# Patient Record
Sex: Male | Born: 1935
Health system: Southern US, Community
[De-identification: ages and names within clinical notes are randomized; demographics above are authoritative.]

## PROBLEM LIST (undated history)

## (undated) DIAGNOSIS — J449 Chronic obstructive pulmonary disease, unspecified: Secondary | ICD-10-CM

## (undated) DIAGNOSIS — G459 Transient cerebral ischemic attack, unspecified: Secondary | ICD-10-CM

## (undated) DIAGNOSIS — I2510 Atherosclerotic heart disease of native coronary artery without angina pectoris: Secondary | ICD-10-CM

## (undated) DIAGNOSIS — N183 Chronic kidney disease, stage 3 unspecified: Secondary | ICD-10-CM

## (undated) DIAGNOSIS — I4891 Unspecified atrial fibrillation: Secondary | ICD-10-CM

## (undated) DIAGNOSIS — Z952 Presence of prosthetic heart valve: Secondary | ICD-10-CM

## (undated) DIAGNOSIS — Z8546 Personal history of malignant neoplasm of prostate: Secondary | ICD-10-CM

## (undated) DIAGNOSIS — E041 Nontoxic single thyroid nodule: Secondary | ICD-10-CM

## (undated) DIAGNOSIS — D119 Benign neoplasm of major salivary gland, unspecified: Secondary | ICD-10-CM

## (undated) DIAGNOSIS — J9611 Chronic respiratory failure with hypoxia: Secondary | ICD-10-CM

## (undated) DIAGNOSIS — J189 Pneumonia, unspecified organism: Secondary | ICD-10-CM

## (undated) DIAGNOSIS — I639 Cerebral infarction, unspecified: Secondary | ICD-10-CM

## (undated) DIAGNOSIS — D696 Thrombocytopenia, unspecified: Secondary | ICD-10-CM

## (undated) DIAGNOSIS — R590 Localized enlarged lymph nodes: Secondary | ICD-10-CM

## (undated) DIAGNOSIS — I10 Essential (primary) hypertension: Secondary | ICD-10-CM

## (undated) DIAGNOSIS — D3500 Benign neoplasm of unspecified adrenal gland: Secondary | ICD-10-CM

## (undated) DIAGNOSIS — R911 Solitary pulmonary nodule: Secondary | ICD-10-CM

## (undated) DIAGNOSIS — K5790 Diverticulosis of intestine, part unspecified, without perforation or abscess without bleeding: Secondary | ICD-10-CM

## (undated) DIAGNOSIS — I5032 Chronic diastolic (congestive) heart failure: Secondary | ICD-10-CM

## (undated) DIAGNOSIS — C801 Malignant (primary) neoplasm, unspecified: Secondary | ICD-10-CM

## (undated) DIAGNOSIS — E785 Hyperlipidemia, unspecified: Secondary | ICD-10-CM

## (undated) DIAGNOSIS — E119 Type 2 diabetes mellitus without complications: Secondary | ICD-10-CM

## (undated) DIAGNOSIS — I272 Pulmonary hypertension, unspecified: Secondary | ICD-10-CM

## (undated) HISTORY — DX: Chronic kidney disease, stage 3 (moderate): N18.3

## (undated) HISTORY — DX: Unspecified atrial fibrillation: I48.91

## (undated) HISTORY — PX: CATARACT EXTRACTION, BILATERAL: SHX1313

## (undated) HISTORY — DX: Transient cerebral ischemic attack, unspecified: G45.9

## (undated) HISTORY — PX: TRANSTHORACIC ECHOCARDIOGRAM: SHX275

## (undated) HISTORY — DX: Essential (primary) hypertension: I10

## (undated) HISTORY — DX: Type 2 diabetes mellitus without complications: E11.9

## (undated) HISTORY — DX: Cerebral infarction, unspecified: I63.9

## (undated) HISTORY — DX: Chronic obstructive pulmonary disease, unspecified: J44.9

## (undated) HISTORY — DX: Diverticulosis of intestine, part unspecified, without perforation or abscess without bleeding: K57.90

## (undated) HISTORY — DX: Presence of prosthetic heart valve: Z95.2

## (undated) HISTORY — DX: Benign neoplasm of major salivary gland, unspecified: D11.9

## (undated) HISTORY — DX: Pulmonary hypertension, unspecified: I27.20

## (undated) HISTORY — DX: Localized enlarged lymph nodes: R59.0

## (undated) HISTORY — DX: Chronic kidney disease, stage 3 unspecified: N18.30

## (undated) HISTORY — DX: Thrombocytopenia, unspecified: D69.6

## (undated) HISTORY — DX: Pneumonia, unspecified organism: J18.9

## (undated) HISTORY — DX: Chronic diastolic (congestive) heart failure: I50.32

## (undated) HISTORY — PX: FLEXIBLE SIGMOIDOSCOPY: SHX1649

## (undated) HISTORY — PX: SALIVARY GLAND SURGERY: SHX768

## (undated) HISTORY — DX: Atherosclerotic heart disease of native coronary artery without angina pectoris: I25.10

## (undated) HISTORY — DX: Solitary pulmonary nodule: R91.1

## (undated) HISTORY — DX: Hyperlipidemia, unspecified: E78.5

## (undated) HISTORY — DX: Benign neoplasm of unspecified adrenal gland: D35.00

## (undated) HISTORY — DX: Personal history of malignant neoplasm of prostate: Z85.46

## (undated) HISTORY — DX: Chronic respiratory failure with hypoxia: J96.11

## (undated) HISTORY — PX: INGUINAL HERNIA REPAIR: SHX194

## (undated) HISTORY — DX: Nontoxic single thyroid nodule: E04.1

---

## 1999-07-28 ENCOUNTER — Ambulatory Visit (HOSPITAL_COMMUNITY): Admission: RE | Admit: 1999-07-28 | Discharge: 1999-07-28 | Payer: Self-pay | Admitting: Internal Medicine

## 1999-07-28 ENCOUNTER — Encounter: Payer: Self-pay | Admitting: Internal Medicine

## 1999-11-18 ENCOUNTER — Ambulatory Visit (HOSPITAL_COMMUNITY): Admission: RE | Admit: 1999-11-18 | Discharge: 1999-11-18 | Payer: Self-pay | Admitting: Internal Medicine

## 1999-11-18 ENCOUNTER — Encounter: Payer: Self-pay | Admitting: Internal Medicine

## 1999-11-30 ENCOUNTER — Ambulatory Visit (HOSPITAL_BASED_OUTPATIENT_CLINIC_OR_DEPARTMENT_OTHER): Admission: RE | Admit: 1999-11-30 | Discharge: 1999-11-30 | Payer: Self-pay | Admitting: General Surgery

## 2002-08-03 ENCOUNTER — Encounter: Payer: Self-pay | Admitting: *Deleted

## 2002-08-03 ENCOUNTER — Observation Stay (HOSPITAL_COMMUNITY): Admission: EM | Admit: 2002-08-03 | Discharge: 2002-08-04 | Payer: Self-pay | Admitting: *Deleted

## 2002-11-06 ENCOUNTER — Encounter: Admission: RE | Admit: 2002-11-06 | Discharge: 2002-11-06 | Payer: Self-pay | Admitting: Internal Medicine

## 2002-11-06 ENCOUNTER — Encounter: Payer: Self-pay | Admitting: Internal Medicine

## 2003-06-20 HISTORY — PX: PROSTATECTOMY: SHX69

## 2003-07-10 ENCOUNTER — Encounter: Payer: Self-pay | Admitting: Urology

## 2003-07-14 ENCOUNTER — Encounter (INDEPENDENT_AMBULATORY_CARE_PROVIDER_SITE_OTHER): Payer: Self-pay

## 2003-07-14 ENCOUNTER — Inpatient Hospital Stay (HOSPITAL_COMMUNITY): Admission: RE | Admit: 2003-07-14 | Discharge: 2003-07-17 | Payer: Self-pay | Admitting: Urology

## 2004-09-14 ENCOUNTER — Ambulatory Visit: Payer: Self-pay | Admitting: Internal Medicine

## 2004-09-14 ENCOUNTER — Inpatient Hospital Stay (HOSPITAL_COMMUNITY): Admission: AD | Admit: 2004-09-14 | Discharge: 2004-09-29 | Payer: Self-pay | Admitting: Internal Medicine

## 2004-09-14 ENCOUNTER — Ambulatory Visit: Payer: Self-pay | Admitting: Cardiology

## 2004-09-15 ENCOUNTER — Encounter: Payer: Self-pay | Admitting: Cardiology

## 2004-09-22 ENCOUNTER — Encounter (INDEPENDENT_AMBULATORY_CARE_PROVIDER_SITE_OTHER): Payer: Self-pay | Admitting: *Deleted

## 2004-09-22 HISTORY — PX: MITRAL VALVE REPAIR: SHX2039

## 2004-10-20 ENCOUNTER — Ambulatory Visit: Payer: Self-pay | Admitting: Cardiology

## 2004-11-01 ENCOUNTER — Ambulatory Visit: Payer: Self-pay | Admitting: Cardiology

## 2004-11-01 ENCOUNTER — Ambulatory Visit: Payer: Self-pay

## 2004-11-01 ENCOUNTER — Ambulatory Visit: Payer: Self-pay | Admitting: Internal Medicine

## 2004-11-08 ENCOUNTER — Ambulatory Visit: Payer: Self-pay | Admitting: *Deleted

## 2004-11-18 ENCOUNTER — Ambulatory Visit: Payer: Self-pay | Admitting: Cardiology

## 2004-12-06 ENCOUNTER — Ambulatory Visit: Payer: Self-pay | Admitting: Internal Medicine

## 2004-12-09 ENCOUNTER — Ambulatory Visit: Payer: Self-pay | Admitting: Cardiology

## 2004-12-16 ENCOUNTER — Ambulatory Visit: Payer: Self-pay | Admitting: Internal Medicine

## 2004-12-23 ENCOUNTER — Ambulatory Visit: Payer: Self-pay | Admitting: Internal Medicine

## 2005-01-13 ENCOUNTER — Encounter
Admission: RE | Admit: 2005-01-13 | Discharge: 2005-01-13 | Payer: Self-pay | Admitting: Thoracic Surgery (Cardiothoracic Vascular Surgery)

## 2005-01-25 ENCOUNTER — Ambulatory Visit: Payer: Self-pay | Admitting: Cardiology

## 2005-02-04 ENCOUNTER — Ambulatory Visit: Payer: Self-pay | Admitting: Cardiovascular Disease

## 2005-02-08 ENCOUNTER — Ambulatory Visit: Payer: Self-pay | Admitting: Internal Medicine

## 2005-02-11 ENCOUNTER — Emergency Department (HOSPITAL_COMMUNITY): Admission: EM | Admit: 2005-02-11 | Discharge: 2005-02-11 | Payer: Self-pay | Admitting: Emergency Medicine

## 2005-02-12 ENCOUNTER — Ambulatory Visit: Payer: Self-pay | Admitting: Internal Medicine

## 2005-02-13 ENCOUNTER — Inpatient Hospital Stay (HOSPITAL_COMMUNITY): Admission: EM | Admit: 2005-02-13 | Discharge: 2005-03-01 | Payer: Self-pay | Admitting: Emergency Medicine

## 2005-02-13 ENCOUNTER — Ambulatory Visit: Payer: Self-pay | Admitting: Physical Medicine & Rehabilitation

## 2005-02-13 ENCOUNTER — Ambulatory Visit: Payer: Self-pay | Admitting: Internal Medicine

## 2005-02-15 ENCOUNTER — Ambulatory Visit: Payer: Self-pay | Admitting: Oncology

## 2005-02-16 ENCOUNTER — Ambulatory Visit: Payer: Self-pay | Admitting: Pulmonary Disease

## 2005-02-16 ENCOUNTER — Encounter: Payer: Self-pay | Admitting: Cardiology

## 2005-02-18 ENCOUNTER — Encounter (INDEPENDENT_AMBULATORY_CARE_PROVIDER_SITE_OTHER): Payer: Self-pay | Admitting: *Deleted

## 2005-02-25 ENCOUNTER — Ambulatory Visit: Payer: Self-pay | Admitting: Cardiology

## 2005-03-01 ENCOUNTER — Inpatient Hospital Stay (HOSPITAL_COMMUNITY)
Admission: RE | Admit: 2005-03-01 | Discharge: 2005-03-08 | Payer: Self-pay | Admitting: Physical Medicine & Rehabilitation

## 2005-03-01 ENCOUNTER — Ambulatory Visit: Payer: Self-pay | Admitting: Physical Medicine & Rehabilitation

## 2005-03-18 ENCOUNTER — Ambulatory Visit: Payer: Self-pay | Admitting: Cardiology

## 2005-03-24 ENCOUNTER — Ambulatory Visit: Payer: Self-pay | Admitting: Internal Medicine

## 2005-03-25 ENCOUNTER — Encounter
Admission: RE | Admit: 2005-03-25 | Discharge: 2005-03-25 | Payer: Self-pay | Admitting: Thoracic Surgery (Cardiothoracic Vascular Surgery)

## 2005-03-30 ENCOUNTER — Ambulatory Visit: Payer: Self-pay | Admitting: Infectious Diseases

## 2005-03-30 ENCOUNTER — Ambulatory Visit: Payer: Self-pay | Admitting: Internal Medicine

## 2005-04-04 ENCOUNTER — Ambulatory Visit: Payer: Self-pay | Admitting: Infectious Diseases

## 2005-04-05 ENCOUNTER — Ambulatory Visit: Payer: Self-pay | Admitting: Internal Medicine

## 2005-04-07 ENCOUNTER — Ambulatory Visit: Payer: Self-pay | Admitting: Cardiology

## 2005-04-11 ENCOUNTER — Ambulatory Visit: Payer: Self-pay | Admitting: Infectious Diseases

## 2005-04-11 ENCOUNTER — Encounter: Payer: Self-pay | Admitting: Cardiology

## 2005-04-11 ENCOUNTER — Ambulatory Visit (HOSPITAL_COMMUNITY): Admission: RE | Admit: 2005-04-11 | Discharge: 2005-04-11 | Payer: Self-pay | Admitting: Cardiology

## 2005-04-11 ENCOUNTER — Ambulatory Visit: Payer: Self-pay | Admitting: Cardiology

## 2005-04-12 ENCOUNTER — Ambulatory Visit: Payer: Self-pay | Admitting: Internal Medicine

## 2005-04-18 ENCOUNTER — Ambulatory Visit: Payer: Self-pay | Admitting: Cardiology

## 2005-04-21 ENCOUNTER — Ambulatory Visit: Payer: Self-pay | Admitting: Cardiology

## 2005-05-02 ENCOUNTER — Ambulatory Visit: Payer: Self-pay | Admitting: Cardiology

## 2005-05-16 ENCOUNTER — Ambulatory Visit: Payer: Self-pay

## 2005-05-16 ENCOUNTER — Ambulatory Visit: Payer: Self-pay | Admitting: Cardiology

## 2005-06-01 ENCOUNTER — Ambulatory Visit: Payer: Self-pay | Admitting: Infectious Diseases

## 2005-06-08 ENCOUNTER — Ambulatory Visit: Payer: Self-pay | Admitting: Internal Medicine

## 2005-06-13 ENCOUNTER — Ambulatory Visit: Payer: Self-pay | Admitting: Internal Medicine

## 2005-06-22 ENCOUNTER — Ambulatory Visit: Payer: Self-pay | Admitting: Cardiology

## 2005-06-27 ENCOUNTER — Encounter: Admission: RE | Admit: 2005-06-27 | Discharge: 2005-06-27 | Payer: Self-pay | Admitting: Internal Medicine

## 2005-07-06 ENCOUNTER — Ambulatory Visit: Payer: Self-pay | Admitting: Cardiology

## 2005-07-09 ENCOUNTER — Emergency Department (HOSPITAL_COMMUNITY): Admission: EM | Admit: 2005-07-09 | Discharge: 2005-07-09 | Payer: Self-pay | Admitting: Emergency Medicine

## 2005-07-10 ENCOUNTER — Ambulatory Visit (HOSPITAL_COMMUNITY): Admission: RE | Admit: 2005-07-10 | Discharge: 2005-07-10 | Payer: Self-pay | Admitting: Emergency Medicine

## 2005-07-13 ENCOUNTER — Ambulatory Visit: Payer: Self-pay | Admitting: Internal Medicine

## 2005-07-18 ENCOUNTER — Ambulatory Visit: Payer: Self-pay | Admitting: Internal Medicine

## 2005-07-26 ENCOUNTER — Ambulatory Visit: Payer: Self-pay

## 2005-08-02 ENCOUNTER — Ambulatory Visit: Payer: Self-pay | Admitting: Internal Medicine

## 2005-08-23 ENCOUNTER — Ambulatory Visit: Payer: Self-pay | Admitting: Cardiology

## 2005-08-31 ENCOUNTER — Encounter: Admission: RE | Admit: 2005-08-31 | Discharge: 2005-08-31 | Payer: Self-pay | Admitting: Neurology

## 2005-09-05 ENCOUNTER — Ambulatory Visit: Payer: Self-pay | Admitting: Cardiology

## 2005-09-06 ENCOUNTER — Encounter: Payer: Self-pay | Admitting: Cardiology

## 2005-09-06 ENCOUNTER — Ambulatory Visit: Payer: Self-pay | Admitting: Cardiology

## 2005-09-06 ENCOUNTER — Ambulatory Visit (HOSPITAL_COMMUNITY): Admission: RE | Admit: 2005-09-06 | Discharge: 2005-09-06 | Payer: Self-pay | Admitting: Cardiology

## 2005-09-09 ENCOUNTER — Inpatient Hospital Stay (HOSPITAL_COMMUNITY): Admission: AD | Admit: 2005-09-09 | Discharge: 2005-09-29 | Payer: Self-pay | Admitting: Cardiology

## 2005-09-14 ENCOUNTER — Ambulatory Visit: Payer: Self-pay | Admitting: Internal Medicine

## 2005-09-14 ENCOUNTER — Encounter (INDEPENDENT_AMBULATORY_CARE_PROVIDER_SITE_OTHER): Payer: Self-pay | Admitting: *Deleted

## 2005-09-14 HISTORY — PX: MITRAL VALVE REPAIR: SHX2039

## 2005-10-05 ENCOUNTER — Ambulatory Visit: Payer: Self-pay | Admitting: Cardiology

## 2005-10-11 ENCOUNTER — Encounter: Payer: Self-pay | Admitting: Cardiology

## 2005-10-11 ENCOUNTER — Ambulatory Visit: Payer: Self-pay | Admitting: Cardiology

## 2005-10-11 ENCOUNTER — Ambulatory Visit: Payer: Self-pay

## 2005-10-17 ENCOUNTER — Ambulatory Visit: Payer: Self-pay

## 2005-10-17 ENCOUNTER — Ambulatory Visit: Payer: Self-pay | Admitting: Infectious Diseases

## 2005-10-21 ENCOUNTER — Ambulatory Visit: Payer: Self-pay | Admitting: Internal Medicine

## 2005-10-24 ENCOUNTER — Ambulatory Visit: Payer: Self-pay | Admitting: Internal Medicine

## 2005-11-02 ENCOUNTER — Ambulatory Visit: Payer: Self-pay | Admitting: Internal Medicine

## 2005-11-02 ENCOUNTER — Ambulatory Visit: Payer: Self-pay | Admitting: Infectious Diseases

## 2005-11-10 ENCOUNTER — Ambulatory Visit: Payer: Self-pay | Admitting: Cardiology

## 2005-11-17 ENCOUNTER — Ambulatory Visit: Payer: Self-pay | Admitting: Cardiology

## 2005-11-21 ENCOUNTER — Ambulatory Visit: Payer: Self-pay | Admitting: Infectious Diseases

## 2005-12-05 ENCOUNTER — Ambulatory Visit: Payer: Self-pay | Admitting: Infectious Diseases

## 2005-12-05 ENCOUNTER — Ambulatory Visit: Payer: Self-pay | Admitting: Internal Medicine

## 2005-12-09 ENCOUNTER — Ambulatory Visit: Payer: Self-pay | Admitting: Cardiology

## 2005-12-19 ENCOUNTER — Ambulatory Visit: Payer: Self-pay | Admitting: Cardiology

## 2006-01-04 ENCOUNTER — Ambulatory Visit: Payer: Self-pay | Admitting: *Deleted

## 2006-01-04 ENCOUNTER — Ambulatory Visit: Payer: Self-pay | Admitting: Infectious Diseases

## 2006-01-06 ENCOUNTER — Ambulatory Visit: Payer: Self-pay | Admitting: Internal Medicine

## 2006-01-09 ENCOUNTER — Ambulatory Visit: Payer: Self-pay | Admitting: Cardiology

## 2006-01-18 ENCOUNTER — Ambulatory Visit: Payer: Self-pay | Admitting: Cardiology

## 2006-02-01 ENCOUNTER — Ambulatory Visit: Payer: Self-pay | Admitting: Cardiology

## 2006-02-06 ENCOUNTER — Ambulatory Visit: Payer: Self-pay | Admitting: Infectious Diseases

## 2006-02-16 ENCOUNTER — Ambulatory Visit: Payer: Self-pay | Admitting: Cardiology

## 2006-03-07 ENCOUNTER — Ambulatory Visit: Payer: Self-pay | Admitting: Cardiology

## 2006-03-23 ENCOUNTER — Ambulatory Visit: Payer: Self-pay | Admitting: Cardiology

## 2006-03-23 ENCOUNTER — Ambulatory Visit: Payer: Self-pay | Admitting: Internal Medicine

## 2006-03-27 ENCOUNTER — Ambulatory Visit: Payer: Self-pay | Admitting: Cardiology

## 2006-03-27 ENCOUNTER — Ambulatory Visit (HOSPITAL_COMMUNITY): Admission: RE | Admit: 2006-03-27 | Discharge: 2006-03-27 | Payer: Self-pay | Admitting: Cardiology

## 2006-03-31 ENCOUNTER — Ambulatory Visit: Payer: Self-pay | Admitting: Cardiology

## 2006-04-19 ENCOUNTER — Ambulatory Visit: Payer: Self-pay | Admitting: Cardiology

## 2006-05-09 ENCOUNTER — Ambulatory Visit: Payer: Self-pay | Admitting: Cardiology

## 2006-05-18 ENCOUNTER — Ambulatory Visit: Payer: Self-pay | Admitting: Cardiology

## 2006-06-01 ENCOUNTER — Ambulatory Visit: Payer: Self-pay | Admitting: Cardiology

## 2006-06-22 ENCOUNTER — Ambulatory Visit: Payer: Self-pay | Admitting: Cardiology

## 2006-07-04 DIAGNOSIS — I38 Endocarditis, valve unspecified: Secondary | ICD-10-CM | POA: Insufficient documentation

## 2006-07-06 ENCOUNTER — Ambulatory Visit: Payer: Self-pay | Admitting: Internal Medicine

## 2006-07-19 ENCOUNTER — Ambulatory Visit: Payer: Self-pay | Admitting: Internal Medicine

## 2006-07-27 ENCOUNTER — Ambulatory Visit: Payer: Self-pay | Admitting: Cardiology

## 2006-08-08 ENCOUNTER — Ambulatory Visit: Payer: Self-pay | Admitting: Cardiovascular Disease

## 2006-09-05 ENCOUNTER — Ambulatory Visit: Payer: Self-pay | Admitting: Cardiology

## 2006-09-21 ENCOUNTER — Ambulatory Visit: Payer: Self-pay | Admitting: Cardiology

## 2006-09-29 ENCOUNTER — Ambulatory Visit: Payer: Self-pay | Admitting: Internal Medicine

## 2006-10-12 ENCOUNTER — Ambulatory Visit: Payer: Self-pay | Admitting: Internal Medicine

## 2006-10-20 ENCOUNTER — Ambulatory Visit: Payer: Self-pay | Admitting: Internal Medicine

## 2006-11-17 ENCOUNTER — Ambulatory Visit: Payer: Self-pay | Admitting: Cardiology

## 2006-12-15 ENCOUNTER — Ambulatory Visit: Payer: Self-pay | Admitting: Cardiology

## 2006-12-18 ENCOUNTER — Ambulatory Visit: Payer: Self-pay | Admitting: Internal Medicine

## 2006-12-29 ENCOUNTER — Ambulatory Visit: Payer: Self-pay | Admitting: Internal Medicine

## 2007-01-04 ENCOUNTER — Ambulatory Visit: Payer: Self-pay | Admitting: Internal Medicine

## 2007-01-12 ENCOUNTER — Ambulatory Visit: Payer: Self-pay | Admitting: Cardiology

## 2007-01-12 DIAGNOSIS — Z8546 Personal history of malignant neoplasm of prostate: Secondary | ICD-10-CM

## 2007-02-02 ENCOUNTER — Ambulatory Visit: Payer: Self-pay | Admitting: Cardiology

## 2007-02-02 ENCOUNTER — Ambulatory Visit: Payer: Self-pay | Admitting: Internal Medicine

## 2007-02-02 ENCOUNTER — Encounter: Payer: Self-pay | Admitting: Internal Medicine

## 2007-02-05 ENCOUNTER — Ambulatory Visit (HOSPITAL_COMMUNITY): Admission: RE | Admit: 2007-02-05 | Discharge: 2007-02-05 | Payer: Self-pay | Admitting: Cardiology

## 2007-02-05 ENCOUNTER — Ambulatory Visit: Payer: Self-pay | Admitting: Cardiology

## 2007-02-08 ENCOUNTER — Ambulatory Visit: Payer: Self-pay | Admitting: Internal Medicine

## 2007-02-09 ENCOUNTER — Ambulatory Visit: Payer: Self-pay | Admitting: Cardiovascular Disease

## 2007-02-13 ENCOUNTER — Ambulatory Visit: Payer: Self-pay | Admitting: Internal Medicine

## 2007-02-13 ENCOUNTER — Encounter: Payer: Self-pay | Admitting: Internal Medicine

## 2007-02-13 LAB — CONVERTED CEMR LAB: INR: 3.3

## 2007-02-15 ENCOUNTER — Ambulatory Visit: Payer: Self-pay | Admitting: Internal Medicine

## 2007-02-16 ENCOUNTER — Ambulatory Visit: Payer: Self-pay | Admitting: Cardiology

## 2007-02-16 ENCOUNTER — Ambulatory Visit: Payer: Self-pay

## 2007-02-16 ENCOUNTER — Encounter: Payer: Self-pay | Admitting: Cardiology

## 2007-03-02 ENCOUNTER — Ambulatory Visit: Payer: Self-pay | Admitting: Cardiology

## 2007-03-22 ENCOUNTER — Encounter: Payer: Self-pay | Admitting: Internal Medicine

## 2007-03-30 ENCOUNTER — Ambulatory Visit: Payer: Self-pay | Admitting: Cardiology

## 2007-04-26 ENCOUNTER — Ambulatory Visit: Payer: Self-pay | Admitting: Internal Medicine

## 2007-05-03 ENCOUNTER — Ambulatory Visit: Payer: Self-pay | Admitting: Cardiology

## 2007-05-03 LAB — CONVERTED CEMR LAB
BUN: 19 mg/dL (ref 6–23)
Chloride: 109 meq/L (ref 96–112)
Creatinine, Ser: 1.4 mg/dL (ref 0.4–1.5)
Potassium: 4.4 meq/L (ref 3.5–5.1)

## 2007-05-24 ENCOUNTER — Ambulatory Visit: Payer: Self-pay | Admitting: Cardiology

## 2007-06-21 ENCOUNTER — Ambulatory Visit: Payer: Self-pay | Admitting: Cardiology

## 2007-07-19 ENCOUNTER — Ambulatory Visit: Payer: Self-pay | Admitting: Cardiology

## 2007-08-17 ENCOUNTER — Ambulatory Visit: Payer: Self-pay | Admitting: Cardiovascular Disease

## 2007-09-03 ENCOUNTER — Ambulatory Visit: Payer: Self-pay | Admitting: Internal Medicine

## 2007-09-03 DIAGNOSIS — R609 Edema, unspecified: Secondary | ICD-10-CM

## 2007-09-21 ENCOUNTER — Ambulatory Visit: Payer: Self-pay | Admitting: Cardiovascular Disease

## 2007-10-19 ENCOUNTER — Ambulatory Visit: Payer: Self-pay | Admitting: Cardiology

## 2007-11-01 ENCOUNTER — Ambulatory Visit: Payer: Self-pay | Admitting: Cardiology

## 2007-11-01 LAB — CONVERTED CEMR LAB
Albumin: 3.6 g/dL (ref 3.5–5.2)
Alkaline Phosphatase: 58 units/L (ref 39–117)
BUN: 15 mg/dL (ref 6–23)
Basophils Absolute: 0.1 10*3/uL (ref 0.0–0.1)
Cholesterol: 98 mg/dL (ref 0–200)
Creatinine, Ser: 1.4 mg/dL (ref 0.4–1.5)
Eosinophils Absolute: 0.2 10*3/uL (ref 0.0–0.6)
GFR calc Af Amer: 65 mL/min
GFR calc non Af Amer: 53 mL/min
HDL: 26.4 mg/dL — ABNORMAL LOW (ref >39.0)
Hemoglobin: 15.5 g/dL (ref 13.0–17.0)
LDL Cholesterol: 38 mg/dL (ref 0–99)
MCHC: 33.1 g/dL (ref 30.0–36.0)
Monocytes Absolute: 0.9 10*3/uL — ABNORMAL HIGH (ref 0.2–0.7)
Monocytes Relative: 11.9 % — ABNORMAL HIGH (ref 3.0–11.0)
Potassium: 4 meq/L (ref 3.5–5.1)
RDW: 13.2 % (ref 11.5–14.6)
Total Bilirubin: 0.9 mg/dL (ref 0.3–1.2)
Total CHOL/HDL Ratio: 3.7

## 2007-11-09 ENCOUNTER — Ambulatory Visit: Payer: Self-pay | Admitting: Cardiology

## 2007-11-21 ENCOUNTER — Inpatient Hospital Stay (HOSPITAL_COMMUNITY): Admission: EM | Admit: 2007-11-21 | Discharge: 2007-11-28 | Payer: Self-pay | Admitting: Emergency Medicine

## 2007-11-21 ENCOUNTER — Ambulatory Visit: Payer: Self-pay | Admitting: Internal Medicine

## 2007-11-22 ENCOUNTER — Encounter: Payer: Self-pay | Admitting: Internal Medicine

## 2007-11-26 ENCOUNTER — Encounter: Payer: Self-pay | Admitting: Internal Medicine

## 2007-12-04 ENCOUNTER — Telehealth (INDEPENDENT_AMBULATORY_CARE_PROVIDER_SITE_OTHER): Payer: Self-pay | Admitting: *Deleted

## 2007-12-04 ENCOUNTER — Ambulatory Visit: Payer: Self-pay | Admitting: Internal Medicine

## 2007-12-04 DIAGNOSIS — J449 Chronic obstructive pulmonary disease, unspecified: Secondary | ICD-10-CM

## 2007-12-07 ENCOUNTER — Telehealth (INDEPENDENT_AMBULATORY_CARE_PROVIDER_SITE_OTHER): Payer: Self-pay | Admitting: *Deleted

## 2007-12-07 ENCOUNTER — Ambulatory Visit: Payer: Self-pay | Admitting: Cardiology

## 2007-12-12 ENCOUNTER — Ambulatory Visit: Payer: Self-pay | Admitting: Internal Medicine

## 2007-12-12 DIAGNOSIS — K5150 Left sided colitis without complications: Secondary | ICD-10-CM

## 2007-12-17 ENCOUNTER — Ambulatory Visit: Payer: Self-pay | Admitting: Cardiology

## 2007-12-17 ENCOUNTER — Encounter: Payer: Self-pay | Admitting: Pulmonary Disease

## 2007-12-17 ENCOUNTER — Inpatient Hospital Stay (HOSPITAL_COMMUNITY): Admission: AD | Admit: 2007-12-17 | Discharge: 2007-12-20 | Payer: Self-pay | Admitting: Internal Medicine

## 2007-12-17 ENCOUNTER — Ambulatory Visit: Payer: Self-pay | Admitting: Internal Medicine

## 2007-12-18 ENCOUNTER — Encounter (INDEPENDENT_AMBULATORY_CARE_PROVIDER_SITE_OTHER): Payer: Self-pay | Admitting: *Deleted

## 2007-12-19 ENCOUNTER — Encounter: Payer: Self-pay | Admitting: Internal Medicine

## 2007-12-19 HISTORY — PX: COLONOSCOPY W/ BIOPSIES: SHX1374

## 2007-12-20 ENCOUNTER — Ambulatory Visit: Payer: Self-pay | Admitting: Internal Medicine

## 2007-12-25 ENCOUNTER — Ambulatory Visit: Payer: Self-pay | Admitting: Internal Medicine

## 2008-01-09 ENCOUNTER — Encounter: Payer: Self-pay | Admitting: Internal Medicine

## 2008-01-09 ENCOUNTER — Ambulatory Visit: Payer: Self-pay | Admitting: Internal Medicine

## 2008-01-09 DIAGNOSIS — K5900 Constipation, unspecified: Secondary | ICD-10-CM | POA: Insufficient documentation

## 2008-01-09 DIAGNOSIS — K5289 Other specified noninfective gastroenteritis and colitis: Secondary | ICD-10-CM

## 2008-01-11 ENCOUNTER — Encounter: Payer: Self-pay | Admitting: Internal Medicine

## 2008-01-11 ENCOUNTER — Ambulatory Visit: Payer: Self-pay | Admitting: Internal Medicine

## 2008-01-11 LAB — CONVERTED CEMR LAB
Basophils Absolute: 0 10*3/uL (ref 0.0–0.1)
Basophils Relative: 0.4 % (ref 0.0–1.0)
Eosinophils Absolute: 0.2 10*3/uL (ref 0.0–0.7)
Eosinophils Relative: 3 % (ref 0.0–5.0)
HCT: 36.5 % — ABNORMAL LOW (ref 39.0–52.0)
Hemoglobin: 11.9 g/dL — ABNORMAL LOW (ref 13.0–17.0)
MCV: 88.6 fL (ref 78.0–100.0)
Monocytes Absolute: 0.7 10*3/uL (ref 0.1–1.0)
Neutro Abs: 2.7 10*3/uL (ref 1.4–7.7)
RBC: 4.12 M/uL — ABNORMAL LOW (ref 4.22–5.81)
WBC: 5.8 10*3/uL (ref 4.5–10.5)

## 2008-01-17 ENCOUNTER — Ambulatory Visit: Payer: Self-pay | Admitting: Cardiology

## 2008-01-21 ENCOUNTER — Ambulatory Visit: Payer: Self-pay | Admitting: Internal Medicine

## 2008-01-21 ENCOUNTER — Ambulatory Visit: Payer: Self-pay | Admitting: Pulmonary Disease

## 2008-01-21 ENCOUNTER — Ambulatory Visit: Payer: Self-pay | Admitting: Cardiovascular Disease

## 2008-01-21 DIAGNOSIS — R042 Hemoptysis: Secondary | ICD-10-CM | POA: Insufficient documentation

## 2008-01-28 ENCOUNTER — Telehealth (INDEPENDENT_AMBULATORY_CARE_PROVIDER_SITE_OTHER): Payer: Self-pay | Admitting: *Deleted

## 2008-01-28 LAB — CONVERTED CEMR LAB
BUN: 10 mg/dL (ref 6–23)
Basophils Absolute: 0.1 10*3/uL (ref 0.0–0.1)
Basophils Relative: 0.8 % (ref 0.0–1.0)
Chloride: 107 meq/L (ref 96–112)
Creatinine, Ser: 1.3 mg/dL (ref 0.4–1.5)
Eosinophils Absolute: 0.2 10*3/uL (ref 0.0–0.7)
GFR calc Af Amer: 70 mL/min
GFR calc non Af Amer: 58 mL/min
HCT: 36.4 % — ABNORMAL LOW (ref 39.0–52.0)
MCHC: 32.7 g/dL (ref 30.0–36.0)
MCV: 86.7 fL (ref 78.0–100.0)
Monocytes Absolute: 0.8 10*3/uL (ref 0.1–1.0)
Neutrophils Relative %: 42.1 % — ABNORMAL LOW (ref 43.0–77.0)
Platelets: 157 10*3/uL (ref 150–400)
Potassium: 3.8 meq/L (ref 3.5–5.1)
aPTT: 29.1 s (ref 21.7–29.8)

## 2008-01-30 ENCOUNTER — Ambulatory Visit: Payer: Self-pay | Admitting: Pulmonary Disease

## 2008-01-30 DIAGNOSIS — R599 Enlarged lymph nodes, unspecified: Secondary | ICD-10-CM | POA: Insufficient documentation

## 2008-01-30 DIAGNOSIS — R930 Abnormal findings on diagnostic imaging of skull and head, not elsewhere classified: Secondary | ICD-10-CM

## 2008-02-01 ENCOUNTER — Encounter: Payer: Self-pay | Admitting: Cardiology

## 2008-02-01 LAB — CONVERTED CEMR LAB
BUN: 13 mg/dL (ref 6–23)
Calcium: 9.6 mg/dL (ref 8.4–10.5)
GFR calc Af Amer: 77 mL/min
GFR calc non Af Amer: 64 mL/min
Glucose, Bld: 117 mg/dL — ABNORMAL HIGH (ref 70–99)
Potassium: 3.8 meq/L (ref 3.5–5.1)

## 2008-02-08 ENCOUNTER — Ambulatory Visit: Payer: Self-pay | Admitting: Cardiology

## 2008-02-08 LAB — CONVERTED CEMR LAB
Basophils Absolute: 0.1 10*3/uL (ref 0.0–0.1)
CO2: 29 meq/L (ref 19–32)
Calcium: 9.4 mg/dL (ref 8.4–10.5)
Chloride: 106 meq/L (ref 96–112)
Glucose, Bld: 122 mg/dL — ABNORMAL HIGH (ref 70–99)
Hemoglobin: 13 g/dL (ref 13.0–17.0)
Lymphocytes Relative: 41.5 % (ref 12.0–46.0)
MCHC: 33 g/dL (ref 30.0–36.0)
Monocytes Relative: 14.1 % — ABNORMAL HIGH (ref 3.0–12.0)
Neutro Abs: 2.6 10*3/uL (ref 1.4–7.7)
Neutrophils Relative %: 41 % — ABNORMAL LOW (ref 43.0–77.0)
Platelets: 158 10*3/uL (ref 150–400)
Potassium: 3.7 meq/L (ref 3.5–5.1)
Pro B Natriuretic peptide (BNP): 49 pg/mL (ref 0.0–100.0)
RDW: 14.6 % (ref 11.5–14.6)
Sodium: 138 meq/L (ref 135–145)

## 2008-02-19 ENCOUNTER — Encounter: Payer: Self-pay | Admitting: Cardiology

## 2008-02-19 ENCOUNTER — Ambulatory Visit: Payer: Self-pay

## 2008-02-19 ENCOUNTER — Ambulatory Visit: Payer: Self-pay | Admitting: Cardiology

## 2008-02-26 ENCOUNTER — Ambulatory Visit: Payer: Self-pay | Admitting: Internal Medicine

## 2008-03-11 ENCOUNTER — Ambulatory Visit: Payer: Self-pay | Admitting: Cardiology

## 2008-04-01 ENCOUNTER — Ambulatory Visit: Payer: Self-pay | Admitting: Cardiology

## 2008-04-23 ENCOUNTER — Ambulatory Visit: Payer: Self-pay | Admitting: Cardiology

## 2008-04-23 LAB — CONVERTED CEMR LAB
Basophils Absolute: 0 10*3/uL (ref 0.0–0.1)
Basophils Relative: 0.3 % (ref 0.0–3.0)
Eosinophils Absolute: 0.2 10*3/uL (ref 0.0–0.7)
Eosinophils Relative: 2.2 % (ref 0.0–5.0)
Hemoglobin: 15.3 g/dL (ref 13.0–17.0)
INR: 2.3 — ABNORMAL HIGH (ref 0.8–1.0)
Lymphocytes Relative: 36.9 % (ref 12.0–46.0)
MCHC: 35.3 g/dL (ref 30.0–36.0)
MCV: 85.9 fL (ref 78.0–100.0)
Monocytes Relative: 12.5 % — ABNORMAL HIGH (ref 3.0–12.0)
Neutro Abs: 3.4 10*3/uL (ref 1.4–7.7)
Neutrophils Relative %: 48.1 % (ref 43.0–77.0)
Prothrombin Time: 25.1 s — ABNORMAL HIGH (ref 10.9–13.3)
RBC: 5.05 M/uL (ref 4.22–5.81)
WBC: 7.1 10*3/uL (ref 4.5–10.5)

## 2008-04-28 ENCOUNTER — Ambulatory Visit: Payer: Self-pay | Admitting: Pulmonary Disease

## 2008-04-28 DIAGNOSIS — R0602 Shortness of breath: Secondary | ICD-10-CM

## 2008-04-28 LAB — CONVERTED CEMR LAB
CO2: 27 meq/L (ref 19–32)
Chloride: 111 meq/L (ref 96–112)
Creatinine, Ser: 1.2 mg/dL (ref 0.4–1.5)
Glucose, Bld: 130 mg/dL — ABNORMAL HIGH (ref 70–99)
Sodium: 142 meq/L (ref 135–145)

## 2008-04-30 ENCOUNTER — Ambulatory Visit: Payer: Self-pay | Admitting: Cardiovascular Disease

## 2008-05-16 ENCOUNTER — Ambulatory Visit: Payer: Self-pay | Admitting: Pulmonary Disease

## 2008-05-21 ENCOUNTER — Ambulatory Visit: Payer: Self-pay | Admitting: Cardiology

## 2008-06-05 ENCOUNTER — Ambulatory Visit: Payer: Self-pay | Admitting: Cardiology

## 2008-06-09 ENCOUNTER — Ambulatory Visit: Payer: Self-pay | Admitting: Pulmonary Disease

## 2008-06-09 DIAGNOSIS — J439 Emphysema, unspecified: Secondary | ICD-10-CM | POA: Insufficient documentation

## 2008-06-19 ENCOUNTER — Encounter: Payer: Self-pay | Admitting: Internal Medicine

## 2008-07-01 ENCOUNTER — Encounter: Payer: Self-pay | Admitting: Internal Medicine

## 2008-07-02 ENCOUNTER — Telehealth: Payer: Self-pay | Admitting: Pulmonary Disease

## 2008-07-03 ENCOUNTER — Ambulatory Visit: Payer: Self-pay | Admitting: Cardiology

## 2008-07-31 ENCOUNTER — Ambulatory Visit: Payer: Self-pay | Admitting: Cardiology

## 2008-08-11 ENCOUNTER — Ambulatory Visit: Payer: Self-pay | Admitting: Pulmonary Disease

## 2008-08-28 ENCOUNTER — Ambulatory Visit: Payer: Self-pay | Admitting: Cardiology

## 2008-09-25 ENCOUNTER — Ambulatory Visit: Payer: Self-pay | Admitting: Cardiology

## 2008-10-23 ENCOUNTER — Ambulatory Visit: Payer: Self-pay | Admitting: Internal Medicine

## 2008-11-03 ENCOUNTER — Ambulatory Visit: Payer: Self-pay | Admitting: Cardiology

## 2008-11-03 LAB — CONVERTED CEMR LAB
ALT: 14 units/L (ref 0–53)
AST: 16 units/L (ref 0–37)
Albumin: 3.7 g/dL (ref 3.5–5.2)
Alkaline Phosphatase: 56 units/L (ref 39–117)
Basophils Absolute: 0.1 10*3/uL (ref 0.0–0.1)
Basophils Relative: 1 % (ref 0.0–3.0)
Bilirubin, Direct: 0.2 mg/dL (ref 0.0–0.3)
Cholesterol: 119 mg/dL (ref 0–200)
Eosinophils Absolute: 0.1 10*3/uL (ref 0.0–0.7)
Eosinophils Relative: 1.8 % (ref 0.0–5.0)
HCT: 45.5 % (ref 39.0–52.0)
HDL: 26.8 mg/dL — ABNORMAL LOW (ref >39.0)
Hemoglobin: 15.7 g/dL (ref 13.0–17.0)
LDL Cholesterol: 65 mg/dL (ref 0–99)
Lymphocytes Relative: 33.9 % (ref 12.0–46.0)
MCHC: 34.5 g/dL (ref 30.0–36.0)
MCV: 90.1 fL (ref 78.0–100.0)
Monocytes Absolute: 0.8 10*3/uL (ref 0.1–1.0)
Monocytes Relative: 11.3 % (ref 3.0–12.0)
Neutro Abs: 3.6 10*3/uL (ref 1.4–7.7)
Neutrophils Relative %: 52 % (ref 43.0–77.0)
Platelets: 132 10*3/uL — ABNORMAL LOW (ref 150–400)
RBC: 5.04 M/uL (ref 4.22–5.81)
RDW: 12.6 % (ref 11.5–14.6)
Total Bilirubin: 1 mg/dL (ref 0.3–1.2)
Total CHOL/HDL Ratio: 4.4
Total Protein: 7.1 g/dL (ref 6.0–8.3)
Triglycerides: 138 mg/dL (ref 0–149)
VLDL: 28 mg/dL (ref 0–40)
WBC: 6.9 10*3/uL (ref 4.5–10.5)

## 2008-11-18 ENCOUNTER — Encounter: Payer: Self-pay | Admitting: Pulmonary Disease

## 2008-11-20 ENCOUNTER — Ambulatory Visit: Payer: Self-pay | Admitting: Pulmonary Disease

## 2008-11-20 ENCOUNTER — Ambulatory Visit: Payer: Self-pay | Admitting: Cardiology

## 2008-11-20 DIAGNOSIS — T887XXA Unspecified adverse effect of drug or medicament, initial encounter: Secondary | ICD-10-CM | POA: Insufficient documentation

## 2008-11-20 LAB — CONVERTED CEMR LAB
BUN: 12 mg/dL (ref 6–23)
CO2: 30 meq/L (ref 19–32)
Calcium: 9.1 mg/dL (ref 8.4–10.5)
Creatinine, Ser: 1.4 mg/dL (ref 0.4–1.5)
Glucose, Bld: 137 mg/dL — ABNORMAL HIGH (ref 70–99)

## 2008-11-21 ENCOUNTER — Ambulatory Visit: Payer: Self-pay | Admitting: Internal Medicine

## 2008-11-27 ENCOUNTER — Ambulatory Visit: Payer: Self-pay | Admitting: Cardiology

## 2008-12-03 ENCOUNTER — Ambulatory Visit: Payer: Self-pay | Admitting: Internal Medicine

## 2008-12-03 DIAGNOSIS — Z8679 Personal history of other diseases of the circulatory system: Secondary | ICD-10-CM | POA: Insufficient documentation

## 2008-12-03 DIAGNOSIS — E041 Nontoxic single thyroid nodule: Secondary | ICD-10-CM

## 2008-12-05 ENCOUNTER — Encounter: Admission: RE | Admit: 2008-12-05 | Discharge: 2008-12-05 | Payer: Self-pay | Admitting: Internal Medicine

## 2008-12-06 ENCOUNTER — Encounter: Payer: Self-pay | Admitting: Internal Medicine

## 2008-12-06 LAB — CONVERTED CEMR LAB: Free T4: 0.8 ng/dL (ref 0.6–1.6)

## 2008-12-08 ENCOUNTER — Encounter (INDEPENDENT_AMBULATORY_CARE_PROVIDER_SITE_OTHER): Payer: Self-pay | Admitting: *Deleted

## 2008-12-08 ENCOUNTER — Telehealth (INDEPENDENT_AMBULATORY_CARE_PROVIDER_SITE_OTHER): Payer: Self-pay | Admitting: *Deleted

## 2008-12-09 ENCOUNTER — Encounter (INDEPENDENT_AMBULATORY_CARE_PROVIDER_SITE_OTHER): Payer: Self-pay | Admitting: *Deleted

## 2008-12-11 ENCOUNTER — Ambulatory Visit: Payer: Self-pay | Admitting: Internal Medicine

## 2008-12-12 ENCOUNTER — Emergency Department (HOSPITAL_BASED_OUTPATIENT_CLINIC_OR_DEPARTMENT_OTHER): Admission: EM | Admit: 2008-12-12 | Discharge: 2008-12-12 | Payer: Self-pay | Admitting: Emergency Medicine

## 2008-12-31 ENCOUNTER — Encounter (HOSPITAL_COMMUNITY): Admission: RE | Admit: 2008-12-31 | Discharge: 2009-03-31 | Payer: Self-pay | Admitting: Internal Medicine

## 2009-01-01 ENCOUNTER — Ambulatory Visit: Payer: Self-pay | Admitting: Internal Medicine

## 2009-01-05 ENCOUNTER — Encounter (INDEPENDENT_AMBULATORY_CARE_PROVIDER_SITE_OTHER): Payer: Self-pay | Admitting: *Deleted

## 2009-01-05 ENCOUNTER — Telehealth (INDEPENDENT_AMBULATORY_CARE_PROVIDER_SITE_OTHER): Payer: Self-pay | Admitting: *Deleted

## 2009-01-29 ENCOUNTER — Ambulatory Visit: Payer: Self-pay | Admitting: Cardiology

## 2009-02-09 ENCOUNTER — Ambulatory Visit: Payer: Self-pay | Admitting: Pulmonary Disease

## 2009-02-17 ENCOUNTER — Ambulatory Visit: Payer: Self-pay | Admitting: Pulmonary Disease

## 2009-02-17 ENCOUNTER — Encounter: Payer: Self-pay | Admitting: *Deleted

## 2009-02-17 ENCOUNTER — Telehealth (INDEPENDENT_AMBULATORY_CARE_PROVIDER_SITE_OTHER): Payer: Self-pay | Admitting: *Deleted

## 2009-02-17 LAB — CONVERTED CEMR LAB
Basophils Relative: 0.2 % (ref 0.0–3.0)
Eosinophils Absolute: 0.1 10*3/uL (ref 0.0–0.7)
HCT: 42.5 % (ref 39.0–52.0)
Hemoglobin: 14.6 g/dL (ref 13.0–17.0)
Lymphocytes Relative: 27.4 % (ref 12.0–46.0)
MCHC: 34.3 g/dL (ref 30.0–36.0)
MCV: 92.8 fL (ref 78.0–100.0)
Neutro Abs: 4.3 10*3/uL (ref 1.4–7.7)
RBC: 4.58 M/uL (ref 4.22–5.81)

## 2009-02-20 ENCOUNTER — Ambulatory Visit: Payer: Self-pay | Admitting: Cardiology

## 2009-02-20 LAB — CONVERTED CEMR LAB: Protime: 14.2

## 2009-02-26 ENCOUNTER — Ambulatory Visit: Payer: Self-pay | Admitting: Cardiovascular Disease

## 2009-02-26 ENCOUNTER — Encounter (INDEPENDENT_AMBULATORY_CARE_PROVIDER_SITE_OTHER): Payer: Self-pay | Admitting: Cardiology

## 2009-02-26 LAB — CONVERTED CEMR LAB: POC INR: 1.7

## 2009-03-10 ENCOUNTER — Ambulatory Visit: Payer: Self-pay | Admitting: Pulmonary Disease

## 2009-03-10 ENCOUNTER — Ambulatory Visit: Payer: Self-pay | Admitting: Cardiovascular Disease

## 2009-03-10 LAB — CONVERTED CEMR LAB: POC INR: 2.3

## 2009-03-25 ENCOUNTER — Encounter: Payer: Self-pay | Admitting: *Deleted

## 2009-03-26 ENCOUNTER — Ambulatory Visit: Payer: Self-pay | Admitting: Cardiology

## 2009-03-26 LAB — CONVERTED CEMR LAB: Prothrombin Time: 17.3 s

## 2009-04-23 ENCOUNTER — Ambulatory Visit: Payer: Self-pay | Admitting: Cardiology

## 2009-05-13 ENCOUNTER — Encounter (INDEPENDENT_AMBULATORY_CARE_PROVIDER_SITE_OTHER): Payer: Self-pay | Admitting: *Deleted

## 2009-05-21 ENCOUNTER — Ambulatory Visit: Payer: Self-pay | Admitting: Internal Medicine

## 2009-06-03 ENCOUNTER — Encounter: Payer: Self-pay | Admitting: Pulmonary Disease

## 2009-06-11 ENCOUNTER — Ambulatory Visit: Payer: Self-pay | Admitting: Internal Medicine

## 2009-06-18 ENCOUNTER — Ambulatory Visit: Payer: Self-pay | Admitting: Cardiology

## 2009-06-18 LAB — CONVERTED CEMR LAB: POC INR: 2.8

## 2009-07-14 ENCOUNTER — Encounter: Payer: Self-pay | Admitting: Internal Medicine

## 2009-07-15 ENCOUNTER — Ambulatory Visit: Payer: Self-pay | Admitting: Internal Medicine

## 2009-07-15 DIAGNOSIS — R21 Rash and other nonspecific skin eruption: Secondary | ICD-10-CM | POA: Insufficient documentation

## 2009-07-16 ENCOUNTER — Ambulatory Visit: Payer: Self-pay | Admitting: Cardiovascular Disease

## 2009-07-16 LAB — CONVERTED CEMR LAB: POC INR: 2.6

## 2009-08-03 ENCOUNTER — Ambulatory Visit: Payer: Self-pay | Admitting: Cardiology

## 2009-08-03 DIAGNOSIS — Z9889 Other specified postprocedural states: Secondary | ICD-10-CM

## 2009-08-03 DIAGNOSIS — I4892 Unspecified atrial flutter: Secondary | ICD-10-CM

## 2009-08-03 DIAGNOSIS — I2510 Atherosclerotic heart disease of native coronary artery without angina pectoris: Secondary | ICD-10-CM | POA: Insufficient documentation

## 2009-08-03 DIAGNOSIS — E7800 Pure hypercholesterolemia, unspecified: Secondary | ICD-10-CM | POA: Insufficient documentation

## 2009-08-12 ENCOUNTER — Ambulatory Visit: Payer: Self-pay | Admitting: Cardiology

## 2009-08-20 ENCOUNTER — Ambulatory Visit: Payer: Self-pay | Admitting: Cardiology

## 2009-08-20 ENCOUNTER — Ambulatory Visit: Payer: Self-pay

## 2009-08-20 ENCOUNTER — Encounter: Payer: Self-pay | Admitting: Cardiology

## 2009-08-20 ENCOUNTER — Ambulatory Visit (HOSPITAL_COMMUNITY): Admission: RE | Admit: 2009-08-20 | Discharge: 2009-08-20 | Payer: Self-pay | Admitting: Gynecology

## 2009-08-20 DIAGNOSIS — I059 Rheumatic mitral valve disease, unspecified: Secondary | ICD-10-CM | POA: Insufficient documentation

## 2009-08-20 DIAGNOSIS — I10 Essential (primary) hypertension: Secondary | ICD-10-CM

## 2009-08-30 LAB — CONVERTED CEMR LAB
Albumin: 3.7 g/dL (ref 3.5–5.2)
Basophils Relative: 0.6 % (ref 0.0–3.0)
Eosinophils Relative: 1.5 % (ref 0.0–5.0)
HDL: 29.6 mg/dL — ABNORMAL LOW (ref >39.00)
LDL Cholesterol: 59 mg/dL (ref 0–99)
Lymphocytes Relative: 33.4 % (ref 12.0–46.0)
Monocytes Relative: 11.4 % (ref 3.0–12.0)
Neutrophils Relative %: 53.1 % (ref 43.0–77.0)
RBC: 4.88 M/uL (ref 4.22–5.81)
Total CHOL/HDL Ratio: 4
Triglycerides: 96 mg/dL (ref 0.0–149.0)
WBC: 7.1 10*3/uL (ref 4.5–10.5)

## 2009-08-31 ENCOUNTER — Encounter (INDEPENDENT_AMBULATORY_CARE_PROVIDER_SITE_OTHER): Payer: Self-pay | Admitting: *Deleted

## 2009-09-07 ENCOUNTER — Ambulatory Visit: Payer: Self-pay | Admitting: Pulmonary Disease

## 2009-09-09 ENCOUNTER — Ambulatory Visit: Payer: Self-pay | Admitting: Cardiology

## 2009-09-09 LAB — CONVERTED CEMR LAB: POC INR: 1.8

## 2009-09-30 ENCOUNTER — Ambulatory Visit: Payer: Self-pay | Admitting: Internal Medicine

## 2009-09-30 LAB — CONVERTED CEMR LAB: POC INR: 2

## 2009-10-20 ENCOUNTER — Ambulatory Visit: Payer: Self-pay | Admitting: Internal Medicine

## 2009-10-20 LAB — CONVERTED CEMR LAB: POC INR: 2.2

## 2009-11-17 ENCOUNTER — Ambulatory Visit: Payer: Self-pay | Admitting: Internal Medicine

## 2009-11-17 ENCOUNTER — Encounter (INDEPENDENT_AMBULATORY_CARE_PROVIDER_SITE_OTHER): Payer: Self-pay | Admitting: Cardiology

## 2009-12-10 ENCOUNTER — Encounter: Payer: Self-pay | Admitting: Pulmonary Disease

## 2009-12-14 ENCOUNTER — Ambulatory Visit: Payer: Self-pay | Admitting: Cardiovascular Disease

## 2009-12-14 LAB — CONVERTED CEMR LAB: POC INR: 2.3

## 2009-12-18 ENCOUNTER — Telehealth: Payer: Self-pay | Admitting: Internal Medicine

## 2009-12-21 ENCOUNTER — Telehealth: Payer: Self-pay | Admitting: Cardiology

## 2009-12-21 ENCOUNTER — Ambulatory Visit: Payer: Self-pay | Admitting: Internal Medicine

## 2009-12-21 LAB — CONVERTED CEMR LAB: TSH: 3.03 microintl units/mL (ref 0.35–5.50)

## 2009-12-23 ENCOUNTER — Ambulatory Visit: Payer: Self-pay | Admitting: Internal Medicine

## 2010-01-11 ENCOUNTER — Telehealth: Payer: Self-pay | Admitting: Cardiology

## 2010-01-12 ENCOUNTER — Ambulatory Visit: Payer: Self-pay | Admitting: Cardiology

## 2010-01-12 LAB — CONVERTED CEMR LAB: POC INR: 2.3

## 2010-01-14 ENCOUNTER — Telehealth: Payer: Self-pay | Admitting: Cardiology

## 2010-01-14 ENCOUNTER — Encounter: Payer: Self-pay | Admitting: Internal Medicine

## 2010-02-03 ENCOUNTER — Encounter: Payer: Self-pay | Admitting: Internal Medicine

## 2010-02-03 ENCOUNTER — Ambulatory Visit: Payer: Self-pay | Admitting: Cardiology

## 2010-02-03 ENCOUNTER — Encounter: Admission: RE | Admit: 2010-02-03 | Discharge: 2010-02-03 | Payer: Self-pay | Admitting: Internal Medicine

## 2010-02-03 ENCOUNTER — Other Ambulatory Visit: Admission: RE | Admit: 2010-02-03 | Discharge: 2010-02-03 | Payer: Self-pay | Admitting: Interventional Radiology

## 2010-02-09 ENCOUNTER — Ambulatory Visit: Payer: Self-pay | Admitting: Cardiology

## 2010-02-09 LAB — CONVERTED CEMR LAB: POC INR: 1.4

## 2010-02-10 ENCOUNTER — Ambulatory Visit: Payer: Self-pay | Admitting: Internal Medicine

## 2010-02-12 ENCOUNTER — Ambulatory Visit: Payer: Self-pay | Admitting: Cardiology

## 2010-02-12 LAB — CONVERTED CEMR LAB: POC INR: 1.7

## 2010-02-18 ENCOUNTER — Telehealth: Payer: Self-pay | Admitting: Internal Medicine

## 2010-02-19 ENCOUNTER — Ambulatory Visit: Payer: Self-pay | Admitting: Cardiovascular Disease

## 2010-02-19 LAB — CONVERTED CEMR LAB: POC INR: 1.4

## 2010-02-25 ENCOUNTER — Encounter (INDEPENDENT_AMBULATORY_CARE_PROVIDER_SITE_OTHER): Payer: Self-pay | Admitting: *Deleted

## 2010-03-02 ENCOUNTER — Ambulatory Visit: Payer: Self-pay | Admitting: Internal Medicine

## 2010-03-02 LAB — CONVERTED CEMR LAB: POC INR: 2.2

## 2010-03-05 ENCOUNTER — Encounter (INDEPENDENT_AMBULATORY_CARE_PROVIDER_SITE_OTHER): Payer: Self-pay | Admitting: *Deleted

## 2010-03-08 ENCOUNTER — Ambulatory Visit: Payer: Self-pay | Admitting: Pulmonary Disease

## 2010-03-08 ENCOUNTER — Telehealth (INDEPENDENT_AMBULATORY_CARE_PROVIDER_SITE_OTHER): Payer: Self-pay | Admitting: *Deleted

## 2010-03-08 ENCOUNTER — Ambulatory Visit: Payer: Self-pay | Admitting: Internal Medicine

## 2010-03-19 ENCOUNTER — Ambulatory Visit: Payer: Self-pay | Admitting: Internal Medicine

## 2010-03-23 ENCOUNTER — Ambulatory Visit: Payer: Self-pay | Admitting: Internal Medicine

## 2010-03-24 ENCOUNTER — Ambulatory Visit: Payer: Self-pay | Admitting: Internal Medicine

## 2010-03-24 DIAGNOSIS — D696 Thrombocytopenia, unspecified: Secondary | ICD-10-CM

## 2010-03-24 LAB — CONVERTED CEMR LAB
Basophils Relative: 0.8 % (ref 0.0–3.0)
Eosinophils Absolute: 0.2 10*3/uL (ref 0.0–0.7)
Eosinophils Relative: 2.7 % (ref 0.0–5.0)
Lymphocytes Relative: 36.2 % (ref 12.0–46.0)
MCHC: 34.5 g/dL (ref 30.0–36.0)
MCV: 91.5 fL (ref 78.0–100.0)
Monocytes Absolute: 0.7 10*3/uL (ref 0.1–1.0)
Neutrophils Relative %: 50.7 % (ref 43.0–77.0)
Platelets: 128 10*3/uL — ABNORMAL LOW (ref 150.0–400.0)
RBC: 4.76 M/uL (ref 4.22–5.81)
WBC: 7.3 10*3/uL (ref 4.5–10.5)

## 2010-04-06 ENCOUNTER — Emergency Department (HOSPITAL_COMMUNITY): Admission: EM | Admit: 2010-04-06 | Discharge: 2010-04-06 | Payer: Self-pay | Admitting: Emergency Medicine

## 2010-04-20 ENCOUNTER — Ambulatory Visit: Payer: Self-pay | Admitting: Cardiology

## 2010-04-20 LAB — CONVERTED CEMR LAB: POC INR: 3.6

## 2010-05-11 ENCOUNTER — Ambulatory Visit: Payer: Self-pay | Admitting: Cardiovascular Disease

## 2010-05-11 LAB — CONVERTED CEMR LAB: POC INR: 3

## 2010-06-08 ENCOUNTER — Ambulatory Visit: Payer: Self-pay | Admitting: Cardiology

## 2010-06-08 LAB — CONVERTED CEMR LAB: POC INR: 1.9

## 2010-07-06 ENCOUNTER — Ambulatory Visit: Payer: Self-pay | Admitting: Internal Medicine

## 2010-07-12 ENCOUNTER — Telehealth: Payer: Self-pay | Admitting: Cardiology

## 2010-07-13 ENCOUNTER — Encounter: Payer: Self-pay | Admitting: Internal Medicine

## 2010-07-14 ENCOUNTER — Ambulatory Visit: Payer: Self-pay | Admitting: Cardiovascular Disease

## 2010-07-16 ENCOUNTER — Ambulatory Visit: Payer: Self-pay | Admitting: Internal Medicine

## 2010-07-20 ENCOUNTER — Ambulatory Visit: Payer: Self-pay | Admitting: Cardiovascular Disease

## 2010-08-03 ENCOUNTER — Ambulatory Visit: Payer: Self-pay | Admitting: Cardiology

## 2010-08-03 ENCOUNTER — Ambulatory Visit: Payer: Self-pay | Admitting: Cardiovascular Disease

## 2010-08-31 ENCOUNTER — Ambulatory Visit: Payer: Self-pay | Admitting: Cardiology

## 2010-08-31 ENCOUNTER — Encounter: Payer: Self-pay | Admitting: Cardiology

## 2010-08-31 ENCOUNTER — Ambulatory Visit: Payer: Self-pay

## 2010-08-31 ENCOUNTER — Encounter (INDEPENDENT_AMBULATORY_CARE_PROVIDER_SITE_OTHER): Payer: Self-pay | Admitting: *Deleted

## 2010-08-31 ENCOUNTER — Ambulatory Visit (HOSPITAL_COMMUNITY)
Admission: RE | Admit: 2010-08-31 | Discharge: 2010-08-31 | Payer: Self-pay | Source: Home / Self Care | Attending: Cardiology | Admitting: Cardiology

## 2010-08-31 LAB — CONVERTED CEMR LAB
Bilirubin, Direct: 0.1 mg/dL (ref 0.0–0.3)
HDL: 31.1 mg/dL — ABNORMAL LOW (ref >39.00)
LDL Cholesterol: 112 mg/dL — ABNORMAL HIGH (ref 0–99)
Total Bilirubin: 0.7 mg/dL (ref 0.3–1.2)
Total CHOL/HDL Ratio: 5
Triglycerides: 107 mg/dL (ref 0.0–149.0)

## 2010-09-28 ENCOUNTER — Ambulatory Visit: Admission: RE | Admit: 2010-09-28 | Discharge: 2010-09-28 | Payer: Self-pay | Source: Home / Self Care

## 2010-10-19 NOTE — Medication Information (Signed)
Summary: rov/tm  Anticoagulant Therapy  Managed by: Cloyde Reams, RN, BSN Referring MD: Olga Millers MD PCP: Marga Melnick Supervising MD: Shirlee Latch MD, Makynleigh Breslin Indication 1: Atrial Fibrillation (ICD-427.31) Indication 2: stroke-embolic (ICD-436.0) Lab Used: Primary school teacher Site: [address] INR POC 2.3 INR RANGE 2 - 3  Dietary changes: no    Health status changes: no    Bleeding/hemorrhagic complications: no    Recent/future hospitalizations: no    Any changes in medication regimen? no    Recent/future dental: no  Any missed doses?: no       Is patient compliant with meds? yes       Allergies (verified): No Known Drug Allergies  Anticoagulation Management History:      The patient is taking warfarin and comes in today for a routine follow up visit.  Positive risk factors for bleeding include an age of 73 years or older.  The bleeding index is 'intermediate risk'.  Positive CHADS2 values include History of HTN.  Negative CHADS2 values include Age > 56 years old.  The start date was 09/29/2004.  His last INR was 3.6 ratio.  Anticoagulation responsible provider: Shirlee Latch MD, Harinder Romas.  INR POC: 2.3.  Cuvette Lot#: [id number].  Exp: 02/2011.    Anticoagulation Management Assessment/Plan:      The patient's current anticoagulation dose is Coumadin 5 mg tabs: Take as directed by Anticoagulation Clinic.  The target INR is 2.0-3.0.  The next INR is due 02/09/2010.  Anticoagulation instructions were given to patient.  Results were reviewed/authorized by Cloyde Reams, RN, BSN.  He was notified by Cloyde Reams RN.         Prior Anticoagulation Instructions: INR 2.3  Continue on same dosage 1 tablet daily except 1.5 tablets on Sundays, Tuesdays, and Thursdays.  Recheck in 4 weeks.    Current Anticoagulation Instructions: Same as Prior Instructions.

## 2010-10-19 NOTE — Medication Information (Signed)
Summary: rov/sp  Anticoagulant Therapy  Managed by: [name], PharmD Referring MD: Olga Millers MD PCP: Marga Melnick MD Supervising MD: Gala Romney MD, Reuel Boom Indication 1: Atrial Fibrillation (ICD-427.31) Indication 2: stroke-embolic (ICD-436.0) Lab Used: Primary school teacher Site: [address] INR POC 2.2 INR RANGE 2 - 3  Dietary changes: no    Health status changes: no    Bleeding/hemorrhagic complications: no    Recent/future hospitalizations: no    Any changes in medication regimen? no    Recent/future dental: no  Any missed doses?: no       Is patient compliant with meds? yes       Allergies: No Known Drug Allergies  Anticoagulation Management History:      The patient is taking warfarin and comes in today for a routine follow up visit.  Positive risk factors for bleeding include an age of 75 years or older.  The bleeding index is 'intermediate risk'.  Positive CHADS2 values include History of HTN.  Negative CHADS2 values include Age > 19 years old.  The start date was 09/29/2004.  His last INR was 3.6 ratio.  Anticoagulation responsible provider: Charlei Ramsaran MD, Reuel Boom.  INR POC: 2.2.  Cuvette Lot#: [id number].  Exp: 04/2011.    Anticoagulation Management Assessment/Plan:      The patient's current anticoagulation dose is Coumadin 5 mg tabs: Take as directed by Anticoagulation Clinic.  The target INR is 2.0-3.0.  The next INR is due 03/23/2010.  Anticoagulation instructions were given to patient.  Results were reviewed/authorized by [name], PharmD.  He was notified by [name] PharmD.         Prior Anticoagulation Instructions: INR 1.4  Take 1 1/2 tablets today then increase dose to 1 1/2 tablets every day except 1 tablet on Monday and Friday.    Current Anticoagulation Instructions: INR 2.2  Continue same dose of 1 1/2 tablets every day except 1 tablet on Monday and Friday

## 2010-10-19 NOTE — Progress Notes (Signed)
Summary: Question about pt Coumadin  Phone Note Call from Patient Call back at [facility] Phone [phone] Call back at Work Phone [phone]   Caller: Spouse/jennett Summary of Call: Wife have question about pt coumadin Initial call taken by: Judie Grieve,  July 12, 2010 10:41 AM  Follow-up for Phone Call        Pt pending steriod/cortisone injection on Friday 07/16/10 with Dr Ethelene Hal needs to stop coumadin today. Is he cleared?  Follow-up by: Bethena Midget, RN, BSN,  July 12, 2010 12:58 PM  Additional Follow-up for Phone Call Additional follow up Details #1::        Per Dr. Jens Som, pt will need Lovenox bridge.   [name] PharmD  July 12, 2010 1:30 PM   Spoke with pt and made him aware to hold coumadin and come in on Wednesday for lovenox bridging. Per Dr Jens Som.  Additional Follow-up by: Bethena Midget, RN, BSN,  July 12, 2010 2:19 PM

## 2010-10-19 NOTE — Progress Notes (Signed)
Summary: Lovenox Bridge for Procedure  Phone Note From Other Clinic   Caller: Tammy  from [facility] Summary of Call: Tammy called.  [name] is having a thyroid biopsy.  Per Dr. Jens Som, [name] will need to be bridged with Lovenox.  His biopsy is scheduled for Wednesday May 18th at 10:15.  Tammy requests an INR that morning and fax results to her at [phone].  Will call [name] to schedule bridging.  Initial call taken by: [name] PharmD,  January 14, 2010 5:43 PM  Follow-up for Phone Call        Spoke with [name]'s wife.   5/13- Last dose of Coumadin 5/14- No Coumadin or Lovenox 5/15-5/16- Take Lovenox 100mg  two times a day  5/17- Take last dose of Coumadin in AM 5/18- Check INR prior to biopsy and fax to [place] [facility]    Follow-up by: [name] PharmD,  January 15, 2010 11:24 AM    New/Updated Medications: LOVENOX 100 MG/ML SOLN (ENOXAPARIN SODIUM) Inject one syringe subcutaneouslyinto abdomen twice a day as directed by Coumadin Clinic Prescriptions: LOVENOX 100 MG/ML SOLN (ENOXAPARIN SODIUM) Inject one syringe subcutaneouslyinto abdomen twice a day as directed by Coumadin Clinic  #10 x 1   Entered by:   [name] PharmD   Authorized by:   [name], MD, [facility]   Signed by:   [name] PharmD on 01/15/2010   Method used:   Electronically to        [facility] (retail)       [address]       [place], [place]  [address]       Ph: [phone]       Fax: [phone]   RxID:   [id number]

## 2010-10-19 NOTE — Medication Information (Signed)
Summary: rov/sp  Anticoagulant Therapy  Managed by: Bethena Midget, RN, BSN Referring MD: Olga Millers MD PCP: Marga Melnick Supervising MD: Jens Som MD, Arlys John Indication 1: Atrial Fibrillation (ICD-427.31) Indication 2: stroke-embolic (ICD-436.0) Lab Used: Primary school teacher Site: [address] INR POC 1.2 INR RANGE 2 - 3  Dietary changes: no    Health status changes: no    Bleeding/hemorrhagic complications: no    Recent/future hospitalizations: no    Any changes in medication regimen? no    Recent/future dental: no  Any missed doses?: no       Is patient compliant with meds? yes      Comments: Took last dose on 01/29/10 pending Thyroid  Bx today. Took last dose of Lovenox yesterday morning.   Allergies: No Known Drug Allergies  Anticoagulation Management History:      The patient is taking warfarin and comes in today for a routine follow up visit.  Positive risk factors for bleeding include an age of 75 years or older.  The bleeding index is 'intermediate risk'.  Positive CHADS2 values include History of HTN.  Negative CHADS2 values include Age > 73 years old.  The start date was 09/29/2004.  His last INR was 3.6 ratio.  Anticoagulation responsible provider: Jens Som MD, Arlys John.  INR POC: 1.2.  Cuvette Lot#: [id number].  Exp: 04/2011.    Anticoagulation Management Assessment/Plan:      The patient's current anticoagulation dose is Coumadin 5 mg tabs: Take as directed by Anticoagulation Clinic.  The target INR is 2.0-3.0.  The next INR is due 02/09/2010.  Anticoagulation instructions were given to patient.  Results were reviewed/authorized by Bethena Midget, RN, BSN.  He was notified by Bethena Midget, RN, BSN.         Prior Anticoagulation Instructions: INR 2.3  Continue on same dosage 1 tablet daily except 1.5 tablets on Sundays, Tuesdays, and Thursdays.  Recheck in 4 weeks.    Current Anticoagulation Instructions: INR 1.2 Resume coumadin and Lovenox per MD  instructions today after Biopsy, today or tomorrow. Resume coumadin with an extra 2.5mg  dose that day. Continue Lovenox Twice aday.

## 2010-10-19 NOTE — Progress Notes (Signed)
Summary: Medication  Phone Note Call from Patient Call back at Home Phone [phone]   Caller: Patient Call For: Dr. Leone Payor Reason for Call: Talk to Nurse Summary of Call: The Lialda med is too expensive...Marland Kitchenneeds an alternative    Initial call taken by: Karna Christmas,  February 18, 2010 3:26 PM  Follow-up for Phone Call        message left for pt to return call.  Dr. Leone Payor, is there a cheaper alternative for pt? Francee Piccolo CMA Duncan Dull)  February 19, 2010 11:11 AM   Additional Follow-up for Phone Call Additional follow up Details #1::        I think a flex sig on warfarin would make sense, to see if colitis still there and if he needs continued medication ask him about this I can call later in week if needed Additional Follow-up by: Iva Boop MD, Clementeen Graham,  February 22, 2010 7:14 AM    Additional Follow-up for Phone Call Additional follow up Details #2::    I spoke to pt's wife and notified her of the above.  She is agreeable to the flex sig and this is scheduled.  Advised pt's wife to call me for additional samples of Lialda if he runs out prior to procedure.  She is agreeable. Follow-up by: Francee Piccolo CMA Duncan Dull),  February 22, 2010 4:52 PM

## 2010-10-19 NOTE — Miscellaneous (Signed)
Summary: Orders Update   Clinical Lists Changes  Orders: Added new Referral order of [facility] Referral (Coumadin clinic) - Signed 

## 2010-10-19 NOTE — Progress Notes (Signed)
Summary: Lialda Samples  Phone Note Call from Patient   Summary of Call: While here for previsit pt requested samples of Lialda.  Enough samples given to pt to last through Flex Sig on 6/29. Initial call taken by: Francee Piccolo CMA Duncan Dull),  March 08, 2010 11:08 AM    New/Updated Medications: LIALDA 1.2 GM TBEC (MESALAMINE) 2 by mouth each day for colitis

## 2010-10-19 NOTE — Medication Information (Signed)
Summary: rov/eac  Anticoagulant Therapy  Managed by: [name], PharmD Referring MD: Olga Millers MD PCP: Marga Melnick MD Supervising MD: Excell Seltzer MD, Casimiro Needle Indication 1: Atrial Fibrillation (ICD-427.31) Indication 2: stroke-embolic (ICD-436.0) Lab Used: Primary school teacher Site: [address] INR POC 1.4 INR RANGE 2 - 3  Dietary changes: no    Health status changes: no    Bleeding/hemorrhagic complications: no    Recent/future hospitalizations: no    Any changes in medication regimen? no    Recent/future dental: no  Any missed doses?: no       Is patient compliant with meds? yes       Allergies: No Known Drug Allergies  Anticoagulation Management History:      The patient is taking warfarin and comes in today for a routine follow up visit.  Positive risk factors for bleeding include an age of 75 years or older.  The bleeding index is 'intermediate risk'.  Positive CHADS2 values include History of HTN.  Negative CHADS2 values include Age > 57 years old.  The start date was 09/29/2004.  His last INR was 3.6 ratio.  Anticoagulation responsible provider: Excell Seltzer MD, Casimiro Needle.  INR POC: 1.4.  Cuvette Lot#: [id number].  Exp: 04/2011.    Anticoagulation Management Assessment/Plan:      The patient's current anticoagulation dose is Coumadin 5 mg tabs: Take as directed by Anticoagulation Clinic.  The target INR is 2.0-3.0.  The next INR is due 03/02/2010.  Anticoagulation instructions were given to patient.  Results were reviewed/authorized by [name], PharmD.  He was notified by [name] PharmD.         Prior Anticoagulation Instructions: INR 1.7  Take 1.5 tablets today and tomorrow.  Then return to normal dosing schedule of 1.5 tablets on Sunday, Tuesday, and Thursday and 1 tablet all other days.  Return to clinic in 7 days.    Current Anticoagulation Instructions: INR 1.4  Take 1 1/2 tablets today then increase dose to 1 1/2 tablets every day except 1 tablet on  Monday and Friday.

## 2010-10-19 NOTE — Medication Information (Signed)
Summary: rov/ewj  Anticoagulant Therapy  Managed by: Cloyde Reams, RN Referring MD: Olga Millers MD PCP: Marga Melnick Supervising MD: Graciela Husbands MD, Viviann Spare Indication 1: Atrial Fibrillation (ICD-427.31) Indication 2: stroke-embolic (ICD-436.0) Lab Used: Primary school teacher Site: [address] INR POC 2.0 INR RANGE 2 - 3  Dietary changes: no    Health status changes: no    Bleeding/hemorrhagic complications: no    Recent/future hospitalizations: no    Any changes in medication regimen? yes       Details: Started azithromycin 250mg  2 days ago (1/10). Will finish 1/14.  Recent/future dental: no  Any missed doses?: no       Is patient compliant with meds? yes       Allergies: No Known Drug Allergies  Anticoagulation Management History:      The patient is taking warfarin and comes in today for a routine follow up visit.  Positive risk factors for bleeding include an age of 75 years or older.  The bleeding index is 'intermediate risk'.  Positive CHADS2 values include History of HTN.  Negative CHADS2 values include Age > 20 years old.  The start date was 09/29/2004.  His last INR was 3.6 ratio.  Anticoagulation responsible provider: Graciela Husbands MD, Viviann Spare.  INR POC: 2.0.  Cuvette Lot#: [id number].  Exp: 12/2010.    Anticoagulation Management Assessment/Plan:      The patient's current anticoagulation dose is Coumadin 5 mg tabs: Take as directed by Anticoagulation Clinic.  The target INR is 2.0-3.0.  The next INR is due 10/20/2009.  Anticoagulation instructions were given to patient/spouse.  Results were reviewed/authorized by Cloyde Reams, RN.  He was notified by Lew Dawes, PharmD Candidate.         Prior Anticoagulation Instructions: INR 1.8  Take 1.5 tablets today, then start taking 1 tablet daily except 1.5 tablets on Sundays, Tuesdays, and Thursdays.  Recheck in 3 weeks.  Current Anticoagulation Instructions: INR 2.0  Take 1 tablet daily except 1.5 tablets on Tuesdays,  Thursdays, and Saturdays. Recheck 2/1.

## 2010-10-19 NOTE — Medication Information (Signed)
Summary: rov/sp  Anticoagulant Therapy  Managed by: Cloyde Reams, RN, BSN Referring MD: Olga Millers MD PCP: Marga Melnick MD Supervising MD: Gala Romney MD, Reuel Boom Indication 1: Atrial Fibrillation (ICD-427.31) Indication 2: stroke-embolic (ICD-436.0) Lab Used: Primary school teacher Site: [address] INR POC 2.7 INR RANGE 2 - 3  Dietary changes: no    Health status changes: no    Bleeding/hemorrhagic complications: no    Recent/future hospitalizations: no    Any changes in medication regimen? no    Recent/future dental: no  Any missed doses?: no       Is patient compliant with meds? yes       Allergies: No Known Drug Allergies  Anticoagulation Management History:      The patient is taking warfarin and comes in today for a routine follow up visit.  Positive risk factors for bleeding include an age of 75 years or older.  The bleeding index is 'intermediate risk'.  Positive CHADS2 values include History of HTN.  Negative CHADS2 values include Age > 73 years old.  The start date was 09/29/2004.  His last INR was 3.6 ratio.  Anticoagulation responsible provider: Karry Barrilleaux MD, Reuel Boom.  INR POC: 2.7.  Cuvette Lot#: [id number].  Exp: 05/2011.    Anticoagulation Management Assessment/Plan:      The patient's current anticoagulation dose is Coumadin 5 mg tabs: Take as directed by Anticoagulation Clinic.  The target INR is 2.0-3.0.  The next INR is due 04/20/2010.  Anticoagulation instructions were given to patient.  Results were reviewed/authorized by Cloyde Reams, RN, BSN.  He was notified by Cloyde Reams RN.         Prior Anticoagulation Instructions: INR 2.2  Continue same dose of 1 1/2 tablets every day except 1 tablet on Monday and Friday   Current Anticoagulation Instructions: INR 2.7  Continue on same dosage 1.5 tablets daily except 1 tablet on Mondays and Fridays.  Recheck in 4 weeks.

## 2010-10-19 NOTE — Miscellaneous (Signed)
Summary: Orders Update   Clinical Lists Changes  Orders: Added new Referral order of Radiology Referral (Radiology) - Signed 

## 2010-10-19 NOTE — Medication Information (Signed)
Summary: rov/he needs Lovenox bridge to allow fine needle aspiration o...  Anticoagulant Therapy  Managed by: Eda Keys, PharmD Referring MD: Olga Millers MD PCP: Marga Melnick Supervising MD: Jens Som MD, Arlys John Indication 1: Atrial Fibrillation (ICD-427.31) Indication 2: stroke-embolic (ICD-436.0) Lab Used: Primary school teacher Site: [address] INR POC 1.4 INR RANGE 2 - 3  Dietary changes: no    Health status changes: no    Bleeding/hemorrhagic complications: yes       Details: bruising around injection site  Recent/future hospitalizations: no    Any changes in medication regimen? no    Recent/future dental: no  Any missed doses?: no       Is patient compliant with meds? yes      Comments: Pt had procedure done this past week on 02/04/10, coumadin was held prior to procedure and lovenox bridging was used.  Patient remains on lovenox bridging.    Allergies: No Known Drug Allergies  Anticoagulation Management History:      The patient is taking warfarin and comes in today for a routine follow up visit.  Positive risk factors for bleeding include an age of 7 years or older.  The bleeding index is 'intermediate risk'.  Positive CHADS2 values include History of HTN.  Negative CHADS2 values include Age > 60 years old.  The start date was 09/29/2004.  His last INR was 3.6 ratio.  Anticoagulation responsible provider: Jens Som MD, Arlys John.  INR POC: 1.4.  Cuvette Lot#: [id number].  Exp: 04/2011.    Anticoagulation Management Assessment/Plan:      The patient's current anticoagulation dose is Coumadin 5 mg tabs: Take as directed by Anticoagulation Clinic.  The target INR is 2.0-3.0.  The next INR is due 02/12/2010.  Anticoagulation instructions were given to patient.  Results were reviewed/authorized by Eda Keys, PharmD.  He was notified by Eda Keys.         Prior Anticoagulation Instructions: INR 1.2 Resume coumadin and Lovenox per MD instructions today  after Biopsy, today or tomorrow. Resume coumadin with an extra 2.5mg  dose that day. Continue Lovenox Twice aday.   Current Anticoagulation Instructions: INR 1.4  Take 1.5 tablets today and tomorrow.  Then return to normal dosing schedue of Thursday of 1.5 tablets on Sunday, Tuesday, and Thursday and 1 tablet all other days.  Return to clinic on friday.

## 2010-10-19 NOTE — Letter (Signed)
Summary: [facility]  [facility]   Imported By: Lanelle Bal 07/22/2010 12:14:39  _____________________________________________________________________  External Attachment:    Type:   Image     Comment:   External Document

## 2010-10-19 NOTE — Letter (Signed)
Summary: Handout Printed  Printed Handout:  - Coumadin Instructions-w/out Meds 

## 2010-10-19 NOTE — Medication Information (Signed)
Summary: rov/ewj  Anticoagulant Therapy  Managed by: Cloyde Reams, RN, BSN Referring MD: Olga Millers MD PCP: Marga Melnick MD Supervising MD: Eden Emms MD, Theron Arista Indication 1: Atrial Fibrillation (ICD-427.31) Indication 2: stroke-embolic (ICD-436.0) Lab Used: Primary school teacher Site: [address] INR POC 3.0 INR RANGE 2 - 3  Dietary changes: no    Health status changes: no    Bleeding/hemorrhagic complications: no    Recent/future hospitalizations: no    Any changes in medication regimen? no    Recent/future dental: no  Any missed doses?: no       Is patient compliant with meds? yes       Allergies: No Known Drug Allergies  Anticoagulation Management History:      The patient is taking warfarin and comes in today for a routine follow up visit.  Positive risk factors for bleeding include an age of 36 years or older.  The bleeding index is 'intermediate risk'.  Positive CHADS2 values include History of HTN.  Negative CHADS2 values include Age > 81 years old.  The start date was 09/29/2004.  His last INR was 3.6 ratio.  Anticoagulation responsible [name]: Eden Emms MD, Theron Arista.  INR POC: 3.0.  Cuvette Lot#: [id number].  Exp: 06/2011.    Anticoagulation Management Assessment/Plan:      The patient's current anticoagulation dose is Coumadin 5 mg tabs: Take as directed by Anticoagulation Clinic.  The target INR is 2.0-3.0.  The next INR is due 06/08/2010.  Anticoagulation instructions were given to patient.  Results were reviewed/authorized by Cloyde Reams, RN, BSN.  He was notified by [name], PharmD Candidate.         Prior Anticoagulation Instructions: INR 3.6  Skip today's dosage of coumadin, Start taking 1.5 tablets daily except 1 tablet on Mondays, Wednesdays, and Fridays.  Recheck in 3 weeks.    Current Anticoagulation Instructions: INR 3.0  Today take 1/2 tablet, then continue 1.5 tablets daily except 1 tablet Mon, Wed and Fri.  Return to clinic in 4  weeks. Prescriptions: COUMADIN 5 MG TABS (WARFARIN SODIUM) Take as directed by Anticoagulation Clinic  #45 x 2   Entered by:   [name] PharmD   Authorized by:   [name], MD, [facility]   Signed by:   [name] PharmD on 05/11/2010   Method used:   Electronically to        [facility] (retail)       [address]       [place], [place]  [address]       Ph: [phone]       Fax: [phone]   RxID:   [id number]

## 2010-10-19 NOTE — Medication Information (Signed)
Summary: rov/ewj  Anticoagulant Therapy  Managed by: Cloyde Reams, RN, BSN Referring MD: Olga Millers MD PCP: Marga Melnick MD Supervising MD: Jens Som MD, Arlys John Indication 1: Atrial Fibrillation (ICD-427.31) Indication 2: stroke-embolic (ICD-436.0) Lab Used: Primary school teacher Site: [address] INR POC 3.6 INR RANGE 2 - 3  Dietary changes: no    Health status changes: no    Bleeding/hemorrhagic complications: yes       Details: Bruises easily  Recent/future hospitalizations: no    Any changes in medication regimen? yes       Details: Started on Sulfazine 500mg  bid and Endocet 5/325  Recent/future dental: no  Any missed doses?: no       Is patient compliant with meds? yes       Allergies: No Known Drug Allergies  Anticoagulation Management History:      The patient is taking warfarin and comes in today for a routine follow up visit.  Positive risk factors for bleeding include an age of 73 years or older.  The bleeding index is 'intermediate risk'.  Positive CHADS2 values include History of HTN.  Negative CHADS2 values include Age > 4 years old.  The start date was 09/29/2004.  His last INR was 3.6 ratio.  Anticoagulation responsible provider: Jens Som MD, Arlys John.  INR POC: 3.6.  Cuvette Lot#: [id number].  Exp: 06/2011.    Anticoagulation Management Assessment/Plan:      The patient's current anticoagulation dose is Coumadin 5 mg tabs: Take as directed by Anticoagulation Clinic.  The target INR is 2.0-3.0.  The next INR is due 05/11/2010.  Anticoagulation instructions were given to patient.  Results were reviewed/authorized by Cloyde Reams, RN, BSN.  He was notified by Cloyde Reams RN.         Prior Anticoagulation Instructions: INR 2.7  Continue on same dosage 1.5 tablets daily except 1 tablet on Mondays and Fridays.  Recheck in 4 weeks.    Current Anticoagulation Instructions: INR 3.6  Skip today's dosage of coumadin, Start taking 1.5 tablets daily except  1 tablet on Mondays, Wednesdays, and Fridays.  Recheck in 3 weeks.

## 2010-10-19 NOTE — Assessment & Plan Note (Signed)
Summary: rov for emphysema   Copy to:  [facility] Primary Provider/Referring Provider:  Marga Melnick MD  CC:  Pt is here for a 6 month f/u appt. Pt states breathing is unchanged from last visit- still sob with exertion.  Pt also c/o coughing up clear to white colored sputum.  Pt denied nasal congestion.  Pt states he is only using spiriva prn.  pt would like rx for Proair- states he only prev. inhaler.  .  History of Present Illness: The pt comes in today for f/u of his known emphysema.  He feels that he is doing well, and states that his breathing is at his usual baseline.  He is no longer taking spiriva because he feels it did not help him, and he has also felt the same way about advair and symbicort.  He is using proair away from home and nebs at home on an as needed basis only.  He denies any significant cough, congestion, or purulence.  Current Medications (verified): 1)  Diltiazem Hcl Cr 240 Mg  Cp24 (Diltiazem Hcl) .... Take 1 Tablet By Mouth Once A Day 2)  Lialda 1.2 Gm Tbec (Mesalamine) .... 2 By Mouth Each Day For Colitis 3)  Simvastatin 20 Mg  Tabs (Simvastatin) .... Take 1 Tablet By Mouth Once A Day 4)  Clonazepam 0.5 Mg  Tabs (Clonazepam) .... Take By Mouth As Needed 5)  Ferrous Sulfate 325 (65 Fe) Mg  Tabs (Ferrous Sulfate) .... Take 1 Tablet By Mouth Once A Day 6)  Proair Hfa 108 (90 Base) Mcg/act  Aers (Albuterol Sulfate) .... 2 Puffs Every 4-6 Hours As Needed 7)  Coumadin 5 Mg Tabs (Warfarin Sodium) .... Take As Directed By Anticoagulation Clinic 8)  Albuterol Sulfate (2.5 Mg/6ml) 0.083% Nebu (Albuterol Sulfate) .Marland Kitchen.. 1 Vial in Nebulizer Two Times A Day.  Can Take An Additional 2 Treatments If Needed. 9)  Spiriva Handihaler 18 Mcg  Caps (Tiotropium Bromide Monohydrate) .... One Puff  in Handihaler Daily As Needed 10)  Tramadol Hcl 50 Mg Tabs (Tramadol Hcl) .Marland Kitchen.. 1 Every 6 Hrs As Needed Pain  Allergies (verified): No Known Drug Allergies  Review of Systems       The patient  complains of shortness of breath with activity, productive cough, and change in color of mucus.  The patient denies shortness of breath at rest, non-productive cough, coughing up blood, chest pain, irregular heartbeats, acid heartburn, indigestion, loss of appetite, weight change, abdominal pain, difficulty swallowing, sore throat, tooth/dental problems, headaches, nasal congestion/difficulty breathing through nose, sneezing, itching, ear ache, anxiety, depression, hand/feet swelling, joint stiffness or pain, rash, and fever.    Vital Signs:  Patient profile:   75 year old male Height:      70 inches Weight:      199.56 pounds BMI:     28.74 O2 Sat:      93 % on Room air Temp:     97.4 degrees F oral Pulse rate:   70 / minute BP sitting:   122 / 68  (left arm) Cuff size:   regular  Vitals Entered By: Arman Filter LPN (March 08, 2010 10:13 AM)  O2 Flow:  Room air CC: Pt is here for a 6 month f/u appt. Pt states breathing is unchanged from last visit- still sob with exertion.  Pt also c/o coughing up clear to white colored sputum.  Pt denied nasal congestion.  Pt states he is only using spiriva prn.  pt would like rx for Proair-  states he only prev. inhaler.   Comments Medications reviewed with patient Arman Filter LPN  March 08, 2010 10:13 AM    Physical Exam  General:  26 male in nad Lungs:  mild decrease in bs, no wheezing or rhonchi Heart:  rrr, no mrg Extremities:  no signficant edema or cyanosis Neurologic:  alert and oriented, moves all 4.   Impression & Recommendations:  Problem # 1:  EMPHYSEMA (ICD-492.8) the pt feels he is at an acceptable baseline.  He is on no maintenance medications, and feels they did not offer him any improvement.  He is satisfied with as needed albuterol.  I have asked him to work on weight loss and some type of conditioning program, and to followup with me in a year or sooner if issues.    Other Orders: Est. Patient Level III  ([address]) Prescription Created Electronically [id number])  Patient Instructions: 1)  stop spiriva 2)  followup with me in one year.  Please call if having worsening breathing issues. 3)  continue on proair as needed for rescue.  You also have your neb treatments to fall back on if having a hard time.   Prescriptions: PROAIR HFA 108 (90 BASE) MCG/ACT  AERS (ALBUTEROL SULFATE) 2 puffs every 4-6 hours as needed  #1 x 12   Entered and Authorized by:   Barbaraann Share MD   Signed by:   Barbaraann Share MD on 03/08/2010   Method used:   Electronically to        [facility]  [address] [facility] (retail)       [address]       [place], [place]  [address]       Ph: [phone]       Fax: [phone]   RxID:   [id number]

## 2010-10-19 NOTE — Letter (Signed)
Summary: [facility]  [address] [place], [place] [address]   Phone: [phone]  Fax: [phone]       [name]    [birth year]    MRN: [id number]        Procedure Day /Date: Friday 03/19/10     Arrival Time:  7:30am       Procedure Time:  8:30am     Location of Procedure:                    _ X_  [facility] (4th Floor)   PREPARATION FOR FLEXIBLE SIGMOIDOSCOPY WITH MAGNESIUM CITRATE  Prior to the day before your procedure, purchase one 8 oz. bottle of Magnesium Citrate and one Fleet Enema from the laxative section of your drugstore.  _________________________________________________________________________________________________  THE DAY BEFORE YOUR PROCEDURE             DATE: 03/18/10   DAY: Thursday  1.   Have a clear liquid dinner the night before your procedure.  2.   Do not drink anything colored red or purple.  Avoid juices with pulp.  No orange juice.              CLEAR LIQUIDS INCLUDE: Water Jello Ice Popsicles Tea (sugar ok, no milk/cream) Powdered fruit flavored drinks Coffee (sugar ok, no milk/cream) Gatorade Juice: apple, white grape, white cranberry  Lemonade Clear bullion, consomm, broth Carbonated beverages (any kind) Strained chicken noodle soup Hard Candy   3.   At 7:00 pm the night before your procedure, drink one bottle of Magnesium Citrate over ice.  4.   Drink at least 3 more glasses of clear liquids before bedtime (preferably juices).  5.   Results are expected usually within 1 to 6 hours after taking the Magnesium Citrate.  ___________________________________________________________________________________________________     THE DAY OF YOUR PROCEDURE            DATE: 03/19/10 DAY: Friday  1.   Use Fleet Enema one hour prior to coming for procedure.  2.   You may drink clear liquids until 6:30PM (2 hours before exam)       MEDICATION INSTRUCTIONS  Unless otherwise  instructed, you should take regular prescription medications with a small sip of water as early as possible the morning of your procedure.    Additional medication instructions:  Stay on coumadin         OTHER INSTRUCTIONS  You will need a responsible adult at least 73 years of age to accompany you and drive you home.   This person must remain in the waiting room during your procedure.  Wear loose fitting clothing that is easily removed.  Leave jewelry and other valuables at home.  However, you may wish to bring a book to read or an iPod/MP3 player to listen to music as you wait for your procedure to start.  Remove all body piercing jewelry and leave at home.  Total time from sign-in until discharge is approximately 2-3 hours.  You should go home directly after your procedure and rest.  You can resume normal activities the day after your procedure.  The day of your procedure you should not:   Drive   Make legal decisions   Operate machinery   Drink alcohol   Return to work  You will receive specific instructions about eating, activities and medications before you leave.   The above instructions have been reviewed and explained to me by  Jennye Boroughs RN  March 08, 2010 11:39 AM    I fully understand and can verbalize these instructions _____________________________ Date _________

## 2010-10-19 NOTE — Assessment & Plan Note (Signed)
Summary: FOLLOW UP UC/SP   History of Present Illness Visit Type: Follow-up Visit Primary GI MD: Stan Head MD Primary Provider: Marga Melnick MD Chief Complaint: ulcerative colitis History of Present Illness:   Pt confused as to why he is here.  Does not know what ulcerative colitis is.  Says he is doing fine. Pt is only taking 2 Asacol daily instead of 9 tabs as prescrbed in the past. Here with wife who provides some history.   GI Review of Systems      Denies abdominal pain, acid reflux, belching, bloating, chest pain, dysphagia with liquids, dysphagia with solids, heartburn, loss of appetite, nausea, vomiting, vomiting blood, weight loss, and  weight gain.        Denies anal fissure, black tarry stools, change in bowel habit, constipation, diarrhea, diverticulosis, fecal incontinence, heme positive stool, hemorrhoids, irritable bowel syndrome, jaundice, light color stool, liver problems, rectal bleeding, and  rectal pain. Clinical Reports Reviewed:  Colonoscopy:  12/19/2007:  Acute Segmental Colitis 20-50 cm Diverticulosis sigmoid Otherwise normal  Biopsies of inflamed mucosa suggested ulcerative colitis as some was chronic  Normal colon biopsies were normal  Dr. Juanda Chance [facility]  12/19/2007:  Results: Diverticulosis.       Results: Colitis.       Location:  [facility].   Assessment Diagnoses: 558.9: Colitis, Unspecified.  562.10: Diverticulosis.    ***MICROSCOPIC EXAMINATION AND DIAGNOSIS***    1. COLON, RIGHT AND CECUM, BIOPSIES:   - NO ACTIVE MUCOSAL INFLAMMATION, GRANULOMAS OR DYSPLASIA   IDENTIFIED.   - MELANOSIS COLI    2. COLON, DESCENDING, 40-50 CM, BIOPSIES:   - FOCAL ACTIVE MUCOSAL COLITIS.   - NO DYSPLASIA IDENTIFIED.    3. COLON, SIGMOID, BIOPSIES:   - ACTIVE CHRONIC MUCOSAL COLITIS CONSISTENT WITH ULCERATIVE   COLITIS.   - NO DYSPLASIA IDENTIFIED.  Flexible Sigmoidoscopy:  01/11/2008:  Results: Hemorrhoids. Results:  Diverticulosis. Results: Ulcerative Colitis  01/11/2008:  Results: Hemorrhoids. Results: Diverticulosis. Results: Ulcerative Colitis     Current Medications (verified): 1)  Diltiazem Hcl Cr 240 Mg  Cp24 (Diltiazem Hcl) .... Take 1 Tablet By Mouth Once A Day 2)  Asacol 400 Mg  Tbec (Mesalamine) .... Take 1 Tablet By Mouth Two Times A Day 3)  Simvastatin 20 Mg  Tabs (Simvastatin) .... Take 1 Tablet By Mouth Once A Day 4)  Clonazepam 0.5 Mg  Tabs (Clonazepam) .... Take By Mouth As Needed 5)  Ferrous Sulfate 325 (65 Fe) Mg  Tabs (Ferrous Sulfate) .... Take 1 Tablet By Mouth Once A Day 6)  Proair Hfa 108 (90 Base) Mcg/act  Aers (Albuterol Sulfate) .... 2 Puffs Every 4-6 Hours As Needed 7)  Coumadin 5 Mg Tabs (Warfarin Sodium) .... Take As Directed By Anticoagulation Clinic 8)  Albuterol Sulfate (2.5 Mg/34ml) 0.083% Nebu (Albuterol Sulfate) .Marland Kitchen.. 1 Vial in Nebulizer Two Times A Day.  Can Take An Additional 2 Treatments If Needed. 9)  Spiriva Handihaler 18 Mcg  Caps (Tiotropium Bromide Monohydrate) .... One Puff  in Handihaler Daily As Needed 10)  Tramadol Hcl 50 Mg Tabs (Tramadol Hcl) .Marland Kitchen.. 1 Every 6 Hrs As Needed Pain 11)  Lovenox 100 Mg/ml Soln (Enoxaparin Sodium) .... Inject One Syringe Subcutaneouslyinto Abdomen Twice A Day As Directed By Coumadin Clinic  Allergies (verified): No Known Drug Allergies  Past History:  Past Medical History: MEDIASTINAL LYMPHADENOPATHY (ICD-785.6) HEMOPTYSIS (ICD-786.3) Prostate cancer, hx of Atrial Fibrillation COPD CAD (40 to 50 RCA by cath. 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 heart disease: father (stroke) cancer: mother (spinal cancer)   Social History: Reviewed history from 07/20/2008 and no changes required. Patient states former smoker.  pt is married. pt has children. pt is a retired Visual merchandiser. Alcohol Use - no  Vital Signs:  Patient profile:   75 year old male Height:      70 inches Weight:      198 pounds BMI:     28.51 Pulse rate:   76 / minute Pulse rhythm:   regular BP sitting:   116 / 66  (left arm) Cuff size:   regular  Vitals Entered By: Chales Abrahams CMA Duncan Dull) (Feb 10, 2010 2:57 PM)  Physical Exam  General:  obese.  NAD Eyes:  anicteric Lungs:  clear anterior Heart:  regular rhythm, no murmur, and bradycardia.  Split S1 Abdomen:  obese, soft and nontender large ventral hernia BS+ injection trauma (Lovenox bridge)   Impression & Recommendations:  Problem # 1:  ULCERATIVE COLITIS, LEFT SIDED (ICD-556.5) Assessment Unchanged Diagnosed at colonoscopy and confirmed by sigmidoscopy 12/2007. In setting of diverticulosis and limited to that segment of colon. He is asymptomatic on low-dose Asacol. Not seen since 2009. I re-explained the disease. I had questioned if it was ischemia at first but two sets of bxs showed chronic ulcerative colitis. It would be best to stay on ASA Tx to prevent relapse. The asacol is expensive so will see if Liald (appeasr to be covered) will be more cost-effective. Discussed sulfasalazine but given tid dosing, possible side effects,  and lab monitoring will not do so right now. 15 mins time spent  Patient Instructions: 1)  Please pick up your medications at your pharmacy. LIALDA 2)  Please let us know if this medication is too expensive and we will look for a cheaper alternative. 3)  Please schedule a follow-up appointment in 1 year. 4)  The medication list was reviewed and  reconciled.  All changed / newly prescribed medications were explained.  A complete medication list was provided to the patient / caregiver. Prescriptions: LIALDA 1.2 GM TBEC (MESALAMINE) 2 by mouth each day for colitis Brand medically necessary #180 x 3   Entered and Authorized by:   Iva Boop MD, [facility]   Signed by:   Iva Boop MD, FACG on 02/10/2010   Method used:   Electronically to        [facility]  [address] [address] [facility]retail)       [address]       [place], [place]  [address]       Ph: [phone]       Fax: [phone]   RxID:   [id number]

## 2010-10-19 NOTE — Miscellaneous (Signed)
Summary: Orders Update   Clinical Lists Changes  Orders: Added new Referral order of Radiology Referral (Radiology) - Signed  Appended Document: Orders Update     Clinical Lists Changes  Problems: Added new problem of ADVERSE DRUG REACTION (ICD-995.20) Orders: Added new Test order of TLB-BMP (Basic Metabolic Panel-BMET) (80048-METABOL) - Signed

## 2010-10-19 NOTE — Miscellaneous (Signed)
----   Converted from flag ---- ---- 01/12/2010 8:36 AM, [name], MD, [facility] wrote: He needs lovenox bridge with h/o CVA; just let coumadin clinic know and they will help you with this. BC  ---- 01/12/2010 7:41 AM, Marga Melnick MD wrote: Arlys John, would he need to come off coumadin with bridging  Lovenox coverage for the thyroid biopsy. If so , could this be arranged through the Coumadin Clinic. Thanks for your help. Hopp ------------------------------

## 2010-10-19 NOTE — Medication Information (Signed)
Summary: rov/ewj  Anticoagulant Therapy  Managed by: Cloyde Reams, RN, BSN Referring MD: Olga Millers MD PCP: Marga Melnick MD Supervising MD: Shirlee Latch MD, [place] Indication 1: Atrial Fibrillation (ICD-427.31) Indication 2: stroke-embolic (ICD-436.0) Lab Used: Primary school teacher Site: [address] INR POC 1.9 INR RANGE 2 - 3  Dietary changes: yes       Details: Incr intake of turnip greens lately.  Health status changes: no    Bleeding/hemorrhagic complications: no    Recent/future hospitalizations: no    Any changes in medication regimen? no    Recent/future dental: no  Any missed doses?: no       Is patient compliant with meds? yes       Allergies: No Known Drug Allergies  Anticoagulation Management History:      The patient is taking warfarin and comes in today for a routine follow up visit.  Positive risk factors for bleeding include an age of 56 years or older.  The bleeding index is 'intermediate risk'.  Positive CHADS2 values include History of HTN.  Negative CHADS2 values include Age > 39 years old.  The start date was 09/29/2004.  His last INR was 3.6 ratio.  Anticoagulation responsible provider: Shirlee Latch MD, Dalton.  INR POC: 1.9.  Cuvette Lot#: [id number].  Exp: 06/2011.    Anticoagulation Management Assessment/Plan:      The patient's current anticoagulation dose is Coumadin 5 mg tabs: Take as directed by Anticoagulation Clinic.  The target INR is 2.0-3.0.  The next INR is due 07/06/2010.  Anticoagulation instructions were given to patient.  Results were reviewed/authorized by Cloyde Reams, RN, BSN.  He was notified by Cloyde Reams RN.         Prior Anticoagulation Instructions: INR 3.0  Today take 1/2 tablet, then continue 1.5 tablets daily except 1 tablet Mon, Wed and Fri.  Return to clinic in 4 weeks.  Current Anticoagulation Instructions: INR 1.9  Take 2 tablets today, then resume same dosage 1.5 tablets daily except 1 tablet on Mondays,  Wednesdays, and Fridays. Recheck in 4 weeks.

## 2010-10-19 NOTE — Medication Information (Signed)
Summary: rov/ewj  Anticoagulant Therapy  Managed by: Reina Fuse, PharmD Referring MD: Olga Millers MD PCP: Marga Melnick MD Supervising MD: Eden Emms MD, Theron Arista Indication 1: Atrial Fibrillation (ICD-427.31) Indication 2: stroke-embolic (ICD-436.0) Lab Used: Primary school teacher Site: [address] INR POC 1.8 INR RANGE 2 - 3  Dietary changes: no    Health status changes: no    Bleeding/hemorrhagic complications: no    Recent/future hospitalizations: no    Any changes in medication regimen? no    Recent/future dental: no  Any missed doses?: no       Is patient compliant with meds? yes      Comments: Spinal epidural injection scheduled for Friday with [facility]. Last dose of Coumadin "Sunday, 10/24. Pt to initiate Lovenox bridge today.  Allergies: No Known Drug Allergies  Anticoagulation Management History:      The patient is taking warfarin and comes in today for a routine follow up visit.  Positive risk factors for bleeding include an age of 65 years or older.  The bleeding index is 'intermediate risk'.  Positive CHADS2 values include History of HTN.  Negative CHADS2 values include Age > 75 years old.  The start date was 09/29/2004.  His last INR was 3.6 ratio.  Anticoagulation responsible provider: Nishan MD, Peter.  INR POC: 1.8.  Cuvette Lot#: [id number].  Exp: 06/2011.    Anticoagulation Management Assessment/Plan:      The patient's current anticoagulation dose is Coumadin 5 mg tabs: Take as directed by Anticoagulation Clinic.  The target INR is 2.0-3.0.  The next INR is due 07/20/2010.  Anticoagulation instructions were given to patient.  Results were reviewed/authorized by Sarah Land, PharmD.  He was notified by Sarah Land PharmD.         Prior Anticoagulation Instructions: INR 2.4  Continue same dose of 1 1/2 tablets every day except 1 tablet on Monday, Wednesday and Friday. Recheck INR in 4 weeks.   Current Anticoagulation Instructions: INR  1.8  Start Lovenox today.  Inject Lovenox twice daily (12 hours apart) on Thursday. Do not take Lovenox on Friday morning. Start back Lovenox and Coumadin on Friday evening. Take Lovenox twice daily (12 hours apart) until return to clinic on Tuesday. Take Coumadin 1.5 tabs (7.5 mg) on Friday evening. Take Coumadin 2 tabs (10 mg) on Saturday. Take Coumadin 2 tabs on Sunday. Then, continue resume regular Coumadin schedule. Return to clinic Tuesday, November 1st.    Prescriptions: LOVENOX 100 MG/ML SOLN (ENOXAPARIN SODIUM) Inject 1 syringe subcutaneously every twelve hours.  #14 x 0   Entered by:   Sarah Land PharmD   Authorized by:   Peter Charles Nishan, MD, FACC   Signed by:   Sarah Land PharmD on 07/14/2010   Method used:   Electronically to        [facility]  [address] [facility]       [address]       [place], [place]  [address]       Ph: [phone]       Fax: [phone]   RxID:   [id number]

## 2010-10-19 NOTE — Medication Information (Signed)
Summary: rov/sp  Anticoagulant Therapy  Managed by: [name], PharmD Referring MD: Olga Millers MD PCP: Marga Melnick MD Supervising MD: Eden Emms MD, Theron Arista Indication 1: Atrial Fibrillation (ICD-427.31) Indication 2: stroke-embolic (ICD-436.0) Lab Used: Primary school teacher Site: [address] INR POC 2.0 INR RANGE 2 - 3  Dietary changes: no    Health status changes: no    Bleeding/hemorrhagic complications: yes       Details: bruising from Lovenox injections  Recent/future hospitalizations: no    Any changes in medication regimen? no    Recent/future dental: no  Any missed doses?: no       Is patient compliant with meds? yes       Allergies: No Known Drug Allergies  Anticoagulation Management History:      The patient is taking warfarin and comes in today for a routine follow up visit.  Positive risk factors for bleeding include an age of 73 years or older.  The bleeding index is 'intermediate risk'.  Positive CHADS2 values include History of HTN.  Negative CHADS2 values include Age > 85 years old.  The start date was 09/29/2004.  His last INR was 3.6 ratio.  Anticoagulation responsible Ojani Berenson: Eden Emms MD, Theron Arista.  INR POC: 2.0.  Exp: 06/2011.    Anticoagulation Management Assessment/Plan:      The patient's current anticoagulation dose is Coumadin 5 mg tabs: Take as directed by Anticoagulation Clinic.  The target INR is 2.0-3.0.  The next INR is due 08/31/2010.  Anticoagulation instructions were given to patient.  Results were reviewed/authorized by [name], PharmD.  He was notified by [name] PharmD.         Prior Anticoagulation Instructions: INR 1.9 Resume normal schedule of 1 tablet on monday, wednesday, and friday. And 1.5 tablets all other days. You can stop the lovenox shots. Recheck in 2 weeks.   Current Anticoagulation Instructions: INR 2.0  Continue same dose of 1 1/2 tablets every day except 1 tablet on Monday, Wednesday and Friday.  Recheck  INR in 4 weeks.

## 2010-10-19 NOTE — Medication Information (Signed)
Summary: rov/ewj  Anticoagulant Therapy  Managed by: [name], PharmD Referring MD: Olga Millers MD PCP: Marga Melnick MD Supervising MD: Gala Romney MD, Reuel Boom Indication 1: Atrial Fibrillation (ICD-427.31) Indication 2: stroke-embolic (ICD-436.0) Lab Used: Primary school teacher Site: [address] INR POC 2.4 INR RANGE 2 - 3  Dietary changes: no    Health status changes: no    Bleeding/hemorrhagic complications: no    Recent/future hospitalizations: no    Any changes in medication regimen? no    Recent/future dental: no  Any missed doses?: no       Is patient compliant with meds? yes       Allergies: No Known Drug Allergies  Anticoagulation Management History:      Positive risk factors for bleeding include an age of 79 years or older.  The bleeding index is 'intermediate risk'.  Positive CHADS2 values include History of HTN.  Negative CHADS2 values include Age > 64 years old.  The start date was 09/29/2004.  His last INR was 3.6 ratio.  Anticoagulation responsible provider: Bensimhon MD, Reuel Boom.  INR POC: 2.4.  Exp: 06/2011.    Anticoagulation Management Assessment/Plan:      The patient's current anticoagulation dose is Coumadin 5 mg tabs: Take as directed by Anticoagulation Clinic.  The target INR is 2.0-3.0.  The next INR is due 08/03/2010.  Anticoagulation instructions were given to patient.  Results were reviewed/authorized by [name], PharmD.  He was notified by [name] PharmD.         Prior Anticoagulation Instructions: INR 1.9  Take 2 tablets today, then resume same dosage 1.5 tablets daily except 1 tablet on Mondays, Wednesdays, and Fridays. Recheck in 4 weeks.    Current Anticoagulation Instructions: INR 2.4  Continue same dose of 1 1/2 tablets every day except 1 tablet on Monday, Wednesday and Friday. Recheck INR in 4 weeks.

## 2010-10-19 NOTE — Progress Notes (Signed)
Summary: refill  Phone Note Refill Request Message from:  Patient on December 21, 2009 2:11 PM  Refills Requested: Medication #1:  ASACOL 400 MG  TBEC Take 1 tablet by mouth two times a day [facility] [phone]  Initial call taken by: Judie Grieve,  December 21, 2009 2:11 PM  Follow-up for Phone Call        since this is not a cardiac medication i could not refill it. Called the pt and told them to ask primary care Follow-up by: Kem Parkinson,  December 21, 2009 3:33 PM

## 2010-10-19 NOTE — Progress Notes (Signed)
Summary: lmtc 4-26-Biospy Concerns  Phone Note Call from Patient Call back at [phone]   Caller: Spouse Summary of Call: Message left on VM : Patient was told by Dr.Hopper that he would speak with Dr.Bravery Ketcham about patient having a biospy done and would like a status update, please call.   Dr.Hopper please advise./[facility]  January 11, 2010 9:36 AM   Follow-up for Phone Call        I'm sorry; I have  now contacted him as to need for coming off coumadin.He decide whether or not another blood thinner coverage will be necessary. Follow-up by: Marga Melnick MD,  January 12, 2010 7:42 AM  Additional Follow-up for Phone Call Additional follow up Details #1::        tried to call pt VM not set up will try again later...........Marland KitchenFelecia Deloach CMA  January 12, 2010 8:32 AM     Additional Follow-up for Phone Call Additional follow up Details #2::    Will need lovenox bridge while off coumadin; let coumadin clinic know [name], MD, [facility]  January 12, 2010 8:37 AM  spoke with pt spouse, she is aware that pt will need lovenox. will foward to the coumadin clinic and dr hopper Deliah Goody, RN  January 12, 2010 8:52 AM   Called spoke with pt's wife.  Made aware pt will need Lovenox bridge while off coumadin. No date set up for biopsy at present.  Wife will call back with date once biopsy scheduled and we will made a plan for stop dates of coumadin and start of Lovenox.   Follow-up by: Cloyde Reams RN,  January 12, 2010 11:24 AM

## 2010-10-19 NOTE — Medication Information (Signed)
Summary: rov/eac  Anticoagulant Therapy  Managed by: Eda Keys, PharmD Referring MD: Olga Millers MD PCP: Marga Melnick MD Supervising MD: Myrtis Ser MD, Tinnie Gens Indication 1: Atrial Fibrillation (ICD-427.31) Indication 2: stroke-embolic (ICD-436.0) Lab Used: Primary school teacher Site: [address] INR POC 1.7 INR RANGE 2 - 3  Dietary changes: no    Health status changes: no    Bleeding/hemorrhagic complications: yes       Details: Patient has a couple scrapes on his arm that are bleeding moderately.  I have cleaned these and replaced the bandage.  I have asked patient to clean this again with something mild and replace the bandage again.     Recent/future hospitalizations: no    Any changes in medication regimen? no    Recent/future dental: no  Any missed doses?: no       Is patient compliant with meds? yes      Comments: Patient was here 3 days ago and has taken 1.5 tablets daily since then.  INR today remains subtherapeutic.  Will boost again and continue lovenox until finished.    Allergies: No Known Drug Allergies  Anticoagulation Management History:      The patient is taking warfarin and comes in today for a routine follow up visit.  Positive risk factors for bleeding include an age of 73 years or older.  The bleeding index is 'intermediate risk'.  Positive CHADS2 values include History of HTN.  Negative CHADS2 values include Age > 52 years old.  The start date was 09/29/2004.  His last INR was 3.6 ratio.  Anticoagulation responsible provider: Myrtis Ser MD, Tinnie Gens.  INR POC: 1.7.  Cuvette Lot#: [id number].  Exp: 04/2011.    Anticoagulation Management Assessment/Plan:      The patient's current anticoagulation dose is Coumadin 5 mg tabs: Take as directed by Anticoagulation Clinic.  The target INR is 2.0-3.0.  The next INR is due 02/19/2010.  Anticoagulation instructions were given to patient.  Results were reviewed/authorized by Eda Keys, PharmD.  He was  notified by Eda Keys.         Prior Anticoagulation Instructions: INR 1.4  Take 1.5 tablets today and tomorrow.  Then return to normal dosing schedue of Thursday of 1.5 tablets on Sunday, Tuesday, and Thursday and 1 tablet all other days.  Return to clinic on friday.   Current Anticoagulation Instructions: INR 1.7  Take 1.5 tablets today and tomorrow.  Then return to normal dosing schedule of 1.5 tablets on Sunday, Tuesday, and Thursday and 1 tablet all other days.  Return to clinic in 7 days.

## 2010-10-19 NOTE — Procedures (Signed)
Summary: Flexible Sigmoidoscopy  Patient: [name] Note: All result statuses are Final unless otherwise noted.  Tests: (1) Flexible Sigmoidoscopy (FLX)  FLX Flexible Sigmoidoscopy                             DONE     [facility]     [address]     [place], [place]  [address]           FLEXIBLE SIGMOIDOSCOPY PROCEDURE REPORT           PATIENT:  [name], [name]  MR#:  [id number]     BIRTHDATE:  [birth year], 74 yrs. old  GENDER:  male           ENDOSCOPIST:  Iva Boop, MD, [facility]           PROCEDURE DATE:  03/19/2010     PROCEDURE:  Flexible Sigmoidoscopy, diagnostic     ASA CLASS:  Class III     INDICATIONS:  ulcerative colitis reassess segmental colitis           MEDICATIONS:   Fentanyl 25 mcg IV, Versed 2 mg IV           DESCRIPTION OF PROCEDURE:   After the risks benefits and     alternatives of the procedure were thoroughly explained, informed     consent was obtained.  Digital rectal exam was performed and     revealed no rectal masses and that the prostate was surgically     absent.   The [id number] endoscope was introduced     through the anus and advanced to the splenic flexure, without     limitations.  The quality of the prep was adequate.  The     instrument was then slowly withdrawn as the mucosa was fully     examined.     <<PROCEDUREIMAGES>>           Colitis was found left colon 25-35 cm segment of granular and     erythematous mucosa with some edema and submucosal heme, in area     of diverticulosis.  Severe diverticulosis was found in the sigmoid     colon.  The examination was otherwise normal.   Retroflexed views     in the rectum revealed medium hemorrhoids.    The scope was then     withdrawn from the patient and the procedure terminated.           COMPLICATIONS:  None           ENDOSCOPIC IMPRESSION:     1) Colitis in the left colon - segmental     diverticulosis-associated colitis     2) Severe diverticulosis in the  sigmoid colon     3) Medium internal hemorrhoids     4) Otherwise normal examination.     RECOMMENDATIONS:     He will need to stay on an aminosalicylate compound to reduce     risk of symptoms and bleeding from colitis.           REPEAT EXAM:  as needed           Iva Boop, MD, [facility]           CC:  The Patient           n.     eSIGNED:   Iva Boop at 03/19/2010 08:57 AM  [name], [name], [id number]  Note: An exclamation mark (!) indicates a result that was not dispersed into the flowsheet. Document Creation Date: 03/19/2010 8:58 AM _______________________________________________________________________  (1) Order result status: Final Collection or observation date-time: 03/19/2010 08:47 Requested date-time:  Receipt date-time:  Reported date-time:  Referring Physician:   Ordering Physician: Stan Head [phone]) Specimen Source:  Source: Launa Grill Order Number: [id number] Lab site:   Appended Document: Flexible Sigmoidoscopy Let him know we are changing Lialdato sulfasalazine new Rx sent he needs a CBC now and in 2 weeks watch out for any rashes, body changes that may be side effects as with any Rx, possible Coumadin interaction so have INR checked within 1-2 weeks aof starting and let's notify his Coumadin clinic   Appended Document: Flexible Sigmoidoscopy Patient 's waife aware.  He has enough Lialda samples to last through the end of the week.  She will bring him for a CBC tomorrow.  I explained that he will need to come for a CBC 2 weeks after starting on sulfasalazine and also check with the coumadin clinic about INR check.  Patient  sees [facility] for his coumadin.  I will forward a copy of this to them.

## 2010-10-19 NOTE — Medication Information (Signed)
Summary: rov/tm  Anticoagulant Therapy  Managed by: Shelby Dubin, PharmD, BCPS, CPP Referring MD: Olga Millers MD PCP: Marga Melnick Supervising MD: Gala Romney MD, Reuel Boom Indication 1: Atrial Fibrillation (ICD-427.31) Indication 2: stroke-embolic (ICD-436.0) Lab Used: Primary school teacher Site: [address] INR POC 2.6 INR RANGE 2 - 3  Dietary changes: no    Health status changes: no    Bleeding/hemorrhagic complications: no    Recent/future hospitalizations: no    Any changes in medication regimen? no    Recent/future dental: no  Any missed doses?: no       Is patient compliant with meds? yes       Allergies (verified): No Known Drug Allergies  Anticoagulation Management History:      The patient is taking warfarin and comes in today for a routine follow up visit.  Positive risk factors for bleeding include an age of 73 years or older.  The bleeding index is 'intermediate risk'.  Positive CHADS2 values include History of HTN.  Negative CHADS2 values include Age > 58 years old.  The start date was 09/29/2004.  His last INR was 3.6 ratio.  Anticoagulation responsible provider: Sible Straley MD, Reuel Boom.  INR POC: 2.6.  Cuvette Lot#: [id number].  Exp: 01/2011.    Anticoagulation Management Assessment/Plan:      The patient's current anticoagulation dose is Coumadin 5 mg tabs: Take as directed by Anticoagulation Clinic.  The target INR is 2.0-3.0.  The next INR is due 12/15/2009.  Anticoagulation instructions were given to patient.  Results were reviewed/authorized by Shelby Dubin, PharmD, BCPS, CPP.  He was notified by Shelby Dubin PharmD, BCPS, CPP.         Prior Anticoagulation Instructions: INR 2.2  Continue 5mg s everyday except 7.5mg  on Tuesdays, Thursdays and Sundays. Recheck in 4 weeks.   Current Anticoagulation Instructions: INR 2.6  Continue 1.5 tabs Sunday, Tuesday, Thursday. Continue 1 tab on Monday, Wednesday, Friday, Saturday.  Recheck in 4 weeks.

## 2010-10-19 NOTE — Progress Notes (Signed)
Summary: medication refill  Phone Note Call from Patient Call back at Home Phone [phone]   Caller: Patient Call For: Dr. Leone Payor Reason for Call: Refill Medication Summary of Call: pt needs his Asacol refilled Initial call taken by: Karna Christmas,  December 18, 2009 1:03 PM  Follow-up for Phone Call        spoke to patient's wife.. pt is doing well.  I advised her that Mr. Borgen is due for a follow up visit, but I would refill until his appt.  She is agreeable and an appt is scheduled  for 01/22/10.  Asacol refill sent to pharmacy. Follow-up by: Francee Piccolo CMA Duncan Dull),  December 21, 2009 5:08 PM    Prescriptions: ASACOL 400 MG  TBEC (MESALAMINE) Take 1 tablet by mouth two times a day  #60 x 1   Entered by:   Francee Piccolo CMA (AAMA)   Authorized by:   Iva Boop MD, [facility]   Signed by:   Francee Piccolo CMA (AAMA) on 12/21/2009   Method used:   Electronically to        [facility]  [address] (retail)       [address]       [place], [place]  [address]       Ph: [phone]       Fax: [phone]   RxID:   [id number]

## 2010-10-19 NOTE — Medication Information (Signed)
Summary: rov/sl  Anticoagulant Therapy  Managed by: Lyna Poser, PharmD Referring MD: Olga Millers MD PCP: Marga Melnick MD Supervising MD: nahser. 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 no    Recent/future dental: no  Any missed doses?: no       Is patient compliant with meds? yes       Allergies: No Known Drug Allergies  Anticoagulation Management History:      The patient is taking warfarin and comes in today for a routine follow up visit.  Positive risk factors for bleeding include an age of 27 years or older.  The bleeding index is 'intermediate risk'.  Positive CHADS2 values include History of HTN.  Negative CHADS2 values include Age > 61 years old.  The start date was 09/29/2004.  His last INR was 3.6 ratio.  Anticoagulation responsible provider: nahser. Philip.  INR POC: 1.9.  Cuvette Lot#: [id number].  Exp: 06/2011.    Anticoagulation Management Assessment/Plan:      The patient's current anticoagulation dose is Coumadin 5 mg tabs: Take as directed by Anticoagulation Clinic.  The target INR is 2.0-3.0.  The next INR is due 07/20/2010.  Anticoagulation instructions were given to patient.  Results were reviewed/authorized by Lyna Poser, PharmD.         Prior Anticoagulation Instructions: INR 1.3  Injection today.  Take Coumadin 2 tablets today.  Start Lovenox and Coumadin tomorrow.  Lovenox 150mg  once daily. Take 10mg  of Coumadin on Saturday and Sunday and 7.5mg  on Monday.  Recheck INR on Tuesday.   Current Anticoagulation Instructions: INR 1.9 Resume normal schedule of 1 tablet on monday, wednesday, and friday. And 1.5  tablets all other days. You can stop the lovenox shots. Recheck in 2 weeks.

## 2010-10-19 NOTE — Medication Information (Signed)
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 no    Recent/future dental: no  Any missed doses?: no       Is patient compliant with meds? yes       Allergies (verified): No Known Drug Allergies  Anticoagulation Management History:      The patient is taking warfarin and comes in today for a routine follow up visit.  Positive risk factors for bleeding include an age of 73 years or older.  The bleeding index is 'intermediate risk'.  Positive CHADS2 values include History of HTN.  Negative CHADS2 values include Age > 72 years old.  The start date was 09/29/2004.  His last INR was 3.6 ratio.  Anticoagulation responsible provider: Clifton Earle MD, Cristal Deer.  INR POC: 2.3.  Cuvette Lot#: [id number].  Exp: 01/2011.    Anticoagulation Management Assessment/Plan:      The patient's current anticoagulation dose is Coumadin 5 mg tabs: Take as directed by Anticoagulation Clinic.  The target INR is 2.0-3.0.  The next INR is due 01/12/2010.  Anticoagulation instructions were given to patient.  Results were reviewed/authorized by Cloyde Reams, RN, BSN.  He was notified by Cloyde Reams RN.         Current Anticoagulation Instructions: INR 2.3  Continue on same dosage 1 tablet daily except 1.5 tablets on Sundays, Tuesdays, and Thursdays.  Recheck in 4 weeks.

## 2010-10-19 NOTE — Letter (Signed)
Summary: Previsit letter  [facility]  [address] [place], [place] [address]   Phone: [phone]  Fax: [phone]       02/25/2010 MRN: [id number]  [name] [address] [place], [place]  [address]  Dear [name],  Welcome to the Gastroenterology Division at [facility].    You are scheduled to see a nurse for your pre-procedure visit on March 08, 2010 at 1:00pm  on the 3rd floor at [facility], [address]  We ask that you try to arrive at our office 15 minutes prior to your appointment time to allow for check-in.  Your nurse visit will consist of discussing your medical and surgical history, your immediate family medical history, and your medications.    Please bring a complete list of all your medications or, if you prefer, bring the medication bottles and we will list them.  We will need to be aware of both prescribed and over the counter drugs.  We will need to know exact dosage information as well.  If you are on blood thinners (Coumadin, Plavix, Aggrenox, Ticlid, etc.) please call our office today/prior to your appointment, as we need to consult with your physician about holding your medication.   Please be prepared to read and sign documents such as consent forms, a financial agreement, and acknowledgement forms.  If necessary, and with your consent, a friend or relative is welcome to sit-in on the nurse visit with you.  Please bring your insurance card so that we may make a copy of it.  If your insurance requires a referral to see a specialist, please bring your referral form from your primary care physician.  No co-pay is required for this nurse visit.     If you cannot keep your appointment, please call [phone] to cancel or reschedule prior to your appointment date.  This allows [place] the opportunity to schedule an appointment for another patient in need of care.    Thank you for choosing [facility] for your medical  needs.  We appreciate the opportunity to care for you.  Please visit [place] at our website  to learn more about our practice.                     Sincerely.                                                                                                                   The Gastroenterology Division   *Your procedure is scheduled for March 17, 2010.  If you run out of Lialda prior to your procedure please let me know and I can get you more samples.*

## 2010-10-19 NOTE — Medication Information (Signed)
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 no    Recent/future dental: no  Any missed doses?: yes     Details: holding Coumadin for spinal injection today.  Started Lovenox yesterday  Is patient compliant with meds? yes       Allergies: No Known Drug Allergies  Anticoagulation Management History:      The patient is taking warfarin and comes in today for a routine follow up visit.  Positive risk factors for bleeding include an age of 61 years or older.  The bleeding index is 'intermediate risk'.  Positive CHADS2 values include History of HTN.  Negative CHADS2 values include Age > 37 years old.  The start date was 09/29/2004.  His last INR was 3.6 ratio.  Anticoagulation responsible Kyrus Hyde: Bensimhon MD, Reuel Boom.  INR POC: 1.3.  Exp: 06/2011.    Anticoagulation Management Assessment/Plan:      The patient's current anticoagulation dose is Coumadin 5 mg tabs: Take as directed by Anticoagulation Clinic.  The target INR is 2.0-3.0.  The next INR is due 07/20/2010.  Anticoagulation instructions were given to patient.  Results were reviewed/authorized by [name], PharmD.  He was notified by [name] PharmD.         Prior Anticoagulation Instructions: INR 1.8  Start Lovenox today.  Inject Lovenox twice daily (12 hours apart) on Thursday. Do not take Lovenox on Friday morning. Start back Lovenox and Coumadin on Friday evening. Take Lovenox twice daily (12 hours apart) until return to clinic on Tuesday. Take Coumadin 1.5  tabs (7.5 mg) on Friday evening. Take Coumadin 2 tabs (10 mg) on Saturday. Take Coumadin 2 tabs on Sunday. Then, continue resume regular Coumadin schedule. Return to clinic Tuesday, November 1st.     Current Anticoagulation Instructions: INR 1.3  Injection today.  Take Coumadin 2 tablets today.  Start Lovenox and Coumadin tomorrow.  Lovenox 150mg  once daily. Take 10mg  of Coumadin on Saturday and Sunday and 7.5mg  on Monday.  Recheck INR on Tuesday.

## 2010-10-19 NOTE — Assessment & Plan Note (Signed)
Summary: discuss thyriod labs//lch   Vital Signs:  Patient profile:   75 year old male Weight:      208.4 pounds Temp:     98.8 degrees F oral Pulse rate:   94 / minute Resp:     16 per minute BP sitting:   126 / 78  (left arm) Cuff size:   large  Vitals Entered By: Shonna Chock (December 23, 2009 11:47 AM) CC: Follow-up visit: discuss labs and refill all meds ([facility]) Comments REVIEWED MED LIST, PATIENT AGREED DOSE AND INSTRUCTION CORRECT    Primary Care Provider:  Marga Melnick  CC:  Follow-up visit: discuss labs and refill all meds [facility]).  History of Present Illness: Incidental 2 cm  thyroid nodule on R on chest CT. TFTs WNL.He is asymptomatic except for back pain & hip bursitis being treated by Dr Ranell Patrick. Last PT/INR 2 weeks ago was 2.3.  Allergies (verified): No Known Drug Allergies  Review of Systems General:  Denies fatigue and weight loss. Eyes:  Denies blurring, double vision, and vision loss-both eyes. ENT:  Denies difficulty swallowing and hoarseness. CV:  "heart always skips". GI:  Denies constipation and diarrhea. Derm:  Denies changes in nail beds, dryness, and hair loss. Neuro:  Denies numbness and tingling. Endo:  Complains of cold intolerance; denies heat intolerance; on Coumadin.  Physical Exam  General:  well-nourished,in no acute distress; alert,appropriate and cooperative throughout examination Eyes:  No corneal or conjunctival inflammation noted. No lid lag Neck:  No deformities, masses, or tenderness noted. thyroid is SS Heart:  regular rhythm, no murmur, and bradycardia.  Split S1 Neurologic:  alert & oriented X3 and DTRs symmetrical and 0-1/2+. No tremor Cervical Nodes:  No lymphadenopathy noted Axillary Nodes:  No palpable lymphadenopathy   Impression & Recommendations:  Problem # 1:  THYROID NODULE, RIGHT (ICD-241.0) Incidental finding on chest CT  Complete Medication List: 1)  Diltiazem Hcl Cr 240 Mg Cp24 (Diltiazem  hcl) .... Take 1 tablet by mouth once a day 2)  Asacol 400 Mg Tbec (Mesalamine) .... Take 1 tablet by mouth two times a day 3)  Simvastatin 20 Mg Tabs (Simvastatin) .... Take 1 tablet by mouth once a day 4)  Clonazepam 0.5 Mg Tabs (Clonazepam) .... Take by mouth as needed 5)  Ferrous Sulfate 325 (65 Fe) Mg Tabs (Ferrous sulfate) .... Take 1 tablet by mouth once a day 6)  Proair Hfa 108 (90 Base) Mcg/act Aers (Albuterol sulfate) .... 2 puffs every 4-6 hours as needed 7)  Coumadin 5 Mg Tabs (Warfarin sodium) .... Take as directed by anticoagulation clinic 8)  Albuterol Sulfate (2.5 Mg/46ml) 0.083% Nebu (Albuterol sulfate) .Marland Kitchen.. 1 vial in nebulizer two times a day.  can take an additional 2 treatments if needed. 9)  Spiriva Handihaler 18 Mcg Caps (Tiotropium bromide monohydrate) .... One puff  in handihaler daily as needed 10)  Tramadol Hcl 50 Mg Tabs (Tramadol hcl) .Marland Kitchen.. 1 every 6 hrs as needed pain  Patient Instructions: 1)  I shall discuss stopping coumadin with Dr Jens Som to allow a FINE needle aspiration of the dominant 2 cm thyroid nodule. Trial of Tramadol as needed for back pain. Prescriptions: TRAMADOL HCL 50 MG TABS (TRAMADOL HCL) 1 every 6 hrs as needed pain  #30 x 1   Entered and Authorized by:   Marga Melnick MD   Signed by:   Marga Melnick MD on 12/23/2009   Method used:   Faxed to .[facility] Kitchen[facility] Kitchen  [facility]  [address] (retail)       [address]       [place], [place]  [address]       Ph: [phone]       Fax: [phone]   RxID:   [id number]

## 2010-10-19 NOTE — Assessment & Plan Note (Signed)
Summary: yearly/sl   Referring Provider:  Jens Som Primary Provider:  Marga Melnick MD   History of Present Illness: [name] is a  gentleman who has a history of mitral valve repair as well as atrial fibrillation/flutter.  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  He also has a history of a GI bleed as well as hemorrhagic bronchitis. Note cardiac catheterization in December of 2006 showed a 40-50% stenosis in the mid vessel but was otherwise without coronary disease. Last echocardiogram was performed in December of 2010 and showed an ejection fraction of 50-55%, moderate left atrial enlargement, mild right atrial and right ventricular enlargement, status post mitral valve repair with mild mitral stenosis and trace mitral regurgitation as well as trace aortic insufficiency. I last saw him in Nov 2010. Since then he has dyspnea with activities. It is relieved with rest. It is not associated with chest pain. There is no orthopnea, PND, pedal edema, palpitations, syncope, exertional chest pain or bleeding.   Current Medications (verified): 1)  Diltiazem Hcl Cr 240 Mg  Cp24 (Diltiazem Hcl) .... Take 1 Tablet By Mouth Once A Day 2)  Sulfasalazine 500 Mg Tabs (Sulfasalazine) .... Take 1 Tablet By Mouth Two Times A Day 3)  Simvastatin 20 Mg  Tabs (Simvastatin) .... Take 1 Tablet By Mouth Once A Day 4)  Clonazepam 0.5 Mg  Tabs (Clonazepam) .... Take By Mouth As Needed 5)  Ferrous Sulfate 325 (65 Fe) Mg  Tabs (Ferrous Sulfate) .... Take 1 Tablet By Mouth Once A Day 6)  Proair Hfa 108 (90 Base) Mcg/act  Aers (Albuterol Sulfate) .... 2 Puffs Every 4-6 Hours As Needed 7)  Coumadin 5 Mg Tabs (Warfarin Sodium) .... Take As Directed By Anticoagulation Clinic 8)  Albuterol Sulfate (2.5 Mg/46ml) 0.083% Nebu (Albuterol Sulfate) .Marland Kitchen.. 1 Vial in Nebulizer Two Times A Day.  Can Take An Additional 2 Treatments If Needed. 9)  Spiriva  Handihaler 18 Mcg  Caps (Tiotropium Bromide Monohydrate) .... One Puff  in Handihaler Daily As Needed 10)  Tramadol Hcl 50 Mg Tabs (Tramadol Hcl) .Marland Kitchen.. 1 Every 6 Hrs As Needed Pain 11)  Endocet 5-325 Mg Tabs (Oxycodone-Acetaminophen) .... As Needed 12)  Lovenox 100 Mg/ml Soln (Enoxaparin Sodium) .... Inject 1 Syringe Subcutaneously Every Twelve Hours.  Allergies: No Known Drug Allergies  Past History:  Past Medical History: MEDIASTINAL LYMPHADENOPATHY (ICD-785.6) Prostate cancer, hx of Atrial Fibrillation COPD CAD (40 to 50 RCA by cath. 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 Mitral Valve repair and modified Cox-Maze IV- 09/22/04 Redo MV repair due to endocarditis/embolic events - 09/14/05 Prostatectomy Hernia Surgery right inguinal Cataract Extraction Left parotid resection  Social History: Reviewed history from 07/20/2008 and no changes required. Patient states former smoker.  pt is married. pt has children. pt is a retired Visual merchandiser. Alcohol Use - no  Review of Systems       Some gait imbalance but no fevers or chills, productive cough, hemoptysis, dysphasia, odynophagia, melena, hematochezia, dysuria, hematuria, rash, seizure activity, orthopnea, PND, pedal edema, claudication. Remaining systems are negative.   Vital Signs:  Patient profile:   75 year old male Height:      70 inches Weight:      199 pounds BMI:     28.66 Pulse rate:   97 /  minute Resp:     14 per minute BP sitting:   127 / 83  (left arm)  Vitals Entered By: Kem Parkinson (August 03, 2010 8:13 AM)  Physical Exam  General:  Well-developed well-nourished in no acute distress.  Skin is warm and dry.  HEENT is normal.  Neck is supple. No thyromegaly.  Chest is clear to  auscultation with normal expansion.  Cardiovascular exam is irregular Abdominal exam nontender or distended. No masses palpated. Extremities show no edema. neuro grossly intact    EKG  Procedure date:  08/03/2010  Findings:      Atrial fibrillation at a rate of 97. Occasional PVCs or aberrantly conducted beats. Left posterior fasicular block.  Impression & Recommendations:  Problem # 1:  ESSENTIAL HYPERTENSION, BENIGN (ICD-401.1)  Blood pressure controlled on present medications.we'll continue. His updated medication list for this problem includes:    Diltiazem Hcl Cr 240 Mg Cp24 (Diltiazem hcl) .Marland Kitchen... Take 1 tablet by mouth once a day  His updated medication list for this problem includes:    Diltiazem Hcl Cr 240 Mg Cp24 (Diltiazem hcl) .Marland Kitchen... Take 1 tablet by mouth once a day  Problem # 2:  MITRAL VALVE DISORDERS (ICD-424.0) Status post mitral valve repair. Repeat echocardiogram. Continued SBE prophylaxis. Orders: Echocardiogram (Echo)  Problem # 3:  HYPERLIPIDEMIA, FAMILIAL (ICD-272.0) Continue statin. Check lipids and liver. His updated medication list for this problem includes:    Simvastatin 20 Mg Tabs (Simvastatin) .Marland Kitchen... Take 1 tablet by mouth once a day  His updated medication list for this problem includes:    Simvastatin 20 Mg Tabs (Simvastatin) .Marland Kitchen... Take 1 tablet by mouth once a day  Problem # 4:  CAD (ICD-414.00) History of mild to moderate coronary disease. Continue statin. Not on aspirin as he is on Coumadin and has a history of hemoptysis. His updated medication list for this problem includes:    Diltiazem Hcl Cr 240 Mg Cp24 (Diltiazem hcl) .Marland Kitchen... Take 1 tablet by mouth once a day    Coumadin 5 Mg Tabs (Warfarin sodium) .Marland Kitchen... Take as directed by anticoagulation clinic    Lovenox 100 Mg/ml Soln (Enoxaparin sodium) ..... Inject 1 syringe subcutaneously every twelve hours.  Problem # 5:  ATRIAL FLUTTER (ICD-427.32)  Continue Coumadin and Cardizem. Goal INR  2-3. His updated medication list for this problem includes:    Coumadin 5 Mg Tabs (Warfarin sodium) .Marland Kitchen... Take as directed by anticoagulation clinic  His updated medication list for this problem includes:    Coumadin 5 Mg Tabs (Warfarin sodium) .Marland Kitchen... Take as directed by anticoagulation clinic  Problem # 6:  COUMADIN THERAPY (ICD-V58.61) Monitored in the Coumadin clinic.  Patient Instructions: 1)  Your physician recommends that you return for lab work FASTING 2)  Your physician wants you to follow-up in: ONE YEAR  You will receive a reminder letter in the mail two months in advance. If you don't receive a letter, please call our office to schedule the follow-up appointment. 3)  Your physician has requested that you have an echocardiogram.  Echocardiography is a painless test that uses sound waves to create images of your heart. It provides your doctor with information about the size and shape of your heart and how well your heart's chambers and valves are working.  This procedure takes approximately one hour. There are no restrictions for this procedure.

## 2010-10-19 NOTE — Medication Information (Signed)
Summary: [email or web address].mp  Anticoagulant Therapy  Managed by: Bethena Midget, RN, BSN Referring MD: Olga Millers MD PCP: Marga Melnick Supervising MD: Gala Romney MD, Reuel Boom Indication 1: Atrial Fibrillation (ICD-427.31) Indication 2: stroke-embolic (ICD-436.0) Lab Used: Primary school teacher Site: [address] INR POC 2.2 INR RANGE 2 - 3  Dietary changes: no    Health status changes: no    Bleeding/hemorrhagic complications: no    Recent/future hospitalizations: no    Any changes in medication regimen? no    Recent/future dental: no  Any missed doses?: no       Is patient compliant with meds? yes       Allergies: No Known Drug Allergies  Anticoagulation Management History:      The patient is taking warfarin and comes in today for a routine follow up visit.  Positive risk factors for bleeding include an age of 73 years or older.  The bleeding index is 'intermediate risk'.  Positive CHADS2 values include History of HTN.  Negative CHADS2 values include Age > 76 years old.  The start date was 09/29/2004.  His last INR was 3.6 ratio.  Anticoagulation responsible provider: America Sandall MD, Reuel Boom.  INR POC: 2.2.  Cuvette Lot#: [id number].  Exp: 10/2010.    Anticoagulation Management Assessment/Plan:      The patient's current anticoagulation dose is Coumadin 5 mg tabs: Take as directed by Anticoagulation Clinic.  The target INR is 2.0-3.0.  The next INR is due 11/17/2009.  Anticoagulation instructions were given to patient.  Results were reviewed/authorized by Bethena Midget, RN, BSN.  He was notified by Bethena Midget, RN, BSN.         Prior Anticoagulation Instructions: INR 2.0  Take 1 tablet daily except 1.5 tablets on Tuesdays, Thursdays, and Saturdays. Recheck 2/1.  Current Anticoagulation Instructions: INR 2.2  Continue 5mg s everyday except 7.5mg  on Tuesdays, Thursdays and Sundays. Recheck in 4 weeks.

## 2010-10-20 DIAGNOSIS — Z8679 Personal history of other diseases of the circulatory system: Secondary | ICD-10-CM

## 2010-10-20 DIAGNOSIS — I4892 Unspecified atrial flutter: Secondary | ICD-10-CM

## 2010-10-20 DIAGNOSIS — Z7901 Long term (current) use of anticoagulants: Secondary | ICD-10-CM | POA: Insufficient documentation

## 2010-10-21 NOTE — Letter (Signed)
Summary: Outpatient Coinsurance Notice  Outpatient Coinsurance Notice   Imported By: Marylou Mccoy 09/06/2010 18:59:03  _____________________________________________________________________  External Attachment:    Type:   Image     Comment:   External Document

## 2010-10-21 NOTE — Medication Information (Signed)
Summary: rov/sp  Anticoagulant Therapy  Managed by: [name], PharmD Referring MD: Olga Millers MD PCP: Marga Melnick MD Supervising MD: Shirlee Latch MD, Freida Busman Indication 1: Atrial Fibrillation (ICD-427.31) Indication 2: stroke-embolic (ICD-436.0) Lab Used: Primary school teacher Site: [address] INR POC 2.0 INR RANGE 2 - 3  Dietary changes: no    Health status changes: no    Bleeding/hemorrhagic complications: no    Recent/future hospitalizations: no    Any changes in medication regimen? no    Recent/future dental: no  Any missed doses?: yes     Details: missed 1 dose about 2 weeks ago  Is patient compliant with meds? yes       Allergies: No Known Drug Allergies  Anticoagulation Management History:      The patient is taking warfarin and comes in today for a routine follow up visit.  Positive risk factors for bleeding include an age of 75 years or older.  The bleeding index is 'intermediate risk'.  Positive CHADS2 values include History of HTN.  Negative CHADS2 values include Age > 98 years old.  The start date was 09/29/2004.  His last INR was 3.6 ratio.  Anticoagulation responsible provider: Shirlee Latch MD, [place].  INR POC: 2.0.  Cuvette Lot#: [id number].  Exp: 09/2011.    Anticoagulation Management Assessment/Plan:      The patient's current anticoagulation dose is Coumadin 5 mg tabs: Take as directed by Anticoagulation Clinic.  The target INR is 2.0-3.0.  The next INR is due 09/28/2010.  Anticoagulation instructions were given to patient.  Results were reviewed/authorized by [name], PharmD.  He was notified by [name] PharmD.         Prior Anticoagulation Instructions: INR 2.0  Continue same dose of 1 1/2 tablets every day except 1 tablet on Monday, Wednesday and Friday.  Recheck INR in 4 weeks.   Current Anticoagulation Instructions: Same as Prior Instructions.

## 2010-10-21 NOTE — Medication Information (Signed)
Summary: rov/sp  Anticoagulant Therapy  Managed by: Cloyde Reams, RN, BSN Referring MD: Olga Millers MD PCP: Marga Melnick MD Supervising MD: Hodges Treiber Indication 1: Atrial Fibrillation (ICD-427.31) Indication 2: stroke-embolic (ICD-436.0) Lab Used: Primary school teacher Site: [address] INR POC 2.5 INR RANGE 2 - 3  Dietary changes: no    Health status changes: no    Bleeding/hemorrhagic complications: no    Recent/future hospitalizations: no    Any changes in medication regimen? no    Recent/future dental: no  Any missed doses?: no       Is patient compliant with meds? yes       Allergies: No Known Drug Allergies  Anticoagulation Management History:      The patient is taking warfarin and comes in today for a routine follow up visit.  Positive risk factors for bleeding include an age of 73 years or older.  The bleeding index is 'intermediate risk'.  Positive CHADS2 values include History of HTN.  Negative CHADS2 values include Age > 73 years old.  The start date was 09/29/2004.  His last INR was 3.6 ratio.  Anticoagulation responsible provider: Monzerat Handler.  INR POC: 2.5.  Cuvette Lot#: [id number].  Exp: 10/2011.    Anticoagulation Management Assessment/Plan:      The patient's current anticoagulation dose is Coumadin 5 mg tabs: Take as directed by Anticoagulation Clinic.  The target INR is 2.0-3.0.  The next INR is due 10/26/2010.  Anticoagulation instructions were given to patient.  Results were reviewed/authorized by Cloyde Reams, RN, BSN.  He was notified by Cloyde Reams RN.         Prior Anticoagulation Instructions: INR 2.0  Continue same dose of 1 1/2 tablets every day except 1 tablet on Monday, Wednesday and Friday.  Recheck INR in 4 weeks.   Current Anticoagulation Instructions: INR 2.5  Continue on same dosage 1.5 tablets daily except 1 tablet on Mondays, Wednesdays, and Fridays.  Recheck in 4 weeks.

## 2010-10-21 NOTE — Letter (Signed)
Summary: Custom - Lipid  [facility], Main Office  [address]   [place], [place] [address]   Phone: [phone]  Fax: [phone]     August 31, 2010 MRN: [id number]   [name] [address] [place], [place]  [address]   Dear Mr. Gemmill,  We have reviewed your cholesterol results.  They are as follows:     Total Cholesterol:    164 (Desirable: less than 200)       HDL  Cholesterol:     31.10  (Desirable: greater than 40 for men and 50 for women)       LDL Cholesterol:       112  (Desirable: less than 100 for low risk and less than 70 for moderate to high risk)       Triglycerides:       107.0  (Desirable: less than 150)  Our recommendations include:These numbers look good. Continue on the same medicine. Liver function is normal. Take care, Dr. Darel Hong.    Call our office at the number listed above if you have any questions.  Lowering your LDL cholesterol is important, but it is only one of a large number of "risk factors" that may indicate that you are at risk for heart disease, stroke or other complications of hardening of the arteries.  Other risk factors include:   A.  Cigarette Smoking* B.  High Blood Pressure* C.  Obesity* D.   Low HDL Cholesterol (see yours above)* E.   Diabetes Mellitus (higher risk if your is uncontrolled) F.  Family history of premature heart disease G.  Previous history of stroke or cardiovascular disease    *These are risk factors YOU HAVE CONTROL OVER.  For more information, visit .  There is now evidence that lowering the TOTAL CHOLESTEROL AND LDL CHOLESTEROL can reduce the risk of heart disease.  The American Heart Association recommends the following guidelines for the treatment of elevated cholesterol:  1.  If there is now current heart disease and less than two risk factors, TOTAL CHOLESTEROL should be less than 200 and LDL CHOLESTEROL should be less than 100. 2.  If there is current heart disease or  two or more risk factors, TOTAL CHOLESTEROL should be less than 200 and LDL CHOLESTEROL should be less than 70.  A diet low in cholesterol, saturated fat, and calories is the cornerstone of treatment for elevated cholesterol.  Cessation of smoking and exercise are also important in the management of elevated cholesterol and preventing vascular disease.  Studies have shown that 30 to 60 minutes of physical activity most days can help lower blood pressure, lower cholesterol, and keep your weight at a healthy level.  Drug therapy is used when cholesterol levels do not respond to therapeutic lifestyle changes (smoking cessation, diet, and exercise) and remains unacceptably high.  If medication is started, it is important to have you levels checked periodically to evaluate the need for further treatment options.  Thank you,    [employer] Team

## 2010-10-26 ENCOUNTER — Encounter (INDEPENDENT_AMBULATORY_CARE_PROVIDER_SITE_OTHER): Payer: Medicare Other

## 2010-10-26 ENCOUNTER — Encounter: Payer: Self-pay | Admitting: Cardiology

## 2010-10-26 DIAGNOSIS — I4891 Unspecified atrial fibrillation: Secondary | ICD-10-CM

## 2010-10-26 DIAGNOSIS — Z7901 Long term (current) use of anticoagulants: Secondary | ICD-10-CM

## 2010-10-26 LAB — CONVERTED CEMR LAB: POC INR: 2.8

## 2010-11-04 NOTE — Medication Information (Signed)
Summary: Coumadin Clinic  Anticoagulant Therapy  Managed by: Georgina Pillion, PharmD Referring MD: Olga Millers MD PCP: Marga Melnick MD Supervising MD: Jens Som MD, Arlys John Indication 1: Atrial Fibrillation (ICD-427.31) Indication 2: stroke-embolic (ICD-436.0) Lab Used: Primary school teacher Site: [address] INR POC 2.8 INR RANGE 2 - 3  Dietary changes: no    Health status changes: no    Bleeding/hemorrhagic complications: no    Recent/future hospitalizations: no    Any changes in medication regimen? no    Recent/future dental: no  Any missed doses?: no       Is patient compliant with meds? yes       Allergies: No Known Drug Allergies  Anticoagulation Management History:      Positive risk factors for bleeding include an age of 73 years or older.  The bleeding index is 'intermediate risk'.  Positive CHADS2 values include History of HTN.  Negative CHADS2 values include Age > 62 years old.  The start date was 09/29/2004.  His last INR was 3.6 ratio.  Anticoagulation responsible provider: Jens Som MD, Arlys John.  INR POC: 2.8.  Exp: 10/2011.    Anticoagulation Management Assessment/Plan:      The patient's current anticoagulation dose is Coumadin 5 mg tabs: Take as directed by Anticoagulation Clinic.  The target INR is 2.0-3.0.  The next INR is due 11/23/2010.  Anticoagulation instructions were given to patient.  Results were reviewed/authorized by Georgina Pillion, PharmD.         Prior Anticoagulation Instructions: INR 2.5  Continue on same dosage 1.5 tablets daily except 1 tablet on Mondays, Wednesdays, and Fridays.  Recheck in 4 weeks.    Current Anticoagulation Instructions: Continue taking your coumadin as scheduled with 1 1/2 tablets (7.5 mg) daily except for 1 tablet (5 mg) on MWF.  INR 2.8

## 2010-11-23 ENCOUNTER — Encounter: Payer: Self-pay | Admitting: Cardiology

## 2010-11-23 ENCOUNTER — Encounter (INDEPENDENT_AMBULATORY_CARE_PROVIDER_SITE_OTHER): Payer: Medicare Other

## 2010-11-23 DIAGNOSIS — I4891 Unspecified atrial fibrillation: Secondary | ICD-10-CM

## 2010-11-23 DIAGNOSIS — Z7901 Long term (current) use of anticoagulants: Secondary | ICD-10-CM

## 2010-11-23 LAB — CONVERTED CEMR LAB: POC INR: 2.7

## 2010-11-30 NOTE — Medication Information (Addendum)
Summary: Coumadin Clinic  Anticoagulant Therapy  Managed by: Cloyde Reams, RN, BSN Referring MD: Olga Millers MD PCP: Marga Melnick MD Supervising MD: Patty Sermons Indication 1: Atrial Fibrillation (ICD-427.31) Indication 2: stroke-embolic (ICD-436.0) Lab Used: Primary school teacher Site: [address] INR POC 2.7 INR RANGE 2 - 3  Dietary changes: no    Health status changes: no    Bleeding/hemorrhagic complications: no    Recent/future hospitalizations: no    Any changes in medication regimen? no    Recent/future dental: no  Any missed doses?: no       Is patient compliant with meds? yes       Allergies: No Known Drug Allergies  Anticoagulation Management History:      The patient is taking warfarin and comes in today for a routine follow up visit.  Positive risk factors for bleeding include an age of 75 years or older.  The bleeding index is 'intermediate risk'.  Positive CHADS2 values include History of HTN.  Negative CHADS2 values include Age > 77 years old.  The start date was 09/29/2004.  His last INR was 3.6 ratio.  Anticoagulation responsible provider: Tiffanyann Deroo.  INR POC: 2.7.  Cuvette Lot#: [id number].  Exp: 09/2011.    Anticoagulation Management Assessment/Plan:      The patient's current anticoagulation dose is Coumadin 5 mg tabs: Take as directed by Anticoagulation Clinic.  The target INR is 2.0-3.0.  The next INR is due 12/21/2010.  Anticoagulation instructions were given to patient.  Results were reviewed/authorized by Cloyde Reams, RN, BSN.  He was notified by Cloyde Reams RN.         Prior Anticoagulation Instructions: Continue taking your coumadin as scheduled with 1 1/2 tablets (7.5 mg) daily except for 1 tablet (5 mg) on MWF.  INR 2.8  Current Anticoagulation Instructions: INR 2.7  Continue on same dosage 1.5 tablets daily except 1 tablet on MOndays, Wednesdays, and Fridays.  Recheck in 4 weeks.

## 2010-12-21 ENCOUNTER — Ambulatory Visit (INDEPENDENT_AMBULATORY_CARE_PROVIDER_SITE_OTHER): Payer: Medicare Other | Admitting: *Deleted

## 2010-12-21 DIAGNOSIS — Z8679 Personal history of other diseases of the circulatory system: Secondary | ICD-10-CM

## 2010-12-21 DIAGNOSIS — I4892 Unspecified atrial flutter: Secondary | ICD-10-CM

## 2010-12-21 DIAGNOSIS — Z7901 Long term (current) use of anticoagulants: Secondary | ICD-10-CM

## 2010-12-21 NOTE — Patient Instructions (Signed)
Continue on same dosage 1.5 tablets daily except 1 tablet on Mondays, Wednesdays, and Fridays.  Recheck in 4 weeks.

## 2011-01-18 ENCOUNTER — Ambulatory Visit (INDEPENDENT_AMBULATORY_CARE_PROVIDER_SITE_OTHER): Payer: Medicare Other | Admitting: *Deleted

## 2011-01-18 DIAGNOSIS — Z7901 Long term (current) use of anticoagulants: Secondary | ICD-10-CM

## 2011-01-18 DIAGNOSIS — I4892 Unspecified atrial flutter: Secondary | ICD-10-CM

## 2011-01-18 DIAGNOSIS — Z8679 Personal history of other diseases of the circulatory system: Secondary | ICD-10-CM

## 2011-02-01 NOTE — Assessment & Plan Note (Signed)
[facility]                            CARDIOLOGY OFFICE NOTE   RANVIR, RENOVATO                       MRN:          [id number]  DATE:04/23/2008                            DOB:          12/01/1935    HISTORY OF PRESENT ILLNESS:  Mr. [name] is a very pleasant gentleman who  has a history of mitral valve repair as well as atrial  fibrillation/flutter.  Note, he had a previous embolic CVA after his  initial mitral valve repair secondary to endocarditis and this required  redo surgery.  He also has a history of recent GI bleeding and  hemorrhagic bronchitis that had resolved.  When I last saw him on Feb 08, 2008, we did resume his Coumadin.  Since then, he states he feels  the best he has had in a long time.  He does have chronic dyspnea on  exertion, which is unchanged.  There is no orthopnea, PND, pedal edema,  palpitations, presyncope, syncope, or exertional chest pain.  He denies  any hemoptysis, hematochezia, but he does have dark stools, and he  attributes this to iron.   MEDICATIONS:  1. Asacol 400-mg tablets 2 p.o. t.i.d.  2. Cardizem 240 mg p.o. daily.  3. Iron.  4. Lasix 20 mg p.o. daily.  5. Coumadin.   PHYSICAL EXAMINATION:  VITAL SIGNS:  Shows a blood pressure of 136/84  and his pulse is 76.  He weighs 201 pounds.  HEENT:  Normal.  NECK:  Supple.  CHEST:  Clear.  CARDIOVASCULAR:  Reveals a irregular rhythm.  ABDOMEN:  Shows no tenderness.  EXTREMITIES:  Show no edema.   His electrocardiogram shows probable atrial flutter.  There is no RV  conduction delay, and there are no significant ST changes.   DIAGNOSES:  1. History of mitral valve repair - Mr. Lomax had a repeat      echocardiogram on February 19, 2008 that showed an ejection fraction of      50-55%.  There was mild aortic insufficiency.  There was very mild      mitral stenosis status post mitral valve repair as well as trivial      mitral regurgitation.  There was  dilatation of the left atrium,      right ventricle, and right atrium.  He will continue with subacute      bacterial endocarditis prophylaxis.  Note, a previous BNP was      normal.  2. History of atrial fibrillation/flutter - We will continue on his      Coumadin for now.  He did have his finger pricked in the Coumadin      Clinic today, and there was concern about anemia based on the      result.  We will check a CBC to make sure that he has not become      anemic again, although there is no active bleeding by his report.      We will also repeat an INR.  We will continue with Cardizem for      rate control.  3. History of  gastrointestinal bleed.  4. History of question hemorrhagic bronchitis.  5. Hyperlipidemia - Mr. Ringenberg has discontinued his Zocor, as he      thought it was causing myalgias.  He will stay off this.  If he      improves, then we will try a different statin such as Pravachol or      Crestor.  6. History of cerebrovascular accident felt secondary to endocarditis.  7. History of hyperlipidemia.  8. History of 40-50% right coronary artery - We will not add aspirin      given his history of gastrointestinal bleeding, possible      hemorrhagic bronchitis, and the need for Coumadin.  I am hesitant      to continue both of these long term, given the history of bleeding      issues.  I will see him back in 6 months.     Madolyn Frieze Jens Som, MD, [facility]  Electronically Signed    BSC/MedQ  DD: 04/23/2008  DT: 04/24/2008  Job #: [id number]

## 2011-02-01 NOTE — Assessment & Plan Note (Signed)
[facility]                         GASTROENTEROLOGY OFFICE NOTE   MADS, BORGMEYER                       MRN:          [id number]  DATE:02/15/2007                            DOB:          1936-01-16    REQUESTING PHYSICIAN:  Titus Dubin. Alwyn Ren, MD,FACP,FCCP.   REASON FOR CONSULTATION:  Rectal bleeding.   ASSESSMENT:  A 75 year old white male with rectal bleeding that appears  to be coming from hemorrhoids, based on history and anoscopic exam  today.  It seems to have been worsened when his INR rose above 4 and was  in the mid 3's, as he has had what seems like two separate occasions.  Colonoscopy in October, 2004 showed his internal hemorrhoids, described  as large, sigmoid and descending colon diverticulosis, and no polyps.   RECOMMENDATIONS/PLANS:  1. Hydrocortisone suppositories 25 mg nightly for 7 nights and then as      needed, #14 refills.  2. Return to see me in one month or so to follow up on this.  If he      has persistent bleeding he may need a sigmoidoscopy or a complete      colonoscopy.  3. Fiber supplementation with Fibersure samples, and he can purchase      that.  He has had a little bit of difficulty with straining to      stool and some minimal change in his bowel habits, which seems to      be caused by sitting on his tractor for long periods of time.   HISTORY:  This patient has seen bright red blood per rectum several  times.  He saw Dr. Alwyn Ren.  His hemoglobin was normal at 14.1 on May  16th.  For two weeks, he has had an intermittent problem with his bright  red blood.  He has had some normal stools as well.  He has been riding  on his tractor a lot and has noted that he might skip a bowel movement  and then have to strain some.  There has really been no change in the  size of his stools.  He has not had any weight loss or any acute health  changes.  His INR was up over 4 the first time he had the bleeding, and  it  was about 3.3 the second time, he tells me.  Remote history of  colonoscopy four years ago, as noted.   PAST MEDICAL HISTORY:  1. Mitral valve repair for endocarditis, which was also manifested by      embolic strokes.  2. Chronic Coumadin therapy.  3. A fib/flutter in normal sinus rhythm after cardioversion.  4. Dyslipidemia.  5. Pulmonary embolism.  6. History of prostate cancer, status post resection (Dr. Retta Diones).  7. Dyslipidemia.  8. Previous smoker.  9. Previous history of Wharton's tumor.  10.History of adrenal adenoma.  11.Chronic obstructive pulmonary disease.  12.Degenerative joint disease.  13.Anxiety.  14.Erectile dysfunction.  15.Status post left parotidectomy of Wharton's tumor.  16.Status post right inguinal hernia repair.  17.Prior right cataract surgery.   MEDICATIONS:  1. Aspirin 81 mg  daily.  2. Vytorin 10/20 mg daily.  3. Coumadin per protocol.  4. Metoprolol 25 mg b.i.d.  5. Cefuroximaxetil, he is currently on for bronchitis.   No known drug allergies.   FAMILY HISTORY:  No colon cancer reported.  Prostate cancer apparently  in the family.   SOCIAL HISTORY:  The patient is married.  He is a Biomedical engineer.  He has a garden, and he has some hay.  He has two sons.  He is here with  his wife.   REVIEW OF SYSTEMS:  See my medical history form for full details of  that.   PHYSICAL EXAMINATION:  VITAL SIGNS:  Weight 204 pounds.  Pulse 70.  Occasional irregular beat.  Blood pressure 110/72.  GENERAL:  A well-developed elderly white male in no acute distress.  HEENT:  Eyes:  Anicteric.  Mouth:  Posterior pharynx free of lesions.  The hearing seems intact.  HEART:  S1 and S2.  No murmurs, rubs or gallops.  ABDOMEN:  Soft.  There is a small umbilical hernia just above the  umbilicus.  It is easily reducible.  It is minimally tender at times on  exam.  RECTAL: Brown stool.  There are some skin tags present.  There is no  mass.  An anoscopic  examination is performed that shows inflamed  hemorrhoids in the anal canal, consistent with internal hemorrhoids.  There are stigmata of recent bleeding with violaceous and red spots.  SKIN:  Sun-damaged with multiple lesions, consistent with actinic  lesions, nevi, and freckling and wrinkling.  PSYCH:  He is alert and oriented x3.   I appreciate the opportunity to care for this patient.  I have reviewed  the labs from Dr. Alwyn Ren, which also included a normal BMET.  His  platelets and white count are normal.  I have reviewed his most recent  cardiology note from Feb 02, 2007.  I have reviewed the notes that are  in the [facility] chart, including previous hospital discharge summaries.   I appreciate the opportunity to care for this patient.   NOTE:  Patient understands that if he is not responding to the  suppositories, he is to call me sooner.     Iva Boop, MD,FACG  Electronically Signed    CEG/MedQ  DD: 02/15/2007  DT: 02/16/2007  Job #: [id number]   cc:   Titus Dubin. Alwyn Ren, MD,FACP,FCCP

## 2011-02-01 NOTE — Assessment & Plan Note (Signed)
[facility]                            CARDIOLOGY OFFICE NOTE   BRYLER, DIBBLE                       MRN:          [id number]  DATE:11/03/2008                            DOB:          Jul 03, 1936    Mr. [name] is a 75 year old gentleman who has a history of mitral valve  repair as well as atrial fibrillation/flutter.  He also had previous  embolic CVA after his initial mitral valve repair secondary to  endocarditis that required redo surgery.  He also has a history of a GI  bleed as well as hemorrhagic bronchitis.  Since I last saw him, he does  have some dyspnea on exertion which has been a longstanding issue and  unchanged since April 23, 2008.  There is no orthopnea or PND.  There is  no pedal edema.  He has not had chest pain or syncope.  There is no  palpitations.   MEDICATIONS:  1. Asacol 400-mg tablets 2 p.o. t.i.d.  2. Cardizem 240 mg p.o. daily.  3. Iron.  4. Coumadin.  5. Zocor 20 mg p.o. daily.   PHYSICAL EXAMINATION:  VITAL SIGNS:  Today shows his blood pressure is  124/73 and his pulse is 78.  He weighs 207 pounds.  HEENT:  Normal.  NECK:  Supple.  CHEST:  Clear.  CARDIOVASCULAR:  Irregular rhythm.  ABDOMEN:  No tenderness.  EXTREMITIES:  No edema.   His electrocardiogram shows probable atrial flutter versus coarse atrial  fibrillation.  There are PVCs or aberrantly conducted beats.  There are  nonspecific inferior T-wave changes.   DIAGNOSES:  1. Status post mitral valve repair - Mr. Coats will continue with      subacute bacterial endocarditis prophylaxis.  I will see him back      in 9 months.  We will most likely repeat his echocardiogram at that      time.  2. History of atrial fibrillation/flutter - the patient has had a      cardioversion in the past but reverted to atrial arrhythmias.  We      will continue with his present medications for rate control as well      as Coumadin with goal INR of 2-3.  I will check  a CBC to follow his      hemoglobin and hematocrit.  3. Coumadin therapy - as per above.  He will be followed in our      Coumadin Clinic.  4. History of gastrointestinal bleed - he has had no recent bleeding.  5. History of question hemorrhagic bronchitis.  6. Hyperlipidemia - he will continue his Zocor and we will check      lipids and liver to adjust as indicated.  7. History of cerebrovascular accident felt secondary to endocarditis.  8. History of 40-50% right coronary artery - he will continue on his      statin.  He is not on aspirin as he has had a multiple problems      with bleeding in the past on this in addition to Coumadin.  I will  see him back in 9 months.     Madolyn Frieze Jens Som, MD, [facility]  Electronically Signed    BSC/MedQ  DD: 11/03/2008  DT: 11/04/2008  Job #: [id number]

## 2011-02-01 NOTE — Consult Note (Signed)
[name], Shaun NO.:  [name]   MEDICAL RECORD NO.:  [id number]          PATIENT TYPE:  INP   LOCATION:  1419                         FACILITY:  [facility]   PHYSICIAN:  Bevelyn Buckles. Bensimhon, MDDATE OF BIRTH:  03/24/1936   DATE OF CONSULTATION:  11/21/2007  DATE OF DISCHARGE:                                 CONSULTATION   REFERRING PHYSICIAN:  Valerie A. Felicity Coyer, MD   REASON FOR CONSULTATION:  Paroxysmal atrial fibrillation with rapid  ventricular response.   PRIMARY CARDIOLOGIST:  Madolyn Frieze. Jens Som, MD, [facility].   HISTORY OF PRESENT ILLNESS:  Mr. Resor is a very pleasant 75 year old  male with a history of paroxysmal atrial fibrillation and flutter.  He  also has a history of mitral valve repair x2 in the setting of previous  endocarditis.  He has been maintained on chronic Coumadin therapy.  He  also has a history of a previous embolic CVA, hypertension,  hyperlipidemia, COPD, and nonobstructive coronary artery disease.   He has been more short of breath over the last few weeks.  This  culminated over the last several days when he became markedly short of  breath with fevers, chills, productive cough,  wheezing and mild  hemoptysis.  He presented to the [facility] ER earlier this morning. He  was found to reportedly be in atrial fibrillation with rapid ventricular  response with heart rates of 140.  However on the EKG, it looks like  this may have been sinus tachycardia.  There is also evidence of volume  depletion.  He was given some fluids and started on Cardizem drip. Heart  rates quickly came down to the 70-90 range.  However, his blood pressure  was in the 80s, so it was stopped.   Chest x-ray did not show any acute infiltrates.  There was some  cardiomegaly and atelectasis with question of mild pulmonary vascular  congestion.  He is being admitted and empirically treated for a COPD  flare or acute bronchitis.  He is now starting to feel  better.   REVIEW OF SYSTEMS:  Notable for acute on chronic dyspnea, weakness,  fatigue, fevers, chills, hemoptysis, arthritis pain.  He has not had any  focal neurologic symptoms.  The remainder of the Review of Systems is as  per HPI and Problem List, otherwise negative.   PROBLEM LIST.:  1. Paroxysmal atrial fibrillation and flutter maintained on Coumadin.  2. History of cerebrovascular accident.  3. History of mitral valve repair x2 secondary to endocarditis.  4. Nonobstructive coronary artery disease.  5. Chronic obstructive pulmonary disease.  6. Obesity.  7. Hypertension.  8. Hyperlipidemia.   CURRENT MEDICATIONS:  1. Metoprolol 25 b.i.d.  2. Coumadin.  3. Vytorin 10/20.  4. Aspirin 81.  5. Protonix 40 a day.  6. Tylenol.  7. Solu-Medrol.  8. Avelox.  9. Mucinex.  10.Albuterol and Atrovent nebulizers.  11.He is also getting IV fluids.   ALLERGIES:  No known drug allergies.   SOCIAL HISTORY:  He is married.  He is a Biomedical engineer. Previously  smoked three packs a day  for 60 years, quit 5 years ago.  No alcohol.   FAMILY HISTORY:  Notable for prostate cancer.  Father died at 64 from  CVA.  Mother died from a spinal cancer.   PHYSICAL EXAMINATION:  GENERAL:  He is in no acute distress.  His  respirations are unlabored.  VITAL SIGNS:  Blood pressure is 113/77. His heart rate 150.  He is  saturating 94% on room air.  He is afebrile at 97.3.  HEENT:  Normal.  NECK:  Supple.  There is a scar on the left side due to removal of  previous salivary gland. There is no JVD.  Carotids are 1+ bilaterally  without bruits.  There is no lymphadenopathy or thyromegaly appreciated.  CARDIAC:  PMI is laterally displaced.  He is tachycardiac and mildly  irregular.  No obvious murmur.  LUNGS:  Diffuse rhonchi with no active wheezing or rales.  ABDOMEN:  Obese, nontender, nondistended.  There is no  hepatosplenomegaly, no bruits.  No masses.  Good bowel sounds.  EXTREMITIES:   Warm with no cyanosis, clubbing or edema.  No rash.  NEUROLOGIC:  Alert and oriented x3.  Cranial nerves II-XII are grossly  intact.  Moves all four extremities without difficulty.  Affect is  pleasant.   Labs show an INR of 2.7.  TSH is normal.  Strep pneumonia antigen is  normal. Cardiac panel showed a troponin of 0.05.  Magnesium is 147.  Sodium is 137, potassium 4.2, BUN 21, creatinine 1.75.  CBC:  White  count 8.7 with 88% left shift, hemoglobin 13.5, platelets of 115,000.   Telemetry shows atrial fibrillation between 110-115.  There are some P  waves.  I question whether or not there may be a component of multifocal  atrial tachycardia.   EKG currently shows atrial fibrillation at a rate of 98 with an  incomplete right bundle branch block.   ASSESSMENT:  1. Paroxysmal atrial fibrillation with rapid ventricular response plus      or minus multifocal atrial tachycardia.  2. Acute bronchitis with chronic obstructive pulmonary disease flare.  3. Hemoptysis, likely secondary to his bronchitis.  4. History of mitral valve repair x2 secondary to endocarditis.  5. Normal left ventricular function.  6. Volume depletion.   PLAN AND DISCUSSION:  I suspect his atrial fibrillation was triggered by  his upper respiratory tract infection.  Primary therapy for this will be  treatment of his respiratory tract infection as you are doing.  However,  he will also need rate control.  Given his respiratory status, would  hold his beta blocker for now and switch over to Diltiazem.  Also change albuterol to Xopenex.  Would consider holding his  anticoagulation for now until his hemoptysis improves.  Will follow with  you.   We appreciate the consult.      Bevelyn Buckles. Bensimhon, MD  Electronically Signed     DRB/MEDQ  D:  11/21/2007  T:  11/21/2007  Job:  [id number]

## 2011-02-01 NOTE — Assessment & Plan Note (Signed)
[facility]                            CARDIOLOGY OFFICE NOTE   Shaun, [name]                       MRN:          [id number]  DATE:02/02/2007                            DOB:          10/18/1935    [name] is a pleasant gentleman who has a history of mitral valve  repair as well as atrial fibrillation/flutter.  Note, his initial mitral  valve repair was in January, 2006.  He had embolic CVAs after the  procedure and was found to have endocarditis and subsequently underwent  redo mitral valve repair.  He has also had intermittent atrial  fibrillation and flutter and is status post cardioversion.  Since I last  saw him, he has mild dyspnea on exertion, but there is no orthopnea,  PND, pedal edema, palpitations, chest pain, or syncope.  He did state  that he had some hematochezia over the past two days.  He was seen by  Dr. Alwyn Ren earlier today and was prescribed a medicine for hemorrhoids  and sitz bath.  He also had his INR checked, which was 4.5.  He denies  any melena.  He states that his hematochezia is in the 1-2 teaspoon  range and only with a bowel movement.   CURRENT MEDICATIONS:  1. Aspirin 81 mg p.o. daily.  2. Vytorin 10/20 nightly.  3. Metoprolol 25 mg p.o. daily.  4. Coumadin as directed.   PHYSICAL EXAMINATION:  VITAL SIGNS:  Blood pressure 114/72, pulse 83.  GENERAL:  He is well-developed and well-nourished.  HEENT:  Normal.  NECK:  Supple.  There are no bruits noted.  CHEST:  Clear.  CARDIAC:  An irregular rhythm.  There is no systolic murmur at the apex.  ABDOMEN:  Benign.  EXTREMITIES:  No edema.   His electrocardiogram shows atrial flutter at a rate of 83.  There are  occasional PVCs or aberrantly conducted beats.  A prior inferior infarct  could not be excluded.   DIAGNOSES:  1. Status post mitral valve repair.  We will plan to schedule him to      have an echocardiogram to reassess his left ventricular function  as      well as his mitral valve repair.  2. Recurrent atrial flutter.  We will continue with Coumadin with a      goal INR of 2-3.  Note, he has been therapeutic for some time.  He      has developed symptoms with his atrial arrhythmias in the past and      will therefore schedule him for cardioversion.  If he has recurrent      atrial flutter in the future, then we will refer him for flutter      ablation; however, he would not be able to come off his Coumadin,      as he has had atrial fibrillation in the past as well.  His rate is      controlled on the Lopressor at present.  3. Hematochezia:  Dr. Alwyn Ren has taken care of this.  We will check a  hemoglobin and hematocrit to make sure that it is not low.  Note,      his INR is elevated today, and he was seen by the Coumadin clinic,      and his medications were adjusted.  I have also asked him to      discontinue his aspirin.  4. History of embolic cerebrovascular accidents:  He will continue on      Coumadin.  However, this was presumed related to his previous      endocarditis following his initial mitral valve repair.  5. Coumadin therapy:  As per above.  6. History of hyperlipidemia:  We will check lipids and liver and      adjust his medications as indicated.  7. Coronary artery disease:  The patient did have a previous 40-50%      stenosis in the right coronary artery prior to his surgery.  Our      goal LDL would be less than 70, given his history of coronary      disease.  8. We will see him back in eight weeks.     Madolyn Frieze Jens Som, MD, [facility]  Electronically Signed    BSC/MedQ  DD: 02/02/2007  DT: 02/02/2007  Job #: [id number]

## 2011-02-01 NOTE — Assessment & Plan Note (Signed)
[facility]                        [place] OFFICE NOTE   [name], [name]                       MRN:          [id number]  DATE:02/02/2007                            DOB:          1936/09/12    [name] (date of birth Feb 21, 1936) was seen emergently Feb 02, 2007 for rectal bleeding.   He began to have rectal bleeding with bowel movements May 15. He denied  any pain; he denied any straining at stool. A month ago he had rectal  bleeding that was in the context of straining. He is on Coumadin; his  INR 2 weeks ago was 2.7.   In October 2004 a colonoscopy revealed diverticulosis and internal  hemorrhoids. No polyps were noted.   He denies epistaxis, hematuria, or other bleeding.   Additionally for a week he has had a harsh cough productive of clear  sputum. His only shortness of breath is positionally related,ie when he  stoops or bends.   He had minimal yellow nasal discharge but this is a chronic problem. He  specifically denies fever, chills, sweats or signs of rhinosinusitis.   He denies any diet change in relationship to the Coumadin therapy.   He is on Coumadin because of a mitral valve thrombus following mitral  valve repair and MAZE surgery. He had previously had congestive heart  failure and atrial fibrillation in the setting of the mitral  regurgitation.   He is on Vytorin 10/20, baby aspirin and metoprolol 25 mg twice a day.  He has no known drug allergies.   VITAL SIGNS: His weight is stable at 206. Temperature is 96.8, pulse is  60 and regular, respiratory rate 15, blood pressure 120/84.  HEENT:  He has no icterus or jaundice. The nares are patent; oropharynx  reveals no significant erythema.  LYMPH:  He has no lymphadenopathy about the head, neck or axilla.  LUNGS:  He had minimal rales and minimal wheezing.  HEART:  He has splitting of the first heart sound. There are no  significant murmurs noted. He  is in no respiratory distress.  ABDOMEN:  Protuberant but nontender. No masses.  EXTREMITIES:  Homan sign is negative. Pedal pulses are intact.  RECTUM:  The prostate is absent surgically. Stool was slightly pink and  strongly Hemoccult positive.   Hemoglobin 14.3; PT INR 4.1.  In view of his clinical stability, the most likely etiology here is  hemorrhoidal bleeding. He will be asked to use Proctosol-HC and stay on  a liquid diet.   He will be placed on doxycycline to cover the atypical bronchitis and  given inhaled steroids to use along with Spiriva.   He is scheduled to see Dr. Jens Som this afternoon at 3:00; this report  should be available in e-chart for Dr. Jens Som as well.   If the bleeding persists despite these Interventions, then he would have  to be admitted for observation and colonoscopic evaluation.     Titus Dubin. Alwyn Ren, MD,FACP,FCCP  Electronically Signed    WFH/MedQ  DD: 02/02/2007  DT: 02/02/2007  Job #: [id number]

## 2011-02-01 NOTE — Assessment & Plan Note (Signed)
[facility]                            CARDIOLOGY OFFICE NOTE   FRANCISO, DIERKS                       MRN:          [id number]  DATE:12/17/2007                            DOB:          [birth year]    Mr. Turnley is a gentleman that I have seen in the past, status post  mitral valve repair, as well as atrial fibrillation and flutter.  He  also had an embolic CVA after his initial mitral valve repair, which was  felt to be secondary to endocarditis, and he required a re-do repair.  He was recently admitted to [facility] with hemoptysis and was  felt to have hemorrhagic bronchitis.  At that time, he was also in  atrial fibrillation, and his rate was elevated.  He had a chest CT that  showed bilateral basilar airspace disease with consolidation, which was  felt to represent possible pneumonia versus pneumonitis.  He also had  mild hilar, mediastinal and paraesophageal lymphadenopathy.  Follow-up  CT was recommended, and the report says to consider EGD.  Since  discharge, the patient has had a chronic cough.  However, he denies any  further hemoptysis.  He had does have some dyspnea on exertion which has  a chronic issue and unchanged.  There is no orthopnea, PND or pedal  edema.  He has not had chest pain.  He did state that he has had  abdominal pain when he coughs, and he developed a large ecchymotic area  recently.  He has also had hematochezia with clots this past Saturday.  He denies any dizziness with standing, and there is been no syncope.  He  has not had hematemesis, and he otherwise denies any abdominal pain.   MEDICATIONS:  His medications include:  1. Aspirin 81 mg daily.  2. Vytorin 10/20 daily.  3. Coumadin as directed.  4. Cardizem 240 mg p.o. daily.  5. Protonix 40 mg p.o. daily.  6. Niaspan 500 mg p.o. daily.  7. Symbicort.   PHYSICAL EXAMINATION:  VITAL SIGNS:  His physical exam today shows a  blood pressure of 120/70.   His pulse is 93.  Was 206 pounds.  HEENT:  Normal.  NECK:  Supple.  CHEST:  Clear.  CARDIOVASCULAR:  Reveals an irregular rhythm.  ABDOMEN:  His abdominal exam shows no tenderness to palpation.  There is  a large ecchymotic area surrounding into the left side of his umbilicus.  EXTREMITIES:  Show no edema.  The electrocardiogram shows atrial flutter  at a rate of 93.  There is left axis deviation, but no ST changes are  noted.   DIAGNOSES:  1. Hematochezia/ecchymosis over the abdomen - Mr. Vanbeek appears to be      having a GI bleed.  He states that he had significant amount of      clots in his shorts on Saturday.  He also has an ecchymosis around      his umbilicus, suggestive of a possible intra-abdominal bleed.  I      have discussed the patient with Dr. Alwyn Ren today.  He would  like to      admit the patient to his service for full evaluation.  We will      discontinue his aspirin and his Coumadin.  We will check serial      hemoglobin/hematocrits, and he will most likely need a GI      evaluation.  He will also most likely vitamin K, as well as a fresh      frozen plasma to reverse his anticoagulation.  He understands that      there will be a higher risk of left atrial thrombus and embolic      event, but we have no choice at this point.  We will discuss this      with the Internal Medicine team.  They will admit.  2. Status post mitral valve repair - He will continue the SBE      prophylaxis.  3. History of dyspnea - This appears to be a chronic issue and      unchanged.  Previous BNP was 76, when he was complaining of      dyspnea.  4. History of embolic cerebrovascular accident, felt secondary to      endocarditis.  5. Coumadin therapy - We will need to discontinue this at least for      now and consider resuming this in future.  6. History of hyperlipidemia - He will continue on his Statin.  7. History of 40% to 50% right coronary artery.   We will arrange for the  patient to follow up with me after his  discharge.     Madolyn Frieze Jens Som, MD, [facility]  Electronically Signed    BSC/MedQ  DD: 12/17/2007  DT: 12/17/2007  Job #: [id number]   cc:   Titus Dubin. Alwyn Ren, MD,FACP,FCCP

## 2011-02-01 NOTE — Assessment & Plan Note (Signed)
[facility]                            CARDIOLOGY OFFICE NOTE   AADIL, SUR                       MRN:          [id number]  DATE:02/08/2008                            DOB:          04-04-1936    [name] is a gentleman who has had previous mitral valve repairs as  well as atrial fibrillation and flutter.  He did have a previous embolic  CVA after his initial mitral valve repair, felt secondary to  endocarditis, and this required redo surgery.  In March he had  hemorrhagic bronchitis.  He also had a recent GI bleed and a colonoscopy  that showed colitis and diverticulosis.  When I last saw him on January 17, 2008, these issues had resolved.  We, therefore, resumed his  Coumadin.  However, he apparently developed recurrent hemoptysis,  coughing up approximately teaspoons of blood.  He was seen by Dr.  Shelle Iron, and his Coumadin was discontinued. Dr. Shelle Iron also apparently  put him on Lasix as he felt he may be volume overloaded despite a BNP of  54.  He did see him in followup after he stopped the Coumadin, and his  hemoptysis had resolved.  He also, per his notes, states that a chest x-  ray had significantly improved with resolution of pleural effusions.  Dr. Shelle Iron now wonders whether his previous pulmonary issues were  related to congestive heart failure.  Since that time, the patient's  hemoptysis has completely resolved.  He has also had no hematochezia or  melena.  He does have dyspnea on exertion which has been a long-term  issue for him.  However, there is no orthopnea, PND, pedal edema,  palpitations, presyncope, syncope or chest pain.   MEDICATIONS:  1. Asacol 400 mg tablets 2 p.o. t.i.d.  2. Zocor 20 mg p.o. every night.  3. Cardizem CD 240 mg p.o. daily.  4. Niaspan 500 mg p.o. daily.  5. Iron.  6. Lasix 20 mg p.o. daily.   PHYSICAL EXAMINATION:  VITAL SIGNS:  Blood pressure of 119/72.  His  pulse is 70.  He weighs 200  pounds.  HEENT:  Normal.  NECK:  Supple.  CHEST:  Clear.  CARDIOVASCULAR:  Irregular rhythm.  There is no significant murmur  noted.  ABDOMEN:  No tenderness.  EXTREMITIES:  No edema.   DIAGNOSES:  1. Status post mitral valve repair.  I do not find Mr. Nestle volume      overloaded on exam, and I am not convinced that he has had any      congestive heart failure.  However, I will continue his Lasix at      present dose, and I will check a BMET as well as a BNP.  We will      also plan to repeat his echocardiogram to reassess his LV function      and his mitral valve.  2. Atrial fibrillation.  I think given his history of mitral valve      disease, he would be at high risk for an embolic event.  I would  like him on Coumadin if possible.  He has had no recurrent GI      bleeding, and his hemoptysis resolved.  Apparently his chest x-ray      also looked better per Dr. Teddy Spike notes.  We will plan to resume      his Coumadin if his hemoglobin is normal.  We will check a CBC      today.  Once we reinitiate his Coumadin, then he will need to be      checked in the Coumadin clinic 5 days later with goal INR of 2 to      3.  3. History of GI bleed.  This was felt secondary to colitis and      diverticulosis.  He will continue on his present medications, and      GI is following this.  There has been no recurrence at present.  4. History of question of hemorrhagic bronchitis.  He is being      followed by Dr. Shelle Iron for this issue.  5. Dyspnea.  This is a chronic issue and unchanged.  6. Hyperlipidemia.  Mr. Tadros is complaining of flushing severely      waking him up at night.  We will discontinue his Niaspan, but we      will continue with his statin.  7. History of CVA felt secondary to endocarditis.  8. History of hyperlipidemia.  9. History of 40-50% right coronary artery.  Note, I am not placing      him on aspirin as he has had significant bleeding, and I am      hesitant  to add aspirin and Coumadin at the same time.   We will see him back in 3 months.     Madolyn Frieze Jens Som, MD, [facility]  Electronically Signed    BSC/MedQ  DD: 02/08/2008  DT: 02/08/2008  Job #: [id number]

## 2011-02-01 NOTE — Consult Note (Signed)
NAME:  [name], [name]                ACCOUNT NO.:  [id number]   MEDICAL RECORD NO.:  [id number]          PATIENT TYPE:  INP   LOCATION:  1419                         FACILITY:  [facility]   PHYSICIAN:  Clinton D. Maple Hudson, MD, FCCP, FACPDATE OF BIRTH:  [birth year] 06[birth year] 1938   DATE OF CONSULTATION:  11/24/2007  DATE OF DISCHARGE:                                 CONSULTATION   REASON FOR CONSULTATION:  A 75 year old man with hemoptysis.   HISTORY:  This gentleman had a very heavy smoking history which  continued until he had heart surgery with mitral valve repair.  He has  been on chronic Coumadin for atrial fibrillation.  He had increased  cough with sputum for 2-3 weeks prior to admission, including some  chilling and wheezing.  One day prior to admission he began hemoptysis.  In the emergency room, he was found to be in atrial fibrillation with  rapid ventricular response at 145 and this was addressed medically to  control rate.  Chest x-ray showed cardiomegaly with pulmonary vascular  congestion and possibly bibasilar atelectasis.  He was admitted for  stabilization.  Subsequently, he has been treated with Avelox.  A CT  angiogram on November 23, 2007 shows no dominant lesion.  There is bibasilar  airspace disease consistent with pneumonia or blood with no focal  bleeding source obvious.  Bronchodilators has been provided with Xopenex  by nebulizer.  Coumadin supposedly is being held, although it is still  on the November 24, 2007 [facility].  He tells me that the intensity of cough has  improved by 75% and the amount of blood clot he is bring up is improved  by 50%.  He is aware of some shortness of breath but it is mild.   REVIEW OF SYSTEMS:  No dizziness, syncope or chest pain.  He is not now  aware of fever, chill or additional bleeding.  His abdomen has been  tender and protuberant since his hard coughing, attributed to an  umbilical hernia.   PAST HISTORY:  1. Atrial fibrillation, status post cardioversion  x2.  2. Mitral valve replacement with history of endocarditis.  3. Prostate cancer, resected.  4. Hernia repair.  5. Cerebrovascular accident secondary to an endocarditis related      embolism.  6. Previous endocarditis.  7. Dyspnea attributed to chronic obstructive pulmonary disease.  8. Hyperlipidemia.  9. Hemorrhoids.  10.Coronary disease.  11.History of Wharton's tumor.  12.History of adrenal adenoma.  13.Degenerative joint disease.  14.Anxiety.  15.Left parotidectomy for Wharton's tumor.  16.Right inguinal hernia repair.  17.Right cataract surgery.  18.Diverticulosis   SOCIAL HISTORY:  Married Visual merchandiser.  He smoked as much as three packs a day  from age 66, quitting 5 years ago.   FAMILY HISTORY:  Prostate cancer.  Mother died of spinal cancer.  Father  died of stroke.   OBJECTIVE:  GENERAL:  He is a comfortable-appearing, ambulatory and  conversational white male.  VITAL SIGNS:  BP 136/78, pulse irregularly irregular at 67, respirations  22, temperature 97.8, room air saturation 94%.  LYMPH:  No adenopathy found at the  supraclavicular areas or axilla.  HEENT:  Speech clear.  No stridor.  No neck vein distention.  CHEST:  Crackles in the lower third of both lungs.  Breathing is  unlabored.  No dullness.  HEART:  Heart sounds are irregular without murmur or gallop.  ABDOMEN:  Protuberant.  EXTREMITIES:  No cyanosis, clubbing or edema.   LABORATORY DATA:  WBC 11,900 with hemoglobin 12.5, BUN 27, creatinine  1.21, glucose 140, prothrombin time 20.8, INR 1.7.   IMPRESSION:  1. Hemoptysis consistent with hemorrhagic bronchitis, recently on      Coumadin plus aspirin.  2. INR is now returning to normal range.  Bleeding will probably stop.      It is mild. Anticoagulation is removed.  It would be prudent in      this man with a prolonged heavy smoking history to visualize his      central airways to exclude a mucosal based neoplasm as bleeding      source.  3. Other  medical problems include chronic obstructive pulmonary      disease, probable pulmonary vascular hypertension, given his      history of mitral valve disease and chronic obstructive pulmonary      disease and his chronic atrial fibrillation.   PLAN:  I have discussed the goals, techniques and common risks of  bronchoscopy carefully with him and with his son, (a paramedic),  present.  We particularly addressed potential for cardiac arrhythmia,  bleeding and respiratory distress capable of requiring intubation.  He  agrees to proceed.  We will set this up for an available schedule on  Monday.  Because Coumadin remains on his MAR, as pointed out to the  nurse, I have written to discontinue Coumadin until his procedure can be  accomplished.      Clinton D. Maple Hudson, MD, [facility], FACP  Electronically Signed     CDY/MEDQ  D:  11/24/2007  T:  11/26/2007  Job:  [id number]   cc:   Corwin Levins, MD  [address]  [place]  [place] [address]

## 2011-02-01 NOTE — H&P (Signed)
NAMEARMAS, MCBEE NO.:  [id number]   MEDICAL RECORD NO.:  [id number]          PATIENT TYPE:  OIB   LOCATION:  2853                         FACILITY:  [facility]   PHYSICIAN:  Madolyn Frieze. Jens Som, MD, FACCDATE OF BIRTH:  1935/11/22   DATE OF ADMISSION:  02/05/2007  DATE OF DISCHARGE:                              HISTORY & PHYSICAL   This is cardioversion of atrial flutter.  The patient was sedated with  Pentothal 250 mg intravenously.  Synchronized cardioversion with 120  joules (biphasic) resulted in normal sinus rhythm.  There were no  immediate complications.  We would recommend continuing with Coumadin  with a goal INR of 2 to 3 as well as to follow up as an outpatient.      Madolyn Frieze Jens Som, MD, [facility]  Electronically Signed     BSC/MEDQ  D:  02/05/2007  T:  02/05/2007  Job:  [id number]

## 2011-02-01 NOTE — Consult Note (Signed)
[name], [name] NO.:  [id number]   MEDICAL RECORD NO.:  [id number]          PATIENT TYPE:  INP   LOCATION:  4728                         FACILITY:  [facility]   PHYSICIAN:  Iva Boop, MD,FACGDATE OF BIRTH:  09-15-1936   DATE OF CONSULTATION:  12/20/2007  DATE OF DISCHARGE:  12/20/2007                                 CONSULTATION   Mr. [name] was admitted to the hospital with bloody diarrhea.  He was  seen by Dr. Juanda Chance and then by me today.  He had diverticulosis the in  the left colon, and there was a colitis associated with that.  Biopsies  were obtained from this segmental colitis and show active chronic  mucosal colitis, consistent with ulcerative colitis in some of the  biopsies but not all of them.  Those are from the sigmoid.  It may be  that he has diverticular-associated colitis.  He had a little bit of  bleeding still, but his hemoglobin was normal.  He is going to hold his  Coumadin and follow up with me, since Dr. Juanda Chance we will be away, on  January 09, 2008.  He is on Asacol 1200 mg t.i.d. for the time being.   NOTE:  His primary gastroenterologist has been Dr. Sheryn Bison.  It  may be that he was away and unavailable as well.  I will see him back in  followup.  He may need to transfer over to Dr. Jarold Motto depending upon  the situation.      Iva Boop, MD,FACG  Electronically Signed     CEG/MEDQ  D:  12/20/2007  T:  12/21/2007  Job:  [id number]   cc:   Titus Dubin. Alwyn Ren, MD,FACP,FCCP  Vania Rea. Jarold Motto, MD, [facility]ale[facility] Essex, FAGA

## 2011-02-01 NOTE — H&P (Signed)
[name], [name] NO.:  [id number]   MEDICAL RECORD NO.:  [id number]          PATIENT TYPE:  INP   LOCATION:  0103                         FACILITY:  [facility]   PHYSICIAN:  Raenette Rover. Felicity Coyer, MDDATE OF BIRTH:  1936/04/20   DATE OF ADMISSION:  11/21/2007  DATE OF DISCHARGE:                              HISTORY & PHYSICAL   PRIMARY CARE PHYSICIAN:  Dr. Alwyn Ren of [facility] physicians.   CARDIOLOGIST:  Dr. Jens Som of [facility].   UROLOGIST:  Dr. Retta Diones I believe?   HISTORY OF PRESENT ILLNESS:  [name] is a 75 year old white  gentleman with history of atrial fibrillation with RVR, on chronic  Coumadin therapy, mitral valve repair x2 secondary to endocarditis, COPD  and hyperlipidemia, who presented to the ED with a 1-day history of  worsening shortness of breath on exertion, which was worse than his  baseline shortness of breath, a productive cough of sputum for 2-3 weeks  and 1 day prior to admission with some hemoptysis, also chills and  wheezing.  The patient denied any fever, no nausea, no vomiting, no  chest pain, no palpitations, no abdominal pain, no melena, no  hematemesis.  The patient does endorse bright red blood on the tissue  paper, no other associated symptoms.  In the ED, the patient was found  to be in atrial fibrillation with a heart rate of 145 and was placed on  Cardizem drip.  Heart rate decreased to the 70s to the 90s and blood  pressure also decreased to the 80s to the 90s.  Cardizem drip was  discontinued; heart rate remained in the 70s and blood pressure improved  to the low 100s.  The patient also received some nebulizer treatments in  the ED, Avelox and IV Solu-Medrol, and he stated that his shortness of  breath had improved.  Chest x-ray was done, which showed cardiomegaly,  pulmonary vascular congestion, status post mitral valve, questionable  mild CHF with bibasilar atelectasis.  We were called to admit the  patient  for further evaluation and management.   PAST MEDICAL HISTORY:  1. Atrial fibrillation with RVR and atrial flutter, status post      cardioversion x2.  2. Mitral valve repair; initial repair was in 2006; he also had      another repair which was secondary to endocarditis.  3. Prostate cancer, status post resection.  4. Hernia.  5. CVA, embolic, secondary to endocarditis.  6. History of endocarditis.  7. Chronic shortness of breath.  8. Hyperlipidemia.  9. History of hemorrhoids.  10.History of coronary artery disease with RCA with 40% to 50%      stenosis by cath.  11.History of Wharton's tumor, status post left parotidectomy.  12.History of adrenal adenoma.  13.COPD.  14.Degenerative joint disease.  15.Anxiety.  16.Erectile dysfunction.  17.Status post right inguinal hernia repair.  18.Right cataract surgery.  19.Diverticulosis.   HOME MEDICATIONS:  1. Metoprolol 25 mg p.o. b.i.d. neck.  2. Coumadin 7.5 mg on Sundays/Tuesdays/Thursdays and 5 mg on      Mondays/Wednesdays/Fridays/Saturdays.  3. Vytorin 10/20 daily.  4.  Aspirin 81 mg daily.   SOCIAL HISTORY:  The patient is married, lives with his wife, a semi-  retired Visual merchandiser.  He has been a 180 pack-year tobacco history, quit 5  years ago.  No alcohol.  No IV drug use.   FAMILY HISTORY:  Mother deceased at age 18 from spinal cancer.  Father  deceased at age 53 from a CVA.  He has 1 brother and 1 sister of whom he  believes are healthy.  Family history of also prostate cancer.   REVIEW OF SYSTEMS:  As per HPI, otherwise negative.   PHYSICAL EXAM:  VITAL SIGNS:  Temperature 99.5, blood pressure 147/99 to  94/38, pulse of 145, respiratory rate 24, saturating 89% on room air.  GENERAL: The patient was lying on the gurney, speaking in full  sentences.  HEENT:  Normocephalic, atraumatic.  Pupils equal, round and reactive to  light.  Extraocular movements intact.  Oropharynx clear.  No lesions, no  exudates.  RESPIRATORY:   Bibasilar coarse breath sounds and crackles, poor to fair  air movement.  CARDIOVASCULAR:  Irregularly irregular.  ABDOMEN:  Soft, nontender and nondistended.  Positive bowel sounds.  EXTREMITIES:  No clubbing, cyanosis or edema.  NEUROLOGICAL:  The patient is alert and oriented x3.  Cranial nerves II-  XII are grossly intact.  No focal deficits.  Sensation is intact.  Cerebellum is intact.  Gait not tested.   LABORATORY DATA:  BNP 91.  CBC:  White count 6.8, hemoglobin 14.9,  platelets 133,000, hematocrit 43.1, sodium 135, potassium 4, chloride  103, bicarb 22, BUN 18, creatinine 1.53, glucose of 130, calcium of 9.  Creatinine was 1.4 in February 2009.  PT 27.5, INR 2.5, PTT 49.  Point-  of-care cardiac markers:  CK-MB 1.1, troponin I less than 0.05,  myoglobin 83.7.   CHEST X-RAY:  Cardiomegaly with pulmonary vascular congestion, status  post mitral valve repair, question mild CHF with bibasilar atelectasis.   EKG:  Atrial fibrillation, incomplete right bundle branch block, right  ventricular hypertrophy.   ASSESSMENT AND PLAN:  Mr. [name] is a 75 year old gentleman with  history of atrial fibrillation and atrial flutter with rapid ventricular  response, on chronic Coumadin therapy, history of mitral valve repair  secondary to endocarditis, history of prostate cancer, status post  resection, history of cerebrovascular accident, who presents to the  emergency department with worsening shortness of breath for 1 day,  productive cough with sputum and hemoptysis, some chills and wheezing.   1. Shortness of breath, likely multifactorial, may be secondary to a      pneumonia with a chronic obstructive pulmonary disease exacerbation      versus atrial fibrillation with rapid ventricular response versus      cardiac in nature versus pulmonary embolism, which is unlikely with      low pretest probability and patient with a therapeutic INR.  We      will cycle cardiac enzymes every 8  hours x3, panculture, check his      TSH, check a magnesium, recheck a chest x-ray, check a urine      Legionella and pneumococcus antigen, start empiric antibiotics with      Avelox 400 mg daily, Mucinex 600 mg b.i.d. nebulizer treatment,      oxygen and will monitor.  2. Atrial fibrillation with rapid ventricular response may be      secondary to problem #1 or underlying pneumonia or infection,      currently rate controlled.  Will cycle cardiac enzymes every 8      hours x3, check a TSH and check a magnesium level, continue home      medications of beta blocker for rate control and Coumadin for      anticoagulation.  3. Acute renal failure.  Last baseline creatinine was 1.4 in February      of 2.9, prerenal versus renal versus post renal ejection fraction      and excretion of sodium.  Intravenous fluid, hydrate and follow      creatinine.  4. Hemoptysis, questionable etiology, may be secondary to chronic      obstructive pulmonary disease exacerbation versus infectious      etiology such as pneumonia or bronchitis versus pulmonary embolus,      unlikely with the patient having therapeutic INR with low pretest      probability versus lung gases versus neoplastic in nature versus a      vasculitis.  We will follow, recheck a chest x-ray, follow      coagulations, follow hemoglobin and hematocrit.  If continuous      hemoptysis with no improvement, we may need a chest CT for further      evaluation.  5. Mitral valve repair.  6. History of cerebrovascular accident.  7. History of endocarditis.  8. Bright red blood per rectum, likely secondary to hemorrhoids.      Follow hemoglobin and hematocrit and monitor.  9. History of coronary artery disease with some right coronary artery      stenosis with right coronary artery occlusion of 40% to 50%      stenosis.  We will continue the patient's beta blocker, Vytorin and      aspirin.  10.Hyperlipidemia. Vytorin daily.  11.Chronic  obstructive pulmonary disease.  See problem #1.  12.Degenerative joint disease.  13.Anxiety.  14.Prophylaxis:  Protonix 40 mg daily, INR is therapeutic for deep      venous thrombosis prophylaxis.   It has been a pleasure taking care of Mr. Olano.      Ramiro Harvest, MD  Electronically Signed     ______________________________  Raenette Rover Felicity Coyer, MD    DT/MEDQ  D:  11/21/2007  T:  11/21/2007  Job:  [id number]   cc:   Titus Dubin. Alwyn Ren, MD,FACP,FCCP  [phone] [place]  [place] [address]   Madolyn Frieze. Jens Som, MD, [facility]  [address]  [address]  [place]  [place] [address]

## 2011-02-01 NOTE — Discharge Summary (Signed)
NAMETSUTOMU, BARFOOT NO.:  [id number]   MEDICAL RECORD NO.:  [id number]          PATIENT TYPE:  INP   LOCATION:  1419                         FACILITY:  [facility]   PHYSICIAN:  Raenette Rover. Felicity Coyer, MDDATE OF BIRTH:  [birth year]   DATE OF ADMISSION:  11/21/2007  DATE OF DISCHARGE:  11/28/2007                               DISCHARGE SUMMARY   DISCHARGE DIAGNOSES:  1. Acute bronchitis with hemoptysis (hemorrhagic bronchitis, improved,      status post bronchoscopy November 26, 2007).  2. Atrial fibrillation with rapid ventricular response, now rate      controlled.  Anticoagulation on hold until hemoptysis resolved, to      resume Friday, November 30, 2007, as previously scheduled with      outpatient follow up [facility] on Monday, December 03, 2007 (see below).  3. Mild acute renal insufficiency due to dehydration with decreased      intake.  Discharge creatinine 1.3  4. History of prostate cancer status post resection.  Normal PSA.  5. Mild anemia secondary to above.  Discharge hemoglobin 12.3.  Acute      blood loss with hematuria and hemoptysis, resolved.  6. History of chronic obstructive pulmonary disease exacerbation due      to number one.  Continue empiric antibiotics and prednisone taper.      Symptoms at baseline.  7. History of mitral valve replacement x2 due to endocarditis.  8. History of CVA secondary to endocarditis in 2006.  9. History of coronary disease.  10.History of dyslipidemia.   DISCHARGE MEDICATIONS:  1. Diltiazem CD 240 mg once daily.  2. Coumadin 5 mg Monday, Wednesday, Friday and Saturday alternating      with 7.5 mg Tuesday, Thursday and Sunday, to resume as previously      scheduled, Friday, November 02, 2007.  3. Vytorin 10/20 once daily.  4. Aspirin 81 mg daily.  5. Avelox 400 mg once daily x3 additional days.  6. Prednisone taper 10 mg tablets 30 mg x2 additional days, then 20 mg      daily x2 days, then 10 mg daily x2  days, then 5 mg daily x2 days,      then stop.  7. Protonix 40 mg once daily.  (The patient is instructed to discontinue taking his previously  prescribed metoprolol in place of the new prescribed Diltiazem).   FOLLOW UP:  Hospital follow up is scheduled with [facility]  for Monday, March 13.  The patient to be contacted regarding time.  Will  also be contacted for follow up with his cardiologist, Dr. Jens Som,  which is to be arranged.  The patient is instructed to call his primary  care physician, Dr. Marga Melnick, in approximately 2 weeks for routine  follow up on other medical issues, as well as review of final  bronchoscopy pathology report.   CONSULTS THIS HOSPITALIZATION:  1. [facility].  2. [facility], Dr. Maple Hudson.   CONDITION ON DISCHARGE:  Medically stable and improved.   HOSPITAL COURSE BY PROBLEM:  1. Hemorrhagic  bronchitis.  The patient is a 75 year old gentleman      with a complicated past medical history of endocarditis status post      status post mitral valve replacement in 2006, as well as atrial      fibrillation with rapid ventricular response status post      cardioversion x2 on chronic anticoagulation who presents to the      emergency room the day of admission due to coughing and subsequent      hemoptysis.  INR was therapeutic at 2.5 on admission, white count      mildly elevated at 11.9, and chest x-ray with question of CHF and      bibasilar atelectasis.  No definitive pneumonia.  He was admitted      due to these symptoms, begun on empiric IV antibiotics, cultured,      as well as nebulizers and antitussives and oxygen as needed.  Due      to persistent hemoptysis, even with the reversal and correction of      his anticoagulation, it prompted a pulmonary evaluation.  After CT      chest angio was negative for PE or other clear air space disease,      bronchoscopy was performed on November 26, 2007 to rule out       endobronchial, and none could be found.  Preliminary pathology from      the washings showed mild atypia but not suggestive and consistent      with carcinoma.  Final pathology report to be followed up by      primary M.D.  At this time, his hemoptysis has been resolved for      greater than 48 hours.  He will continue on empiric antibiotic      therapy for a total of 10 days for treatment of his associated      bronchitis and prednisone taper as described below.  2. Atrial fibrillation with rapid ventricular response.  During this      hospitalization and prior, the patient again had uncontrolled rapid      rates.  He was seen in consultation by cardiology due to his      complicated history.  Anticoagulation was held in the setting of      hemoptysis as described above.  The patient was eventually changed      from his previous beta blocker to Diltiazem, which has kept him      under good control, and will continue the same at discharge.  It is      felt by pulmonology, as well as cardiology __________  to resume      his anticoagulation at this time once his hemoptysis has resolved,      and this date has been determined to be November 30, 2007 pending any      other further changes.  He will follow up at the [facility] on      Monday, December 03, 2007, for INR as previously arranged.  3. Other medical issues.  The patient's other medical issues are      chronic and numerous but as listed and without change during his      hospitalization.  Please see above for other problems.      Valerie A. Felicity Coyer, MD  Electronically Signed     VAL/MEDQ  D:  11/28/2007  T:  11/29/2007  Job:  [id number]

## 2011-02-01 NOTE — Assessment & Plan Note (Signed)
[facility]                            CARDIOLOGY OFFICE NOTE   DOMONIC, KIMBALL                       MRN:          [id number]  DATE:05/03/2007                            DOB:          14-Oct-1935    Mr. Fayson is a pleasant gentleman who has a history of mitral valve  repair, as well as atrial fibrillation and flutter.  His initial repair  was in 2006 and he had embolic CVA after the procedure, and was  ultimately found to have endocarditis.  He subsequently underwent redo  mitral valve repair.  Since I last saw him, he has had some dyspnea on  exertion, but there is no orthopnea, PND, pedal edema, palpitations, pre-  syncope, syncope, or chest pain.  Note, his most recent echocardiogram  on Feb 16, 2007 showed normal LV function.  There was mild aortic  insufficiency.  The mean gradient across his mitral valve was 7 mmHg and  was consistent with very mild mitral stenosis.  There was mild mitral  regurgitation.   MEDICATIONS:  1. Aspirin 81 mg p.o. daily.  2. Vytorin 10/20 daily.  3. Lopressor 25 mg p.o. b.i.d.  4. Coumadin as directed.   PHYSICAL EXAM:  Blood pressure 104/74, pulse 68.  He weighs 202 pounds.  HEENT:  Normal.  NECK:  Supple.  CHEST:  Clear.  CARDIOVASCULAR:  Regular rate and rhythm.  ABDOMEN:  Benign.  EXTREMITIES:  No edema.   ELECTROCARDIOGRAM:  Shows a sinus rhythm at a rate of 72.  There is a  first degree AV block.  There are occasional PVCs.  There is a right  bundle branch block.  There is a left anterior fascicular block.   DIAGNOSES:  1. Status post mitral valve repair - he will continue with subacute      bacterial endocarditis prophylaxis and his recent echocardiogram      showed normal left ventricular function, very mild mitral stenosis,      and mild mitral regurgitation.  2. History of atrial flutter - note the patient had developed      recurrent atrial flutter.  However, we cardioverted the patient    back to sinus rhythm on Feb 05, 2007 and he remains on sinus      rhythm.  He will continue on his Lopressor and Coumadin with a goal      INR of 2 to 3.  We can consider atrial flutter ablation in the      future if his rhythm recurs.  However, he would not be able to come      off his Coumadin, as he has had atrial fibrillation in the past.  3. Recent hematochezia secondary to hemorrhoids - this has resolved.  4. History of embolic cerebrovascular accidents - this was felt      secondary to endocarditis.  However, he will continue on his      Coumadin.  5. Coumadin therapy - followed in [facility].  6. History of hyperlipidemia - he will continue on his Statin.  7. History of coronary artery disease in the  right coronary artery (40-      50%).  8. Dyspnea - we will check a BNP.   I will see him back in 6 months.     Madolyn Frieze Jens Som, MD, [facility]  Electronically Signed    BSC/MedQ  DD: 05/03/2007  DT: 05/04/2007  Job #: [id number]

## 2011-02-01 NOTE — Assessment & Plan Note (Signed)
[facility]                            CARDIOLOGY OFFICE NOTE   [name], [name]                       MRN:          [id number]  DATE:11/01/2007                            DOB:          1936-02-28    [name] is a very pleasant 75 year old gentleman has a history of  mitral valve repair, as well as atrial fibrillation and flutter.  Note,  his initial repair was in 2069 embolic CVA after the procedure and was  subsequently found to have endocarditis requiring redo mitral valve  repair.  His most recent echocardiogram was performed on Feb 16, 2007.  His LV function was normal.  There was mild aortic insufficiency.  There  was mild mitral stenosis and mild mitral regurgitation.  The left atrium  is mildly dilated.  Since I last saw him, he does have some dyspnea on  exertion which has been a chronic issue and is unchanged.  There is no  orthopnea, PND or pedal edema.  He has had no chest pain, palpitations  or syncope.  His medications include aspirin 81 mg daily, Vytorin 10/20  daily, metoprolol 25 mg p.o. b.i.d. and Coumadin as directed.   PHYSICAL EXAM:  Today shows a blood pressure of 130/72.  His pulse is  71.  Weight is 213 pounds.  HEENT:  Normal.  Neck is supple.  CHEST:  Clear.  CARDIOVASCULAR:  Irregular rhythm.  The rate is normal.  There are no  murmurs noted.  ABDOMEN:  Exam shows no tenderness.  EXTREMITIES:  Show no edema.   His electrocardiogram shows probable course atrial fibrillation with  frequent PVCs or aberrantly conducted beats.  There are no significant  ST changes noted.  There is a prior inferior infarct.   DIAGNOSIS:  1. Status post mitral valve repair - we will continue the SBE      prophylaxis.  2. Recurrent atrial fibrillation/flutter - [name] has developed      recurrent atrial arrhythmias.  We discussed the options today.      Note, his symptoms are the same now as they were when he was in      sinus  rhythm.  I therefore think we could continue with rate      control and anticoagulation and he is in agreement with this.  We      will continue with his Lopressor for rate control as well as      Coumadin with goal INR 2 to 3.  This will be lifelong.  Note, we      could consider atrial flutter ablation in the future, but he has      had atrial fibrillation in the past.  Therefore a cannot come off      his Coumadin.  3. Dyspnea - we will check a BMP today.  Note, he has had 60 pack-year      history of smoking and there may be a component of chronic      obstructive pulmonary disease as well as deconditioning.  If his      BNP is elevated  we could add low-dose diuretic.  4. History of medications secondary to hemorrhoids.  5. History of embolic cerebrovascular accident - this was felt      secondary to endocarditis.  However, given his atrial arrhythmias      he will need to continue on Coumadin for life.  6. Coumadin therapy - this is being monitored in our [facility]      with goal INR will be 2 to 3.  We will check a CBC.  7. History of hyperlipidemia - he will continue on his statin and      check lipids liver adjust as indicated.  Also check a BMET.  8. History of 40-50% right coronary - continue on his aspirin, statin.   He will see [place] back in 6 months.     Madolyn Frieze Jens Som, MD, [facility]  Electronically Signed    BSC/MedQ  DD: 2020/02/1108  DT: 11/02/2007  Job #: [id number]

## 2011-02-01 NOTE — Assessment & Plan Note (Signed)
[facility]                            CARDIOLOGY OFFICE NOTE   [name], [name]                       MRN:          [id number]  DATE:01/17/2008                            DOB:          1936-05-22    Mr. Mathew returns for followup today.   He has a history of mitral valve repair as well as atrial  fibrillation/flutter.  He had a previous embolic CVA after his initial  mitral valve repair and this was felt secondary to endocarditis and he  had a redo.  When I last saw him on December 17, 2007, it was noted that he  had a recent admission for hemorrhagic bronchitis.  Also at that time he  was complaining of hematochezia and ecchymosis over his abdomen.  He was  admitted with a GI bleed and his Coumadin was discontinued.  A  colonoscopy showed colitis and diverticulosis.  His Coumadin was held.  Since then he denies any chest pain, palpitations, or syncope.  He does  have some dyspnea on exertion which has been a chronic issue and  unchanged.  There is no pedal edema.  He has had some minimal  hematochezia but he was seen by Dr. Leone Payor earlier this week and  apparently was told that he could resume his Coumadin.   MEDICATIONS:  1. Asacol 400 mg tablets, two p.o. t.i.d.  2. Zocor 20 mg p.o. daily.  3. Cardizem 240 mg p.o. daily.  4. Niaspan 500 mg p.o. daily.   PHYSICAL EXAMINATION:  VITAL SIGNS:  Show a blood pressure of 122/74 and  his pulse is 75.  HEENT:  Normal.  NECK:  Supple.  CHEST:  Clear  CARDIOVASCULAR:  Reveals an irregular rhythm.  ABDOMEN:  Shows no tenderness.  EXTREMITIES:  Show no edema.   DIAGNOSES:  1. A recent gastrointestinal bleed.  This was felt secondary to      colitis and diverticulosis.  Dr. Leone Payor has given him the okay to      resume his Coumadin.  We will reinitiate and he will be followed in      the Coumadin clinic.  I have instructed him to follow his bowel      movements closely for any evidence of  hematochezia or melena.  2. History of mitral valve repair.  This appears to be stable.  He      will continue with subacute bacterial endocarditis prophylaxis.  We      will most likely repeat his echocardiogram in 6 months when I see      him back.  3. Dyspnea.  This is a chronic issue and is unchanged.  4. History of cerebrovascular accident felt secondary to endocarditis.  5. Coumadin therapy.  6. History of hyperlipidemia.  He will continue on his Statin.  7. History of 40-50% right coronary artery.   He will see [place] back in 6 months.     Madolyn Frieze Jens Som, MD, [facility]  Electronically Signed    BSC/MedQ  DD: 01/17/2008  DT: 01/17/2008  Job #: [id number]   cc:   Titus Dubin.  Alwyn Ren, MD,FACP,FCCP

## 2011-02-01 NOTE — Discharge Summary (Signed)
[name], [name]                ACCOUNT NO.:  [id number]   MEDICAL RECORD NO.:  [id number]          PATIENT TYPE:  INP   LOCATION:  4728                         FACILITY:  [facility]   PHYSICIAN:  Valetta Mole. Swords, MD    DATE OF BIRTH:  Apr 19, 1936   DATE OF ADMISSION:  12/17/2007  DATE OF DISCHARGE:  12/20/2007                               DISCHARGE SUMMARY   DISCHARGE DIAGNOSES:  1. Lower gastrointestinal bleed/colitis, most likely ischemic, status      post colonoscopy, December 19, 2007.  2. Atrial fibrillation.  3. History of prostate cancer.  4. History of anemia.  5. Dyslipidemia.  6. Chronic obstructive pulmonary disease.  7. History of mitral valve repair.   HISTORY OF PRESENT ILLNESS:  [name] is a 75 year old white male who  was admitted previously to our service on November 21, 2007, through November 28, 2007, for hemorrhagic bronchitis.  He presented on December 17, 2007,  from [facility] secondary to complaints of bright red blood per  rectum, which apparently started on Saturday, December 15, 2007.  He also  noted ecchymosis on his abdomen and an INR of 4.9 was noted in the  cardiology office on December 17, 2007.  He noted 3-4 dark stools with  bright red blood per rectum on the day of admission.  He was admitted  for further evaluation and treatment.   PAST MEDICAL HISTORY:  1. Acute bronchitis with hemoptysis (hemorrhagic bronchitis).  2. Atrial fibrillation.  3. Prostate cancer.  4. Anemia.  5. COPD.  6. Mitral valve repair x2 secondary to endocarditis.  7. CVA secondary to diverticulitis.  8. Endocarditis.  9. Coronary artery disease.  10.Dyslipidemia.  11.History of adrenal adenoma.  12.Warthin's tumor.  13.Diverticulosis.   COURSE OF HOSPITALIZATION:  1. Lower GI bleed/colitis.  The patient was admitted and was reversed      with vitamin K.  He was also given FFP.  The CT of the abdomen and      pelvis with oral contrast was performed as there was some  concern      that he might have had some intraperitoneal bleeding with the      ecchymosis noted on his abdomen.  CT was negative for any acute      findings.  It did note diverticulosis and a left inguinal hernia.      He was noted to have a stable hemoglobin on admission.  He remained      hemodynamically stable throughout this hospitalization.  He did not      require blood transfusion.  He underwent a colonoscopy performed on      December 19, 2007, which noted colitis.  Biopsies were taken, which are      pending at the time of this dictation.  Coumadin and aspirin were      held.  It was felt that the findings were most likely consistent      with ischemic colitis, and the patient was started on Asacol.  2. Atrial fibrillation.  The patient was evaluated during this  admission by [facility], Dr. Olga Millers.   PLAN:  At this time is to remain off aspirin indefinitely per Dr.  Jens Som and to resume Coumadin when okay with GI.   MEDICATIONS:  At the time of discharge:  1. Vytorin 10/20 one tab p.o. at bedtime.  2. Symbicort 160/4.5 mcg 2 puffs twice daily.  3. Cardizem CD 360 mg p.o. daily.  4. Asacol 400 mg tabs 3 tabs p.o. t.i.d.  5. Claritin 10 mg p.o. daily.   PERTINENT LABORATORY DATA:  At the time of discharge hemoglobin 12.6 and  hematocrit 36.8.   DISPOSITION:  The patient will be discharged to home.   FOLLOWUP:  He is scheduled for follow up with Dr. Marga Melnick on  Wednesday, December 25, 2007, at 2 p.m.  He will need a followup CBC drawn  at that time.  He is also scheduled to follow up with Dr. Stan Head  on January 09, 2008, at 8:45 a.m.  We will continue to hold Coumadin until  that time and defer resuming that Coumadin to Dr. Leone Payor.  He is also  instructed to follow up with Dr. Olga Millers in 1 month.      Sandford Craze, NP      Valetta Mole. Swords, MD  Electronically Signed    MO/MEDQ  D:  12/20/2007  T:  12/21/2007  Job:   [id number]   cc:   Madolyn Frieze. Jens Som, MD, [facility]  Iva Boop, MD,FACG  Titus Dubin. Alwyn Ren, MD,FACP,FCCP

## 2011-02-01 NOTE — Op Note (Signed)
NAME:  [name], [name]                ACCOUNT NO.:  [id number]   MEDICAL RECORD NO.:  [id number]          PATIENT TYPE:  INP   LOCATION:  1419                         FACILITY:  [facility]   PHYSICIAN:  Clinton D. Maple Hudson, MD, FCCP, FACPDATE OF BIRTH:  [birth year]38   DATE OF PROCEDURE:  11/26/2007  DATE OF DISCHARGE:                               OPERATIVE REPORT   INDICATIONS FOR PROCEDURE:  A 75 year old former smoker has developed  hemoptysis while anticoagulated for cardiac rhythm.  Bleeding has been  slow to stop, now off of anticoagulants, and bronchoscopy is performed  to identify bleeding source.  Radiology does not suggest a focal lesion.  See recent hospital admission history and physical.   DESCRIPTION OF PROCEDURE:  After fully informed consent, bronchoscopy  was performed on an inpatient basis in the endoscopy suite.  The upper  airway was anesthetized topically with Xylocaine, and a cumulative dose  of 75 mcg of intravenous fentanyl was given for additional sedation and  cough control.  Versed was not used because of a past history of  agitated confusion with this drug.  Oxygen was provided at 6 liters per  minute, holding saturation at 96%.  Cardiac monitor showed atrial  fibrillation with ventricular response 110-134, BP 150/80.  A video  fiberscope was introduced via the right nostril to the level of the  vocal cords without difficulty.  Cough reflex was moderate.  His cords  moved normally.  The trachea and main carina were unremarkable.  Sequential examination of each lobar and segmental airway bilaterally to  the fourth division level revealed minimal bronchitis with clear  secretions.  No active blood or bleeding source was seen, and no  suspicious areas were seen.  A small amount of rusty, blood-stained  mucus appeared to be coming from the dependent right lower lobe,  probably the medial basal segment.  To avoid disrupting early clot,  instrumentation was not performed  into this area.  Simple saline  bronchial lavage was performed, and samples were sent to the laboratory  for cytology and for cultures.  The bronchoscope was then removed.  There were no apparent complications, and he tolerated the procedure  quite well.  He was pleasant and conversant following the procedure.   FINAL IMPRESSION:  Hemorrhagic bronchitis, probably benign.   RECOMMENDATION:  If possible, he should stay off of anticoagulants until  bleeding has stopped, probably another three days or so.  At that time,  anticoagulation for embolism prophylaxis can be restarted.  I will be  happy to see him again p.r.n.      Clinton D. Maple Hudson, MD, [facility], FACP  Electronically Signed     CDY/MEDQ  D:  11/26/2007  T:  11/27/2007  Job:  [id number]   cc:   Vikki Ports A. Felicity Coyer, MD  [address] [place], [place] [address]   Titus Dubin. Alwyn Ren, MD,FACP,FCCP  [phone] [address] [place]  [place] [address]

## 2011-02-04 NOTE — Cardiovascular Report (Signed)
NAMEDORN, HARTSHORNE NO.:  [id number]   MEDICAL RECORD NO.:  [id number]          PATIENT TYPE:  INP   LOCATION:  3741                         FACILITY:  [facility]   PHYSICIAN:  Charlton Haws, M.D.     DATE OF BIRTH:  28-Jul-1936   DATE OF PROCEDURE:  09/13/2005  DATE OF DISCHARGE:                              CARDIAC CATHETERIZATION   Coronary arteriography.   INDICATIONS:  Previous maze and mitral valve ring with endocarditis or  thrombus. Preoperative clearance of the coronary arteries.   Cine catheterization was done with 6-French sheath from the right femoral  artery. Left main coronary was normal.   Left anterior descending artery was normal.   First and second diagonal branches were normal.   Circumflex coronary artery is nondominant and normal.   Right coronary artery was somewhat large and patulous. There is a 40-50%  discrete stenosis in the midvessel. Distal vessel was normal. There was one  large PLA and one PDA.   IMPRESSION:  Need for redo open heart surgery for thrombus or endocarditis  of the mitral valve ring. Probable need for a single saphenous vein graft to  the distal right coronary artery. The patient tolerated the procedure well.  We AngioSealed his right groin so that we could start heparin immediately  given his history of embolic CVA and vegetation or thrombus in the mitral  valve. Dr. Cornelius Moras is aware of the patient and will probably reoperate  tomorrow.           ______________________________  Charlton Haws, M.D.     PN/MEDQ  D:  09/13/2005  T:  09/13/2005  Job:  [id number]

## 2011-02-04 NOTE — Discharge Summary (Signed)
[name], POLICE NO.:  [id number]   MEDICAL RECORD NO.:  [id number]          PATIENT TYPE:  INP   LOCATION:  2019                         FACILITY:  [facility]   PHYSICIAN:  Salvatore Decent. Cornelius Moras, M.D. 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   ADDENDUM:  Initially it had been anticipated that [name] would be ready  for discharge home on September 21, 2005.  Arrangements had been made for him  to receive home health nursing for IV vancomycin and IV voriconazole which  had been prescribed for bacterial endocarditis, with initial cultures  showing coagulase-negative Staphylococcus.  Voriconazole had been ordered  for a perceived fungal element seen on slides by the pathologist.  However,  after further investigation by Dr. Lenn Sink, it was determined that  once the stains were reviewed, fungal elements were seen on the controlled  slides as well.  This insinuated that the fungus had contaminated the stain,  causing a false positive interpretation.  Subsequently, the voriconazole was  discontinued.  In addition, it was determined that coagulase-negative  Staphylococcus were present on his previous prosthetic ring and suture  material, making the more accurate diagnosis of prosthetic valve  endocarditis rather than native valve endocarditis.  This revelation  resulted in a change in his antibiotic regimen to tailor treatment for  prosthetic valve endocarditis which included gentamycin IV x2 weeks,  vancomycin IV x6 weeks, and rifampin IV or p.o. x6 weeks.  Since this is a  rather toxic regimen, Dr. Roxan Hockey felt that [name] should remain  hospitalized for up to two weeks longer to monitor his renal function as  well as his INR levels to help avoid dramatic  fluctuations.  Therefore, [name] remained  hospitalized for an additional eight days.  During the  timeframe, there was essentially no change in his status.  He remained  stable.  He underwent frequent lab work which showed no change in his  creatinine which stayed between 1.4 and 1.6.  He did initially have a slight  jump in his INR, but with adjustment of his Coumadin dose, this subsided,  and in fact, was slightly subtherapeutic at discharge.   He remained in atrial fibrillation with a controlled rate, although did  require a twice-a-day regimen of Toprol-XL for a heart rate in the low 100s.   During his remaining week of hospitalization, he also underwent a nutrition  consult to instruct him on carbohydrate-modified diet choices, as  postoperative sugars still remained elevated at 140-160s, with one sugar as  high as 250.  Subsequent sugars did show improvement, ranging between 95 and  105.   His incisions continued to heal well, and his right arm PICC line site also  showed no evidence of infection.   On September 29, 2005, [name] underwent peak and trough levels of his  gentamycin and vancomycin.  Gentamycin levels were low, while his vancomycin  trough was elevated.  Both required dosing changes which were done by  pharmacy.  With his new medication regimen and since his other labs had  remained stable, it was felt that [name] was stable for discharge home  at this point.   DISCHARGE LABORATORY DATA:  Sodium 136, potassium 3.8, chloride 103, CO2 27,  BUN 17, creatinine 1.6, blood glucose 97.  INR 1.4.  PT 17.1.  Hemoglobin  9.2, hematocrit 27.6, white blood cell count 7.5, platelet count 355.   DISCHARGE MEDICATIONS:  1.  Aspirin 81 mg p.o. daily.  2.  Toprol-XL 25 mg p.o. b.i.d.  3.  Vytorin 10/20 mg p.o. daily.  4.  Protonix 40 mg p.o. daily.  5.  Niferex 150 mg p.o. daily.  6.  Lasix 40 mg p.o. daily.  7.  K-Dur 20 mEq p.o. daily.  8.  Clonazepam 0.5 mg  p.o. q.h.s. p.r.n.  9.  Coumadin 5 mg p.o. daily, except 7.5 mg on Mondays and Fridays.  10. Rifampin 300 mg p.o. t.i.d. x5 more weeks (last dose scheduled for      November 02, 2005 unless further instructed by Dr. Roxan Hockey).  11. Vancomycin 1000 mg IV daily x5 more weeks (last dose scheduled for      November 02, 2005 unless further instructed by Dr. Roxan Hockey).  12. Gentamycin 60 mg IV q.12h. x1 more week (last dose scheduled October 05, 2005).  13. Ultram 50 mg one to two tablets p.o. q.4-6h. p.r.n. pain.   DISCHARGE INSTRUCTIONS:  1.  He is instructed to avoid driving or heavy lifting more than 10 pounds.      He was encouraged to continue daily walking and breathing exercises.  2.  He is to follow a low fat, low salt diet with carbohydrate-modified      restrictions as discussed by the dietician.  3.  While his PICC line is in place, he should sponge bathe and avoid      getting this site wet or moist.  4.  He should notify the CVTS office if he develops fever greater than 101      or redness or drainage from his incision site or PICC line site or for      increasing shortness of breath or new edema.  5.  Home health nursing has been arranged through [facility].   FOLLOW UP:  1.  He is to follow up with Dr. Jens Som at [facility] on October 05, 2005 at 11:15 a.m. with a chest x-ray.  2.  He is to follow up with Dr. Tressie Stalker at the CVTS office on October 31, 2005 at 2:15 p.m.  3.  He is to follow up with Dr. Osborn Coho at [facility] on October 15, 2005 at 1 p.m. 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  4.  He is to follow up with Dr. Lenn Sink at [facility] on October 17, 2005 at 10:15 a.m.  5.  He is to have PT/INR lab draw by the home health nurse on September 30, 2005, with results to be faxed to [facility].  His Coumadin flow     sheet maintained during has  hospitalization has been faxed to Dr.      Jens Som prior to discharge.  Additional INR lab draws to  be outlined by      Dr. Jens Som.  6.  He is to have weekly vancomycin troughs.  The next one due on October 06, 2005.  Results are to be faxed to Dr. Lenn Sink.  7.  He is to have weekly basic metabolic panels while on his antibiotics.      The next one is due on October 03, 2005.  Afterwards, they may be drawn      weekly with his vancomycin trough.  These results are also to be faxed      to Dr. Roxan Hockey.   For complete information on his HPI, past medical history, and hospital  course prior to September 21, 2005, please see previously dictated discharge  summary.      Jerold Coombe, P.A.      Salvatore Decent. Cornelius Moras, M.D.  Electronically Signed    AWZ/MEDQ  D:  09/29/2005  T:  09/30/2005  Job:  [id number]   cc:   Salvatore Decent. Cornelius Moras, M.D.  [address]  [place]  [place] [address]   Olga Millers, M.D. [facility]  [address]  [address]  [place]  [place] [address]   Rockey Situ. Flavia Shipper., M.D.  Fax: [phone]   Genene Churn. Love, M.D.  Fax: [phone]   Kinnie Scales. Annalee Genta, M.D.  Fax: [phone]   Titus Dubin. Alwyn Ren, M.D. [facility]  [phone] [place]  [place] [address]   Rosalyn Gess. Norins, M.D. [facility]  [address]  [place]  [place] [address]   Thora Lance, M.D.  Fax: [phone]

## 2011-02-04 NOTE — Consult Note (Signed)
NAME:  BUEFORD, ARP NO.:  [id number]   MEDICAL RECORD NO.:  [id number]          PATIENT TYPE:  INP   LOCATION:  3739                         FACILITY:  [facility]   PHYSICIAN:  Olga Millers, M.D. [facility] OF BIRTH:  1935-11-05   DATE OF CONSULTATION:  09/15/2004  DATE OF DISCHARGE:                                   CONSULTATION   Mr. Tobler is a pleasant 75 year old male with past medical history of  atrial fibrillation/flutter, prostate cancer, recent hypertension,  hyperlipidemia, whom we were asked to evaluate for atrial fibrillation.  The  patient has previously been seen by Dr. Samule Ohm and Dr. Graciela Husbands.  He had a  previous echocardiogram that showed normal LV function and mild mitral and  tricuspid regurgitation.  He also has had a previous nuclear study that  showed inferior thinning, but there was no ischemia.  He had been seen by  Dr. Graciela Husbands previously for consideration of atrial flutter ablation.  In that  note there is mention of brief atrial fibrillation in the past.  The  procedure was not performed, and the patient had done well.  He typically  has minimal dyspnea with exertion, but there is no orthopnea, PND, pedal  edema, palpitations, presyncope, syncope, or exertional chest pain.  Over  the past two to three days, he notes marked worsening of dyspnea on  exertion.  There is also occasional orthopnea.  There has been no PND, pedal  edema, chest pain, or palpitations.  He was seen by Dr. Alwyn Ren, and he was  noted to be in atrial fibrillation and he was admitted.  It is also of note  that the patient states his diastolic blood pressure has been in the 90-120  range over the past several days.  Because of his atrial fibrillation, we  were asked to further evaluate.   His medications at home include Vytorin.   He has no known drug allergies.   SOCIAL HISTORY:  He has not smoked in the past two years.  However, he has  smoked up to three packs per day for  60 years.  He does occasionally consume  alcohol.   FAMILY HISTORY:  Significant for coronary artery disease in his father.   PAST MEDICAL HISTORY:  There is no diabetes mellitus.  He does describe  recent hypertension, and there is a history of hyperlipidemia that he has  treated with Vytorin.  He has a history of atrial arrhythmias as described  in the HPI.  He has a history of prostate cancer and is status post  resection.  He has also had a parotid tumor resected.  He has had a previous  hernia repair as well.  There is no other significant past medical history  noted.   REVIEW OF SYSTEMS:  He denies any headaches or fevers or chills.  There is  no productive cough or hemoptysis.  There is no dysphagia, no odynophagia,  melena, or hematochezia.  There is no dysuria or hematuria.  There is no  __________.  There is no orthopnea, PND, or pedal edema.  The remaining  systems are negative.   PHYSICAL EXAMINATION:  VITAL SIGNS:  His blood pressure is 124/83 and his  pulse is 89.  He is afebrile.  GENERAL:  He is well-developed and well-nourished, in no acute distress.  He  does not appear to be depressed, and there is no peripheral clubbing.  SKIN:  Warm and dry.  HEENT:  Unremarkable with normal eyelids.  NECK:  Supple with a normal upstroke bilaterally, and I cannot appreciate  bruits.  There is no jugular venous distention and no thyromegaly.  CHEST:  Clear to auscultation, normal expansion.  CARDIOVASCULAR:  An irregular rhythm.  There is a 2-3/6 systolic murmur at  the apex that radiates to the left axilla.  ABDOMEN:  Nontender, nondistended, with positive bowel sounds, no  hepatosplenomegaly, and no mass appreciated.  There is no abdominal bruit.  He has 2+ femoral pulses bilaterally and no bruits.  EXTREMITIES:  No edema, and I can palpate no cords.  He has 2+ dorsalis  pedis pulses bilaterally.  NEUROLOGIC:  Grossly intact.   His electrocardiogram shows coarse atrial  fibrillation with a ventricular  response at 90.  There appears to be a prior inferior infarct.  There are no  ST changes noted.  His hemoglobin and hematocrit are 14.9 and 42.5.  His  platelet count is 142.  His BUN and creatinine are 17 and 0.9.  His TSH is  normal.  His cardiac enzymes are negative x3.   DIAGNOSES:  1.  Paroxysmal atrial fibrillation.  2.  History of atrial flutter.  3.  Recent hypertension.  4.  Hyperlipidemia.  5.  Remote history of tobacco use.  6.  History of prostate cancer.  7.  Mitral regurgitation and murmur on examination.   PLAN:  Mr. Boldman has developed recurrent atrial fibrillation that is most  likely responsible for his dyspnea on exertion as well as orthopnea.  We  will plan to check an echocardiogram to quantify his left ventricular  function.  He also has a loud mitral regurgitation murmur on exam, and we  will have to better assess this.  His heart rate has been in the 80-100  range, but it appears to increase with exertion.  We will add low-dose  Cardizem at 30 mg p.o. q.6h.  If his rate is controlled with this, then we  will change to Cardizem CD tomorrow morning.  He also has embolic risk  factors of age greater than 71 and hypertension.  We will add Coumadin and  cardiovert in four weeks after his INR is therapeutic.  It should be noted  that if his symptoms persist despite controlling his heart rate, then he  will require TEE-guided cardioversion.  I agree with gentle diuresis.  It  should also be noted that his rhythm today appears to be atrial  fibrillation.  He has had atrial flutter in the past, and consideration has  been given to proceeding with ablation.  If his rhythm is predominantly  atrial flutter in the future, then we could consider ablation at that point.  We will also check a BNP.  His TSH is normal.       BC/MEDQ  D:  09/15/2004  T:  09/15/2004  Job:  [id number]

## 2011-02-04 NOTE — Discharge Summary (Signed)
[name], [name] NO.:  [id number]   MEDICAL RECORD NO.:  [id number]          PATIENT TYPE:  INP   LOCATION:  2019                         FACILITY:  [facility]   PHYSICIAN:  Salvatore Decent. Cornelius Moras, M.D. DATE OF BIRTH:  08-16-1936   DATE OF ADMISSION:  09/09/2005  DATE OF DISCHARGE:  09/21/2005                                 DISCHARGE SUMMARY   HISTORY OF PRESENT ILLNESS:  The patient is a 75 year old gentleman well-  known to [facility] and [facility]  [facility] who originally presented with symptoms of mitral regurgitation  and atrial fibrillation and underwent a mitral valve repair with a Maze  procedure in January of 2006.  His initial postoperative recovery was  uncomplicated.  In April 2006 the patient first developed some visual  disturbances and an MRI was performed and demonstrated small areas of likely  infarction.  These were presumably from an embolic source.  In May the  patient presented with severe back pain and ultimately was diagnosed with  infectious diskitis of the lumbar spine.  He developed respiratory distress  and respiratory failure and was hospitalized for a prolonged period of time.  During that time he underwent an echocardiogram as well as transesophageal  echocardiogram which revealed the presence of an intact mitral valve with  mobile density noted on the mitral valve suspicious for possible thrombus  versus vegetation.  Patient had no other findings suspicious for bacterial  endocarditis and with the exception of a single blood culture obtained that  grew out coagulase-negative Staphylococcus.  He has been treated since that  time with continuous antibiotics as well as anticoagulation including  Coumadin and aspirin.  The patient improved from a clinical standpoint and  returned to essentially normal activity other than intermittent mild  exertional shortness of breath.  However, over the last month  prior to  admission the patient developed at least two or three transient neurological  events and follow-up MRI demonstrated findings consistent with recurrent  embolic strokes.  The patient has been followed by both Dr. Sandria Manly and Dr.  Jens Som and he was readmitted this hospitalization for further evaluation  and management.   PAST MEDICAL HISTORY:  1.  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  2.  Persistent atrial fibrillation status post Maze procedure.  3.  Postoperative atrial flutter.  4.  Cardioembolic stroke (multiple and recurrent).  5.  Hyperlipidemia.  6.  Remote tobacco use.  7.  Congestive heart failure, remote.  8.  Hypertension.  9.  Prostate cancer.  10. Anxiety.  11. Moderate right coronary artery stenosis.  12. Erectile dysfunction.  13. History of benign adrenal adenoma.  14. Chronic obstructive pulmonary disease.  15. Pneumonia complicated by respiratory failure in June 2006.  16. L4-5 infectious diskitis treated medically, presumed resolved.   SOCIAL HISTORY:  See admission history and physical.   FAMILY HISTORY:  See admission history and physical.   MEDICATIONS PRIOR TO ADMISSION:  1.  Coumadin.  2.  Keflex 500 mg b.i.d.  3.  Cartia XT 120 mg daily.  4.  Digoxin 0.125 mg daily.  5.  Potassium chloride 10 mEq daily.  6.  Vytorin 10/20 one tablet daily.  7.  Clonazepam 0.5 mg q.h.s.  8.  Aspirin 81 mg daily.   ALLERGIES:  No known drug allergies.   REVIEW OF SYSTEMS:  Please see history and physical done at the time of  admission.   PHYSICAL EXAMINATION:  Please see history and physical done at the time of  admission.   HOSPITAL COURSE:  Patient was admitted for further evaluation and treatment  as described.  This would include cardiac catheterization.  The patient  showed a 40-50% discrete stenosis in the mid right coronary artery.  Otherwise, the  vessel was normal.  Other vessels were also within normal  limits.  Surgical opinion was re-obtained with Tressie Stalker, M.D. who  evaluated the patient and studies and agreed with recommendations to proceed  with surgery.  The mobile mass adherent to the atrial surface of the mitral  valve was presumably representative vegetation and/or clot despite more than  six months of continuous antibiotics and continuous anticoagulation with  multiple recurrent embolic stroke.  He felt as though this represented  smoldering subacute bacterial endocarditis in the setting of a patient with  previous mitral valve repair and medical therapy has failed.  The  predominant feeling was that the patient would best be served by redo mitral  valve repair and/or possible mitral valve replacement.   PROCEDURE:  On September 14, 2005 the patient was taken to the operating room  where he underwent the following procedure:  Redo median sternotomy for redo  mitral valve repair (debridement of mitral valve with removal of vegetations  and 26 mm Edwards ring annuloplasty).  This procedure was performed by Dr.  Tressie Stalker and tolerated well and he was taken to the surgical intensive  care unit in stable condition.   POSTOPERATIVE HOSPITAL COURSE:  Patient has done clinically quite well.  The  pathology specimen revealed hyphae and the patient has been started on a  course of antifungal agents.  Initially, _____ and subsequently he was  changed to voriconazole.  Additionally, he has been treated with intravenous  vancomycin and Rocephin and is felt to require prolonged ongoing intravenous  therapy at home to be dictated through the infectious disease specialists as  to duration.  These arrangements have been made.  Of note, the patient also  is felt to have a possible complication related to previous parotid surgery. 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 Osborn Coho, ENT consultation.  This will be done as an outpatient.  Currently, the patient is felt to be  doing well clinically.  He is hemodynamically stable.  He remains in chronic  atrial fibrillation with good control of his rate.  His Coumadin has been  monitored daily and adjusted based on INR.  Most recent INR dated September 21, 2005 is 2.5.  Electrolytes, BUN, and creatinine are all within normal  limits.  He is afebrile.  Oxygen has been weaned.  He maintains good  saturations on room air.  Incisions are healing well without evidence of  infection.  He is tolerating routine activities commensurate to level of  postoperative convalescence using routine postoperative modalities.  Overall, his status is felt to be stable and  adequate for discharge on  September 21, 2005.   DISCHARGE MEDICATIONS:  1.  Aspirin 81 mg daily.  2.  Toprol XL 25 mg daily.  3.  Protonix 40 mg daily.  4.  Vytorin 10/20 one daily.  5.  Clonazepam 0.5 mg at bedtime p.r.n.  6.  Coumadin.  He is to resume his preoperative schedule with dosing      adjustment through [facility].  7.  Niferex 150 mg daily.  8.  Lasix 40 mg daily.  9.  Potassium chloride 20 mEq daily.  10. Vancomycin intravenous as followed through the pharmacist at the home      health company.  11. Additionally, voriconazole intravenous also through the home nursing      management.  12. The medications for pain will be Ultram 50 mg one or two tablets every      six hours as needed.   INSTRUCTIONS:  The patient will receive written instructions in regard to  medications, activity, diet wound care, and follow-up.  Follow-up will  include Dr. Roxan Hockey as a request.  Additionally, he will see Dr. Jens Som  two weeks, Dr. Cornelius Moras three weeks.  The office will call with an appointment.  He will see Dr. Osborn Coho at ENT at 12:45 on October 11, 2005.   CONDITION ON DISCHARGE:  Stable and improved.   FINAL  DIAGNOSES:  1.  Prosthetic valve endocarditis with evidence of fungus based on hyphae      from pathologic specimen.  Otherwise, has one culture for coagulase-      negative Staphylococcus.  2.  History of myxomatous degenerative disease of the mitral valve with      mitral valve prolapse status post mitral valve repair October 12, 2004      and repeat this hospitalization as described with debridement of the      former valve annulus and removal of previous ring.  3.  Persistent atrial fibrillation status post Maze procedure.  4.  History of multiple cardioembolic strokes as described above.  5.  Hyperlipidemia.  6.  Remote tobacco use.  7.  Remote congestive heart failure.  8.  Hypertension.  9.  Prostate cancer.  10. Anxiety.  11. Moderate right coronary artery stenosis.  12. Erectile dysfunction.  13. History benign adrenal adenoma.  14. History of chronic obstructive pulmonary disease. 15. History of pneumonia complicated by respiratory failure June 2006.  16. History of L4-5 diskitis treated medically.  17. 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      Rowe Clack, P.A.-C.      Salvatore Decent. Cornelius Moras, M.D.  Electronically Signed    WEG/MEDQ  D:  09/21/2005  T:  09/21/2005  Job:  [id number]   cc:   Genene Churn. Love, M.D.  Fax: [phone]   Rockey Situ. Flavia Shipper., M.D.  Fax: [phone]   Kinnie Scales. Annalee Genta, M.D.  Fax: [phone]

## 2011-02-04 NOTE — Cardiovascular Report (Signed)
[name], [name] NO.:  [id number]   MEDICAL RECORD NO.:  [id number]          PATIENT TYPE:  OIB   LOCATION:  2899                         FACILITY:  [facility]   PHYSICIAN:  Olga Millers, M.D. LHCDATE OF BIRTH:  [birth year]   DATE OF PROCEDURE:  03/27/2006  DATE OF DISCHARGE:                              CARDIAC CATHETERIZATION   PROCEDURE:  Cardioversion of atrial flutter.   The patient was sedated with Pentothal 150 mg intravenously.  Synchronized  cardioversion (biphasic) with 120 joules resulted in normal sinus rhythm  with a first degree AV block.  There were no immediate complications.  We  recommend continuing Coumadin with outpatient follow-up as scheduled.           ______________________________  Olga Millers, M.D. [facility]     BC/MEDQ  D:  03/27/2006  T:  03/27/2006  Job:  [id number]

## 2011-02-04 NOTE — Op Note (Signed)
NAMETIMATHY, NEWBERRY NO.:  [id number]   MEDICAL RECORD NO.:  [id number]          PATIENT TYPE:  OUT   LOCATION:  PULM                         FACILITY:  [facility]   PHYSICIAN:  Salvatore Decent. Cornelius Moras, M.D. DATE OF BIRTH:  04/20/1936   DATE OF PROCEDURE:  09/22/2004  DATE OF DISCHARGE:                                 OPERATIVE REPORT   PREOPERATIVE DIAGNOSIS:  Mitral valve prolapse with severe mitral  regurgitation and persistent atrial fibrillation.   POSTOPERATIVE DIAGNOSIS:  Mitral valve prolapse with severe mitral  regurgitation and persistent atrial fibrillation.   PROCEDURE:  Median sternotomy for mitral valve repair (quadrangular  resection of the posterior leaflet with sliding leaflet annuloplasty,  commissuroplasty of the posterior commissure, artificial Gore-Tex cords x2  to the anterior leaflet, #26 mm Edwards physio ring annuloplasty) and  modified Cox Maze IV procedure.   SURGEON:  Salvatore Decent. Cornelius Moras, M.D.   ASSISTANT:  Ms. Melvia Heaps, RNFA.   ANESTHESIA:  General.   BRIEF CLINICAL NOTE:  The patient is a 75 year old gentleman from  [place], [place] followed by Dr. Marga Melnick and referred by  Dr. Olga Millers for management of mitral regurgitation and atrial  fibrillation. The patient presents with exertional shortness of breath which  has become much worse recently. The patient also has history of paroxysmal  atrial flutter and atrial fibrillation in the past and he now presents with  persistent atrial fibrillation. Echocardiogram demonstrates mitral valve  prolapse with severe (4+) mitral regurgitation. There is normal left  ventricular function. Left and right heart catheterization demonstrated no  significant coronary artery disease. A full consultation has been dictated  previously.   OPERATIVE CONSENT:  The patient and his wife have been counseled at length  regarding the indications and potential benefits of surgical  intervention  with plans for attempted mitral valve repair or mitral valve replacement if  repair is not feasible. They also understand the plan for concomitant maze  procedure in an effort to treat the patient's underlying atrial  fibrillation. Alternative treatment strategies have been reviewed in detail.  They understand and accept all associated risks of surgery and desired to  proceed as described.   OPERATIVE FINDINGS:  1.  Myxomatous disease of the mitral valve with prolapse involving both the      anterior and the posterior leaflets of the mitral valve and flail      segment of the posterior leaflet (P2).  2.  Normal left ventricular function.  3.  No residual mitral regurgitation after successful repair based upon      follow-up transesophageal echocardiogram performed after separation from      bypass.   DESCRIPTION OF PROCEDURE:  The patient was brought to the operating room on  the above-mentioned date and central monitoring was established by the  anesthesia service under the care and direction of Dr. Adonis Huguenin.  Specifically, a Swan-Ganz catheter was placed through the right internal  jugular approach. A radial arterial line was placed. Intravenous antibiotics  were administered. Following induction with general endotracheal anesthesia,  a Foley catheter was placed.  The patient's chest, abdomen, both groins, and  both lower extremities were prepared and draped in sterile manner.   Baseline transesophageal echocardiogram was performed by Dr. Adonis Huguenin.  Findings were consistent with the patient's known history of degenerative  disease of the mitral valve with flail segment of the middle portion of the  posterior leaflet (P2) and severe (4+) mitral regurgitation. Left  ventricular function appears normal. Aortic valve appears normal. There is  only mild tricuspid regurgitation. No other abnormalities were noted.   A median sternotomy incision was performed. The  pericardium was opened. The  ascending aorta is normal in appearance. The patient was heparinized  systemically. The ascending aorta was cannulated for cardiopulmonary bypass.  The venous cannula was placed directly in the superior vena cava. A second  venous cannula was placed low in the right atrium with tip extending down  the inferior vena cava. A retrograde cardioplegic catheter was placed  through the right atrium into the coronary sinus.   Cardiopulmonary bypass was begun. Vessel loops were placed around the  superior vena cava and inferior vena cava. A temperature probe was placed in  the left ventricular septum. Cardioplegic catheter was placed in the  ascending aorta.   The patient was cooled to 28 degrees systemic temperature. The aortic  crossclamp was applied and cardioplegiawais delivered initially in an  antegrade fashion through the aortic root. Iced saline slush was applied for  topical hypothermia. Supplemental cardioplegia was administered retrograde  to the coronary sinus catheter. The initial cardioplegic arrest and  myocardial cooling was felt to be excellent. Repeat doses of cardioplegia  administered intermittently throughout the crossclamp portion of the  operation retrograde through the coronary sinus catheter to maintain septal  temperature below 15 degrees centigrade.   The heart was gently retracted towards the surgeon's side to expose the left-  sided pulmonary veins.  A vessel loop was passed around the origin of the  pulmonary veins to facilitate retraction. The Medtronic Cardioblate  irrigated radiofrequency bipolar ablation device was utilized to create an  elliptical ablation lesion around the origin of the left-sided pulmonary  veins on the surface of the left atrium itself.  After completion of this  lesion, a similar elliptical lesion was placed across the base of the left atrial appendage. Subsequent to this, a linear lesion was created using the   monopolar irrigated radiofrequency ablation device to join the ellipse  surrounding the left-sided pulmonary veins with the ellipse surrounding the  base of left atrial appendage.   The heart was now replaced into the pericardial space. A left atriotomy  incision was performed posteriorly through the interatrial groove. This  incision was extended part way across the floor of the left atrium  inferiorly. The floor of the left atrium was now exposed using a self-  retaining retractor. The bipolar radiofrequency ablation device was now  utilized to complete an elliptical lesion around the origin of the right-  sided pulmonary veins. The atriotomy incision served as the anterior half of  this ellipse, and the posterior half was completed with the bipolar device  using one limited device placed posterior to the left atrium and the other  limb along the endocardial surface. A linear ablation lesion was now created  across the dome of the left atrium joining this cephalad apex of the  atriotomy incision with the cephalad apex of the elliptical lesion  surrounding the left-sided pulmonary veins. This was again performed with  one limb of the  bipolar device placed posterior to the left atrium and the  other limb placed on the endocardial surface. A similar linear lesion was  extended across the floor of the left atrium from the inferior apex of the  atriotomy incision to reach the inferior apex of the ellipse surrounding the  left-sided pulmonary veins. One final bipolar lesion was now created from  the inferior apex of the atriotomy incision extended towards the mitral  valve annulus as far as it can reach before reaching the AV groove. This  lesion was completed all the way onto the mitral valve annulus using the  monopolar irrigating radiofrequency ablation device along the endocardial  surface. This completes the left-sided lesion set of the Cox Maze procedure.   The mitral valve was exposed  using a self-retaining retractor. Exposure was  felt to be excellent. The mitral valve was carefully examined. There is  obvious myxomatous degenerative disease involving the mitral valve with  bileaflet prolapse. The prolapse was much more prominent in the posterior  leaflet was obvious flail segment of the posterior leaflet (P2). There are  several ruptured cords to this segment of the posterior leaflet. There is  prolapse involving the posterior commissure and mild prolapse involving the  A2 portion of the anterior leaflet.   Quadrangular resection of the flail segment (P2) of the posterior leaflet is  performed. Sliding leaflet annuloplasty is now performed by initially  mobilizing both the P1 and the P3 portions of the posterior leaflet off of  the posterior annulus somewhat in both directions. The quadrangular  resection segment was slightly eccentrically located towards the anterior commissure, but essentially comprises the majority of the P2 component of  posterior leaflet. After the P1 and P3 portions were both mobilized off of  the posterior annulus, several interrupted figure-of-eight compression  sutures were placed with 2-0 Ethibond suture to shorten the posterior  annulus circumference.  2-0 Ethibond horizontal mattress ring annuloplasty  sutures were now placed circumferentially around the entire mitral annulus.  The P1 and P3 portions of the posterior leaflet are now reattached to the  posterior annulus using two-layer closure of running 4-0 Ethibond suture.  The remaining vertical defect in the posterior leaflet was closed using  interrupted 5-0 Ethibond everting simple sutures. The prolapse involving the  posterior commissure was now corrected using several interrupted 4-0  Ethibond everting mattress sutures to slightly close the commissure.  Finally, two Gore-Tex artificial cords were placed to correct the anterior  prolapse. CV4 pledgeted Gore-Tex sutures were placed  initially through the  heads of both the anterior and posterior papillary muscle. After these  sutures were tied to secure to the head of the papillary muscle, the Gore-  Tex cords were now placed into the left ventricular chamber with subsequent  later plans for attachment to the free margin of the anterior leaflet after  ring annuloplasty was completed.   The mitral annulus was sized to accept a 26 mm annuloplasty ring based upon  the approximate size of the anterior leaflet and the intertrigonal distance.  Ring annuloplasty is completed by implanting an [employer] physio  ring annuloplasty ring (model number 4450, serial number [id number]). The ring  was secured in place uneventfully. After completion of the ring  annuloplasty, the two Gore-Tex artificial cords were now each passed through  the free margin of the anterior leaflet of the mitral valve and subsequently  tied to an appropriate length. Competence of the mitral valve was now  verified  by instilling iced saline into the left ventricular chamber.  Due  to some residual prolapse in the posterior commissure, additional  commissuroplasty sutures were placed with 4-0 Ethibond sutures in a similar  fashion to those described previously. After completion of the mitral valve,  the valve was again tested by instilling iced saline into the left  ventricular chamber. The valve appears to be perfectly competent.   Rewarming was begun. The left atrial appendage was oversewn from within the  left atrium using a two-layer closure of running 3-0 Prolene suture.   An oblique right atriotomy incision was performed extending from the acute  margin of the heart anteriorly in an oblique fashion oriented towards the  right inferior pulmonary vein. The tip of the right atrial appendage was  amputated. The irrigated bipolar radiofrequency ablation device was now utilized to create several additional ablation lesions on the right side.   Initially the lesion was placed from the posterior apex of the right  atriotomy incision extending in a cephalad direction and the posterior  surface of the superior vena cava. A second lesion was now performed in the  opposite direction extending from the posterior apex of the atriotomy  incision down onto the posterior surface of the inferior vena cava.  Following this, a linear lesion was created across the interatrial septum to  reach the inferior rim of the fossa ovalis. This was performed with one limb  of the ablation device in the left atrium and the other limb within the  right atrium. A linear lesion was then created from the tip of the right  atrial appendage extending several centimeters in the direction oriented  perpendicular to the other oblique atriotomy incision. Finally, one last  bipolar lesion was created from the tip the right atrial appendage to reach  the anterior margin of the right heart anteriorly.   The left atriotomy incision was now closed using a two-layer closure of  running 3-0 Prolene suture. The patient is placed in Trendelenburg position.  One final dose of warm retrograde hot shot cardioplegia was administered.  The lungs were ventilated. Aortic crossclamp is removed after a total  crossclamp time of 169 minutes.   The remainder of the right-sided lesion set of the Cox Maze procedure was  now completed with the monopolar hand held irrigated radiofrequency ablation  device. Initially, an ablation lesion was created beginning at the end of  the previous lesion at the inferior rim of the fossa ovalis extending across  the inferior rim the coronary sinus and across the isthmus of the right  atrium to reach the tricuspid annulus along its posterolateral surface. This  lesion was then connected down to reach the inferior vena cava medially. A  short linear lesion was now created from the apex of the atriotomy incision  along the acute margin heart to reach  the tricuspid valve annulus along its  anterior rim. Finally, one short ablation lesion was created from the tip of  the previous linear ablation lesion along the anterior medial surface of the  right atrium at the base of the right atrial appendage to reach the  tricuspid annulus along its anterior medial border. This completes the right-  sided lesion set of the Cox maze procedure.   The two right atriotomy incisions were now closed using running 4-0 Prolene  suture. The vessel loops were removed. The lungs were ventilated. The heart  allowed to fill while the aortic root was vented. Epicardial pacing wires  were fixed to the right ventricular outflow tract and to the right atrium.  Normal sinus rhythm resumed spontaneously. Low-dose dopamine infusion was  begun. The IVC cannula was removed and its cannulation site oversewn with  Prolene suture.  The patient weaned from cardiopulmonary bypass without difficulty. The  patient's rhythm at separation from bypass is normal sinus rhythm. The  patient weaned from bypass on dopamine at 3 mcg/kg per minute. Total  cardiopulmonary bypass time the operation was 211 minutes.   Follow-up transesophageal echocardiogram performed by Dr. Krista Blue after  separation from bypass demonstrates a well seated mitral annuloplasty ring  with normal function of the mitral valve and no residual mitral  regurgitation. Left ventricular function appears normal. No other  abnormalities were noted. There is no significant residual air.   The aortic root vent was removed. The SVC cannula was removed. The aortic  cannula was removed. Protamine was administered to reverse the  anticoagulation. The mediastinum was irrigated with saline solution  containing vancomycin. Meticulous surgical hemostasis ascertained. The  mediastinum and the right pleural space were drained with three chest tubes  exited through separate stab incisions inferiorly. The median sternotomy was   closed in routine fashion. Soft tissues anterior to the sternum were closed  in multiple layers and the skin was closed with running subcuticular skin  closure.   The patient tolerated the procedure well and was transported to the surgical  intensive care unit in stable condition. There are no intraoperative  complications. All sponge, instrument and needle counts were verified  correct at completion the operation. No blood products were administered.      Clar   CHO/MEDQ  D:  09/22/2004  T:  09/22/2004  Job:  [id number]   cc:   Olga Millers, M.D. [facility]   Charlton Haws, M.D.   Titus Dubin. Alwyn Ren, M.D. [facility]

## 2011-02-04 NOTE — Discharge Summary (Signed)
NAMEJACERE, PANGBORN NO.:  [id number]   MEDICAL RECORD NO.:  [id number]          PATIENT TYPE:  IPS   LOCATION:  4153                         FACILITY:  [facility]   PHYSICIAN:  Ellwood Dense, M.D.   DATE OF BIRTH:  August 09, 1936   DATE OF ADMISSION:  03/01/2005  DATE OF DISCHARGE:  03/08/2005                                 DISCHARGE SUMMARY   DISCHARGE DIAGNOSES:  1.  Deconditioning secondary to critical illnesses with question of      diskitis.  2.  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  3.  Atrial fibrillation.  4.  Hyperglycemia.  5.  Hypertension.   HISTORY OF PRESENT ILLNESS:  Mr. Wessell is a 75 year old male with a history  of COPD, MVR with maze procedure in January of 2006 and persistent A-fib,  postoperatively on Coumadin treatment.  On May 28th, patient was admitted to  hospital with back pain with radiculopathy and difficulty ambulating as well  as low-grade fevers.  Dr. Sandria Manly was consulted for input, and MRI of spine  showed marked degenerative changes at L4-L5 with edema and a question of  diskitis.  The patient was started on Rocephin.  One blood culture positive  for Staph.  ID was consulted for input.  They recommended adding IV Vanc.  On May 29th, the patient continued with shortness of breath, and a CT angio  of chest being positive for PE on therapeutic Coumadin.  Hem/Onc was  consulted for input and recommended bilateral lower extremity CTs that were  negative for DVT.  Coagulopathy panel walls were drawn.  The patient did  develop respiratory failure requiring intubation on May 31st.  Critical Care  Medicine reevaluated the patient and felt that the patient's cause of  respiratory failure was most likely to air space disease with his pneumonia,  as the scans were indeterminate for PE.  As the patient continued with  fevers despite antibiotics, TEE was done on June 6th and showed a mitral  valve mass with some thrombus.  Cardiology felt  mass was secondary to  vegetation and they recommended treatment with IV antibiotics x6 weeks with  repeat TEE and blood cultures at that period of time.  If blood cultures  remained positive, the patient would require surgery for resection.  ID  recommended continuing Zosyn through June 12th, and Rocephin and IV Vanc for  six total weeks of therapy.  Other issues have included problems with fluid  overload requiring diuresis, right pleural effusion requiring thoracentesis,  greater than 15 mL __________ with blood-tinged fluids, acute renal failure  that has resolved, anemia, continued low back pain.  Dr. Sandria Manly recommended  repeating MRI in three to four weeks.  Currently, patient remains at min to  mod-assist for transfers, min-assist to guard for ambulating 40 feet in  order to be reconditioned.  Rehab was consulted for further therapies.   PAST MEDICAL HISTORY:  1.  COPD.  2.  MVR with maze procedure, January 2006.  3.  Prostatectomy for cancer.  4.  Adrenal adenoma.  5.  Chronic renal insufficiency  6.  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  7.  Diplopia and headaches.  8.  Right inguinal hernia repair.  9.  Hypertension.   ALLERGIES:  There are no known drug allergies.   SOCIAL HISTORY:  The patient is married, lives in two-level home with two  steps at entry, can stay with relatives past discharge.  He does not use any  tobacco or alcohol.   HOSPITAL COURSE:  Mr. Gen Clagg was admitted to Rehab on March 01, 2005,  for inpatient therapies to consist of PT-OT daily.  Past admission, he was  maintained on p.r.n. Roxicet and Flexeril for pain control.  IV Vanc and  Rocephin were continued throughout his stay.  The patient with history of  hyperglycemia and acute __________ questioned secondary to infection.  He  was maintained on Amaryl, and Lantus insulin was dc 'd.  CBGs were checked  on an a.c. and h.s. basis initially, and hemoglobin A1c was checked past   admission was noted to be borderline high at 6.2.  Amaryl was dc 'd and diet  was changed to a carb-modified moderate.  At time of discharge, patient's  blood sugars ranging in 80s to 115 range with urine tape being reasonable.  Dr. Phoebe Perch has followed up with patient and recommends continuing with IV  antibiotics for now and no Neurosurgical intervention indicated, and patient  to follow up with ID past discharge.   Other labs checked past admission include follow up of CBC revealing  hemoglobin 9.7, hematocrit 28.4, white count 9.6, platelet count 465.  Check  of lytes revealed a sodium of 140, potassium 3.4, chloride 108, CO2 26, BUN  17, creatinine 1.9, glucose 105.  Patient's heart rate remained in control,  ranging from 80s to 90s range, during his stay.  No hypoxia reported with  activity.  By the time of discharge, patient was at supervision level for  transfers, supervision level for ambulating 200 feet with a rolling walker,  he did require verbal queues occasionally.  He was at modified-independent  level for upper body care, min-assist for lower body care, supervision to  modified-independent for toileting.  Further followup therapies to include  Home Health PT-OT by [facility].  A Home Health RN has been arranged  for monitoring IV's.  Rocephin and Vancomycin to continue to March 29, 2005,  to complete antibiotic course.  On March 08, 2005, patient is discharged to  home.   DISCHARGE MEDICATIONS:  1.  IV Vanc through July 11th per Pharmacy.  2.  IV Rocephin through 1 gm q.24h.  3.  Coumadin 5 mg q.p.m.  4.  Digoxin 0.125 mg a day.  5.  Cardizem-CD 120 mg a day.  6.  K-Dur 10 mEq a day.  7.  Oxy-IR 5-10 mg q.6h p.r.n. pain.  8.  Flexeril 5 mg t.i.d. p.r.n. spasms.  9.  Xanax 0.25 mg b.i.d. p.r.n.   ACTIVITY LEVEL:  As tolerated.   DIET:  Carb-modified moderate.  SPECIAL INSTRUCTIONS:  He will __________ on June 22nd, with results to [facility] [place].   IV antibiotics starting 6 p.m. on June 20th.  Patient  to follow up with Dr. Sandria Manly, Dr. Alwyn Ren, Dr. Jens Som, and Infectious Disease  in the next few weeks.  Follow up with Dr. Lamar Benes as needed.      Rinaldo Cloud   PP/MEDQ  D:  04/25/2005  T:  04/25/2005  Job:  [id number]   cc:   Titus Dubin. Alwyn Ren,  M.D. [facility]   Genene Churn. Love, M.D.  [address]  [address]  [place]  [place] [address]  Fax: [phone]   Lacretia Leigh. Ninetta Lights, M.D.  [address]  [place]  [place] [address]  Fax: [phone]   Clydene Fake, M.D.  [address]  [place]  [place] [address]  Fax: [phone]

## 2011-02-04 NOTE — Discharge Summary (Signed)
NAMEAKONI, PARTON NO.:  [id number]   MEDICAL RECORD NO.:  [id number]          PATIENT TYPE:  INP   LOCATION:  3739                         FACILITY:  [facility]   PHYSICIAN:  Corwin Levins, M.D. LHCDATE OF BIRTH:  Apr 16, 1936   DATE OF ADMISSION:  09/14/2004  DATE OF DISCHARGE:                                 DISCHARGE SUMMARY   ADDENDUM   DISCHARGE DIAGNOSES:  1.  Mitral valve prolapse with severe mitral regurgitation.  2.  Class III congestive heart failure.  3.  Hypertension.  4.  Atrial fibrillation/atrial flutter.  5.  Anxiety.  6.  Prostate cancer.  7.  Erectile dysfunction.  8.  Chronic obstructive pulmonary disease.  9.  Hypercholesterolemia.  10. Status post left parotid Warthin's tumor removal.  11. Anxiety.  12. History of adrenal adenoma.   PROCEDURES:  2D echocardiogram.   CONSULTANTS:  Cardiology, CVTS.   HISTORY/PHYSICAL:  See dictation on the day of admission.   HOSPITAL COURSE:  [name] is a very nice 75 year old white male admitted  initially with what appeared to be a new-onset atrial fibrillation.  He has  had atrial flutter in the past, having seen Duke Salvia, M.D. but with  symptoms at this time, he is admitted, felt to have class III CHF.  The 2D  echocardiogram revealed severe mitral regurgitation likely contributing.  The initial plan was for four weeks of anticoagulation with Coumadin, but  with echocardiogram results and CVTS as consultation, it is felt that he is  best served by mitral valve replacement.  He is otherwise ambulatory,  stable, and doing well.  No other issue is noted at this admission.  At the  time of discharge, he is felt to be stable for __________ beginning today.   DISPOSITION:  Discharged to home in good condition.   DISCHARGE MEDICATIONS:  1.  Vytorin 10/20 one p.o. daily.  2.  Cardizem CD 120 mg p.o. daily.   FOLLOWUP:  He is to follow up with a pulmonary function test at [facility] on  Tuesday, September 21, 2004, arranged by CVTS.  He is also to be  readmitted on September 22, 2004, per CVTS with Lopressor 12.5 mg p.o. x1 dose  that a.m. for planned mitral valve replacement surgery and maze procedure  for atrial flutter/fibrillation.   ACTIVITY/DIET:  No activity or dietary restrictions.       JWJ/MEDQ  D:  09/18/2004  T:  09/18/2004  Job:  [id number]   cc:   Titus Dubin. Alwyn Ren, M.D. [facility]

## 2011-02-04 NOTE — Discharge Summary (Signed)
NAMEJETHRO, RADKE NO.:  [id number]   MEDICAL RECORD NO.:  [id number]          PATIENT TYPE:  INP   LOCATION:  3739                         FACILITY:  [facility]   PHYSICIAN:  Rene Paci, M.D. LHCDATE OF BIRTH:  11/29/1935   DATE OF ADMISSION:  09/14/2004  DATE OF DISCHARGE:  09/17/2004                                 DISCHARGE SUMMARY   DISCHARGE DIAGNOSES:  1.  Severe mitral regurgitation.  2.  Atrial fibrillation.   BRIEF ADMISSION HISTORY:  Mr. Bahri is a 75 year old white male with a  remote history of PSVT, status post cardioversion in November of 2003.  The  patient presented with shortness of breath.  He was found to be in atrial  fibrillation.  The patient was admitted for new-onset atrial fibrillation.   PAST MEDICAL HISTORY:  1.  History of PSVT in November of 2003.  2.  Status post outpatient hernia repairs, inguinal hernia and umbilical      hernia; this was in 2001.  3.  Status post cataract surgery in January 2004.  4.  History of a negative colonoscopy in 2004.  5.  Status post left superficial parotidectomy for a Wharton's tumor, April      2004.  6.  Prostate cancer by biopsy in 2004, status post prostatectomy, stage T2-3      with a Gleason score of 7-10.  7.  Treated with prostaglandin for erectile dysfunction.  8.  Chronic obstructive pulmonary disease.  9.  A history of tobacco use, quit in 2004.  10. Adrenal adenoma.   HOSPITAL COURSE:  PROBLEM #1 - CARDIOVASCULAR:  The patient presented with  new-onset atrial fibrillation.  Rate was controlled.  The patient was  started on heparin.  The TSH was normal.  Serial cardiac enzymes were  negative.  The patient had a 2-D echocardiogram which revealed normal LV  function, but bilateral mitral valve prolapse, posterior leaflet greater  than the anterior leaf, pretty severe-looking MR.  Cardiology was consulted.  They felt the combination of the severe MR and atrial fibrillation were  contributing to his dyspnea.  Dr. Jens Som recommended a TEE to more fully  evaluate the mitral valve morphology and severity of the MR; he also  suspected he was going to need a right and left heart catheterization.  On  September 16, 2004, the patient did have a TEE; this revealed a flail segment  of posterior leaflet mitral valve with severe MR.  The patient had normal  LV, normal aortic valve, tricuspid valve and pulmonary valve, no left atrial  thrombus, no ASD, no aortic debris.  The patient then underwent a cardiac  catheterization.  The patient had 40% mid  RCA stenosis, again, EF preserved  with severe 4+ MR.  Dr. Jens Som recommended the patient for a surgical  consult for a mitral valve repair versus replacement.  The patient has  undergone ABIs, anticipating surgery.  The patient's Cardizem has been  changed to long-acting Cardizem.  We anticipate CVTS will see the patient  today, but discharge the patient home to return for surgery.  Currently,  the  question of anticoagulation at discharge has not been answered.   PROBLEM #2 - PULMONARY:  As noted, the patient presented with dyspnea  secondary to a combination of severe MR and atrial fibrillation.  The  patient is maintaining SATS at 93% or greater on room air.   MEDICATIONS AT DISCHARGE:  1.  Vytorin 10/20 daily.  2.  Cardizem CD 120 mg daily.  3.  Coumadin to be determined prior to discharge.   FOLLOWUP:  The patient will follow up with Dr. Titus Dubin. Hopper at some  point after surgery; again, the timing of his mitral valve repair has not  been determined yet.      Laur   LC/MEDQ  D:  09/17/2004  T:  09/17/2004  Job:  [id number]   cc:   Titus Dubin. Alwyn Ren, M.D. [facility] Cornelius Moras, M.D.  [address]  [place]  [place] [address]   Olga Millers, M.D. [facility]

## 2011-02-04 NOTE — Op Note (Signed)
NAMEDERIN, GRANQUIST NO.:  [id number]   MEDICAL RECORD NO.:  [id number]          PATIENT TYPE:  INP   LOCATION:                               FACILITY:  [facility]   PHYSICIAN:  Quita Skye. Krista Blue, M.D.  DATE OF BIRTH:  1936/07/05   DATE OF PROCEDURE:  09/22/2004  DATE OF DISCHARGE:                                 OPERATIVE REPORT   PROCEDURE:  Transesophageal echocardiogram.   DIAGNOSIS:  Mitral valve regurgitation.   Mr. [name] is a 75 year old white male who presents to the operating  room for aortic valve repair MAZE procedure by Dr. Tressie Stalker.  Dr. Cornelius Moras  requested transesophageal echocardiogram for the intraoperative management  of this patient.  Following routine cardiac induction, the transesophageal  probe was lubricated and enclosed in a plastic latex-free sheath.  The probe  was then carefully inserted in the patient's esophagus for imaging.  Overall  images of the heart demonstrated no evidence of pericardial effusion or  masses.  The right atrium appeared to be normal in size, approximately 4 x 5  cm with intact intra-atrial septum.  There was no evidence of atrial septal  defect.  The tricuspid valve showed trace regurgitation with a normal-  appearing valve structure.  The right ventricle had good contractility  without evidence of segmental wall motion abnormalities.  The left atrium  was large, approximately 5.  It was difficult to measure but the intra-  atrial septum to lateral wall was approximately 5 to 6 cm.  There was  moderate to severe aortic regurgitation, no evidence of intra-atrial masses.  The appendage appeared to be clear of thrombus or mass.  The pulmonary vein  flow demonstrated some blunted systolic flow with an S wave of approximately  19 cm per second and a D wave of approximately 37 cm per second.  This  appeared to demonstrate blunting.  The mitral valve had a central area on  the posterior leaflet that was flailed.  The  anterior leaflet, although  mildly redundant appeared to not prolapse into the atrium.  There was a  broad regurgitant jet that was centrally located traveling back to the  posterior wall of the atrium.  The left ventricle had good contractility  with no evidence of segmental wall motion abnormalities and the aortic valve  was normal without evidence of stenosis.  There was trace regurgitation that  was felt to be not significant.  The thoracic aorta had some mild  atherosclerotic disease in the descending portion but no evidence of  dissection or aneurysm.  Following repair of the mitral valve, follow-up TEE  examinations demonstrated good function of the mitral valve with no evidence  of prolapse or flail leaflet.  There was essentially no regurgitation at all  and no evidence of stenosis with peak flows crossing the mitral valve  approximately 140 cm per second. The patient tolerated the procedure well  and continued to do well.  The left ventricle had good contractility without  evidence of segmental wall motion abnormalities.  The probe was removed and  the patient  was taken to the SICU in good condition.        ___________________________________________  Quita Skye Krista Blue, M.D.    JDS/MEDQ  D:  09/22/2004  T:  09/22/2004  Job:  [id number]

## 2011-02-04 NOTE — Cardiovascular Report (Signed)
[name], [name] NO.:  [id number]   MEDICAL RECORD NO.:  [id number]          PATIENT TYPE:  INP   LOCATION:  2311                         FACILITY:  [facility]   PHYSICIAN:  Arvilla Meres, M.D. [facility] OF BIRTH:  07-06-1936   DATE OF PROCEDURE:  02/22/2005  DATE OF DISCHARGE:                              CARDIAC CATHETERIZATION   PROCEDURE:  Transesophageal echocardiogram.   IDENTIFYING INFORMATION/JUSTIFICATION FOR ADMISSION AND CARE:  Mr. Moomaw is  a 75 year old male who is status post previous mitral valve repair and maze  procedure, who is admitted with respiratory failure and persistent fevers of  unclear origin. He was referred for transesophageal echocardiogram to rule  out  infective endocarditis.   DESCRIPTION OF PROCEDURE:  Risks and benefits of the transesophageal  echocardiogram were explained to Mr. Eland and his family. Consent was  signed and placed on the chart. Conscious sedation was achieved with 2 mg of  Versed and 50 mcg of Fentanyl in a staged fashion. His throat was  anesthetized with viscus Lidocaine and Cetacaine spray prior to sedation.  The TEE probe was passed easily into the distal esophagus. There were no  apparent complications.   FINDINGS:  1.  Normal left ventricular function.  2.  Left atrial enlargement.  3.  Mitral valve:  The mitral valve repair appears stable. There is a      moderate-sized mass (thrombus versus vegetation) adherent to the atrial      side of the anterior leaflet of the mitral valve. In some views the mass      appears to extend over to the aortic valve but this is most likely      artifactual due to shadowing. I cannot see any clear evidence of      involvement of the aortic valve on the short axis images. There is no      evidence of abscess. There is mild mitral stenosis with a mitral valve      area of 1.8 cm2. There is trivial mitral regurgitation as well as a      trivial perivalvular leak.  4.  Aortic valve is trileaflet and fully mobile. The valve is stable. There      is trivial aortic insufficiency. As above. there is no definitive mass      on the aortic valve.  5.  Tricuspid valve is mobile. There is no vegetation or mass. There is no      significant tricuspid regurgitation.  6.  Pulmonic valve is normal. There is trivial pulmonic insufficiency.  7.  Inter-atrial septum is intact. There is no shunt by color flow.  8.  Left atrial appendage appears surgically amputated.   CONCLUSION:  Moderate sized vegetation versus thrombus adherent to the  anterior mitral valve leaflet. In some images there also appears to be  involvement of the aortic valve but I suspect this may just be artifactual  and there is  no definitive evidence of aortic valve involvement. The mitral valve appears  mechanically stable without evidence of abscess. Would recommend continued  therapy with broad-spectrum antibiotics and anti-coagulation;  there does not  appear to be any current indcation for operative intervention. Primary and  consulting teams notified of findingd.      DB/MEDQ  D:  02/22/2005  T:  02/22/2005  Job:  [id number]   cc:   Titus Dubin. Alwyn Ren, M.D. [facility]   Oley Balm. Sung Amabile, M.D. [facility]

## 2011-02-04 NOTE — Discharge Summary (Signed)
NAMEONEY, FOLZ NO.:  [id number]   MEDICAL RECORD NO.:  [id number]          PATIENT TYPE:  INP   LOCATION:  2030                         FACILITY:  [facility]   PHYSICIAN:  Salvatore Decent. Cornelius Moras, M.D. DATE OF BIRTH:  February 23, 1936   DATE OF ADMISSION:  09/22/2004  DATE OF DISCHARGE:  09/29/2004                                 DISCHARGE SUMMARY   ADMISSION DIAGNOSIS:  Mitral valve prolapse with severe mitral regurgitation  and persistent atrial fibrillation.   DISCHARGE DIAGNOSIS:  1.  Severe mitral valve prolapse with severe mitral regurgitation and      persistent atrial fibrillation, status post mitral valve repair and Cox-      Maze IV procedure completed by Dr. Cornelius Moras on September 22, 2004.  2.  Persistent rate controlled atrial fibrillation, now on chronic      anticoagulation therapy.  3.  History of dysphagia with modified barium swallow study showing mild      oropharyngeal dysphagia and no evidence of aspiration.  4.  Hypertension.  5.  Hyperlipidemia.  6.  Prostate cancer status post radical prostatectomy.  7.  Chronic obstructive pulmonary disease.  8.  Cataracts.  9.  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  10. Surgical history includes radical prostatectomy by Dr. Retta Diones; left      superficial parotidectomy; repair of right inguinal hernia and umbilical      hernia; right cataract extraction.   HOSPITAL MANAGEMENT/PROCEDURES:  1.  Median sternotomy for mitral valve repair and modified Cox-Maze IV      procedure completed on September 22, 2004, by Dr. Cornelius Moras.  2.  Interoperative transesophageal echocardiogram.  3.  Initiation of cardiac rehab phase 1.  4.  Modified barium swallow study completed on September 27, 2004, this      revealed mild oropharyngeal dysphagia and penetration of thin liquids      under all conditions tested.  Recommendations were for a mechanical      soft, dysphagia III diet with thin  liquids and moderate aspiration      precautions.   CONSULTATIONS:  1.  Cardiac rehab.  2.  Case management.  3.  Speech pathology.   HISTORY OF PRESENT ILLNESS:  Mr. Gittins is a 75 year old gentleman from  [place], [place], followed by Dr. Marga Melnick and referred by  Dr. Olga Millers for management of mitral regurgitation and atrial  fibrillation.  The patient presented with exertional shortness of breath  which had become much worse in late December.  The patient also has a  history of paroxysmal atrial fibrillation and atrial fibrillation in 2003  and presented with persistent rapid ventricular rate atrial fibrillation in  late December 2005.  The patient was admitted and underwent cardiac  catheterization which demonstrated no evidence of coronary artery disease,  although the patient had severe mitral regurgitation.  With these findings,  a CVTS consultation was initiated for consideration of surgical repair of  the mitral valve as well as surgical ablation for persistent atrial  fibrillation.  Dr. Cornelius Moras saw  and examined the patient in consultation and  agreed that surgical repair of the mitral valve would best treat the  patient's symptoms in addition to a Cox-Maze procedure for his persistent  atrial fibrillation.  The risks, benefits, and alternatives of the procedure  were discussed with the patient and his family at that time.  The patient  voiced understanding and agreed to proceed as planned.  The patient was  discharged home for the holidays and was anticipated to return for elective  cardiac surgery.   HOSPITAL COURSE:  Mr. Rhinehart was admitted electively to [facility]  on September 22, 2004.  The patient was taken to the operating room and  underwent mitral valve repair and modified Cox-Maze IV procedure, completed  by Dr. Cornelius Moras of CVTS.  Overall, the patient tolerated the procedure well and  was transferred to the surgical intensive care unit in  critical but stable  condition.  Interoperative TEE postoperatively showed no evidence of  residual mitral regurgitation.   Postoperatively, the patient progressed in routine fashion without any  complications.  He made a steady progress towards recovery and was  transferred to the step down unit on postoperative day two.  While on the  step down unit, the patient continued to be treated for rate controlled  atrial fibrillation.  The patient did complain of swallowing difficulties  and underwent a modified barium swallow study which showed mild  oropharyngeal dysphagia.  The patient was then given a dysphagia III diet  with appropriate aspiration precautions.  Otherwise, the patient continued  to do well and was deemed appropriate for initiation of discharge planning  on September 28, 2004.  At the time of initiation of discharge planning, the  patient was doing quite well.  He was tolerating a regular diet and had  resumed normal bowel and bladder function.  The patient's pain was well  controlled with oral medications.  The incisions were healing well without  evidence of infection.  His heart rate and rhythm remained stable in a rate  controlled atrial fibrillation.  The patient's anticoagulation therapy was  therapeutic with an INR of 2.1 on 5 mg Warfarin daily.  The patient was  ambulating with cardiac rehab without any difficulties.   DISPOSITION:  Mr. Zuercher is being discharged to home in improved and stable  condition on September 29, 2004, pending a.m. rounds and no change in the  patient's clinical status.   DISCHARGE MEDICATIONS:  1.  Toprol XL 25 mg daily.  2.  Vytorin 10/20 mg daily.  3.  Coumadin 5 mg daily.  4.  Zoloft 25 mg daily.  5.  Amiodarone 200 mg daily.  6.  Lasix 40 mg daily for five more days.  7.  Potassium 20 mEq daily for five more days.  8.  Xanax 0.5 mg twice daily as needed. 9.  Ultram 50 mg 1-2 tabs every 4-6 hours as needed for pain.   DISCHARGE  INSTRUCTIONS:  1.  Activities:  The patient is to avoid driving.  He is to avoid heavy      lifting or strenuous activity.  He should continue to walk daily.  He is      to continue his breathing exercises for one more week.  2.  Diet:  The patient is to follow a low fat, low salt diet.  The patient      is to follow the guide lines given from speech therapy, he should remain  seated upright and at 90 degrees with all meals.  He should take small      bites and sips and swallow 2-3 times after each bite.  The patient      should take his medicines in pureed textured foods such as applesauce or      pudding.  3.  Wound care:  The patient may shower.  He should wash his incisions daily      with soap and water.  The patient should notify the CVTS office if he      has any redness, swelling, or drainage from his incisions or if he has a      fever of 101.   SPECIAL INSTRUCTIONS:  1.  An arrangement has been made for the patient to have home health      registered nurse visits provided by [facility].  These visits      will start on Friday, October 01, 2004.  2.  The patient will have his PT INR blood work drawn on Friday, October 01, 2004, by the home health registered nurse.  These results will then be      forwarded to [facility].   FOLLOW UP:  1.  The patient is to see Dr. Jens Som within four weeks of discharge.  The      [facility] office will contact the patient with this exact date      and time.  2.  The patient is scheduled to see Dr. Cornelius Moras within 4-6 weeks of discharge.      The CVTS office will call the patient with the exact date and time.  3.  The patient is to see Dr. Retta Diones of urology as previously scheduled.      Catalina Gravel   CAF/MEDQ  D:  09/29/2004  T:  09/29/2004  Job:  [id number]   cc:   Olga Millers, M.D. [facility]   Titus Dubin. Alwyn Ren, M.D. [facility]

## 2011-02-04 NOTE — Discharge Summary (Signed)
   NAMEMarland Kitchen  COTY, LARSH NO.:  [id number]   MEDICAL RECORD NO.:  [id number]                   PATIENT TYPE:  INP   LOCATION:  0368                                 [facility]ILITY[facility]  [facility]   PHYSICIAN:  Bertram Millard. Dahlstedt, M.D.          DATE OF BIRTH:  [birth year]   DATE OF ADMISSION:  07/14/2003  DATE OF DISCHARGE:  07/17/2003                                 DISCHARGE SUMMARY   PRIMARY DIAGNOSES:  1. Adenocarcinoma of the prostate, pathologic stage T2c, N0, MX, Gleason     7/10 (3+4).  2. Coronary artery disease.  3. History of benign tumor of submandibular gland.   PRINCIPAL PROCEDURE:  Radical retropubic prostatectomy, bilateral pelvic  lymph node dissection performed July 14, 2003.   BRIEF HISTORY:  This 76 year old gentleman has been found to have  adenocarcinoma of the prostate.  This was found upon evaluation of an  elevated PSA which had risen over a year.  The patient underwent biopsy  revealing a Gleason 7/10 adenocarcinoma from both sides of the prostate.  He  has chosen to have surgery.  Preoperatively, he was counseled regarding  other treatment options, especially radiation therapy.  He is aware of risks  and complications and at this point is admitted for further management.   HOSPITAL COURSE:  The patient was admitted directly to the operating room.  On July 14, 2003, he underwent bilateral pelvic lymph node dissection,  radical retropubic prostatectomy.  He tolerated this procedure well.  Estimated blood loss was approximately 800 mL.  The patient maintained a  stable hematocrit postoperatively.  He had an uncomplicated hospital stay.  He was advanced to a regular diet.  His dressing was removed on  postoperative day #2, as was his drain.  His urine cleared.  He remained  afebrile and began having bowel movements.  By postoperative day #3 he was  felt ready for discharge.  He was discharged home in stable condition.   MEDICATIONS:  1. Vicodin 1-2 p.o. q.4h. p.r.n. pain.  2. Colace once or twice a day to soften his stool.  3. Cipro.  He is to take this a day prior to his catheter removal.  4. He was also discharged home on Ditropan 1 every eight hours as needed for     bladder spasms.                                               Bertram Millard. Dahlstedt, M.D.    SMD/MEDQ  D:  07/29/2003  T:  07/29/2003  Job:  [id number]   cc:   Titus Dubin. Alwyn Ren, M.D. [facility]

## 2011-02-04 NOTE — Cardiovascular Report (Signed)
[name], EUSTICE NO.:  [id number]   MEDICAL RECORD NO.:  [id number]          PATIENT TYPE:  INP   LOCATION:  3739                         FACILITY:  [facility]   PHYSICIAN:  Charlton Haws, M.D.     DATE OF BIRTH:  11-13-1935   DATE OF PROCEDURE:  09/16/2004  DATE OF DISCHARGE:                              CARDIAC CATHETERIZATION   INDICATIONS FOR PROCEDURE:  [name] is a 75 year old patient admitted  with new onset A-fib and heart failure.  He was found to have severe MR with  flail leaflet by echo and TEE.  Catheterization was done to help with  surgical decision making.   Catheterization was done with 6 French arterial catheter in the right  femoral artery and an 8 French venous catheter in the right femoral vein.   The left main coronary artery was normal.   The left anterior descending artery was normal.   The first and second diagonal branch was normal.   The circumflex coronary artery was nondominant and normal.   The right coronary artery was large and somewhat tortuous.  There was a 40%  mid vessel lesion at the take off of the RV branch.   Right heart catheterization.  Mean right atrial pressure was 12.  RV  pressure 30/10.  PA pressure 31/20.  Pulmonary capillary wedge pressure was  22 with a significant V-wave.   Aortic pressure was 106/88.  LV pressure was 106/15.  Cardiac output was 1.2  liters per minute with a cardiac index of 2 liters per minute per meter  square.  The patient was in A-fib at the time of the study with rates  varying from 80 to 110.   IMPRESSION:  The patient has severe MR with a flail middle scallop to the  posterior leaflet by TEE.  He also has new onset atrial fibrillation and  congestive heart failure.  Given the natural history of a flail segment and  the long  term sequelae of atrial fibrillation, I suspect that the patient will  proceed to mitral valve repair plus/minus MAZE procedure sooner rather than  later.   I will discuss these findings with Dr. Jens Som.   The patient tolerated the procedure well.       PN/MEDQ  D:  09/16/2004  T:  09/16/2004  Job:  [id number]   cc:   Titus Dubin. Alwyn Ren, M.D. [facility]   Olga Millers, M.D. [facility]

## 2011-02-04 NOTE — H&P (Signed)
NAMEREINHARDT, LICAUSI NO.:  [id number]   MEDICAL RECORD NO.:  [id number]          PATIENT TYPE:  INP   LOCATION:  3739                         FACILITY:  [facility]   PHYSICIAN:  Titus Dubin. Alwyn Ren, M.D. [facility] OF BIRTH:  May 28, 1936   DATE OF ADMISSION:  09/14/2004  DATE OF DISCHARGE:                                HISTORY & PHYSICAL   HISTORY OF PRESENT ILLNESS:  [name] is a 75 year old white male  admitted with symptomatic atrial fibrillation of new onset.   He states that he has intermittently had shortness of breath but has been  progressively more severe in the last 2-3 days prior to the office visit.  He describes constant shortness of breath for 2-3 days to the point he  could not bend over to tie his shoes.  He has noted pulses vary from 76-96  and marked blood pressure variations from 136-187 with diastolics from 80-  121.  He now is short of breath after walking 10 steps.   Significantly, he was hospitalized November 2003 with PSVT.  He states that  he was told that his heart should be zapped if it came back.   He drinks 2-3 cups of coffee per day and 2 teas per day but denies any other  stimulants.  There is no history of thyroid disorder.   PAST MEDICAL HISTORY:  Long and complicated.  He was treated for anxiety at  age 79.  He had an outpatient herniorrhaphy in 2001 and inguinal  herniorrhaphy and umbilical herniorrhaphy.  Cataract surgery was performed  in January 2004 colonoscopy was negative in 2004.  He underwent left  superficial parotidectomy for Warthin's tumor in April 2004 at [facility]  by Dr. Renata Caprice.  He had a prostate biopsy which revealed adenocarcinoma in  2004.  Dr. Narda Bonds performed the prostatectomy and continues to follow the  patient.  He was found to have stage T2-T3 adenocarcinoma with a Gleason  score 7/10.  He was on Prostaglandin for erectile dysfunction.  He has a  history of chronic obstructive lung disease and is a  former smoker up to  three packs per day having quit in March 2004.   He has also been note to have adrenal adenoma in 2000.   FAMILY HISTORY:  Positive for coronary artery disease and stroke.  Myocardial infarction in his father.  Mother had cancer of the spine.  Paternal grandfather had stroke at 24.  Maternal grandmother had cancer of  the intestines.  Five paternal uncles had coronary disease and two maternal  uncles had coronary disease.  Existing alcoholism is some.  He drinks  socially up to a fifth per month.   ALLERGIES:  No known drug allergies.   MEDICATIONS:  Vytorin 10/20 for approximately one month.   REVIEW OF SYSTEMS:  Poison oak the week prior to the office visit and  treated with Benadryl over the counter with some improvement.  He denies any  chest pain or palpitations with the shortness of breath.   PHYSICAL EXAMINATION:  VITAL SIGNS:  Weight is up 9 pounds to  210.  Temperature 97.2, pulse 88 and irregular, respirations 19 and 25, blood  pressure 130/90.  Arterial narrowing was noted.  HEENT:  He has complete dentures.  NECK:  There is an opulent scar over the left neck below the ear.  Thyroid  is normal to palpation.  LUNGS:  Minimal rales were noted, but there was no  increased work of breathing.  CARDIOVASCULAR:  He has a grade 1 systolic murmur.  Heart rate was  irregular.  All pulses were intact.  No bruits.  EXTREMITIES:  Homans sign was negative.  No peripheral edema.  ABDOMEN:  A ventral hernia is present.  He has no organomegaly or  lymphadenopathy.  MUSCULOSKELETAL:  Unremarkable.  Deep tendon reflexes 1/2+ at the knees.  NEUROLOGICAL:  No localizing neuropsychiatric deficits.   STUDIES:  EKG reveals atrial fibrillation with controlled ventricular rate.  O2 saturations were 91% on room air.  He now has symptomatic atrial  fibrillation which apparently represents recurrence.  He may have been  having intermittent paroxysmal atrial fibrillation with  increased risk for  strokes.  He will be admitted to telemetry with anticoagulation.  Cardiology  consultation will be pursued.  New cardiac enzymes will be collected.       WFH/MEDQ  D:  09/15/2004  T:  09/15/2004  Job:  [id number]   cc:   Bertram Millard. Dahlstedt, M.D.  [address]  [place]  [place] [address]  Fax: [phone]   Vania Rea. Jarold Motto, M.D. [facility]

## 2011-02-04 NOTE — Op Note (Signed)
NAME:  [name], CONSALVO NO.:  [id number]   MEDICAL RECORD NO.:  [id number]                   PATIENT TYPE:  INP   LOCATION:  X008                                 FACILITY:  [facility]   PHYSICIAN:  Bertram Millard. Dahlstedt, M.D.          DATE OF BIRTH:  1935-11-11   DATE OF PROCEDURE:  07/14/2003  DATE OF DISCHARGE:                                 OPERATIVE REPORT   PREOPERATIVE DIAGNOSIS:  Adenocarcinoma of the prostate.   POSTOPERATIVE DIAGNOSIS:  Adenocarcinoma of the prostate.   PROCEDURE:  Radical retropubic prostatectomy with bilateral lymph node  dissection.   SURGEON:  Bertram Millard. Retta Diones, M.D.   ASSISTANT:  Susanne Borders, MD   ANESTHESIA:  General endotracheal anesthesia.   DRAINS:  A 20 French  Foley catheter to straight drain.   PATHOLOGY:  1. Bilateral pelvic lymph nodes.  2. Prostate.   ESTIMATED BLOOD LOSS:  800 mL.   COMPLICATIONS:  None.   DISPOSITION:  To the post anesthesia care unit in stable condition.   INDICATIONS FOR PROCEDURE:  [name] is a 75 year old gentleman who was  referred to Dr. Retta Diones for elevated prostate specific antigen. His  preoperative PSA was 5.61 with 19.3% free. This prompted a transrectal  ultrasound guided biopsy of the prostate. The prostate biopsy demonstrated  Gleason 4+3=7 adenocarcinoma bilaterally. After the  positive diagnosis of  prostate cancer was made, various treatment options were discussed with the  patient including watchful waiting, radiation therapy and prostatectomy. The  patient decided to undergo radical retropubic prostatectomy after  understanding the  risks, benefits and alternatives.   DESCRIPTION OF PROCEDURE:  The patient was brought to the operating room and  correctly identified  by his identification bracelet. He was preoperative  antibiotics and general endotracheal anesthesia. He was placed in the supine  position and shaved, prepped and draped in the typical  sterile fashion.   An infraumbilical midline incision with the scalpel was made in the skin.  Bovie electrocautery was used to incise the Campers and Scarpa's fascia  down to the rectus sheath. The decussating fibers were identified  in the  midline and Bovie electrocautery was used to incise the rectus sheath in the  midline. This revealed  the transversalis fascia between  the 2 valleys of  the rectus abdominus muscle. These were incised with Bovie electrocautery  exposing the space of Retzius. This space was bluntly defined with the  surgeon's hand  bilaterally.   The Bookwalter retractor was then placed and the blades positioned such that  bilateral lymph node dissections could be done. The tissue overlying the  iliac vein was incised with Metzenbaum scissors bilaterally. The vein was  retracted anteriorly and the vein was completely skeletonized of its  lymphatic tissue. Bilaterally the obturator vessels and nerve were  identified  and spared. Clips were placed on the lymph node packet  proximally  and distally for the  lymph node packets being incised. They were  both sent to pathology.   Next  the bladder was retracted cephalad with the Bookwalter retractor and  the fatty tissue overlying the prostatic apex and the endopelvic fascia was  cleared off of the fascia with a sucker and a sponge stick. Bilaterally  small incisions were made in the endopelvic fascia with Metzenbaum scissors.  This was carried both cephalad and caudally to the apex of the prostate.   The superficial branch of  the dorsal venous complex was incised with the  Bovie electrocautery. The puboprostatic ligaments could then be identified  and these were incised with the Metzenbaum scissors bilaterally  with  extreme care taken not to injure the dorsal venous complex. This released  the apex of the prostate from the pubis such that the surgeon's finger could  be placed around the apex. The groove between the  posterior dorsal venous  complex and the anterior urethra could be established with the surgeon's  forefinger and thumb. A Hoenfelter right-angle was placed in this groove and  the dorsal venous complex was surrounded with the right-angle.   A #1 Vicryl tie was placed on the Hoenfelter and this was tied down  distally. The Hoenfelter was passed once again and another #1 Vicryl tie was  used to tie the dorsal venous complex. The Stamey retractor was used to  retract the bladder cephalad and a backbleeding stitch using a #1 Vicryl  suture ligature was placed on the dorsal venous complex proximally  in a  figure-of-8 fashion. Again the Hoenfelter was passed around the dorsal  venous complex and the back end of a #1 Vicryl suture ligature was grasped  and tied down and then the suture was passed in the middle of the dorsal  venous complex and tied down. The Hoenfelter was placed once again beneath  the dorsal venous complex and incised between back bleeding stitch and the 2  ties and suture ligature. This produced only minimal backbleeding from the  prostate which was controlled with a Bovie.   The patient had a very nice urethral stump which could be identified  at  this point. Lateral fibers around the urethra were separated from it using a  right-angle and Metzenbaum scissors. The right angle could then be passed  beneath the urethra and a piece of umbilical tape was passed around the  urethra. The urethra was then incised anteriorly  with a long-handle #15  blade knife.  The right-angle was passed around the Foley catheter and the  Foley catheter was cut at the valve stem and pulled into the field after  careful lubrication. A Tresa Endo was replaced on the Foley catheter and  Metzenbaum scissors  were used to incise the posterior  urethral wall. This  revealed some lateral attachments to the rectourethralis musculature. This was quite abundant in this patient and careful dissection using  right-angle  and Metzenbaum scissors was used to free the posterior  prostate from these  fibers.   The surgeon's finger could then be passed clearly between  the prostatic  capsule and Denonvilliers' fascia in the correct plane. The lateral pedicles  of the prostate were then identified  and taken down  bilaterally  using  a  combination of right-angle to secure the bundle in placing right-angle  hemolock clips and incising with either  Metzenbaums or electrocautery. Care  was taken to avoid injury to the neurovascular bundle on both sides and  these were swept  away from the  urethra at the beginning of the urethral  dissection.  There was some bleeding during takedown of the lateral  pedicles  bilaterally. Once the lateral pedicles were taken down such that similar  vessels could be seen posteriorly, the fascia overlying the seminal vesicles  was scored with the Bovie electrocautery and dissected away from the seminal  vesicles. Attention was then turned to the bladder neck.   Allis clamps were placed above and below the bladder neck and a tonsil was  used to carefully dissect the detrusor musculature away from the  base of  the prostate. The fibers were incised with the  Bovie electrocautery. This  was done bilaterally, freeing the base of the prostate from the bladder. At  this point the only attachments were the vas deferens and the seminal  vesicles. Bilaterally the vas deferens were dissected freely with the right-  angle away from the prostate and were clipped. with hemolock  clips  and  cut. The seminal vesicles were dissected distally as much as possible and a  right-angle clamp was placed on them and they were tied with 2-0 silk  ligatures and incised. The prostate was then freed and passed off the  bladder.   There was some minimal bleeding around the bladder neck posteriorly, and  this was controlled with Bovie electrocautery. There was no bleeding from  the dorsal  venous complex. The bladder mucosa was then carefully everted  with approximately six 4-0 chromic sutures. There was no bladder neck  reconstruction required  as the defect was about the size of 2 surgeon's  fingers.   The Greenwald urethral sound was then placed in the urethra after careful  lubrication for passage of the anastomotic sutures. These were placed in an  outside-to-in fashion. Four sutures were placed at 2, 5, 7 and 11 o'clock.  The sutures were then passed inside out and the respective positions in the  bladder neck. The bladder was then released from traction and the sutures  were tied down. The urethral catheter was passed to the urethra and into the  bladder and filled with 10 mL prior to placement  of the anastomotic sutures  at the bladder neck. The sutures were tied down carefully. The catheter was  irrigated with approximately 150 mL of sterile saline and no leakage was  seen from the anastomosis.  The entire wound was then copious irrigated with antibiotic solution.  Careful inspection for further bleeding revealed none. A #10 Blake drain was  placed in the left lower quadrant by making a small stab incision, passing  it tonsil to the body wall and pulling the drain through. The drain was sewn  in place with a 3-0 silk suture. The rectus sheath was closed in the a #1  PDS in a running fashion. The skin was copiously irrigated and the skin was  closed with a running 4-0 Monocryl.   The patient tolerated the procedure well without complications. Please note  that Dr. Retta Diones was present and participated in all aspects of this case  as he was the primary surgeon.     Susanne Borders, MD                           Bertram Millard. Dahlstedt, M.D.    DR/MEDQ  D:  07/14/2003  T:  07/14/2003  Job:  [id number]

## 2011-02-04 NOTE — Consult Note (Signed)
NAMECAMILLE, [name] NO.:  [id number]   MEDICAL RECORD NO.:  [id number]          [name] TYPE:  INP   LOCATION:  2311                         FACILITY:  [facility]   PHYSICIAN:  Samul Dada, M.D.DATE OF BIRTH:  [birth year]   DATE OF CONSULTATION:  02/15/2005  DATE OF DISCHARGE:                                   CONSULTATION   REASON FOR CONSULTATION:  Left pulmonary embolism.   REFERRING PHYSICIAN:  Rene Paci, M.D.   HISTORY OF PRESENT ILLNESS:  [name] is an unfortunate 75 year old male  with a history of chronic atrial fibrillation-mitral valve replacement, on  chronic Coumadin admitted on Feb 13, 2005 with progressive back pain and  rigors, for which he was evaluated by neurology, and a MRI of Shaun lumbar  spine showed L4 to L5 diskitis, DJD of 3 to 4, 4, and 5 to 6. Shaun pain  continued, he became short of breath, and began to experience left sided  chest pain, for which a CT angio was ordered on Feb 15, 2005. This showed  acute thromboembolism of Shaun left lower lobe, bilateral pleural effusion,  mediastinal-hilar lymphadenopathy, and inflammatory versus neoplastic  etiology of lymphadenopathy. Left adrenal nodule measuring 2.7 cm was seen  as well as a right thyroid nodule requiring further workup. As for Shaun MRI  of Shaun brain, there was a question of a lacunar infarct, and a 2.3 x 1.6 cm  soft tissue lesion at Shaun right parietal-adjacent calvarium area. While  awaiting for interventional radiology to evaluate diskitis of Shaun L4-L5, Shaun  [name]'s Coumadin had been discontinued for Shaun procedure. INR at Shaun time  was 2.7 on admission, 2.8 on May 29, and today it is 2.6. [name] became  dyspneic shortly after this, heparin was initiated per pharmacy, and a stat  CT was performed revealing PE despite therapeutic INR. We were asked to see  [name] for further evaluation and recommendations.   PAST MEDICAL HISTORY:  1.  Left PE with  subsequent acute hypoxic respiratory distress.  2.  Mitral valve regurgitation in Shaun past, status post MVR on chronic      Coumadin.  3.  History of prostate cancer, adenocarcinoma, T2, T3, Gleason 7 of 10,      followed by Dr. Retta Diones.  4.  Hypercholesterolemia.  5.  Hypertension.  6.  Prior history of tobacco abuse.  7.  Previous history of Wharton's tumor, followed by Dr. Renata Caprice at [facility].  8.  COPD.  9.  History of adrenal adenoma.  10. DJD.  11. Anxiety.  12. Erectile dysfunction.   PAST SURGICAL HISTORY:  1.  Status post MVR September 22, 2004, Dr. Barry Dienes.  2.  Status post prostatectomy.  3.  Status post left parotidectomy in 2004.  4.  Status post right inguinal hernia repair.  5.  Status post right cataract surgery on September 20, 2002.  6.  Status post TEE in January 2006.   ALLERGIES:  No known drug allergies.   CURRENT MEDICATIONS:  1.  Heparin per pharmacy since  Feb 15, 2005.  2.  Toradol 30 mg IV q.6h.  3.  Toprol 25 mg p.o. q.d.  4.  Xanax 0.5 mg b.i.d. p.r.n.  5.  Dilaudid 2 mg q.3h. p.r.n.  6.  Senokot q.h.s. p.r.n.   REVIEW OF SYSTEMS:  Remarkable for dyspnea on exertion and left sided chest  pain. Of note, he has been experiencing intermittent double vision for Shaun  last three to four months, with MRI results as mentioned in Shaun HPI. Shaun  rest of Shaun review of systems is essentially negative. He is very weak,  unable to walk for Shaun last two days due to pain. No prior history of blood  clots.   FAMILY HISTORY:  Mother died with cancer of Shaun spine as mentioned by Shaun  [name]. Father died of MI at 65. One sister in good health. One brother in  good health. Maternal grandmother with colon cancer. No family history of  blood clots.   SOCIAL HISTORY:  [name] is married to his Marylu Lund. Two children in good  health. Retired [employer]. He lives in [place]. He quit in 2003 Shaun  use of three packs a day of cigarettes for about 50  years or more. His last  colonoscopy in 2004 for routine.   PHYSICAL EXAMINATION:  GENERAL:  This is a 75 year old white male slightly  confused.  VITAL SIGNS:  Blood pressure 105/67, pulse 105, respirations 22, temperature  97.7. Pulse oximetry 95% on room air. Weight 199, height 70 inches.  HEENT:  Normocephalic, atraumatic at this time. PERRLA. EOMI. Mouth without  thrush or lesions. He has full dentures.  NECK:  Supple with no cervical or supraclavicular masses.  LUNGS:  Positive for wheezing and bilateral rales more pronounced on Shaun  left. No axillary masses.  CARDIOVASCULAR:  Regular rate and rhythm with no murmurs, rubs, or gallops.  ABDOMEN:  Distended, diffusely tender on light palpation. Decreased bowel  sounds throughout. No palpable spleen or liver.  GENITOURINARY/RECTAL:  Deferred.  EXTREMITIES:  No clubbing or cyanosis. No edema.  SKIN:  Without lesions, bruising, or petechiae.  NEUROLOGICAL:  Nonfocal.   LABORATORY DATA:  Hemoglobin 12.8, hematocrit 38.9, white count 8.4,  platelets 125, neutrophils 5.6, MCV 85.2. PT 22.8, INR 2.8. ESR 52. Sodium  133, potassium 3.7, BUN 14, creatinine 1.1, glucose 109. Blood cultures  positive for gram positive cocci. UA pending. BNP 106.   ASSESSMENT/PLAN:  Dr. Arline Asp has seen and evaluated this [name], and Shaun  chart has been reviewed. Confusing case. CT of Shaun chest reviewed with Dr.  ________________ shows embolism in Shaun left lower lobe which is relatively  subtle and of questionable clinical significance, compared with extensive  right upper lobe and right lower lobe pneumonia. Shaun age of Shaun embolism is  also not clear. Could be acute or days to weeks old. Dr. Arline Asp is not  concerned about Shaun significance of Shaun hilar and Shaun mediastinal adenopathy  in Shaun report. Could be related to [name]'s pneumonia. [name] will need followup CT scan after resolution of Shaun acute process. [name]  currently is on  antibiotics, consisting of Rocephin and vancomycin, and he  is also IV heparin infusion. Venous  Dopplers negative. Will check Shaun legs for DVT with CT. Will check for  hypercoagulable state. Will also check his PSA. Of note, his left adrenal  mass dates back to November 2000, in consequence is likely to be adenoma.  Thank you very much for allowing [place]  Shaun opportunity to participate in Shaun  care of Mr. Devine.       SW/MEDQ  D:  02/18/2005  T:  02/18/2005  Job:  [id number]   cc:   Genene Churn. Love, M.D.  [address]  [address]  [place]  [place] [address]  Fax: [phone]   Dagoberto Ligas, M.D.

## 2011-02-04 NOTE — Op Note (Signed)
NAMECHAUNCY, MANGIARACINA NO.:  [id number]   MEDICAL RECORD NO.:  [id number]          PATIENT TYPE:  INP   LOCATION:  2309                         FACILITY:  [facility]   PHYSICIAN:  Kaylyn Layer. Michelle Piper, M.D.   DATE OF BIRTH:  October 16, 1935   DATE OF PROCEDURE:  09/14/2005  DATE OF DISCHARGE:                                 OPERATIVE REPORT   PROCEDURE:  Transesophageal echo probe insertion and monitoring during  mitral valve repair surgery.   BODY OF REPORT:  I was consulted by Dr. Tressie Stalker to place the  transesophageal echo probe in Mr. Duplessis for monitoring during mitral valve  repair surgery for chronic endocarditis.  Mr. Hosea is a 75 year old  gentleman who underwent a mitral valve repair at the beginning of 2006 and  has developed possible vegetations on his mitral valve.  He has had  neurologic complications from these vegetations and has failed conservative  therapy and presents today for operative intervention.  After uneventful  induction of anesthesia and endotracheal intubation, transesophageal echo  probe was easily placed in the patient's stomach.  Initial short axis view  of the left ventricle showed normal left ventricle without any wall motion  abnormalities.  Turning to the mitral valve, the mitral valve ring could be  seen clearly with echogenic shadows.  The mitral valve leaflets coapted  normally.  On placing color flow Doppler across the valve, there was no  mitral regurgitation in any of the segments.  A mass could be seen about the  anterior leaflet of the mitral valve which seemed to be tethered to the  mitral valve ring.  It was free, mobile, and on placing color flow Doppler  across the anterior leaflet of the mitral valve, a vertical diastolic jet  could be seen heading into the aortic outflow tract.  This was brought to  the attention of Dr. Cornelius Moras and he inspected and did not find any defect in  the mitral valve during surgery.  The left atrium  was normal.  Flow in the  pulmonary veins was full.  The intra-atrial septum was normal.  The aortic  valve was tricuspid in longitudinal view, color flow Doppler was placed  across it and there was a trace of aortic insufficiency, there were no signs  of any vegetation on the pulmonary valve leaflet.  The right side of the  heart was within normal limits.   The patient was placed on cardiopulmonary bypass by Dr. Cornelius Moras and underwent a  mitral valve repair.  The ring was removed and the vegetations were excised  and a new ring placed.  Cardiopulmonary bypass was discontinued uneventfully  with minimal inotropes and the patient did well after the bypass run.  At  this point, evaluation showed that the left ventricle was unchanged, there  were no wall motion abnormalities.  On the mitral valve structurally, a ring  could be seen in place and the valve was competent.  The valve coapted and  there was no mitral regurgitation.  The aortic valve was unchanged.  The  intra-atrial septum on the  right side of the heart was unchanged.  The left  atrium was unchanged.  At the end of the procedure, the transesophageal echo  probe was removed uneventfully and the patient was taken to the intensive  care unit in stable condition.           ______________________________  Kaylyn Layer Michelle Piper, M.D.     KDO/MEDQ  D:  09/14/2005  T:  09/14/2005  Job:  [id number]

## 2011-02-04 NOTE — H&P (Signed)
NAME:  [name], [name] NO.:  [id number]   MEDICAL RECORD NO.:  [id number]                   PATIENT TYPE:  INP   LOCATION:  X008                                 FACILITY:  [facility]   PHYSICIAN:  Bertram Millard. Dahlstedt, M.D.          DATE OF BIRTH:  16-Jan-1936   DATE OF ADMISSION:  07/14/2003  DATE OF DISCHARGE:                                HISTORY & PHYSICAL   REASON FOR ADMISSION:  Prostate cancer.   BRIEF HISTORY:  This nice 75 year old gentleman has prostate cancer  diagnosed upon biopsy for an elevated PSA.  The patient recently had a PSA  which was 5.61, up slightly over the past year.  Free to total ratio is  19.3%.  He underwent ultrasound and biopsy which revealed his prostate to be  approximately 45 mL in size.  Biopsy revealed Gleason 7/10 adenocarcinoma  bilaterally involving 10% of each side biopsies.   He has been counseled on treatment options including watchful waiting,  surgical removal, and radiation of the prostate.  Risks and complications of  all of these were discussed with the patient.  He desires to go with  surgical removal.  He is admitted for that purpose at the present time.   PAST MEDICAL HISTORY:  Coronary artery disease.  His exercise tolerance test  recently was normal.  He did have hypokinesis of the inferior base at rest.  Ejection fraction was greater than 60%.  He has no other medical illnesses.   PAST SURGICAL HISTORY:  He is status post excision of a benign tumor of his  left submandibular gland at [facility].  He has a history of hernia repair  by Anselm Pancoast. Zachery Dakins, M.D. three years ago and a hemorrhoidectomy.   ALLERGIES:  The patient denies any drug allergies.   SOCIAL HISTORY:  The patient is married and has two children.  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  He recently quit smoking.  He  drinks about a pint of hard liquor on a monthly basis.   FAMILY HISTORY:  Significant for cancer and  stroke.   REVIEW OF SYSTEMS:  The patient has a fair stream.  He has no urgency or  frequency.  He does have problems obtaining and maintaining erections.  He  has no curvature or pain with erections.   PHYSICAL EXAMINATION:  GENERAL:  Very pleasant, healthy appearing adult  male.  VITAL SIGNS:  Normal.  HEENT:  Normal.  NECK:  Supple without thyromegaly or adenopathy.  CHEST:  Clear.  HEART:  Regular rate and rhythm.  ABDOMEN:  Unremarkable with the exception of healed hernia incision without  recurrent hernia.  GENITOURINARY:  Phallus was uncircumcised.  Testicles were normal.  SKIN:  Normal without phimosis.  RECTAL:  There was normal anal sphincter tone.  Glan was 40 g in size  without tenderness or nodularity.  There were no rectal masses.  EXTREMITIES:  Without clubbing, cyanosis, edema.  NEUROLOGIC:  Grossly intact.   LABORATORIES:  Normal with hematocrit 46%.  Chest x-ray normal.   IMPRESSION:  1. Prostate cancer for radical prostatectomy.  2. Erectile dysfunction, moderate.  The patient on Viagra.  3. History of herniorrhaphy.   PLAN:  Admit for radical prostatectomy.                                               Bertram Millard. Dahlstedt, M.D.    SMD/MEDQ  D:  07/14/2003  T:  07/14/2003  Job:  [id number]   cc:   Titus Dubin. Alwyn Ren, M.D. [facility]

## 2011-02-04 NOTE — Consult Note (Signed)
[name], FEATHERSTON NO.:  [id number]   MEDICAL RECORD NO.:  [id number]          PATIENT TYPE:  INP   LOCATION:                               FACILITY:  [facility]   PHYSICIAN:  Olga Millers, M.D. LHCDATE OF BIRTH:  1936/07/06   DATE OF CONSULTATION:  DATE OF DISCHARGE:                                   CONSULTATION   [name] is a gentleman well known to me.  He is 75 years old and has a  history of mitral valve repair/Maze procedure in January of 2006, atrial  fibrillation, prostate cancer, COPD, who I am asked to evaluate for a mobile  density noted on transesophageal echocardiogram (question subacute bacterial  endocarditis versus thrombus).  The patient has a recent extremely  complicated course.  He was seen by Dr. Cornelius Moras in followup from his mitral  valve surgery in April and complained of diplopia at that time.  An MRI  showed no acute ischemia of the brain.  He was seen by me in the office  recently and was doing well with no complaints of dyspnea, chest pain,  palpitations or syncope; however, on Feb 12, 2005, he was admitted with  acute onset of right lower back pain.  It should be noted that his initial  white blood cell count was 10.7.  His INR was 2.7 with a sed rate of 52.  His initial urinalysis was negative.  It was felt that he may have possible  diskitis.  He was treated with antibiotics.  His cultures have been  negative, except for one blood culture demonstrating coag negative Staph.  The patient's course was complicated by the sudden onset of shortness of  breath requiring intubation.  A chest CT revealed a question pulmonary  embolus in the left lower lobe, as well as bilateral air space disease and  effusions.  He also has had an adrenal nodule and a thyroid nodule.  He was  treated for pneumonia with antibiotics and also diuresed and he subsequently  improved and was extubated.  A transthoracic echocardiogram was technically  difficult, but  revealed normal LV function, mild mitral stenosis, mild right  atrial and right ventricular enlargement and increased pulmonary pressures.  Also of note, a thoracentesis revealed a transudate of pleural effusion.  The patient subsequently had a TEE yesterday due to his bacteremia by Dr.  Gala Romney.  He was found to have normal LV function, left atrial  enlargement, status post mitral valve repair and there was an oscillating  density associated with a mitral valve annulus.  There was trivial mitral  regurgitation.  Cardiology is now asked to evaluate.   Of note, the patient was not complaining of dyspnea, chest pain, fevers or  chills or productive cough and no dysuria prior to admission.  He had no  recent dental procedure.   CURRENT MEDICATIONS:  1.  Coumadin as directed.  2.  Cardizem 30 mg p.o. q.6.  3.  Insulin.  4.  Albuterol.  5.  Atrovent.  6.  Digoxin 0.125 mg p.o. daily.  7.  Enoxaparin 90  mg subcutaneous q.12.  8.  Zosyn.  9.  Zithromax.  10. Vancomycin.  11. Xanax.   PAST MEDICAL HISTORY:  1.  History of mitral valve repair and Maze procedure in January 2006 as      described above.  2.  History of hypertension.  3.  History of hyperlipidemia.  4.  History of prostate cancer and is status post radical prostatectomy.  5.  COPD.  6.  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  7.  He has had a previous repair of right inguinal hernia and umbilical      hernia.  8.  He has had cataract extraction.   SOCIAL HISTORY:  He is married and lives with his wife.  He has a long  history of tobacco use, but he quit approximately two years ago.  There was  no alcohol use.   FAMILY HISTORY:  Significant for coronary artery disease in his father.   REVIEW OF SYSTEMS:  He denies any headaches, fevers or chills.  He did have  some shaking at the time of his admission when he had back pain by his  report, but  he is not clear that this is chills.  There is no cough at  present and he has not had hemoptysis.  He denies any dysphagia,  odynophagia, melena, hematochezia.  There was no dysuria or hematuria.  There is no seizure activity.  There is no orthopnea, PND or pedal edema.  He does feel weak now from his complicated course.  The remaining systems  are negative.   PHYSICAL EXAMINATION:  VITAL SIGNS:  His physical exam today shows that he  is afebrile.  His blood pressure is 130/70 and his pulse is 70.  He is on  50% face mask and is 95% saturated.  GENERAL:  He is well developed and somewhat weak appearing.  He is in no  acute distress at present.  SKIN:  Warm and dry.  There is no peripheral stigmata of SBE.  HEENT:  Unremarkable with normal eyelids.  NECK:  Supple with __________ bilaterally and I cannot appreciate bruits.  There is no jugular venous distention.  His thyroid appears to be normal.  CHEST:  Shows diminished breath sounds predominantly in the left lower lobe.  Is otherwise clear to auscultation with normal expansion.  CARDIOVASCULAR:  Reveals a regular rate and rhythm with normal S1 and S2.  There are no murmurs, rubs or gallops noted.  His PMI is not displaced.  His  sternotomy is without evidence of infection.  ABDOMEN:  Nontender, nondistended, positive bowel sounds.  No  hepatosplenomegaly.  No masses appreciated.  There is no abdominal bruit.  He does have evidence of a previous scar from his prostatectomy.  He has 2+  femoral pulses bilaterally.  There are no bruits noted.  He does have what  appears to be a Candida rash in his groin.  EXTREMITIES:  Show no edema and I can palpate no cords.  He has 2+ dorsalis  pedis pulses bilaterally.  NEUROLOGIC:  Grossly intact, although he is somewhat diffusely weak from his  recent hospitalization.   LABORATORY DATA:  White blood cell count of 10.4 with a hemoglobin of 9.6 and hematocrit of 28.6.  His platelet count is 382.   His INR today is 2.0.  His sodium is 147 with a potassium of 4.2.  His BUN  and creatinine are 35  and 2.0.  His albumin is 2.1 most recently.  His culture results are as  described in the HPI.  His electrocardiogram from February 17, 2005 showed atrial  fibrillation with a rapid ventricular response at 119.  There were  nonspecific ST changes noted.   DIAGNOSES:  1.  Mobile density noted on mitral valve (thrombus versus vegetation).  2.  Status post mitral valve repair and Maze procedure in January 2006.  3.  Recurrent atrial fibrillation.  4.  History of chronic obstructive pulmonary disease.  5.  Mild renal insufficiency.  6.  History of prostate cancer.  7.  Question pneumonia.   PLAN:  Mr. Hardage has been admitted with an extremely complicated course.  A  transesophageal echocardiogram yesterday showed a mitral valve density and  the etiology is unclear to me.  The most likely possibilities are thrombus  versus vegetation.  Thrombus seems to be the most likely, but it would be  unusual as the patient has been on Coumadin since his surgery and is now  five months postoperative.  His course is also not classic for subacute  bacterial endocarditis as there was no preceding fevers and there is no  significant mitral regurgitation on transesophageal echocardiogram (with the  size of the density it would seem that there would be some disruption of the  mitral valve apparatus from infection if this was indeed the case); however,  SBE does remain a possibility.  Regardless, the mobile density is extremely  concerning in particular in light of his recent diplopia (question  __________ ).  It is a large density.  Dr. Cornelius Moras feels that he is presently  not a good candidate for reoperation. We would therefore continue with  Coumadin and given the possibility of thrombus, our goal INR should be  approximately 3.  It should be noted that patient was therapeutic when he  was admitted.  We also may need  to add aspirin 81 mg p.o. daily for full  anticoagulation effects.  I also feel that he will most likely need to be  treated for SBE, although I am not convinced this is a definitive diagnosis  . We did have one blood culture positive for coag negative Staph.  This  certainly could be a contaminant, but with the mobile density on his mitral  valve, it would be reasonable to treat for endocarditis.  I would like an  opinion from the infectious disease physicians concerning this issue.  We  then could plan to repeat his transesophageal echocardiogram in six weeks,  as well as repeat blood cultures after his course of antibiotics have been  complete.  If the density persists, then we might need to consider the risks  and benefits of repeat surgery.           ______________________________  Olga Millers, M.D. [facility]     BC/MEDQ  D:  02/23/2005  T:  02/23/2005  Job:  [id number]

## 2011-02-04 NOTE — Op Note (Signed)
NAMELAVONNE, CASS NO.:  [id number]   MEDICAL RECORD NO.:  [id number]          PATIENT TYPE:  INP   LOCATION:  3739                         FACILITY:  [facility]   PHYSICIAN:  Charlton Haws, M.D.     DATE OF BIRTH:  05-16-1936   DATE OF PROCEDURE:  09/16/2004  DATE OF DISCHARGE:                                 OPERATIVE REPORT   ADDENDUM:   PROCEDURE:  Transesophageal echocardiogram.   HISTORY:  Mr. Simonis is a 75 year old patient with known mitral  insufficiency.  Please see previous dictation for his transesophageal  echocardiogram.   FINDINGS:  Quantitation of the patient's MR was attempted using PISA.  However, the patient had an extremely eccentric anterior jet due to flail  segment to the middle scallop of the posterior leaflet.  The PISA radius and  velocity use was 39 cm/sec.  The MR PISA radius was 1.3 cm.  This in of  itself, particularly in the eccentric jet, would indicate severe MR.  Maximum MR velocity was 4.8 cm/sec.  The MR regurgitation and ETI was 125  cm.  The maximum MR flow rate calculated was 414 cc/sec.  These gave a  regurgitant orifice of 0.87 cm sq and regurgitant volume of 190 cc.   Again, given the patient's morphology of a flail middle posterior scallop,  regurgitant orifice and regurgitant volume at this site; the patient's  overall mitral insufficiency would be graded as severe.   This was also verified by catheterization on RAO ventriculography, where the  patient had angiographic grade 4/4 MR with filling of the pulmonary veins.       PN/MEDQ  D:  09/16/2004  T:  09/16/2004  Job:  [id number]

## 2011-02-04 NOTE — Consult Note (Signed)
[name], [name] NO.:  [id number]   MEDICAL RECORD NO.:  [id number]                   PATIENT TYPE:  INP   LOCATION:  4705                                 FACILITY:  [facility]   PHYSICIAN:  Rollene Rotunda, M.D. [facility]            DATE OF BIRTH:  02-Dec-1935   DATE OF CONSULTATION:  08/04/2002  DATE OF DISCHARGE:                                   CONSULTATION   PRIMARY CARE PHYSICIAN:  Dr. Alwyn Ren.   REASON FOR PRESENTATION:  Patient with atrial flutter.   HISTORY OF PRESENT ILLNESS:  The patient is a 75 year old gentleman with no  prior cardiac diagnosis. He was evaluated per his report approximately six  months ago for episodes of shortness of breath that happened a few times  with exertion. 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 He is able to  otherwise be very active. He describes having an echocardiogram and an  exercise Cardiolite in our office (these results are not available). He was  told that they were okay, and there was no cardiac problem.   The patient was doing well working outside as he always does yesterday. He  noticed some mild discomfort in his neck and his head getting hot. He did  not feel his heart racing. He was not presyncopal or syncopal. He came into  the house and hooked himself up to his automatic blood pressure cuff. He  noticed a heart rate to be in the 140s. His son who is an EMT came over and  took several EKGs demonstrating atrial flutter. The patient came to the  hospital and was admitted by primary care service. His EKG was sinus rhythm.  He was in sinus rhythm with occasional wide complex ectopic beats. He did  have one episode of an irregular wide complex arrhythmia with a right bundle  branch block morphology. This was asymptomatic. He has ruled out for  myocardial infarction with negative enzymes x3. He has otherwise been  asymptomatic and very much wants to go home.   In retrospect, the patient denies any exertional chest discomfort, neck  discomfort, arm discomfort, nausea, vomiting, or excessive diaphoresis. He  has never had any palpitations.   PAST MEDICAL HISTORY:  There is no history of diabetes, hypertension, or  hyperlipidemia. He has been told he had mild emphysema.   PAST SURGICAL HISTORY:  Right inguinal hernia repair.   ALLERGIES:  None.   MEDICATIONS:  Pepcid p.r.n.   SOCIAL HISTORY:  He lives in [place] with his wife of 48 years. 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 He has been smoking  something since he was 75 years old and currently smokes two pack per day and  has a 100-pack-year history.   FAMILY HISTORY:  Noncontributory for  early coronary artery disease.   REVIEW OF SYMPTOMS:  As stated in the HPI and positive for occasional  gastroesophageal reflux symptoms, joint pains, and cough. Otherwise negative  for all other symptoms.   PHYSICAL EXAMINATION:  GENERAL:  The patient is in no distress.  VITAL SIGNS:  Blood pressure 123/72, heart rate 65 and regular.  HEENT:  Eyelids unremarkable. Pupils are equal, round, and reactive to  light. Fundi not visualized. Oral mucosa unremarkable.  NECK:  No jugular venous distention. Wave form within normal limits. Carotid  upstroke brisk and symmetric. No bruits. No thyromegaly.  LYMPH:  No cervical, axillary, or inguinal adenopathy.  LUNGS:  Clear to auscultation bilaterally.  BACK:  No costovertebral angle tenderness.  CHEST:  Unremarkable.  HEART:  PMI not displaced or sustained. S1 and S2 within normal limits. No  S3. No S4. No murmurs.  ABDOMEN:  Flat, positive bowel sounds. Normal in frequency and pitch. No  bruits. No rebound, guarding, midline pulsatile masses, hepatomegaly,  splenomegaly.  SKIN:  No rash. No nodules.  EXTREMITIES:  2+ pulses throughout. No edema.  NEUROLOGICAL:  Oriented to person,  place, and time. Cranial nerves II-XII  grossly intact. Motor grossly intact.   LABORATORY DATA:  EKG:  Sinus rhythm, rate 88, RSR prime in V1 and V2, left  axis deviation.  CK-MB and troponin negative x3. WBC 10.4, hemoglobin 16.4, platelets 156,  sodium 138, potassium 4.2, BUN 14, creatinine 1.4. Troponin 3.4.   ASSESSMENT/PLAN:  1. Atrial flutter. The rhythm strips predominantly demonstrated atrial     flutter. There seemed to be a short period of atrial fibrillation. He was     symptomatic with this. 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 Therefore, flutter ablation might be an     excellent option for this gentleman, and I have discussed this with him.     He will be put on Toprol-XL for rate control. He will be started on an     aspirin. I will obtain his outpatient studies (I believe he had a     Cardiolite and an echocardiogram). If these are normal, I will have him     follow with one of our electrophysiologist. The patient was instructed to     come back to the hospital if he has any further sustained symptoms.  2. Tobacco. We had a long discussion about the need to stop smoking. He will     be given a prescription for Wellbutrin.                                               Rollene Rotunda, M.D. [facility]    JH/MEDQ  D:  08/04/2002  T:  08/05/2002  Job:  [id number]   cc:   Titus Dubin. Alwyn Ren, M.D. [facility]

## 2011-02-04 NOTE — Assessment & Plan Note (Signed)
[facility]                        [place] OFFICE NOTE   JERNARD, REIBER                       MRN:          [id number]  DATE:12/18/2006                            DOB:          11/12/1935    Mr. Trebilcock has had productive cough since March 29 with streaky  hemoptysis with dark globs of blood in his sputum. He is producing up to  a half a cup of greenish brown. He has actually has some sputum  production for approximately a week. This has been associated with  shortness of breath particularly when supine. He has had associated  fevers, chills, and sweats.   He denies facial pain, anosmia, nasal purulence, or other signs of  rhinosinusitis.   He had a prothrombin time done on March 28 with an INR of 3.4 at the  Coumadin clinic.   He denies any chest pain, palpitations, or shortness of breath.   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 He had debridement of mitral  valve with removal of vegetations and redo of the mitral valve repair  with an annuloplasty September 14, 2005 by Dr. Barry Dienes.   Additionally he had hypoxic respiratory failure secondary to pneumonia  and probable pulmonary embolus in May of 2006.   He is an ex-smoker.   HE HAS A HISTORY OF INTOLERANCE TO LIPITOR. THERE WAS A QUESTION OF  INTOLERANCE TO CODEINE AND PENICILLIN BUT HE DENIES THIS.   Presently he is on;  1. Vytorin 20/10.  2. Baby aspirin.  3. Metoprolol, in addition to Coumadin.   Weight is up approximately 8 pounds to 212, temperature is 99.1, pulse  is 60 and irregular. Respiratory rate is 18 and unlabored, blood  pressure is 120/70. Pupils were equal, round, and reactive to light;  extraocular motion is intact.   There is nasal septum deviation.   There remainder of the otolaryngologic exam was unremarkable.   He has no lymphadenopathy about the head, neck, or axilla.   Breath sounds  are decreased; he has no increased work of breathing.   Rhythm is slightly irregular with a slight flow murmur.   Homans sign is negative. He has trace to 1 half + edema.   Chest x-rays shows some borderline cardiomegaly and vascular  accentuation. There is suggestion of infiltrate versus atelectasis in  the costophrenic angles. There is suggestion of an infiltrate on the  lateral view. Unfortunately the only films available are from 2003. All  other films are in the hospital system.   He appears to have bronchitis with hemoptysis. PT INR  now is 3.1. No  change will be made in his Coumadin.   He will be placed on Tussionex 5 mL every 12 hours as needed as well as  Augmentin  875 every 12 hours with a meal. We should avoid any of the  antibiotic agents which would prolong his prothrombin time.     Titus Dubin. Alwyn Ren, MD,FACP,FCCP  Electronically Signed    [facility]/MedQ  DD: 12/18/2006  DT: 12/18/2006  Job #: [id number]

## 2011-02-04 NOTE — Op Note (Signed)
[place]  Patient:    [name], [name]                         MRN: [id number] Proc. Date: 11/30/99 Adm. Date:  [id number] Attending:  Henrene Dodge                           Operative Report  PREOPERATIVE DIAGNOSIS:  Right inguinal hernia.  PROCEDURE:  Right inguinal herniorrhaphy; also, repair of small defect umbilicus.  ANESTHESIA:  Local, with sedation, switching to general anesthesia.  SURGEON:  Anselm Pancoast. Zachery Dakins, M.D.  ASSISTANT:  Nurse.  HISTORY:  [name] is a 75 year old Caucasian male who was referred to me for a symptomatic right inguinal hernia.  The patient said that probably about three months ago, I think he was jumping out of a truck or tractor and had a bulge that appeared shortly afterwards.  The patient is a heavy cigarette smoker and has had a cough recently, and the hernia has increased in size.  He also has got a very small fascial defect, about a one-stitch area, at the umbilicus.  He has had no change in bowel movements, no difficulty voiding, and I recommended repair of this with local anesthesia with sedation.  The patient is a heavy cigarette smoker.  PREOPERATIVE MEDICATIONS:  The patient was given a gram of ______ and taken to he operative suite.  The right groin area was clipped and then prepped with Betadine surgical solution and draped in a sterile manner.  The inguinal incision area was infiltrated with a mixture of 0.5% plain Xylocaine and 0.2 ______ with adrenalin, and then the umbilicus area was infiltrated.  I also anesthetized the ilioinguinal nerve, and all totaled, about 35 cc of solution was used.  A sharp dissection was made in through the skin to the subcutaneous.  There was a superficial vein that was clamped and ligated after being divided with ______ and then the external oblique was opened through the external ring.  At this point, the patient started getting kind of  a paroxysmal cough.  He appeared to be anesthetized, but when he would cough, there would be a big protrusion of the indirect hernia sac, and then he would quit coughing, and we had this peritoneum anesthetized and kind of separating the lipoma and the hernia sac from the cord structures.  The patient  just kept having paroxysmal episodes of coughing, and at this point, we elected to go ahead and switch him to an endotracheal tube and general anesthesia.  Then, he hernia sac was separated, and a high sac ligation was formed with #0 Surgilon and then the second one distally and then the hernia sac removed.  I had opened the  hernia sac in doing the high sac ligation.  Next, he basically had a direct hernia, and this was repaired with sort of a modified Shouldice repair with a 2-0 Prolene, and then after this was completely repaired, I used a piece of Prolene mesh, suturing it to the floor.  It was shaped like a sail, split laterally, starting at the symphysis pubis and then running with a 2-0 Prolene on the inferior limb and interrupted sutures on the superior limb.  The two tails were placed another the new created internal ring, and these were sutured together and lateral to the inguinal ligament.  Next, the external oblique was closed with a 3-0  Vicryl, and the ilioinguinal nerve had been placed through the new ring with the cord structures.  Next, the Scarpas fascia was closed with an interrupted 3-0 Vicryl  and 5-0 Dexon subcuticular and benzoin and Steri-Strips on the skin.  Next, I made a low incision right below the umbilicus, identified the little fascia defect, separated the little hernia sac down into the preperitoneal space, and then closed this with about three sutures of #0 Surgilon.  The patient tolerated the procedures nicely.  The subcutaneous wounds were closed with 5-0 Vicryl and benzoin and Steri-Strips on the skin and incision.  The patient will be released  after a short stay in the recovery room. DD:  11/30/99 TD:  11/30/99 Job: [id number]

## 2011-02-04 NOTE — H&P (Signed)
[name], KNISLEY NO.:  [id number]   MEDICAL RECORD NO.:  [id number]          PATIENT TYPE:  INP   LOCATION:                               FACILITY:  [facility]   PHYSICIAN:  Salvatore Decent. Cornelius Moras, M.D. DATE OF BIRTH:  1936-07-27   DATE OF ADMISSION:  DATE OF DISCHARGE:                                HISTORY & PHYSICAL   PRESENTING CHIEF COMPLAINT:  Shortness of breath.   HISTORY OF PRESENT ILLNESS:  Mr. 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 Marga Melnick with known history of mild mitral  regurgitation, PSVT, and recently diagnosed hypertension and hyperlipidemia.  Mr. [name] states that he was first told by Dr. Clearance Coots that he had a heart  murmur several years ago.  He has had echocardiograms performed in the past  demonstrating mild mitral regurgitation. In November 2003, [name]  presented with paroxysmal atrial flutter which was also associated with a  few brief episodes of atrial fibrillation. This terminated spontaneously. He  was evaluated by Dr. Sherryl Manges at that time, but he remained in normal  sinus rhythm and did not require cardioversion or pharmacologic  intervention. He was subsequently told that he would be considered a  candidate for possible ablation therapy if his atrial arrhythmias recurred.   [name] reports that over the last two or three years, he has had a few  episodes of shortness of breath with strenuous physical exertion. This has  only occurred on six or eight total occasions, until recently when Mr.  [name] has developed much more pronounced shortness of breath with exertion.  He does note that over the last few weeks he had several episodes of  paroxysmal nocturnal dyspnea where he woke up from the sleep smothering  and had to get up and get out of bed in order to catch his breath. Over the  last few days he has developed severe  shortness of breath with very mild  physical activity, and he now gets short of breath just walking across the  room to the bathroom. He has had some tachy palpitations. He denies any  dizzy spells or syncopal episodes. He had mild chest tightness, but this has  only occurred when he had severe shortness of breath. He otherwise denies  any symptoms of chest pain. He has had intermittent dry cough that has been  nonproductive. He denies lower extremity edema.   [name] was admitted to the hospital on December 27 with newly diagnosed  persistent atrial fibrillation and severe shortness of breath. He ruled out  for acute myocardial infarction. He subsequently underwent transthoracic as  well as transesophageal echocardiogram and both left and right heart  catheterization. These demonstrate the presence of mitral valve prolapse  with severe mitral regurgitation, normal left ventricular function, and  insignificant coronary artery disease. Mr. Koren has been referred for  possible elective mitral valve repair with or without concomitant maze  procedure.   REVIEW OF SYSTEMS:  GENERAL:  Mr [name] reports  good appetite. He has not  been gaining or losing weight significantly recently. CARDIAC: As noted  previously. RESPIRATORY: Notable only for symptoms of shortness of breath  and intermittent dry nonproductive cough. The patient denies productive  cough, hemoptysis, wheezing. He quit smoking 2 years ago. GASTROINTESTINAL:  Negative. The patient has no significant difficulty swallowing. He denies  hematochezia, hematemesis, melena. His bowel function is regular.  NEUROLOGIC: Negative. The patient denies symptoms suggestive of previous TIA  or stroke. MUSCULOSKELETAL: Notable for some arthritis afflicting the  fingers of both hands. This is not terribly limiting. The patient denies  significant problems with his back or legs. GENITOURINARY: Notable for some  mild urinary incontinence ever  since his prostatectomy. This does not seem  to be limiting. He denies urinary urgency, frequency or hematuria. His most  recent PSA level was zero. PSYCHIATRIC: Negative. HEENT: Negative. The  patient is edentulous with full set of up and lower dentures. He has no  recent changes in his eyesight.   PAST MEDICAL HISTORY:  1.  Mitral regurgitation.  2.  Persistent atrial fibrillation, newly diagnosed  3.  Episode of paroxysmal atrial flutter and atrial fibrillation, November      2003.  4.  Hypertension, recently diagnosed.  5.  Hyperlipidemia, recently diagnosed.  6.  Prostate cancer, status post radical prostatectomy.  7.  Chronic obstructive pulmonary disease.  8.  Cataracts.  9.  Warthin's tumor of the parotid gland, status post left superficial      parotidectomy.   PAST SURGICAL HISTORY:  1.  Radical prostatectomy by Dr. Retta Diones.  2.  Left superficial parotidectomy.  3.  Repair of right inguinal hernia and umbilical hernia.  4.  Right cataract extraction.   FAMILY HISTORY:  Noncontributory.   SOCIAL HISTORY:  The patient is married and lives with his wife in  [place].  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 The patient has longstanding history of heavy tobacco use,  smoking three packs of cigarettes per day for many years. The patient quit  smoking 2 years ago completely. The patient denies history of significant  alcohol consumption.   MEDICATIONS PRIOR TO ADMISSION:  Vitorin 10/20 one tablet daily.   DRUG ALLERGIES:  None known.   NEW MEDICATIONS:  Cardizem CD 120 milligrams daily which began today.   PHYSICAL EXAMINATION:  GENERAL:  The patient is a well-appearing, mildly  obese white male who appears stated age in no acute distress. He is  currently in atrial fibrillation with heart rate in the 90s, blood pressure  stable. HEENT:  Exam is grossly  unrevealing.  NECK:  Supple. There is no cervical or supraclavicular lymphadenopathy.  There is no jugular venous distension. No carotid bruits are noted.  CHEST:  Auscultation of the chest demonstrates clear breath sounds  bilaterally. No wheezes or rhonchi noted. Breath sounds are symmetrical.  CARDIOVASCULAR:  Exam is notable for irregular heart rhythm. There is a loud  grade 4/6 holosystolic murmur heard all across the precordium with radiation  to the axilla. No diastolic murmurs were noted.  ABDOMEN:  The abdomen is mildly obese but soft, nontender. There are no  palpable masses. Bowel sounds present.  EXTREMITIES: Warm and well-perfused. There is no lower extremity edema.  Distal pulses are easily palpable in both lower legs.  RECTAL/GU:  Both exams deferred. Neurologic examination is grossly nonfocal  and symmetrical  throughout.   DIAGNOSTIC TESTS:  The transthoracic and transesophageal echocardiogram was  performed this hospital admission, and both have been reviewed. The  transesophageal echocardiogram performed December 29 by Dr. Eden Emms clearly  demonstrates flail segment of the middle scallop of the posterior leaflet of  the mitral valve with severe (4+) mitral regurgitation. The anterior leaflet  appears reasonably normal with only mild prolapse. Left ventricle appears  normal. Left atrium does appear dilated. Interatrial septum appears intact  with no sign of patent foramen ovale. The tricuspid valve appears to be  intact with trace to mild tricuspid regurgitation. No other significant  abnormalities were noted.   Left and right heart catheterization performed December 29 by Dr. Eden Emms are  also reviewed. These demonstrate no significant coronary artery disease.  There is approximately 40% stenosis of the mid right coronary artery. There  is right dominant coronary circulation. No other significant lesions are  appreciated. Pulmonary artery pressures are mildly elevated at  31/20 with  pulmonary capillary wedge pressure of 22 and a large V wave. Baseline  cardiac output 4.2 liters per minute, corresponding to cardiac index of 2.0.   IMPRESSION:  Mitral valve prolapse now with flail posterior leaflet of  mitral valve and severe (4+) mitral regurgitation. Mr. Roessner has history of  paroxysmal atrial fibrillation and atrial flutter in the past and now  presents with recent onset persistent atrial fibrillation. He has preserved  left ventricular function. I believe that he would be a good candidate for  elective mitral valve repair and maze procedure. His risks of surgery may be  somewhat increased due to his history of heavy tobacco use in the past with  likely underlying chronic obstructive pulmonary disease. Based upon echo  findings I feel there is a likelihood that valve repair will be feasible.   PLAN:  I have outline options at length with Mr. Maret and his family. Alternative treatment strategies have been discussed in detail. They  understand and accept all associated risks of surgery including but not  limited to risk of death, stroke, myocardial infarction, congestive heart  failure, respiratory failure, pneumonia, bleeding requiring blood  transfusion, arrhythmia, heart block or bradycardia requiring permanent  pacemaker, late complications related to his valve requiring reoperation,  possible need for valve replacement if valve repair is not feasible,  recurrent problems with congestive heart failure, recurrent problems related  to coronary artery disease. They understand the rationale for concomitant  maze procedure in effort to treat the patient's underlying history of atrial  fibrillation. They also understand that if valve repair is not technically  feasible, that we would proceed with valve replacement. Under the  circumstances, we would tend to favor use of a mechanical prosthesis in an  effort to decrease the patient's risk for late valve  degeneration or failure  in the future and subsequent need for further surgical intervention. He  understands that, if this were necessary, the patient would need to be  anticoagulated long-term with Coumadin. All their questions have been  addressed. We tentatively plan to proceed with surgery on Wednesday, January  4.      Clar   CHO/MEDQ  D:  09/17/2004  T:  09/17/2004  Job:  [id number]   cc:   Olga Millers, M.D. [facility]   Charlton Haws, M.D.   Titus Dubin. Alwyn Ren, M.D. [facility]

## 2011-02-04 NOTE — H&P (Signed)
NAMEAYDIEN, MAJETTE NO.:  [id number]   MEDICAL RECORD NO.:  [id number]          PATIENT TYPE:  INP   LOCATION:  3741                         FACILITY:  [facility]   PHYSICIAN:  Olga Millers, M.D. LHCDATE OF BIRTH:  06-Jul-1936   DATE OF ADMISSION:  09/09/2005  DATE OF DISCHARGE:                                HISTORY & PHYSICAL   PRIMARY CARE PHYSICIAN:  Titus Dubin. Alwyn Ren, M.D.   PRIMARY CARDIOLOGIST:  Olga Millers, M.D.   PATIENT PROFILE:  A 75 year old white male with a prior history of severe MR  status post mitral valve annuloplasty and ring, as well as atrial  fibrillation on chronic Coumadin, who presents with recurrent cardioembolic  strokes with recent TEE revealing persistent vegetation versus thrombus.   PROBLEM LIST:  1.  History of severe MR.      1.  Status post MVR October 12, 2004 with annuloplasty and ring done by          Dr. Cornelius Moras.  2.  Atrial fibrillation status post Maze procedure.  3.  Cardioembolic stroke.      1.  July 10, 2005 - CVA.      2.  August 31, 2005 - MRI revealing 2 additional areas of embolic CVA.      3.  September 07, 2005 - TEE revealing persistent mitral valve vegetation          versus thrombus.  4.  Chronic anticoagulation.  5.  Hyperlipidemia.  6.  Remote heavy tobacco abuse.  7.  CHF.  8.  Hypertension.  9.  History of prostate CA.  10. Anxiety.  11. Normal coronary arteries by cardiac catheterization on September 16, 2004.  12. Erectile dysfunction.  13. History of adrenal adenoma.  14. COPD.  15. Pneumonia in June of 2006.   HISTORY OF PRESENT ILLNESS:  A 75 year old white male with history of severe  MR and atrial fibrillation, status post MVR/ring and Maze procedure done by  Dr. Cornelius Moras September 22, 2004.  Postoperative course has been complicated by  endocarditis in June of 2006 requiring 6 weeks of IV Rocephin and  vancomycin, diskitis and back pain, and more recently cardioembolic CVA on  July 10, 1609.  Since then, he has had 2 additional episodes of profound  weakness, like I don't have a bone in my body, which he describes as  slumping for approximately 1 hour resolving spontaneously.  He was not seen  in the hospital for these.  This, however, prompted Dr. Sandria Manly to obtain an  MRI on August 31, 2005, which reportedly showed 2 additional areas of  embolic CVA.  As a result, a TEE was performed by Dr. Jens Som on September 07, 2005 revealing a persistent mitral valve vegetation versus clot.  The  decision was made at that point to bring the patient into the hospital for  surgical evaluation and resection next week.  He has been maintained on  Coumadin as an outpatient secondary to history of atrial fibrillation with  cardioembolic strokes.  He will be taken off of  his Coumadin here, and when  his INR is less than 2.0, he will be placed on a heparin bridge.   ALLERGIES:  No known drug allergies.   HOME MEDICATIONS:  1.  Cartia XT 120 mg daily.  2.  Digoxin 0.125 mg daily.  3.  Klor-Con 10 mEq daily.  4.  Vytorin 10/20 mg daily.  5.  Clonazepam 0.5 mg h.s.  6.  Coumadin as directed.  7.  Keflex 500 mg b.i.d.  8.  Aspirin 81 mg daily.   FAMILY HISTORY:  Mother died of cancer at age 23.  Father died of CVA and  CHF at age 38.  He has 1 brother and 1 sister who are alive and well.   SOCIAL HISTORY:  He lives in [place] with his wife.  He is a retired  Land.  He smokes approximately 3 packs a day for 60 years, having  quit last December.  No alcohol or drugs.  He currently walks 1 mile a day.   REVIEW OF SYSTEMS:  Positive for periodic visual changes over the past year.  Shortness of breath and dyspnea on exertion after walking about 100 yards  that is resolved with rest, and then he can walk a mile without any other  limitations.  Weakness and anxiety.  Low back pain.  All other systems  reviewed and negative.   PHYSICAL EXAMINATION:  GENERAL:  A pleasant  white male in no acute distress.  Awake, alert and oriented x3.  NECK:  Normal carotid upstrokes.  No bruises or JVD.  LUNGS:  Respirations regular and unlabored.  CARDIAC:  Irregularly irregular S1 and S2.  A 2/6 systolic murmur at the  left upper sternal border.  ABDOMEN:  Rounded, soft, nontender, nondistended.  Bowel sounds present.  LOWER EXTREMITIES:  Warm, dry, pink.  No clubbing, cyanosis, or edema.  Dorsalis pedis and posterior tibial pulses are 1+ and equal bilaterally.   Chest x-ray is currently pending, as is his EKG.  All laboratory work is  pending.   ASSESSMENT AND PLAN:  1.  Cardioembolic cerebrovascular accident with mitral valve vegetation      versus thrombus by TEE on July 08, 2005.  Plan - The plan per Dr.      Jens Som is to admit.  Will hold his Coumadin, and when his INR is less      than 2.0, we plan for heparin bridging, given his history of atrial      fibrillation.  CVTS will see the patient, and the plan is for what is      felt to be a fairly high-risk resection of mitral valve vegetation early      next week.  2.  Atrial fibrillation with rate controlled on Diltiazem and digoxin.  Will      hold Coumadin and plan for heparin      bridge.  3.  Hyperlipidemia.  Continue __________.  Will check lipids and LFT's.  4.  Anxiety.  Continue clonazepam.      Ok Anis, NP    ______________________________  Olga Millers, M.D. [facility]    CRB/MEDQ  D:  09/09/2005  T:  09/10/2005  Job:  [id number]

## 2011-02-04 NOTE — Consult Note (Signed)
NAMEANDREWJAMES, WEIRAUCH NO.:  [id number]   MEDICAL RECORD NO.:  [id number]          PATIENT TYPE:  INP   LOCATION:  3741                         FACILITY:  [facility]   PHYSICIAN:  Salvatore Decent. Cornelius Moras, M.D. DATE OF BIRTH:  [birth year]   DATE OF CONSULTATION:  09/13/2005  DATE OF DISCHARGE:                                   CONSULTATION   REFERRING CARDIOLOGIST:  Dr. Olga Millers.   REASON FOR CONSULTATION:  Mitral valve mass with recurrent embolic strokes.   HISTORY OF PRESENT ILLNESS:  Mr. Mcgroarty is a 75 year old gentleman well-  known to me. He originally presented with symptomatic mitral regurgitation  with atrial fibrillation and underwent mitral valve repair with a maze  procedure in January2006. His initial postoperative recovery was  uncomplicated. In April2006, the patient first developed some visual  disturbances and MRI was performed demonstrating small areas of likely  cerebral infarction, presumably from an embolic source. In May, the patient  presented with severe pain in his back that ultimately was attributed to  infectious diskitis involving the lumbar spine. He developed respiratory  distress and respiratory failure and was hospitalized for prolonged period  of time. During that time, he underwent an echocardiogram as well as  transesophageal echocardiogram revealing the presence of an intact mitral  valve repair with mobile density noted on the mitral valve suspicious for  possible thrombus versus vegetation. The patient had no other findings  suspicious for bacterial endocarditis with the exception of a single blood  culture obtained that grew coag negative Staphylococcus aureus. Since that  time, the patient has been treated medically with continuous antibiotics as  well as continuous anticoagulation using Coumadin and aspirin. Clinically,  the patient improved overall from a functional standpoint and had returned  to essentially normal physical  activity other than intermittent mild  exertional shortness of breath. However, over the last month, the patient  has suffered at least two or three transient neurologic events and follow-up  MRI demonstrate findings consistent with recurrent embolic strokes. The  patient has been followed carefully by Dr. Jens Som and Dr. Sandria Manly and was  readmitted to the hospital for further evaluation and management. A repeat  transesophageal echocardiogram was performed by Dr. Jens Som on December19,  2006. This confirmed the presence of a persistent mobile mass adherent to  the atrial surface of the mitral valve predominantly along the annuloplasty  ring near the base of the anterior leaflet of the mitral valve. This mass is  greater than 2 cm in size and may have actually increased in size in  comparison to the most recent previous transesophageal echocardiogram. It  has the appearance suggestive of a possible vegetation and/or thrombus. The  mitral valve remains to be completely intact from a functional standpoint  and there is no residual mitral regurgitation with mild stenosis across the  mitral valve. Left ventricular function remained stable. No other  abnormalities were noted. The patient underwent repeat cardiac  catheterization today by Dr. Eden Emms. This confirms the absence of any high-  grade significant coronary artery disease. The patient does have 40-50%  stenosis of mid right coronary artery which may be slightly worse in  comparison with the previous catheterization one year ago.   REVIEW OF SYSTEMS:  The patient reports feeling well otherwise. He has good  appetite. He has not been gaining or losing weight. He denies constitutional  symptoms and specifically denies fevers, chills, arthralgias, anorexia or  night sweats. CARDIAC: The patient reports exertional shortness of breath  that seems to be mild. He is able to attend to most normal physical  activities without limitation, although  occasionally he states his breathing  seems to be worse. He states that occasionally wakes up middle night gasping  for breath but he blames this on anxiety. He has not had any lower extremity  edema. He has had some dizzy spells in the past, although none recently. He  has never had any syncopal episodes. He never had any chest pain or chest  tightness whatsoever. RESPIRATORY:  Notable only for the exertional  shortness of breath. The patient denies productive cough, hemoptysis,  wheezing. GASTROINTESTINAL:  Negative. The patient does report some mild  symptoms of reflux. He has no difficulty swallowing. He reports normal bowel  function. MUSCULOSKELETAL:  Negative. NEUROLOGIC:  Notable for his recent  stroke. The patient reports that still at present he has some difficulty  finding words when he is trying to speak. He has always had a tendency to  stutter but he feels as though his speech is somewhat worse and that this  has persisted since his previous strokes. He denies any other focal areas of  weakness. He has had some occasional episodes of double vision and blurry  vision but these are always transient. GENITOURINARY: Negative. INFECTIOUS:  Negative for absence of any fevers or chills. The patient denies any  residual pain in his back that have been associated previously with his  diskitis.   PAST MEDICAL HISTORY:  1.  Myxomatous degenerative disease of the mitral valve with mitral valve      prolapse, status post mitral valve repair January24,2006.  2.  Persistent atrial fibrillation, status post maze procedure.  3.  Postoperative atrial flutter.  4.  Cardioembolic strokes (multiple and recurrent).  5.  Hyperlipidemia.  6.  Remote tobacco use.  7.  Remote congestive heart failure.  8.  Hypertension.  9.  Prostate cancer.  10. Anxiety.  11. Moderate right coronary artery stenosis.  12. Erectile dysfunction. 13. History of benign adrenal adenoma.  14. Chronic obstructive  pulmonary disease.  15. Pneumonia complicated by respiratory failure June2006.  16. L4-5 infectious diskitis treated medically, presumably resolved.  17. Possible subacute bacterial endocarditis in the setting of previous      mitral valve repair.   SOCIAL HISTORY:  The patient is married and lives with his wife in  [place]. He is a retired [employer]. He quit smoking in December2005.  Prior to that, he smoked heavily for 60 years. He denies alcohol  consumption. He currently walks in excess of one mile every day.   FAMILY HISTORY:  The patient has no history for premature coronary artery  disease and no other family members with valvular heart disease.   MEDICATIONS PRIOR TO ADMISSION:  1.  Coumadin.  2.  Keflex 500 milligrams twice daily.  3.  Cardia XT 120 milligrams daily.  4.  Digoxin 0.125 milligrams daily.  5.  Potassium chloride 10 mEq daily.  6.  Vytorin 10/20 1 tablet daily.  7.  Clonazepam 0.5 milligrams every night.  8.  Aspirin 81 milligrams daily.   DRUG ALLERGIES:  None known.   PHYSICAL EXAM:  The patient is a well-appearing, mildly obese male who  appears his stated age in no acute distress. He has been afebrile and  normotensive. His rhythm appears to be a low junctional rhythm, atrial  flutter or ectopic atrial rhythm. HEENT:  Exam is grossly unrevealing.  Auscultation of chest reveals clear symmetrical breath sounds bilaterally.  No wheezes or rhonchi demonstrated. There is a well-healed median sternotomy  scar. CARDIOVASCULAR:  Exam includes regular rate and rhythm. No murmurs,  rubs or gallops noted. The abdomen is obese but soft, nontender. There is no  ascites. There are no palpable masses. Liver edge is not palpable. Bowel  sounds are present. EXTREMITIES:  Warm and well-perfused. There is no lower  extremity edema. Distal pulses are palpable in both lower legs in the  posterior tibial position. There are signs of mild venous insufficiency in   both lower leg. Rectal and GU exams are both deferred. NEUROLOGIC:  Examination is grossly nonfocal.   DIAGNOSTIC TEST:  Transesophageal echocardiogram performed December19, 2006  is reviewed. Findings are as discussed previously. There remains a mobile  mass adherent to the atrial surface of the mitral valve particularly along  the annuloplasty ring near the base of the anterior leaflet of mitral valve.  Mitral valve itself appears to be functioning normally with no residual  mitral regurgitation. There is normal left ventricular function with  ejection fraction 45-50%. No other significant abnormalities were noted.  There is mild aortic insufficiency.   Cardiac catheterization performed by Dr. Eden Emms is reviewed. This  demonstrates luminal irregularities in the coronary arteries with exception  of 40-50% stenosis of mid-right coronary artery. There is right dominant coronary circulation. No other significant lesions in the coronary arteries  were noted.   IMPRESSION:  Mobile mass adherent to the atrial surface of the mitral valve  that presumably represent vegetation and/or clot despite more than six  months of continuous antibiotics and continuous anticoagulation with  multiple recurrent embolic stroke. Under the circumstances, I agree that  this likely represents smoldering subacute bacterial endocarditis in the  setting of a patient with previous mitral valve repair that has now failed  medical therapy. Under the circumstances, I would favor proceeding to  surgery for redo mitral valve repair or possible mitral valve replacement.  We will review the cardiac cath films further to entertain whether or not  concomitant coronary artery bypass grafting of the right coronary artery  seems necessary.   PLAN:  I have discussed options at length with Mr. Stockley and his family.  They understand and accept all associated risks of surgery including but not  limited to risk of death, stroke,  myocardial infarction, congestive heart  failure, respiratory failure, pneumonia, bleeding requiring blood  transfusion, arrhythmia, heart block with bradycardia requiring permanent  pacemaker, possible need for valve replacement if valve repair is not  feasible, possibility of recurrent thrombus or infection in the future  despite surgical intervention. After discussing options with respect to type  of prosthesis to be utilized if valve replacement as necessary, Mr. Espinoza  and his family specifically requests at a mechanical prosthesis be utilized  in an effort to decrease the likelihood of possible need for recurrent  surgery down the road in the future. They understand that if valve  replacement as necessary, Mr. Barella will therefore need to remain on  lifelong anticoagulation in the setting of mechanical mitral valve  replacement. All their questions have been addressed.      Salvatore Decent. Cornelius Moras, M.D.  Electronically Signed     CHO/MEDQ  D:  09/13/2005  T:  09/14/2005  Job:  [id number]

## 2011-02-04 NOTE — Op Note (Signed)
[name], [name] NO.:  [id number]   MEDICAL RECORD NO.:  [id number]          PATIENT TYPE:  INP   LOCATION:  2309                         FACILITY:  [facility]   PHYSICIAN:  Salvatore Decent. Cornelius Moras, M.D. 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   PROCEDURE:  Redo median sternotomy for redo mitral valve repair (debridement  of mitral valve with removal of vegetations and 26-mm Edwards ring  annuloplasty).   SURGEON:  Dr. Salvatore Decent. Cornelius Moras   ASSISTANT:  Mr. Gershon Crane   ANESTHESIA:  General   CLINICAL NOTE:  The patient is a 75 year old male who underwent mitral valve  repair and maze procedure on January4,2006 for degenerative disease of the  mitral valve with mitral valve prolapse and severe mitral regurgitation as  well as persistent atrial fibrillation. His initial postoperative recovery  was uncomplicated. In April2006, the patient presented with transient visual  disturbances and was ultimately diagnosed with probable embolic stroke due  to the presence of small strokes identified on MRI of the brain. In [id number]  the patient presented with worsening back pain and was found to have  infectious diskitis. He subsequently developed pneumonia with respiratory  failure. During this hospitalization at that time, he underwent  transesophageal echocardiogram that demonstrated the presence of what  appeared to be a mobile mass adherent to the atrial surface of the mitral  valve suspicious for possible vegetation or thrombus. The patient did not  have any other signs or symptoms convincing for bacterial endocarditis,  although out of numerous  blood cultures obtained. One set of blood cultures  did grow coagulase negative Staphylococcus aureus. The patient was treated  presumptively for subacute bacterial endocarditis with antibiotics and he  was also anticoagulated with both Coumadin and aspirin. Clinically the  patient seemed to gradually improve. However, in November2006 the patient  returned with recurrent transient neurologic deficits and was found again to  have evidence for recurrent strokes of a presumed a cardiogenic embolic  source. The patient subsequently underwent follow-up transesophageal  echocardiogram on December19 by Dr. Jens Som. This confirmed the presence of  persistent mobile mass adherent to the atrial surface of the mitral valve  suspicious for possible vegetation. The patient is now brought to the  operating room with the presumed diagnosis of persistent subacute bacterial  endocarditis that has failed long-term medical therapy and resulted in  recurrent embolic strokes.   OPERATIVE CONSENT:  The patient and his family have been counseled at length  regarding the indications, risks, and potential benefits of surgical  intervention. Alternative treatment strategies have been discussed. They  understand and accept all associated risks of surgery and desire to proceed  as described. The patient also underwent follow-up cardiac catheterization  on December26. This demonstrates 40% stenosis of the mid right coronary  artery. This appears similar to how it looked at the time of cardiac  catheterization 1 year previously. The stenosis of the right coronary artery  is not felt to be flow-limiting. The patient otherwise has no significant  signs of coronary artery disease.   OPERATIVE FINDINGS:  1.  Mobile, soft, friable vegetations adherent to the mitral valve annulus      consistent with probable bacterial or fungal endocarditis.  2.  Normal appearing mitral valve leaflets and subvalvular apparatus.  3.   Normal left ventricular systolic function.  4.  No residual mitral regurgitation after successful redo mitral valve      repair.   OPERATIVE NOTE:  The patient is brought to the operating room on above-  mentioned date and placed in the supine position on the operating table.  Central monitoring was established by the anesthesia service under the care  and direction of Kaylyn Layer. Michelle Piper, M.D.  Specifically, a Swan-Ganz catheter is  placed through the right internal jugular approach. A radial arterial line  is placed. Intravenous antibiotics were administered consisting of  cefuroxime and vancomycin. A Foley catheter was placed. The patient's chest,  abdomen, both groins, and both lower extremities were prepared, draped in  sterile manner.   Baseline transesophageal echocardiogram was performed by Dr. Michelle Piper. This  confirms the presence of what appears to be a semi mobile mass adherent to  the atrial surface of the mitral valve in the vicinity of the mitral valve  annulus, particularly located along the annuloplasty ring at the base of the  anterior leaflet of mitral valve. The mitral valve itself appears to be  functioning normally and there is no sign of any mitral regurgitation. There  is trace aortic regurgitation. The aortic valve otherwise appears normal and  specifically there is no sign of any vegetation on the aortic valve. The  tricuspid valve appears normal. Left ventricular function appears normal. No  other abnormalities were noted.   Redo median sternotomy incision was performed. After removal of all the  preexisting sternal wires, the sternum was divided using a sagittal saw.  Sternal entry was straightforward and uncomplicated. Sharp dissection was  now utilized to begin to dissect the structures of the mediastinum. This was  technically straightforward, although there are dense adhesions along the vicinity of the right atrium related to the patient's previous surgery with   dense adherence of the overlying pericardium to the surface of the right  atrium itself. Initially the ascending aorta was dissected from associated  nearby structures and subsequently the superior vena cava and innominate  vein were dissected from associated structures. The patient is heparinized  systemically. The ascending aorta was cannulated using a straight aortic  cannula placed immediately lateral to the site of previous cannulation at  the patient's original surgery. A right angle metal tipped cannula was  placed directly in the superior vena cava.   Cardiopulmonary bypass was begun. Dissection was continued until the tip of  the right atrial appendage was free and a straight venous cannula was now  placed directly through the tip of the right atrial appendage down through  the inferior vena cava. Dissection was now continued until a temperature  probe can be placed along the interventricular septum and a cardioplegic  catheter placed in the ascending aorta. There were dense adhesions along the  inferior wall of the left ventricle, and this was not dissected completely  as it is felt to be unnecessary. Vessel loops were placed around the  superior vena cava and inferior vena cava. The remainder of sharp dissection  was postponed until after surgical arrest has been achieved.   The patient is cooled to 28 degrees systemic temperature. Aortic crossclamp  was applied and cold blood cardioplegia was administered in antegrade  fashion through the aortic root. Iced saline slush was applied for topical  hypothermia. The initial cardioplegic arrest and myocardial cooling are felt  to be excellent. Repeat doses of cardioplegia are administered  intermittently throughout the  crossclamp portion of the operation through  the aortic root to maintain left ventricular septal temperature below 15  degrees centigrade. Sharp dissection was now continued until the dome of the  left atrium between  the aorta and the superior vena cava was clearly  identified from associated structures. Dissection was also continued in the  interatrial groove. This is somewhat adhered from the patient's previous  surgery, but the right superior pulmonary vein was identified.   A left atriotomy incision was now performed through the dome of the left  atrium in between the ascending aorta and the superior vena cava. After the  left atrium was opened, left ventricular vent was placed through the right  superior pulmonary vein across the floor of the left atrium to serve as a  sump suction device. The mitral valve was exposed using direct retraction  and exposure is felt to be excellent. One could appreciate loose soft,  friable tissue adherent to the mitral annuloplasty ring particularly along  the base of the anterior leaflet of mitral valve. There is additional vegetation located along the posterior annulus as well. Swab cultures were  obtained of this vegetation material for both routine aerobic and anaerobic  culture. The vegetation themselves were now sharply debrided and excised  completely. Portions of the vegetation was sent to pathology in two separate  specimens for stat Gram stain as well as routine aerobic, anaerobic, fungal  and AFB cultures. The preexisting annuloplasty ring is excised sharply. All  the preexisting annuloplasty sutures were removed. The mitral valve annulus  was debrided sharply. Any potentially nonviable or infected material was  debrided sharply. This actually appears to be technically straightforward  and there does not appear to be any significant destruction of the mitral  annulus or the mitral valve itself. The mitral valve leaflets all appear  normal as does the subvalvular apparatus. There is no sign of any  vegetations adherent to the mitral leaflets themselves as the vegetations  were all adherent to the preexisting annuloplasty ring and suture material.  After  the entire mitral valve annulus has been completely debrided, the left  atrium and left ventricle are irrigated with copious iced saline solution.  The mitral annulus was also painted with Betadine solution. All surgical  instruments were changed at this juncture. After further careful examination  of the mitral valve, the valve itself appears to remain perfectly competent.  Specifically, there is good symmetrical coaptation of the anterior posterior  leaflet and there is no sign of any residual infection or vegetation on the  leaflets themselves. Redo mitral valve repair was felt to be technically  straightforward and feasible using replacement of a second annuloplasty ring  to stabilize the repair.   Mitral valve repair was performed using interrupted 2-0 Ethibond horizontal  mattress sutures placed circumferentially around the entire mitral annulus.  The mitral valve was sized to accept a 26-mm annuloplasty ring which  corresponds to the same size ring that had been removed previously. This  size corresponds very precisely to the size of the anterior leaflet of  mitral valve, which is notably relatively small. 26-mm Edwards physio ring  annuloplasty is chosen (model number [id number], serial number [id number]). The  annuloplasty ring was secured in place uneventfully. After it is secured in  place, the ring itself was again painted with Betadine solution. The mitral  valve repair was inspected for competence by instilling iced saline into the  left ventricular chamber. The valve appears to be perfectly competent.  Rewarming was begun.   The left ventricular vent was now replaced across the mitral valve into the  left ventricle. The left atriotomy incision was closed using a two-layer  closure of running 3-0 Prolene suture. The patient was placed in  Trendelenburg position. One final dose of warm hot shot antegrade cardioplegia is administered. The lungs were ventilated and the heart  allowed to  fill to evacuate any residual air through the aortic root vent.  The aortic crossclamp was removed after a total crossclamp time of 81  minutes.   The heart began to beat spontaneously but subsequently fibrillates. The  heart was now defibrillated and a spontaneous rhythm resumes. The patient is  slowly rewarmed to 37 degrees centigrade temperature. Epicardial pacing  wires were fixed to right ventricular outflow tract and to the right atrial  appendage. The SVC cannula was removed after pulling IVC cannula back into  the right atrium. The left ventricular vent was removed. The lungs were  ventilated and heart allowed to fill and some residual air persists in the  left ventricular chamber and aortic root. This was evacuated through the  aortic root vent.   The patient is weaned from cardiopulmonary bypass without difficulty. The  patient rhythm at separation from bypass is AV block. AV sequential pacing  was employed. Total cardiopulmonary bypass time of the operation is 150  minutes. The patient is weaned from bypass on dopamine at 3 mcg/kg per  minute. Follow-up transesophageal echocardiogram performed by Dr. Michelle Piper  after separation from bypass demonstrates normal left ventricular function  with well seated mitral annuloplasty ring and normal appearing mitral valve  function with no residual mitral regurgitation. No other abnormalities were  noted.   The venous and arterial cannulae are removed uneventfully. The aortic root  vent was removed. Protamine was administered to reverse the anticoagulation.  The mediastinum was irrigated with saline solution containing vancomycin.  Meticulous surgical hemostasis was ascertained. The mediastinum and right  pleural space were drained using three chest tubes exited through separate  stab incisions inferiorly. The soft tissues anterior to the aorta are  reapproximated loosely.   The On-Q continuous pain management system is utilized for  postoperative  pain control. With this system, two 10 inches Silastic catheters were placed  on either side of the sternum using a tunneler device and located  immediately lateral to the lateral border of the sternum on either side in  the deep subcutaneous tissues immediately anterior to the costal cartilages.  Each of these catheters were secured in place to the skin with 3-0 silk  sutures and bolused with a total of 5 mL of 0.5% bupivacaine solution. Each  catheter was then ultimately connected to continuous infusion pump.   The sternum was reapproximated with double-strength sternal wire. The soft  tissues anterior to the sternum closed in multiple layers and the skin was  closed with a running subcuticular skin closure. The patient tolerated the  procedure well and was transported to the  surgical intensive care unit in  stable condition. There are no intraoperative complications. Normal sinus rhythm resumed spontaneously by the time arrival in the surgical intensive  care unit. No blood products were administered. All sponge, instrument and  needle counts verified correct at completion of the operation.      Salvatore Decent. Cornelius Moras, M.D.  Electronically Signed     CHO/MEDQ  D:  09/14/2005  T:  09/15/2005  Job:  [id number]   cc:   Olga Millers, M.D. [facility]  [address]  [address]  [place]  [place] [address]   Rockey Situ. Flavia Shipper., M.D.  Fax: [phone]   Genene Churn. Love, M.D.  Fax: [phone]   Titus Dubin. Alwyn Ren, M.D. [facility]  [phone] [place]  [place] [address]

## 2011-02-04 NOTE — Discharge Summary (Signed)
[name], [name]                ACCOUNT NO.:  [id number]   MEDICAL RECORD NO.:  [id number]          PATIENT TYPE:  INP   LOCATION:  2015                         FACILITY:  [facility]   PHYSICIAN:  Rosalyn Gess. Norins, M.D. LHCDATE OF BIRTH:  25-Jan-1936   DATE OF ADMISSION:  02/13/2005  DATE OF DISCHARGE:  03/01/2005                                 DISCHARGE SUMMARY   DISCHARGE DIAGNOSES:  1.  Status post hypoxic respiratory failure secondary to pneumonia and      questionable pulmonary embolus.  2.  Mitral valve thrombus versus vegetation.  3.  New onset diabetes.  4.  Acute renal failure.  5.  Chronic atrial fibrillation.  6.  Low back pain, questionable L5 diskitis. Will need repeat MRI in      approximately 3-4 weeks being followed by Dr. Sandria Manly.  7.  History of left adrenal nodule on CT. Will need possible outpatient      biopsy.   HISTORY OF PRESENT ILLNESS:  The patient is a 75 year old male who appears  older than state age who underwent a mitral valve repair on January 2006.  The patient reported a history of diplopia which has been evaluated closely  by Dr. Alwyn Ren, the patient's primary care. The patient underwent an MRI of  the brain as an outpatient which is unremarkable. In addition, the patient  reported sudden onset of mid back pain at time of admission. The patient is  admitted for further evaluation.   PAST MEDICAL HISTORY:  1.  History of mitral valve repair and Maze procedure January 2006.  2.  History of hypertension.  3.  History of hyperlipidemia.  4.  History of prostate cancer status post radical prostatectomy.  5.  Chronic obstructive pulmonary disease.  6.  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  7.  History of previous repair to right inguinal hernia and umbilical      hernia.  8.  History of cataract extraction.  9.  Chronic atrial fibrillation.   HOSPITAL COURSE:  #1.  SEVERE LOW BACK PAIN WITH  ASSOCIATED RIGORS, ONSET  Feb 10, 2005.  The patient was evaluated by infectious disease. An MRI scan  was obtained which showed fluid in the L4-5 disk with edema and adjacent  vertebral end plates. At the time of discharge, the patient's low back pain  has improved considerably. The patient was also evaluated by neurology who  suggests repeat MRI of the lumbar spine in 3-4 weeks depending on clinical  improvement.  #2.  ACUTE HYPOXIC RESPIRATORY DISTRESS SECONDARY TO ACUTE PE. The patient  developed hypoxia. A CT angio was obtained which showed a left lower lobe  pulmonary embolus. In addition, a 2.7 left adrenal nodule was noted. This  nodule needed additional outpatient followup and possible biopsy. The  patient was maintained on IV heparin, Coumadin was initiated during  hospitalization. The patient will need continued anticoagulation for PE. At  the time of discharge, O2 sat is 94% on room air.  #3.  MITRAL VALVE THROMBUS VERSUS  VEGETATION.  A transesophageal echo is  obtained which showed questionable thrombus versus vegetation. Infectious  disease placed the patient on vanco and ceftriaxone IV which will need to be  completed for a 42 day course. In addition, the patient will need to be  anticoagulated secondary to potential thrombus. Plan is to continue  antibiotics for 42 days and to continue anticoagulation with followup blood  cultures and transesophageal echo to be completed two weeks after  antibiotics have been completed.  #4.  ACUTE RENAL INSUFFICIENCY.  The patient's creatinine was noted to rise  at the time of discharge. Creatinine is 2.2. The patient will need continued  followup.  #5.  DIABETES TYPE 2.  The patient is noted to have hyperglycemia during  admission. The patient was initially started on Lantus which has now been  switched to p.o. Amaryl. Will need continued followup.  #6.  Intermittent loss of vision left eye with diplopia since January 2006.  The patient  had a CT head on June 2006 which was negative as well as a  carotid duplex which was done January 2006 which was negative. The patient  has been evaluated by primary care as well as neurology for these symptoms.   DISCHARGE MEDICATIONS:  1.  Coumadin to be dosed per pharmacy protocol with INR 2.5 to 3.0.  2.  Digoxin 0.125 mg p.o. daily.  3.  Clotrimazole topical cream 1% applied to affected area b.i.d.  4.  Diltiazem 120 mg p.o. daily.  5.  NovoLog insulin sliding scale q.a.c. and h.s.  6.  Amaryl 1 mg p.o. daily.  7.  Vancomycin IV to be dosed per pharmacy protocol.  8.  Rocephin IV to be dosed per pharmacy protocol.  9.  Senokot 1 tab p.o. q.h.s. p.r.n.  10. Percocet 1-2 tabs p.o. q.4-6 h p.r.n. pain.   LABORATORY DATA:  At discharge, INR 3.4, supratherapeutic. Creatinine 2.1.  Hemoglobin 9.6, hematocrit 28, white blood cell count 8.9, platelets 493.   FOLLOW UP:  The patient will need followup at time of discharge for  continued IV antibiotics perhaps home health administration at the time of  discharge from rehab. In addition, the patient will need followup with  cardiology for transesophageal echo to be completed two weeks after  completion of IV antibiotics. The patient will also need blood cultures x2  to be drawn two weeks after completion of IV antibiotics. The patient will  need followup with Dr. Alwyn Ren, his primary care Audry Pecina, after time of  discharge.   PLAN:  Discharge patient to rehab for additional therapy prior to discharge  to home.      [name]   MSO/MEDQ  D:  03/01/2005  T:  03/01/2005  Job:  [id number]   cc:   Titus Dubin. Alwyn Ren, M.D. [facility]

## 2011-02-04 NOTE — H&P (Signed)
[name], CARNEIRO                            ACCOUNT NO.:  [id number]   MEDICAL RECORD NO.:  [id number]                   PATIENT TYPE:  EMS   LOCATION:  MAJO                                 FACILITY:  [facility]   PHYSICIAN:  Valetta Mole. [name], M.D. [facility]           DATE OF BIRTH:  [birth year]   DATE OF ADMISSION:  08/03/2002  DATE OF DISCHARGE:                                HISTORY & PHYSICAL   CHIEF COMPLAINT:  Rapid heart beat.   HISTORY OF PRESENT ILLNESS:  [name] is a 75 year old male in his usual  state of health until approximately 7:00 this evening.  At that time, he  developed a rapid heart beat associated with some neck tightness.  It  occurred after eating dinner.  He was not exerting himself.  He had no  associated shortness of breath, diaphoresis, or nausea.  He did have a mild  headache.  He recalls having two or three similar episodes over the past  several years, but this time, symptoms lasted longer.  His son is a  paramedic and performed an EKG and demonstrated a rapid heart rate.  The  patient had no other associated symptoms.  No modifying factors known.  Duration of symptoms approximately 10 minutes before they resolved  completely.   PAST MEDICAL HISTORY:  1. COPD.  2. Inguinal hernia repair.   MEDICATIONS:  None.  His son gave him three baby aspirin a day.   SOCIAL HISTORY:  He is married.  He continues to smoke a pack per day.  He  drinks alcohol occasionally.   FAMILY HISTORY:  Father deceased of a stroke at age 35.  Mother deceased at  72 of spinal cancer.   REVIEW OF SYSTEMS:  He denies any specific chest pain, nausea, diaphoresis,  shortness of breath.  He denies any abdominal pain, change in bowel  movements.  He denies any hematemesis, GI blood loss.  He denies any  dermatologic complaints.  He denies any neurologic deficits.  He denies any  other complaints in the review of systems.   PHYSICAL EXAMINATION:  Temperature 97.5.  Respirations 28 on  the way to the  emergency room.  Heart rate 101.  Blood pressure 110/72.  At the time of my  examination, his respirations were 18.  GENERAL: he appears as a well-  developed, well-nourished male in no acute distress.  HEENT: atraumatic,  normocephalic.  EXTRAOCULAR MUSCLES: intact.  NECK:  Supple without  lymphadenopathy, thyromegaly, jugular venous distention or carotid bruits.  CHEST: clear to auscultation without increased work of breathing.  CARDIAC:  S1, S2 are normal without murmurs or gallops.  ABDOMEN: active bowel sounds,  soft, nontender, no hepatosplenomegaly, no masses are palpated.  NEUROLOGIC:  alert and oriented without any motor or sensory deficits.  EXTREMITIES: no  clubbing, cyanosis, or edema.   LABS:  White count 10.5, hemoglobin 16.4, platelet count 156,000.  CK total  186, C-met except for a glucose of 128 and an albumin of 3.3.  Troponin I is  pending.   Outside EKG demonstrates a PSVT, it looks like atrial fibrillation or at  times flutter.  EKG done in the hospital demonstrates normal sinus rhythm  with an incomplete right bundle branch block, a probable inferior MI in the  past.    ASSESSMENT/PLAN:  1. PSVT likely atrial fibrillation.  Will admit to rule out for an MI.  He     will need further evaluation with an echo, stress test and TSH.  Will     perform TSH in the hospital.  Other studies may be able to be done as an     outpatient depending on the laboratory work and the followup EKG.  2. Tobacco abuse, patient understands he should quit.  3. Hyperglycemia will followup as an outpatient.                                                   [name], M.D. [facility]    BHS/MEDQ  D:  08/03/2002  T:  08/04/2002  Job:  [id number]   cc:   [name], Dr.  [name]

## 2011-02-04 NOTE — H&P (Signed)
[name], [name] NO.:  [id number]   MEDICAL RECORD NO.:  [id number]          PATIENT TYPE:  IPS   LOCATION:  4153                         FACILITY:  [facility]   PHYSICIAN:  Erick Colace, M.D.DATE OF BIRTH:  09-Dec-1935   DATE OF ADMISSION:  03/01/2005  DATE OF DISCHARGE:                                HISTORY & PHYSICAL   [name] is a 75 year old male originally admitted to [facility] on Feb 13, 2005 with mid back pain and difficulty ambulating as  well as a low grade fever.  Neurology consulted.  MRI of the lumbar spine  showed degenerative disk disease with marked degenerative changes at L4-5  with edema and a question of diskitis.  He was started on Rocephin and blood  cultures grew out staph.  ID consulted for input and recommended adding IV  vancomycin.  On Feb 14, 2005 patient felt shortness of breath.  A CT angio  of the chest showed pulmonary embolism.  Patient on Coumadin.  Hem/onc  consulted.  Recommended bilateral lower extremity CT which was negative for  DVT and he also had a coagulopathy panel.  He developed respiratory failure  requiring intubation on Feb 14, 2005 and pulmonology felt the CT angio of  the chest was equivocal for PE and felt pneumonia was the cause of the  respiratory failure.  Patient also had fluid overload requiring diuresis,  right pleural effusion requiring thoracentesis of 750 mL of transudative  blood-tinged fluid.  This patient continued fevers despite antibiotics.  TEE  was done on February 22, 2005 showing a mitral valve mass versus thrombus.  Cardiology felt the mass represented a vegetation and recommended treatment  with six weeks of antibiotics, then repeat TEE and blood cultures.  If  positive, still would require surgical resection.  ID recommended continue  Zosyn through February 28, 2005 for the pneumonia and now just on the Rocephin  and IV vancomycin.  Other issues included acute renal failure,  anemia,  continued low back pain, and a repeat MRI was recommended by neurology in  the future.   PAST MEDICAL HISTORY:  Patient has had some constipation, also deafness.  Denies any lower extremity pain.  Mitral valve prolapse with Maze procedure  January 2006, COPD, chronic renal insufficiency, Martin's tumor of the  parotid gland, right THR, adrenal adenoma, prostatectomy for carcinoma,  hypertension.   SOCIAL HISTORY:  Married.  Lives in two-level home, two steps to enter.  Can  stay on the first level.  No tobacco or ethanol usage.   CURRENT FUNCTIONAL STATUS:  Min to mod assist transfers, min assist, min  guard assist, ambulation 40 feet x2 with a RW.   PHYSICAL EXAMINATION:  GENERAL:  Elderly male in no acute distress.  Mood  and affect is appropriate.  NEUROLOGIC:  His motor strength is 5/5 bilateral deltoid, biceps, triceps  grip as well as hip flexion, knee extension, ankle dorsiflexion.  His  sensation is normal bilateral upper and lower extremities.  He has no  evidence of ataxia.  Extraocular movements are intact.  Tongue is midline.  NECK:  Supple without adenopathy.  No evidence of jugular venous distention.  HEART:  Sounded regular.  I could appreciate no murmurs.  LUNGS:  Clear to auscultation.  ABDOMEN:  Positive bowel sounds, soft, nontender to palpation.  EXTREMITIES:  No clubbing, cyanosis, edema.  He had minimally decreased  shoulder range of motion in abduction bilaterally to about 150 degrees.  Remainder of range of motion appeared normal.  Patient had no tenderness to  palpation along his lumbar spine or along his posterior SI joint area.   ASSESSMENT/PLAN:  1.  Deconditioning secondary to critical illness with a 17-day hospital      stay, questionable severe degenerative disk disease for diskitis.  2.  Mitral mass versus vegetation, history of mitral valve prolapse.  3.  Chronic back pain.  Has had it for a couple years, but much worse than      usual  just prior to admission, in fact, prompting admission.  Will      continue Flexeril as well as schedule his Roxicet elixir.  4.  History atrial fibrillation.  Continue INR goal of 2.5-3.  5.  Mitral valve mass, question vegetation.  Intravenous vancomycin day 14      of 42 and intravenous Rocephin day 11 of 42.  6.  Hypoglycemia.  Will check hemoglobin A1c.  He is on Amaryl and no longer      is on Lantus.  7.  Diskitis.  Is already on antibiotics.  Will recheck MRI in one week.      Will see whether the changes seen at L4-5 are resolving at that time.   ESTIMATED LENGTH OF STAY:  One to two weeks.       AEK/MEDQ  D:  03/01/2005  T:  03/01/2005  Job:  [id number]

## 2011-02-04 NOTE — Consult Note (Signed)
NAMEMACKAY, HANAUER NO.:  [id number]   MEDICAL RECORD NO.:  [id number]          PATIENT TYPE:  OUT   LOCATION:  MRI                          FACILITY:  [facility]   PHYSICIAN:  Gustavus Messing. Orlin Hilding, M.D.DATE OF BIRTH:  02-25-1936   DATE OF CONSULTATION:  07/10/2005  DATE OF DISCHARGE:                                   CONSULTATION   CHIEF COMPLAINT:  Language deficit for 1 hour.   HISTORY OF PRESENT ILLNESS:  Mr. Almquist is a 75 year old right handed white  male with a history of atrial fibrillation, mitral valve prolapse status  post a Maze mitral valve repair. He is on chronic Coumadin with an INR of  2.2 yesterday. Yesterday, he experienced about a 1 hour episode of speech  arrest. Came to the emergency room where he had essentially cleared. Had a  negative CT. He was to have a MRI scan in the emergency room with neurologic  follow if it was abnormal but unbeknownst to be, was scheduled to have the  MRI today and sent home. He came in to have the MRI today and it showed some  small embolic strokes bilaterally in the MCA distribution, left greater than  right. The symptoms have cleared, however.   REVIEW OF SYSTEMS:  Positive for some recent back pain. He has a recent  diskitis. Ocular migraine with some diplopia. Some mild chronic ataxia for 6  months. The episode of speech loss.   PAST MEDICAL HISTORY:  Significant for chronic atrial fibrillation on  chronic Coumadin. Mitral valve mass with a history of mitral valve prolapse  and repair. Hypertension. Cataract surgery, hernia surgery, prostate cancer  status post surgery. Left parotidectomy for a Horton's tumor in 2004. COPD.  Adrenal adenoma. Chronic renal failure with an episode of acute renal  failure in May of 2006. Diabetes mellitus type 2 and hypertension.   MEDICATIONS:  Coumadin, Digoxin, Cardizem, K-Dur, Oxy-IR, Flexeril, Xanax,  Cephalexin, Clonazepam, Topamax, Docusate, and Vytorin.   ALLERGIES:   No known drug allergies.   SOCIAL HISTORY:  He is married. No cigarette or alcohol use. He is  independent in daily activities.   FAMILY HISTORY:  Noncontributory.   PHYSICAL EXAMINATION:  HEENT:  Normocephalic and atraumatic.  NECK:  Supple without bruits.  NEUROLOGIC:  On the stroke scale, gets a score of zero. He is awake and  alert. He answers 2 questions correctly. He follows 2 commands correctly.  Best gaze is full. Extraocular movements, visual fields are full without  extinguishing. There is no facial weakness. There is no drift in upper or  lower extremities with normal station in gait and strength. No ataxia on  finger-to-nose and heel-to-shin. Normal sensory examination. No aphasia at  present. He does have a little bit of stuttering every now and then, which  he said that he has had chronically but he names, repeats, and has normal  fluent speech. Follows commands without difficulty. There is no dysarthria  and no evidence of neglect.   LABORATORY DATA:  MRI shows tiny bilateral, left greater than right, MCA  distribution diffusion-positive  infarcts, which are consistent with  cardioembolic disease. MR angiogram of the head and neck show no significant  stenosis.   IMPRESSION:  Small cardioembolic infarct secondary to atrial fibrillation,  mitral valve disease despite therapeutic Coumadin with an INR of 2.2.   RECOMMENDATIONS:  As there is no deficit and no operable lesion in his neck  and he is therapeutic on Coumadin, I do not think that he needs to be  admitted. Would add 81 mg aspirin and send him home. He should followup with  Dr. Sandria Manly and Dr. Alwyn Ren. He may need to have his Coumadin increased slightly  to shoot for a range of 2.5 and 3.5 rather than 2 to 3.      Catherine A. Orlin Hilding, M.D.  Electronically Signed     CAW/MEDQ  D:  07/10/2005  T:  07/11/2005  Job:  [id number]   cc:   Titus Dubin. Alwyn Ren, M.D. [facility]  [phone] [place]  [place]  [address]   Genene Churn. Love, M.D.  Fax: [phone]

## 2011-02-04 NOTE — Assessment & Plan Note (Signed)
[facility]                              CARDIOLOGY OFFICE NOTE   [name], [name]                       MRN:          [id number]  DATE:05/09/2006                            DOB:          11-21-1935    [name] returns for followup today.  Please refer to my previous notes  for details.  Since I last saw him, he has been cardioverted from atrial  flutter to normal sinus rhythm.  He feels much better since then.  He denies  any dyspnea on exertion at present and there is no orthopnea, PND, pedal  edema, palpitations, presyncope, syncope, or chest pain.   His medications at present include:  1. Aspirin 81 mg p.o. daily.  2. Vytorin 10/20 mg p.o. q.h.s.  3. Metoprolol 25 mg p.o. b.i.d.  4. Coumadin as directed.   PHYSICAL EXAMINATION:  VITAL SIGNS:  Show a blood pressure of 110/70 and his  pulse is 70.  He weighs 198 pounds.  CHEST:  Clear.  CARDIOVASCULAR:  Reveals a regular rate and rhythm.  There is no systolic  murmur noted.  EXTREMITIES:  Show no edema.   His electrocardiogram shows a sinus rhythm at a rate of 69.  There is a  first degree AV block but there are no ST changes noted.   DIAGNOSES:  1. Status post mitral valve repair.  2. Atrial fibrillation/flutter, status post cardioversion to normal sinus      rhythm.  3. History of embolic cerebrovascular accidents.  4. Coumadin therapy.  5. History of hyperlipidemia.   PLAN:  1. [name] is doing well from a symptomatic standpoint.  He is much      improved after cardioverting to sinus rhythm.  We will continue with      his present medications.  2. Given his history of mitral valve repair and complication of SBE, we      will continue with SBE prophylaxis.  We will see him back in      approximately 9 months.  At that time, I will repeat his      echocardiogram.  3. Given his history of both atrial fibrillation and flutter, we will      continue with his Coumadin with a  goal INR of 2 to 3.                              Madolyn Frieze Jens Som, MD, [facility]    BSC/MedQ  DD:  05/09/2006  DT:  05/09/2006  Job #:  [id number]

## 2011-02-04 NOTE — Consult Note (Signed)
NAMEBEDFORD, WINSOR NO.:  [id number]   MEDICAL RECORD NO.:  [id number]          PATIENT TYPE:  INP   LOCATION:  3036                         FACILITY:  [facility]   PHYSICIAN:  Genene Churn. Love, M.D.    DATE OF BIRTH:  Jan 14, 1936   DATE OF CONSULTATION:  02/13/2005  DATE OF DISCHARGE:                                   CONSULTATION   PATIENT'S ADDRESS:  [address] [place], [place] [address].   MRN NUMBER:  [id number].   DATE OF BIRTH:  20-Oct-1935.   This is a 75 year old right handed white married male from [place], [place] seen at the request of Dr. Illene Regulus for evaluation of lower  back pain, abnormal MRI study and a history of double vision.   HISTORY OF PRESENT ILLNESS:  Mr. Coles has a history of  mitral valve  disease and is status post maze mitral valve procedure September 22, 2004.  He  has been on Warfarin therapy since that time.  In addition, he has a known  history of prostatic cancer status post prostatectomy, left parotid tumor  surgery, right inguinal and umbilical hernia surgery, and status post right  eye cataract surgery.  He had been on Lipitor and was switched to Vytorin  about 4 months ago, and 3-4 months ago noted the onset of double vision  occurring as often as once to two times per day, characterized by relief  with closing either eye.  There was no associated pain with it.  He noted  nothing which brought it on and nothing which made it worse.  It did not  seem to make any difference whether he looked at distance, at close or to  left or to right.  He has not noted any other muscle weakness or muscle  pains.  On Feb 10, 2005, he was in [employer], he stood up in the morning  after having coffee, noted the onset of severe mid back, waistline pain,  unrelenting, that did not radiate to his legs.  This was followed  subsequently by chills yesterday morning and he was admitted Feb 12, 2005,  and placed on  Rocephin.  MRI study at the lumbar spine raised the question  of early diskitis at the L4-5 level with degenerative disk disease at 3-4, 4-  5 and 5-6.  There is also fluid in the facet joints at the 4-5 level.  Disk  bulges are present at all three levels and it is not clear whether the  changes at the L4-5 or early diskitis vs. degenerative changes.  The patient  has not had any urinary incontinence or burning.  He denies any skin  lesions.  He has not noted any fever but did notice some chills on the  morning of Feb 12, 2005.   MEDICATIONS AT HOME INCLUDE:  Warfarin, Vytorin and Toprol XL.   He has recently been evaluated by Dr. Dagoberto Ligas for double vision and no  ocular etiology has been found.   His laboratory studies following admission have revealed a white blood cell  count of 10,700, hemoglobin 13.9, hematocrit 41.3 and a platelet count of  145K.  His serum sodium was 133 with a potassium of 3.2, chloride 102, CO2  __________.  BUN 14, creatinine 1.1 and a glucose of 109.  Patient has no  known history of diabetes mellitus.  He denies any puncture wounds.  At the  time of his surgery in January of 2006, for his mitral valve procedure, he  underwent a tetanus injection.  He has a known history of cigarette smoking  in the past, quit 2 years ago.  He has had the hernia surgeries as  indicated.  He has had a cataract surgery on September 20, 2002.  He has had  negative colonoscopy in 2004.  He has had a left superficial parotidectomy  for a Wharton's tumor in 2004.  He has had prostate cancer by biopsy in 2004  and is status post prostatectomy.  He has had chronic obstructive pulmonary  disease and adrenal adenoma.   EXAMINATION:  Revealed a well-developed, pleasant white male.  Blood  pressure right and left arm 120/60, heart rate 64 and regular, no bruits.  He was unable to straight leg raising.  He could not turn over in bed  without assistance.   MENTAL STATUS:  He was alert  and oriented x3, followed 1, 2 and 3 step  commands.  Cranial nerve examination revealed visual fields full, disks  flat, extraocular movements full, red lens testing negative.  There was no  facial asymmetry.  Hearing was present.  Air conduction was greater than  bone conduction.  Tongue was midline.  Uvula was midline and gags were  present.  Sternocleidomastoid and trapezius testing were normal.  Slight  ptosis bilaterally.  Motor examination 5/5 strength upper and lower  extremities as best as could be tested, with the patient in severe pain.  Sensory examination intact to pinprick, light touch, general positioning,  vibration testing.  Deep tendon reflexes were 1-2+, plantar responses  downgoing.  The patient was unable to stand.   IMPRESSION:  1.  History of double vision, not seen on today's exam following the use of      Vytorin (Code [phone]).  2.  Lower back pain, consider the possibility of early diskitis verses      degenerative disk disease (Code 7244).  3.  History of mitral valve disease status post maze procedure and on      chronic Coumadin therapy (Code 4240).  4.  History of prostatic cancer (Code 185).   PLAN AT THIS TIME:  Obtain a urinalysis, sed rate, MRI with contrast to look  at the L4-5 interspace level.  I agree with cultures of the urine and would  also consider the possibility of the addition of vancomycin or needle biopsy  of the L4-5 space.      JML/MEDQ  D:  02/13/2005  T:  02/13/2005  Job:  [id number]   cc:   Titus Dubin. Alwyn Ren, M.D. [facility]

## 2011-02-15 ENCOUNTER — Ambulatory Visit (INDEPENDENT_AMBULATORY_CARE_PROVIDER_SITE_OTHER): Payer: Medicare Other | Admitting: *Deleted

## 2011-02-15 DIAGNOSIS — I4892 Unspecified atrial flutter: Secondary | ICD-10-CM

## 2011-02-15 DIAGNOSIS — Z7901 Long term (current) use of anticoagulants: Secondary | ICD-10-CM

## 2011-02-15 DIAGNOSIS — Z8679 Personal history of other diseases of the circulatory system: Secondary | ICD-10-CM

## 2011-03-02 ENCOUNTER — Encounter: Payer: Self-pay | Admitting: Pulmonary Disease

## 2011-03-04 ENCOUNTER — Other Ambulatory Visit: Payer: Self-pay | Admitting: Internal Medicine

## 2011-03-08 ENCOUNTER — Ambulatory Visit (INDEPENDENT_AMBULATORY_CARE_PROVIDER_SITE_OTHER): Payer: Medicare Other | Admitting: Pulmonary Disease

## 2011-03-08 ENCOUNTER — Encounter: Payer: Self-pay | Admitting: Pulmonary Disease

## 2011-03-08 VITALS — BP 114/58 | HR 54 | Temp 97.5°F | Ht 70.0 in | Wt 201.8 lb

## 2011-03-08 DIAGNOSIS — J438 Other emphysema: Secondary | ICD-10-CM

## 2011-03-08 MED ORDER — ALBUTEROL SULFATE HFA 108 (90 BASE) MCG/ACT IN AERS: 2.0000 | INHALATION_SPRAY | Freq: Four times a day (QID) | RESPIRATORY_TRACT | Status: DC | PRN

## 2011-03-08 NOTE — Progress Notes (Signed)
  Subjective:    Patient ID: [name], male    DOB: 05/28/1936, 75 y.o.   MRN: [id number]  HPI The pt comes in today for f/u of his known emphysema.  He uses albuterol by mdi and neb only as needed, and has never seen a benefit in LABA's.  He feels his breathing is at baseline, and denies any recent acute exacerbation or pulmonary infection.     Review of Systems  Constitutional: Negative for fever and unexpected weight change.  HENT: Negative for ear pain, nosebleeds, congestion, sore throat, rhinorrhea, sneezing, trouble swallowing, dental problem, postnasal drip and sinus pressure.   Eyes: Negative for redness and itching.  Respiratory: Positive for cough and shortness of breath. Negative for chest tightness and wheezing.   Cardiovascular: Negative for palpitations and leg swelling.  Gastrointestinal: Negative for nausea and vomiting.  Genitourinary: Negative for dysuria.  Musculoskeletal: Negative for joint swelling.  Skin: Positive for rash.  Neurological: Negative for headaches.  Hematological: Bruises/bleeds easily.  Psychiatric/Behavioral: Negative for dysphoric mood. The patient is not nervous/anxious.        Objective:   Physical Exam Ow male in nad Chest with mildly decreased bs, no wheezing Cor with rrr, 2/6 sem LE with trace edema, no cyanosis noted.  Alert and oriented, moves all 4        Assessment & Plan:

## 2011-03-08 NOTE — Patient Instructions (Signed)
No change in meds. Stay as active as possible, work on weight loss followup with me in one year.

## 2011-03-12 ENCOUNTER — Encounter: Payer: Self-pay | Admitting: Pulmonary Disease

## 2011-03-12 NOTE — Assessment & Plan Note (Signed)
The pt feels he is at a stable baseline, and I have encouraged him to stay as active as possible.  Will refill his rescue meds, and he will f/u with me in one year.

## 2011-03-15 ENCOUNTER — Ambulatory Visit (INDEPENDENT_AMBULATORY_CARE_PROVIDER_SITE_OTHER): Payer: Medicare Other | Admitting: *Deleted

## 2011-03-15 DIAGNOSIS — Z8679 Personal history of other diseases of the circulatory system: Secondary | ICD-10-CM

## 2011-03-15 DIAGNOSIS — I4892 Unspecified atrial flutter: Secondary | ICD-10-CM

## 2011-03-15 DIAGNOSIS — Z7901 Long term (current) use of anticoagulants: Secondary | ICD-10-CM

## 2011-03-15 LAB — POCT INR: INR: 2.5

## 2011-03-24 ENCOUNTER — Emergency Department (HOSPITAL_BASED_OUTPATIENT_CLINIC_OR_DEPARTMENT_OTHER)
Admission: EM | Admit: 2011-03-24 | Discharge: 2011-03-25 | Payer: Medicare Other | Source: Home / Self Care | Attending: Emergency Medicine | Admitting: Emergency Medicine

## 2011-03-24 ENCOUNTER — Other Ambulatory Visit: Payer: Self-pay

## 2011-03-25 ENCOUNTER — Inpatient Hospital Stay (HOSPITAL_COMMUNITY)
Admission: AD | Admit: 2011-03-25 | Discharge: 2011-03-25 | DRG: 069 | Disposition: A | Discharge status: Home / Self Care | Payer: Medicare Other | Source: Other Acute Inpatient Hospital | Attending: Internal Medicine | Admitting: Internal Medicine

## 2011-03-25 ENCOUNTER — Inpatient Hospital Stay (HOSPITAL_COMMUNITY): Payer: Medicare Other

## 2011-03-25 ENCOUNTER — Emergency Department (INDEPENDENT_AMBULATORY_CARE_PROVIDER_SITE_OTHER): Payer: Medicare Other

## 2011-03-25 DIAGNOSIS — J449 Chronic obstructive pulmonary disease, unspecified: Secondary | ICD-10-CM | POA: Diagnosis present

## 2011-03-25 DIAGNOSIS — H532 Diplopia: Secondary | ICD-10-CM

## 2011-03-25 DIAGNOSIS — J4489 Other specified chronic obstructive pulmonary disease: Secondary | ICD-10-CM | POA: Diagnosis present

## 2011-03-25 DIAGNOSIS — I369 Nonrheumatic tricuspid valve disorder, unspecified: Secondary | ICD-10-CM

## 2011-03-25 DIAGNOSIS — H539 Unspecified visual disturbance: Secondary | ICD-10-CM | POA: Diagnosis present

## 2011-03-25 DIAGNOSIS — E785 Hyperlipidemia, unspecified: Secondary | ICD-10-CM | POA: Diagnosis present

## 2011-03-25 DIAGNOSIS — R51 Headache: Secondary | ICD-10-CM

## 2011-03-25 DIAGNOSIS — Z7901 Long term (current) use of anticoagulants: Secondary | ICD-10-CM

## 2011-03-25 DIAGNOSIS — Z8546 Personal history of malignant neoplasm of prostate: Secondary | ICD-10-CM

## 2011-03-25 DIAGNOSIS — I4891 Unspecified atrial fibrillation: Secondary | ICD-10-CM | POA: Diagnosis present

## 2011-03-25 DIAGNOSIS — Z8673 Personal history of transient ischemic attack (TIA), and cerebral infarction without residual deficits: Secondary | ICD-10-CM

## 2011-03-25 DIAGNOSIS — G459 Transient cerebral ischemic attack, unspecified: Principal | ICD-10-CM | POA: Diagnosis present

## 2011-03-25 DIAGNOSIS — K5730 Diverticulosis of large intestine without perforation or abscess without bleeding: Secondary | ICD-10-CM | POA: Diagnosis present

## 2011-03-25 DIAGNOSIS — G319 Degenerative disease of nervous system, unspecified: Secondary | ICD-10-CM

## 2011-03-25 DIAGNOSIS — I6789 Other cerebrovascular disease: Secondary | ICD-10-CM

## 2011-03-25 DIAGNOSIS — Z87891 Personal history of nicotine dependence: Secondary | ICD-10-CM

## 2011-03-25 HISTORY — DX: Transient cerebral ischemic attack, unspecified: G45.9

## 2011-03-25 LAB — COMPREHENSIVE METABOLIC PANEL
ALT: 11 U/L (ref 0–53)
Alkaline Phosphatase: 85 U/L (ref 39–117)
BUN: 17 mg/dL (ref 6–23)
CO2: 23 mEq/L (ref 19–32)
Chloride: 104 mEq/L (ref 96–112)
GFR calc Af Amer: >60 mL/min (ref >60)
GFR calc non Af Amer: >60 mL/min (ref >60)
Glucose, Bld: 97 mg/dL (ref 70–99)
Potassium: 4.1 mEq/L (ref 3.5–5.1)
Sodium: 138 mEq/L (ref 135–145)
Total Bilirubin: 0.4 mg/dL (ref 0.3–1.2)

## 2011-03-25 LAB — BASIC METABOLIC PANEL
BUN: 21 mg/dL (ref 6–23)
Calcium: 9.5 mg/dL (ref 8.4–10.5)
GFR calc Af Amer: >60 mL/min (ref >60)
GFR calc non Af Amer: 59 mL/min — ABNORMAL LOW (ref >60)
Potassium: 3.9 mEq/L (ref 3.5–5.1)

## 2011-03-25 LAB — CBC
Hemoglobin: 15.5 g/dL (ref 13.0–17.0)
MCHC: 34.4 g/dL (ref 30.0–36.0)
MCHC: 34.4 g/dL (ref 30.0–36.0)
MCV: 89.1 fL (ref 78.0–100.0)
Platelets: 138 10*3/uL — ABNORMAL LOW (ref 150–400)
Platelets: 138 10*3/uL — ABNORMAL LOW (ref 150–400)
RDW: 13.3 % (ref 11.5–15.5)
RDW: 13.6 % (ref 11.5–15.5)
WBC: 7.1 10*3/uL (ref 4.0–10.5)

## 2011-03-25 LAB — PROTIME-INR
INR: 2.39 — ABNORMAL HIGH (ref 0.00–1.49)
INR: 2.44 — ABNORMAL HIGH (ref 0.00–1.49)
Prothrombin Time: 26.9 seconds — ABNORMAL HIGH (ref 11.6–15.2)

## 2011-03-25 LAB — MAGNESIUM: Magnesium: 2 mg/dL (ref 1.5–2.5)

## 2011-03-25 LAB — CARDIAC PANEL(CRET KIN+CKTOT+MB+TROPI): CK, MB: 2.3 ng/mL (ref 0.3–4.0)

## 2011-03-25 LAB — HEMOGLOBIN A1C
Hgb A1c MFr Bld: 6.3 % — ABNORMAL HIGH (ref <5.7)
Mean Plasma Glucose: 134 mg/dL — ABNORMAL HIGH (ref <117)

## 2011-03-25 LAB — LIPID PANEL: Cholesterol: 132 mg/dL (ref 0–200)

## 2011-04-03 NOTE — Discharge Summary (Signed)
[name], [name] NO.:  [id number]  MEDICAL RECORD NO.:  [id number]  LOCATION:  3729                         FACILITY:  [facility]  PHYSICIAN:  Jeoffrey Massed, MD    DATE OF BIRTH:  June 28, 1936  DATE OF ADMISSION:  03/25/2011 DATE OF DISCHARGE:  03/25/2011                        DISCHARGE SUMMARY - REFERRING   PRIMARY CARE PRACTITIONER:  Titus Dubin. Alwyn Ren, MD,FACP,FCCP  PRIMARY CARDIOLOGIST:  Madolyn Frieze. Jens Som, MD, [facility]  PRIMARY DISCHARGE DIAGNOSIS:  Transient diplopia, now resolved, possibly secondary to transient ischemic attack.  PAST MEDICAL HISTORY/SECONDARY DISCHARGE DIAGNOSES:  Include the following, 1. History of atrial fibrillation, on chronic Coumadin therapy. 2. History of mitral valve annuloplasty x2. 3. Prior history of endocarditis. 4. History of chronic obstructive pulmonary disease. 5. History of prostate cancer status post resection. 6. Dyslipidemia. 7. History of Warthin tumor, status post left parotidectomy. 8. History of adrenal adenoma. 9. History of hernia repair. 10.Degenerative joint disease. 11.Diverticulosis.  DISCHARGE MEDICATIONS: 1. Aspirin 81 mg 1 tablet daily. 2. Klonopin 0.5 mg 1 tablet daily p.r.n. 3. Cardizem 240 mg 1 capsule daily. 4. Simvastatin 20 mg 1 tablet daily. 5. Sulfasalazine 500 mg 2 tablets p.o. daily. 6. Coumadin one and half tablets on Sundays, Tuesdays, and Saturdays     and 1 tablet on Mondays, Wednesdays, Thursdays, and Fridays, the     dosing on this is 5 mg.  CONSULTATIONS:  Phone consultation with Dr. Thana Farr from Neurology and Dr. Antoine Poche from [facility].  BRIEF HISTORY OF PRESENT ILLNESS:  The patient is a 75 year old male with a history of atrial fibrillation, on chronic Coumadin therapy with presenting INR of 2.44 with underlying history of mitral valve repair with annuloplasty, came in with the above-noted complaints.  The patient was then admitted to the hospitalist  service for further evaluation and treatment.  PERTINENT RADIOLOGICAL STUDIES: 1. MRI of the brain showed atrophy and chronic small vessel disease.     Old cortical and subcortical infarction affecting the gyrus of the     right frontoparietal vertex. 2. MRA of the brain showed no major vessel occlusion or correctable     proximal stenosis. 3. Chest x-ray showed pulmonary edema.  Possible left pleural     effusion. 4. Carotid Doppler showed no obvious evidence of ICA stenosis. 5. Transcranial Doppler, the report of which is currently pending.  2-D echocardiogram showed the systolic function to be around 50-55%. Mitral valve is status post repair with no stenosis and only trivial regurgitation.  Annuloplasty valve ring area by pressure half time is 1.72 sq cm.  The right and the left atrium was mildly dilated.  PERTINENT LABORATORY DATA: 1. HbA1c is 6.3. 2. LDL cholesterol is 72. 3. INR on admission was 2.44.  BRIEF HOSPITAL COURSE:  Transient diplopia for around 10 minutes, that resolved on its own.  With the patient's complicated cardiac history, there was a thought whether this was a transient ischemic event.  The patient underwent a workup, which included a MRI of his brain, which was negative for any acute infarction.  MRA of his brain did not show any significant stenosis or any vascular abnormalities.  A bilateral carotid Doppler ultrasound was  negative for any significant ICA stenosis.  A 2-D echocardiogram did not show any significant issues with his mitral valve annuloplasty.  The patient's INR on presentation was 2.44.  He claims his last INR was around 2.7 a few days back.  I did discuss with Dr. Thad Ranger from Neuology on the phone and since the patient is already anticoagulated, she has no further workup to recommend.  She suggested that if the mitral valve itself was an issue, then may be consult Cardiology.  I then spoke with Dr. Antoine Poche and he was of the  opinion that if the transthoracic echo did not reveal any gross abnormality of his valve and if his INR was therapeutic, then the patient did not require any further intervention.  Dr. Antoine Poche did recommend to keep the INR between 2.5-3.5.  Since the patient is already on Coumadin with almost a therapeutic INR and since his transthoracic echo does not show any significant gross abnormality with his mitral valve, the patient is being discharged home to follow up with the patient's primary care practitioner and primary cardiologist.  It is recommended that his INR be kept between 2.5 and 3.5.  Rest of the patient's medical issues were stable.  DISPOSITION:  It is felt that the patient is stable to be discharged home.  FOLLOWUP INSTRUCTIONS: 1. The patient has been instructed to follow up with [facility] in early next week and his goal INR should be between 2.5 to     3.5. 2. The patient should follow with Dr. Jens Som and Dr. Alwyn Ren within 1     week upon discharge.  He is to call and make an appointment.  He     claims understanding.  Total time spent coordinating discharge equals 45 minutes.     Jeoffrey Massed, MD     SG/MEDQ  D:  03/25/2011  T:  03/25/2011  Job:  [id number]  Electronically Signed by Jeoffrey Massed  on 04/03/2011 11:11:57 AM

## 2011-04-05 NOTE — H&P (Signed)
[name], [name] NO.:  [id number]  MEDICAL RECORD NO.:  [id number]  LOCATION:  3729                         FACILITY:  [facility]  PHYSICIAN:  [name], MDDATE OF BIRTH:  1936/06/15  DATE OF ADMISSION:  03/25/2011 DATE OF DISCHARGE:                             HISTORY & PHYSICAL   PRIMARY CARE PHYSICIAN:  Shaun Dubin. Alwyn Ren, MD, FACP, FCCP  CHIEF COMPLAINT:  Seeing things double.  HISTORY OF PRESENT ILLNESS:  This is a 75 year old male with known history of mitral valve repair x2 with a history of endocarditis and subsequent CVA, history of hemorrhagic bronchitis, atrial fibrillation on Coumadin, COPD, hyperlipidemia has presented to the ER because of sudden onset of diplopia while at home around 5:00 p.m. last evening which lasted for just 10 minutes.  The patient did not have any associated headache or any difficulty speaking or swallowing.  Did not have any focal deficit.  Did not have any headache, chest pain, shortness of breath, nausea, vomiting, abdominal pain, dysuria, discharge, or diarrhea.  In the ER, the patient had a CT head which was negative.  At this time, the patient has been admitted for observation for further workup on TIA.  PAST MEDICAL HISTORY: 1. History of atrial fibrillation and atrial flutter with history of     cardioversion x2, is on Coumadin. 2. History of mitral valve repair x2 and also had endocarditis. 3. History of prostate cancer status post resection. 4. History of COPD. 5. History of hyperlipidemia. 6. History of Warthin's tumor status post left-sided parotidectomy. 7. History of adrenal adenoma. 8. History of hernia repair. 9. Degenerative joint disease. 10.Diverticulosis.  MEDICATIONS ON ADMISSION:  The patient is on albuterol, Atrovent, clonazepam, Coumadin, diltiazem, simvastatin, Spiriva, and sulfadiazine.  ALLERGIES:  No known drug allergies.  FAMILY HISTORY:  Mother deceased at age 4 from spinal  cancer.  Father had CVA.  SOCIAL HISTORY:  The patient quit smoking in 2001.  Denies any alcohol or drug abuse.  He is married and lives with his wife.  REVIEW OF SYSTEMS:  As present in the history of present illness, nothing else significant.  PHYSICAL EXAMINATION:  GENERAL:  The patient examined at bedside, not in acute distress. VITAL SIGNS:  Blood pressure is 120/80, pulse 78 per minute, temperature 97.5, respirations 18 per minute, and O2 sat 95%. HEENT:  Anicteric.  No pallor.  No facial asymmetry.  There is no nystagmus.  PLA positive.  No neck rigidity.  No discharge from ears, eyes, nose, or mouth. CHEST:  Bilateral air entry present.  No rhonchi.  No crepitation. HEART:  S1-S2 heard. ABDOMEN:  Soft, nontender.  Bowel sounds heard. CNS:  The patient alert, awake, and oriented to time, place, and person. He is able to move upper and lower extremities 5/5.  He has no pronator drift, discharge, akinesia, or ataxia.  Facial features as explained in the HEENT. EXTREMITIES:  Peripheral pulses felt.  No edema.  LABORATORY DATA:  EKG shows atrial fibrillation with rate around 79 beats per minute with frequent PVCs with nonspecific ST-T changes.  CT of the head without contrast shows no acute intracranial abnormality, atrophy, or chronic microvascular disease.  CBC:  WBC 7.1, hemoglobin 14.7, hematocrit 42.7, and platelets 138.  PT/INR is at 26.5 and 2.3. Basic metabolic panel:  Sodium 137, potassium 3.9, chloride 102, carbon dioxide 25, glucose 105, BUN 21, creatinine 1.2, and calcium 9.5.  ASSESSMENT: 1. Possible transient ischemic attack. 2. History of mitral valve repair x2. 3. History of atrial fibrillation, on Coumadin. 4. History of chronic obstructive pulmonary disease. 5. History of previous cigarette smoking. 6. History of hyperlipidemia. 7. History of hemorrhagic bronchitis.  PLAN: 1. At this time, admit the patient to Telemetry. 2. For his possible TIA, the  patient will be kept on neuro checks, get     MRI/MRA of the brain, get 2-D echo and carotid Doppler. 3. For his atrial fibrillation, at this time rate is controlled.  We     will continue his home medication which has to be verified.  We     will continue his Coumadin as per Pharmacy. 4. Further recommendation based on tests ordered and clinical course.     Shaun Clos, MD     ANK/MEDQ  D:  03/25/2011  T:  03/25/2011  Job:  [id number]  cc:   Shaun Dubin. Alwyn Ren, MD,FACP,FCCP  Electronically Signed by Midge Minium MD on 04/05/2011 07:31:00 AM

## 2011-04-08 ENCOUNTER — Ambulatory Visit (INDEPENDENT_AMBULATORY_CARE_PROVIDER_SITE_OTHER): Payer: Medicare Other | Admitting: *Deleted

## 2011-04-08 ENCOUNTER — Ambulatory Visit (INDEPENDENT_AMBULATORY_CARE_PROVIDER_SITE_OTHER): Payer: Medicare Other | Admitting: Internal Medicine

## 2011-04-08 ENCOUNTER — Encounter: Payer: Self-pay | Admitting: Internal Medicine

## 2011-04-08 ENCOUNTER — Encounter: Payer: Medicare Other | Admitting: *Deleted

## 2011-04-08 VITALS — BP 124/68 | HR 60 | Ht 70.0 in | Wt 200.0 lb

## 2011-04-08 DIAGNOSIS — D696 Thrombocytopenia, unspecified: Secondary | ICD-10-CM

## 2011-04-08 DIAGNOSIS — Z8679 Personal history of other diseases of the circulatory system: Secondary | ICD-10-CM

## 2011-04-08 DIAGNOSIS — K5150 Left sided colitis without complications: Secondary | ICD-10-CM

## 2011-04-08 DIAGNOSIS — K5289 Other specified noninfective gastroenteritis and colitis: Secondary | ICD-10-CM

## 2011-04-08 DIAGNOSIS — H532 Diplopia: Secondary | ICD-10-CM

## 2011-04-08 DIAGNOSIS — I4892 Unspecified atrial flutter: Secondary | ICD-10-CM

## 2011-04-08 DIAGNOSIS — Z7901 Long term (current) use of anticoagulants: Secondary | ICD-10-CM

## 2011-04-08 MED ORDER — SULFASALAZINE 500 MG PO TABS: 500.0000 mg | ORAL_TABLET | Freq: Two times a day (BID) | ORAL | Status: DC

## 2011-04-08 NOTE — Progress Notes (Signed)
  Subjective:    Patient ID: [name], male    DOB: [birth year], 75 y.o.   MRN: [id number]  HPI A 75 year old white man followed for segmental left-sided ulcerative colitis in the setting of diverticulosis. He has been maintained on sulfasalazine after a sick almost too expensive. He is now taking 500 mg twice a day which is low dose. He denies any diarrhea abdominal pain or rectal bleeding. He is not sure he is on ferrous sulfate at this time.   Review of Systems Recent hospitalization, for what he says was a mini stroke question TIA. He spent about a day in the hospital.    Objective:   Physical Exam Well-developed elderly white man no acute distress Lungs clear Heart S1-S2 no rubs murmurs or gallops there is a coronary bypass grafting scar Abdomen is obese protuberant soft and nontender. There is an umbilical hernia that is easily reducible. He complains of an inguinal hernia but I cannot find it well he is supine and sitting.       Assessment & Plan:

## 2011-04-08 NOTE — Patient Instructions (Signed)
Please continue your sulfasalazine to treat your ulcerative colitis. If you develop diarrhea problems please call Dr. Marvell Fuller office for advice or schedule an appointment.

## 2011-04-08 NOTE — Assessment & Plan Note (Signed)
Low-dose sulfasalazine seems to be working so will continue.

## 2011-04-11 ENCOUNTER — Ambulatory Visit (INDEPENDENT_AMBULATORY_CARE_PROVIDER_SITE_OTHER): Payer: Medicare Other | Admitting: Internal Medicine

## 2011-04-11 DIAGNOSIS — Z7901 Long term (current) use of anticoagulants: Secondary | ICD-10-CM

## 2011-04-11 DIAGNOSIS — R2689 Other abnormalities of gait and mobility: Secondary | ICD-10-CM

## 2011-04-11 DIAGNOSIS — G459 Transient cerebral ischemic attack, unspecified: Secondary | ICD-10-CM

## 2011-04-11 DIAGNOSIS — R269 Unspecified abnormalities of gait and mobility: Secondary | ICD-10-CM

## 2011-04-11 DIAGNOSIS — H532 Diplopia: Secondary | ICD-10-CM

## 2011-04-11 DIAGNOSIS — R350 Frequency of micturition: Secondary | ICD-10-CM

## 2011-04-11 DIAGNOSIS — I4892 Unspecified atrial flutter: Secondary | ICD-10-CM

## 2011-04-11 LAB — POCT URINALYSIS DIPSTICK
Ketones, UA: NEGATIVE
Leukocytes, UA: NEGATIVE
Protein, UA: NEGATIVE
Urobilinogen, UA: NEGATIVE
pH, UA: 6.5

## 2011-04-11 NOTE — Progress Notes (Signed)
  Subjective:    Patient ID: [name], male    DOB: [birth year], 75 y.o.   MRN: [id number]  HPI  He was hospitalized for observation 7/6/ 2012. He declined further evaluation as the symptoms had resolved. It was felt he had a TIA which presented as diplopia for 5-10 minutes. Back has resolved but he has residual dizziness. He also feels his gait is unstable; he has not fallen.    Review of Systems Dizziness: Onset:2-3 years ago Context: Position change:no, can occur even sitting but especially with quick movements* Benign positional vertigo symptoms:no Straining:no Pain:no Cardiac prodrome: Palpitations, irregular rhythm, heart rate change:no Neurologic prodrome: headache, numbness and tingling, weakness;no Syncope:no Seizure activity:no Duration:seconds Frequency:as noted* Associated signs and symptoms: Hearing loss/tinnitus:no Nausea/sweating:no Chest pain:no Dyspnea:no but he has COPD with DOE Treatment/response:no treatment  Intermittent frequency & nocturia ; no hematuria, pyuria or dysuria.      Objective:   Physical Exam   Gen. appearance: Well-nourished, in no distress Eyes: Extraocular motion intact, field of vision normal, vision grossly intact, no nystagmus. Ptosis OD > OS ENT: Canals clear, tympanic membranes normal, tuning fork exam normal, hearing grossly normal Neck: Normal range of motion, no masses, normal thyroid ( largely substernal) Cardiovascular: Rate and rhythm normal; no murmurs, gallops or extra heart sounds. S4 Muscle skeletal: Range of motion, tone, &  strength normal Neuro:no cranial nerve deficit except decreased R nasolabial fold, deep tendon  reflexes normal, gait  Slow & methodical, slightly unsteady Lymph: No cervical or axillary LA Skin: Warm and dry without suspicious lesions or rashes Psych: no anxiety or mood change. Normally interactive and cooperative.         Assessment & Plan:  #1 apparent TIA presenting as diplopia  #2  balance dysfunction, chronic  #3 chronic warfarin therapy; any falls could be catastrophic  #4 frequency and nocturia, intermittent  Plan: #1 ophthalmologic assessment of the diplopia and ptosis  #2 I strongly encouraged physical therapy evaluation of his gait to minimize risk of falling in the context of taking Coumadin.  #3 dip urine; if normal he should followup with his urologist Dr. Retta Diones

## 2011-04-11 NOTE — Patient Instructions (Addendum)
Please share records with all doctors seen. Your dip urine was normal; please see Dr Retta Diones to assess the frequent urination

## 2011-04-12 ENCOUNTER — Encounter: Payer: Medicare Other | Admitting: *Deleted

## 2011-04-17 ENCOUNTER — Encounter: Payer: Self-pay | Admitting: Internal Medicine

## 2011-04-17 NOTE — Assessment & Plan Note (Signed)
Seems to be ok. Continue low-dose sulfasalazine. May refill within 1 year - need to see that CBC ok over time on this drug.

## 2011-04-27 ENCOUNTER — Ambulatory Visit: Payer: Medicare Other | Attending: Internal Medicine | Admitting: Physical Therapy

## 2011-04-27 DIAGNOSIS — R262 Difficulty in walking, not elsewhere classified: Secondary | ICD-10-CM | POA: Insufficient documentation

## 2011-04-27 DIAGNOSIS — IMO0001 Reserved for inherently not codable concepts without codable children: Secondary | ICD-10-CM | POA: Insufficient documentation

## 2011-04-28 ENCOUNTER — Ambulatory Visit: Payer: Medicare Other | Admitting: Physical Therapy

## 2011-05-04 ENCOUNTER — Ambulatory Visit: Payer: Medicare Other | Admitting: Physical Therapy

## 2011-05-06 ENCOUNTER — Ambulatory Visit (INDEPENDENT_AMBULATORY_CARE_PROVIDER_SITE_OTHER): Payer: Medicare Other | Admitting: *Deleted

## 2011-05-06 ENCOUNTER — Ambulatory Visit: Payer: Medicare Other | Admitting: Physical Therapy

## 2011-05-06 DIAGNOSIS — I4892 Unspecified atrial flutter: Secondary | ICD-10-CM

## 2011-05-06 DIAGNOSIS — Z7901 Long term (current) use of anticoagulants: Secondary | ICD-10-CM

## 2011-05-06 DIAGNOSIS — Z8679 Personal history of other diseases of the circulatory system: Secondary | ICD-10-CM

## 2011-05-06 LAB — POCT INR: INR: 3.3

## 2011-05-10 ENCOUNTER — Ambulatory Visit: Payer: Medicare Other | Admitting: Physical Therapy

## 2011-05-13 ENCOUNTER — Encounter: Payer: Medicare Other | Admitting: Physical Therapy

## 2011-05-16 ENCOUNTER — Encounter: Payer: Medicare Other | Admitting: Physical Therapy

## 2011-05-19 ENCOUNTER — Encounter: Payer: Medicare Other | Admitting: Physical Therapy

## 2011-05-24 ENCOUNTER — Encounter: Payer: Medicare Other | Admitting: Physical Therapy

## 2011-05-26 ENCOUNTER — Encounter: Payer: Medicare Other | Admitting: Physical Therapy

## 2011-06-03 ENCOUNTER — Ambulatory Visit (INDEPENDENT_AMBULATORY_CARE_PROVIDER_SITE_OTHER): Payer: Medicare Other | Admitting: *Deleted

## 2011-06-03 DIAGNOSIS — Z7901 Long term (current) use of anticoagulants: Secondary | ICD-10-CM

## 2011-06-03 DIAGNOSIS — Z8679 Personal history of other diseases of the circulatory system: Secondary | ICD-10-CM

## 2011-06-03 DIAGNOSIS — I4892 Unspecified atrial flutter: Secondary | ICD-10-CM

## 2011-06-13 LAB — POCT CARDIAC MARKERS
CKMB, poc: 1.1
Troponin i, poc: <0.05

## 2011-06-13 LAB — CARDIAC PANEL(CRET KIN+CKTOT+MB+TROPI)
CK, MB: 3
Relative Index: INVALID
Total CK: 90
Total CK: 95

## 2011-06-13 LAB — PROTIME-INR
INR: 1.1
INR: 1.5
INR: 1.6 — ABNORMAL HIGH
INR: 1.7 — ABNORMAL HIGH
INR: 2.4 — ABNORMAL HIGH
INR: 2.7 — ABNORMAL HIGH
INR: 3.1 — ABNORMAL HIGH
Prothrombin Time: 18 — ABNORMAL HIGH
Prothrombin Time: 19 — ABNORMAL HIGH
Prothrombin Time: 26.5 — ABNORMAL HIGH
Prothrombin Time: 29.8 — ABNORMAL HIGH

## 2011-06-13 LAB — BASIC METABOLIC PANEL
CO2: 22
CO2: 30
Calcium: 8.7
Calcium: 8.8
Calcium: 9
Calcium: 9.1
Chloride: 103
Chloride: 106
Chloride: 108
Creatinine, Ser: 1.36
Creatinine, Ser: 1.53 — ABNORMAL HIGH
GFR calc Af Amer: 54 — ABNORMAL LOW
GFR calc Af Amer: 55 — ABNORMAL LOW
GFR calc Af Amer: >60
GFR calc Af Amer: >60
GFR calc non Af Amer: 44 — ABNORMAL LOW
GFR calc non Af Amer: 59 — ABNORMAL LOW
Glucose, Bld: 130 — ABNORMAL HIGH
Glucose, Bld: 173 — ABNORMAL HIGH
Potassium: 4.2
Potassium: 4.5
Potassium: 4.6
Sodium: 133 — ABNORMAL LOW
Sodium: 135
Sodium: 136
Sodium: 137
Sodium: 138

## 2011-06-13 LAB — CBC
HCT: 36.1 — ABNORMAL LOW
HCT: 36.8 — ABNORMAL LOW
HCT: 38.5 — ABNORMAL LOW
Hemoglobin: 12.3 — ABNORMAL LOW
Hemoglobin: 12.4 — ABNORMAL LOW
Hemoglobin: 12.5 — ABNORMAL LOW
Hemoglobin: 13.5
Hemoglobin: 14.6
Hemoglobin: 14.9
MCHC: 34.1
MCHC: 34.5
MCHC: 35
MCV: 87.7
MCV: 87.8
MCV: 88.1
RBC: 4.04 — ABNORMAL LOW
RBC: 4.15 — ABNORMAL LOW
RBC: 4.81
RBC: 4.9
RDW: 13.4
RDW: 14.1
RDW: 14.1
WBC: 11.7 — ABNORMAL HIGH
WBC: 11.9 — ABNORMAL HIGH
WBC: 13.3 — ABNORMAL HIGH

## 2011-06-13 LAB — DIFFERENTIAL
Basophils Absolute: 0
Basophils Absolute: 0
Basophils Relative: 0
Basophils Relative: 0
Eosinophils Absolute: 0
Eosinophils Absolute: 0
Eosinophils Relative: 0
Monocytes Absolute: 0.2
Monocytes Absolute: 0.6
Monocytes Relative: 9
Neutro Abs: 5.3
Neutro Abs: 7.6
Neutrophils Relative %: 88 — ABNORMAL HIGH

## 2011-06-13 LAB — URINE CULTURE
Colony Count: NO GROWTH
Culture: NO GROWTH

## 2011-06-13 LAB — CULTURE, BLOOD (ROUTINE X 2): Culture: NO GROWTH

## 2011-06-13 LAB — FUNGUS CULTURE W SMEAR

## 2011-06-13 LAB — URINALYSIS, ROUTINE W REFLEX MICROSCOPIC
Glucose, UA: 100 — AB
Ketones, ur: NEGATIVE
Leukocytes, UA: NEGATIVE
Nitrite: NEGATIVE
Specific Gravity, Urine: 1.029
pH: 5

## 2011-06-13 LAB — AFB CULTURE WITH SMEAR (NOT AT ARMC): Acid Fast Smear: NONE SEEN

## 2011-06-13 LAB — COMPREHENSIVE METABOLIC PANEL
ALT: 16
Alkaline Phosphatase: 49
BUN: 21
Chloride: 105
Glucose, Bld: 186 — ABNORMAL HIGH
Potassium: 4.2
Sodium: 137
Total Bilirubin: 1.3 — ABNORMAL HIGH

## 2011-06-13 LAB — BLOOD GAS, ARTERIAL
Acid-base deficit: 2.1 — ABNORMAL HIGH
Bicarbonate: 21
FIO2: 0.21
O2 Saturation: 95.6
Patient temperature: 98.6
TCO2: 18.5

## 2011-06-13 LAB — CREATININE, URINE, RANDOM: Creatinine, Urine: 241

## 2011-06-13 LAB — URINE MICROSCOPIC-ADD ON

## 2011-06-13 LAB — TSH: TSH: 1.307

## 2011-06-13 LAB — SODIUM, URINE, RANDOM: Sodium, Ur: 20

## 2011-06-13 LAB — CULTURE, RESPIRATORY W GRAM STAIN: Culture: NO GROWTH

## 2011-06-13 LAB — LEGIONELLA ANTIGEN, URINE: Legionella Antigen, Urine: NEGATIVE

## 2011-06-14 LAB — PROTIME-INR
INR: 1.8 — ABNORMAL HIGH
INR: 4.2 — ABNORMAL HIGH
INR: 1.6 — ABNORMAL HIGH
Prothrombin Time: 19 — ABNORMAL HIGH

## 2011-06-14 LAB — TYPE AND SCREEN: Antibody Screen: NEGATIVE

## 2011-06-14 LAB — BASIC METABOLIC PANEL
BUN: 10
Calcium: 9.3
Chloride: 107
Creatinine, Ser: 1.33
GFR calc Af Amer: >60
GFR calc non Af Amer: 53 — ABNORMAL LOW
Glucose, Bld: 106 — ABNORMAL HIGH
Glucose, Bld: 97
Potassium: 4
Potassium: 3.7
Sodium: 140
Sodium: 138

## 2011-06-14 LAB — CBC
HCT: 34.9 — ABNORMAL LOW
Hemoglobin: 11.9 — ABNORMAL LOW
Hemoglobin: 12 — ABNORMAL LOW
MCHC: 34.2
MCV: 88.4
Platelets: 104 — ABNORMAL LOW
RDW: 14.2
RDW: 14.4
RDW: 13.9
WBC: 3.6 — ABNORMAL LOW

## 2011-06-14 LAB — PREPARE FRESH FROZEN PLASMA

## 2011-06-14 LAB — APTT
aPTT: 46 — ABNORMAL HIGH
aPTT: 68 — ABNORMAL HIGH

## 2011-06-14 LAB — HEMOGLOBIN AND HEMATOCRIT, BLOOD: Hemoglobin: 11.5 — ABNORMAL LOW

## 2011-07-01 ENCOUNTER — Ambulatory Visit (INDEPENDENT_AMBULATORY_CARE_PROVIDER_SITE_OTHER): Payer: Medicare Other | Admitting: *Deleted

## 2011-07-01 DIAGNOSIS — Z7901 Long term (current) use of anticoagulants: Secondary | ICD-10-CM

## 2011-07-01 DIAGNOSIS — I4892 Unspecified atrial flutter: Secondary | ICD-10-CM

## 2011-07-01 DIAGNOSIS — Z8679 Personal history of other diseases of the circulatory system: Secondary | ICD-10-CM

## 2011-07-25 ENCOUNTER — Encounter: Payer: Self-pay | Admitting: Cardiology

## 2011-07-25 ENCOUNTER — Ambulatory Visit (INDEPENDENT_AMBULATORY_CARE_PROVIDER_SITE_OTHER): Payer: Medicare Other | Admitting: *Deleted

## 2011-07-25 ENCOUNTER — Ambulatory Visit (INDEPENDENT_AMBULATORY_CARE_PROVIDER_SITE_OTHER): Payer: Medicare Other | Admitting: Cardiology

## 2011-07-25 VITALS — BP 145/79 | HR 87 | Ht 69.0 in | Wt 206.0 lb

## 2011-07-25 DIAGNOSIS — I4892 Unspecified atrial flutter: Secondary | ICD-10-CM

## 2011-07-25 DIAGNOSIS — I4891 Unspecified atrial fibrillation: Secondary | ICD-10-CM | POA: Insufficient documentation

## 2011-07-25 DIAGNOSIS — I10 Essential (primary) hypertension: Secondary | ICD-10-CM

## 2011-07-25 DIAGNOSIS — Z9889 Other specified postprocedural states: Secondary | ICD-10-CM

## 2011-07-25 DIAGNOSIS — R0600 Dyspnea, unspecified: Secondary | ICD-10-CM | POA: Insufficient documentation

## 2011-07-25 DIAGNOSIS — E7800 Pure hypercholesterolemia, unspecified: Secondary | ICD-10-CM

## 2011-07-25 DIAGNOSIS — Z8679 Personal history of other diseases of the circulatory system: Secondary | ICD-10-CM

## 2011-07-25 DIAGNOSIS — I2510 Atherosclerotic heart disease of native coronary artery without angina pectoris: Secondary | ICD-10-CM

## 2011-07-25 DIAGNOSIS — Z7901 Long term (current) use of anticoagulants: Secondary | ICD-10-CM

## 2011-07-25 LAB — BASIC METABOLIC PANEL
CO2: 27 mEq/L (ref 19–32)
Calcium: 9.3 mg/dL (ref 8.4–10.5)
Chloride: 103 mEq/L (ref 96–112)
Creatinine, Ser: 1.3 mg/dL (ref 0.4–1.5)
Glucose, Bld: 102 mg/dL — ABNORMAL HIGH (ref 70–99)

## 2011-07-25 LAB — POCT INR: INR: 2.4

## 2011-07-25 MED ORDER — DILTIAZEM HCL ER COATED BEADS 240 MG PO CP24: 240.0000 mg | ORAL_CAPSULE | Freq: Every day | ORAL | Status: DC

## 2011-07-25 MED ORDER — SIMVASTATIN 20 MG PO TABS: 20.0000 mg | ORAL_TABLET | Freq: Every day | ORAL | Status: DC

## 2011-07-25 NOTE — Progress Notes (Signed)
HPI:Mr. [name] is a  gentleman who has a history of mitral valve repair as well as atrial fibrillation/flutter.  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  He also has a history of a GI bleed as well as hemorrhagic bronchitis. Note cardiac catheterization in December of 2006 showed a 40-50% stenosis in the mid vessel but was otherwise without coronary disease. Last echocardiogram was performed in December of 2011 and showed an ejection fraction of 55%, moderate left atrial enlargement, mild right atrial and right ventricular enlargement, status post mitral valve repair with mild to moderate mitral stenosis and  trace aortic insufficiency. I last saw him in Nov 2011. Patient was admitted in July 2012 with diplopia. Echocardiogram showed normal LV function, mild mitral stenosis and mild biatrial enlargement. Carotid Dopplers negative. His symptoms resolved. He continues to have dyspnea with more moderate activities. No orthopnea, PND or pedal edema.  Current Outpatient Prescriptions  Medication Sig Dispense Refill  . albuterol (PROAIR HFA) 108 (90 BASE) MCG/ACT inhaler Inhale 2 puffs into the lungs every 6 (six) hours as needed.  1 Inhaler  6  . albuterol (PROVENTIL) (2.5 MG/3ML) 0.083% nebulizer solution Take 2.5 mg by nebulization every 6 (six) hours as needed.       . clonazePAM (KLONOPIN) 0.5 MG tablet Take 0.5 mg by mouth 2 (two) times daily as needed.        . diltiazem (CARDIZEM CD) 240 MG 24 hr capsule Take 1 capsule by mouth daily.      . ferrous sulfate 325 (65 FE) MG tablet Take 325 mg by mouth daily with breakfast.        . mesalamine (ASACOL) 400 MG EC tablet Take 400 mg by mouth 2 (two) times daily.        . simvastatin (ZOCOR) 20 MG tablet Take 1 tablet by mouth At bedtime.      Marland Kitchen warfarin (COUMADIN) 5 MG tablet Take by mouth as directed.           Past Medical History  Diagnosis Date  . Mediastinal  lymphadenopathy   . History of prostate cancer   . Atrial fibrillation   . COPD (chronic obstructive pulmonary disease)   . CAD (coronary artery disease)   . Adrenal adenoma   . Warthin's tumor   . Dyslipidemia   . Diverticulosis   . Embolic stroke     d/t endocarditis 2008  . Atrial flutter   . Ulcerative colitis     left-sided, associated with diverticulosis  . Obstructive chronic bronchitis   . HTN (hypertension)   . Thrombocytopenia   . Emphysema   . Right thyroid nodule   . Hemorrhoids   . TIA (transient ischemic attack) 03/25/2011    ? of due to transient diplopia    Past Surgical History  Procedure Date  . Mitral valve repair 09/22/04    and modified Cox-Maze IV   . Mitral valve repair 09/14/05    redo MV repair d/t endocardiits/embolic events  . Prostatectomy   . Inguinal hernia repair     right  . Cataract extraction   . Salivary gland surgery     left resection  . Flexible sigmoidoscopy 01/11/2008; 03/19/2010    2009 and 2011:segmental left colitis, diverticulosis, hemorrhoids  . Colonoscopy w/ biopsies 12/19/2007    left-sided colitis, diverticulosis, hemorrhoids    History   Social History  . Marital Status: Married    Spouse  Name: N/A    Number of Children: N/A  . Years of Education: N/A   Occupational History  . RETIRED     [employer]    Social History Main Topics  . Smoking status: Former Smoker -- 3.0 packs/day for 60 years    Types: Cigarettes    Quit date: 09/19/2002  . Smokeless tobacco: Never Used  . Alcohol Use: No  . Drug Use: No  . Sexually Active: Not on file   Other Topics Concern  . Not on file   Social History Narrative   Pt has children    ROS: no fevers or chills, productive cough, hemoptysis, dysphasia, odynophagia, melena, hematochezia, dysuria, hematuria, rash, seizure activity, orthopnea, PND, pedal edema, claudication. Remaining systems are negative.  Physical Exam: Well-developed well-nourished in no acute distress.  Skin  is warm and dry.  HEENT is normal.  Neck is supple. No thyromegaly.  Chest is clear to auscultation with normal expansion.  Cardiovascular exam is irregular Abdominal exam nontender or distended. No masses palpated. Extremities show no edema. neuro grossly intact  ECG atrial fibrillation with PVCs or aberrantly conducted beats. Prior inferior infarct.

## 2011-07-25 NOTE — Assessment & Plan Note (Signed)
Blood pressure controlled. Continue present medications. 

## 2011-07-25 NOTE — Assessment & Plan Note (Signed)
Continue present medications. 

## 2011-07-25 NOTE — Assessment & Plan Note (Signed)
Continued SBE prophylaxis. 

## 2011-07-25 NOTE — Assessment & Plan Note (Signed)
Continue statin. 

## 2011-07-25 NOTE — Patient Instructions (Addendum)
Your physician wants you to follow-up in: ONE YEAR  You will receive a reminder letter in the mail two months in advance. If you don't receive a letter, please call our office to schedule the follow-up appointment.   Your physician recommends that you return for lab work in: TODAY  

## 2011-07-25 NOTE — Assessment & Plan Note (Signed)
Check BNP 

## 2011-07-25 NOTE — Assessment & Plan Note (Signed)
Continue Cardizem for rate control. Continue Coumadin with goal INR 2-3.

## 2011-07-26 ENCOUNTER — Encounter: Payer: Self-pay | Admitting: Internal Medicine

## 2011-07-26 ENCOUNTER — Ambulatory Visit (INDEPENDENT_AMBULATORY_CARE_PROVIDER_SITE_OTHER): Payer: Medicare Other | Admitting: Internal Medicine

## 2011-07-26 VITALS — BP 142/83 | HR 58 | Temp 98.7°F | Resp 18 | Ht 68.0 in | Wt 204.4 lb

## 2011-07-26 DIAGNOSIS — L299 Pruritus, unspecified: Secondary | ICD-10-CM

## 2011-07-26 DIAGNOSIS — J441 Chronic obstructive pulmonary disease with (acute) exacerbation: Secondary | ICD-10-CM

## 2011-07-26 MED ORDER — ALBUTEROL SULFATE HFA 108 (90 BASE) MCG/ACT IN AERS: 2.0000 | INHALATION_SPRAY | Freq: Four times a day (QID) | RESPIRATORY_TRACT | Status: DC | PRN

## 2011-07-26 NOTE — Patient Instructions (Signed)
Rest, fluids , tylenol For cough, take Mucinex DM twice a day as needed  If the cough continue or you are wheezing: use albuterol 2 puffs 4 times a day (see sample and  The prescription) Call if no better in few days Call anytime if the symptoms are severe, you have high fever, short of breath  Use Selsun shampoo once or twice a week

## 2011-07-26 NOTE — Progress Notes (Signed)
  Subjective:    Patient ID: [name], male    DOB: [birth year], 75 y.o.   MRN: [id number]  HPI Here with his wife, one-day history of chest congestion and mild sore throat.  Past medical history: Reviewed Medication list: Reviewed  Review of Systems No fever or chills Some sinus pain but no sinus congestion, no nasal discharge. Cough  producing white sputum. Currently, he has no albuterol at hand and has not been using it.     Objective:   Physical Exam  Constitutional: He appears well-developed and well-nourished. No distress.  HENT:  Head: Normocephalic.  Right Ear: External ear normal.  Left Ear: External ear normal.  Nose: Nose normal.  Mouth/Throat: No oropharyngeal exudate.  Cardiovascular:       Irregular  Pulmonary/Chest:       Slightly decreased breath sounds, no increased work of breathing, few end expiratory wheezing, no crackles.  Skin: He is not diaphoretic.          Assessment & Plan:  Mild COPD exacerbation: Symptoms  started yesterday, conservative treatment for now. I encouraged him to call me in 3 days, if he's not better will probably need an antibiotic. Itchy, dry scalp: He also c/o Itchy, dry scalp, exam confirmed the dry scalp. Has used several different shampoos. Recommend to use only baby shampoo 3 or 4 times a week and Selsun once weekly.

## 2011-08-18 ENCOUNTER — Other Ambulatory Visit: Payer: Self-pay | Admitting: Cardiology

## 2011-09-05 ENCOUNTER — Ambulatory Visit (INDEPENDENT_AMBULATORY_CARE_PROVIDER_SITE_OTHER): Payer: Medicare Other | Admitting: *Deleted

## 2011-09-05 DIAGNOSIS — I4892 Unspecified atrial flutter: Secondary | ICD-10-CM

## 2011-09-05 DIAGNOSIS — Z8679 Personal history of other diseases of the circulatory system: Secondary | ICD-10-CM

## 2011-09-05 DIAGNOSIS — Z7901 Long term (current) use of anticoagulants: Secondary | ICD-10-CM

## 2011-10-17 ENCOUNTER — Ambulatory Visit (INDEPENDENT_AMBULATORY_CARE_PROVIDER_SITE_OTHER): Payer: Medicare Other

## 2011-10-17 DIAGNOSIS — Z8679 Personal history of other diseases of the circulatory system: Secondary | ICD-10-CM | POA: Diagnosis not present

## 2011-10-17 DIAGNOSIS — Z7901 Long term (current) use of anticoagulants: Secondary | ICD-10-CM

## 2011-10-17 DIAGNOSIS — I4892 Unspecified atrial flutter: Secondary | ICD-10-CM

## 2011-10-17 LAB — POCT INR: INR: 2.9

## 2011-11-09 DIAGNOSIS — M76899 Other specified enthesopathies of unspecified lower limb, excluding foot: Secondary | ICD-10-CM | POA: Diagnosis not present

## 2011-11-09 DIAGNOSIS — M545 Low back pain: Secondary | ICD-10-CM | POA: Diagnosis not present

## 2011-11-11 ENCOUNTER — Telehealth: Payer: Self-pay | Admitting: Pharmacist

## 2011-11-11 MED ORDER — ENOXAPARIN SODIUM 100 MG/ML ~~LOC~~ SOLN: 100.0000 mg | Freq: Two times a day (BID) | SUBCUTANEOUS | Status: DC

## 2011-11-11 NOTE — Telephone Encounter (Signed)
Message copied by Velda Shell on Fri Nov 11, 2011  3:03 PM ------      Message from: Lewayne Bunting      Created: Thu Nov 10, 2011  5:10 PM       Yes      Olga Millers            ----- Message -----         From: Mariane Masters, PHARMD         Sent: 11/10/2011   4:34 PM           To: Lewayne Bunting, MD            Pt scheduled to have epidural injection next week and needs to hold Coumadin starting tomorrow.  Has a history of CVA.  Would you like [place] to bridge him?  Thanks.

## 2011-11-11 NOTE — Telephone Encounter (Signed)
Reviewed pt's lab.  Weight- 92.7kg.  SCr- 1.3.  CrCl- 11mL/min  2/22- No Coumadin  2/23- Lovenox 100mg  in AM and PM 2/24- Lovenox 100mg  in AM and PM 2/25- Lovenox 100mg  in AM and PM 2/26- Lovenox 100mg  in AM only 2/27- Day of Procedure.  Take 2 tablets of Coumadin  2/28- Restart Lovenox 100mg  BID and normal dose of Coumadin 3/4- Recheck INR.

## 2011-11-16 DIAGNOSIS — I6789 Other cerebrovascular disease: Secondary | ICD-10-CM | POA: Diagnosis not present

## 2011-11-16 DIAGNOSIS — M545 Low back pain: Secondary | ICD-10-CM | POA: Diagnosis not present

## 2011-11-16 DIAGNOSIS — Z7901 Long term (current) use of anticoagulants: Secondary | ICD-10-CM | POA: Diagnosis not present

## 2011-11-21 ENCOUNTER — Ambulatory Visit (INDEPENDENT_AMBULATORY_CARE_PROVIDER_SITE_OTHER): Payer: Medicare Other | Admitting: *Deleted

## 2011-11-21 DIAGNOSIS — I4892 Unspecified atrial flutter: Secondary | ICD-10-CM

## 2011-11-21 DIAGNOSIS — Z7901 Long term (current) use of anticoagulants: Secondary | ICD-10-CM

## 2011-11-21 DIAGNOSIS — Z8679 Personal history of other diseases of the circulatory system: Secondary | ICD-10-CM | POA: Diagnosis not present

## 2011-11-28 ENCOUNTER — Ambulatory Visit (INDEPENDENT_AMBULATORY_CARE_PROVIDER_SITE_OTHER): Payer: Medicare Other | Admitting: *Deleted

## 2011-11-28 DIAGNOSIS — I4892 Unspecified atrial flutter: Secondary | ICD-10-CM | POA: Diagnosis not present

## 2011-11-28 DIAGNOSIS — Z7901 Long term (current) use of anticoagulants: Secondary | ICD-10-CM

## 2011-11-28 DIAGNOSIS — Z8679 Personal history of other diseases of the circulatory system: Secondary | ICD-10-CM

## 2011-12-26 ENCOUNTER — Ambulatory Visit (INDEPENDENT_AMBULATORY_CARE_PROVIDER_SITE_OTHER): Payer: Medicare Other

## 2011-12-26 DIAGNOSIS — I4892 Unspecified atrial flutter: Secondary | ICD-10-CM

## 2011-12-26 DIAGNOSIS — Z8679 Personal history of other diseases of the circulatory system: Secondary | ICD-10-CM

## 2011-12-26 DIAGNOSIS — Z7901 Long term (current) use of anticoagulants: Secondary | ICD-10-CM

## 2011-12-26 LAB — POCT INR: INR: 2.6

## 2011-12-27 DIAGNOSIS — M76899 Other specified enthesopathies of unspecified lower limb, excluding foot: Secondary | ICD-10-CM | POA: Diagnosis not present

## 2012-01-20 ENCOUNTER — Other Ambulatory Visit: Payer: Self-pay | Admitting: Cardiology

## 2012-01-21 ENCOUNTER — Emergency Department (INDEPENDENT_AMBULATORY_CARE_PROVIDER_SITE_OTHER): Payer: Medicare Other

## 2012-01-21 ENCOUNTER — Encounter (HOSPITAL_BASED_OUTPATIENT_CLINIC_OR_DEPARTMENT_OTHER): Payer: Self-pay | Admitting: Emergency Medicine

## 2012-01-21 ENCOUNTER — Emergency Department (HOSPITAL_BASED_OUTPATIENT_CLINIC_OR_DEPARTMENT_OTHER)
Admission: EM | Admit: 2012-01-21 | Discharge: 2012-01-21 | Disposition: A | Discharge status: Home / Self Care | Payer: Medicare Other | Attending: Emergency Medicine | Admitting: Emergency Medicine

## 2012-01-21 DIAGNOSIS — I4891 Unspecified atrial fibrillation: Secondary | ICD-10-CM | POA: Diagnosis not present

## 2012-01-21 DIAGNOSIS — S0003XA Contusion of scalp, initial encounter: Secondary | ICD-10-CM | POA: Diagnosis not present

## 2012-01-21 DIAGNOSIS — J438 Other emphysema: Secondary | ICD-10-CM | POA: Diagnosis not present

## 2012-01-21 DIAGNOSIS — S0990XA Unspecified injury of head, initial encounter: Secondary | ICD-10-CM | POA: Insufficient documentation

## 2012-01-21 DIAGNOSIS — W1809XA Striking against other object with subsequent fall, initial encounter: Secondary | ICD-10-CM | POA: Insufficient documentation

## 2012-01-21 DIAGNOSIS — Z8673 Personal history of transient ischemic attack (TIA), and cerebral infarction without residual deficits: Secondary | ICD-10-CM | POA: Diagnosis not present

## 2012-01-21 DIAGNOSIS — Z79899 Other long term (current) drug therapy: Secondary | ICD-10-CM | POA: Diagnosis not present

## 2012-01-21 DIAGNOSIS — R918 Other nonspecific abnormal finding of lung field: Secondary | ICD-10-CM

## 2012-01-21 DIAGNOSIS — R079 Chest pain, unspecified: Secondary | ICD-10-CM | POA: Diagnosis not present

## 2012-01-21 DIAGNOSIS — S20219A Contusion of unspecified front wall of thorax, initial encounter: Secondary | ICD-10-CM | POA: Diagnosis not present

## 2012-01-21 DIAGNOSIS — R51 Headache: Secondary | ICD-10-CM | POA: Diagnosis not present

## 2012-01-21 DIAGNOSIS — Z7901 Long term (current) use of anticoagulants: Secondary | ICD-10-CM | POA: Insufficient documentation

## 2012-01-21 DIAGNOSIS — W19XXXA Unspecified fall, initial encounter: Secondary | ICD-10-CM

## 2012-01-21 DIAGNOSIS — E785 Hyperlipidemia, unspecified: Secondary | ICD-10-CM | POA: Insufficient documentation

## 2012-01-21 DIAGNOSIS — I10 Essential (primary) hypertension: Secondary | ICD-10-CM | POA: Insufficient documentation

## 2012-01-21 DIAGNOSIS — Z8546 Personal history of malignant neoplasm of prostate: Secondary | ICD-10-CM | POA: Insufficient documentation

## 2012-01-21 DIAGNOSIS — I2510 Atherosclerotic heart disease of native coronary artery without angina pectoris: Secondary | ICD-10-CM | POA: Insufficient documentation

## 2012-01-21 DIAGNOSIS — S0083XA Contusion of other part of head, initial encounter: Secondary | ICD-10-CM | POA: Insufficient documentation

## 2012-01-21 DIAGNOSIS — IMO0002 Reserved for concepts with insufficient information to code with codable children: Secondary | ICD-10-CM | POA: Diagnosis not present

## 2012-01-21 DIAGNOSIS — R404 Transient alteration of awareness: Secondary | ICD-10-CM | POA: Insufficient documentation

## 2012-01-21 MED ORDER — HYDROCODONE-ACETAMINOPHEN 5-500 MG PO TABS: 1.0000 | ORAL_TABLET | Freq: Four times a day (QID) | ORAL | Status: AC | PRN

## 2012-01-21 NOTE — Discharge Instructions (Signed)
Blunt Chest Trauma Blunt chest trauma is an injury caused by a blow to the chest. These chest injuries can be very painful. Blunt chest trauma often results in bruised or broken (fractured) ribs. Most cases of bruised and fractured ribs from blunt chest traumas get better after 1 to 3 weeks of rest and pain medicine. Often, the soft tissue in the chest wall is also injured, causing pain and bruising. Internal organs, such as the heart and lungs, may also be injured. Blunt chest trauma can lead to serious medical problems. This injury requires immediate medical care. CAUSES   Motor vehicle collisions.   Falls.   Physical violence.   Sports injuries.  SYMPTOMS   Chest pain. The pain may be worse when you move or breathe deeply.   Shortness of breath.   Lightheadedness.   Bruising.   Tenderness.   Swelling.  DIAGNOSIS  Your caregiver will do a physical exam. X-rays may be taken to look for fractures. However, minor rib fractures may not show up on X-rays until a few days after the injury. If a more serious injury is suspected, further imaging tests may be done. This may include ultrasounds, computed tomography (CT) scans, or magnetic resonance imaging (MRI). TREATMENT  Treatment depends on the severity of your injury. Your caregiver may prescribe pain medicines and deep breathing exercises. HOME CARE INSTRUCTIONS  Limit your activities until you can move around without much pain.   Do not do any strenuous work until your injury is healed.   Put ice on the injured area.   Put ice in a plastic bag.   Place a towel between your skin and the bag.   Leave the ice on for 15 to 20 minutes, 3 to 4 times a day.   You may wear a rib belt as directed by your caregiver to reduce pain.   Practice deep breathing as directed by your caregiver to keep your lungs clear.   Only take over-the-counter or prescription medicines for pain, fever, or discomfort as directed by your caregiver.    SEEK IMMEDIATE MEDICAL CARE IF:   You have increasing pain or shortness of breath.   You cough up blood.   You have nausea, vomiting, or abdominal pain.   You have a fever.   You feel dizzy, weak, or you faint.  MAKE SURE YOU:  Understand these instructions.   Will watch your condition.   Will get help right away if you are not doing well or get worse.  Document Released: 10/13/2004 Document Revised: 08/25/2011 Document Reviewed: 06/22/2011 [employer] Patient Information 2012 [employer]. 

## 2012-01-21 NOTE — ED Notes (Signed)
Pt c/o RT rib pain s/p fall (was knocked down by a "scrape blade" (tractor equipment)); thinks he may have "blacked out" 4-5 sec when his head hit the mud

## 2012-01-21 NOTE — ED Provider Notes (Signed)
History     CSN: [id number]  Arrival date & time 01/21/12  0927   First MD Initiated Contact with Patient 01/21/12 [phone]      Chief Complaint  Patient presents with  . Fall  . Rib Injury    (Consider location/radiation/quality/duration/timing/severity/associated sxs/prior treatment) Patient is a 76 y.o. male presenting with fall. The history is provided by the patient.  Fall The accident occurred yesterday. 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  He hit his head and blacked out for a few seconds.  Patient is on coumadin. He landed on dirt. There was no blood loss. The point of impact was the head. The pain is present in the head (right ribs). The pain is moderate. He was ambulatory at the scene. There was no entrapment after the fall.    Past Medical History  Diagnosis Date  . Mediastinal lymphadenopathy   . History of prostate cancer   . Atrial fibrillation   . COPD (chronic obstructive pulmonary disease)   . CAD (coronary artery disease)   . Adrenal adenoma   . Warthin's tumor   . Dyslipidemia   . Diverticulosis   . Embolic stroke     d/t endocarditis 2008  . Atrial flutter   . Ulcerative colitis     left-sided, associated with diverticulosis  . Obstructive chronic bronchitis   . HTN (hypertension)   . Thrombocytopenia   . Emphysema   . Right thyroid nodule   . Hemorrhoids   . TIA (transient ischemic attack) 03/25/2011    ? of due to transient diplopia    Past Surgical History  Procedure Date  . Mitral valve repair 09/22/04    and modified Cox-Maze IV   . Mitral valve repair 09/14/05    redo MV repair d/t endocardiits/embolic events  . Prostatectomy   . Inguinal hernia repair     right  . Cataract extraction   . Salivary gland surgery     left resection  . Flexible sigmoidoscopy 01/11/2008; 03/19/2010    2009 and 2011:segmental left colitis, diverticulosis, hemorrhoids  . Colonoscopy w/  biopsies 12/19/2007    left-sided colitis, diverticulosis, hemorrhoids    Family History  Problem Relation Age of Onset  . Stroke Father   . Heart disease Father   . Cancer Mother     spinal  . Colon cancer Neg Hx     History  Substance Use Topics  . Smoking status: Former Smoker -- 3.0 packs/day for 60 years    Types: Cigarettes    Quit date: 09/19/2002  . Smokeless tobacco: Never Used  . Alcohol Use: No      Review of Systems  All other systems reviewed and are negative.    Allergies  Review of patient's allergies indicates no known allergies.  Home Medications   Current Outpatient Rx  Name Route Sig Dispense Refill  . ALBUTEROL SULFATE (2.5 MG/3ML) 0.083% IN NEBU Nebulization Take 2.5 mg by nebulization every 6 (six) hours as needed.     . ALBUTEROL SULFATE HFA 108 (90 BASE) MCG/ACT IN AERS Inhalation Inhale 2 puffs into the lungs every 6 (six) hours as needed for wheezing. 1 Inhaler 1  . CLONAZEPAM 0.5 MG PO TABS Oral Take 0.5 mg by mouth 2 (two) times daily as needed.      Marland Kitchen DILTIAZEM HCL ER COATED BEADS 240 MG PO CP24 Oral Take 1 capsule (240 mg total) by mouth  daily. 90 capsule 3  . ENOXAPARIN SODIUM 100 MG/ML  SOLN Subcutaneous Inject 1 mL (100 mg total) into the skin every 12 (twelve) hours. 20 Syringe 1  . FERROUS SULFATE 325 (65 FE) MG PO TABS Oral Take 325 mg by mouth daily with breakfast.      . MESALAMINE 400 MG PO TBEC Oral Take 400 mg by mouth 2 (two) times daily.      Marland Kitchen SIMVASTATIN 20 MG PO TABS Oral Take 1 tablet (20 mg total) by mouth at bedtime. 90 tablet 3  . WARFARIN SODIUM 5 MG PO TABS  TAKE AS DIRECTED BY ANTICOAGULATION CLINIC 120 tablet 1    This is a 90 day supply    BP 156/94  Pulse 45  Temp(Src) 97.5 F (36.4 C) (Oral)  Resp 24  SpO2 96%  Physical Exam  Nursing note and vitals reviewed. Constitutional: He is oriented to person, place, and time. He appears well-developed and well-nourished. No distress.  HENT:  Head:  Normocephalic.       There is bruising, ecchymosis to the right temple.  Eyes: EOM are normal. Pupils are equal, round, and reactive to light.  Neck: Normal range of motion. Neck supple.  Cardiovascular: Normal rate and regular rhythm.   Pulmonary/Chest: Effort normal and breath sounds normal. No respiratory distress.  Abdominal: Soft. Bowel sounds are normal.  Musculoskeletal: Normal range of motion. He exhibits no edema.  Neurological: He is alert and oriented to person, place, and time. No cranial nerve deficit. He exhibits normal muscle tone. Coordination normal.  Skin: Skin is warm and dry. He is not diaphoretic.    ED Course  Procedures (including critical care time)  Labs Reviewed - No data to display No results found.   No diagnosis found.    MDM  No fractures of ribs and no intracranial bleeding.  Will discharge to home with pain meds, time.  Return or follow up prn.        Geoffery Lyons, MD 01/21/12 1038

## 2012-01-24 DIAGNOSIS — S81009A Unspecified open wound, unspecified knee, initial encounter: Secondary | ICD-10-CM | POA: Diagnosis not present

## 2012-01-24 DIAGNOSIS — S91009A Unspecified open wound, unspecified ankle, initial encounter: Secondary | ICD-10-CM | POA: Diagnosis not present

## 2012-01-25 ENCOUNTER — Encounter: Payer: Self-pay | Admitting: Internal Medicine

## 2012-01-25 ENCOUNTER — Ambulatory Visit (INDEPENDENT_AMBULATORY_CARE_PROVIDER_SITE_OTHER): Payer: Medicare Other | Admitting: Internal Medicine

## 2012-01-25 VITALS — BP 130/76 | HR 45 | Temp 97.6°F | Wt 201.0 lb

## 2012-01-25 DIAGNOSIS — M79609 Pain in unspecified limb: Secondary | ICD-10-CM | POA: Diagnosis not present

## 2012-01-25 DIAGNOSIS — L0291 Cutaneous abscess, unspecified: Secondary | ICD-10-CM | POA: Diagnosis not present

## 2012-01-25 DIAGNOSIS — L0390 Cellulitis, unspecified: Secondary | ICD-10-CM

## 2012-01-25 DIAGNOSIS — Z7901 Long term (current) use of anticoagulants: Secondary | ICD-10-CM

## 2012-01-25 DIAGNOSIS — M79606 Pain in leg, unspecified: Secondary | ICD-10-CM

## 2012-01-25 LAB — POCT INR: INR: 3.5

## 2012-01-25 MED ORDER — AMOXICILLIN-POT CLAVULANATE 875-125 MG PO TABS: 1.0000 | ORAL_TABLET | Freq: Two times a day (BID) | ORAL | Status: AC

## 2012-01-25 MED ORDER — AMOXICILLIN-POT CLAVULANATE 875-125 MG PO TABS: 1.0000 | ORAL_TABLET | Freq: Two times a day (BID) | ORAL | Status: DC

## 2012-01-25 NOTE — Patient Instructions (Addendum)
Dip gauze in  sterile saline and applied to the wound twice a day. Cover the wound with Telfa , non stick dressing  without any antibiotic ointment. The saline can be purchased at the drugstore or you can make your own .Boil cup of salt in a gallon of water. Store mixture  in a clean container.Report Warning  signs as discussed (red streaks, pus, fever, increasing pain). STOP Neosporin.  Your PT/INR is 3.5. Take warfarin 5 mg today and every day except 7.5 mg (one & 1/2 half pills) on Tuesday, Thursday (but not today), and Saturday. Check the PT/INR in one week.

## 2012-01-25 NOTE — Progress Notes (Signed)
  Subjective:    Patient ID: [name], male    DOB: 02/10/1936, 76 y.o.   MRN: [id number]  HPI On 01/20/12 he lost his balance while working on his tractor and fell. There was no cardiac or neurologic prodrome prior to the fall. His  initial concern was a rib injury for which he was seen in the emergency room. Apparently there was no rib fracture. By 5/5 he noticed redness of the left shin with swelling of the left leg and discomfort with weightbearing. He is taking an antibiotic , generic Keflex prescribed by the  [facility].  He put "artificial skin" over the wound but this caused discomfort. He is been applying topical Neopsporin  antibiotic    Review of Systems He denies fever chills or sweats. There's been minimal  purulence from the shin  wound.  He is not having chest pain, dyspnea, or hemoptysis.  He is on warfarin; he was due for recheck of his PT/INR on 5/13.     Objective:   Physical Exam he is in no acute distress; he appears well-nourished. He actually appears younger than stated age  There is no increased work of breathing the breath sounds are somewhat distant.  He has an S4 without significant murmurs or gallops.  Pedal pulses are intact.  He has trace-1/2+ edema of the left ankle.  The left calf appears swollen; Homans sign is negative.  There are extensive excoriations, 9.5 X 1 cm, over the left anterior shin with marked erythema around these areas. The erythematous tissue is tender to palpation         Assessment  #1 status post injury to the left shin with subsequent excoriations which are scabbing. There is erythema around these areas which could indicate cellulitis. Of concern is possible contact dermatitis related to the Neosporin.   &  trauma to the left shin with subsequent excoriations. There is erythema traumaPlan:    #1 trauma to the left shin with excoriations; cellulitis versus contact dermatitis around the excoriated areas.  Plan:  Antibiotic will be changed to broader spectrum. He'll be asked to use saline compresses to the left shin and stop the Neosporin

## 2012-01-30 ENCOUNTER — Ambulatory Visit (INDEPENDENT_AMBULATORY_CARE_PROVIDER_SITE_OTHER): Payer: Medicare Other | Admitting: Pharmacist

## 2012-01-30 DIAGNOSIS — Z8679 Personal history of other diseases of the circulatory system: Secondary | ICD-10-CM | POA: Diagnosis not present

## 2012-01-30 DIAGNOSIS — I4892 Unspecified atrial flutter: Secondary | ICD-10-CM

## 2012-01-30 DIAGNOSIS — Z7901 Long term (current) use of anticoagulants: Secondary | ICD-10-CM | POA: Diagnosis not present

## 2012-01-30 LAB — POCT INR: INR: 2.3

## 2012-02-07 DIAGNOSIS — D485 Neoplasm of uncertain behavior of skin: Secondary | ICD-10-CM | POA: Diagnosis not present

## 2012-03-07 ENCOUNTER — Ambulatory Visit: Payer: Medicare Other | Admitting: Pulmonary Disease

## 2012-03-12 ENCOUNTER — Ambulatory Visit (INDEPENDENT_AMBULATORY_CARE_PROVIDER_SITE_OTHER): Payer: Medicare Other

## 2012-03-12 DIAGNOSIS — Z7901 Long term (current) use of anticoagulants: Secondary | ICD-10-CM | POA: Diagnosis not present

## 2012-03-12 DIAGNOSIS — I4892 Unspecified atrial flutter: Secondary | ICD-10-CM | POA: Diagnosis not present

## 2012-03-12 DIAGNOSIS — Z8679 Personal history of other diseases of the circulatory system: Secondary | ICD-10-CM | POA: Diagnosis not present

## 2012-03-20 ENCOUNTER — Encounter: Payer: Self-pay | Admitting: Pulmonary Disease

## 2012-03-20 ENCOUNTER — Ambulatory Visit (INDEPENDENT_AMBULATORY_CARE_PROVIDER_SITE_OTHER): Payer: Medicare Other | Admitting: Pulmonary Disease

## 2012-03-20 VITALS — BP 126/70 | HR 67 | Temp 97.5°F | Ht 70.0 in | Wt 201.0 lb

## 2012-03-20 DIAGNOSIS — J438 Other emphysema: Secondary | ICD-10-CM

## 2012-03-20 NOTE — Patient Instructions (Addendum)
Will try you on dulera 100/5  2 inhalations am and pm everyday for one month to see if it helps.  Rinse mouth well.  If it does help, please call for a prescription.  If does not help, discontinue Will send in prescription for your albuterol to use as needed. Work on weight loss If doing well, followup with me in one year.

## 2012-03-20 NOTE — Assessment & Plan Note (Signed)
The patient is stable from a pulmonary standpoint, but is not satisfied with his current exertional tolerance.  I have asked him to try a maintenance bronchodilator regimen, and he is now willing to do this.  He has not responded in the past to Spiriva, symbicort, or Advair.  I would like to try him on dulera to see if he notices a difference.  I have also stressed to him the importance of weight loss and conditioning.

## 2012-03-20 NOTE — Progress Notes (Signed)
  Subjective:    Patient ID: [name], male    DOB: 1935/09/29, 76 y.o.   MRN: [id number]  HPI Patient comes in today for followup of his known COPD.  He has wanted to avoid maintenance bronchodilators, and is using albuterol as needed.  He feels that his breathing is at baseline, but he is not satisfied with his exertional tolerance currently.  He has had to limit his activities because of his breathing.  He denies any significant cough or mucus production, and has not had an acute exacerbation since his last visit.   Review of Systems  Constitutional: Negative for fever and unexpected weight change.  HENT: Negative for ear pain, nosebleeds, congestion, sore throat, rhinorrhea, sneezing, trouble swallowing, dental problem, postnasal drip and sinus pressure.   Eyes: Negative for redness and itching.  Respiratory: Positive for cough and shortness of breath. Negative for chest tightness and wheezing.   Cardiovascular: Negative for palpitations and leg swelling.  Gastrointestinal: Negative for nausea and vomiting.  Genitourinary: Negative for dysuria.  Musculoskeletal: Negative for joint swelling.  Skin: Negative for rash.  Neurological: Negative for headaches.  Hematological: Does not bruise/bleed easily.  Psychiatric/Behavioral: Negative for dysphoric mood. The patient is not nervous/anxious.   All other systems reviewed and are negative.       Objective:   Physical Exam Overweight male in no acute distress Nose without purulence or discharge noted Chest with mildly decreased breath sounds, but no wheezing.  Mild basilar crackles noted Cardiac exam is regular rate and rhythm Lower extremities with minimal edema, cyanosis Alert and oriented, moves all 4 extremities.       Assessment & Plan:

## 2012-03-26 ENCOUNTER — Ambulatory Visit (INDEPENDENT_AMBULATORY_CARE_PROVIDER_SITE_OTHER): Payer: Medicare Other | Admitting: *Deleted

## 2012-03-26 DIAGNOSIS — Z8679 Personal history of other diseases of the circulatory system: Secondary | ICD-10-CM | POA: Diagnosis not present

## 2012-03-26 DIAGNOSIS — I4892 Unspecified atrial flutter: Secondary | ICD-10-CM | POA: Diagnosis not present

## 2012-03-26 DIAGNOSIS — Z7901 Long term (current) use of anticoagulants: Secondary | ICD-10-CM

## 2012-03-26 LAB — POCT INR: INR: 2.6

## 2012-04-23 ENCOUNTER — Ambulatory Visit (INDEPENDENT_AMBULATORY_CARE_PROVIDER_SITE_OTHER): Payer: Medicare Other | Admitting: *Deleted

## 2012-04-23 DIAGNOSIS — Z7901 Long term (current) use of anticoagulants: Secondary | ICD-10-CM | POA: Diagnosis not present

## 2012-04-23 DIAGNOSIS — I4892 Unspecified atrial flutter: Secondary | ICD-10-CM | POA: Diagnosis not present

## 2012-04-23 DIAGNOSIS — Z8679 Personal history of other diseases of the circulatory system: Secondary | ICD-10-CM | POA: Diagnosis not present

## 2012-05-28 ENCOUNTER — Ambulatory Visit (INDEPENDENT_AMBULATORY_CARE_PROVIDER_SITE_OTHER): Payer: Medicare Other | Admitting: *Deleted

## 2012-05-28 DIAGNOSIS — Z7901 Long term (current) use of anticoagulants: Secondary | ICD-10-CM

## 2012-05-28 DIAGNOSIS — I4892 Unspecified atrial flutter: Secondary | ICD-10-CM | POA: Diagnosis not present

## 2012-05-28 DIAGNOSIS — Z8679 Personal history of other diseases of the circulatory system: Secondary | ICD-10-CM | POA: Diagnosis not present

## 2012-06-29 ENCOUNTER — Other Ambulatory Visit: Payer: Self-pay | Admitting: Internal Medicine

## 2012-07-09 ENCOUNTER — Ambulatory Visit (INDEPENDENT_AMBULATORY_CARE_PROVIDER_SITE_OTHER): Payer: Medicare Other

## 2012-07-09 DIAGNOSIS — Z7901 Long term (current) use of anticoagulants: Secondary | ICD-10-CM | POA: Diagnosis not present

## 2012-07-09 DIAGNOSIS — Z23 Encounter for immunization: Secondary | ICD-10-CM | POA: Diagnosis not present

## 2012-07-09 DIAGNOSIS — Z8679 Personal history of other diseases of the circulatory system: Secondary | ICD-10-CM | POA: Diagnosis not present

## 2012-07-09 DIAGNOSIS — I4892 Unspecified atrial flutter: Secondary | ICD-10-CM | POA: Diagnosis not present

## 2012-07-26 ENCOUNTER — Ambulatory Visit: Payer: Medicare Other | Admitting: Cardiology

## 2012-07-27 ENCOUNTER — Encounter: Payer: Self-pay | Admitting: Cardiology

## 2012-07-27 ENCOUNTER — Ambulatory Visit (INDEPENDENT_AMBULATORY_CARE_PROVIDER_SITE_OTHER): Payer: Medicare Other | Admitting: Cardiology

## 2012-07-27 VITALS — BP 137/80 | HR 74 | Wt 204.0 lb

## 2012-07-27 DIAGNOSIS — E7800 Pure hypercholesterolemia, unspecified: Secondary | ICD-10-CM

## 2012-07-27 DIAGNOSIS — I4891 Unspecified atrial fibrillation: Secondary | ICD-10-CM | POA: Diagnosis not present

## 2012-07-27 DIAGNOSIS — Z9889 Other specified postprocedural states: Secondary | ICD-10-CM | POA: Diagnosis not present

## 2012-07-27 DIAGNOSIS — I10 Essential (primary) hypertension: Secondary | ICD-10-CM

## 2012-07-27 DIAGNOSIS — I2510 Atherosclerotic heart disease of native coronary artery without angina pectoris: Secondary | ICD-10-CM | POA: Diagnosis not present

## 2012-07-27 DIAGNOSIS — Z7901 Long term (current) use of anticoagulants: Secondary | ICD-10-CM

## 2012-07-27 LAB — CBC WITH DIFFERENTIAL/PLATELET
Basophils Relative: 0.8 % (ref 0.0–3.0)
Eosinophils Absolute: 0.2 10*3/uL (ref 0.0–0.7)
Eosinophils Relative: 3.6 % (ref 0.0–5.0)
HCT: 42.9 % (ref 39.0–52.0)
Lymphs Abs: 2.3 10*3/uL (ref 0.7–4.0)
MCHC: 33.2 g/dL (ref 30.0–36.0)
MCV: 93.9 fl (ref 78.0–100.0)
Monocytes Absolute: 0.8 10*3/uL (ref 0.1–1.0)
Platelets: 143 10*3/uL — ABNORMAL LOW (ref 150.0–400.0)
RBC: 4.57 Mil/uL (ref 4.22–5.81)
WBC: 6.3 10*3/uL (ref 4.5–10.5)

## 2012-07-27 LAB — BASIC METABOLIC PANEL
BUN: 14 mg/dL (ref 6–23)
CO2: 27 mEq/L (ref 19–32)
Chloride: 105 mEq/L (ref 96–112)
GFR: 57.26 mL/min — ABNORMAL LOW (ref >60.00)
Glucose, Bld: 79 mg/dL (ref 70–99)
Potassium: 4.3 mEq/L (ref 3.5–5.1)
Sodium: 138 mEq/L (ref 135–145)

## 2012-07-27 LAB — HEPATIC FUNCTION PANEL
ALT: 14 U/L (ref 0–53)
Bilirubin, Direct: 0.2 mg/dL (ref 0.0–0.3)
Total Bilirubin: 0.6 mg/dL (ref 0.3–1.2)

## 2012-07-27 LAB — LIPID PANEL
LDL Cholesterol: 54 mg/dL (ref 0–99)
Total CHOL/HDL Ratio: 3

## 2012-07-27 MED ORDER — WARFARIN SODIUM 5 MG PO TABS: 5.0000 mg | ORAL_TABLET | Freq: Every day | ORAL | Status: DC

## 2012-07-27 MED ORDER — SIMVASTATIN 20 MG PO TABS: 20.0000 mg | ORAL_TABLET | Freq: Every day | ORAL | Status: DC

## 2012-07-27 MED ORDER — DILTIAZEM HCL ER COATED BEADS 240 MG PO CP24: 240.0000 mg | ORAL_CAPSULE | Freq: Every day | ORAL | Status: DC

## 2012-07-27 NOTE — Assessment & Plan Note (Signed)
Continue Cardizem and Coumadin. 

## 2012-07-27 NOTE — Patient Instructions (Addendum)
Your physician wants you to follow-up in: ONE YEAR WITH DR CRENSHAW You will receive a reminder letter in the mail two months in advance. If you don't receive a letter, please call our office to schedule the follow-up appointment.   Your physician has requested that you have an echocardiogram. Echocardiography is a painless test that uses sound waves to create images of your heart. It provides your doctor with information about the size and shape of your heart and how well your heart's chambers and valves are working. This procedure takes approximately one hour. There are no restrictions for this procedure.   Your physician recommends that you HAVE LAB WORK TODAY 

## 2012-07-27 NOTE — Assessment & Plan Note (Signed)
Goal INR 2-3. Check hemoglobin.

## 2012-07-27 NOTE — Progress Notes (Signed)
HPI: [name] is a gentleman who has a history of mitral valve repair as well as atrial fibrillation/flutter. 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 Note cardiac catheterization in December of 2006 showed a 40-50% stenosis but was otherwise without coronary disease. Patient was admitted in July 2012 with diplopia. Echocardiogram showed normal LV function, mild mitral stenosis and mild biatrial enlargement. Carotid Dopplers negative. I last saw him in November of 2012. Since then, he does have dyspnea on exertion but denies orthopnea, PND, pedal edema, palpitations, syncope or chest pain.   Current Outpatient Prescriptions  Medication Sig Dispense Refill  . albuterol (PROVENTIL) (2.5 MG/3ML) 0.083% nebulizer solution Take 2.5 mg by nebulization every 6 (six) hours as needed.       . diltiazem (CARDIZEM CD) 240 MG 24 hr capsule Take 1 capsule (240 mg total) by mouth daily.  90 capsule  3  . simvastatin (ZOCOR) 20 MG tablet Take 1 tablet (20 mg total) by mouth at bedtime.  90 tablet  3  . sulfaSALAzine (AZULFIDINE) 500 MG tablet TAKE ONE TABLET BY MOUTH TWICE DAILY  180 tablet  0  . warfarin (COUMADIN) 5 MG tablet TAKE AS DIRECTED BY ANTICOAGULATION CLINIC  120 tablet  1  . [DISCONTINUED] albuterol (VENTOLIN HFA) 108 (90 BASE) MCG/ACT inhaler Inhale 2 puffs into the lungs every 6 (six) hours as needed for wheezing.  1 Inhaler  1     Past Medical History  Diagnosis Date  . Mediastinal lymphadenopathy   . History of prostate cancer   . Atrial fibrillation   . COPD (chronic obstructive pulmonary disease)   . CAD (coronary artery disease)   . Adrenal adenoma   . Warthin's tumor   . Dyslipidemia   . Diverticulosis   . Embolic stroke     d/t endocarditis 2008  . Atrial flutter   . Ulcerative colitis     left-sided, associated with diverticulosis  . Obstructive chronic bronchitis   . HTN (hypertension)     . Thrombocytopenia   . Emphysema   . Right thyroid nodule   . Hemorrhoids   . TIA (transient ischemic attack) 03/25/2011    ? of due to transient diplopia    Past Surgical History  Procedure Date  . Mitral valve repair 09/22/04    and modified Cox-Maze IV   . Mitral valve repair 09/14/05    redo MV repair d/t endocardiits/embolic events  . Prostatectomy   . Inguinal hernia repair     right  . Cataract extraction   . Salivary gland surgery     left resection  . Flexible sigmoidoscopy 01/11/2008; 03/19/2010    2009 and 2011:segmental left colitis, diverticulosis, hemorrhoids  . Colonoscopy w/ biopsies 12/19/2007    left-sided colitis, diverticulosis, hemorrhoids    History   Social History  . Marital Status: Married    Spouse Name: N/A    Number of Children: N/A  . Years of Education: N/A   Occupational History  . RETIRED     [employer]    Social History Main Topics  . Smoking status: Former Smoker -- 3.0 packs/day for 60 years    Types: Cigarettes    Quit date: 09/19/2002  . Smokeless tobacco: Never Used  . Alcohol Use: No  . Drug Use: No  . Sexually Active: Not on file   Other Topics Concern  . Not on file   Social History Narrative  Pt has children    ROS: no fevers or chills, productive cough, hemoptysis, dysphasia, odynophagia, melena, hematochezia, dysuria, hematuria, rash, seizure activity, orthopnea, PND, pedal edema, claudication. Remaining systems are negative.  Physical Exam: Well-developed well-nourished in no acute distress.  Skin is warm and dry.  HEENT is normal.  Neck is supple.  Chest is clear to auscultation with normal expansion.  Cardiovascular exam is irregular Abdominal exam nontender or distended. No masses palpated. Extremities show no edema. neuro grossly intact  ECG atrial fibrillation at a rate of 74. Right axis deviation. Low voltage. No ST changes.

## 2012-07-27 NOTE — Assessment & Plan Note (Signed)
Continue SBE prophylaxis. Repeat echocardiogram. 

## 2012-07-27 NOTE — Assessment & Plan Note (Addendum)
Blood pressure controlled. Continue present medications. Check potassium and renal function. 

## 2012-07-27 NOTE — Assessment & Plan Note (Signed)
Continue statin. Not on aspirin given need for Coumadin. 

## 2012-07-27 NOTE — Assessment & Plan Note (Signed)
Continue statin. Check lipids and liver. 

## 2012-07-30 ENCOUNTER — Encounter: Payer: Self-pay | Admitting: *Deleted

## 2012-08-01 ENCOUNTER — Ambulatory Visit (HOSPITAL_COMMUNITY): Payer: Medicare Other | Attending: Cardiovascular Disease | Admitting: Radiology

## 2012-08-01 DIAGNOSIS — J449 Chronic obstructive pulmonary disease, unspecified: Secondary | ICD-10-CM | POA: Diagnosis not present

## 2012-08-01 DIAGNOSIS — I4891 Unspecified atrial fibrillation: Secondary | ICD-10-CM | POA: Diagnosis not present

## 2012-08-01 DIAGNOSIS — Z87891 Personal history of nicotine dependence: Secondary | ICD-10-CM | POA: Insufficient documentation

## 2012-08-01 DIAGNOSIS — Z8673 Personal history of transient ischemic attack (TIA), and cerebral infarction without residual deficits: Secondary | ICD-10-CM | POA: Insufficient documentation

## 2012-08-01 DIAGNOSIS — I10 Essential (primary) hypertension: Secondary | ICD-10-CM | POA: Diagnosis not present

## 2012-08-01 DIAGNOSIS — I359 Nonrheumatic aortic valve disorder, unspecified: Secondary | ICD-10-CM | POA: Diagnosis not present

## 2012-08-01 DIAGNOSIS — E785 Hyperlipidemia, unspecified: Secondary | ICD-10-CM | POA: Insufficient documentation

## 2012-08-01 DIAGNOSIS — I2510 Atherosclerotic heart disease of native coronary artery without angina pectoris: Secondary | ICD-10-CM | POA: Insufficient documentation

## 2012-08-01 DIAGNOSIS — J4489 Other specified chronic obstructive pulmonary disease: Secondary | ICD-10-CM | POA: Insufficient documentation

## 2012-08-01 DIAGNOSIS — I059 Rheumatic mitral valve disease, unspecified: Secondary | ICD-10-CM | POA: Diagnosis not present

## 2012-08-01 DIAGNOSIS — Z9889 Other specified postprocedural states: Secondary | ICD-10-CM

## 2012-08-01 DIAGNOSIS — I517 Cardiomegaly: Secondary | ICD-10-CM | POA: Insufficient documentation

## 2012-08-01 NOTE — Progress Notes (Signed)
Echocardiogram performed.  

## 2012-08-20 ENCOUNTER — Telehealth: Payer: Self-pay | Admitting: *Deleted

## 2012-08-20 ENCOUNTER — Ambulatory Visit (INDEPENDENT_AMBULATORY_CARE_PROVIDER_SITE_OTHER): Payer: Medicare Other | Admitting: *Deleted

## 2012-08-20 DIAGNOSIS — Z7901 Long term (current) use of anticoagulants: Secondary | ICD-10-CM | POA: Diagnosis not present

## 2012-08-20 DIAGNOSIS — I4892 Unspecified atrial flutter: Secondary | ICD-10-CM | POA: Diagnosis not present

## 2012-08-20 DIAGNOSIS — Z8679 Personal history of other diseases of the circulatory system: Secondary | ICD-10-CM

## 2012-08-20 MED ORDER — PRAVASTATIN SODIUM 40 MG PO TABS: 40.0000 mg | ORAL_TABLET | Freq: Every evening | ORAL | Status: DC

## 2012-08-20 NOTE — Telephone Encounter (Signed)
Spoke with pt wife, we received a fax from [facility] about the pt being on zocor and diltiazem. Per dr Jens Som the pt will change to pravastatin 40 mg. Script sent to pt to have labs checked with coumadin in 6 weeks at PCP.

## 2012-08-29 DIAGNOSIS — C61 Malignant neoplasm of prostate: Secondary | ICD-10-CM | POA: Diagnosis not present

## 2012-08-29 DIAGNOSIS — N529 Male erectile dysfunction, unspecified: Secondary | ICD-10-CM | POA: Diagnosis not present

## 2012-08-29 DIAGNOSIS — R3911 Hesitancy of micturition: Secondary | ICD-10-CM | POA: Diagnosis not present

## 2012-08-30 ENCOUNTER — Encounter: Payer: Self-pay | Admitting: Adult Health

## 2012-08-30 ENCOUNTER — Ambulatory Visit (INDEPENDENT_AMBULATORY_CARE_PROVIDER_SITE_OTHER): Payer: Medicare Other | Admitting: Adult Health

## 2012-08-30 ENCOUNTER — Other Ambulatory Visit (INDEPENDENT_AMBULATORY_CARE_PROVIDER_SITE_OTHER): Payer: Medicare Other

## 2012-08-30 ENCOUNTER — Ambulatory Visit (INDEPENDENT_AMBULATORY_CARE_PROVIDER_SITE_OTHER)
Admission: RE | Admit: 2012-08-30 | Discharge: 2012-08-30 | Disposition: A | Discharge status: Home / Self Care | Payer: Medicare Other | Source: Ambulatory Visit | Attending: Adult Health | Admitting: Adult Health

## 2012-08-30 VITALS — BP 130/72 | HR 91 | Temp 97.2°F | Ht 70.0 in | Wt 197.2 lb

## 2012-08-30 DIAGNOSIS — R0989 Other specified symptoms and signs involving the circulatory and respiratory systems: Secondary | ICD-10-CM

## 2012-08-30 DIAGNOSIS — R0609 Other forms of dyspnea: Secondary | ICD-10-CM | POA: Diagnosis not present

## 2012-08-30 DIAGNOSIS — R0600 Dyspnea, unspecified: Secondary | ICD-10-CM

## 2012-08-30 DIAGNOSIS — J449 Chronic obstructive pulmonary disease, unspecified: Secondary | ICD-10-CM

## 2012-08-30 DIAGNOSIS — J438 Other emphysema: Secondary | ICD-10-CM | POA: Diagnosis not present

## 2012-08-30 DIAGNOSIS — I509 Heart failure, unspecified: Secondary | ICD-10-CM | POA: Diagnosis not present

## 2012-08-30 DIAGNOSIS — J4489 Other specified chronic obstructive pulmonary disease: Secondary | ICD-10-CM

## 2012-08-30 LAB — BASIC METABOLIC PANEL
BUN: 19 mg/dL (ref 6–23)
Creatinine, Ser: 1.5 mg/dL (ref 0.4–1.5)
GFR: 49.29 mL/min — ABNORMAL LOW (ref >60.00)
Glucose, Bld: 93 mg/dL (ref 70–99)
Potassium: 4.1 mEq/L (ref 3.5–5.1)

## 2012-08-30 MED ORDER — TIOTROPIUM BROMIDE MONOHYDRATE 18 MCG IN CAPS: 18.0000 ug | ORAL_CAPSULE | Freq: Every day | RESPIRATORY_TRACT | Status: DC

## 2012-08-30 MED ORDER — ALBUTEROL SULFATE HFA 108 (90 BASE) MCG/ACT IN AERS: 2.0000 | INHALATION_SPRAY | RESPIRATORY_TRACT | Status: DC | PRN

## 2012-08-30 NOTE — Progress Notes (Signed)
  Subjective:    Patient ID: [name], male    DOB: 1936/06/17, 76 y.o.   MRN: [id number]  HPI 76 yo male , former smoker with known hx of COPD/Emphysema  Hx of MV replacement and atrial fib on chronic coumadin  PFT's 2009:  FEV1 1.85 (63%), ratio 59, DLCO 60%   12020-03-3012 Acute OV  Complains of progressive DOE and activity intolerance over last several months Breath wears out with minimal activity .  Does have minimal dry cough and wheezing from time to time.  No fever or purulent sputum. No orthopnea  Minimal desats with walking 88% w/ quick rebound to 92%.  Recent echo last month  EF 55%, - Left /Right  atrium was moderately dilated. - Right ventricle: The cavity size was mildly to moderately dilated. Systolic function was moderately reduced.-  PA peak pressure: 46mm Hg (S). Does have daytime sleepiness and night snoring.  No chest pain , fever or edema.  Previous tried on Spiriva, advair and symbicort in past without much help.  Not taking any inhalers at this time. Out of ventolin.          Review of Systems Constitutional:   No  weight loss, night sweats,  Fevers, chills,  +fatigue, or  lassitude.  HEENT:   No headaches,  Difficulty swallowing,  Tooth/dental problems, or  Sore throat,                No sneezing, itching, ear ache, nasal congestion, post nasal drip,   CV:  No chest pain,  Orthopnea, PND, swelling in lower extremities, anasarca, dizziness, palpitations, syncope.   GI  No heartburn, indigestion, abdominal pain, nausea, vomiting, diarrhea, change in bowel habits, loss of appetite, bloody stools.   Resp:    No coughing up of blood.   No chest wall deformity  Skin: no rash or lesions.  GU: no dysuria, change in color of urine, no urgency or frequency.  No flank pain, no hematuria   MS:  No joint pain or swelling.  No decreased range of motion.  No back pain.  Psych:  No change in mood or affect. No depression or anxiety.  No memory  loss.          Objective:   Physical Exam GEN: A/Ox3; pleasant , NAD, obese   HEENT:  [place]/AT,  EACs-clear, TMs-wnl, NOSE-clear, THROAT-clear, no lesions, no postnasal drip or exudate noted.   NECK:  Supple w/ fair ROM; no JVD; normal carotid impulses w/o bruits; no thyromegaly or nodules palpated; no lymphadenopathy.  RESP  Clear  P & A; w/o, wheezes/ rales/ or rhonchi.no accessory muscle use, no dullness to percussion  CARD: irreg  no m/r/g  , no peripheral edema, pulses intact, no cyanosis or clubbing.  GI:   Soft & nt; nml bowel sounds; no organomegaly or masses detected.  Musco: Warm bil, no deformities or joint swelling noted.   Neuro: alert, no focal deficits noted.    Skin: Warm, no lesions or rashes         Assessment & Plan:

## 2012-08-30 NOTE — Addendum Note (Signed)
Addended by: Boone Master E on: 1August 18, 202013 05:27 PM   Modules accepted: Orders

## 2012-08-30 NOTE — Assessment & Plan Note (Signed)
Minimal desats with walking , no O2 for now. Check ONO  Plan   Re-trial of spiriva   Check xray today along with BNP  Check ONO ? [place] OSA /nocturnal hypoxia.  Will need repeat spirometry in future

## 2012-08-30 NOTE — Patient Instructions (Addendum)
Begin Spiriva 1 puff daily  May use Ventolin 2 puffs every 4 hr as needed.  I will call with labs and xray results.  We are setting you up for an overnight oxygen check.  follow up Dr. Shelle Iron in 3-4 weeks and As needed   Please contact office for sooner follow up if symptoms do not improve or worsen or seek emergency care

## 2012-08-31 ENCOUNTER — Telehealth: Payer: Self-pay | Admitting: *Deleted

## 2012-08-31 NOTE — Progress Notes (Signed)
Ov reviewed and agree with plan as outlined.  

## 2012-08-31 NOTE — Telephone Encounter (Signed)
Error

## 2012-09-03 ENCOUNTER — Other Ambulatory Visit: Payer: Self-pay | Admitting: Adult Health

## 2012-09-03 DIAGNOSIS — R0609 Other forms of dyspnea: Secondary | ICD-10-CM | POA: Diagnosis not present

## 2012-09-03 DIAGNOSIS — J449 Chronic obstructive pulmonary disease, unspecified: Secondary | ICD-10-CM | POA: Diagnosis not present

## 2012-09-03 DIAGNOSIS — R0989 Other specified symptoms and signs involving the circulatory and respiratory systems: Secondary | ICD-10-CM | POA: Diagnosis not present

## 2012-09-03 MED ORDER — FUROSEMIDE 20 MG PO TABS: ORAL_TABLET | ORAL | Status: DC

## 2012-09-03 NOTE — Progress Notes (Signed)
Quick Note:  Patient's wife returned call. Advised of lab results / recs as stated by TP. Wife verbalized understanding and denied any questions. ______

## 2012-09-03 NOTE — Progress Notes (Signed)
Quick Note:  LMOM TCB x1. Orders only encounted created for lasix rx > sent to [facility]. OV w/ KC scheduled for 12.18.13 @ 1100. ______

## 2012-09-03 NOTE — Progress Notes (Signed)
Quick Note:  LMOM TCB x1. ______ 

## 2012-09-03 NOTE — Progress Notes (Signed)
Quick Note:  Patient's wife returned call. Advised of cxr results / recs as stated by TP. Pt's wife verbalized her understanding and denied any questions. Pt to take 1st dose lasix today, and keep 12.18.13 ov w/ KC. Pt/wife to call back for sooner follow up if symptoms worsen prior to ov. ______

## 2012-09-05 ENCOUNTER — Encounter: Payer: Self-pay | Admitting: Pulmonary Disease

## 2012-09-05 ENCOUNTER — Ambulatory Visit (INDEPENDENT_AMBULATORY_CARE_PROVIDER_SITE_OTHER): Payer: Medicare Other | Admitting: Pulmonary Disease

## 2012-09-05 VITALS — BP 112/70 | HR 54 | Temp 97.8°F | Ht 68.0 in | Wt 203.6 lb

## 2012-09-05 DIAGNOSIS — J438 Other emphysema: Secondary | ICD-10-CM

## 2012-09-05 MED ORDER — ALBUTEROL SULFATE HFA 108 (90 BASE) MCG/ACT IN AERS: 2.0000 | INHALATION_SPRAY | RESPIRATORY_TRACT | Status: DC | PRN

## 2012-09-05 NOTE — Progress Notes (Signed)
  Subjective:    Patient ID: [name], male    DOB: Nov 17, 1935, 76 y.o.   MRN: [id number]  HPI The patient comes in today for followup of his known moderate COPD.  He also has underlying heart disease and obesity/deconditioning which contribute to his dyspnea on exertion.  He was recently seen by our nurse practitioner for an acute visit, and retried on Spiriva again.  He has not responded to an aggressive bronchodilator regimen in the past.  He currently sees very little difference, but would like to give it a few more weeks.  The only medication that makes a difference for him is albuterol as needed.  He recently had some issues with worsening edema on chest x-ray, and took extra diuretic to get him through this.  Currently, he is not having any significant congestion, cough, or mucus production.  He did have a recent overnight oximetry which showed desaturation to 82%, and he spent over 6 hours less than 88%.   Review of Systems  Constitutional: Negative for fever and unexpected weight change.  HENT: Negative for ear pain, nosebleeds, congestion, sore throat, rhinorrhea, sneezing, trouble swallowing, dental problem, postnasal drip and sinus pressure.   Eyes: Negative for redness and itching.  Respiratory: Positive for cough and shortness of breath. Negative for chest tightness and wheezing.   Cardiovascular: Negative for palpitations and leg swelling.  Gastrointestinal: Negative for nausea and vomiting.  Genitourinary:       Trouble with urinating--slow stream  Musculoskeletal: Negative for joint swelling.  Skin: Negative for rash.  Neurological: Negative for headaches.  Hematological: Does not bruise/bleed easily.  Psychiatric/Behavioral: Negative for dysphoric mood. The patient is not nervous/anxious.        Objective:   Physical Exam Obese male in no acute distress Nose without purulence or discharge noted Oropharynx clear Neck is supple without lymphadenopathy or  thyromegaly Chest with mild decrease in breath sounds, faint basilar crackles, no wheezing Cardiac exam with a regular rhythm but controlled ventricular response Lower extremities with mild edema, no cyanosis Alert and oriented, moves all 4 extremities.       Assessment & Plan:

## 2012-09-05 NOTE — Patient Instructions (Addendum)
Will start on oxygen to wear at night only while sleeping. Stay on spiriva for another few weeks, and if you think it is helping you, can send in prescription. Stay on albuterol as needed.  Work on weight loss, and consider the pulmonary rehab program at the hospital and I will refer you. followup with me in 4mos.  Cancel any leftover apptms.

## 2012-09-05 NOTE — Assessment & Plan Note (Signed)
The patient has a history of moderate airflow obstruction by PFTs in the past, but he has not responded to many different trials of bronchodilators.  I suspect he primarily has emphysema that is out of proportion to his physiologic airflow obstruction.  There are some patients who do not respond to a bronchodilator regimen.  I have also reminded him that his cardiac disease as well as obesity and deconditioning contributes to his symptoms.  He is trying Spiriva again, and will let us know if this helps.  He does have desaturation while sleeping, and would therefore start him on nocturnal oxygen.

## 2012-09-25 ENCOUNTER — Ambulatory Visit: Payer: Medicare Other | Admitting: Pulmonary Disease

## 2012-09-25 ENCOUNTER — Telehealth: Payer: Self-pay | Admitting: Pulmonary Disease

## 2012-09-25 NOTE — Telephone Encounter (Signed)
Will forward to [facility] as FYI.   Dr Shelle Iron please advise. Form is in folder to sign. Thanks.

## 2012-09-27 ENCOUNTER — Encounter: Payer: Self-pay | Admitting: Adult Health

## 2012-09-27 NOTE — Telephone Encounter (Signed)
Pt advised and paperwork placed at front. Carron Curie, CMA

## 2012-09-27 NOTE — Telephone Encounter (Signed)
done

## 2012-10-01 ENCOUNTER — Ambulatory Visit (INDEPENDENT_AMBULATORY_CARE_PROVIDER_SITE_OTHER): Payer: Medicare Other

## 2012-10-01 DIAGNOSIS — Z7901 Long term (current) use of anticoagulants: Secondary | ICD-10-CM

## 2012-10-01 DIAGNOSIS — Z8679 Personal history of other diseases of the circulatory system: Secondary | ICD-10-CM | POA: Diagnosis not present

## 2012-10-01 DIAGNOSIS — I4892 Unspecified atrial flutter: Secondary | ICD-10-CM | POA: Diagnosis not present

## 2012-10-14 ENCOUNTER — Other Ambulatory Visit: Payer: Self-pay | Admitting: Internal Medicine

## 2012-10-15 ENCOUNTER — Telehealth: Payer: Self-pay | Admitting: Pulmonary Disease

## 2012-10-15 NOTE — Telephone Encounter (Signed)
ATC the number provided, NA and VM box not set up yet so unable to leave a msg, [facility]

## 2012-10-16 ENCOUNTER — Ambulatory Visit (INDEPENDENT_AMBULATORY_CARE_PROVIDER_SITE_OTHER): Payer: Medicare Other | Admitting: Internal Medicine

## 2012-10-16 ENCOUNTER — Encounter: Payer: Self-pay | Admitting: Internal Medicine

## 2012-10-16 ENCOUNTER — Other Ambulatory Visit (INDEPENDENT_AMBULATORY_CARE_PROVIDER_SITE_OTHER): Payer: Medicare Other

## 2012-10-16 VITALS — BP 120/58 | HR 94 | Ht 70.0 in | Wt 206.8 lb

## 2012-10-16 DIAGNOSIS — D696 Thrombocytopenia, unspecified: Secondary | ICD-10-CM | POA: Diagnosis not present

## 2012-10-16 DIAGNOSIS — K5150 Left sided colitis without complications: Secondary | ICD-10-CM

## 2012-10-16 LAB — CBC WITH DIFFERENTIAL/PLATELET
Basophils Absolute: 0.1 10*3/uL (ref 0.0–0.1)
Basophils Relative: 1.4 % (ref 0.0–3.0)
Eosinophils Absolute: 0.2 10*3/uL (ref 0.0–0.7)
HCT: 42.9 % (ref 39.0–52.0)
Hemoglobin: 14.5 g/dL (ref 13.0–17.0)
Lymphocytes Relative: 36.6 % (ref 12.0–46.0)
Lymphs Abs: 2.3 10*3/uL (ref 0.7–4.0)
MCHC: 33.7 g/dL (ref 30.0–36.0)
Monocytes Relative: 11.2 % (ref 3.0–12.0)
Neutro Abs: 3 10*3/uL (ref 1.4–7.7)
RBC: 4.71 Mil/uL (ref 4.22–5.81)
RDW: 13.6 % (ref 11.5–14.6)

## 2012-10-16 NOTE — Telephone Encounter (Signed)
LMTCB x 1 

## 2012-10-16 NOTE — Patient Instructions (Addendum)
Your physician has requested that you go to the basement for the following lab work before leaving today: CBC/Diff  Use your oxygen every night.  We have sent the following medications to your pharmacy for you to pick up at your convenience: sulfasalazine  Thank you for choosing me and [facility].  Iva Boop, M.D., [facility]

## 2012-10-16 NOTE — Progress Notes (Signed)
  Subjective:    Patient ID: [name], male    DOB: 12/21/1935, 77 y.o.   MRN: [id number]  HPI This very pleasant man is here with his wife for followup, he has a segmental left-sided colitis associated with diverticulosis. He has done well on low-dose sulfasalazine. He has not been seen about a year. He has a chronic mild thrombocytopenia known prior to initiation of sulfasalazine I believe. That is unchanged looking back at labs.  Medications, allergies, past medical history, past surgical history, family history and social history are reviewed and updated in the EMR.  Review of Systems Positive for some dyspnea on exertion, he can feel his heart race sometimes. He was started on nocturnal oxygen recently but is only using it intermittently. He has desaturations at night.    Objective:   Physical Exam General:  NAD - overweight Eyes:   anicteric Lungs:  clear Heart:  S1S2 no rubs, murmurs or gallops but irregular rhythm (Afib) Abdomen:  soft and non tender    Data Reviewed:  Lab Results  Component Value Date   WBC 6.3 07/27/2012   HGB 14.2 07/27/2012   HCT 42.9 07/27/2012   MCV 93.9 07/27/2012   PLT 143.0* 07/27/2012        Assessment & Plan:   1. Left sided ulcerative colitis , doing well   2. Thrombocytopenia , mild chronic in stable so far    1. Refill sulfasalazine 500 mg twice a day for the next year 2. Check CBC today, results below 3. I advised him to use oxygen every night as it could help improve his overall health and outcomes and reduce chances of heart failure   Lab Results  Component Value Date   WBC 6.3 10/16/2012   HGB 14.5 10/16/2012   HCT 42.9 10/16/2012   MCV 91.0 10/16/2012   PLT 154.0 10/16/2012    I appreciate the opportunity to care for this patient. CC: Marga Melnick, MD, Marcelyn Bruins, MD

## 2012-10-17 ENCOUNTER — Other Ambulatory Visit: Payer: Self-pay

## 2012-10-17 DIAGNOSIS — D649 Anemia, unspecified: Secondary | ICD-10-CM

## 2012-10-17 NOTE — Telephone Encounter (Signed)
Spouse states the pt had an appr with GI on 10/16/12 and she brought the form by and had KC complete. Nothing further is needed at this time.

## 2012-10-17 NOTE — Progress Notes (Signed)
Quick Note:  Blood counts are fine Should have a CBC again in 4 months ______

## 2012-10-18 ENCOUNTER — Telehealth: Payer: Self-pay

## 2012-10-18 NOTE — Telephone Encounter (Signed)
Katie from the [employer] called, patient just picked up his sulfasalazine and it had DR behind it, she wanted to know if that was ok, she realized it after he left.  She is going to call him back and switch it out since we didn't order that kind.

## 2012-11-06 ENCOUNTER — Ambulatory Visit (INDEPENDENT_AMBULATORY_CARE_PROVIDER_SITE_OTHER): Payer: Medicare Other | Admitting: *Deleted

## 2012-11-06 DIAGNOSIS — Z7901 Long term (current) use of anticoagulants: Secondary | ICD-10-CM

## 2012-11-06 DIAGNOSIS — Z8679 Personal history of other diseases of the circulatory system: Secondary | ICD-10-CM | POA: Diagnosis not present

## 2012-11-06 DIAGNOSIS — I4892 Unspecified atrial flutter: Secondary | ICD-10-CM | POA: Diagnosis not present

## 2012-12-03 ENCOUNTER — Encounter: Payer: Self-pay | Admitting: Internal Medicine

## 2012-12-03 ENCOUNTER — Ambulatory Visit (INDEPENDENT_AMBULATORY_CARE_PROVIDER_SITE_OTHER): Payer: Medicare Other | Admitting: Internal Medicine

## 2012-12-03 VITALS — BP 128/72 | HR 72 | Temp 97.5°F | Wt 201.0 lb

## 2012-12-03 DIAGNOSIS — H1010 Acute atopic conjunctivitis, unspecified eye: Secondary | ICD-10-CM

## 2012-12-03 MED ORDER — FLUTICASONE PROPIONATE 50 MCG/ACT NA SUSP: 1.0000 | Freq: Two times a day (BID) | NASAL | Status: DC | PRN

## 2012-12-03 NOTE — Patient Instructions (Addendum)

## 2012-12-03 NOTE — Progress Notes (Signed)
  Subjective:    Patient ID: [name], male    DOB: October 16, 1935, 77 y.o.   MRN: [id number]  HPI The respiratory tract symptoms began 7-10 days ago as rhinitis & head congestion.      No exposures reported  to sick family, friends,or allergens.  Environmental factors as triggers include wood burning stove.     Significant active  associated symptoms include persistent nasal congestion w/o discharge.  No cough is present. Wheezing  & dyspnea reported; MDI helps.   Extrinsic symptoms of itchy/ watery eyes &  sneezing were present .   Flu shot current   Treatment with NetiMed,  Benadryl,   Albuterol MDI  was partially effective. There is no history of asthma . Some  seasonal  allergies.  The patient quit smoking in 2004                   Review of Systems Symptoms not present include frontal headache, facial pain, dental pain, sore throat, nasal purulence,   earache, &  otic discharge. Fever, chills, sweats were not present .  Myalgias and arthralgias were not present.       Objective:   Physical Exam  General appearance:good health ;well nourished; no acute distress or increased work of breathing is present.   Eyes: No conjunctival inflammation or lid edema is present.  Ears:  External ear exam shows no significant lesions or deformities.  Otoscopic examination reveals clear canals, tympanic membranes are intact bilaterally without bulging, retraction, inflammation or discharge. Nose:  External nasal examination shows no deformity or inflammation. Nasal mucosa are dry  without lesions or exudates. Leftward  septal  deviation.No obstruction to airflow.  Oral exam: Dentures; lips and gums are healthy appearing.There is mild oropharyngeal erythema . No exudate noted.  Neck:  No deformities, masses, or tenderness noted.    Heart:  Normal rate and irregular rhythm (known AF). S1 and S2 normal without gallop, click, rub or murmur.   Lungs:Chest clear to  auscultation; no wheezes, rhonchi,rales ,or rubs present.No increased work of breathing.  Decreased BS Extremities:  No cyanosis or edema. Slight clubbing  noted  No  lymphadenopathy about the head, neck, or axilla noted.  Skin: Warm & dry         Assessment & Plan:  #1 nasal congestion with extrinsic symptoms  Plan: See orders and recommendations

## 2012-12-18 ENCOUNTER — Ambulatory Visit (INDEPENDENT_AMBULATORY_CARE_PROVIDER_SITE_OTHER): Payer: Medicare Other | Admitting: Internal Medicine

## 2012-12-18 ENCOUNTER — Ambulatory Visit (INDEPENDENT_AMBULATORY_CARE_PROVIDER_SITE_OTHER): Payer: Medicare Other | Admitting: Pharmacist

## 2012-12-18 ENCOUNTER — Ambulatory Visit (HOSPITAL_BASED_OUTPATIENT_CLINIC_OR_DEPARTMENT_OTHER)
Admission: RE | Admit: 2012-12-18 | Discharge: 2012-12-18 | Disposition: A | Discharge status: Home / Self Care | Payer: Medicare Other | Source: Ambulatory Visit | Attending: Internal Medicine | Admitting: Internal Medicine

## 2012-12-18 VITALS — BP 140/78 | HR 58 | Temp 97.8°F | Resp 16 | Wt 202.0 lb

## 2012-12-18 DIAGNOSIS — J441 Chronic obstructive pulmonary disease with (acute) exacerbation: Secondary | ICD-10-CM

## 2012-12-18 DIAGNOSIS — I4892 Unspecified atrial flutter: Secondary | ICD-10-CM

## 2012-12-18 DIAGNOSIS — Z7901 Long term (current) use of anticoagulants: Secondary | ICD-10-CM

## 2012-12-18 DIAGNOSIS — J209 Acute bronchitis, unspecified: Secondary | ICD-10-CM

## 2012-12-18 DIAGNOSIS — Z8679 Personal history of other diseases of the circulatory system: Secondary | ICD-10-CM

## 2012-12-18 DIAGNOSIS — J449 Chronic obstructive pulmonary disease, unspecified: Secondary | ICD-10-CM | POA: Diagnosis not present

## 2012-12-18 MED ORDER — PREDNISONE 20 MG PO TABS: 20.0000 mg | ORAL_TABLET | Freq: Two times a day (BID) | ORAL | Status: DC

## 2012-12-18 MED ORDER — AMOXICILLIN-POT CLAVULANATE 875-125 MG PO TABS: 1.0000 | ORAL_TABLET | Freq: Two times a day (BID) | ORAL | Status: DC

## 2012-12-18 NOTE — Progress Notes (Signed)
  Subjective:    Patient ID: [name], male    DOB: [birth year], 77 y.o.   MRN: [id number]  HPI  He developed a cough productive of yellow sputum 3/31. This was associated with wheezing and shortness of breath. This significantly impacted his sleep last night. He also has been limited with dyspnea on exertion. He describes burning in eyes & frontal pressure.  Mucinex has been of partial benefit only.      Review of Systems  No fever , chills or sweats present  He denies facial pain, nasal purulence, dental pain, sore throat, otic pain, otic discharge.     Objective:   Physical Exam General appearance:well nourished; no acute distress or increased work of breathing is present.  No  lymphadenopathy about the head, neck, or axilla noted.   Eyes: No conjunctival inflammation or lid edema is present. EOMI . Vision intact  Ears:  External ear exam shows no significant lesions or deformities.  Otoscopic examination reveals clear canals, tympanic membranes are intact bilaterally without bulging, retraction, inflammation or discharge.  Nose:  External nasal examination shows no deformity or inflammation. Nasal mucosa are dry without lesions or exudates. No septal dislocation or deviation.No obstruction to airflow.   Oral exam: Dentures; lips and gums are healthy appearing.There is no oropharyngeal erythema or exudate noted.   Neck:  No deformities,  masses, or tenderness noted.   Supple with full range of motion without pain.   Heart:  Irregular rate & rhythm.  Lungs:Chest clear to auscultation; no wheezes, rhonchi,rales ,or rubs present.No increased work of breathing.  Decreased breath sounds   Extremities:  No cyanosis or clubbing  noted . Trace edema   Skin: Warm & dry          Assessment & Plan:  #1 acute bronchitis w/o bronchospasm  Plan: See orders and recommendations

## 2012-12-18 NOTE — Patient Instructions (Addendum)
Order for x-rays entered into  the computer; these will be performed at [facility]. No appointment is necessary.    

## 2012-12-19 ENCOUNTER — Telehealth: Payer: Self-pay

## 2012-12-19 MED ORDER — FUROSEMIDE 40 MG PO TABS: 40.0000 mg | ORAL_TABLET | Freq: Every day | ORAL | Status: DC

## 2012-12-19 NOTE — Telephone Encounter (Signed)
Message copied by Maurice Small on Wed Dec 19, 2012  2:18 PM ------      Message from: Pecola Lawless      Created: Wed Dec 19, 2012 11:59 AM       Send Rx for Furosemide 40 mg qd #30; appt 1 week ------

## 2012-12-19 NOTE — Telephone Encounter (Signed)
Spoke with patient's wife, verbalized understanding of information

## 2012-12-19 NOTE — Telephone Encounter (Signed)
Mild heart failure suggested with chronic bronchitic changes. Take furosemide 40 mg daily. Please followup in one week or sooner if symptoms fail to improve with present regimen.      Left message on VM informing patient to return call when available. Reason for call (not left on VM) discuss above results. Per Dr Alwyn Ren have patient f/u in 1 week regardless if symptoms improve.

## 2012-12-24 ENCOUNTER — Ambulatory Visit (INDEPENDENT_AMBULATORY_CARE_PROVIDER_SITE_OTHER): Payer: Medicare Other | Admitting: Internal Medicine

## 2012-12-24 ENCOUNTER — Encounter: Payer: Self-pay | Admitting: Internal Medicine

## 2012-12-24 VITALS — BP 132/84 | HR 142 | Temp 98.5°F | Wt 195.0 lb

## 2012-12-24 DIAGNOSIS — I4892 Unspecified atrial flutter: Secondary | ICD-10-CM | POA: Diagnosis not present

## 2012-12-24 DIAGNOSIS — I509 Heart failure, unspecified: Secondary | ICD-10-CM

## 2012-12-24 DIAGNOSIS — J441 Chronic obstructive pulmonary disease with (acute) exacerbation: Secondary | ICD-10-CM | POA: Diagnosis not present

## 2012-12-24 LAB — CBC WITH DIFFERENTIAL/PLATELET
Basophils Absolute: 0 10*3/uL (ref 0.0–0.1)
Basophils Relative: 0.3 % (ref 0.0–3.0)
Eosinophils Absolute: 0 10*3/uL (ref 0.0–0.7)
HCT: 50.7 % (ref 39.0–52.0)
Hemoglobin: 17.1 g/dL — ABNORMAL HIGH (ref 13.0–17.0)
Lymphs Abs: 2.4 10*3/uL (ref 0.7–4.0)
MCHC: 33.8 g/dL (ref 30.0–36.0)
Neutro Abs: 7.6 10*3/uL (ref 1.4–7.7)
RBC: 5.63 Mil/uL (ref 4.22–5.81)
RDW: 14.4 % (ref 11.5–14.6)

## 2012-12-24 LAB — BASIC METABOLIC PANEL
BUN: 26 mg/dL — ABNORMAL HIGH (ref 6–23)
CO2: 25 mEq/L (ref 19–32)
Calcium: 9.1 mg/dL (ref 8.4–10.5)
Chloride: 100 mEq/L (ref 96–112)
Creatinine, Ser: 1.4 mg/dL (ref 0.4–1.5)
Glucose, Bld: 135 mg/dL — ABNORMAL HIGH (ref 70–99)

## 2012-12-24 LAB — MAGNESIUM: Magnesium: 2.2 mg/dL (ref 1.5–2.5)

## 2012-12-24 NOTE — Progress Notes (Signed)
  Subjective:    Patient ID: [name], male    DOB: [birth year], 77 y.o.   MRN: [id number]  HPI  He's had some cough; he did see a speck of blood in the discolored sputum 2 days. He continues to have some cough at night after reclining. He's had no significant daytime cough. Albuterol has been of benefit for his cough. He states that he is "100%" better taking the furosemide for radiographic congestive heart failure. Chest x-ray was reviewed; there is vascular accentuation. There is also increased interstitial markings. There is mild blunting of left costophrenic angle.    Review of Systems  He denies fever, chills, or sweats.  He remains in flutter fib. PT/INR was therapeutic at 2.5 last week.  His wife relates that he had a sleep study; was placed on nocturnal oxygen. He is not compliant with nocturnal oxygen      Objective:   Physical Exam  Appears healthy and well-nourished & in no acute distress  No carotid bruits are present.No neck pain distention present at 10 - 15 degrees. Thyroid normal to palpation  Tachyarrhythmia present with pulse rate over 120.  Chest is clear with no increased work of breathing  There is no evidence of aortic aneurysm or renal artery bruits  Abdomen soft with no organomegaly or masses. No HJR  No clubbing, cyanosis or edema present.  Pedal pulses are intact but decreased  No ischemic skin changes are present .    Alert and oriented. Strength, tone normal             Assessment & Plan:  #1 atrial flutter/fib. Rate uncontrolled #2 radiographic congestive heart failure. This may be exacerbated by his failure to implement nocturnal oxygen as well as lack of sodium restriction. #3 tobacco related COPD. Albuterol has been of benefit but is problematic with tachycardia. We'll assess his response to a sample of Breo for his chronic obstructive lung disease. A sample was given to be used 1 inhalation daily. If this is effective for his  cough; prescription will be filled   Plan: Cardiology followup and be pursued. Sodium restriction will be implemented.As per Dr Jens Som Diltiazem 120 mg was added

## 2012-12-24 NOTE — Patient Instructions (Addendum)
Is essential to use the oxygen at night to help prevent heart failure. Your BP goal = AVERAGE < 135/85. Avoid ingestion of  excess salt/sodium.Cook with pepper & other spices . Use the salt substitute "No Salt"(unless your potassium has been elevated) OR the Mrs Sharilyn Sites products to season food @ the table. Avoid foods which taste salty or "vinegary" as their sodium contentet will be high. Please use 1 inhalation of the Breo sample each day. Gargle and spit after use.

## 2012-12-26 ENCOUNTER — Ambulatory Visit (INDEPENDENT_AMBULATORY_CARE_PROVIDER_SITE_OTHER): Payer: Medicare Other | Admitting: Pulmonary Disease

## 2012-12-26 ENCOUNTER — Encounter: Payer: Self-pay | Admitting: Physician Assistant

## 2012-12-26 ENCOUNTER — Ambulatory Visit: Payer: Medicare Other

## 2012-12-26 ENCOUNTER — Ambulatory Visit (INDEPENDENT_AMBULATORY_CARE_PROVIDER_SITE_OTHER): Payer: Medicare Other | Admitting: Physician Assistant

## 2012-12-26 ENCOUNTER — Encounter: Payer: Self-pay | Admitting: *Deleted

## 2012-12-26 ENCOUNTER — Other Ambulatory Visit: Payer: Self-pay | Admitting: *Deleted

## 2012-12-26 ENCOUNTER — Encounter: Payer: Self-pay | Admitting: Pulmonary Disease

## 2012-12-26 VITALS — BP 124/62 | HR 92 | Ht 70.0 in | Wt 196.0 lb

## 2012-12-26 VITALS — BP 106/60 | HR 80 | Temp 97.5°F | Ht 67.5 in | Wt 198.2 lb

## 2012-12-26 DIAGNOSIS — I4891 Unspecified atrial fibrillation: Secondary | ICD-10-CM | POA: Diagnosis not present

## 2012-12-26 DIAGNOSIS — J4489 Other specified chronic obstructive pulmonary disease: Secondary | ICD-10-CM

## 2012-12-26 DIAGNOSIS — I509 Heart failure, unspecified: Secondary | ICD-10-CM | POA: Diagnosis not present

## 2012-12-26 DIAGNOSIS — I2510 Atherosclerotic heart disease of native coronary artery without angina pectoris: Secondary | ICD-10-CM

## 2012-12-26 DIAGNOSIS — J441 Chronic obstructive pulmonary disease with (acute) exacerbation: Secondary | ICD-10-CM

## 2012-12-26 DIAGNOSIS — Z9889 Other specified postprocedural states: Secondary | ICD-10-CM | POA: Diagnosis not present

## 2012-12-26 DIAGNOSIS — J209 Acute bronchitis, unspecified: Secondary | ICD-10-CM | POA: Diagnosis not present

## 2012-12-26 DIAGNOSIS — I10 Essential (primary) hypertension: Secondary | ICD-10-CM

## 2012-12-26 DIAGNOSIS — I5030 Unspecified diastolic (congestive) heart failure: Secondary | ICD-10-CM

## 2012-12-26 DIAGNOSIS — J449 Chronic obstructive pulmonary disease, unspecified: Secondary | ICD-10-CM | POA: Diagnosis not present

## 2012-12-26 DIAGNOSIS — R7309 Other abnormal glucose: Secondary | ICD-10-CM

## 2012-12-26 MED ORDER — DILTIAZEM HCL ER COATED BEADS 240 MG PO CP24: 240.0000 mg | ORAL_CAPSULE | Freq: Every day | ORAL | Status: DC

## 2012-12-26 MED ORDER — LEVOFLOXACIN 750 MG PO TABS: 750.0000 mg | ORAL_TABLET | Freq: Every day | ORAL | Status: DC

## 2012-12-26 MED ORDER — DILTIAZEM HCL ER COATED BEADS 120 MG PO CP24: 120.0000 mg | ORAL_CAPSULE | Freq: Every day | ORAL | Status: DC

## 2012-12-26 MED ORDER — PREDNISONE 10 MG PO TABS: ORAL_TABLET | ORAL | Status: DC

## 2012-12-26 NOTE — Assessment & Plan Note (Signed)
The pt is still having shortness of breath above usual baseline, and is secondary to a persistent acute bronchitis.  Will extend his abx course, and retreat with a prednisone taper.

## 2012-12-26 NOTE — Progress Notes (Signed)
  Subjective:    Patient ID: [name], male    DOB: [birth year], 77 y.o.   MRN: [id number]  HPI The patient comes in today for an acute sick visit.  He has known COPD, and really has not responded to any of the usual bronchodilator regimens.  He is supposed to be on oxygen at night while sleeping, but rarely uses this.  Most recently he has started back.  He is been having a one to two-week history of increased chest congestion, cough with purulent mucus, and some increased shortness of breath.  He has also had some increased heart rate that has led to some mild heart failure noted on chest x-ray.  He has had some improvement with a short course of antibiotics and prednisone, as well as diuretics.  However, he continues to have purulent mucus, though has decreased somewhat in quantity.  He is still having a significant cough and dyspnea on exertion.  He denies any fever or chest pain.   Review of Systems  Constitutional: Negative for fever and unexpected weight change.  HENT: Positive for congestion, rhinorrhea, postnasal drip and sinus pressure. Negative for ear pain, nosebleeds, sore throat, sneezing, trouble swallowing and dental problem.   Eyes: Negative for redness and itching.  Respiratory: Positive for cough, chest tightness, shortness of breath and wheezing.   Cardiovascular: Negative for palpitations and leg swelling.  Gastrointestinal: Negative for nausea and vomiting.  Genitourinary: Negative for dysuria.  Musculoskeletal: Negative for joint swelling.  Skin: Negative for rash.  Neurological: Positive for headaches.  Hematological: Does not bruise/bleed easily.  Psychiatric/Behavioral: Negative for dysphoric mood. The patient is not nervous/anxious.        Objective:   Physical Exam Overweight male in no acute distress Nose without purulence or discharge noted Oropharynx clear Neck without lymphadenopathy or thyromegaly Chest with a few rhonchi, decreased breath sounds, but  no active wheezing or crackles Cardiac exam with regular rhythm Lower extremities with minimal ankle edema, no cyanosis Alert and oriented, moves all 4 extremities.       Assessment & Plan:

## 2012-12-26 NOTE — Progress Notes (Signed)
[address] [place], [place]  [address] Phone: [phone] Fax:  [phone]  Date:  12/26/2012   ID:  [name], DOB [birth year], MRN [id number]  PCP:  Marga Melnick, MD  Primary Cardiologist:  Dr. Olga Millers     History of Present Illness: [name] is a 77 y.o. male who returns for f/u on CHF.    He has a history of mitral valve repair, atrial fibrillation/flutter, status post MAZE procedure, nonobstructive CAD, prior embolic CVA after initial mitral valve repair secondary to endocarditis requiring redo surgery (repair), COPD, HL, HTN. LHC 12/06: Mid RCA 40-50%.  Echo 11/13: EF 55%, trivial AI, mitral valve ring prosthesis present with mild stenosis (mean gradient 7 mm of mercury), moderate LAE, mild to moderate RVE, moderately reduced RVSF, moderate RAE, PASP 46.  He saw Dr. Alwyn Ren recently with increased dyspnea and productive cough.  He was placed on antibiotics. His chest x-ray suggested edema. His heart rate is controlled in atrial fibrillation and diltiazem was added to his medical regimen.  He was also placed on Lasix.  He has a history of chronic dyspnea with exertion (NYHA class III).  His breathing improved Lasix back to his baseline. He continues to have a significant cough productive of purulent sputum. He denies fevers. He denies wheezing. He has occasional chest tightness. He has had this over the years with his COPD without significant change. He denies syncope. He denies orthopnea, PND or edema.  Labs (4/14): WBC 11.1, Hgb 17.1, TSH 1.02, K 3.8, creatinine 1.4, BNP 102  Wt Readings from Last 3 Encounters:  12/26/12 196 lb (88.905 kg)  12/24/12 195 lb (88.451 kg)  12/18/12 202 lb (91.627 kg)     Past Medical History  Diagnosis Date  . Mediastinal lymphadenopathy   . History of prostate cancer   . Atrial fibrillation   . COPD (chronic obstructive pulmonary disease)   . CAD (coronary artery disease)   . Adrenal adenoma   .  Warthin's tumor   . Dyslipidemia   . Diverticulosis   . Embolic stroke     d/t endocarditis 2008  . Atrial flutter   . Ulcerative colitis     left-sided, associated with diverticulosis  . Obstructive chronic bronchitis   . HTN (hypertension)   . Thrombocytopenia   . Emphysema   . Right thyroid nodule   . Hemorrhoids   . TIA (transient ischemic attack) 03/25/2011    ? of due to transient diplopia    Current Outpatient Prescriptions  Medication Sig Dispense Refill  . diltiazem (CARDIZEM CD) 240 MG 24 hr capsule Take 1 capsule (240 mg total) by mouth daily.  90 capsule  3  . diltiazem (CARDIZEM) 120 MG tablet Take 120 mg by mouth daily.      . Fluticasone Furoate-Vilanterol (BREO ELLIPTA) 100-25 MCG/INH AEPB Inhale 1 puff into the lungs daily.      . furosemide (LASIX) 40 MG tablet Take 1 tablet (40 mg total) by mouth daily.  90 tablet  0  . pravastatin (PRAVACHOL) 40 MG tablet Take 1 tablet (40 mg total) by mouth every evening.  30 tablet  12  . VENTOLIN HFA 108 (90 BASE) MCG/ACT inhaler       . warfarin (COUMADIN) 5 MG tablet Take 1 tablet (5 mg total) by mouth daily.  120 tablet  1   No current facility-administered medications for this visit.    Allergies:   No Known Allergies  Social History:  The patient  reports that he quit smoking about 10 years ago. His smoking use included Cigarettes. He has a 180 pack-year smoking history. He has never used smokeless tobacco. He reports that he does not drink alcohol or use illicit drugs.   ROS:  Please see the history of present illness.   He denies fevers, melena, hematochezia, vomiting, diarrhea.   All other systems reviewed and negative.   PHYSICAL EXAM: VS:  BP 124/62  Pulse 92  Ht 5\' 10"  (1.778 m)  Wt 196 lb (88.905 kg)  BMI 28.12 kg/m2 Well nourished, well developed, in no acute distress HEENT: normal Neck: no JVD Cardiac:  normal S1, S2; irregularly irregular rhythm; no murmur Lungs:  Decreased breath sounds bilaterally,  no wheezing, rhonchi or rales Abd: soft, nontender, no hepatomegaly Ext: no edema Skin: warm and dry Neuro:  CNs 2-12 intact, no focal abnormalities noted  EKG:  Atrial fibrillation, HR 92, right axis deviation, PVCs, nonspecific ST-T wave changes, no change from prior tracing     ASSESSMENT AND PLAN:  1. Heart Failure with Preserved Ejection Fraction (HFpEF):  I suspect he had mild volume overload in the setting of COPD exacerbation that caused uncontrolled ventricular rate with atrial fibrillation.  His volume is improved. His weight is down. His BNP 2 days ago was close to normal. He has chronic dyspnea with exertion related to COPD. He continues to have a cough which I think is related to his COPD exacerbation. He had an echocardiogram in 11/13. He does not have anything on exam to suggest mitral regurgitation. I do not think that he needs a follow up echocardiogram at this time. Continue current therapy. 2. COPD Exacerbation:  He continues to have a cough. He has completed his antibiotics and prednisone. We have arranged for early followup at Dr. Teddy Spike office today. 3. CAD:  No symptoms suggestive of angina. He is not on aspirin as he is on Coumadin. Continue statin. 4. Atrial Fibrillation:  Rate better controlled. Continue current dose of diltiazem. Continue Coumadin. 5. Hyperlipidemia:  Continue statin. 6. Disposition: Follow up with me in 2-3 weeks.  Luna Glasgow, PA-C  9:31 AM 12/26/2012

## 2012-12-26 NOTE — Patient Instructions (Addendum)
Your physician has recommended you make the following change in your medication: INCREASE CARDIZEM TO (360 MG) DAILY, TAKE ONE PILL (120 MG) AND ONE PILL OF (240 MG) DAILY, TOGETHER. I SENT IN SCRIPT TODAY FOR THE (120 MG) TO [facility]   Your physician recommends that you schedule a follow-up appointment WITH SCOTT WEAVER, PA-C WITH DR. CRENSHAW IN THE OFFICE ON January 16, 2013 AT 10:10 AM  Your physician recommends that you KEEP YOUR appointment TODAY WITH DR. CLANCE AT 12:00 PM,

## 2012-12-26 NOTE — Assessment & Plan Note (Signed)
The pt is better with a short course of augmentin, but still with purulent mucus and congestion.  Will treat for an additional 5 days with fluoroquinolone.

## 2012-12-26 NOTE — Patient Instructions (Addendum)
Stay on breo one inhalation a day for a TOTAL of one month to see if this helps your breathing.  Please call us to let us know either way. Will treat with levaquin 750mg  one a day for 5 days, to finish up treatment of your bronchitis. Will treat with prednisone a little longer to help with breathing and cough. Can take robitussin dm otc for cough as well.  It is of the utmost importance for you to wear your oxygen everynight all night.  Can use nasal saline spray to keep noise moist if it irritates your nasal passages.  followup with me in 4 mos.

## 2012-12-27 ENCOUNTER — Encounter: Payer: Self-pay | Admitting: *Deleted

## 2013-01-04 ENCOUNTER — Ambulatory Visit: Payer: Medicare Other | Admitting: Pulmonary Disease

## 2013-01-07 ENCOUNTER — Ambulatory Visit: Payer: Medicare Other | Admitting: Pulmonary Disease

## 2013-01-16 ENCOUNTER — Telehealth: Payer: Self-pay | Admitting: *Deleted

## 2013-01-16 ENCOUNTER — Encounter: Payer: Self-pay | Admitting: Physician Assistant

## 2013-01-16 ENCOUNTER — Telehealth: Payer: Self-pay | Admitting: Pulmonary Disease

## 2013-01-16 ENCOUNTER — Ambulatory Visit (INDEPENDENT_AMBULATORY_CARE_PROVIDER_SITE_OTHER): Payer: Medicare Other | Admitting: Physician Assistant

## 2013-01-16 VITALS — BP 114/62 | HR 98 | Ht 67.5 in | Wt 198.8 lb

## 2013-01-16 DIAGNOSIS — I4891 Unspecified atrial fibrillation: Secondary | ICD-10-CM

## 2013-01-16 DIAGNOSIS — Z9889 Other specified postprocedural states: Secondary | ICD-10-CM

## 2013-01-16 DIAGNOSIS — I509 Heart failure, unspecified: Secondary | ICD-10-CM

## 2013-01-16 DIAGNOSIS — J4489 Other specified chronic obstructive pulmonary disease: Secondary | ICD-10-CM

## 2013-01-16 DIAGNOSIS — I2510 Atherosclerotic heart disease of native coronary artery without angina pectoris: Secondary | ICD-10-CM | POA: Diagnosis not present

## 2013-01-16 DIAGNOSIS — I5030 Unspecified diastolic (congestive) heart failure: Secondary | ICD-10-CM

## 2013-01-16 DIAGNOSIS — J449 Chronic obstructive pulmonary disease, unspecified: Secondary | ICD-10-CM

## 2013-01-16 LAB — BASIC METABOLIC PANEL
BUN: 15 mg/dL (ref 6–23)
Chloride: 105 mEq/L (ref 96–112)
Potassium: 3.8 mEq/L (ref 3.5–5.1)

## 2013-01-16 MED ORDER — FLUTICASONE FUROATE-VILANTEROL 100-25 MCG/INH IN AEPB: 1.0000 | INHALATION_SPRAY | Freq: Every day | RESPIRATORY_TRACT | Status: DC

## 2013-01-16 NOTE — Telephone Encounter (Signed)
pt here in office seeing [name], [facility] today and states he is almost out of sample of new inhaler Breo given to him by Dr. Alwyn Ren. I said that I would call about possible samples w/Dr. Shelle Iron his pulmonologist. s/w triage RN said have ptcb to dicuss how inhaler is working for pt. I advised pt of this and he said that was fine and he will call when he gets home. Pt said thank you to me for my effort. I told [name] he was welcome.

## 2013-01-16 NOTE — Telephone Encounter (Signed)
If the breo helped him, I would like for him to continue on this.

## 2013-01-16 NOTE — Telephone Encounter (Signed)
pt's wife notified about lab results with verbal understanding 

## 2013-01-16 NOTE — Patient Instructions (Addendum)
LAB TODAY BMET  PLEASE FOLLOW UP WITH DR. CRENSHAW 04/22/13 @ 10 AM

## 2013-01-16 NOTE — Telephone Encounter (Signed)
Spoke with Okey Regal  She states that the pt is needing sample of Breo, is there seeing the PA now I advised her that the plan was for the pt to call and advise how is doing on med before we prescribe He was originally given samples per Dr Alwyn Ren If med is helping we can ask [facility] about calling in rx, but no samples to be given w/o ov Okey Regal verbalized understanding, and will relay the msg to the pt and will have him call the office today Will await call back from the pt

## 2013-01-16 NOTE — Telephone Encounter (Signed)
Spoke with spouse and notified of recs per [facility] Rx was sent to pharm

## 2013-01-16 NOTE — Telephone Encounter (Signed)
KC, please advise if okay to send RX in for patient. Pt is waiting on answer now. Thanks.

## 2013-01-16 NOTE — Progress Notes (Signed)
[address] [place], [place]  [address] Phone: [phone] Fax:  [phone]  Date:  01/16/2013   ID:  [name], DOB 1936/09/17, MRN [id number]  PCP:  Marga Melnick, MD  Primary Cardiologist:  Dr. Olga Millers     History of Present Illness: [name] is a 77 y.o. male who returns for f/u on CHF.    He has a hx of MV repair, AFib/flutter, s/p MAZE procedure, nonobstructive CAD, prior embolic CVA after initial MV repair secondary to endocarditis requiring redo surgery (repair), COPD, HL, HTN. LHC 12/06: Mid RCA 40-50%.  Echo 11/13: EF 55%, trivial AI, mitral valve ring prosthesis present with mild stenosis (mean gradient 7 mm of mercury), moderate LAE, mild to moderate RVE, moderately reduced RVSF, moderate RAE, PASP 46.  I saw him recently after he had seen Dr. Alwyn Ren with increased dyspnea and productive cough.  He was placed on antibiotics. His CXR suggested edema and HR was uncontrolled in AFib.  Diltiazem and Lasix was added to his medical regimen.  He has a hx of chronic dyspnea with exertion (NYHA class III).  His breathing improved with Lasix back to baseline. However, he continued to have productive cough.  I sent him to Dr. Shelle Iron who put him on more antibiotics and prednisone.  He is doing better.  Breathing is stable.  Cough is improved.  No fevers.  No chest pain.  No syncope.  No orthopnea, PND, edema.   Labs (4/14): WBC 11.1, Hgb 17.1, TSH 1.02, K 3.8, creatinine 1.4, BNP 102  Wt Readings from Last 3 Encounters:  01/16/13 198 lb 12.8 oz (90.175 kg)  12/26/12 198 lb 3.2 oz (89.903 kg)  12/26/12 196 lb (88.905 kg)     Past Medical History  Diagnosis Date  . Mediastinal lymphadenopathy   . History of prostate cancer   . Atrial fibrillation   . COPD (chronic obstructive pulmonary disease)   . CAD (coronary artery disease)   . Adrenal adenoma   . Warthin's tumor   . Dyslipidemia   . Diverticulosis   . Embolic stroke     d/t  endocarditis 2008  . Atrial flutter   . Ulcerative colitis     left-sided, associated with diverticulosis  . Obstructive chronic bronchitis   . HTN (hypertension)   . Thrombocytopenia   . Emphysema   . Right thyroid nodule   . Hemorrhoids   . TIA (transient ischemic attack) 03/25/2011    ? of due to transient diplopia    Current Outpatient Prescriptions  Medication Sig Dispense Refill  . diltiazem (CARDIZEM CD) 120 MG 24 hr capsule Take 1 capsule (120 mg total) by mouth daily.  90 capsule  3  . diltiazem (CARDIZEM CD) 240 MG 24 hr capsule Take 1 capsule (240 mg total) by mouth daily.  90 capsule  3  . Fluticasone Furoate-Vilanterol (BREO ELLIPTA) 100-25 MCG/INH AEPB Inhale 1 puff into the lungs daily.      . furosemide (LASIX) 40 MG tablet Take 1 tablet (40 mg total) by mouth daily.  90 tablet  0  . pravastatin (PRAVACHOL) 40 MG tablet Take 1 tablet (40 mg total) by mouth every evening.  30 tablet  12  . warfarin (COUMADIN) 5 MG tablet Take 5 mg by mouth daily. 7.5mg  Mon, Wed, Fri and 5mg  the other days.       No current facility-administered medications for this visit.    Allergies:   No Known Allergies  Social History:  The patient  reports that he quit smoking about 10 years ago. His smoking use included Cigarettes. He has a 180 pack-year smoking history. He has never used smokeless tobacco. He reports that he does not drink alcohol or use illicit drugs.   ROS:  Please see the history of present illness.   All other systems reviewed and negative.   PHYSICAL EXAM: VS:  BP 114/62  Pulse 98  Ht 5' 7.5" (1.715 m)  Wt 198 lb 12.8 oz (90.175 kg)  BMI 30.66 kg/m2 Well nourished, well developed, in no acute distress HEENT: normal Neck: no JVD Cardiac:  normal S1, S2; irregularly irregular rhythm; no murmur Lungs:  Decreased breath sounds bilaterally, no wheezing, rhonchi or rales Abd: soft, nontender, no hepatomegaly Ext: no edema Skin: warm and dry Neuro:  CNs 2-12 intact, no  focal abnormalities noted  EKG:  Atrial fibrillation, HR 98, right axis deviation, PVCs, nonspecific ST-T wave changes, no change from prior tracing     ASSESSMENT AND PLAN:  1. Heart Failure with Preserved Ejection Fraction (HFpEF):  This was in the setting of COPD exacerbation.  Volume now stable.  He remains on Lasix.  check BMET today.   2. COPD:  F/u with Dr. Shelle Iron as planned. 3. CAD:  No symptoms suggestive of angina. He is not on aspirin as he is on Coumadin. Continue statin. 4. Atrial Fibrillation:  Rate controlled. Continue current dose of diltiazem. Continue Coumadin. 5. Hyperlipidemia:  Continue statin. 6. S/p Mitral Valve Repair:  Recent echo stable.  Continue SBE prophylaxis.   7. Disposition: Follow up with Dr. Olga Millers in 3 mos.  Signed, [name], PA-C  10:45 AM 01/16/2013

## 2013-01-16 NOTE — Telephone Encounter (Signed)
Message copied by Tarri Fuller on Wed Jan 16, 2013  5:41 PM ------      Message from: [place], [place] T      Created: Wed Jan 16, 2013  5:05 PM       Potassium okay      Creatinine stable      Continue with current treatment plan.      [name], PA-C  5:05 PM 01/16/2013 ------

## 2013-01-16 NOTE — Telephone Encounter (Signed)
LMTCB for [name] 

## 2013-01-16 NOTE — Telephone Encounter (Signed)
Pt's wife called stating that pt is doing well on breo.  Would like a Rx called into [facility] in [place].  Pt's spouse can be reached at [phone].  [name]

## 2013-01-29 ENCOUNTER — Ambulatory Visit (INDEPENDENT_AMBULATORY_CARE_PROVIDER_SITE_OTHER): Payer: Medicare Other | Admitting: *Deleted

## 2013-01-29 DIAGNOSIS — Z7901 Long term (current) use of anticoagulants: Secondary | ICD-10-CM

## 2013-01-29 DIAGNOSIS — I4892 Unspecified atrial flutter: Secondary | ICD-10-CM

## 2013-01-29 DIAGNOSIS — Z8679 Personal history of other diseases of the circulatory system: Secondary | ICD-10-CM | POA: Diagnosis not present

## 2013-01-29 LAB — POCT INR: INR: 2.9

## 2013-02-12 ENCOUNTER — Other Ambulatory Visit: Payer: Self-pay | Admitting: Cardiology

## 2013-02-14 ENCOUNTER — Telehealth: Payer: Self-pay

## 2013-02-14 NOTE — Telephone Encounter (Signed)
I spoke with the patient and notified him to come for labs at his next convenience

## 2013-02-19 ENCOUNTER — Other Ambulatory Visit (INDEPENDENT_AMBULATORY_CARE_PROVIDER_SITE_OTHER): Payer: Medicare Other

## 2013-02-19 DIAGNOSIS — D649 Anemia, unspecified: Secondary | ICD-10-CM

## 2013-02-19 LAB — CBC WITH DIFFERENTIAL/PLATELET
Eosinophils Relative: 3.4 % (ref 0.0–5.0)
Lymphocytes Relative: 31.3 % (ref 12.0–46.0)
MCV: 91.4 fl (ref 78.0–100.0)
Monocytes Absolute: 0.6 10*3/uL (ref 0.1–1.0)
Monocytes Relative: 8.9 % (ref 3.0–12.0)
Neutrophils Relative %: 55.5 % (ref 43.0–77.0)
Platelets: 131 10*3/uL — ABNORMAL LOW (ref 150.0–400.0)
WBC: 6.9 10*3/uL (ref 4.5–10.5)

## 2013-02-20 NOTE — Progress Notes (Signed)
Quick Note:  CBC is ok except for slightly low platelets which he has had before - this is not clinically significant. Please let him know He should have a CBC in 6 months while on sulfasalazine. ______

## 2013-02-21 ENCOUNTER — Other Ambulatory Visit: Payer: Self-pay

## 2013-02-21 DIAGNOSIS — K5150 Left sided colitis without complications: Secondary | ICD-10-CM

## 2013-03-06 ENCOUNTER — Other Ambulatory Visit: Payer: Self-pay

## 2013-03-06 MED ORDER — FUROSEMIDE 40 MG PO TABS: 40.0000 mg | ORAL_TABLET | Freq: Every day | ORAL | Status: DC

## 2013-03-07 ENCOUNTER — Other Ambulatory Visit: Payer: Self-pay

## 2013-03-07 MED ORDER — SULFASALAZINE 500 MG PO TABS: 500.0000 mg | ORAL_TABLET | Freq: Two times a day (BID) | ORAL | Status: DC

## 2013-03-07 NOTE — Telephone Encounter (Signed)
According to January 2014 office visit refill sent in for Sulfasalazine 500mg  , one twice a day.  January note said may refill thru January 2015.

## 2013-03-12 ENCOUNTER — Ambulatory Visit (INDEPENDENT_AMBULATORY_CARE_PROVIDER_SITE_OTHER): Payer: Medicare Other | Admitting: *Deleted

## 2013-03-12 DIAGNOSIS — I4892 Unspecified atrial flutter: Secondary | ICD-10-CM | POA: Diagnosis not present

## 2013-03-12 DIAGNOSIS — Z8679 Personal history of other diseases of the circulatory system: Secondary | ICD-10-CM | POA: Diagnosis not present

## 2013-03-12 DIAGNOSIS — Z7901 Long term (current) use of anticoagulants: Secondary | ICD-10-CM

## 2013-03-12 LAB — POCT INR: INR: 3

## 2013-03-20 ENCOUNTER — Ambulatory Visit: Payer: Medicare Other | Admitting: Pulmonary Disease

## 2013-04-22 ENCOUNTER — Ambulatory Visit (INDEPENDENT_AMBULATORY_CARE_PROVIDER_SITE_OTHER): Payer: Medicare Other | Admitting: *Deleted

## 2013-04-22 ENCOUNTER — Encounter: Payer: Self-pay | Admitting: Cardiology

## 2013-04-22 ENCOUNTER — Ambulatory Visit (INDEPENDENT_AMBULATORY_CARE_PROVIDER_SITE_OTHER): Payer: Medicare Other | Admitting: Cardiology

## 2013-04-22 VITALS — BP 126/70 | HR 85 | Wt 198.0 lb

## 2013-04-22 DIAGNOSIS — J441 Chronic obstructive pulmonary disease with (acute) exacerbation: Secondary | ICD-10-CM

## 2013-04-22 DIAGNOSIS — Z9889 Other specified postprocedural states: Secondary | ICD-10-CM

## 2013-04-22 DIAGNOSIS — I2510 Atherosclerotic heart disease of native coronary artery without angina pectoris: Secondary | ICD-10-CM | POA: Diagnosis not present

## 2013-04-22 DIAGNOSIS — Z8679 Personal history of other diseases of the circulatory system: Secondary | ICD-10-CM

## 2013-04-22 DIAGNOSIS — I5030 Unspecified diastolic (congestive) heart failure: Secondary | ICD-10-CM

## 2013-04-22 DIAGNOSIS — Z7901 Long term (current) use of anticoagulants: Secondary | ICD-10-CM | POA: Diagnosis not present

## 2013-04-22 DIAGNOSIS — I4891 Unspecified atrial fibrillation: Secondary | ICD-10-CM | POA: Diagnosis not present

## 2013-04-22 DIAGNOSIS — I4892 Unspecified atrial flutter: Secondary | ICD-10-CM | POA: Diagnosis not present

## 2013-04-22 DIAGNOSIS — I509 Heart failure, unspecified: Secondary | ICD-10-CM

## 2013-04-22 DIAGNOSIS — I10 Essential (primary) hypertension: Secondary | ICD-10-CM

## 2013-04-22 DIAGNOSIS — E7800 Pure hypercholesterolemia, unspecified: Secondary | ICD-10-CM | POA: Diagnosis not present

## 2013-04-22 LAB — BASIC METABOLIC PANEL
Calcium: 9.5 mg/dL (ref 8.4–10.5)
Chloride: 104 mEq/L (ref 96–112)
Creatinine, Ser: 1.3 mg/dL (ref 0.4–1.5)
Sodium: 140 mEq/L (ref 135–145)

## 2013-04-22 LAB — POCT INR: INR: 2.4

## 2013-04-22 MED ORDER — PRAVASTATIN SODIUM 40 MG PO TABS: 40.0000 mg | ORAL_TABLET | Freq: Every evening | ORAL | Status: DC

## 2013-04-22 MED ORDER — DILTIAZEM HCL ER COATED BEADS 240 MG PO CP24: 240.0000 mg | ORAL_CAPSULE | Freq: Every day | ORAL | Status: DC

## 2013-04-22 MED ORDER — DILTIAZEM HCL ER COATED BEADS 120 MG PO CP24: 120.0000 mg | ORAL_CAPSULE | Freq: Every day | ORAL | Status: DC

## 2013-04-22 MED ORDER — FUROSEMIDE 40 MG PO TABS: 40.0000 mg | ORAL_TABLET | Freq: Every day | ORAL | Status: DC

## 2013-04-22 MED ORDER — WARFARIN SODIUM 5 MG PO TABS: 5.0000 mg | ORAL_TABLET | ORAL | Status: DC

## 2013-04-22 NOTE — Assessment & Plan Note (Signed)
Patient remains in atrial fibrillation.continue Cardizem for rate control. Continue Coumadin with goal INR 2-3.

## 2013-04-22 NOTE — Assessment & Plan Note (Signed)
History a mitral valve repair. Continue SBE prophylaxis. Note he takes Lasix occasionally. Check potassium and renal function.

## 2013-04-22 NOTE — Assessment & Plan Note (Signed)
Continue statin. 

## 2013-04-22 NOTE — Assessment & Plan Note (Signed)
Continue statin. Not on aspirin given need for Coumadin. 

## 2013-04-22 NOTE — Assessment & Plan Note (Signed)
Blood pressure controlled. Continue present medications. 

## 2013-04-22 NOTE — Progress Notes (Signed)
HPI: Pleasant male for fu of atrial fibrillation. He also has a hx of MV repair, s/p MAZE procedure, nonobstructive CAD, prior embolic CVA after initial MV repair secondary to endocarditis requiring redo surgery (repair). LHC 12/06: Mid RCA 40-50%. Echo 11/13: EF 55%, trivial AI, mitral valve ring prosthesis present with mild stenosis (mean gradient 7 mm of mercury), moderate LAE, mild to moderate RVE, moderately reduced RVSF, moderate RAE, PASP 46. Patient seen by [name] in April of 2014 and placed on diuretics for heart failure and also seen by pulmonary and treated for COPD. Since he was last seen, he continues to have dyspnea on exertion which is chronic and unchanged. No orthopnea or PND. Occasional mild pedal edema improved with Lasix. No chest pain, palpitations, syncope or bleeding.   Current Outpatient Prescriptions  Medication Sig Dispense Refill  . diltiazem (CARDIZEM CD) 120 MG 24 hr capsule Take 1 capsule (120 mg total) by mouth daily.  90 capsule  3  . diltiazem (CARDIZEM CD) 240 MG 24 hr capsule Take 1 capsule (240 mg total) by mouth daily.  90 capsule  3  . Fluticasone Furoate-Vilanterol (BREO ELLIPTA) 100-25 MCG/INH AEPB Inhale 1 puff into the lungs daily.  1 each  6  . furosemide (LASIX) 40 MG tablet Take 1 tablet (40 mg total) by mouth daily.  90 tablet  1  . pravastatin (PRAVACHOL) 40 MG tablet Take 1 tablet (40 mg total) by mouth every evening.  30 tablet  12  . sulfaSALAzine (AZULFIDINE) 500 MG tablet Take 1 tablet (500 mg total) by mouth 2 (two) times daily.  180 tablet  2  . warfarin (COUMADIN) 5 MG tablet TAKE ONE TABLET BY MOUTH EVERY DAY AS DIRECTED FOR  90  DAYS  120 tablet  1   No current facility-administered medications for this visit.     Past Medical History  Diagnosis Date  . Mediastinal lymphadenopathy   . History of prostate cancer   . Atrial fibrillation   . COPD (chronic obstructive pulmonary disease)   . CAD (coronary artery disease)   .  Adrenal adenoma   . Warthin's tumor   . Dyslipidemia   . Diverticulosis   . Embolic stroke     d/t endocarditis 2008  . Atrial flutter   . Ulcerative colitis     left-sided, associated with diverticulosis  . Obstructive chronic bronchitis   . HTN (hypertension)   . Thrombocytopenia   . Emphysema   . Right thyroid nodule   . Hemorrhoids   . TIA (transient ischemic attack) 03/25/2011    ? of due to transient diplopia    Past Surgical History  Procedure Laterality Date  . Mitral valve repair  09/22/04    and modified Cox-Maze IV   . Mitral valve repair  09/14/05    redo MV repair d/t endocardiits/embolic events  . Prostatectomy    . Inguinal hernia repair      right  . Cataract extraction, bilateral    . Salivary gland surgery      left resection  . Flexible sigmoidoscopy  01/11/2008; 03/19/2010    2009 and 2011:segmental left colitis, diverticulosis, hemorrhoids  . Colonoscopy w/ biopsies  12/19/2007    left-sided colitis, diverticulosis, hemorrhoids    History   Social History  . Marital Status: Married    Spouse Name: N/A    Number of Children: N/A  . Years of Education: N/A   Occupational History  . RETIRED     [employer]  Social History Main Topics  . Smoking status: Former Smoker -- 3.00 packs/day for 60 years    Types: Cigarettes    Quit date: 09/19/2002  . Smokeless tobacco: Never Used  . Alcohol Use: No  . Drug Use: No  . Sexually Active: Not on file   Other Topics Concern  . Not on file   Social History Narrative   Pt has children    ROS: no fevers or chills, productive cough, hemoptysis, dysphasia, odynophagia, melena, hematochezia, dysuria, hematuria, rash, seizure activity, orthopnea, PND, pedal edema, claudication. Remaining systems are negative.  Physical Exam: Well-developed well-nourished in no acute distress.  Skin is warm and dry.  HEENT is normal.  Neck is supple.  Chest is clear to auscultation with normal expansion.  Cardiovascular exam  is irregular Abdominal exam nontender or distended. No masses palpated. Extremities show trace edema. neuro grossly intact  ECG atrial fibrillation at a rate of 85. Occasional PVCs or aberrantly conducted beats. No ST changes. Right axis deviation.

## 2013-04-22 NOTE — Patient Instructions (Addendum)
Your physician wants you to follow-up in: 6 MONTHS WITH DR CRENSHAW You will receive a reminder letter in the mail two months in advance. If you don't receive a letter, please call our office to schedule the follow-up appointment.   Your physician recommends that you HAVE LAB WORK TODAY 

## 2013-04-29 ENCOUNTER — Ambulatory Visit (INDEPENDENT_AMBULATORY_CARE_PROVIDER_SITE_OTHER): Payer: Medicare Other | Admitting: Pulmonary Disease

## 2013-04-29 ENCOUNTER — Encounter: Payer: Self-pay | Admitting: Pulmonary Disease

## 2013-04-29 VITALS — BP 120/64 | HR 72 | Temp 97.4°F | Ht 68.0 in | Wt 200.8 lb

## 2013-04-29 DIAGNOSIS — J4489 Other specified chronic obstructive pulmonary disease: Secondary | ICD-10-CM

## 2013-04-29 DIAGNOSIS — J449 Chronic obstructive pulmonary disease, unspecified: Secondary | ICD-10-CM

## 2013-04-29 MED ORDER — ALBUTEROL SULFATE HFA 108 (90 BASE) MCG/ACT IN AERS: 2.0000 | INHALATION_SPRAY | Freq: Four times a day (QID) | RESPIRATORY_TRACT | Status: DC | PRN

## 2013-04-29 NOTE — Progress Notes (Signed)
  Subjective:    Patient ID: [name], male    DOB: Nov 02, 1935, 77 y.o.   MRN: [id number]  HPI The patient comes in today for followup of his known COPD and multifactorial dyspnea.  He was tried on breo at the last visit, but saw no improvement in his dyspnea on exertion.  He has been tried on multiple medications for COPD, with no improvement.  He would like to avoid maintenance medications that they are not going to help his breathing on a day-to-day basis.  A lot of his shortness of breath is related to his weight, deconditioning, and underlying heart disease.   Review of Systems  Constitutional: Negative for fever and unexpected weight change.  HENT: Negative for ear pain, nosebleeds, congestion, sore throat, rhinorrhea, sneezing, trouble swallowing, dental problem, postnasal drip and sinus pressure.   Eyes: Negative for redness and itching.  Respiratory: Positive for shortness of breath. Negative for cough, chest tightness and wheezing.   Cardiovascular: Negative for palpitations and leg swelling.  Gastrointestinal: Negative for nausea and vomiting.  Genitourinary: Negative for dysuria.  Musculoskeletal: Negative for joint swelling.  Skin: Negative for rash.  Neurological: Negative for headaches.  Hematological: Does not bruise/bleed easily.  Psychiatric/Behavioral: Negative for dysphoric mood. The patient is not nervous/anxious.        Objective:   Physical Exam Obese male in no acute distress Nose without purulence or discharge noted Neck without lymphadenopathy or thyromegaly Chest with mild basilar crackles, adequate air flow, no wheezing Cardiac exam with regular rate and rhythm Lower extremities with 1+ edema, no cyanosis Alert and oriented, moves all 4 extremities.       Assessment & Plan:

## 2013-04-29 NOTE — Addendum Note (Signed)
Addended by: Maisie Fus on: 04/29/2013 11:40 AM   Modules accepted: Orders

## 2013-04-29 NOTE — Assessment & Plan Note (Signed)
The patient feels that he has had no change in his breathing with all the different medications that we have tried.  Since he is not having frequent exacerbations, I will eat him on albuterol as needed alone.  I have also encouraged him to stay on his oxygen at night while sleeping, and to work on weight loss and some type of conditioning program.  I have already offered to send him to the pulmonary rehabilitation program at the hospital, which he respectfully declined.

## 2013-04-29 NOTE — Patient Instructions (Addendum)
Stay on a rescue inhaler to use if you get into trouble.  Use albuterol, 2 puffs up to every 6 hrs if needed. Work on weight reduction and some type of exercise. Continue wearing oxygen with sleep. followup with me in 6mos.

## 2013-06-03 ENCOUNTER — Ambulatory Visit (INDEPENDENT_AMBULATORY_CARE_PROVIDER_SITE_OTHER): Payer: Medicare Other | Admitting: *Deleted

## 2013-06-03 DIAGNOSIS — Z8679 Personal history of other diseases of the circulatory system: Secondary | ICD-10-CM | POA: Diagnosis not present

## 2013-06-03 DIAGNOSIS — Z7901 Long term (current) use of anticoagulants: Secondary | ICD-10-CM | POA: Diagnosis not present

## 2013-06-03 DIAGNOSIS — I4892 Unspecified atrial flutter: Secondary | ICD-10-CM | POA: Diagnosis not present

## 2013-06-03 LAB — POCT INR: INR: 3.6

## 2013-06-24 ENCOUNTER — Ambulatory Visit (INDEPENDENT_AMBULATORY_CARE_PROVIDER_SITE_OTHER): Payer: Medicare Other | Admitting: Pharmacist

## 2013-06-24 DIAGNOSIS — Z8679 Personal history of other diseases of the circulatory system: Secondary | ICD-10-CM

## 2013-06-24 DIAGNOSIS — Z7901 Long term (current) use of anticoagulants: Secondary | ICD-10-CM | POA: Diagnosis not present

## 2013-06-24 DIAGNOSIS — I4892 Unspecified atrial flutter: Secondary | ICD-10-CM

## 2013-06-24 LAB — POCT INR: INR: 2.8

## 2013-06-25 ENCOUNTER — Ambulatory Visit (INDEPENDENT_AMBULATORY_CARE_PROVIDER_SITE_OTHER): Payer: Medicare Other | Admitting: Pulmonary Disease

## 2013-06-25 ENCOUNTER — Encounter: Payer: Self-pay | Admitting: Pulmonary Disease

## 2013-06-25 VITALS — BP 132/72 | HR 49 | Temp 97.6°F | Ht 67.5 in | Wt 203.4 lb

## 2013-06-25 DIAGNOSIS — J439 Emphysema, unspecified: Secondary | ICD-10-CM

## 2013-06-25 DIAGNOSIS — J438 Other emphysema: Secondary | ICD-10-CM | POA: Diagnosis not present

## 2013-06-25 NOTE — Progress Notes (Signed)
  Subjective:    Patient ID: [name], male    DOB: [birth year], 77 y.o.   MRN: [id number]  HPI The patient comes in today for followup of his known COPD.  We have been unable to find a maintenance regimen that makes any difference in his symptomatology, but he comes in today with worsening shortness of breath over the last few months.  He has been using his rescue inhaler with limited success.  He does have a history of atrial fibrillation, and remains on Coumadin.  He is wearing his oxygen at night while sleeping.  He has not had a pulmonary infection or acute exacerbation since the last visit.  His weight is stable since his last visit as well.   Review of Systems  Constitutional: Negative for fever and unexpected weight change.  HENT: Negative for ear pain, nosebleeds, congestion, sore throat, rhinorrhea, sneezing, trouble swallowing, dental problem, postnasal drip and sinus pressure.   Eyes: Negative for redness and itching.  Respiratory: Positive for shortness of breath. Negative for cough, chest tightness and wheezing.   Cardiovascular: Negative for palpitations and leg swelling.  Gastrointestinal: Negative for nausea and vomiting.  Genitourinary: Negative for dysuria.  Musculoskeletal: Negative for joint swelling.  Skin: Negative for rash.  Neurological: Negative for headaches.  Hematological: Does not bruise/bleed easily.  Psychiatric/Behavioral: Negative for dysphoric mood. The patient is not nervous/anxious.        Objective:   Physical Exam Obese male in no acute distress Nose without purulence or discharge, but he does have mild epistaxis currently. Oropharynx clear Neck without lymphadenopathy or thyromegaly Chest with decreased breath sounds but no active wheezing or rhonchi.  There are no crackles Cardiac exam with irregular rhythm, and a rate of approximately 110-120. Lower extremities with minimal edema, no cyanosis Alert and oriented, moves all 4  extremities.       Assessment & Plan:

## 2013-06-25 NOTE — Assessment & Plan Note (Signed)
The patient has had some increasing shortness of breath over the last few months, and it is unclear how much of this is related to his underlying obstructive lung disease, and how much may be related to his atrial arrhythmia with increased heart rate.  We have never been able to get him on a good maintenance regimen for his COPD, since the patient feels these medications have not helped him.  I would like to try him on Anoro, which is new.  If he does not see improvement in his breathing, and he continues to have an elevated heart rate, he is to contact his cardiologist.

## 2013-06-25 NOTE — Patient Instructions (Addendum)
Will add a bubble bottle to your oxygen to help with dryness. Will send in a prescription for your albuterol Trial of anoro one inhalation each am everyday.  Please call me in 4 weeks to give me an update with how things are going. Work on weight loss and conditioning. Please monitor your heart rate, and if getting excessive, you need to call your cardiologist. Keep prior scheduled apptm with me.

## 2013-06-27 ENCOUNTER — Ambulatory Visit (INDEPENDENT_AMBULATORY_CARE_PROVIDER_SITE_OTHER): Payer: Medicare Other

## 2013-06-27 ENCOUNTER — Telehealth: Payer: Self-pay

## 2013-06-27 DIAGNOSIS — Z23 Encounter for immunization: Secondary | ICD-10-CM | POA: Diagnosis not present

## 2013-06-27 NOTE — Telephone Encounter (Signed)
FYI/Patient states to the office for a flu vaccine. CMA concerned because he had a nose bleed and is taking coumadin.  Spoke with patient and he states that a nose bleed is not uncommon. He has had his whole life. States that it bleeds easier since being on coumadin. Patient INR was checked 10/6 and it was 2.8.  Has not ever told cardio that he is having nose bleeds when they check his INR.  Advised that he should let them know.

## 2013-08-06 ENCOUNTER — Ambulatory Visit (INDEPENDENT_AMBULATORY_CARE_PROVIDER_SITE_OTHER): Payer: Medicare Other | Admitting: *Deleted

## 2013-08-06 DIAGNOSIS — Z7901 Long term (current) use of anticoagulants: Secondary | ICD-10-CM | POA: Diagnosis not present

## 2013-08-06 DIAGNOSIS — I4892 Unspecified atrial flutter: Secondary | ICD-10-CM

## 2013-08-06 DIAGNOSIS — Z8679 Personal history of other diseases of the circulatory system: Secondary | ICD-10-CM

## 2013-08-23 ENCOUNTER — Telehealth: Payer: Self-pay

## 2013-08-23 ENCOUNTER — Telehealth: Payer: Self-pay | Admitting: Pulmonary Disease

## 2013-08-23 MED ORDER — ALBUTEROL SULFATE HFA 108 (90 BASE) MCG/ACT IN AERS: 2.0000 | INHALATION_SPRAY | Freq: Four times a day (QID) | RESPIRATORY_TRACT | Status: DC | PRN

## 2013-08-23 NOTE — Telephone Encounter (Signed)
Patient advised he is due for a follow up CBC.  He states he will come one day next week

## 2013-08-23 NOTE — Telephone Encounter (Signed)
I called and made spouse aware rx has been sent. Nothing further needed

## 2013-08-23 NOTE — Telephone Encounter (Signed)
Message copied by Annett Fabian on Fri Aug 23, 2013  1:55 PM ------      Message from: Annett Fabian      Created: Thu Feb 21, 2013  3:53 PM       Needs labs see labs 02/21/13 ------

## 2013-08-28 ENCOUNTER — Other Ambulatory Visit (INDEPENDENT_AMBULATORY_CARE_PROVIDER_SITE_OTHER): Payer: Medicare Other

## 2013-08-28 DIAGNOSIS — K5150 Left sided colitis without complications: Secondary | ICD-10-CM | POA: Diagnosis not present

## 2013-08-28 DIAGNOSIS — C61 Malignant neoplasm of prostate: Secondary | ICD-10-CM | POA: Diagnosis not present

## 2013-08-28 DIAGNOSIS — N529 Male erectile dysfunction, unspecified: Secondary | ICD-10-CM | POA: Diagnosis not present

## 2013-08-28 DIAGNOSIS — K429 Umbilical hernia without obstruction or gangrene: Secondary | ICD-10-CM | POA: Diagnosis not present

## 2013-08-28 DIAGNOSIS — R39198 Other difficulties with micturition: Secondary | ICD-10-CM | POA: Diagnosis not present

## 2013-08-28 LAB — CBC WITH DIFFERENTIAL/PLATELET
Basophils Absolute: 0.1 10*3/uL (ref 0.0–0.1)
Basophils Relative: 0.9 % (ref 0.0–3.0)
Eosinophils Absolute: 0.2 10*3/uL (ref 0.0–0.7)
Lymphocytes Relative: 38 % (ref 12.0–46.0)
MCHC: 33.1 g/dL (ref 30.0–36.0)
MCV: 92 fl (ref 78.0–100.0)
Monocytes Absolute: 0.9 10*3/uL (ref 0.1–1.0)
Neutrophils Relative %: 48.1 % (ref 43.0–77.0)
Platelets: 173 10*3/uL (ref 150.0–400.0)
RBC: 4.75 Mil/uL (ref 4.22–5.81)

## 2013-09-02 NOTE — Progress Notes (Signed)
Quick Note:  CBC is ok Repeat again in 6 months ______

## 2013-09-03 ENCOUNTER — Other Ambulatory Visit: Payer: Self-pay

## 2013-09-03 DIAGNOSIS — D500 Iron deficiency anemia secondary to blood loss (chronic): Secondary | ICD-10-CM

## 2013-09-04 ENCOUNTER — Telehealth: Payer: Self-pay | Admitting: Pulmonary Disease

## 2013-09-04 NOTE — Telephone Encounter (Signed)
ATC pt line busy wcb 

## 2013-09-05 NOTE — Telephone Encounter (Signed)
I called spouse and made aware of recs. Nothing further needed

## 2013-09-05 NOTE — Telephone Encounter (Signed)
I would like to avoid a nebulizer machine at this point if we can.  anoro has a long acting albuterol in it already, and if the nebs are used a lot, will be an overdose.  Let's see how he does for now, and can re-address if it becomes more of an issue.

## 2013-09-05 NOTE — Telephone Encounter (Signed)
I spoke with the pt spouse and she wants to know if the pt can have a portable nebulizer machine with albuterol to use. She states he has an albuterol inhaler and is using Anoro but they feel that the neb machine will help him more. Please advise. Carron Curie, CMA No Known Allergies

## 2013-09-05 NOTE — Telephone Encounter (Signed)
lmomtcb  

## 2013-09-05 NOTE — Telephone Encounter (Signed)
Pt's spouse returned call.  Holly D Pryor ° °

## 2013-09-08 ENCOUNTER — Emergency Department (HOSPITAL_BASED_OUTPATIENT_CLINIC_OR_DEPARTMENT_OTHER): Payer: Medicare Other

## 2013-09-08 ENCOUNTER — Emergency Department (HOSPITAL_BASED_OUTPATIENT_CLINIC_OR_DEPARTMENT_OTHER)
Admission: EM | Admit: 2013-09-08 | Discharge: 2013-09-09 | Disposition: A | Discharge status: Home / Self Care | Payer: Medicare Other | Attending: Emergency Medicine | Admitting: Emergency Medicine

## 2013-09-08 ENCOUNTER — Encounter: Payer: Self-pay | Admitting: Internal Medicine

## 2013-09-08 ENCOUNTER — Encounter (HOSPITAL_BASED_OUTPATIENT_CLINIC_OR_DEPARTMENT_OTHER): Payer: Self-pay | Admitting: Emergency Medicine

## 2013-09-08 DIAGNOSIS — I509 Heart failure, unspecified: Secondary | ICD-10-CM | POA: Diagnosis not present

## 2013-09-08 DIAGNOSIS — J441 Chronic obstructive pulmonary disease with (acute) exacerbation: Secondary | ICD-10-CM | POA: Diagnosis not present

## 2013-09-08 DIAGNOSIS — Z8673 Personal history of transient ischemic attack (TIA), and cerebral infarction without residual deficits: Secondary | ICD-10-CM | POA: Diagnosis not present

## 2013-09-08 DIAGNOSIS — Z91199 Patient's noncompliance with other medical treatment and regimen due to unspecified reason: Secondary | ICD-10-CM | POA: Insufficient documentation

## 2013-09-08 DIAGNOSIS — E785 Hyperlipidemia, unspecified: Secondary | ICD-10-CM | POA: Diagnosis not present

## 2013-09-08 DIAGNOSIS — Z87891 Personal history of nicotine dependence: Secondary | ICD-10-CM | POA: Diagnosis not present

## 2013-09-08 DIAGNOSIS — Z9119 Patient's noncompliance with other medical treatment and regimen: Secondary | ICD-10-CM | POA: Insufficient documentation

## 2013-09-08 DIAGNOSIS — Z8719 Personal history of other diseases of the digestive system: Secondary | ICD-10-CM | POA: Insufficient documentation

## 2013-09-08 DIAGNOSIS — I4891 Unspecified atrial fibrillation: Secondary | ICD-10-CM | POA: Insufficient documentation

## 2013-09-08 DIAGNOSIS — Z8546 Personal history of malignant neoplasm of prostate: Secondary | ICD-10-CM | POA: Insufficient documentation

## 2013-09-08 DIAGNOSIS — R42 Dizziness and giddiness: Secondary | ICD-10-CM | POA: Diagnosis not present

## 2013-09-08 DIAGNOSIS — Z79899 Other long term (current) drug therapy: Secondary | ICD-10-CM | POA: Diagnosis not present

## 2013-09-08 DIAGNOSIS — I10 Essential (primary) hypertension: Secondary | ICD-10-CM | POA: Insufficient documentation

## 2013-09-08 DIAGNOSIS — Z7901 Long term (current) use of anticoagulants: Secondary | ICD-10-CM | POA: Diagnosis not present

## 2013-09-08 DIAGNOSIS — Z862 Personal history of diseases of the blood and blood-forming organs and certain disorders involving the immune mechanism: Secondary | ICD-10-CM | POA: Diagnosis not present

## 2013-09-08 DIAGNOSIS — I4892 Unspecified atrial flutter: Secondary | ICD-10-CM | POA: Diagnosis not present

## 2013-09-08 DIAGNOSIS — Z8639 Personal history of other endocrine, nutritional and metabolic disease: Secondary | ICD-10-CM | POA: Insufficient documentation

## 2013-09-08 DIAGNOSIS — Z9114 Patient's other noncompliance with medication regimen: Secondary | ICD-10-CM

## 2013-09-08 DIAGNOSIS — I2510 Atherosclerotic heart disease of native coronary artery without angina pectoris: Secondary | ICD-10-CM | POA: Diagnosis not present

## 2013-09-08 LAB — CBC WITH DIFFERENTIAL/PLATELET
Basophils Absolute: 0 10*3/uL (ref 0.0–0.1)
Basophils Relative: 1 % (ref 0–1)
Eosinophils Absolute: 0.3 10*3/uL (ref 0.0–0.7)
Eosinophils Relative: 3 % (ref 0–5)
Lymphocytes Relative: 43 % (ref 12–46)
MCH: 30.5 pg (ref 26.0–34.0)
MCHC: 33.1 g/dL (ref 30.0–36.0)
MCV: 92.3 fL (ref 78.0–100.0)
Neutrophils Relative %: 42 % — ABNORMAL LOW (ref 43–77)
Platelets: 126 10*3/uL — ABNORMAL LOW (ref 150–400)
RDW: 13.9 % (ref 11.5–15.5)
WBC: 8.4 10*3/uL (ref 4.0–10.5)

## 2013-09-08 MED ORDER — IPRATROPIUM BROMIDE 0.02 % IN SOLN
0.5000 mg | Freq: Once | RESPIRATORY_TRACT | Status: AC
  Administered 2013-09-08: 0.5 mg via RESPIRATORY_TRACT
  Filled 2013-09-08: qty 2.5

## 2013-09-08 MED ORDER — ALBUTEROL SULFATE (5 MG/ML) 0.5% IN NEBU
5.0000 mg | INHALATION_SOLUTION | Freq: Once | RESPIRATORY_TRACT | Status: AC
  Administered 2013-09-08: 5 mg via RESPIRATORY_TRACT
  Filled 2013-09-08: qty 1

## 2013-09-08 NOTE — ED Notes (Signed)
Cough x 2 weeks with worsening SOB. Pt denies fever or chills.

## 2013-09-08 NOTE — ED Provider Notes (Signed)
CSN: [id number]     Arrival date & time 09/08/13  2213 History   First MD Initiated Contact with Patient 09/08/13 2252      This chart was scribed for Hanley Seamen, MD by Arlan Organ, ED Scribe. This patient was seen in room MH02/MH02 and the patient's care was started 11:28 PM.   Chief Complaint  Patient presents with  . Shortness of Breath   HPI  HPI Comments: [name] is a 77 y.o. male with a h/o CHF, A-Fib, CAD, and COPD who presents to the Emergency Department complaining of gradual onset, gradually worsening SOB that initially started a couple of weeks ago, but has recently worsened. Relative states "he woke up this morning full of fluid and very SOB". Pt states lying down and walking worsens the discomfort, and sitting up alleviates his SOB. The shortness of breath and severe enough that he can barely walk several steps without having to sit back down. She also reports intermittent short periods of blurred vision, confusion, and dizziness that she is concerned may be represent a stroke, as he has had previous strokes.. She says he uses oxygen at night, and does currently have an inhaler to use at home. He has not taken his Lasix in 3 weeks further admits that he has not taken his Cardizem. Denies CP, fever, chills, nausea, or vomiting.   PCP Dr. Marga Melnick Past Medical History  Diagnosis Date  . Mediastinal lymphadenopathy   . History of prostate cancer   . Atrial fibrillation   . COPD (chronic obstructive pulmonary disease)   . CAD (coronary artery disease)   . Adrenal adenoma   . Warthin's tumor   . Dyslipidemia   . Diverticulosis   . Embolic stroke     d/t endocarditis 2008  . Atrial flutter   . Ulcerative colitis     left-sided, associated with diverticulosis  . Obstructive chronic bronchitis   . HTN (hypertension)   . Thrombocytopenia   . Emphysema   . Right thyroid nodule   . Hemorrhoids   . TIA (transient ischemic attack) 03/25/2011    ? of due to  transient diplopia   Past Surgical History  Procedure Laterality Date  . Mitral valve repair  09/22/04    and modified Cox-Maze IV   . Mitral valve repair  09/14/05    redo MV repair d/t endocardiits/embolic events  . Prostatectomy    . Inguinal hernia repair      right  . Cataract extraction, bilateral    . Salivary gland surgery      left resection  . Flexible sigmoidoscopy  01/11/2008; 03/19/2010    2009 and 2011:segmental left colitis, diverticulosis, hemorrhoids  . Colonoscopy w/ biopsies  12/19/2007    left-sided colitis, diverticulosis, hemorrhoids   Family History  Problem Relation Age of Onset  . Stroke Father   . Heart disease Father   . Cancer Mother     spinal  . Colon cancer Neg Hx    History  Substance Use Topics  . Smoking status: Former Smoker -- 3.00 packs/day for 60 years    Types: Cigarettes    Quit date: 09/19/2002  . Smokeless tobacco: Never Used  . Alcohol Use: No    Review of Systems  All other systems reviewed and are negative.    Allergies  Review of patient's allergies indicates no known allergies.  Home Medications   Current Outpatient Rx  Name  Route  Sig  Dispense  Refill  .  albuterol (PROVENTIL HFA;VENTOLIN HFA) 108 (90 BASE) MCG/ACT inhaler   Inhalation   Inhale 2 puffs into the lungs every 6 (six) hours as needed for wheezing or shortness of breath.   1 Inhaler   6   . diltiazem (CARDIZEM CD) 120 MG 24 hr capsule   Oral   Take 1 capsule (120 mg total) by mouth daily.   90 capsule   3   . diltiazem (CARDIZEM CD) 240 MG 24 hr capsule   Oral   Take 1 capsule (240 mg total) by mouth daily.   90 capsule   3   . furosemide (LASIX) 40 MG tablet   Oral   Take 40 mg by mouth daily as needed.         . pravastatin (PRAVACHOL) 40 MG tablet   Oral   Take 1 tablet (40 mg total) by mouth every evening.   90 tablet   3   . sulfaSALAzine (AZULFIDINE) 500 MG tablet   Oral   Take 1 tablet (500 mg total) by mouth 2 (two) times  daily.   180 tablet   2   . warfarin (COUMADIN) 5 MG tablet   Oral   Take 1 tablet (5 mg total) by mouth as directed.   120 tablet   1     120 tabs is 90 day supply    Triage Vitals: BP 153/94  Pulse 64  Temp(Src) 97.8 F (36.6 C) (Oral)  Resp 18  Wt 207 lb (93.895 kg)  SpO2 96%  Physical Exam  General: Well-developed, well-nourished male in no acute distress; appearance consistent with age of record HENT: normocephalic; atraumatic Eyes: pupils equal, round and reactive to light; extraocular muscles intact Neck: supple Heart: no murmurs, Tachycardic; irregular rhythm Lungs: clear to auscultation bilaterally; Rales in left base Abdomen: soft; nondistended; nontender; no masses or hepatosplenomegaly; bowel sounds present Extremities: No deformity; full range of motion; pulses normal; Trace edema in lower extremities Neurologic: Awake, alert and oriented; motor function intact in all extremities and symmetric; no facial droop Skin: Warm and dry Psychiatric: Normal mood and affect   ED Course  Procedures (including critical care time)  DIAGNOSTIC STUDIES: Oxygen Saturation is 96% on RA, adequate by my interpretation.    COORDINATION OF CARE: 11:41 PM- Will give breathing treatment. Will order blood panel. Discussed treatment plan with pt at bedside and pt agreed to plan.    MDM   Nursing notes and vitals signs, including pulse oximetry, reviewed.  Summary of this visit's results, reviewed by myself:  Labs:  Results for orders placed during the hospital encounter of 09/08/13 (from the past 24 hour(s))  CBC WITH DIFFERENTIAL     Status: Abnormal   Collection Time    09/08/13 11:40 PM      Result Value Range   WBC 8.4  4.0 - 10.5 K/uL   RBC 4.42  4.22 - 5.81 MIL/uL   Hemoglobin 13.5  13.0 - 17.0 g/dL   HCT 16.1  09.6 - 04.5 %   MCV 92.3  78.0 - 100.0 fL   MCH 30.5  26.0 - 34.0 pg   MCHC 33.1  30.0 - 36.0 g/dL   RDW 40.9  81.1 - 91.4 %   Platelets 126 (*) 150 -  400 K/uL   Neutrophils Relative % 42 (*) 43 - 77 %   Neutro Abs 3.5  1.7 - 7.7 K/uL   Lymphocytes Relative 43  12 - 46 %   Lymphs Abs 3.6  0.7 - 4.0 K/uL   Monocytes Relative 11  3 - 12 %   Monocytes Absolute 0.9  0.1 - 1.0 K/uL   Eosinophils Relative 3  0 - 5 %   Eosinophils Absolute 0.3  0.0 - 0.7 K/uL   Basophils Relative 1  0 - 1 %   Basophils Absolute 0.0  0.0 - 0.1 K/uL  TROPONIN I     Status: None   Collection Time    09/08/13 11:40 PM      Result Value Range   Troponin I <0.30  <0.30 ng/mL  PRO B NATRIURETIC PEPTIDE     Status: Abnormal   Collection Time    09/08/13 11:40 PM      Result Value Range   Pro B Natriuretic peptide (BNP) 1027.0 (*) 0 - 450 pg/mL  PROTIME-INR     Status: Abnormal   Collection Time    09/08/13 11:40 PM      Result Value Range   Prothrombin Time 30.4 (*) 11.6 - 15.2 seconds   INR 3.04 (*) 0.00 - 1.49  URINALYSIS, ROUTINE W REFLEX MICROSCOPIC     Status: None   Collection Time    09/09/13  1:35 AM      Result Value Range   Color, Urine YELLOW  YELLOW   APPearance CLEAR  CLEAR   Specific Gravity, Urine 1.014  1.005 - 1.030   pH 6.5  5.0 - 8.0   Glucose, UA NEGATIVE  NEGATIVE mg/dL   Hgb urine dipstick NEGATIVE  NEGATIVE   Bilirubin Urine NEGATIVE  NEGATIVE   Ketones, ur NEGATIVE  NEGATIVE mg/dL   Protein, ur NEGATIVE  NEGATIVE mg/dL   Urobilinogen, UA 0.2  0.0 - 1.0 mg/dL   Nitrite NEGATIVE  NEGATIVE   Leukocytes, UA NEGATIVE  NEGATIVE    Imaging Studies: Dg Chest 2 View  09/09/2013   CLINICAL DATA:  Short of breath.  EXAM: CHEST  2 VIEW  COMPARISON:  12/18/2012.  FINDINGS: Mitral annuloplasty and median sternotomy. Cephalization of pulmonary blood flow is present with interstitial pulmonary edema and mild CHF. Basilar atelectasis. There may be tiny bilateral pleural effusions with blunting of the costophrenic angles. Aortic arch atherosclerosis.  IMPRESSION: Mild CHF.   Electronically Signed   By: Andreas Newport M.D.   On: 09/09/2013  00:08   Ct Head Wo Contrast  09/09/2013   CLINICAL DATA:  Short of breath for 2 weeks. Blurry vision. Confusion and dizziness.  EXAM: CT HEAD WITHOUT CONTRAST  TECHNIQUE: Contiguous axial images were obtained from the base of the skull through the vertex without intravenous contrast.  COMPARISON:  01/21/2012.  FINDINGS: No mass lesion, mass effect, midline shift, hydrocephalus, hemorrhage. No acute territorial cortical ischemia/infarct. Atrophy and chronic ischemic white matter disease is present. Intracranial atherosclerosis. The calvarium appears within normal limits. Mastoid air cells clear.  IMPRESSION: Atrophy and chronic ischemic white matter disease. No acute intracranial abnormality.   Electronically Signed   By: Andreas Newport M.D.   On: 09/09/2013 00:09   EKG Interpretation    Date/Time:  Monday September 09 2013 00:06:59 EST Ventricular Rate:  108 PR Interval:    QRS Duration: 108 QT Interval:  356 QTC Calculation: 477 R Axis:   -176 Text Interpretation:  Atrial fibrillation with rapid ventricular response Right superior axis deviation Right ventricular hypertrophy Abnormal ECG Rate is faster; PVCs no longer present Confirmed by Dreonna Hussein  MD, Jamilynn Whitacre ([id number]) on 09/09/2013 12:22:03 AM     3:13 AM Brisk diuresis  after Lasix 40 mg IV. Tachycardia improved after IV Cardizem. Patient now able to ambulate around the ED without having to stop. His family states he appears to be back to his baseline. His respiratory rate is in the low 20s; his oxygen saturation is 90-92% on room air. He was advised of the importance of taking his medications as prescribed. He was also advised to contact his PCP later today.   I personally performed the services described in this documentation, which was scribed in my presence.  The recorded information has been reviewed and is accurate.  Hanley Seamen, MD 09/09/13 [phone]

## 2013-09-09 ENCOUNTER — Telehealth: Payer: Self-pay | Admitting: *Deleted

## 2013-09-09 DIAGNOSIS — I509 Heart failure, unspecified: Secondary | ICD-10-CM | POA: Diagnosis not present

## 2013-09-09 LAB — URINALYSIS, ROUTINE W REFLEX MICROSCOPIC
Bilirubin Urine: NEGATIVE
Hgb urine dipstick: NEGATIVE
Ketones, ur: NEGATIVE mg/dL
Nitrite: NEGATIVE
Protein, ur: NEGATIVE mg/dL
Urobilinogen, UA: 0.2 mg/dL (ref 0.0–1.0)

## 2013-09-09 LAB — PROTIME-INR: Prothrombin Time: 30.4 seconds — ABNORMAL HIGH (ref 11.6–15.2)

## 2013-09-09 LAB — TROPONIN I: Troponin I: <0.3 ng/mL (ref <0.30)

## 2013-09-09 LAB — PRO B NATRIURETIC PEPTIDE: Pro B Natriuretic peptide (BNP): 1027 pg/mL — ABNORMAL HIGH (ref 0–450)

## 2013-09-09 MED ORDER — DILTIAZEM HCL 25 MG/5ML IV SOLN
20.0000 mg | Freq: Once | INTRAVENOUS | Status: AC
  Administered 2013-09-09: 20 mg via INTRAVENOUS
  Filled 2013-09-09: qty 5

## 2013-09-09 MED ORDER — FUROSEMIDE 10 MG/ML IJ SOLN
40.0000 mg | Freq: Once | INTRAMUSCULAR | Status: AC
  Administered 2013-09-09: 40 mg via INTRAVENOUS
  Filled 2013-09-09: qty 4

## 2013-09-09 NOTE — ED Notes (Signed)
MD at bedside. 

## 2013-09-09 NOTE — Telephone Encounter (Signed)
Patient wife called and stated that he was seen at the hospital this weekend for congested heart failuer. The on call doctor there wanted him to follow up with dr hopper today. I offer him an apt for today because dr hopper is full today.Please advise.

## 2013-09-09 NOTE — ED Notes (Signed)
Ambulated patient with saturation probe at room air. Patient able to speak in complete sentences although was SOB towards end of walk around unit. Patient oxygen saturations maintained around 89-91% with heart rate 120-131. Patient stated he would not have been able to have done this much walking at home without being SOB. Physician notified of results. Patient tolerated well.

## 2013-09-09 NOTE — Telephone Encounter (Signed)
Spoke with pt and made follow up appt for 09/10/13 at 10:00

## 2013-09-10 ENCOUNTER — Ambulatory Visit (INDEPENDENT_AMBULATORY_CARE_PROVIDER_SITE_OTHER): Payer: Medicare Other | Admitting: Internal Medicine

## 2013-09-10 ENCOUNTER — Encounter: Payer: Self-pay | Admitting: Internal Medicine

## 2013-09-10 VITALS — BP 148/77 | HR 93 | Temp 97.9°F | Wt 200.4 lb

## 2013-09-10 DIAGNOSIS — I4891 Unspecified atrial fibrillation: Secondary | ICD-10-CM

## 2013-09-10 DIAGNOSIS — I509 Heart failure, unspecified: Secondary | ICD-10-CM

## 2013-09-10 DIAGNOSIS — R040 Epistaxis: Secondary | ICD-10-CM

## 2013-09-10 DIAGNOSIS — Z7901 Long term (current) use of anticoagulants: Secondary | ICD-10-CM | POA: Diagnosis not present

## 2013-09-10 NOTE — Progress Notes (Signed)
   Subjective:    Patient ID: [name], male    DOB: 1935/10/08, 77 y.o.   MRN: [id number]  HPI   He was seen in the emergency room on 09/08/13 with acute congestive heart failure in the context of atrial fibrillation with rapid ventricular response.BNP was 1027; troponin normal  He had exertional dyspnea after walking 15 feet. His O2 sats was documented to be 86% on room air.  He has been taking his furosemide 40 mg infrequently, "1 every 2-3 weeks".  He received parenteral diuretics in the emergency room with significant improvement and a weight loss of 7 pounds. Hospitalization was recommended but he declined. He does have supervision by a trained medical person at home.    Review of Systems  Sever headache after driving into [place] last week; this resolved with 2 Tylenol.He denies chest pain, palpitations,claudication,or  paroxysmal nocturnal dyspnea (on O2 overnight) . His  edema  has improved dramatically.  He's had epistaxis right nostril almost daily for several months. His PT/INR was 3.08 in the emergency room 12/21. The prior value was 2.8. He is due for repeat PT/INR 12/30.    Objective:   Physical Exam Appears healthy and well-nourished & in no acute distress  The nasal septum is bulging and deviated to the left. There is fresh blood along the right nasal septum. No carotid bruits are present.No neck pain distention present at 10 - 15 degrees. Thyroid substernal, unable to  palpate  Heart rhythm and rate are irregular with no significant murmurs or gallops.  Very low grade rhonchi diffuselywith no increased work of breathing  There is no evidence of aortic aneurysm or renal artery bruits  Abdomen soft with no organomegaly or masses. No HJR  No clubbing or cyanosis . Trace edema present.  Pedal pulses are intact  But decreased DPP  No ischemic skin changes are present . Nails healthy    Alert and oriented. Strength, tone normal          Assessment & Plan:   #1 congestive heart failure; clinically improved  #2 medication noncompliance; risk discussed  #3 atrial fibrillation with slight tachycardia  #4 epistaxis in context of warfarin  Plan: See recommendations

## 2013-09-10 NOTE — Progress Notes (Signed)
Pre visit review using our clinic review tool, if applicable. No additional management support is needed unless otherwise documented below in the visit note. 

## 2013-09-10 NOTE — Patient Instructions (Addendum)
Your ideal BP goal = AVERAGE < 135/85.Minimal goal is average < 140/90. Avoid ingestion of  excess salt/sodium.Cook with pepper & other spices . Use the salt substitute "No Salt" OR the Mrs Sharilyn Sites products to season food @ the table. Avoid foods which taste salty or "vinegary" as their sodium content will be high. Please weigh on the same scales every Monday; report if 5 pounds or more. Please do not stop Furosemide unless instructed to do so by a doctor. Please review the medication list in the After Visit Summary provided.Please verify the medication name (this may be  brand or generic) & correct dosage. Write the name of the prescribing physician to the right of the medication and share this with all medical staff seen at each appointment. This will help provide continuity of care; help optimize therapeutic interventions;and help prevent drug:drug adverse reaction.  Take 5 mg of Coumadin today. Change to schedule to 5 mg Monday, Wednesday, Friday, and Sunday. Take 7.5 mg Tuesday, Thursday, and Saturday.

## 2013-09-17 ENCOUNTER — Ambulatory Visit (INDEPENDENT_AMBULATORY_CARE_PROVIDER_SITE_OTHER): Payer: Medicare Other | Admitting: *Deleted

## 2013-09-17 DIAGNOSIS — Z7901 Long term (current) use of anticoagulants: Secondary | ICD-10-CM

## 2013-09-17 DIAGNOSIS — J440 Chronic obstructive pulmonary disease with acute lower respiratory infection: Secondary | ICD-10-CM | POA: Diagnosis not present

## 2013-09-17 DIAGNOSIS — I4892 Unspecified atrial flutter: Secondary | ICD-10-CM | POA: Diagnosis not present

## 2013-09-17 DIAGNOSIS — Z8679 Personal history of other diseases of the circulatory system: Secondary | ICD-10-CM | POA: Diagnosis not present

## 2013-10-08 ENCOUNTER — Ambulatory Visit (INDEPENDENT_AMBULATORY_CARE_PROVIDER_SITE_OTHER): Payer: Medicare Other | Admitting: *Deleted

## 2013-10-08 DIAGNOSIS — I4892 Unspecified atrial flutter: Secondary | ICD-10-CM

## 2013-10-08 DIAGNOSIS — Z8679 Personal history of other diseases of the circulatory system: Secondary | ICD-10-CM

## 2013-10-08 DIAGNOSIS — R040 Epistaxis: Secondary | ICD-10-CM | POA: Diagnosis not present

## 2013-10-08 DIAGNOSIS — Z7901 Long term (current) use of anticoagulants: Secondary | ICD-10-CM | POA: Diagnosis not present

## 2013-10-08 LAB — POCT INR: INR: 2.4

## 2013-10-16 DIAGNOSIS — R040 Epistaxis: Secondary | ICD-10-CM | POA: Diagnosis not present

## 2013-10-21 ENCOUNTER — Encounter: Payer: Medicare Other | Admitting: Cardiology

## 2013-10-21 NOTE — Progress Notes (Signed)
HPI: FU atrial fibrillation. He also has a hx of MV repair, s/p MAZE procedure, nonobstructive CAD, prior embolic CVA after initial MV repair secondary to endocarditis requiring redo surgery (repair). LHC 12/06: Mid RCA 40-50%. Echo 11/13: EF 55%, trivial AI, mitral valve ring prosthesis present with mild stenosis (mean gradient 7 mm of mercury), moderate LAE, mild to moderate RVE, moderately reduced RVSF, moderate RAE, PASP 46. Last seen August 2014. Seen for mild congestive heart failure in the emergency room in December. Since then,    Current Outpatient Prescriptions  Medication Sig Dispense Refill  . albuterol (PROVENTIL HFA;VENTOLIN HFA) 108 (90 BASE) MCG/ACT inhaler Inhale 2 puffs into the lungs every 6 (six) hours as needed for wheezing or shortness of breath.  1 Inhaler  6  . diltiazem (CARDIZEM CD) 120 MG 24 hr capsule Take 1 capsule (120 mg total) by mouth daily.  90 capsule  3  . diltiazem (CARDIZEM CD) 240 MG 24 hr capsule Take 1 capsule (240 mg total) by mouth daily.  90 capsule  3  . furosemide (LASIX) 40 MG tablet Take 40 mg by mouth daily as needed.      . pravastatin (PRAVACHOL) 40 MG tablet Take 1 tablet (40 mg total) by mouth every evening.  90 tablet  3  . sulfaSALAzine (AZULFIDINE) 500 MG tablet Take 1 tablet (500 mg total) by mouth 2 (two) times daily.  180 tablet  2  . warfarin (COUMADIN) 5 MG tablet Take 1 tablet (5 mg total) by mouth as directed.  120 tablet  1   No current facility-administered medications for this visit.     Past Medical History  Diagnosis Date  . Mediastinal lymphadenopathy   . History of prostate cancer   . Atrial fibrillation   . COPD (chronic obstructive pulmonary disease)   . CAD (coronary artery disease)   . Adrenal adenoma   . Warthin's tumor   . Dyslipidemia   . Diverticulosis   . Embolic stroke     d/t endocarditis 2008  . Atrial flutter   . Ulcerative colitis     left-sided, associated with diverticulosis  . Obstructive  chronic bronchitis   . HTN (hypertension)   . Thrombocytopenia   . Emphysema   . Right thyroid nodule   . Hemorrhoids   . TIA (transient ischemic attack) 03/25/2011    ? of due to transient diplopia    Past Surgical History  Procedure Laterality Date  . Mitral valve repair  09/22/04    and modified Cox-Maze IV   . Mitral valve repair  09/14/05    redo MV repair d/t endocardiits/embolic events  . Prostatectomy    . Inguinal hernia repair      right  . Cataract extraction, bilateral    . Salivary gland surgery      left resection  . Flexible sigmoidoscopy  01/11/2008; 03/19/2010    2009 and 2011:segmental left colitis, diverticulosis, hemorrhoids  . Colonoscopy w/ biopsies  12/19/2007    left-sided colitis, diverticulosis, hemorrhoids    History   Social History  . Marital Status: Married    Spouse Name: N/A    Number of Children: N/A  . Years of Education: N/A   Occupational History  . RETIRED     [employer]    Social History Main Topics  . Smoking status: Former Smoker -- 3.00 packs/day for 60 years    Types: Cigarettes    Quit date: 09/19/2002  . Smokeless tobacco: Never  Used  . Alcohol Use: No  . Drug Use: No  . Sexual Activity: Not on file   Other Topics Concern  . Not on file   Social History Narrative   Pt has children    ROS: no fevers or chills, productive cough, hemoptysis, dysphasia, odynophagia, melena, hematochezia, dysuria, hematuria, rash, seizure activity, orthopnea, PND, pedal edema, claudication. Remaining systems are negative.  Physical Exam: Well-developed well-nourished in no acute distress.  Skin is warm and dry.  HEENT is normal.  Neck is supple.  Chest is clear to auscultation with normal expansion.  Cardiovascular exam is regular rate and rhythm.  Abdominal exam nontender or distended. No masses palpated. Extremities show no edema. neuro grossly intact  ECG     This encounter was created in error - please disregard.

## 2013-10-28 ENCOUNTER — Encounter: Payer: Self-pay | Admitting: Pulmonary Disease

## 2013-10-28 ENCOUNTER — Ambulatory Visit (INDEPENDENT_AMBULATORY_CARE_PROVIDER_SITE_OTHER): Payer: Medicare Other | Admitting: Pulmonary Disease

## 2013-10-28 VITALS — BP 108/68 | HR 78 | Temp 97.9°F | Ht 68.0 in | Wt 198.4 lb

## 2013-10-28 DIAGNOSIS — J438 Other emphysema: Secondary | ICD-10-CM

## 2013-10-28 DIAGNOSIS — J439 Emphysema, unspecified: Secondary | ICD-10-CM

## 2013-10-28 NOTE — Progress Notes (Signed)
   Subjective:    Patient ID: [name], male    DOB: Sep 27, 1935, 78 y.o.   MRN: [id number]  HPI The patient comes in today for followup of his known moderate COPD. We have tried him on many different maintenance bronchodilators with no significant change in his day-to-day breathing. Most recently, I gave him samples of anoro back in October to try, but the patient never called to give [place] an update with his response. He ran out of this, and has been on a sample of breo for 2 weeks that he had leftover.  He did have an episode of congestive heart failure the end of December when he was noncompliant with his diuretics, but feels that he is doing better. He feels that his dyspnea on exertion is at his usual baseline.   Review of Systems  Constitutional: Negative for fever and unexpected weight change.  HENT: Negative for congestion, dental problem, ear pain, nosebleeds, postnasal drip, rhinorrhea, sinus pressure, sneezing, sore throat and trouble swallowing.   Eyes: Negative for redness and itching.  Respiratory: Negative for cough, chest tightness, shortness of breath and wheezing.   Cardiovascular: Negative for palpitations and leg swelling.  Gastrointestinal: Negative for nausea and vomiting.  Genitourinary: Negative for dysuria.  Musculoskeletal: Negative for joint swelling.  Skin: Negative for rash.  Neurological: Negative for headaches.  Hematological: Does not bruise/bleed easily.  Psychiatric/Behavioral: Negative for dysphoric mood. The patient is not nervous/anxious.        Objective:   Physical Exam Overweight male in no acute distress Nose without purulence or discharge noted Neck without lymphadenopathy or thyromegaly Chest with decreased breath sounds, but no wheezes or rhonchi Cardiac exam with slightly irregular rhythm but controlled ventricular response Lower extremities without significant edema, no cyanosis Alert and oriented, moves all 4 extremities.        Assessment & Plan:

## 2013-10-28 NOTE — Patient Instructions (Signed)
Stop breo Start back on anoro, one inhalation each am.  Rinse mouth well.  You need to call me in 4 weeks with how things are going so we can decide whether to stay on this med. Can stay on ventolin (dark blue) for rescue only if needed. Work on weight loss and conditioning. Stay on your heart medications under the guidance of cardiology If doing well, followup with me again in 20mos

## 2013-10-28 NOTE — Assessment & Plan Note (Signed)
The patient overall appears to be stable from a COPD standpoint.  He has not had a recent acute exacerbation or pulmonary infection. We have tried him on many different types of maintenance bronchodilators without a significant response, however he cannot remember if he benefited from the trial of anoro that I gave him last fall. I will give him samples again for one month, and he is to call me with his response to therapy so that we can decide if he is going to maintain on this regimen.

## 2013-11-21 ENCOUNTER — Encounter: Payer: Self-pay | Admitting: Cardiology

## 2013-11-21 ENCOUNTER — Ambulatory Visit (INDEPENDENT_AMBULATORY_CARE_PROVIDER_SITE_OTHER): Payer: Medicare Other | Admitting: Cardiology

## 2013-11-21 ENCOUNTER — Ambulatory Visit (INDEPENDENT_AMBULATORY_CARE_PROVIDER_SITE_OTHER): Payer: Medicare Other | Admitting: Pharmacist

## 2013-11-21 VITALS — BP 130/68 | HR 74 | Ht 68.0 in | Wt 198.0 lb

## 2013-11-21 DIAGNOSIS — E7800 Pure hypercholesterolemia, unspecified: Secondary | ICD-10-CM | POA: Diagnosis not present

## 2013-11-21 DIAGNOSIS — Z8679 Personal history of other diseases of the circulatory system: Secondary | ICD-10-CM | POA: Diagnosis not present

## 2013-11-21 DIAGNOSIS — I10 Essential (primary) hypertension: Secondary | ICD-10-CM

## 2013-11-21 DIAGNOSIS — Z9889 Other specified postprocedural states: Secondary | ICD-10-CM

## 2013-11-21 DIAGNOSIS — I4892 Unspecified atrial flutter: Secondary | ICD-10-CM | POA: Diagnosis not present

## 2013-11-21 DIAGNOSIS — R0602 Shortness of breath: Secondary | ICD-10-CM

## 2013-11-21 DIAGNOSIS — I2510 Atherosclerotic heart disease of native coronary artery without angina pectoris: Secondary | ICD-10-CM

## 2013-11-21 DIAGNOSIS — I4891 Unspecified atrial fibrillation: Secondary | ICD-10-CM

## 2013-11-21 DIAGNOSIS — Z7901 Long term (current) use of anticoagulants: Secondary | ICD-10-CM

## 2013-11-21 DIAGNOSIS — I5030 Unspecified diastolic (congestive) heart failure: Secondary | ICD-10-CM

## 2013-11-21 DIAGNOSIS — I509 Heart failure, unspecified: Secondary | ICD-10-CM

## 2013-11-21 DIAGNOSIS — I059 Rheumatic mitral valve disease, unspecified: Secondary | ICD-10-CM | POA: Diagnosis not present

## 2013-11-21 DIAGNOSIS — J441 Chronic obstructive pulmonary disease with (acute) exacerbation: Secondary | ICD-10-CM

## 2013-11-21 LAB — POCT INR: INR: 3.1

## 2013-11-21 MED ORDER — DILTIAZEM HCL ER COATED BEADS 360 MG PO CP24: 360.0000 mg | ORAL_CAPSULE | Freq: Every day | ORAL | Status: DC

## 2013-11-21 MED ORDER — PRAVASTATIN SODIUM 40 MG PO TABS: 40.0000 mg | ORAL_TABLET | Freq: Every evening | ORAL | Status: DC

## 2013-11-21 MED ORDER — FUROSEMIDE 40 MG PO TABS: 40.0000 mg | ORAL_TABLET | Freq: Every day | ORAL | Status: DC | PRN

## 2013-11-21 MED ORDER — WARFARIN SODIUM 5 MG PO TABS: 5.0000 mg | ORAL_TABLET | ORAL | Status: DC

## 2013-11-21 NOTE — Assessment & Plan Note (Signed)
Continue statin. Not on aspirin given need for Coumadin. 

## 2013-11-21 NOTE — Addendum Note (Signed)
Addended by: Cristopher Estimable on: 11/21/2013 04:48 PM   Modules accepted: Orders, Medications

## 2013-11-21 NOTE — Assessment & Plan Note (Signed)
Continue Cardizem and Coumadin.

## 2013-11-21 NOTE — Patient Instructions (Signed)
Your physician wants you to follow-up in: [place] will receive a reminder letter in the mail two months in advance. If you don't receive a letter, please call our office to schedule the follow-up appointment.   Your physician recommends that you return for lab work WITH ECHO  Your physician has requested that you have an echocardiogram. Echocardiography is a painless test that uses sound waves to create images of your heart. It provides your doctor with information about the size and shape of your heart and how well your heart's chambers and valves are working. This procedure takes approximately one hour. There are no restrictions for this procedure.

## 2013-11-21 NOTE — Assessment & Plan Note (Signed)
History a mitral valve repair. Repeat echocardiogram. Continue SBE prophylaxis.

## 2013-11-21 NOTE — Assessment & Plan Note (Signed)
Blood pressure controlled. Continue present medications. 

## 2013-11-21 NOTE — Progress Notes (Signed)
HPI: FU atrial fibrillation and MV repair. He also has a hx of MAZE procedure, nonobstructive CAD, prior embolic CVA after initial MV repair secondary to endocarditis requiring redo surgery (repair). LHC 12/06: Mid RCA 40-50%. Echo 11/13: EF 55%, trivial AI, mitral valve ring prosthesis present with mild stenosis (mean gradient 7 mm of mercury), moderate LAE, mild to moderate RVE, moderately reduced RVSF, moderate RAE, PASP 46. Previously placed on diuretics for heart failure and also seen by pulmonary and treated for COPD. Since he was last seen in in August of 2014, he continues to have dyspnea on exertion. No orthopnea, PND, chest pain, palpitations, syncope or bleeding. He did have pedal edema recently but states he was not taking his Lasix. This has resolved with resumption of medications.  Current Outpatient Prescriptions  Medication Sig Dispense Refill  . albuterol (PROVENTIL HFA;VENTOLIN HFA) 108 (90 BASE) MCG/ACT inhaler Inhale 2 puffs into the lungs every 6 (six) hours as needed for wheezing or shortness of breath.  1 Inhaler  6  . BREO ELLIPTA 100-25 MCG/INH AEPB       . diltiazem (CARDIZEM CD) 120 MG 24 hr capsule Take 1 capsule (120 mg total) by mouth daily.  90 capsule  3  . diltiazem (CARDIZEM CD) 240 MG 24 hr capsule Take 1 capsule (240 mg total) by mouth daily.  90 capsule  3  . furosemide (LASIX) 40 MG tablet Take 40 mg by mouth daily as needed.      . pravastatin (PRAVACHOL) 40 MG tablet Take 1 tablet (40 mg total) by mouth every evening.  90 tablet  3  . sulfaSALAzine (AZULFIDINE) 500 MG tablet Take 1 tablet (500 mg total) by mouth 2 (two) times daily.  180 tablet  2  . warfarin (COUMADIN) 5 MG tablet Take 1 tablet (5 mg total) by mouth as directed.  120 tablet  1   No current facility-administered medications for this visit.     Past Medical History  Diagnosis Date  . Mediastinal lymphadenopathy   . History of prostate cancer   . Atrial fibrillation   . COPD  (chronic obstructive pulmonary disease)   . CAD (coronary artery disease)   . Adrenal adenoma   . Warthin's tumor   . Dyslipidemia   . Diverticulosis   . Embolic stroke     d/t endocarditis 2008  . Atrial flutter   . Ulcerative colitis     left-sided, associated with diverticulosis  . Obstructive chronic bronchitis   . HTN (hypertension)   . Thrombocytopenia   . Emphysema   . Right thyroid nodule   . Hemorrhoids   . TIA (transient ischemic attack) 03/25/2011    ? of due to transient diplopia    Past Surgical History  Procedure Laterality Date  . Mitral valve repair  09/22/04    and modified Cox-Maze IV   . Mitral valve repair  09/14/05    redo MV repair d/t endocardiits/embolic events  . Prostatectomy    . Inguinal hernia repair      right  . Cataract extraction, bilateral    . Salivary gland surgery      left resection  . Flexible sigmoidoscopy  01/11/2008; 03/19/2010    2009 and 2011:segmental left colitis, diverticulosis, hemorrhoids  . Colonoscopy w/ biopsies  12/19/2007    left-sided colitis, diverticulosis, hemorrhoids    History   Social History  . Marital Status: Married    Spouse Name: N/A    Number of  Children: N/A  . Years of Education: N/A   Occupational History  . RETIRED     [employer]    Social History Main Topics  . Smoking status: Former Smoker -- 3.00 packs/day for 60 years    Types: Cigarettes    Quit date: 09/19/2002  . Smokeless tobacco: Never Used  . Alcohol Use: No  . Drug Use: No  . Sexual Activity: Not on file   Other Topics Concern  . Not on file   Social History Narrative   Pt has children    ROS: no fevers or chills, productive cough, hemoptysis, dysphasia, odynophagia, melena, hematochezia, dysuria, hematuria, rash, seizure activity, orthopnea, PND, claudication. Remaining systems are negative.  Physical Exam: Well-developed well-nourished in no acute distress.  Skin is warm and dry.  HEENT is normal.  Neck is supple.  Chest is  clear to auscultation with normal expansion.  Cardiovascular exam is irregular Abdominal exam nontender or distended. No masses palpated. Extremities show no edema. neuro grossly intact  ECG 09/09/2013-atrial fibrillation. RV conduction delay.

## 2013-11-21 NOTE — Assessment & Plan Note (Signed)
Continue statin. Check lipids and liver. 

## 2013-11-26 DIAGNOSIS — R040 Epistaxis: Secondary | ICD-10-CM | POA: Diagnosis not present

## 2013-12-02 ENCOUNTER — Telehealth: Payer: Self-pay | Admitting: Pulmonary Disease

## 2013-12-02 MED ORDER — ALBUTEROL SULFATE HFA 108 (90 BASE) MCG/ACT IN AERS: 2.0000 | INHALATION_SPRAY | Freq: Four times a day (QID) | RESPIRATORY_TRACT | Status: DC | PRN

## 2013-12-02 MED ORDER — UMECLIDINIUM-VILANTEROL 62.5-25 MCG/INH IN AEPB: 1.0000 | INHALATION_SPRAY | Freq: Every day | RESPIRATORY_TRACT | Status: DC

## 2013-12-02 NOTE — Telephone Encounter (Signed)
Both prescriptions have been sent to [facility]. Attempted to call pt, no answer.

## 2013-12-04 MED ORDER — UMECLIDINIUM-VILANTEROL 62.5-25 MCG/INH IN AEPB: 1.0000 | INHALATION_SPRAY | Freq: Every day | RESPIRATORY_TRACT | Status: DC

## 2013-12-04 MED ORDER — ALBUTEROL SULFATE HFA 108 (90 BASE) MCG/ACT IN AERS: 2.0000 | INHALATION_SPRAY | Freq: Four times a day (QID) | RESPIRATORY_TRACT | Status: DC | PRN

## 2013-12-04 NOTE — Addendum Note (Signed)
Addended by: Virl Cagey on: 12/04/2013 02:09 PM   Modules accepted: Orders

## 2013-12-09 ENCOUNTER — Ambulatory Visit (HOSPITAL_COMMUNITY): Payer: Medicare Other | Attending: Cardiology | Admitting: Radiology

## 2013-12-09 ENCOUNTER — Other Ambulatory Visit: Payer: Self-pay | Admitting: *Deleted

## 2013-12-09 ENCOUNTER — Other Ambulatory Visit (INDEPENDENT_AMBULATORY_CARE_PROVIDER_SITE_OTHER): Payer: Medicare Other

## 2013-12-09 ENCOUNTER — Encounter: Payer: Self-pay | Admitting: Cardiology

## 2013-12-09 DIAGNOSIS — I10 Essential (primary) hypertension: Secondary | ICD-10-CM | POA: Diagnosis not present

## 2013-12-09 DIAGNOSIS — I059 Rheumatic mitral valve disease, unspecified: Secondary | ICD-10-CM | POA: Diagnosis not present

## 2013-12-09 DIAGNOSIS — I2510 Atherosclerotic heart disease of native coronary artery without angina pectoris: Secondary | ICD-10-CM | POA: Diagnosis not present

## 2013-12-09 DIAGNOSIS — I4891 Unspecified atrial fibrillation: Secondary | ICD-10-CM | POA: Diagnosis not present

## 2013-12-09 DIAGNOSIS — E7800 Pure hypercholesterolemia, unspecified: Secondary | ICD-10-CM

## 2013-12-09 DIAGNOSIS — R0602 Shortness of breath: Secondary | ICD-10-CM

## 2013-12-09 LAB — BASIC METABOLIC PANEL
BUN: 15 mg/dL (ref 6–23)
CALCIUM: 9.4 mg/dL (ref 8.4–10.5)
CO2: 26 mEq/L (ref 19–32)
CREATININE: 1.3 mg/dL (ref 0.4–1.5)
Chloride: 105 mEq/L (ref 96–112)
GFR: 59.14 mL/min — AB (ref >60.00)
GLUCOSE: 91 mg/dL (ref 70–99)
POTASSIUM: 3.7 meq/L (ref 3.5–5.1)
Sodium: 138 mEq/L (ref 135–145)

## 2013-12-09 LAB — LIPID PANEL
CHOL/HDL RATIO: 4
Cholesterol: 124 mg/dL (ref 0–200)
HDL: 33.5 mg/dL — AB (ref >39.00)
LDL Cholesterol: 72 mg/dL (ref 0–99)
Triglycerides: 95 mg/dL (ref 0.0–149.0)
VLDL: 19 mg/dL (ref 0.0–40.0)

## 2013-12-09 LAB — HEPATIC FUNCTION PANEL
ALT: 18 U/L (ref 0–53)
AST: 22 U/L (ref 0–37)
Albumin: 3.9 g/dL (ref 3.5–5.2)
Alkaline Phosphatase: 69 U/L (ref 39–117)
BILIRUBIN DIRECT: 0.2 mg/dL (ref 0.0–0.3)
Total Bilirubin: 0.6 mg/dL (ref 0.3–1.2)
Total Protein: 7 g/dL (ref 6.0–8.3)

## 2013-12-09 LAB — BRAIN NATRIURETIC PEPTIDE: Pro B Natriuretic peptide (BNP): 83 pg/mL (ref 0.0–100.0)

## 2013-12-09 MED ORDER — DILTIAZEM HCL ER COATED BEADS 240 MG PO CP24: 240.0000 mg | ORAL_CAPSULE | Freq: Every day | ORAL | Status: DC

## 2013-12-09 MED ORDER — DILTIAZEM HCL ER COATED BEADS 120 MG PO CP24: 120.0000 mg | ORAL_CAPSULE | Freq: Every day | ORAL | Status: DC

## 2013-12-09 NOTE — Progress Notes (Signed)
Echocardiogram performed.  

## 2013-12-17 DIAGNOSIS — L821 Other seborrheic keratosis: Secondary | ICD-10-CM | POA: Diagnosis not present

## 2013-12-17 DIAGNOSIS — D485 Neoplasm of uncertain behavior of skin: Secondary | ICD-10-CM | POA: Diagnosis not present

## 2013-12-20 ENCOUNTER — Other Ambulatory Visit: Payer: Self-pay | Admitting: Internal Medicine

## 2013-12-23 ENCOUNTER — Ambulatory Visit (INDEPENDENT_AMBULATORY_CARE_PROVIDER_SITE_OTHER): Payer: Medicare Other

## 2013-12-23 DIAGNOSIS — Z7901 Long term (current) use of anticoagulants: Secondary | ICD-10-CM | POA: Diagnosis not present

## 2013-12-23 DIAGNOSIS — I4892 Unspecified atrial flutter: Secondary | ICD-10-CM

## 2013-12-23 DIAGNOSIS — Z8679 Personal history of other diseases of the circulatory system: Secondary | ICD-10-CM

## 2013-12-23 LAB — POCT INR: INR: 3.3

## 2013-12-30 ENCOUNTER — Ambulatory Visit (INDEPENDENT_AMBULATORY_CARE_PROVIDER_SITE_OTHER): Payer: Medicare Other | Admitting: Pulmonary Disease

## 2013-12-30 ENCOUNTER — Encounter: Payer: Self-pay | Admitting: Pulmonary Disease

## 2013-12-30 ENCOUNTER — Ambulatory Visit (INDEPENDENT_AMBULATORY_CARE_PROVIDER_SITE_OTHER)
Admission: RE | Admit: 2013-12-30 | Discharge: 2013-12-30 | Disposition: A | Discharge status: Home / Self Care | Payer: Medicare Other | Source: Ambulatory Visit | Attending: Pulmonary Disease | Admitting: Pulmonary Disease

## 2013-12-30 VITALS — BP 108/62 | HR 95 | Temp 99.6°F | Ht 68.5 in | Wt 200.6 lb

## 2013-12-30 DIAGNOSIS — J439 Emphysema, unspecified: Secondary | ICD-10-CM

## 2013-12-30 DIAGNOSIS — J438 Other emphysema: Secondary | ICD-10-CM

## 2013-12-30 DIAGNOSIS — I2510 Atherosclerotic heart disease of native coronary artery without angina pectoris: Secondary | ICD-10-CM | POA: Diagnosis not present

## 2013-12-30 DIAGNOSIS — J441 Chronic obstructive pulmonary disease with (acute) exacerbation: Secondary | ICD-10-CM

## 2013-12-30 DIAGNOSIS — R0989 Other specified symptoms and signs involving the circulatory and respiratory systems: Secondary | ICD-10-CM | POA: Diagnosis not present

## 2013-12-30 MED ORDER — PREDNISONE 10 MG PO TABS: ORAL_TABLET | ORAL | Status: DC

## 2013-12-30 MED ORDER — LEVOFLOXACIN 750 MG PO TABS: 750.0000 mg | ORAL_TABLET | Freq: Every day | ORAL | Status: DC

## 2013-12-30 NOTE — Progress Notes (Signed)
   Subjective:    Patient ID: [name], male    DOB: 08-Oct-1935, 78 y.o.   MRN: [id number]  HPI The patient comes in today as an acute sick visit. He has known moderate COPD, and as well as chronic congestive heart failure and atrial fibrillation. The patient gives a one to two-month history of increasing shortness of breath, and is also had recurrent epistaxis from being on Coumadin. His INR at the last check was mildly elevated, and the patient has seen otolaryngology for cauterization. He has been bringing up mucus that was blood-tinged, and I explained this was from his nosebleeds. His shortness of breath got acutely worse the end of last week, and he started getting more congested in his chest with discolored mucus at times. He denies any chest pain, either pressure or pleuritic in nature. He is continuing to have epistaxis intermittently. He has not had worsening lower extremity edema, and although his weight is up a few pounds, it is not significantly elevated.  He has had an elevated heart rate most recently, but has been using a nebulizer machine with medicine that he borrowed from someone else.   Review of Systems  Constitutional: Negative for fever and unexpected weight change.  HENT: Positive for congestion, nosebleeds and rhinorrhea. Negative for dental problem, ear pain, postnasal drip, sinus pressure, sneezing, sore throat and trouble swallowing.   Eyes: Negative for redness and itching.  Respiratory: Positive for cough and shortness of breath. Negative for chest tightness and wheezing.        Hemoptysis x 2-3 days  Cardiovascular: Negative for palpitations and leg swelling.  Gastrointestinal: Negative for nausea and vomiting.  Genitourinary: Negative for dysuria.  Musculoskeletal: Negative for joint swelling.  Skin: Negative for rash.  Neurological: Negative for headaches.  Hematological: Does not bruise/bleed easily.  Psychiatric/Behavioral: Negative for dysphoric mood. The  patient is not nervous/anxious.        Objective:   Physical Exam Overweight male in no acute distress Nose without purulence or discharge noted. Heme staining noted. Oropharynx clear Neck without lymphadenopathy or thyromegaly Chest with crackles in both bases, no active wheezing. Cardiac exam with irregular rhythm and an apical rate of approximately 110 beats per minute. Lower extremities with minimal edema, no cyanosis Alert and oriented, moves all 4 extremities.       Assessment & Plan:

## 2013-12-30 NOTE — Assessment & Plan Note (Signed)
The patient relates worsening shortness of breath above his usual baseline for the last month or so, but feels that it has gotten worse since the end of last week. He is coughing up blood-tinged mucus, but is having a significant nosebleed that has been an ongoing problem for the last 2 months and has been addressed by otolaryngology. His INR was elevated at the last check, and I've encouraged him to get back with his ENT physician since this is continuing. However, he is also having chest congestion and feels that he is having a chest cold. His mucus is getting thicker, and he thinks it is purulent at times. It is unclear whether his worsening dyspnea on exertion is secondary to this or his congestive heart failure. The patient has crackles in both bases on exam, and his apical rate is approximately 110 currently with irregularity. He also is having exertional desaturations. At this point, I would like to treat him for an acute bronchitis with a COPD exacerbation, and also give him on exertional oxygen. If he continues to have issues, I suspect this is more related to cardiac issues than his pulmonary problems. He is to call me if he is not improving. We'll also check a chest x-ray today for completeness.

## 2013-12-30 NOTE — Addendum Note (Signed)
Addended by: Virl Cagey on: 12/30/2013 12:24 PM   Modules accepted: Orders

## 2013-12-30 NOTE — Patient Instructions (Signed)
Will start on oxygen with exertion.  Continue oxygen with sleep.  Do not need at rest when at home. Will check chest xray today, and call you with results You need to see your ENT doctor if your nosebleeds continue. Will treat with levaquin 750mg  one a day for 7 days. Will treat with a course of prednisone over the next 8 days. If you worsen or not improving, I suspect this may be more cardiac, and need to get you to your cardiologist.  followup with me again in 3 weeks.

## 2013-12-31 DIAGNOSIS — R040 Epistaxis: Secondary | ICD-10-CM | POA: Diagnosis not present

## 2014-01-03 ENCOUNTER — Telehealth: Payer: Self-pay | Admitting: *Deleted

## 2014-01-03 NOTE — Telephone Encounter (Signed)
Patient recently seen by pulmonary (Dr. Gwenette Greet). CXR suggests increased edema. Have him increase Lasix to 40 mg BID for 3 days, then resume Lasix 40 mg QD. Check BMET in 1 week. Arrange f/u with me or Dr. Kirk Ruths in the next 1 week. Richardson Dopp, PA-C 01/01/2014 5:09 PM  Spoke w/wife and pt and informed them to increase Lasix 40 mg BID x 3 days then resume 40 mg daily.  Will come in for lab on 4/24 and then see Margaret Pyle on 4/28. They verbalize understanding.

## 2014-01-10 ENCOUNTER — Other Ambulatory Visit: Payer: Medicare Other

## 2014-01-14 ENCOUNTER — Ambulatory Visit (INDEPENDENT_AMBULATORY_CARE_PROVIDER_SITE_OTHER): Payer: Medicare Other | Admitting: *Deleted

## 2014-01-14 ENCOUNTER — Ambulatory Visit: Payer: Medicare Other | Admitting: Physician Assistant

## 2014-01-14 DIAGNOSIS — I10 Essential (primary) hypertension: Secondary | ICD-10-CM

## 2014-01-14 LAB — BASIC METABOLIC PANEL
BUN: 15 mg/dL (ref 6–23)
CALCIUM: 9.2 mg/dL (ref 8.4–10.5)
CO2: 27 mEq/L (ref 19–32)
CREATININE: 1.5 mg/dL (ref 0.4–1.5)
Chloride: 103 mEq/L (ref 96–112)
GFR: 49.49 mL/min — AB (ref >60.00)
Glucose, Bld: 123 mg/dL — ABNORMAL HIGH (ref 70–99)
Potassium: 3.8 mEq/L (ref 3.5–5.1)
SODIUM: 137 meq/L (ref 135–145)

## 2014-01-15 ENCOUNTER — Encounter: Payer: Self-pay | Admitting: Cardiology

## 2014-01-15 NOTE — Telephone Encounter (Signed)
New Message ° °Pt returned call for results//SR  °

## 2014-01-15 NOTE — Telephone Encounter (Signed)
This encounter was created in error - please disregard.

## 2014-01-15 NOTE — Telephone Encounter (Signed)
New problem ° ° °Pt returning your call. °

## 2014-01-16 ENCOUNTER — Ambulatory Visit: Payer: Medicare Other | Admitting: Cardiology

## 2014-01-17 ENCOUNTER — Encounter: Payer: Self-pay | Admitting: Physician Assistant

## 2014-01-17 ENCOUNTER — Ambulatory Visit (INDEPENDENT_AMBULATORY_CARE_PROVIDER_SITE_OTHER): Payer: Medicare Other | Admitting: *Deleted

## 2014-01-17 ENCOUNTER — Ambulatory Visit (INDEPENDENT_AMBULATORY_CARE_PROVIDER_SITE_OTHER): Payer: Medicare Other | Admitting: Physician Assistant

## 2014-01-17 VITALS — BP 112/66 | HR 79 | Ht 68.5 in | Wt 195.0 lb

## 2014-01-17 DIAGNOSIS — J449 Chronic obstructive pulmonary disease, unspecified: Secondary | ICD-10-CM

## 2014-01-17 DIAGNOSIS — R0602 Shortness of breath: Secondary | ICD-10-CM | POA: Diagnosis not present

## 2014-01-17 DIAGNOSIS — I4891 Unspecified atrial fibrillation: Secondary | ICD-10-CM

## 2014-01-17 DIAGNOSIS — Z9889 Other specified postprocedural states: Secondary | ICD-10-CM

## 2014-01-17 DIAGNOSIS — I10 Essential (primary) hypertension: Secondary | ICD-10-CM

## 2014-01-17 DIAGNOSIS — I5032 Chronic diastolic (congestive) heart failure: Secondary | ICD-10-CM | POA: Diagnosis not present

## 2014-01-17 DIAGNOSIS — I2510 Atherosclerotic heart disease of native coronary artery without angina pectoris: Secondary | ICD-10-CM | POA: Diagnosis not present

## 2014-01-17 DIAGNOSIS — I4892 Unspecified atrial flutter: Secondary | ICD-10-CM

## 2014-01-17 DIAGNOSIS — Z7901 Long term (current) use of anticoagulants: Secondary | ICD-10-CM

## 2014-01-17 DIAGNOSIS — E7800 Pure hypercholesterolemia, unspecified: Secondary | ICD-10-CM

## 2014-01-17 DIAGNOSIS — Z8679 Personal history of other diseases of the circulatory system: Secondary | ICD-10-CM | POA: Diagnosis not present

## 2014-01-17 LAB — POCT INR: INR: 3

## 2014-01-17 NOTE — Progress Notes (Signed)
[address], [place], [place]  [address] Phone: [phone] Fax:  [phone]  Date:  01/17/2014   ID:  JAK HAGGAR, DOB October 28, 1935, MRN [id number]  PCP:  Unice Cobble, MD  Cardiologist:  Dr. Kirk Ruths      History of Present Illness: [name] is a 78 y.o. 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  Last seen by Dr. Stanford Breed 11/2013.  followup echocardiogram demonstrated stable mitral valve and normal LV function. He was then seen by Dr. Gwenette Greet with worsening dyspnea. He used to antibiotics for COPD exacerbation. Chest x-ray was obtained and demonstrated pulmonary vascular congestion and probable slight interstitial edema.  We increased his Lasix for several days and asked him to come in today for follow up.  His cough has improved. However, his breathing is the same. He describes chronic dyspnea with exertion. He is NYHA class III. He denies chest discomfort. He denies syncope. He sleeps on 2 pillows chronically. He denies PND or edema. He thinks that his breathing has gotten worse over time.   Studies:  - LHC 12/06: Mid RCA 40-50%.  - Echo 11/13: EF 55%, trivial AI, mitral valve ring prosthesis present with mild stenosis (mean gradient 7 mm of mercury), moderate LAE, mild to moderate RVE, moderately reduced RVSF, moderate RAE, PASP 46.  - Echo (12/09/13):  Mod LVH, EF 55-60%, no RWMA, cannot assess diast fxn, mild AI, mean MV gradient 8 mmHg, mod LAE, severe RVE, mild reduced RVSF, mild RAE, mild TR, PASP 50 mmHg (mod Pul HTN),    CXR (12/30/13): IMPRESSION: Pulmonary vascular congestion with probable slight interstitial edema at the lung bases in slight atelectasis at the left base.   Recent Labs: 09/08/2013: Hemoglobin 13.5  12/09/2013: ALT 18; HDL Cholesterol 33.50*; LDL (calc) 72; Pro B  Natriuretic peptide (BNP) 83.0  01/14/2014: Creatinine 1.5; Potassium 3.8   Wt Readings from Last 3 Encounters:  01/17/14 195 lb (88.451 kg)  12/30/13 200 lb 9.6 oz (90.992 kg)  11/21/13 198 lb (89.812 kg)     Past Medical History  Diagnosis Date  . Mediastinal lymphadenopathy   . History of prostate cancer   . Atrial fibrillation   . COPD (chronic obstructive pulmonary disease)   . CAD (coronary artery disease)   . Adrenal adenoma   . Warthin's tumor   . Dyslipidemia   . Diverticulosis   . Embolic stroke     d/t endocarditis 2008  . Atrial flutter   . Ulcerative colitis     left-sided, associated with diverticulosis  . Obstructive chronic bronchitis   . HTN (hypertension)   . Thrombocytopenia   . Emphysema   . Right thyroid nodule   . Hemorrhoids   . TIA (transient ischemic attack) 03/25/2011    ? of due to transient diplopia    Current Outpatient Prescriptions  Medication Sig Dispense Refill  . albuterol (PROVENTIL HFA;VENTOLIN HFA) 108 (90 BASE) MCG/ACT inhaler Inhale 2 puffs into the lungs every 6 (six) hours as needed for wheezing or shortness of breath.  3 Inhaler  3  . diltiazem (CARDIZEM CD) 240 MG 24 hr capsule Take 1 capsule (240 mg total) by mouth daily.  90 capsule  3  . diltiazem (CARDIZEM) 120 MG tablet Take 120 mg by mouth daily.      . furosemide (LASIX) 40  MG tablet Take 1 tablet (40 mg total) by mouth daily as needed.  90 tablet  4  . pravastatin (PRAVACHOL) 40 MG tablet Take 1 tablet (40 mg total) by mouth every evening.  90 tablet  3  . predniSONE (DELTASONE) 10 MG tablet Take 4 tabs po x 2 days, then 3 x 2 days, then 2 x 2 days, then 1 x 2 days then stop.  20 tablet  0  . sulfaSALAzine (AZULFIDINE) 500 MG tablet TAKE 1 TABLET TWICE DAILY  180 tablet  0  . Umeclidinium-Vilanterol (ANORO ELLIPTA) 62.5-25 MCG/INH AEPB Inhale 1 puff into the lungs daily.  180 each  3  . warfarin (COUMADIN) 5 MG tablet Take 1 tablet (5 mg total) by mouth as directed.  120  tablet  1   No current facility-administered medications for this visit.    Allergies:   Review of patient's allergies indicates no known allergies.   Social History:  The patient  reports that he quit smoking about 11 years ago. His smoking use included Cigarettes. He has a 180 pack-year smoking history. He has never used smokeless tobacco. He reports that he does not drink alcohol or use illicit drugs.   Family History:  The patient's family history includes Cancer in his mother; Heart disease in his father; Stroke in his father. There is no history of Colon cancer.   ROS:  Please see the history of present illness.   He has recently noticed some diplopia. He is not certain if it is monocular or binocular.   All other systems reviewed and negative.   PHYSICAL EXAM: VS:  BP 112/66  Pulse 79  Ht 5' 8.5" (1.74 m)  Wt 195 lb (88.451 kg)  BMI 29.21 kg/m2 Well nourished, well developed, in no acute distress HEENT: normal Neck: no JVD Cardiac:  normal S1, S2; irregularly irregular rhythm; no murmur Lungs:  Decreased breath sounds bilaterally with bibasilar crackles Abd: soft, nontender, no hepatomegaly Ext: no edema Skin: warm and dry Neuro:  CNs 2-12 intact, no focal abnormalities noted  EKG:  Atrial fibrillation, HR 79     ASSESSMENT AND PLAN:  1. Shortness of breath:  I suspect this is multifactorial and related to COPD, advanced age and diastolic CHF.  He appears to be euvolemic today.  His recent cough exacerbation has resolved. He thinks his breathing has worsened over time.  I has been 8+ years since his LHC.  I will arrange a Lexiscan Myoview to rule out ischemia. 2. Coronary atherosclerosis of native coronary artery:  He is not on ASA as he is on coumadin.  Continue statin. 3. Chronic diastolic heart failure:  Volume appears stable.  Continue current dose of Lasix.  We discussed how to take sliding scale Lasix.   4. COPD (chronic obstructive pulmonary disease):  Continue  followup with Dr. Gwenette Greet. 5. Atrial fibrillation:  Rate controlled.  Continue coumadin.  6. S/P mitral valve repair:  Recent echo with stable MV repair.  Continue SBE prophylaxis.   7. Essential hypertension, benign:  Controlled.  8. Pure hypercholesterolemia:  Continue statin. 9. Diplopia:  I have asked him to f/u with ophthalmology. 10. Disposition:  F/u with me in 1 month.   Signed, Richardson Dopp, PA-C  01/17/2014 12:32 PM

## 2014-01-17 NOTE — Patient Instructions (Addendum)
Weigh yourself every day (in the morning before you get dressed or eat). If your weight increases from one day to the next by 3 lbs or more, take an extra Lasix and call our office.    Your physician has requested that you have a lexiscan myoview. For further information please visit [email or web address]. Please follow instruction sheet, as given.  Your physician recommends that you schedule a follow-up appointment in: with Brynda Rim PA on day of your next coumadin check  [place]  Your physician recommends that you continue on your current medications as directed. Please refer to the Current Medication list given to you today.

## 2014-01-20 ENCOUNTER — Ambulatory Visit: Payer: Medicare Other | Admitting: Pulmonary Disease

## 2014-01-20 ENCOUNTER — Ambulatory Visit: Payer: Medicare Other | Admitting: Physician Assistant

## 2014-01-21 ENCOUNTER — Encounter: Payer: Self-pay | Admitting: Pulmonary Disease

## 2014-01-21 ENCOUNTER — Ambulatory Visit (INDEPENDENT_AMBULATORY_CARE_PROVIDER_SITE_OTHER): Payer: Medicare Other | Admitting: Pulmonary Disease

## 2014-01-21 VITALS — BP 130/80 | HR 50 | Temp 97.7°F | Ht 68.0 in | Wt 195.8 lb

## 2014-01-21 DIAGNOSIS — I2510 Atherosclerotic heart disease of native coronary artery without angina pectoris: Secondary | ICD-10-CM

## 2014-01-21 DIAGNOSIS — J438 Other emphysema: Secondary | ICD-10-CM

## 2014-01-21 DIAGNOSIS — J439 Emphysema, unspecified: Secondary | ICD-10-CM

## 2014-01-21 NOTE — Progress Notes (Signed)
   Subjective:    Patient ID: [name], male    DOB: July 12, 1936, 78 y.o.   MRN: [id number]  HPI The patient comes in today for followup of his known COPD.  He was sick at the last visit, and was treated with antibiotics and prednisone, with significant improvement in his symptoms. He was started on ambulatory oxygen at the last visit, but currently is not wearing it. We have tried him on many different bronchodilator regimens, the last being anoro, that he has seen no long lasting improvement. He denies having any acute worsening, and denies any chest congestion or purulent mucus. He is continuing to wear oxygen at night.   Review of Systems  Constitutional: Negative for fever and unexpected weight change.  HENT: Negative for congestion, dental problem, ear pain, nosebleeds, postnasal drip, rhinorrhea, sinus pressure, sneezing, sore throat and trouble swallowing.   Eyes: Negative for redness and itching.  Respiratory: Positive for shortness of breath. Negative for cough, chest tightness and wheezing.   Cardiovascular: Negative for palpitations and leg swelling.  Gastrointestinal: Negative for nausea and vomiting.  Genitourinary: Negative for dysuria.  Musculoskeletal: Negative for joint swelling.  Skin: Negative for rash.  Neurological: Negative for headaches.  Hematological: Does not bruise/bleed easily.  Psychiatric/Behavioral: Negative for dysphoric mood. The patient is not nervous/anxious.        Objective:   Physical Exam Overweight male in no acute distress Nose without purulence or discharge noted Neck without lymphadenopathy or thyromegaly Chest with decreased breath sounds, no active wheezing Cardiac exam with regular rate and rhythm Lower extremities with no significant edema, no cyanosis Alert and oriented, moves all 4 extremities.       Assessment & Plan:

## 2014-01-21 NOTE — Patient Instructions (Signed)
Will try striverdi 2 inhalations each am everyday until samples run out.  Do not use albuterol except for rescue. Let us know if this helps you, and we can send in prescription.  Will discontinue your oxygen with exertion, but continue your oxygen while sleeping.  Work on weight loss and exercise program followup with me in 52mos if doing well.

## 2014-01-21 NOTE — Assessment & Plan Note (Signed)
The patient overall feels that he is much improved from the last visit, but continues to have significant dyspnea on exertion. He has both with significant lung and cardiac disease, and is often difficult to ascertain which is giving him the most trouble. We have tried him on many different bronchodilator maintenance meds, and he has not seen a significant change. He takes albuterol at least 4 times a day, and thinks this helps him more than anything else. Therefore, I would like to give him a trial of striverdi.  I also stressed to him the importance of weight loss and staying as active as possible.

## 2014-02-03 ENCOUNTER — Encounter: Payer: Self-pay | Admitting: Cardiovascular Disease

## 2014-02-03 ENCOUNTER — Encounter: Payer: Self-pay | Admitting: Internal Medicine

## 2014-02-04 ENCOUNTER — Ambulatory Visit (HOSPITAL_COMMUNITY): Payer: Medicare Other | Attending: Cardiovascular Disease | Admitting: Radiology

## 2014-02-04 VITALS — BP 137/80 | HR 63 | Ht 68.0 in | Wt 193.0 lb

## 2014-02-04 DIAGNOSIS — I2510 Atherosclerotic heart disease of native coronary artery without angina pectoris: Secondary | ICD-10-CM | POA: Insufficient documentation

## 2014-02-04 DIAGNOSIS — I4891 Unspecified atrial fibrillation: Secondary | ICD-10-CM | POA: Diagnosis not present

## 2014-02-04 DIAGNOSIS — R0602 Shortness of breath: Secondary | ICD-10-CM | POA: Insufficient documentation

## 2014-02-04 MED ORDER — TECHNETIUM TC 99M SESTAMIBI GENERIC - CARDIOLITE
33.0000 | Freq: Once | INTRAVENOUS | Status: AC | PRN
  Administered 2014-02-04: 33 via INTRAVENOUS

## 2014-02-04 MED ORDER — REGADENOSON 0.4 MG/5ML IV SOLN
0.4000 mg | Freq: Once | INTRAVENOUS | Status: AC
Start: 2014-02-04 — End: 2014-02-04
  Administered 2014-02-04: 0.4 mg via INTRAVENOUS

## 2014-02-04 MED ORDER — TECHNETIUM TC 99M SESTAMIBI GENERIC - CARDIOLITE
11.0000 | Freq: Once | INTRAVENOUS | Status: AC | PRN
  Administered 2014-02-04: 11 via INTRAVENOUS

## 2014-02-04 NOTE — Progress Notes (Signed)
[facility] [address] [place], [place] [address] [phone]    Cardiology Nuclear Med Study  [name] is a 78 y.o. male     MRN : [id number]     DOB: 05/07/1936  Procedure Date: 02/04/2014  Nuclear Med Background Indication for Stress Test:  Evaluation for Ischemia History:  CAD, Cath (n/o dz.), hx MV repair, Afib/flutter (hx cardioversion), Echo 2015 EF 55-60%, COPD, Thrombocytopenia Cardiac Risk Factors: CVA, History of Smoking, Hypertension and Lipids  Symptoms:  DOE   Nuclear Pre-Procedure Caffeine/Decaff Intake:  None NPO After: 7:00pm   Lungs:  clear O2 Sat: 92% on room air. IV 0.9% NS with Angio Cath:  20g  IV Site: R Hand  IV Started by:  Matilde Haymaker, RN  Chest Size (in):  46 Cup Size: n/a  Height: 5\' 8"  (1.727 m)  Weight:  193 lb (87.544 kg)  BMI:  Body mass index is 29.35 kg/(m^2). Tech Comments:  n/a    Nuclear Med Study 1 or 2 day study: 1 day  Stress Test Type:  Lexiscan  Reading MD: n/a  Order Authorizing Provider:  Queen Blossom and Chelsea Primus  Resting Radionuclide: Technetium 26m Sestamibi  Resting Radionuclide Dose: 11.0 mCi   Stress Radionuclide:  Technetium 55m Sestamibi  Stress Radionuclide Dose: 33.0 mCi           Stress Protocol Rest HR: 63 Stress HR: 68  Rest BP: 137/80 Stress BP: 124/67  Exercise Time (min): n/a METS: n/a           Dose of Adenosine (mg):  n/a Dose of Lexiscan: 0.4 mg  Dose of Atropine (mg): n/a Dose of Dobutamine: n/a mcg/kg/min (at max HR)  Stress Test Technologist: Glade Lloyd, BS-ES  Nuclear Technologist:  Charlton Amor, CNMT     Rest Procedure:  Myocardial perfusion imaging was performed at rest 45 minutes following the intravenous administration of Technetium 75m Sestamibi. Rest ECG: Normal sinus rhythm, PACs, right bundle branch block  Stress Procedure:  The patient received IV Lexiscan 0.4 mg over 15-seconds.  Technetium 23m Sestamibi injected at  30-seconds.  Quantitative spect images were obtained after a 45 minute delay.  During the infusion of Lexiscan, the patient complained of lightheadedness, neck/head feeling funny,  SOB and feeling weak in the hips.  These symptoms began to resolve in recovery.  Stress ECG: No significant changes from baseline, occasional PVC  QPS Raw Data Images:  There is soft tissue attenuation from arms at side. Stress Images:  There is subtle decreased uptake along the lateral wall distribution seen at both rest and stress with no areas of ischemia identified. Rest Images:  As above. Otherwise homogeneous radiotracer uptake Subtraction (SDS):  No evidence of ischemia. Transient Ischemic Dilatation (Normal <1.22):  0.96 Lung/Heart Ratio (Normal <0.45):  0.35  Quantitative Gated Spect Images QGS EDV:  n/a QGS ESV:  n/a  Impression Exercise Capacity:  Lexiscan with no exercise. BP Response:  Normal blood pressure response. Clinical Symptoms:  Typical symptoms with pharmacologic agent. ECG Impression:  No significant ST segment change suggestive of ischemia. Comparison with Prior Nuclear Study: No previous nuclear study performed  Overall Impression:  Low risk stress nuclear study No areas of ischemia identified. Subtle defect along lateral distribution likely secondary to soft tissue attenuation from arms positioned at his side..  LV Ejection Fraction: Study not gated.  LV Wall Motion:  Study not gated echocardiogram 2015 ejection fraction 55-60%.  Candee Furbish, MD

## 2014-02-05 ENCOUNTER — Telehealth: Payer: Self-pay | Admitting: *Deleted

## 2014-02-05 ENCOUNTER — Encounter: Payer: Self-pay | Admitting: Physician Assistant

## 2014-02-05 NOTE — Telephone Encounter (Signed)
lmptcb for myoview results 

## 2014-02-05 NOTE — Telephone Encounter (Signed)
pt's wife notified about myoview results with verbal understanding and said she will let the pt know the good news.

## 2014-02-13 ENCOUNTER — Encounter: Payer: Self-pay | Admitting: Physician Assistant

## 2014-02-13 NOTE — Progress Notes (Signed)
Cardiology Office Note  Date:  02/14/2014   ID:  MILFRED KRAMMES, DOB 1936-04-03, MRN [id number]  PCP:  Unice Cobble, MD  Cardiologist:  Dr. Kirk Ruths      History of Present Illness: [name] is a 78 y.o. 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  Last seen by Dr. Stanford Breed 11/2013.  followup echocardiogram demonstrated stable mitral valve and normal LV function. He was then seen by Dr. Gwenette Greet with worsening dyspnea. He used to antibiotics for COPD exacerbation. Chest x-ray was obtained and demonstrated pulmonary vascular congestion and probable slight interstitial edema.  We increased his Lasix for several days.  I saw him 5/1 in follow up.  He noted chronic DOE (NYHA 3).  Myoview was arranged.  This was low risk without ischemia.  He returns for follow up.  He continues to note NYHA Class 3 dyspnea.  He denies chest pain, syncope, orthopnea, PND, edema.  He does not weigh himself.   Studies:  - LHC 12/06: Mid RCA 40-50%.  - Echo 11/13: EF 55%, trivial AI, mitral valve ring prosthesis present with mild stenosis (mean gradient 7 mm of mercury), moderate LAE, mild to moderate RVE, moderately reduced RVSF, moderate RAE, PASP 46.  - Echo (12/09/13):  Mod LVH, EF 55-60%, no RWMA, cannot assess diast fxn, mild AI, mean MV gradient 8 mmHg, mod LAE, severe RVE, mild reduced RVSF, mild RAE, mild TR, PASP 50 mmHg (mod Pul HTN),   - Lexiscan Myoview (01/2014): Lateral soft tissue attenuation, no ischemia, not gated, low risk   CXR (12/30/13): IMPRESSION: Pulmonary vascular congestion with probable slight interstitial edema at the lung bases in slight atelectasis at the left base.   Recent Labs: 09/08/2013: Hemoglobin 13.5  12/09/2013: ALT 18; HDL Cholesterol by NMR 33.50*; LDL (calc) 72; Pro B Natriuretic peptide (BNP)  83.0  01/14/2014: Creatinine 1.5; Potassium 3.8   Wt Readings from Last 3 Encounters:  02/14/14 190 lb (86.183 kg)  02/04/14 193 lb (87.544 kg)  01/21/14 195 lb 12.8 oz (88.814 kg)     Past Medical History  Diagnosis Date  . Mediastinal lymphadenopathy   . History of prostate cancer   . Atrial fibrillation   . COPD (chronic obstructive pulmonary disease)   . Adrenal adenoma   . Warthin's tumor   . Dyslipidemia   . Diverticulosis   . Embolic stroke     d/t endocarditis 2008  . Atrial flutter   . Ulcerative colitis     left-sided, associated with diverticulosis  . Obstructive chronic bronchitis   . HTN (hypertension)   . Thrombocytopenia   . Emphysema   . Right thyroid nodule   . Hemorrhoids   . TIA (transient ischemic attack) 03/25/2011    ? of due to transient diplopia  . CAD (coronary artery disease)     Lexiscan Myoview (01/2014): Lateral soft tissue attenuation, no ischemia, not gated, low risk    Current Outpatient Prescriptions  Medication Sig Dispense Refill  . albuterol (PROVENTIL HFA;VENTOLIN HFA) 108 (90 BASE) MCG/ACT inhaler Inhale 2 puffs into the lungs every 6 (six) hours as needed for wheezing or shortness of breath.  3 Inhaler  3  . diltiazem (CARDIZEM CD) 240 MG 24 hr capsule Take 1 capsule (240 mg total) by mouth daily.  90 capsule  3  . diltiazem (CARDIZEM) 120 MG tablet  Take 120 mg by mouth daily.      . furosemide (LASIX) 40 MG tablet Take 1 tablet (40 mg total) by mouth daily as needed.  90 tablet  4  . pravastatin (PRAVACHOL) 40 MG tablet Take 1 tablet (40 mg total) by mouth every evening.  90 tablet  3  . sulfaSALAzine (AZULFIDINE) 500 MG tablet TAKE 1 TABLET TWICE DAILY  180 tablet  0  . warfarin (COUMADIN) 5 MG tablet Take 1 tablet (5 mg total) by mouth as directed.  120 tablet  1   No current facility-administered medications for this visit.    Allergies:   Review of patient's allergies indicates no known allergies.   Social History:  The  patient  reports that he quit smoking about 11 years ago. His smoking use included Cigarettes. He has a 180 pack-year smoking history. He has never used smokeless tobacco. He reports that he does not drink alcohol or use illicit drugs.   Family History:  The patient's family history includes Cancer in his mother; Heart disease in his father; Stroke in his father. There is no history of Colon cancer.   ROS:  Please see the history of present illness.   No bleeding problems.   All other systems reviewed and negative.   PHYSICAL EXAM: VS:  BP 129/60  Pulse 83  Ht 5\' 8"  (1.727 m)  Wt 190 lb (86.183 kg)  BMI 28.90 kg/m2 Well nourished, well developed, in no acute distress HEENT: normal Neck: no JVD Cardiac:  normal S1, S2; irregularly irregular rhythm; no murmur Lungs:  Decreased breath sounds bilaterally with bibasilar crackles Abd: soft, nontender, no hepatomegaly Ext: no edema Skin: warm and dry Neuro:  CNs 2-12 intact, no focal abnormalities noted  EKG:  Atrial fibrillation, HR 83    ASSESSMENT AND PLAN:  1. Shortness of breath:  I suspect this is multifactorial and related to COPD, advanced age and diastolic CHF.  Recent Lexiscan Myoview was low risk.  No further ischemic workup necessary.   2. Coronary atherosclerosis of native coronary artery:  Low Risk myoview.  No angina.  He is not on ASA as he is on coumadin.  Continue statin. 3. Chronic diastolic heart failure:  Volume appears stable.  Continue current dose of Lasix.  We discussed how to take sliding scale Lasix.  I have asked him to weigh on a daily basis. 4. COPD (chronic obstructive pulmonary disease):  Continue followup with Dr. Gwenette Greet. 5. Atrial fibrillation:  Rate controlled.  Continue coumadin.  6. S/P mitral valve repair:  Recent echo with stable MV repair.  Continue SBE prophylaxis.   7. Essential hypertension, benign:  Controlled.  8. Pure hypercholesterolemia:  Continue statin. 9. Disposition:  F/u with Dr. Kirk Ruths in 6 mos.   Signed, Richardson Dopp, PA-C  02/14/2014 10:03 AM

## 2014-02-14 ENCOUNTER — Ambulatory Visit (INDEPENDENT_AMBULATORY_CARE_PROVIDER_SITE_OTHER): Payer: Medicare Other | Admitting: Pharmacist

## 2014-02-14 ENCOUNTER — Ambulatory Visit (INDEPENDENT_AMBULATORY_CARE_PROVIDER_SITE_OTHER): Payer: Medicare Other | Admitting: Physician Assistant

## 2014-02-14 ENCOUNTER — Encounter: Payer: Self-pay | Admitting: Physician Assistant

## 2014-02-14 VITALS — BP 129/60 | HR 83 | Ht 68.0 in | Wt 190.0 lb

## 2014-02-14 DIAGNOSIS — I5032 Chronic diastolic (congestive) heart failure: Secondary | ICD-10-CM

## 2014-02-14 DIAGNOSIS — E7800 Pure hypercholesterolemia, unspecified: Secondary | ICD-10-CM

## 2014-02-14 DIAGNOSIS — I2510 Atherosclerotic heart disease of native coronary artery without angina pectoris: Secondary | ICD-10-CM | POA: Diagnosis not present

## 2014-02-14 DIAGNOSIS — I4891 Unspecified atrial fibrillation: Secondary | ICD-10-CM | POA: Diagnosis not present

## 2014-02-14 DIAGNOSIS — I4892 Unspecified atrial flutter: Secondary | ICD-10-CM | POA: Diagnosis not present

## 2014-02-14 DIAGNOSIS — Z9889 Other specified postprocedural states: Secondary | ICD-10-CM

## 2014-02-14 DIAGNOSIS — Z8679 Personal history of other diseases of the circulatory system: Secondary | ICD-10-CM | POA: Diagnosis not present

## 2014-02-14 DIAGNOSIS — Z7901 Long term (current) use of anticoagulants: Secondary | ICD-10-CM | POA: Diagnosis not present

## 2014-02-14 DIAGNOSIS — J449 Chronic obstructive pulmonary disease, unspecified: Secondary | ICD-10-CM

## 2014-02-14 DIAGNOSIS — I10 Essential (primary) hypertension: Secondary | ICD-10-CM

## 2014-02-14 DIAGNOSIS — R0602 Shortness of breath: Secondary | ICD-10-CM

## 2014-02-14 LAB — POCT INR: INR: 2.2

## 2014-02-14 NOTE — Patient Instructions (Addendum)
Your physician recommends that you continue on your current medications as directed. Please refer to the Current Medication list given to you today.   Your physician wants you to follow-up in: [place] will receive a reminder letter in the mail two months in advance. If you don't receive a letter, please call our office to schedule the follow-up appointment.

## 2014-02-18 ENCOUNTER — Other Ambulatory Visit: Payer: Self-pay | Admitting: Internal Medicine

## 2014-02-18 NOTE — Telephone Encounter (Signed)
LM for patient to call back , needs appointment.  Also lab work due, orders in.

## 2014-02-20 NOTE — Telephone Encounter (Signed)
LM on home VM to call me back.

## 2014-02-24 NOTE — Telephone Encounter (Signed)
Spoke with patient and his wife, appointment has been made so refill will be sent in.  They said next time they are in [place] they will come get labs drawn (CBC), orders in.

## 2014-03-07 ENCOUNTER — Other Ambulatory Visit (INDEPENDENT_AMBULATORY_CARE_PROVIDER_SITE_OTHER): Payer: Medicare Other

## 2014-03-07 DIAGNOSIS — D500 Iron deficiency anemia secondary to blood loss (chronic): Secondary | ICD-10-CM

## 2014-03-07 LAB — CBC WITH DIFFERENTIAL/PLATELET
BASOS ABS: 0.1 10*3/uL (ref 0.0–0.1)
Basophils Relative: 0.8 % (ref 0.0–3.0)
Eosinophils Absolute: 0.2 10*3/uL (ref 0.0–0.7)
Eosinophils Relative: 2.5 % (ref 0.0–5.0)
HCT: 41.3 % (ref 39.0–52.0)
Hemoglobin: 14 g/dL (ref 13.0–17.0)
Lymphocytes Relative: 40.6 % (ref 12.0–46.0)
Lymphs Abs: 2.8 10*3/uL (ref 0.7–4.0)
MCHC: 33.8 g/dL (ref 30.0–36.0)
MCV: 88.9 fl (ref 78.0–100.0)
MONO ABS: 0.6 10*3/uL (ref 0.1–1.0)
Monocytes Relative: 9.3 % (ref 3.0–12.0)
Neutro Abs: 3.2 10*3/uL (ref 1.4–7.7)
Neutrophils Relative %: 46.8 % (ref 43.0–77.0)
PLATELETS: 132 10*3/uL — AB (ref 150.0–400.0)
RBC: 4.65 Mil/uL (ref 4.22–5.81)
RDW: 14.2 % (ref 11.5–15.5)
WBC: 6.8 10*3/uL (ref 4.0–10.5)

## 2014-03-11 NOTE — Progress Notes (Signed)
Quick Note:  CBC ok except slightly low PLT's - have done this before No worries here Recheck in 6 months He should see me in office sometime this year re: left UC ______

## 2014-03-12 ENCOUNTER — Other Ambulatory Visit: Payer: Self-pay

## 2014-03-12 DIAGNOSIS — K5150 Left sided colitis without complications: Secondary | ICD-10-CM

## 2014-03-18 ENCOUNTER — Ambulatory Visit (INDEPENDENT_AMBULATORY_CARE_PROVIDER_SITE_OTHER): Payer: Medicare Other | Admitting: *Deleted

## 2014-03-18 DIAGNOSIS — Z7901 Long term (current) use of anticoagulants: Secondary | ICD-10-CM | POA: Diagnosis not present

## 2014-03-18 DIAGNOSIS — Z8679 Personal history of other diseases of the circulatory system: Secondary | ICD-10-CM

## 2014-03-18 DIAGNOSIS — Z5181 Encounter for therapeutic drug level monitoring: Secondary | ICD-10-CM

## 2014-03-18 DIAGNOSIS — I4892 Unspecified atrial flutter: Secondary | ICD-10-CM

## 2014-03-18 LAB — POCT INR: INR: 3.2

## 2014-04-01 ENCOUNTER — Ambulatory Visit (INDEPENDENT_AMBULATORY_CARE_PROVIDER_SITE_OTHER): Payer: Medicare Other | Admitting: *Deleted

## 2014-04-01 DIAGNOSIS — I4892 Unspecified atrial flutter: Secondary | ICD-10-CM

## 2014-04-01 DIAGNOSIS — Z5181 Encounter for therapeutic drug level monitoring: Secondary | ICD-10-CM | POA: Diagnosis not present

## 2014-04-01 DIAGNOSIS — Z7901 Long term (current) use of anticoagulants: Secondary | ICD-10-CM

## 2014-04-01 DIAGNOSIS — Z8679 Personal history of other diseases of the circulatory system: Secondary | ICD-10-CM

## 2014-04-01 LAB — POCT INR: INR: 3.1

## 2014-04-04 DIAGNOSIS — H532 Diplopia: Secondary | ICD-10-CM | POA: Diagnosis not present

## 2014-04-04 DIAGNOSIS — Z961 Presence of intraocular lens: Secondary | ICD-10-CM | POA: Diagnosis not present

## 2014-04-04 DIAGNOSIS — R51 Headache: Secondary | ICD-10-CM | POA: Diagnosis not present

## 2014-04-04 DIAGNOSIS — H02839 Dermatochalasis of unspecified eye, unspecified eyelid: Secondary | ICD-10-CM | POA: Diagnosis not present

## 2014-04-08 ENCOUNTER — Encounter: Payer: Self-pay | Admitting: Internal Medicine

## 2014-04-08 ENCOUNTER — Ambulatory Visit (INDEPENDENT_AMBULATORY_CARE_PROVIDER_SITE_OTHER): Payer: Medicare Other | Admitting: Internal Medicine

## 2014-04-08 ENCOUNTER — Other Ambulatory Visit (INDEPENDENT_AMBULATORY_CARE_PROVIDER_SITE_OTHER): Payer: Medicare Other

## 2014-04-08 VITALS — BP 130/64 | HR 42 | Temp 97.6°F | Wt 195.6 lb

## 2014-04-08 DIAGNOSIS — R42 Dizziness and giddiness: Secondary | ICD-10-CM

## 2014-04-08 DIAGNOSIS — I4891 Unspecified atrial fibrillation: Secondary | ICD-10-CM | POA: Diagnosis not present

## 2014-04-08 DIAGNOSIS — H532 Diplopia: Secondary | ICD-10-CM

## 2014-04-08 DIAGNOSIS — I2510 Atherosclerotic heart disease of native coronary artery without angina pectoris: Secondary | ICD-10-CM

## 2014-04-08 DIAGNOSIS — I482 Chronic atrial fibrillation, unspecified: Secondary | ICD-10-CM

## 2014-04-08 LAB — BASIC METABOLIC PANEL
BUN: 14 mg/dL (ref 6–23)
CALCIUM: 9.7 mg/dL (ref 8.4–10.5)
CHLORIDE: 101 meq/L (ref 96–112)
CO2: 29 mEq/L (ref 19–32)
CREATININE: 1.2 mg/dL (ref 0.4–1.5)
GFR: 61.33 mL/min (ref >60.00)
Glucose, Bld: 103 mg/dL — ABNORMAL HIGH (ref 70–99)
Potassium: 3.9 mEq/L (ref 3.5–5.1)
Sodium: 137 mEq/L (ref 135–145)

## 2014-04-08 NOTE — Progress Notes (Signed)
Pre visit review using our clinic review tool, if applicable. No additional management support is needed unless otherwise documented below in the visit note. 

## 2014-04-08 NOTE — Progress Notes (Signed)
Subjective:    Patient ID: [name], male    DOB: April 11, 1936, 78 y.o.   MRN: [id number]  HPI   He's had postural dizziness symptoms for the last 2 years intermittently. This is described as lightheadedness and "swimmy headedness". particularly with rising from the bed or chair or rotating his neck laterally in either direction. Additionally looking over his head can induce symptoms. There is no significant benign positional vertigo component  His symptoms last only 4 minutes. Sitting down & closing his eyes helps.  He fell last week walking by his lawnmower. He simply lost his balance.He landed on the mower without any apparent musculoskeletal injury. There was no cardiac or neurologic prodrome prior to that fall  He did have some blurred vision and double vision in late June while driving which lasted 4-5 minutes.He has seen his ophthalmologist who recommended followup here as well as with a neurologist.  He is chronically short of breath due to COPD. He stays in atrial fib.  PT/INR 3.1 on 7/28. ECHO 12/09/13 revealed no valvular lesions.    Review of Systems   He denies any constitutional symptoms. Specifically he denies fever, chills, sweats, or unexplained weight loss.  He's had no associated signs of rhinosinusitis. No frontal headache, facial pain, nasal purulence, otic pain, otic discharge.  He also denies hearing loss or tinnitus in the ears with the symptoms  He denies any limb weakness, numbness, or tingling.  He does have chronic balance issues.   Prior to his fall,denied were any change in heart rhythm or rate prior to the event. There was no associated chest pain or shortness of breath .  Also specifically denied prior to the episode were headache, limb weakness, tingling, or numbness. No seizure activity noted.         Objective:   Physical Exam  Significant or distinguishing  findings on physical exam are documented first.  Below that are other  systems examined & findings.   He appears his stated age;he is in no acute distress  He appears adequately nourished  He has complete dentures.  Nasal septum is deviated to the left. Nares are dry  He has an irregular/irregular rhythm which is slow  He has low-grade rales/rhonchi diffusely. There is no increased work of breathing  Abdomen is protuberant with a very large ventral hernia  Dorsalis pedis pulses are decreased  Right knee reflexes 0+; other deep tendon reflexes are normal.  Gen.:  well-nourished in appearance. Alert, appropriate and cooperative throughout exam  Head: Normocephalic without obvious abnormalities Eyes: No corneal or conjunctival inflammation noted. Pupils equal round reactive to light and accommodation. Extraocular motion intact.  Ears: External  ear exam reveals no significant lesions or deformities. Canals clear .TMs normal. Nose: External nasal exam reveals no deformity or inflammation. Nasal mucosa are pink and moist. No lesions or exudates noted.   Mouth: Oral mucosa and oropharynx reveal no lesions or exudates.  Neck: No deformities, masses, or tenderness noted. Range of motion decreased. Thyroid small  Heart: No gallop, click,  rub, or murmur. Abdomen: Bowel sounds normal; abdomen soft and nontender. No masses or organomegaly noted.                           Musculoskeletal/extremities: No deformity or scoliosis noted of  the thoracic or lumbar spine.   No clubbing, cyanosis, edema, or significant extremity  deformity noted. Tone & strength normal.  Fingernail health good.  Able to lie down & sit up w/o help. Negative SLR bilaterally Vascular: Carotid, radial artery,  and  posterior tibial pulses are full and equal. No bruits present. Neurologic: Alert and oriented x3. Deep tendon reflexes  Normal other than R knee..  Gait normal  including heel & toe walking . Rhomberg & finger to nose  Normal.      Skin: Intact without suspicious lesions or  rashes. Lymph: No cervical, axillary lymphadenopathy present. Psych: Mood and affect are normal. Normally interactive                                                                                  Supine BP: 122/70 Sitting BP:112/64 Standing BP:110/62                     Assessment & Plan:  #1 positional dizziness  #2 brief episode of diplopia in June which has not recurred. Doubt embolic phenomena due to ECHO & PT/INR results.  #3 some postural hypotension  #4 atrial fib, chronic warfarin therapy  Plan: See orders and recommendations

## 2014-04-08 NOTE — Patient Instructions (Addendum)
Perform isometric exercise of calves  ( while seated go up on toes to count of 5 & then onto heels for 5 count). Repeat  4- 5 times prior to standing if you've been seated or supine for any significant period of time as BP drops with such positions.   Your next office appointment will be determined based upon review of your pending labs & carotid Doppler. Those instructions will be transmitted to you  by mail.

## 2014-04-15 ENCOUNTER — Ambulatory Visit (INDEPENDENT_AMBULATORY_CARE_PROVIDER_SITE_OTHER): Payer: Medicare Other | Admitting: *Deleted

## 2014-04-15 ENCOUNTER — Telehealth: Payer: Self-pay

## 2014-04-15 ENCOUNTER — Other Ambulatory Visit: Payer: Self-pay | Admitting: Cardiology

## 2014-04-15 DIAGNOSIS — I4892 Unspecified atrial flutter: Secondary | ICD-10-CM | POA: Diagnosis not present

## 2014-04-15 DIAGNOSIS — Z5181 Encounter for therapeutic drug level monitoring: Secondary | ICD-10-CM

## 2014-04-15 DIAGNOSIS — Z8679 Personal history of other diseases of the circulatory system: Secondary | ICD-10-CM

## 2014-04-15 DIAGNOSIS — H532 Diplopia: Secondary | ICD-10-CM

## 2014-04-15 DIAGNOSIS — Z7901 Long term (current) use of anticoagulants: Secondary | ICD-10-CM

## 2014-04-15 LAB — POCT INR: INR: 2.5

## 2014-04-15 NOTE — Telephone Encounter (Signed)
Changed.

## 2014-04-15 NOTE — Telephone Encounter (Signed)
Message copied by Shelly Coss on Tue Apr 15, 2014 11:45 AM ------      Message from: Dorian Pod      Created: Tue Apr 15, 2014 11:27 AM      Regarding: carotid doppler       Please change order from imaging to vascular. EPIC code for carotid is VAS 01. Thank you! ------

## 2014-04-16 ENCOUNTER — Ambulatory Visit (HOSPITAL_COMMUNITY): Payer: Medicare Other | Attending: Internal Medicine | Admitting: *Deleted

## 2014-04-16 DIAGNOSIS — R42 Dizziness and giddiness: Secondary | ICD-10-CM | POA: Diagnosis not present

## 2014-04-16 DIAGNOSIS — I6529 Occlusion and stenosis of unspecified carotid artery: Secondary | ICD-10-CM | POA: Diagnosis not present

## 2014-04-16 DIAGNOSIS — H532 Diplopia: Secondary | ICD-10-CM | POA: Insufficient documentation

## 2014-04-16 NOTE — Progress Notes (Signed)
Carotid duplex complete 

## 2014-04-19 HISTORY — PX: OTHER SURGICAL HISTORY: SHX169

## 2014-04-23 ENCOUNTER — Other Ambulatory Visit: Payer: Self-pay | Admitting: Internal Medicine

## 2014-04-23 ENCOUNTER — Encounter: Payer: Self-pay | Admitting: Internal Medicine

## 2014-04-23 ENCOUNTER — Ambulatory Visit (INDEPENDENT_AMBULATORY_CARE_PROVIDER_SITE_OTHER): Payer: Medicare Other | Admitting: Internal Medicine

## 2014-04-23 VITALS — BP 124/60 | HR 80 | Ht 67.0 in | Wt 194.6 lb

## 2014-04-23 DIAGNOSIS — K5150 Left sided colitis without complications: Secondary | ICD-10-CM | POA: Diagnosis not present

## 2014-04-23 DIAGNOSIS — I2510 Atherosclerotic heart disease of native coronary artery without angina pectoris: Secondary | ICD-10-CM | POA: Diagnosis not present

## 2014-04-23 MED ORDER — SULFASALAZINE 500 MG PO TABS: 500.0000 mg | ORAL_TABLET | Freq: Two times a day (BID) | ORAL | Status: DC

## 2014-04-23 NOTE — Assessment & Plan Note (Addendum)
Refill sulfasalazine. He is in remission. Return to clinic in 1-2 years continue monitoring CBC at least twice a year.

## 2014-04-23 NOTE — Progress Notes (Signed)
   Subjective:    Patient ID: [name], male    DOB: [birth year], 78 y.o.   MRN: [id number]  HPI [name] is here for followup of a segmental colitis associated with diverticulosis he has been maintained on low-dose sulfasalazine has not had recurrence in many years. He is asking for refill. Medications, allergies, past medical history, past surgical history, family history and social history are reviewed and updated in the EMR.   Review of Systems Going well otherwise with chronic medical problems    Objective:   Physical Exam Elderly, no acute distress    Assessment & Plan:  Segmental colitis associated with sigmoid diverticulosis Refill sulfasalazine. He is in remission. Return to clinic in 1-2 years continue monitoring CBC at least twice a year.

## 2014-04-23 NOTE — Patient Instructions (Signed)
Glad things are ok.  I refilled your medication and will see you in about 1 year - sooner if needed.  I appreciate the opportunity to care for you. Gatha Mayer, MD, [facility]

## 2014-04-28 ENCOUNTER — Ambulatory Visit: Payer: Medicare Other | Admitting: Pulmonary Disease

## 2014-04-30 ENCOUNTER — Encounter: Payer: Self-pay | Admitting: Gastroenterology

## 2014-04-30 ENCOUNTER — Encounter: Payer: Self-pay | Admitting: Internal Medicine

## 2014-05-06 ENCOUNTER — Ambulatory Visit (INDEPENDENT_AMBULATORY_CARE_PROVIDER_SITE_OTHER): Payer: Medicare Other | Admitting: Pharmacist Clinician (PhC)/ Clinical Pharmacy Specialist

## 2014-05-06 DIAGNOSIS — Z7901 Long term (current) use of anticoagulants: Secondary | ICD-10-CM

## 2014-05-06 DIAGNOSIS — Z5181 Encounter for therapeutic drug level monitoring: Secondary | ICD-10-CM

## 2014-05-06 DIAGNOSIS — Z8679 Personal history of other diseases of the circulatory system: Secondary | ICD-10-CM

## 2014-05-06 DIAGNOSIS — I4892 Unspecified atrial flutter: Secondary | ICD-10-CM | POA: Diagnosis not present

## 2014-05-06 LAB — POCT INR: INR: 2.4

## 2014-06-03 ENCOUNTER — Ambulatory Visit (INDEPENDENT_AMBULATORY_CARE_PROVIDER_SITE_OTHER): Payer: Medicare Other | Admitting: *Deleted

## 2014-06-03 DIAGNOSIS — Z7901 Long term (current) use of anticoagulants: Secondary | ICD-10-CM | POA: Diagnosis not present

## 2014-06-03 DIAGNOSIS — I4892 Unspecified atrial flutter: Secondary | ICD-10-CM

## 2014-06-03 DIAGNOSIS — Z8679 Personal history of other diseases of the circulatory system: Secondary | ICD-10-CM | POA: Diagnosis not present

## 2014-06-03 DIAGNOSIS — Z5181 Encounter for therapeutic drug level monitoring: Secondary | ICD-10-CM | POA: Diagnosis not present

## 2014-06-03 LAB — POCT INR: INR: 2.5

## 2014-06-12 ENCOUNTER — Ambulatory Visit (INDEPENDENT_AMBULATORY_CARE_PROVIDER_SITE_OTHER): Payer: Medicare Other

## 2014-06-12 DIAGNOSIS — Z23 Encounter for immunization: Secondary | ICD-10-CM | POA: Diagnosis not present

## 2014-07-09 ENCOUNTER — Ambulatory Visit (INDEPENDENT_AMBULATORY_CARE_PROVIDER_SITE_OTHER): Payer: Medicare Other | Admitting: *Deleted

## 2014-07-09 DIAGNOSIS — I4892 Unspecified atrial flutter: Secondary | ICD-10-CM | POA: Diagnosis not present

## 2014-07-09 DIAGNOSIS — Z8679 Personal history of other diseases of the circulatory system: Secondary | ICD-10-CM | POA: Diagnosis not present

## 2014-07-09 DIAGNOSIS — Z5181 Encounter for therapeutic drug level monitoring: Secondary | ICD-10-CM | POA: Diagnosis not present

## 2014-07-09 DIAGNOSIS — Z7901 Long term (current) use of anticoagulants: Secondary | ICD-10-CM | POA: Diagnosis not present

## 2014-07-09 LAB — POCT INR: INR: 2.6

## 2014-07-14 ENCOUNTER — Telehealth: Payer: Self-pay

## 2014-07-14 NOTE — Telephone Encounter (Signed)
#  60 1 q 12 hrs prn only  This medication should be taken as needed only, not on routine basis. It can be associated with adverse drug to drug interactions & decreased alertness with increased fall risk

## 2014-07-14 NOTE — Telephone Encounter (Signed)
Received a fax from [facility] in [place] for refill request on Clonazepam 0.5 mg #60 take one tablet by mouth every 8 -12 hours as needed only. I do not see this on patient's present med list. Please advise.

## 2014-07-15 MED ORDER — CLONAZEPAM 0.5 MG PO TABS: 0.5000 mg | ORAL_TABLET | Freq: Two times a day (BID) | ORAL | Status: DC | PRN

## 2014-07-15 NOTE — Telephone Encounter (Signed)
Clonazepam has been called to [facility]

## 2014-07-25 ENCOUNTER — Ambulatory Visit: Payer: Medicare Other | Admitting: Pulmonary Disease

## 2014-08-11 ENCOUNTER — Encounter: Payer: Self-pay | Admitting: Pulmonary Disease

## 2014-08-11 ENCOUNTER — Ambulatory Visit (INDEPENDENT_AMBULATORY_CARE_PROVIDER_SITE_OTHER): Payer: Medicare Other | Admitting: Pulmonary Disease

## 2014-08-11 VITALS — BP 116/60 | HR 52 | Temp 98.0°F | Ht 67.0 in | Wt 202.4 lb

## 2014-08-11 DIAGNOSIS — J438 Other emphysema: Secondary | ICD-10-CM | POA: Diagnosis not present

## 2014-08-11 DIAGNOSIS — I2510 Atherosclerotic heart disease of native coronary artery without angina pectoris: Secondary | ICD-10-CM | POA: Diagnosis not present

## 2014-08-11 MED ORDER — OLODATEROL HCL 2.5 MCG/ACT IN AERS: 2.0000 | INHALATION_SPRAY | Freq: Every day | RESPIRATORY_TRACT | Status: DC

## 2014-08-11 NOTE — Assessment & Plan Note (Signed)
The patient has been tried on various categories of meds for COPD, and has really not seen a big change over albuterol alone. I did try him on a LABA at the last visit, and he cannot remember if it helped or not. He thinks it may have, and therefore I would like to try him on striverdi again. If he sees no change, I would probably just treat him with as needed albuterol alone. I have also stressed to him that his breathing issue is also coming from his heart disease and his weight and deconditioning.

## 2014-08-11 NOTE — Patient Instructions (Signed)
Will try again on striverdi, 2 inhalations each am everyday for one month.  You are to call me after that and we can see if this has helped you. While on this medicine, do not take proair unless you are having a hard time breathing even after sitting down Stay on oxygen at night Work on weight loss and conditioning. followup with me again in 92mos, but we need to talk in 4 weeks.

## 2014-08-11 NOTE — Progress Notes (Signed)
   Subjective:    Patient ID: [name], male    DOB: 1936-04-17, 78 y.o.   MRN: [id number]  HPI The patient comes in today for follow-up of his known COPD. He also has underlying heart disease and atrial arrhythmia. At the last visit I tried him on striverdi, but he cannot remember whether this helped or not. He has been tried on every other category of medications for COPD, without benefit. He has not had an acute exacerbation or pulmonary infection since the last visit. However, he has noted some increasing shortness of breath recently, and started back on anoro that he had leftover at home. He did not see any improvement in his breathing with this. Denies any significant cough or mucus production.   Review of Systems  Constitutional: Negative for fever and unexpected weight change.  HENT: Negative for congestion, dental problem, ear pain, nosebleeds, postnasal drip, rhinorrhea, sinus pressure, sneezing, sore throat and trouble swallowing.   Eyes: Negative for redness and itching.  Respiratory: Positive for cough, chest tightness, shortness of breath and wheezing.   Cardiovascular: Negative for palpitations and leg swelling.  Gastrointestinal: Negative for nausea and vomiting.  Genitourinary: Negative for dysuria.  Musculoskeletal: Negative for joint swelling.  Skin: Negative for rash.  Neurological: Negative for headaches.  Hematological: Does not bruise/bleed easily.  Psychiatric/Behavioral: Negative for dysphoric mood. The patient is not nervous/anxious.        Objective:   Physical Exam Obese male in no acute distress Nose without purulence or discharge noted Neck without lymphadenopathy or thyromegaly Chest with very diminished breath sounds, no active wheezing Cardiac exam with irregular rhythm, mildly increased ventricular response Lower extremities with mild edema, no cyanosis Alert and oriented, moves all 4 extremities.       Assessment & Plan:

## 2014-08-11 NOTE — Addendum Note (Signed)
Addended by: Inge Rise on: 08/11/2014 11:39 AM   Modules accepted: Orders

## 2014-08-13 ENCOUNTER — Telehealth: Payer: Self-pay | Admitting: Pulmonary Disease

## 2014-08-13 NOTE — Telephone Encounter (Signed)
I have not been given anything on pt. I have no forms on pt. If he is referring to patent assistance form, I have nothing on that either. Rx was sent to the pharmacy when pt was seen in office.

## 2014-08-13 NOTE — Telephone Encounter (Signed)
LMTCB x1 for pt.  

## 2014-08-13 NOTE — Telephone Encounter (Signed)
Patient states he was given a new inhaler at his last office visit.  He says that he completed a form to assist with payment on this drug and gave it to [place].  He wants to make sure that the form was sent in so he can get this medication.  To [facility].

## 2014-08-15 NOTE — Telephone Encounter (Signed)
lmtcb x2 

## 2014-08-18 NOTE — Telephone Encounter (Signed)
I called and spoke with the pt spouse to see what is needed. She states the pharmacy advised her that we need to call the insurance so they can get this medication. I found PA form in triage and initiated this over the phone. I was advised it went to clinical review and we will have a response in 24-48 hours. Pt aware. I will send message to [facility] to follow-up.   PA# [phone] Reference# [id number]

## 2014-08-18 NOTE — Telephone Encounter (Signed)
Pt wife calling back to check on status of prescript, says he really needs it.Hillery Hunter

## 2014-08-18 NOTE — Progress Notes (Signed)
HPI: FU MV repair, AFib/flutter, s/p MAZE procedure, nonobstructive CAD, prior embolic CVA after initial MV repair secondary to endocarditis requiring redo surgery (repair), diastolic CHF, COPD, HL, HTN.Since last seen, He continues to have dyspnea on exertion. No orthopnea, PND, pedal edema, chest pain, palpitations or syncope.  Studies: - LHC 12/06: Mid RCA 40-50%. - Echo (12/09/13): Mod LVH, EF 55-60%, no RWMA, cannot assess diast fxn, mild AI, mean MV gradient 8 mmHg, mod LAE, severe RVE, mild reduced RVSF, mild RAE, mild TR, PASP 50 mmHg (mod Pul HTN),  - Lexiscan Myoview (01/2014): Lateral soft tissue attenuation, no ischemia, not gated, low risk  - Carotid Dopplers August 2015 showed 1-39% bilateral stenosis and follow-up recommended in 2 years.  Current Outpatient Prescriptions  Medication Sig Dispense Refill  . albuterol (PROVENTIL HFA;VENTOLIN HFA) 108 (90 BASE) MCG/ACT inhaler Inhale 2 puffs into the lungs every 6 (six) hours as needed for wheezing or shortness of breath. 3 Inhaler 3  . AMBULATORY NON FORMULARY MEDICATION Oxygen , uses 2 liters nightly    . clonazePAM (KLONOPIN) 0.5 MG tablet Take 1 tablet (0.5 mg total) by mouth every 12 (twelve) hours as needed. 60 tablet 0  . diltiazem (CARDIZEM CD) 240 MG 24 hr capsule Take 1 capsule (240 mg total) by mouth daily. 90 capsule 3  . diltiazem (CARDIZEM) 120 MG tablet Take 120 mg by mouth daily.    . furosemide (LASIX) 40 MG tablet Take 1 tablet (40 mg total) by mouth daily as needed. 90 tablet 4  . Olodaterol HCl (STRIVERDI RESPIMAT) 2.5 MCG/ACT AERS Inhale 2 puffs into the lungs daily. 4 g 0  . pravastatin (PRAVACHOL) 40 MG tablet Take 1 tablet (40 mg total) by mouth every evening. 90 tablet 3  . sulfaSALAzine (AZULFIDINE) 500 MG tablet Take 1 tablet (500 mg total) by mouth 2 (two) times daily. 180 tablet 3  . Umeclidinium-Vilanterol (ANORO ELLIPTA) 62.5-25 MCG/INH AEPB Inhale 1 puff into the lungs daily.    Marland Kitchen warfarin  (COUMADIN) 5 MG tablet TAKE  1  TABLET  AS  DIRECTED 120 tablet 1   No current facility-administered medications for this visit.     Past Medical History  Diagnosis Date  . Mediastinal lymphadenopathy   . History of prostate cancer   . Atrial fibrillation   . COPD (chronic obstructive pulmonary disease)   . Adrenal adenoma   . Warthin's tumor   . Dyslipidemia   . Diverticulosis   . Embolic stroke     d/t endocarditis 2008  . Atrial flutter   . Ulcerative colitis     left-sided, associated with diverticulosis  . Obstructive chronic bronchitis   . HTN (hypertension)   . Thrombocytopenia   . Emphysema   . Right thyroid nodule   . Hemorrhoids   . TIA (transient ischemic attack) 03/25/2011    ? of due to transient diplopia  . CAD (coronary artery disease)     Lexiscan Myoview (01/2014): Lateral soft tissue attenuation, no ischemia, not gated, low risk    Past Surgical History  Procedure Laterality Date  . Mitral valve repair  09/22/04    and modified Cox-Maze IV   . Mitral valve repair  09/14/05    redo MV repair d/t endocardiits/embolic events  . Prostatectomy    . Inguinal hernia repair      right  . Cataract extraction, bilateral    . Salivary gland surgery      left resection  . Flexible  sigmoidoscopy  01/11/2008; 03/19/2010    2009 and 2011:segmental left colitis, diverticulosis, hemorrhoids  . Colonoscopy w/ biopsies  12/19/2007    left-sided colitis, diverticulosis, hemorrhoids    History   Social History  . Marital Status: Married    Spouse Name: Jeanett Schlein    Number of Children: 2  . Years of Education: N/A   Occupational History  . RETIRED     [employer]   .     Social History Main Topics  . Smoking status: Former Smoker -- 3.00 packs/day for 60 years    Types: Cigarettes    Quit date: 09/19/2002  . Smokeless tobacco: Never Used  . Alcohol Use: No  . Drug Use: No  . Sexual Activity: Not on file   Other Topics Concern  . Not on file   Social History  Narrative   Pt has children    ROS: no fevers or chills, productive cough, hemoptysis, dysphasia, odynophagia, melena, hematochezia, dysuria, hematuria, rash, seizure activity, orthopnea, PND, pedal edema, claudication. Remaining systems are negative.  Physical Exam: Well-developed well-nourished in no acute distress.  Skin is warm and dry.  HEENT is normal.  Neck is supple.  Chest is clear to auscultation with normal expansion.  Cardiovascular exam is regular rate and rhythm.  Abdominal exam nontender or distended. No masses palpated. Extremities show no edema. neuro grossly intact  ECG Sinus rhythm with occasional PVC, first-degree AV block, right axis deviation.

## 2014-08-19 ENCOUNTER — Ambulatory Visit (INDEPENDENT_AMBULATORY_CARE_PROVIDER_SITE_OTHER): Payer: Medicare Other | Admitting: Pharmacist

## 2014-08-19 ENCOUNTER — Ambulatory Visit (INDEPENDENT_AMBULATORY_CARE_PROVIDER_SITE_OTHER): Payer: Medicare Other | Admitting: Cardiology

## 2014-08-19 ENCOUNTER — Encounter: Payer: Self-pay | Admitting: Cardiology

## 2014-08-19 VITALS — BP 142/64 | HR 75 | Ht 67.5 in | Wt 197.8 lb

## 2014-08-19 DIAGNOSIS — Z8679 Personal history of other diseases of the circulatory system: Secondary | ICD-10-CM | POA: Diagnosis not present

## 2014-08-19 DIAGNOSIS — I4891 Unspecified atrial fibrillation: Secondary | ICD-10-CM | POA: Diagnosis not present

## 2014-08-19 DIAGNOSIS — I2510 Atherosclerotic heart disease of native coronary artery without angina pectoris: Secondary | ICD-10-CM

## 2014-08-19 DIAGNOSIS — E7800 Pure hypercholesterolemia, unspecified: Secondary | ICD-10-CM

## 2014-08-19 DIAGNOSIS — I4892 Unspecified atrial flutter: Secondary | ICD-10-CM

## 2014-08-19 DIAGNOSIS — Z7901 Long term (current) use of anticoagulants: Secondary | ICD-10-CM | POA: Diagnosis not present

## 2014-08-19 DIAGNOSIS — J438 Other emphysema: Secondary | ICD-10-CM | POA: Diagnosis not present

## 2014-08-19 DIAGNOSIS — Z5181 Encounter for therapeutic drug level monitoring: Secondary | ICD-10-CM | POA: Diagnosis not present

## 2014-08-19 LAB — POCT INR: INR: 3

## 2014-08-19 MED ORDER — DILTIAZEM HCL ER COATED BEADS 240 MG PO CP24: 240.0000 mg | ORAL_CAPSULE | Freq: Every day | ORAL | Status: DC

## 2014-08-19 MED ORDER — PRAVASTATIN SODIUM 40 MG PO TABS: 40.0000 mg | ORAL_TABLET | Freq: Every evening | ORAL | Status: DC

## 2014-08-19 MED ORDER — DILTIAZEM HCL 120 MG PO TABS: 120.0000 mg | ORAL_TABLET | Freq: Every day | ORAL | Status: DC

## 2014-08-19 MED ORDER — FUROSEMIDE 40 MG PO TABS: 40.0000 mg | ORAL_TABLET | Freq: Every day | ORAL | Status: DC | PRN

## 2014-08-19 NOTE — Patient Instructions (Signed)
Your physician wants you to follow-up in: [place] will receive a reminder letter in the mail two months in advance. If you don't receive a letter, please call our office to schedule the follow-up appointment.   Your physician recommends that you return for lab work WITH NEXT COUMADIN APPOINTMENT=DO NOT EAT PRIOR TO LAB WORK

## 2014-08-19 NOTE — Assessment & Plan Note (Signed)
Patient continues to have dyspnea. This is most likely multifactorial including COPD, deconditioning and possible diastolic congestive heart failure. He is euvolemic on examination. Continue present dose of Lasix. Check BNP.

## 2014-08-19 NOTE — Assessment & Plan Note (Signed)
Continue statin. Check lipids and liver. 

## 2014-08-19 NOTE — Assessment & Plan Note (Signed)
History of mitral valve repair. Continue SBE prophylaxis. 

## 2014-08-19 NOTE — Assessment & Plan Note (Signed)
Patient in sinus rhythm today. Continue Cardizem and Coumadin. Check hemoglobin.

## 2014-08-19 NOTE — Assessment & Plan Note (Signed)
Continue statin. Not on aspirin given the Coumadin.

## 2014-08-19 NOTE — Assessment & Plan Note (Signed)
Blood pressure controlled. Continue present medications. 

## 2014-08-20 NOTE — Telephone Encounter (Signed)
See if arcapta or foradil are on formulary,  Let pt know we are working on this.

## 2014-08-20 NOTE — Telephone Encounter (Signed)
Received denial for pt striverdi. Reports it is considered non formulary. To appeal will need a letter and medical records faxed to [phone] Please advise [place] thanks

## 2014-08-21 ENCOUNTER — Telehealth: Payer: Self-pay | Admitting: Pulmonary Disease

## 2014-08-21 MED ORDER — FORMOTEROL FUMARATE 12 MCG IN CAPS: 12.0000 ug | ORAL_CAPSULE | Freq: Two times a day (BID) | RESPIRATORY_TRACT | Status: DC

## 2014-08-21 NOTE — Telephone Encounter (Signed)
Ok to try foradil one capsule inhaled bid to see if it works for him

## 2014-08-21 NOTE — Telephone Encounter (Signed)
Called spoke with spouse. She is going to contact pt insurance to see if arcapta or foradil is on formulary. Once she does this, they will call our office back. Will await call back.

## 2014-08-21 NOTE — Telephone Encounter (Signed)
Called and spoke with pts wife and she is aware of [place] recs and she requested that the foradil be sent to the [facility].   This has been done and nothing further is needed.

## 2014-08-21 NOTE — Telephone Encounter (Signed)
Foradil is covered under pt insurance since striverdi is not. Please advise [place] thanks

## 2014-09-08 DIAGNOSIS — N393 Stress incontinence (female) (male): Secondary | ICD-10-CM | POA: Diagnosis not present

## 2014-09-08 DIAGNOSIS — N5201 Erectile dysfunction due to arterial insufficiency: Secondary | ICD-10-CM | POA: Diagnosis not present

## 2014-09-08 DIAGNOSIS — C61 Malignant neoplasm of prostate: Secondary | ICD-10-CM | POA: Diagnosis not present

## 2014-09-11 ENCOUNTER — Other Ambulatory Visit: Payer: Self-pay | Admitting: Cardiology

## 2014-09-26 ENCOUNTER — Encounter: Payer: Self-pay | Admitting: Internal Medicine

## 2014-09-26 ENCOUNTER — Ambulatory Visit (INDEPENDENT_AMBULATORY_CARE_PROVIDER_SITE_OTHER)
Admission: RE | Admit: 2014-09-26 | Discharge: 2014-09-26 | Disposition: A | Discharge status: Home / Self Care | Payer: Medicare Other | Source: Ambulatory Visit | Attending: Internal Medicine | Admitting: Internal Medicine

## 2014-09-26 ENCOUNTER — Other Ambulatory Visit: Payer: Medicare Other

## 2014-09-26 ENCOUNTER — Ambulatory Visit (INDEPENDENT_AMBULATORY_CARE_PROVIDER_SITE_OTHER): Payer: Medicare Other | Admitting: Internal Medicine

## 2014-09-26 ENCOUNTER — Telehealth: Payer: Self-pay | Admitting: Pulmonary Disease

## 2014-09-26 VITALS — BP 148/76 | HR 94 | Temp 98.0°F | Ht 67.5 in | Wt 200.8 lb

## 2014-09-26 DIAGNOSIS — R042 Hemoptysis: Secondary | ICD-10-CM | POA: Diagnosis not present

## 2014-09-26 DIAGNOSIS — J209 Acute bronchitis, unspecified: Secondary | ICD-10-CM

## 2014-09-26 DIAGNOSIS — J441 Chronic obstructive pulmonary disease with (acute) exacerbation: Secondary | ICD-10-CM | POA: Diagnosis not present

## 2014-09-26 DIAGNOSIS — M5137 Other intervertebral disc degeneration, lumbosacral region: Secondary | ICD-10-CM | POA: Diagnosis not present

## 2014-09-26 DIAGNOSIS — J440 Chronic obstructive pulmonary disease with acute lower respiratory infection: Principal | ICD-10-CM

## 2014-09-26 DIAGNOSIS — J811 Chronic pulmonary edema: Secondary | ICD-10-CM | POA: Diagnosis not present

## 2014-09-26 DIAGNOSIS — R0602 Shortness of breath: Secondary | ICD-10-CM | POA: Diagnosis not present

## 2014-09-26 DIAGNOSIS — R0902 Hypoxemia: Secondary | ICD-10-CM | POA: Diagnosis not present

## 2014-09-26 DIAGNOSIS — R0989 Other specified symptoms and signs involving the circulatory and respiratory systems: Secondary | ICD-10-CM | POA: Diagnosis not present

## 2014-09-26 DIAGNOSIS — M160 Bilateral primary osteoarthritis of hip: Secondary | ICD-10-CM | POA: Diagnosis not present

## 2014-09-26 MED ORDER — TRAMADOL HCL 50 MG PO TABS: 50.0000 mg | ORAL_TABLET | Freq: Three times a day (TID) | ORAL | Status: DC | PRN

## 2014-09-26 MED ORDER — AMOXICILLIN 500 MG PO CAPS: 500.0000 mg | ORAL_CAPSULE | Freq: Three times a day (TID) | ORAL | Status: DC

## 2014-09-26 MED ORDER — PREDNISONE 20 MG PO TABS: 20.0000 mg | ORAL_TABLET | Freq: Two times a day (BID) | ORAL | Status: DC

## 2014-09-26 MED ORDER — BENZONATATE 200 MG PO CAPS
ORAL_CAPSULE | ORAL | Status: DC
Start: 2014-09-26 — End: 2014-11-04

## 2014-09-26 NOTE — Telephone Encounter (Signed)
Patient returning call.

## 2014-09-26 NOTE — Telephone Encounter (Signed)
Let pt know that we have tried him on pretty much everything.  He has an apptm next week with me, and we can discuss further.

## 2014-09-26 NOTE — Telephone Encounter (Signed)
i called made spouse aware. Nothing further needed

## 2014-09-26 NOTE — Telephone Encounter (Signed)
Spoke with pt wife, states that Striverti is not working and has too much dust remaining in mouth after inhaling. Pt requesting an alternative. Upcoming appt 09/30/14 with [facility].  Please advise Dr Gwenette Greet. Thanks.

## 2014-09-26 NOTE — Patient Instructions (Signed)
Please stop the Azulfidine while you're on the antibiotic for your bronchitis.  Continue oxygen 24 hours a day until the acute bronchitis resolves.  You should not take aspirin, Advil, Nuprin, Aleve or other nonsteroidal agents while on the warfarin.

## 2014-09-26 NOTE — Progress Notes (Signed)
Pre visit review using our clinic review tool, if applicable. No additional management support is needed unless otherwise documented below in the visit note. 

## 2014-09-26 NOTE — Telephone Encounter (Signed)
lmtcb x1.  [place] but him on Striverdi, 2 puffs once a day.

## 2014-09-26 NOTE — Progress Notes (Signed)
   Subjective:    Patient ID: [name], male    DOB: 07/14/1936, 79 y.o.   MRN: [id number]  HPI  His symptoms began 09/24/14 as a cough productive of yellow phlegm. He also had some sore throat. He describes some itchy, watery eyes and sneezing.  The cough has been associated with shortness of breath and wheezing.  This morning he had some streaky hemoptysis  He does have COPD & is followed by Dr.Clance. He uses oxygen only  at night.  He does have occasional "skips" but denies other cardio vascular symptoms.  He specifically denies paroxysmal nocturnal dyspnea. He has no edema on furosemide every other day.  He has no upper respiratory tract infection symptoms.  His major complaint otherwise is pain in his hips and back which limit ambulation. Apparently he has had ESI of LS spine &  steroid injections in the hip by Dr. Veverly Fells. Dr. Veverly Fells has told him the next step is surgery. He has been taking Aleve 3 at bedtime. He was unaware that this was contraindicated while on warfarin.  He denies paroxysmal nocturnal dyspnea or edema. He is on furosemide every other day.  He is on  Sulfasalazine 500 mg for his colitis.  Review of Systems Frontal headache, facial pain , nasal purulence, dental pain, sore throat , otic pain or otic discharge denied. No fever , chills or sweats.  Chest pain,  tachycardia, or claudication  are absent.  Epistaxis,  hematuria, melena, or rectal bleeding denied. No unexplained weight loss, significant dyspepsia,dysphagia, or abdominal pain.  Abnormal bruising , bleeding, or difficulty stopping bleeding with injury as expected on warfarin.      Objective:   Physical Exam  Pertinent or positive findings include: He appears somewhat chronically ill and fatigued He has wax in the right canal but the tympanic membrane visualized is normal. He has complete dentures Heart rate is slightly irregular. He has rales at the bases  Abdomen is massive. There is  clubbing of the fingernails. Scarring and hyperpigmentation and some bruising is noted over the forearms. He ambulates slowly and deliberately complain of pain in his hips and back. He needs some help getting on the exam table  General appearance :adequately nourished; in no distress. Eyes: No conjunctival inflammation or scleral icterus is present. Oral exam:  Lips and gums are healthy appearing.There is no oropharyngeal erythema or exudate noted.  Heart:   without gallop, murmur, click, rub or other extra sounds   Lungs:No increased work of breathing.  Vascular : all pulses equal ; no bruits present. Skin:Warm & dry.  Intact without suspicious lesions or rashes ; no jaundice or tenting Lymphatic: No lymphadenopathy is noted about the head, neck, axilla          Assessment & Plan:  #1 he has acute bronchitis superimposed on COPD  #2 hemoptysis  #3 hypoxemia   #4 advanced degenerative disc disease of the lumbosacral spine.  #5 degenerative joint disease of the hips, end-stage  Plan: See orders recommendations

## 2014-09-30 ENCOUNTER — Other Ambulatory Visit (INDEPENDENT_AMBULATORY_CARE_PROVIDER_SITE_OTHER): Payer: Medicare Other | Admitting: *Deleted

## 2014-09-30 ENCOUNTER — Ambulatory Visit (INDEPENDENT_AMBULATORY_CARE_PROVIDER_SITE_OTHER): Payer: Medicare Other | Admitting: *Deleted

## 2014-09-30 ENCOUNTER — Ambulatory Visit: Payer: Medicare Other | Admitting: Pulmonary Disease

## 2014-09-30 DIAGNOSIS — E7800 Pure hypercholesterolemia, unspecified: Secondary | ICD-10-CM

## 2014-09-30 DIAGNOSIS — Z8679 Personal history of other diseases of the circulatory system: Secondary | ICD-10-CM

## 2014-09-30 DIAGNOSIS — I4891 Unspecified atrial fibrillation: Secondary | ICD-10-CM

## 2014-09-30 DIAGNOSIS — Z7901 Long term (current) use of anticoagulants: Secondary | ICD-10-CM

## 2014-09-30 DIAGNOSIS — R0602 Shortness of breath: Secondary | ICD-10-CM

## 2014-09-30 DIAGNOSIS — I4892 Unspecified atrial flutter: Secondary | ICD-10-CM

## 2014-09-30 DIAGNOSIS — Z5181 Encounter for therapeutic drug level monitoring: Secondary | ICD-10-CM | POA: Diagnosis not present

## 2014-09-30 LAB — CBC
HEMATOCRIT: 45.1 % (ref 39.0–52.0)
Hemoglobin: 14.8 g/dL (ref 13.0–17.0)
MCHC: 32.8 g/dL (ref 30.0–36.0)
MCV: 91.6 fl (ref 78.0–100.0)
Platelets: 209 10*3/uL (ref 150.0–400.0)
RBC: 4.92 Mil/uL (ref 4.22–5.81)
RDW: 13.6 % (ref 11.5–15.5)
WBC: 11.6 10*3/uL — AB (ref 4.0–10.5)

## 2014-09-30 LAB — LIPID PANEL
CHOLESTEROL: 133 mg/dL (ref 0–200)
HDL: 36.8 mg/dL — AB (ref >39.00)
LDL Cholesterol: 82 mg/dL (ref 0–99)
NONHDL: 96.2
Total CHOL/HDL Ratio: 4
Triglycerides: 69 mg/dL (ref 0.0–149.0)
VLDL: 13.8 mg/dL (ref 0.0–40.0)

## 2014-09-30 LAB — BASIC METABOLIC PANEL
BUN: 24 mg/dL — ABNORMAL HIGH (ref 6–23)
CALCIUM: 9.4 mg/dL (ref 8.4–10.5)
CHLORIDE: 102 meq/L (ref 96–112)
CO2: 27 mEq/L (ref 19–32)
CREATININE: 1.3 mg/dL (ref 0.4–1.5)
GFR: 56.42 mL/min — ABNORMAL LOW (ref >60.00)
GLUCOSE: 147 mg/dL — AB (ref 70–99)
Potassium: 4.3 mEq/L (ref 3.5–5.1)
Sodium: 134 mEq/L — ABNORMAL LOW (ref 135–145)

## 2014-09-30 LAB — HEPATIC FUNCTION PANEL
ALT: 19 U/L (ref 0–53)
AST: 20 U/L (ref 0–37)
Albumin: 3.7 g/dL (ref 3.5–5.2)
Alkaline Phosphatase: 73 U/L (ref 39–117)
Bilirubin, Direct: 0.1 mg/dL (ref 0.0–0.3)
Total Bilirubin: 0.8 mg/dL (ref 0.2–1.2)
Total Protein: 7.6 g/dL (ref 6.0–8.3)

## 2014-09-30 LAB — BRAIN NATRIURETIC PEPTIDE: Pro B Natriuretic peptide (BNP): 157 pg/mL — ABNORMAL HIGH (ref 0.0–100.0)

## 2014-09-30 LAB — POCT INR: INR: 2.5

## 2014-11-04 ENCOUNTER — Encounter (HOSPITAL_COMMUNITY): Payer: Self-pay | Admitting: Physical Medicine and Rehabilitation

## 2014-11-04 ENCOUNTER — Emergency Department (HOSPITAL_COMMUNITY): Payer: Medicare Other

## 2014-11-04 ENCOUNTER — Inpatient Hospital Stay (HOSPITAL_COMMUNITY)
Admission: EM | Admit: 2014-11-04 | Discharge: 2014-11-06 | DRG: 194 | Disposition: A | Discharge status: Home Health | Payer: Medicare Other | Attending: Internal Medicine | Admitting: Internal Medicine

## 2014-11-04 ENCOUNTER — Encounter: Payer: Self-pay | Admitting: Nurse Practitioner

## 2014-11-04 ENCOUNTER — Ambulatory Visit (INDEPENDENT_AMBULATORY_CARE_PROVIDER_SITE_OTHER): Payer: Medicare Other | Admitting: Nurse Practitioner

## 2014-11-04 VITALS — BP 116/60 | HR 83 | Temp 97.7°F | Ht 68.5 in | Wt 196.0 lb

## 2014-11-04 DIAGNOSIS — R0602 Shortness of breath: Secondary | ICD-10-CM

## 2014-11-04 DIAGNOSIS — J438 Other emphysema: Secondary | ICD-10-CM | POA: Diagnosis not present

## 2014-11-04 DIAGNOSIS — Z9079 Acquired absence of other genital organ(s): Secondary | ICD-10-CM | POA: Diagnosis present

## 2014-11-04 DIAGNOSIS — I2510 Atherosclerotic heart disease of native coronary artery without angina pectoris: Secondary | ICD-10-CM | POA: Diagnosis present

## 2014-11-04 DIAGNOSIS — E785 Hyperlipidemia, unspecified: Secondary | ICD-10-CM | POA: Diagnosis present

## 2014-11-04 DIAGNOSIS — I4891 Unspecified atrial fibrillation: Secondary | ICD-10-CM | POA: Diagnosis present

## 2014-11-04 DIAGNOSIS — Z9981 Dependence on supplemental oxygen: Secondary | ICD-10-CM

## 2014-11-04 DIAGNOSIS — J984 Other disorders of lung: Secondary | ICD-10-CM | POA: Diagnosis not present

## 2014-11-04 DIAGNOSIS — R000 Tachycardia, unspecified: Secondary | ICD-10-CM

## 2014-11-04 DIAGNOSIS — I4892 Unspecified atrial flutter: Secondary | ICD-10-CM | POA: Diagnosis present

## 2014-11-04 DIAGNOSIS — I10 Essential (primary) hypertension: Secondary | ICD-10-CM | POA: Diagnosis present

## 2014-11-04 DIAGNOSIS — D696 Thrombocytopenia, unspecified: Secondary | ICD-10-CM | POA: Diagnosis present

## 2014-11-04 DIAGNOSIS — Z8673 Personal history of transient ischemic attack (TIA), and cerebral infarction without residual deficits: Secondary | ICD-10-CM | POA: Diagnosis not present

## 2014-11-04 DIAGNOSIS — F17210 Nicotine dependence, cigarettes, uncomplicated: Secondary | ICD-10-CM | POA: Diagnosis present

## 2014-11-04 DIAGNOSIS — J441 Chronic obstructive pulmonary disease with (acute) exacerbation: Secondary | ICD-10-CM | POA: Diagnosis present

## 2014-11-04 DIAGNOSIS — J189 Pneumonia, unspecified organism: Principal | ICD-10-CM | POA: Insufficient documentation

## 2014-11-04 DIAGNOSIS — Z7901 Long term (current) use of anticoagulants: Secondary | ICD-10-CM

## 2014-11-04 DIAGNOSIS — J449 Chronic obstructive pulmonary disease, unspecified: Secondary | ICD-10-CM | POA: Diagnosis not present

## 2014-11-04 DIAGNOSIS — R590 Localized enlarged lymph nodes: Secondary | ICD-10-CM | POA: Diagnosis present

## 2014-11-04 DIAGNOSIS — E7800 Pure hypercholesterolemia, unspecified: Secondary | ICD-10-CM | POA: Diagnosis present

## 2014-11-04 DIAGNOSIS — Z8546 Personal history of malignant neoplasm of prostate: Secondary | ICD-10-CM | POA: Diagnosis not present

## 2014-11-04 DIAGNOSIS — R05 Cough: Secondary | ICD-10-CM | POA: Diagnosis not present

## 2014-11-04 DIAGNOSIS — J439 Emphysema, unspecified: Secondary | ICD-10-CM | POA: Diagnosis present

## 2014-11-04 LAB — CBC WITH DIFFERENTIAL/PLATELET
BASOS ABS: 0 10*3/uL (ref 0.0–0.1)
Basophils Relative: 1 % (ref 0–1)
Eosinophils Absolute: 0.1 10*3/uL (ref 0.0–0.7)
Eosinophils Relative: 1 % (ref 0–5)
HCT: 42.8 % (ref 39.0–52.0)
Hemoglobin: 14.7 g/dL (ref 13.0–17.0)
LYMPHS PCT: 24 % (ref 12–46)
Lymphs Abs: 1.7 10*3/uL (ref 0.7–4.0)
MCH: 30.3 pg (ref 26.0–34.0)
MCHC: 34.3 g/dL (ref 30.0–36.0)
MCV: 88.2 fL (ref 78.0–100.0)
MONOS PCT: 15 % — AB (ref 3–12)
Monocytes Absolute: 1.1 10*3/uL — ABNORMAL HIGH (ref 0.1–1.0)
NEUTROS ABS: 4.1 10*3/uL (ref 1.7–7.7)
Neutrophils Relative %: 59 % (ref 43–77)
Platelets: 137 10*3/uL — ABNORMAL LOW (ref 150–400)
RBC: 4.85 MIL/uL (ref 4.22–5.81)
RDW: 13.1 % (ref 11.5–15.5)
WBC: 7 10*3/uL (ref 4.0–10.5)

## 2014-11-04 LAB — EXPECTORATED SPUTUM ASSESSMENT W REFEX TO RESP CULTURE

## 2014-11-04 LAB — BASIC METABOLIC PANEL
Anion gap: 4 — ABNORMAL LOW (ref 5–15)
BUN: 16 mg/dL (ref 6–23)
CALCIUM: 9.1 mg/dL (ref 8.4–10.5)
CO2: 27 mmol/L (ref 19–32)
Chloride: 106 mmol/L (ref 96–112)
Creatinine, Ser: 1.28 mg/dL (ref 0.50–1.35)
GFR calc Af Amer: 61 mL/min — ABNORMAL LOW (ref >90)
GFR, EST NON AFRICAN AMERICAN: 53 mL/min — AB (ref >90)
GLUCOSE: 114 mg/dL — AB (ref 70–99)
Potassium: 4.5 mmol/L (ref 3.5–5.1)
Sodium: 137 mmol/L (ref 135–145)

## 2014-11-04 LAB — PROTIME-INR
INR: 1.91 — ABNORMAL HIGH (ref 0.00–1.49)
PROTHROMBIN TIME: 22.1 s — AB (ref 11.6–15.2)

## 2014-11-04 LAB — I-STAT TROPONIN, ED: TROPONIN I, POC: 0.02 ng/mL (ref 0.00–0.08)

## 2014-11-04 LAB — BRAIN NATRIURETIC PEPTIDE: B Natriuretic Peptide: 104.2 pg/mL — ABNORMAL HIGH (ref 0.0–100.0)

## 2014-11-04 LAB — EXPECTORATED SPUTUM ASSESSMENT W GRAM STAIN, RFLX TO RESP C

## 2014-11-04 MED ORDER — DILTIAZEM HCL 60 MG PO TABS: 120.0000 mg | ORAL_TABLET | Freq: Every day | ORAL | Status: DC

## 2014-11-04 MED ORDER — SENNA 8.6 MG PO TABS
1.0000 | ORAL_TABLET | Freq: Two times a day (BID) | ORAL | Status: DC
  Administered 2014-11-04 – 2014-11-06 (×4): 8.6 mg via ORAL
  Filled 2014-11-04 (×5): qty 1

## 2014-11-04 MED ORDER — CEFTRIAXONE SODIUM IN DEXTROSE 20 MG/ML IV SOLN
1.0000 g | INTRAVENOUS | Status: DC
  Filled 2014-11-04: qty 50

## 2014-11-04 MED ORDER — ALUM & MAG HYDROXIDE-SIMETH 200-200-20 MG/5ML PO SUSP: 30.0000 mL | Freq: Four times a day (QID) | ORAL | Status: DC | PRN

## 2014-11-04 MED ORDER — DOCUSATE SODIUM 100 MG PO CAPS
100.0000 mg | ORAL_CAPSULE | Freq: Two times a day (BID) | ORAL | Status: DC
  Administered 2014-11-04 – 2014-11-06 (×4): 100 mg via ORAL
  Filled 2014-11-04 (×6): qty 1

## 2014-11-04 MED ORDER — SODIUM CHLORIDE 0.9 % IJ SOLN
3.0000 mL | Freq: Two times a day (BID) | INTRAMUSCULAR | Status: DC
  Administered 2014-11-04 – 2014-11-06 (×3): 3 mL via INTRAVENOUS

## 2014-11-04 MED ORDER — CEFTRIAXONE SODIUM IN DEXTROSE 20 MG/ML IV SOLN
1.0000 g | INTRAVENOUS | Status: DC
  Administered 2014-11-05: 1 g via INTRAVENOUS
  Filled 2014-11-04 (×2): qty 50

## 2014-11-04 MED ORDER — PRAVASTATIN SODIUM 40 MG PO TABS
40.0000 mg | ORAL_TABLET | Freq: Every evening | ORAL | Status: DC
  Administered 2014-11-04 – 2014-11-05 (×2): 40 mg via ORAL
  Filled 2014-11-04 (×3): qty 1

## 2014-11-04 MED ORDER — TRAMADOL HCL 50 MG PO TABS
50.0000 mg | ORAL_TABLET | Freq: Three times a day (TID) | ORAL | Status: DC | PRN
Start: 2014-11-04 — End: 2014-11-06

## 2014-11-04 MED ORDER — ACETAMINOPHEN 325 MG PO TABS: 650.0000 mg | ORAL_TABLET | Freq: Four times a day (QID) | ORAL | Status: DC | PRN

## 2014-11-04 MED ORDER — IPRATROPIUM-ALBUTEROL 0.5-2.5 (3) MG/3ML IN SOLN
3.0000 mL | RESPIRATORY_TRACT | Status: DC
  Administered 2014-11-04 – 2014-11-05 (×2): 3 mL via RESPIRATORY_TRACT
  Filled 2014-11-04 (×2): qty 3

## 2014-11-04 MED ORDER — SODIUM CHLORIDE 0.9 % IV SOLN: 250.0000 mL | INTRAVENOUS | Status: DC | PRN

## 2014-11-04 MED ORDER — SODIUM CHLORIDE 0.9 % IJ SOLN: 3.0000 mL | INTRAMUSCULAR | Status: DC | PRN

## 2014-11-04 MED ORDER — SALMETEROL XINAFOATE 50 MCG/DOSE IN AEPB
1.0000 | INHALATION_SPRAY | Freq: Two times a day (BID) | RESPIRATORY_TRACT | Status: DC
  Administered 2014-11-04 – 2014-11-05 (×2): 1 via RESPIRATORY_TRACT
  Filled 2014-11-04: qty 0

## 2014-11-04 MED ORDER — SODIUM CHLORIDE 0.9 % IJ SOLN
3.0000 mL | Freq: Two times a day (BID) | INTRAMUSCULAR | Status: DC
  Administered 2014-11-05: 3 mL via INTRAVENOUS

## 2014-11-04 MED ORDER — ONDANSETRON HCL 4 MG/2ML IJ SOLN: 4.0000 mg | Freq: Four times a day (QID) | INTRAMUSCULAR | Status: DC | PRN

## 2014-11-04 MED ORDER — DILTIAZEM HCL ER COATED BEADS 120 MG PO CP24
120.0000 mg | ORAL_CAPSULE | Freq: Every day | ORAL | Status: DC
  Administered 2014-11-05 – 2014-11-06 (×2): 120 mg via ORAL
  Filled 2014-11-04 (×2): qty 1

## 2014-11-04 MED ORDER — METHYLPREDNISOLONE SODIUM SUCC 40 MG IJ SOLR
40.0000 mg | Freq: Two times a day (BID) | INTRAMUSCULAR | Status: DC
  Administered 2014-11-04 – 2014-11-05 (×2): 40 mg via INTRAVENOUS
  Filled 2014-11-04 (×4): qty 1

## 2014-11-04 MED ORDER — CLONAZEPAM 0.5 MG PO TABS
0.5000 mg | ORAL_TABLET | Freq: Two times a day (BID) | ORAL | Status: DC | PRN
  Administered 2014-11-05 (×2): 0.5 mg via ORAL
  Filled 2014-11-04 (×2): qty 1

## 2014-11-04 MED ORDER — WARFARIN SODIUM 7.5 MG PO TABS
7.5000 mg | ORAL_TABLET | Freq: Once | ORAL | Status: AC
  Administered 2014-11-04: 7.5 mg via ORAL
  Filled 2014-11-04: qty 1

## 2014-11-04 MED ORDER — DEXTROMETHORPHAN POLISTIREX 30 MG/5ML PO LQCR
30.0000 mg | Freq: Every evening | ORAL | Status: DC | PRN
  Administered 2014-11-05: 30 mg via ORAL
  Filled 2014-11-04 (×2): qty 5

## 2014-11-04 MED ORDER — DEXTROSE 5 % IV SOLN
500.0000 mg | INTRAVENOUS | Status: DC
  Filled 2014-11-04: qty 500

## 2014-11-04 MED ORDER — WARFARIN - PHARMACIST DOSING INPATIENT: Freq: Every day | Status: DC

## 2014-11-04 MED ORDER — DEXTROSE 5 % IV SOLN
500.0000 mg | Freq: Once | INTRAVENOUS | Status: AC
  Administered 2014-11-04: 500 mg via INTRAVENOUS
  Filled 2014-11-04: qty 500

## 2014-11-04 MED ORDER — ALBUTEROL SULFATE (2.5 MG/3ML) 0.083% IN NEBU
2.5000 mg | INHALATION_SOLUTION | RESPIRATORY_TRACT | Status: DC | PRN
  Administered 2014-11-04: 2.5 mg via RESPIRATORY_TRACT
  Filled 2014-11-04: qty 3

## 2014-11-04 MED ORDER — ACETAMINOPHEN 650 MG RE SUPP: 650.0000 mg | Freq: Four times a day (QID) | RECTAL | Status: DC | PRN

## 2014-11-04 MED ORDER — ONDANSETRON HCL 4 MG PO TABS: 4.0000 mg | ORAL_TABLET | Freq: Four times a day (QID) | ORAL | Status: DC | PRN

## 2014-11-04 MED ORDER — SULFASALAZINE 500 MG PO TABS: 500.0000 mg | ORAL_TABLET | Freq: Two times a day (BID) | ORAL | Status: DC

## 2014-11-04 MED ORDER — HYDROCODONE-ACETAMINOPHEN 5-325 MG PO TABS: 1.0000 | ORAL_TABLET | ORAL | Status: DC | PRN

## 2014-11-04 MED ORDER — GUAIFENESIN ER 600 MG PO TB12
1200.0000 mg | ORAL_TABLET | Freq: Two times a day (BID) | ORAL | Status: DC
  Administered 2014-11-04 – 2014-11-06 (×4): 1200 mg via ORAL
  Filled 2014-11-04 (×5): qty 2

## 2014-11-04 MED ORDER — CEFTRIAXONE SODIUM 1 G IJ SOLR
1.0000 g | Freq: Once | INTRAMUSCULAR | Status: AC
  Administered 2014-11-04: 1 g via INTRAVENOUS
  Filled 2014-11-04: qty 10

## 2014-11-04 MED ORDER — DEXTROSE 5 % IV SOLN
500.0000 mg | INTRAVENOUS | Status: DC
  Administered 2014-11-05: 500 mg via INTRAVENOUS
  Filled 2014-11-04 (×2): qty 500

## 2014-11-04 MED ORDER — DILTIAZEM HCL ER COATED BEADS 240 MG PO CP24
240.0000 mg | ORAL_CAPSULE | Freq: Every day | ORAL | Status: DC
  Administered 2014-11-05 – 2014-11-06 (×2): 240 mg via ORAL
  Filled 2014-11-04 (×2): qty 1

## 2014-11-04 MED ORDER — ENOXAPARIN SODIUM 40 MG/0.4ML ~~LOC~~ SOLN
40.0000 mg | SUBCUTANEOUS | Status: DC
  Administered 2014-11-04: 40 mg via SUBCUTANEOUS
  Filled 2014-11-04 (×2): qty 0.4

## 2014-11-04 MED ORDER — FUROSEMIDE 40 MG PO TABS
40.0000 mg | ORAL_TABLET | Freq: Every day | ORAL | Status: DC
  Administered 2014-11-05 – 2014-11-06 (×2): 40 mg via ORAL
  Filled 2014-11-04 (×3): qty 1

## 2014-11-04 NOTE — ED Notes (Signed)
Lab at bedside

## 2014-11-04 NOTE — ED Notes (Signed)
Pt resting quietly at the time. Vital signs stable. Denies pain. Respirations unlabored. Pt is alert and oriented x4. EDP at bedside to speak with patient and family.

## 2014-11-04 NOTE — Progress Notes (Signed)
Pre visit review using our clinic review tool, if applicable. No additional management support is needed unless otherwise documented below in the visit note. 

## 2014-11-04 NOTE — ED Notes (Signed)
Pt presents to department via [facility] from PCP office for evaluation of increasing SOB and atrial fibrillation (history of same). 96% 2L O2 upon arrival to ED. 18g LAC. Pt is alert and oriented x4. Denies pain.

## 2014-11-04 NOTE — ED Notes (Signed)
Pt reports increasing SOB over the past several days. Also states sensation of heart "skipping." Expiratory wheezing noted upon arrival to ED. Respirations unlabored. Pt states he should be wearing O2 at home during the day, but refuses, only wears O2 at night. Pt is alert and oriented x4.

## 2014-11-04 NOTE — ED Notes (Addendum)
Pt up to bathroom with assistance. Tolerated without difficulty.

## 2014-11-04 NOTE — H&P (Signed)
[facility] History and Physical                                                                                    [name], is a 79 y.o. male  MRN: [id number]   DOB - 1935-10-06  Admit Date - 11/04/2014  Outpatient Primary MD for the patient is Unice Cobble, MD  With History of -  Past Medical History  Diagnosis Date  . Mediastinal lymphadenopathy   . History of prostate cancer   . Atrial fibrillation   . COPD (chronic obstructive pulmonary disease)   . Adrenal adenoma   . Warthin's tumor   . Dyslipidemia   . Diverticulosis   . Embolic stroke     d/t endocarditis 2008  . Atrial flutter   . Ulcerative colitis     left-sided, associated with diverticulosis  . Obstructive chronic bronchitis   . HTN (hypertension)   . Thrombocytopenia   . Emphysema   . Right thyroid nodule   . Hemorrhoids   . TIA (transient ischemic attack) 03/25/2011    ? of due to transient diplopia  . CAD (coronary artery disease)     Lexiscan Myoview (01/2014): Lateral soft tissue attenuation, no ischemia, not gated, low risk      Past Surgical History  Procedure Laterality Date  . Mitral valve repair  09/22/04    and modified Cox-Maze IV   . Mitral valve repair  09/14/05    redo MV repair d/t endocardiits/embolic events  . Prostatectomy    . Inguinal hernia repair      right  . Cataract extraction, bilateral    . Salivary gland surgery      left resection  . Flexible sigmoidoscopy  01/11/2008; 03/19/2010    2009 and 2011:segmental left colitis, diverticulosis, hemorrhoids  . Colonoscopy w/ biopsies  12/19/2007    left-sided colitis, diverticulosis, hemorrhoids    in for   Chief Complaint  Patient presents with  . Shortness of Breath  . Atrial Fibrillation     HPI  [name]  is a 79 y.o. male, with history of COPD, atrial fibrillation, ulcerative colitis, prior embolic stroke, and coronary artery disease. Pt presents with shortness of breath and productive cough x 4 days. Sputum  is yellow with "bright red streaks of blood." Yesterday pt states he had chills all day and did not leave the bed. States he cannot walk further than across the hall for the past month without being short of breath.  COPD diagnosed 3-4 years ago. Pt no longer smokes, but has a 180 pack year history. He started smoking at 79 years of age. In ED, dyspnea present while conversing. He wears oxygen at home at night (2 L), but none during the day.  Denies chills or current cough. Reports he was unable to move bowels for 4 days. Last bowel movement was today. States abdomen is no more distended than normal, but still "feels full" and has the need to have another bowel movement.  Chest x-ray shows bilateral interstitial pneumonia in the upper lobes.  O2 saturations are 88% on room air. Serologies are unimpressive showing only thrombocytopenia (137), and a subtherapeutic  INR (1.91)   Review of Systems   In addition to the HPI above,  + chills No fever No Headache, No changes with Vision or hearing, No problems swallowing food or Liquids, No Chest pain +Productive cough, shortness of breath, dyspnea at rest and on exertion. No Abdominal pain, No Nausea or Vomiting, Bowel movements are irregular (last BM today, prior BM was 4 days ago) No Blood in stool or Urine, Dysuria (troubles initiating flow) No new skin rashes or bruises, + itching on back No new joints pains-aches, no edema  No new weakness, tingling, numbness in any extremity, No recent weight gain or loss, A full 10 point Review of Systems was done, except as stated above, all other Review of Systems were negative.  Social History History  Substance Use Topics  . Smoking status: Former Smoker -- 3.00 packs/day for 60 years    Types: Cigarettes    Quit date: 09/19/2002  . Smokeless tobacco: Never Used  . Alcohol Use: No  + for 180 pack year.  Family History Family History  Problem Relation Age of Onset  . Stroke Father   . Heart  disease Father   . Cancer Mother     spinal  . Colon cancer Neg Hx     Prior to Admission medications   Medication Sig Start Date End Date Taking? Authorizing Provider  albuterol (PROVENTIL HFA;VENTOLIN HFA) 108 (90 BASE) MCG/ACT inhaler Inhale 2 puffs into the lungs every 6 (six) hours as needed for wheezing or shortness of breath. 12/04/13   Kathee Delton, MD  AMBULATORY NON FORMULARY MEDICATION Oxygen , uses 2 liters nightly    Historical Provider, MD  clonazePAM (KLONOPIN) 0.5 MG tablet Take 1 tablet (0.5 mg total) by mouth every 12 (twelve) hours as needed. 07/15/14   Hendricks Limes, MD  diltiazem (CARDIZEM CD) 240 MG 24 hr capsule Take 1 capsule (240 mg total) by mouth daily. 08/19/14   Lelon Perla, MD  diltiazem (CARDIZEM) 120 MG tablet Take 1 tablet (120 mg total) by mouth daily. 08/19/14   Lelon Perla, MD  formoterol (FORADIL) 12 MCG capsule for inhaler Place 1 capsule (12 mcg total) into inhaler and inhale 2 (two) times daily. 08/21/14   Kathee Delton, MD  furosemide (LASIX) 40 MG tablet Take 1 tablet (40 mg total) by mouth daily as needed. 08/19/14   Lelon Perla, MD  Olodaterol HCl (STRIVERDI RESPIMAT) 2.5 MCG/ACT AERS Inhale 2 puffs into the lungs daily. 08/11/14   Kathee Delton, MD  pravastatin (PRAVACHOL) 40 MG tablet TAKE 1 TABLET EVERY EVENING 09/15/14   Lelon Perla, MD  sulfaSALAzine (AZULFIDINE) 500 MG tablet Take 1 tablet (500 mg total) by mouth 2 (two) times daily. 04/23/14   Gatha Mayer, MD  traMADol (ULTRAM) 50 MG tablet Take 1 tablet (50 mg total) by mouth every 8 (eight) hours as needed. 09/26/14   Hendricks Limes, MD  Umeclidinium-Vilanterol [facility] ELLIPTA) 62.5-25 MCG/INH AEPB Inhale 1 puff into the lungs daily.    Historical Provider, MD  warfarin (COUMADIN) 5 MG tablet TAKE  1  TABLET  AS  DIRECTED    Lelon Perla, MD    No Known Allergies  Physical Exam  Vitals  Blood pressure 111/56, pulse 91, temperature 98.8 F (37.1 C),  temperature source Oral, resp. rate 18, SpO2 95 %.   General:  lying in bed in mild respiratory distress, appears stated age, wife and sister at bedside  Psych:  Normal affect and insight, Awake Alert, Oriented X 3.  Neuro:   No F.N deficits, ALL C.Nerves Intact, Strength 5/5 all 4 extremities, Sensation intact all 4 extremities.  ENT:  Ears and Eyes appear Normal, Conjunctivae clear, PER. Moist oral mucosa without erythema or exudates.  Neck:  Supple, No lymphadenopathy appreciated  Respiratory:  Symmetrical chest wall movement, widespread wheezing, dyspnea at rest  Cardiac:  Irregularly irregular, No Murmurs, no LE edema noted, no JVD.    Abdomen:  Positive bowel sounds, Soft, Non tender, Non distended more than normal,  No masses appreciated  Skin:  No Cyanosis, Normal Skin Turgor, No Skin Rash or Bruising. Back clear of rash despite itching complaint.  Extremities:  Able to move all 4. 5/5 strength in each,  no effusions.  Data Review  CBC  Recent Labs Lab 11/04/14 1132  WBC 7.0  HGB 14.7  HCT 42.8  PLT 137*  MCV 88.2  MCH 30.3  MCHC 34.3  RDW 13.1  LYMPHSABS 1.7  MONOABS 1.1*  EOSABS 0.1  BASOSABS 0.0    Chemistries   Recent Labs Lab 11/04/14 1132  NA 137  K 4.5  CL 106  CO2 27  GLUCOSE 114*  BUN 16  CREATININE 1.28  CALCIUM 9.1    Coagulation profile  Recent Labs Lab 11/04/14 1345  INR 1.91*     Imaging results:   Dg Chest 2 View  11/04/2014   CLINICAL DATA:  Four days of shortness of breath, occasional hemoptysis, history of asthma and COPD and coronary artery disease ; previous heavy smoking history.  EXAM: CHEST  2 VIEW  COMPARISON:  PA and lateral chest x-ray of September 26, 2014  FINDINGS: The lungs are well-expanded. The interstitial markings are increased with areas of subtle confluence in the left upper lobe. The interstitial markings are coarse in both lower lung zones especially on the right but these are stable. The heart is normal  in size. The pulmonary vascularity is not engorged. A valve ring in the mitral position is demonstrated. There are 8 intact sternal wires. There is tortuosity of the descending thoracic aorta. There is no pleural effusion. There is multilevel degenerative disc disease of the thoracic spine.  IMPRESSION: COPD. Increased interstitial density predominantly in the upper lobes greater on the left than on the right consistent with interstitial pneumonia. No evidence of CHF. Followup radiographs following anticipated antibiotic therapy are recommended to assure complete clearing.   Electronically Signed   By: David  [place]   On: 11/04/2014 12:54    My personal review of EKG: atrial fibrillation    Assessment & Plan  Principal Problem:   Pneumonia Active Problems:   HYPERLIPIDEMIA, FAMILIAL   Essential hypertension, benign   COPD (chronic obstructive pulmonary disease) with emphysema   Atrial fibrillation   SOB (shortness of breath)  Community Acquired Pneumonia Started on Ceftriaxone 1g IV and Azithromycin 500mg  IV. Delsym PRN. Mucinex BID. Sputum culture, gram stain, HIV antibody, influenza panel, legionella antigen and strep pneumo urinary antigen pending.  Will admit as inpatient for treatment and supportive care.  COPD exacerbation Solu-Medrol 40 twice a day, Duoneb nebulizer Check oxygen sats while ambulating daily.  Hold prior to admission LABA inhalers.  Atrial Fibrillation History of unsuccessful cardioversion. Continue Cardizem.  Lovenox 40mg  injection and wafarin per pharmacy  Hyperlipidemia Pravastatin 40mg  continued Low sodium heart diet  Hypertension Continue Dilitiazem 24 hour capsule 240mg  Daily, dilitiazem 120mg  daily, lasix 40mg  daily.   Difficulty with urination States he has  difficulty with flow. He is status post prostatectomy. Will check a UA.  History of ulcerative colitis Continue sulfasalazine.  No acute issues.     DVT Prophylaxis:  SCD's and  warfarin  AM Labs Ordered, also please review Full Orders  Family Communication:   Spoke with wife and sister in the room  Code Status:  Full   Condition:  Guarded.   Disposition: Inpatient.  Time spent in minutes : 60  Foye Spurling, PA-S [place], PA-C  [facility] 11/04/2014, 3:51 PM After 7pm go to [email or web address] - [email or web address]  And look for the night coverage person covering me after hours  [facility]

## 2014-11-04 NOTE — Patient Instructions (Signed)
You will need to [facility] hospital for evaluation and treatment of abnormal heart rhythm.

## 2014-11-04 NOTE — ED Provider Notes (Signed)
CSN: [id number]     Arrival date & time 11/04/14  1049 History   None    Chief Complaint  Patient presents with  . Shortness of Breath  . Atrial Fibrillation      HPI  [name] presents for evaluation of shortness of breath with bloody cough. He states he's had symptoms for 2-3 days. Last few days he has literally been huddling under his electric blanket at home with cold and chills and rigors. More short of breath over the last 2 days. Has a cough with some blood-tinged sputum.      Past Medical History  Diagnosis Date  . Mediastinal lymphadenopathy   . History of prostate cancer   . Atrial fibrillation   . COPD (chronic obstructive pulmonary disease)   . Adrenal adenoma   . Warthin's tumor   . Dyslipidemia   . Diverticulosis   . Embolic stroke     d/t endocarditis 2008  . Atrial flutter   . Ulcerative colitis     left-sided, associated with diverticulosis  . Obstructive chronic bronchitis   . HTN (hypertension)   . Thrombocytopenia   . Emphysema   . Right thyroid nodule   . Hemorrhoids   . TIA (transient ischemic attack) 03/25/2011    ? of due to transient diplopia  . CAD (coronary artery disease)     Lexiscan Myoview (01/2014): Lateral soft tissue attenuation, no ischemia, not gated, low risk   Past Surgical History  Procedure Laterality Date  . Mitral valve repair  09/22/04    and modified Cox-Maze IV   . Mitral valve repair  09/14/05    redo MV repair d/t endocardiits/embolic events  . Prostatectomy    . Inguinal hernia repair      right  . Cataract extraction, bilateral    . Salivary gland surgery      left resection  . Flexible sigmoidoscopy  01/11/2008; 03/19/2010    2009 and 2011:segmental left colitis, diverticulosis, hemorrhoids  . Colonoscopy w/ biopsies  12/19/2007    left-sided colitis, diverticulosis, hemorrhoids   Family History  Problem Relation Age of Onset  . Stroke Father   . Heart disease Father   . Cancer Mother     spinal  . Colon  cancer Neg Hx    History  Substance Use Topics  . Smoking status: Former Smoker -- 3.00 packs/day for 60 years    Types: Cigarettes    Quit date: 09/19/2002  . Smokeless tobacco: Never Used  . Alcohol Use: No    Review of Systems  Constitutional: Negative for fever, chills, diaphoresis, appetite change and fatigue.  HENT: Negative for mouth sores, sore throat and trouble swallowing.   Eyes: Negative for visual disturbance.  Respiratory: Positive for cough and shortness of breath. Negative for chest tightness and wheezing.   Cardiovascular: Negative for chest pain.       Dyspnea on exertion to the point of 10 feet without symptoms.  Gastrointestinal: Negative for nausea, vomiting, abdominal pain, diarrhea and abdominal distention.  Endocrine: Negative for polydipsia, polyphagia and polyuria.  Genitourinary: Negative for dysuria, frequency and hematuria.  Musculoskeletal: Negative for gait problem.  Skin: Negative for color change, pallor and rash.  Neurological: Positive for weakness and light-headedness. Negative for dizziness, syncope and headaches.  Hematological: Does not bruise/bleed easily.  Psychiatric/Behavioral: Negative for behavioral problems and confusion.      Allergies  Review of patient's allergies indicates no known allergies.  Home Medications   Prior to Admission  medications   Medication Sig Start Date End Date Taking? Authorizing Provider  albuterol (PROVENTIL HFA;VENTOLIN HFA) 108 (90 BASE) MCG/ACT inhaler Inhale 2 puffs into the lungs every 6 (six) hours as needed for wheezing or shortness of breath. 12/04/13   Kathee Delton, MD  AMBULATORY NON FORMULARY MEDICATION Oxygen , uses 2 liters nightly    Historical Provider, MD  clonazePAM (KLONOPIN) 0.5 MG tablet Take 1 tablet (0.5 mg total) by mouth every 12 (twelve) hours as needed. 07/15/14   Hendricks Limes, MD  diltiazem (CARDIZEM CD) 240 MG 24 hr capsule Take 1 capsule (240 mg total) by mouth daily.  08/19/14   Lelon Perla, MD  diltiazem (CARDIZEM) 120 MG tablet Take 1 tablet (120 mg total) by mouth daily. 08/19/14   Lelon Perla, MD  formoterol (FORADIL) 12 MCG capsule for inhaler Place 1 capsule (12 mcg total) into inhaler and inhale 2 (two) times daily. 08/21/14   Kathee Delton, MD  furosemide (LASIX) 40 MG tablet Take 1 tablet (40 mg total) by mouth daily as needed. 08/19/14   Lelon Perla, MD  Olodaterol HCl (STRIVERDI RESPIMAT) 2.5 MCG/ACT AERS Inhale 2 puffs into the lungs daily. 08/11/14   Kathee Delton, MD  pravastatin (PRAVACHOL) 40 MG tablet TAKE 1 TABLET EVERY EVENING 09/15/14   Lelon Perla, MD  sulfaSALAzine (AZULFIDINE) 500 MG tablet Take 1 tablet (500 mg total) by mouth 2 (two) times daily. 04/23/14   Gatha Mayer, MD  traMADol (ULTRAM) 50 MG tablet Take 1 tablet (50 mg total) by mouth every 8 (eight) hours as needed. 09/26/14   Hendricks Limes, MD  Umeclidinium-Vilanterol [facility] ELLIPTA) 62.5-25 MCG/INH AEPB Inhale 1 puff into the lungs daily.    Historical Provider, MD  warfarin (COUMADIN) 5 MG tablet TAKE  1  TABLET  AS  DIRECTED    Lelon Perla, MD   BP 111/56 mmHg  Pulse 91  Temp(Src) 98.8 F (37.1 C) (Oral)  Resp 18  SpO2 95% Physical Exam  Constitutional: He is oriented to person, place, and time. He appears well-developed and well-nourished. No distress.  HENT:  Head: Normocephalic.  Eyes: Conjunctivae are normal. Pupils are equal, round, and reactive to light. No scleral icterus.  Neck: Normal range of motion. Neck supple. No thyromegaly present.  Cardiovascular: Normal rate and regular rhythm.  Exam reveals no gallop and no friction rub.   No murmur heard. A. fib with controlled ventricular response. No dependent edema.  Pulmonary/Chest: Effort normal and breath sounds normal. No respiratory distress. He has no wheezes. He has no rales.  Diffuse scattered rhonchi.  Abdominal: Soft. Bowel sounds are normal. He exhibits no distension. There is  no tenderness. There is no rebound.  Musculoskeletal: Normal range of motion.  Neurological: He is alert and oriented to person, place, and time.  Skin: Skin is warm and dry. No rash noted.  Psychiatric: He has a normal mood and affect. His behavior is normal.    ED Course  Procedures (including critical care time) Labs Review Labs Reviewed  CBC WITH DIFFERENTIAL/PLATELET - Abnormal; Notable for the following:    Platelets 137 (*)    Monocytes Relative 15 (*)    Monocytes Absolute 1.1 (*)    All other components within normal limits  BRAIN NATRIURETIC PEPTIDE - Abnormal; Notable for the following:    B Natriuretic Peptide 104.2 (*)    All other components within normal limits  BASIC METABOLIC PANEL - Abnormal; Notable  for the following:    Glucose, Bld 114 (*)    GFR calc non Af Amer 53 (*)    GFR calc Af Amer 61 (*)    Anion gap 4 (*)    All other components within normal limits  PROTIME-INR - Abnormal; Notable for the following:    Prothrombin Time 22.1 (*)    INR 1.91 (*)    All other components within normal limits  CULTURE, BLOOD (ROUTINE X 2)  CULTURE, BLOOD (ROUTINE X 2)  I-STAT TROPOININ, ED    Imaging Review Dg Chest 2 View  11/04/2014   CLINICAL DATA:  Four days of shortness of breath, occasional hemoptysis, history of asthma and COPD and coronary artery disease ; previous heavy smoking history.  EXAM: CHEST  2 VIEW  COMPARISON:  PA and lateral chest x-ray of September 26, 2014  FINDINGS: The lungs are well-expanded. The interstitial markings are increased with areas of subtle confluence in the left upper lobe. The interstitial markings are coarse in both lower lung zones especially on the right but these are stable. The heart is normal in size. The pulmonary vascularity is not engorged. A valve ring in the mitral position is demonstrated. There are 8 intact sternal wires. There is tortuosity of the descending thoracic aorta. There is no pleural effusion. There is  multilevel degenerative disc disease of the thoracic spine.  IMPRESSION: COPD. Increased interstitial density predominantly in the upper lobes greater on the left than on the right consistent with interstitial pneumonia. No evidence of CHF. Followup radiographs following anticipated antibiotic therapy are recommended to assure complete clearing.   Electronically Signed   By: David  Martinique   On: 11/04/2014 12:54     EKG Interpretation   Date/Time:  Tuesday November 04 2014 10:57:46 EST Ventricular Rate:  105 PR Interval:    QRS Duration: 106 QT Interval:  366 QTC Calculation: 484 R Axis:   -168 Text Interpretation:  Atrial fibrillation Paired ventricular premature  complexes Right axis deviation Probable anteroseptal infarct, old  Confirmed by Jeneen Rinks  MD, [place] ([id number]) on 11/04/2014 11:43:40 AM      MDM   Final diagnoses:  SOB (shortness of breath)  Community acquired pneumonia    Care discussed with hospitalist. Patient to be admitted. IV antibiotics started in the emergency room, following blood cultures.Tanna Furry, MD 11/04/14 [phone]

## 2014-11-04 NOTE — Progress Notes (Addendum)
Subjective:    [name] is a 79 y.o. male who presents for evaluation of shortness of breath and blood tinged sputum for 2 days, chills yesterday. Dyspnea has been moderate, and generally lasting all day.  He denies LE swelling. He has 2 family members who are paramedics that listened to breath sounds last night & said he had "crackles".The symptoms are aggravated by recumbency & activity, and relieved by inhaler: using albuterol daily with some relief, sitting up.   [name] has COPD-sees pulmonary. Reviewed [name] note dated 08/11/14. He added striverdi. Pt has not followed up. He sees cardiology for CHF & a-fib: reviewed last cardio note dated 08/19/14: FU MV repair, AFib/flutter, s/p MAZE procedure, nonobstructive CAD, prior embolic CVA after initial MV repair secondary to endocarditis requiring redo surgery (repair), diastolic CHF, COPD, HL, HTN. Pt is to take lasix daily, but he does not. He is concerned that he will get "dehydrated". He thinks he took last dose 2 days ago. He is not weighing daily.  The following portions of the patient's history were reviewed and updated as appropriate: allergies, current medications, past medical history, past social history, past surgical history and problem list.  Review of Systems Ears, nose, mouth, throat, and face: negative for nasal congestion and sore throat Respiratory: negative for wheezing Cardiovascular: negative for chest pressure/discomfort and lower extremity edema Gastrointestinal: positive for constipation, negative for abdominal pain, diarrhea and nausea    Objective:    Physical Exam BP 116/60 mmHg  Pulse 83  Temp(Src) 97.7 F (36.5 C) (Oral)  Ht 5' 8.5" (1.74 m)  Wt 196 lb (88.905 kg)  BMI 29.36 kg/m2  SpO2 88% General appearance: alert, cooperative, appears stated age and mild distress Head: Normocephalic, without obvious abnormality, atraumatic Eyes: conjunctivae/corneas clear. PERRL, EOM's intact. Fundi  benign. Lungs: clear to auscultation bilaterally Heart: irregularly irregular rhythm Abdomen: normal findings: no masses palpable and no organomegaly and abnormal findings:  obese Extremities: extremities normal, atraumatic, no cyanosis or edema Pulses: 2+ and symmetric Neurologic: Grossly normal  Cardiographics ECG: a-fib, single PVC, ventricular rate 92 Echocardiogram: not done  CXR Review: Interstitial edema (Jan, 2016) Lab Review:  Last BNP 157 (Jan, 2016)   Assessment:Plan  1. SOB (shortness of breath) BS clear No LE edema Hx COPD & CHF, a-fib SpO2 89% - EKG 12-Lead-a-fib, single PVC, Vent RATE 92  2. Tachycardia - EKG 12-Lead  3. Atrial fibrillation, unspecified Symptomatic-SOB  Spent 25 Minutes with patient, 50% or more was spent in counseling or coordination of care. EMS transport to [facility] hospital

## 2014-11-04 NOTE — ED Notes (Signed)
Attempted to call report x1. Nurse to call back. 

## 2014-11-04 NOTE — Progress Notes (Signed)
ANTICOAGULATION CONSULT NOTE - Initial Consult  Pharmacy Consult for warfarin Indication: atrial fibrillation  No Known Allergies  Patient Measurements:   Vital Signs: Temp: 98.4 F (36.9 C) (02/16 1701) Temp Source: Oral (02/16 1701) BP: 132/73 mmHg (02/16 1701) Pulse Rate: 95 (02/16 1701)  Labs:  Recent Labs  11/04/14 1132 11/04/14 1345  HGB 14.7  --   HCT 42.8  --   PLT 137*  --   LABPROT  --  22.1*  INR  --  1.91*  CREATININE 1.28  --     Estimated Creatinine Clearance: 53.7 mL/min (by C-G formula based on Cr of 1.28).   Medical History: Past Medical History  Diagnosis Date  . Mediastinal lymphadenopathy   . History of prostate cancer   . Atrial fibrillation   . COPD (chronic obstructive pulmonary disease)   . Adrenal adenoma   . Warthin's tumor   . Dyslipidemia   . Diverticulosis   . Embolic stroke     d/t endocarditis 2008  . Atrial flutter   . Ulcerative colitis     left-sided, associated with diverticulosis  . Obstructive chronic bronchitis   . HTN (hypertension)   . Thrombocytopenia   . Emphysema   . Right thyroid nodule   . Hemorrhoids   . TIA (transient ischemic attack) 03/25/2011    ? of due to transient diplopia  . CAD (coronary artery disease)     Lexiscan Myoview (01/2014): Lateral soft tissue attenuation, no ischemia, not gated, low risk    Medications:  Prescriptions prior to admission  Medication Sig Dispense Refill Last Dose  . albuterol (PROVENTIL HFA;VENTOLIN HFA) 108 (90 BASE) MCG/ACT inhaler Inhale 2 puffs into the lungs every 6 (six) hours as needed for wheezing or shortness of breath. 3 Inhaler 3 11/04/2014 at Unknown time  . AMBULATORY NON FORMULARY MEDICATION Oxygen , uses 2 liters nightly   11/04/2014 at Unknown time  . diltiazem (CARDIZEM CD) 240 MG 24 hr capsule Take 1 capsule (240 mg total) by mouth daily. 90 capsule 3 11/04/2014 at Unknown time  . diltiazem (CARDIZEM) 120 MG tablet Take 1 tablet (120 mg total) by mouth  daily. 90 tablet 3 11/04/2014 at Unknown time  . formoterol (FORADIL) 12 MCG capsule for inhaler Place 1 capsule (12 mcg total) into inhaler and inhale 2 (two) times daily. 180 capsule 1 11/03/2014 at Unknown time  . furosemide (LASIX) 40 MG tablet Take 1 tablet (40 mg total) by mouth daily as needed. 90 tablet 4 Past Week at Unknown time  . pravastatin (PRAVACHOL) 40 MG tablet TAKE 1 TABLET EVERY EVENING 90 tablet 3 11/03/2014 at Unknown time  . traMADol (ULTRAM) 50 MG tablet Take 1 tablet (50 mg total) by mouth every 8 (eight) hours as needed. 30 tablet 0 Past Month at Unknown time  . warfarin (COUMADIN) 5 MG tablet Take 5-7.5 mg by mouth daily. 5mg  daily and 7.5mg  on Tuesdays   11/03/2014 at Unknown time  . clonazePAM (KLONOPIN) 0.5 MG tablet Take 1 tablet (0.5 mg total) by mouth every 12 (twelve) hours as needed. (Patient not taking: Reported on 11/04/2014) 60 tablet 0 Not Taking at Unknown time  . Olodaterol HCl (STRIVERDI RESPIMAT) 2.5 MCG/ACT AERS Inhale 2 puffs into the lungs daily. (Patient not taking: Reported on 11/04/2014) 4 g 0 Not Taking at Unknown time  . sulfaSALAzine (AZULFIDINE) 500 MG tablet Take 1 tablet (500 mg total) by mouth 2 (two) times daily. (Patient not taking: Reported on 11/04/2014) 180 tablet 3  Not Taking at Unknown time  . Umeclidinium-Vilanterol (AN[facility]O ELLIPTA) 62.5-25 MCG/INH AEPB Inhale 1 puff into the lungs daily.   Not Taking at Unknown time  . warfarin (COUMADIN) 5 MG tablet TAKE  1  TABLET  AS  DIRECTED (Patient not taking: Reported on 11/04/2014) 120 tablet 1 Not Taking at Unknown time    Assessment: 79 year old man on warfarin for AFIB - home dose is 5mg  daily except 7.5mg  on Tuesday.  INR 1.91 on admission.  Last dose taken was 11/03/14. Goal of Therapy:  INR 2-3    Plan:  Warfarin 7.5mg  x 1 dose today Daily protimes  Candie Mile 11/04/2014,7:05 PM

## 2014-11-05 DIAGNOSIS — R0602 Shortness of breath: Secondary | ICD-10-CM

## 2014-11-05 LAB — BASIC METABOLIC PANEL
Anion gap: 11 (ref 5–15)
BUN: 17 mg/dL (ref 6–23)
CHLORIDE: 104 mmol/L (ref 96–112)
CO2: 19 mmol/L (ref 19–32)
Calcium: 9.3 mg/dL (ref 8.4–10.5)
Creatinine, Ser: 1.23 mg/dL (ref 0.50–1.35)
GFR calc Af Amer: 64 mL/min — ABNORMAL LOW (ref >90)
GFR calc non Af Amer: 55 mL/min — ABNORMAL LOW (ref >90)
GLUCOSE: 176 mg/dL — AB (ref 70–99)
POTASSIUM: 4 mmol/L (ref 3.5–5.1)
SODIUM: 134 mmol/L — AB (ref 135–145)

## 2014-11-05 LAB — CBC
HCT: 44.2 % (ref 39.0–52.0)
Hemoglobin: 14.9 g/dL (ref 13.0–17.0)
MCH: 30.3 pg (ref 26.0–34.0)
MCHC: 33.7 g/dL (ref 30.0–36.0)
MCV: 90 fL (ref 78.0–100.0)
PLATELETS: 153 10*3/uL (ref 150–400)
RBC: 4.91 MIL/uL (ref 4.22–5.81)
RDW: 13.2 % (ref 11.5–15.5)
WBC: 5.3 10*3/uL (ref 4.0–10.5)

## 2014-11-05 LAB — PROTIME-INR
INR: 2.2 — ABNORMAL HIGH (ref 0.00–1.49)
Prothrombin Time: 24.6 seconds — ABNORMAL HIGH (ref 11.6–15.2)

## 2014-11-05 LAB — INFLUENZA PANEL BY PCR (TYPE A & B)
H1N1FLUPCR: NOT DETECTED
INFLAPCR: NEGATIVE
Influenza B By PCR: NEGATIVE

## 2014-11-05 LAB — URINE MICROSCOPIC-ADD ON

## 2014-11-05 LAB — URINALYSIS, ROUTINE W REFLEX MICROSCOPIC
Bilirubin Urine: NEGATIVE
Glucose, UA: 500 mg/dL — AB
Ketones, ur: NEGATIVE mg/dL
Leukocytes, UA: NEGATIVE
Nitrite: NEGATIVE
Protein, ur: 30 mg/dL — AB
Specific Gravity, Urine: 1.02 (ref 1.005–1.030)
Urobilinogen, UA: 0.2 mg/dL (ref 0.0–1.0)
pH: 5 (ref 5.0–8.0)

## 2014-11-05 MED ORDER — WARFARIN SODIUM 5 MG PO TABS
5.0000 mg | ORAL_TABLET | Freq: Once | ORAL | Status: AC
  Administered 2014-11-05: 5 mg via ORAL
  Filled 2014-11-05: qty 1

## 2014-11-05 MED ORDER — IPRATROPIUM-ALBUTEROL 0.5-2.5 (3) MG/3ML IN SOLN
3.0000 mL | Freq: Three times a day (TID) | RESPIRATORY_TRACT | Status: DC
  Administered 2014-11-05 – 2014-11-06 (×3): 3 mL via RESPIRATORY_TRACT
  Filled 2014-11-05 (×2): qty 3

## 2014-11-05 MED ORDER — PREDNISONE 50 MG PO TABS
50.0000 mg | ORAL_TABLET | Freq: Every day | ORAL | Status: DC
  Administered 2014-11-06: 50 mg via ORAL
  Filled 2014-11-05 (×2): qty 1

## 2014-11-05 NOTE — Evaluation (Signed)
Physical Therapy Evaluation Patient Details Name: [name] MRN: [id number] DOB: Aug 07, 1936 Today's Date: 11/05/2014   History of Present Illness  Pt is a 79 y.o. male, with history of COPD, a-fib, ulcerative colitis, prior embolic stroke, and CAD. Pt presents with shortness of breath and productive cough x 4 days. Sputum is yellow with "bright red streaks of blood." Day before admission pt states he had chills all day and did not leave the bed. States he cannot walk further than across the hall for the past month without being short of breath.COPD diagnosed 3-4 years ago. Pt no longer smokes, but has a 180 pack year history. He started smoking at 79 years of age. He wears oxygen at home at night (2 L), but none during the day. Chest x-ray shows bilateral interstitial pneumonia in the upper lobes.  Clinical Impression  Pt admitted with above diagnosis. Pt currently with functional limitations due to the deficits listed below (see PT Problem List). At the time of PT eval pt was able to perform transfers and ambulation with min guard assist. Pt appears unsteady at times however does not require any assistance to maintain balance during gait training.   Pt will benefit from skilled PT to increase their independence and safety with mobility to allow discharge to the venue listed below. Pt may decline HHPT services, however feel it will be beneficial for continued energy conservation training, and to improve tolerance for functional activity with his daily tasks [place]   Pt ambulated on RA and O2 sats decreased to 86% with moderate DOE noted.     Follow Up Recommendations Home health PT;Supervision - Intermittent    Equipment Recommendations  None recommended by PT    Recommendations for Other Services       Precautions / Restrictions Precautions Precautions: Fall Restrictions Weight Bearing Restrictions: No      Mobility  Bed Mobility               General bed mobility comments: Pt was sitting up in recliner chair upon PT arrival.   Transfers Overall transfer level: Needs assistance Equipment used: None Transfers: Sit to/from Stand Sit to Stand: Min guard         General transfer comment: Pt was able to power-up to full standing without assistance. Close guard for safety.  Ambulation/Gait Ambulation/Gait assistance: Min guard Ambulation Distance (Feet): 50 Feet Assistive device: None Gait Pattern/deviations: Step-through pattern;Decreased stride length;Trendelenburg Gait velocity: Decreased Gait velocity interpretation: Below normal speed for age/gender General Gait Details: Pt appeared to be rushing his steps, however was not gaining distance quickly due to small step length. Pt appeared unsteady at times but did not require any assistance to maintain balance while ambulating.   Stairs            Wheelchair Mobility    Modified Rankin (Stroke Patients Only)       Balance Overall balance assessment: Needs assistance Sitting-balance support: Feet supported;No upper extremity supported Sitting balance-Leahy Scale: Good     Standing balance support: No upper extremity supported;During functional activity Standing balance-Leahy Scale: Fair                               Pertinent Vitals/Pain Pain Assessment: No/denies pain    Home Living Family/patient expects to be discharged to:: Private residence Living Arrangements: Spouse/significant other Available Help at Discharge: Family Type of Home:  House Home Access: Stairs to enter   Technical brewer of Steps: 1 Home Layout: Two level Home Equipment: [place] - single point      Prior Function Level of Independence: Independent               Hand Dominance   Dominant Hand: Right    Extremity/Trunk Assessment   Upper Extremity Assessment: Defer to OT evaluation           Lower Extremity Assessment: Overall WFL  for tasks assessed      Cervical / Trunk Assessment: Normal  Communication   Communication: No difficulties  Cognition Arousal/Alertness: Awake/alert Behavior During Therapy: WFL for tasks assessed/performed Overall Cognitive Status: Within Functional Limits for tasks assessed                      General Comments      Exercises        Assessment/Plan    PT Assessment Patient needs continued PT services  PT Diagnosis Difficulty walking   PT Problem List Decreased strength;Decreased range of motion;Decreased activity tolerance;Decreased balance;Decreased mobility;Decreased knowledge of use of DME;Decreased safety awareness;Decreased knowledge of precautions;Cardiopulmonary status limiting activity  PT Treatment Interventions DME instruction;Gait training;Stair training;Functional mobility training;Therapeutic activities;Therapeutic exercise;Neuromuscular re-education;Patient/family education   PT Goals (Current goals can be found in the Care Plan section) Acute Rehab PT Goals Patient Stated Goal: Return home with wife PT Goal Formulation: With patient Time For Goal Achievement: 11/12/14 Potential to Achieve Goals: Good    Frequency Min 3X/week   Barriers to discharge        Co-evaluation               End of Session Equipment Utilized During Treatment: Oxygen Activity Tolerance: Patient limited by fatigue Patient left: in chair;with call bell/phone within reach Nurse Communication: Mobility status         Time: 7824-2353 PT Time Calculation (min) (ACUTE ONLY): 22 min   Charges:   PT Evaluation $Initial PT Evaluation Tier I: 1 Procedure     PT G CodesRolinda Roan Nov 21, 2014, 8:33 AM  Rolinda Roan, PT, DPT Acute Rehabilitation Services Pager: [phone]

## 2014-11-05 NOTE — Progress Notes (Addendum)
[facility] PROGRESS NOTE  [name] WJX:[id number] DOB: [birth year] DOA: 11/04/2014 PCP: Unice Cobble, MD  Assessment/Plan: Principal Problem:   Pneumonia Active Problems:   HYPERLIPIDEMIA, FAMILIAL   Essential hypertension, benign   COPD (chronic obstructive pulmonary disease) with emphysema   Atrial fibrillation   SOB (shortness of breath)   Community acquired pneumonia    Community Acquired Pneumonia-improving Continue, day #2, Ceftriaxone 1g IV and Azithromycin 500mg  IV. Delsym PRN. Mucinex BID. Sputum culture, gram stain, HIV antibody, influenza panel pending, legionella antigen and strep pneumo urinary antigen pending.     COPD exacerbation Solu-Medrol 40 twice a day, Duoneb nebulizer, switch to by mouth prednisone in DC Solu-Medrol Continue LABA inhalers.serevent  Atrial Fibrillation History of unsuccessful cardioversion. Continue Cardizem.  Continue wafarin per pharmacy, INR 2.20  Hyperlipidemia Pravastatin 40mg  continued Low sodium heart diet  Hypertension Continue Dilitiazem 24 hour capsule 240mg  Daily, dilitiazem 120mg  daily, lasix 40mg  daily.   Difficulty with urination States he has difficulty with flow. He is status post prostatectomy. UA negative  History of ulcerative colitis Continue sulfasalazine. No acute issues.     DVT Prophylaxis: SCD's and warfarin  Code Status: full Family Communication: family updated about patient's clinical progress Disposition Plan:  Anticipate discharge tomorrow  Brief narrative: [name] is a 79 y.o. male, with history of COPD, atrial fibrillation, ulcerative colitis, prior embolic stroke, and coronary artery disease. Pt presents with shortness of breath and productive cough x 4 days. Sputum is yellow with "bright red streaks of blood." Yesterday pt states he had chills all day and did not leave the bed. States he cannot walk further than across the hall for the past month without being short  of breath. COPD diagnosed 3-4 years ago. Pt no longer smokes, but has a 180 pack year history. He started smoking at 79 years of age. In ED, dyspnea present while conversing. He wears oxygen at home at night (2 L), but none during the day. Denies chills or current cough. Reports he was unable to move bowels for 4 days. Last bowel movement was today. States abdomen is no more distended than normal, but still "feels full" and has the need to have another bowel movement.  Chest x-ray shows bilateral interstitial pneumonia in the upper lobes. O2 saturations are 88% on room air. Serologies are unimpressive showing only thrombocytopenia (137), and a subtherapeutic INR (1.91)  Consultants:  None  Procedures:  None  Antibiotics: Rocephin/azithromycin  HPI/Subjective: Patient states that he had a low-grade fever as well as chills at home, ambulating in the room, no complaints, currently requiring 2 L of oxygen  Objective: Filed Vitals:   11/04/14 2025 11/05/14 0043 11/05/14 0530 11/05/14 0802  BP:   128/75 140/75  Pulse:   111 93  Temp:   97.4 F (36.3 C)   TempSrc:   Oral   Resp:   17 20  Height:      Weight:      SpO2: 94% 95% 92% 93%    Intake/Output Summary (Last 24 hours) at 11/05/14 1230 Last data filed at 11/05/14 0800  Gross per 24 hour  Intake    960 ml  Output      2 ml  Net    958 ml    Exam:  General: No acute respiratory distress Lungs: Clear to auscultation bilaterally without wheezes or crackles Cardiovascular: Regular rate and rhythm without murmur gallop or rub normal S1 and S2 Abdomen: Nontender, nondistended, soft, bowel sounds positive, no rebound,  no ascites, no appreciable mass Extremities: No significant cyanosis, clubbing, or edema bilateral lower extremities      Data Reviewed: Basic Metabolic Panel:  Recent Labs Lab 11/04/14 1132 11/05/14 0544  NA 137 134*  K 4.5 4.0  CL 106 104  CO2 27 19  GLUCOSE 114* 176*  BUN 16 17  CREATININE  1.28 1.23  CALCIUM 9.1 9.3    Liver Function Tests: No results for input(s): AST, ALT, ALKPHOS, BILITOT, PROT, ALBUMIN in the last 168 hours. No results for input(s): LIPASE, AMYLASE in the last 168 hours. No results for input(s): AMMONIA in the last 168 hours.  CBC:  Recent Labs Lab 11/04/14 1132 11/05/14 0544  WBC 7.0 5.3  NEUTROABS 4.1  --   HGB 14.7 14.9  HCT 42.8 44.2  MCV 88.2 90.0  PLT 137* 153    Cardiac Enzymes: No results for input(s): CKTOTAL, CKMB, CKMBINDEX, TROPONINI in the last 168 hours. BNP (last 3 results)  Recent Labs  11/04/14 1136  BNP 104.2*    ProBNP (last 3 results)  Recent Labs  12/09/13 1350 09/30/14 0808  PROBNP 83.0 157.0*      CBG: No results for input(s): GLUCAP in the last 168 hours.  Recent Results (from the past 240 hour(s))  Culture, blood (routine x 2)     Status: None (Preliminary result)   Collection Time: 11/04/14  1:45 PM  Result Value Ref Range Status   Specimen Description BLOOD RIGHT FOREARM  Final   Special Requests BOTTLES DRAWN AEROBIC AND ANAEROBIC 5CC  Final   Culture   Final           BLOOD CULTURE RECEIVED NO GROWTH TO DATE CULTURE WILL BE HELD FOR 5 DAYS BEFORE ISSUING A FINAL NEGATIVE REPORT Performed at [facility]    Report Status PENDING  Incomplete  Culture, blood (routine x 2)     Status: None (Preliminary result)   Collection Time: 11/04/14  2:00 PM  Result Value Ref Range Status   Specimen Description BLOOD ARM LEFT  Final   Special Requests BOTTLES DRAWN AEROBIC AND ANAEROBIC 10CC  Final   Culture   Final           BLOOD CULTURE RECEIVED NO GROWTH TO DATE CULTURE WILL BE HELD FOR 5 DAYS BEFORE ISSUING A FINAL NEGATIVE REPORT Performed at [facility]    Report Status PENDING  Incomplete  Culture, sputum-assessment     Status: None   Collection Time: 11/04/14  8:46 PM  Result Value Ref Range Status   Specimen Description SPUTUM  Final   Special Requests Immunocompromised   Final   Sputum evaluation   Final    MICROSCOPIC FINDINGS SUGGEST THAT THIS SPECIMEN IS NOT REPRESENTATIVE OF LOWER RESPIRATORY SECRETIONS. PLEASE RECOLLECT. Results Called to: Zenon Mayo, RN [phone]    Report Status 11/04/2014 FINAL  Final     Studies: Dg Chest 2 View  11/04/2014   CLINICAL DATA:  Four days of shortness of breath, occasional hemoptysis, history of asthma and COPD and coronary artery disease ; previous heavy smoking history.  EXAM: CHEST  2 VIEW  COMPARISON:  PA and lateral chest x-ray of September 26, 2014  FINDINGS: The lungs are well-expanded. The interstitial markings are increased with areas of subtle confluence in the left upper lobe. The interstitial markings are coarse in both lower lung zones especially on the right but these are stable. The heart is normal in size. The pulmonary vascularity is  not engorged. A valve ring in the mitral position is demonstrated. There are 8 intact sternal wires. There is tortuosity of the descending thoracic aorta. There is no pleural effusion. There is multilevel degenerative disc disease of the thoracic spine.  IMPRESSION: COPD. Increased interstitial density predominantly in the upper lobes greater on the left than on the right consistent with interstitial pneumonia. No evidence of CHF. Followup radiographs following anticipated antibiotic therapy are recommended to assure complete clearing.   Electronically Signed   By: David  Martinique   On: 11/04/2014 12:54    Scheduled Meds: . azithromycin  500 mg Intravenous Q24H  . cefTRIAXone (ROCEPHIN)  IV  1 g Intravenous Q24H  . diltiazem  120 mg Oral Daily  . diltiazem  240 mg Oral Daily  . docusate sodium  100 mg Oral BID  . furosemide  40 mg Oral Daily  . guaiFENesin  1,200 mg Oral BID  . ipratropium-albuterol  3 mL Nebulization TID  . methylPREDNISolone (SOLU-MEDROL) injection  40 mg Intravenous Q12H  . pravastatin  40 mg Oral QPM  . salmeterol  1 puff Inhalation Q12H  . senna  1  tablet Oral BID  . sodium chloride  3 mL Intravenous Q12H  . sodium chloride  3 mL Intravenous Q12H  . warfarin  5 mg Oral ONCE-1800  . Warfarin - Pharmacist Dosing Inpatient   Does not apply q1800   Continuous Infusions:   Principal Problem:   Pneumonia Active Problems:   HYPERLIPIDEMIA, FAMILIAL   Essential hypertension, benign   COPD (chronic obstructive pulmonary disease) with emphysema   Atrial fibrillation   SOB (shortness of breath)   Community acquired pneumonia    Time spent: 40 minutes   [facility] Pager [phone]. If 7PM-7AM, please contact night-coverage at [email or web address], password [facility] 11/05/2014, 12:30 PM  LOS: 1 day

## 2014-11-05 NOTE — Progress Notes (Signed)
ANTICOAGULATION CONSULT NOTE - Follow-Up Consult  Pharmacy Consult for warfarin Indication: atrial fibrillation  No Known Allergies  Patient Measurements: Height: 5' 7.5" (171.5 cm) Weight: 197 lb 11.2 oz (89.676 kg) IBW/kg (Calculated) : 67.25 Vital Signs: Temp: 97.4 F (36.3 C) (02/17 0530) Temp Source: Oral (02/17 0530) BP: 140/75 mmHg (02/17 0802) Pulse Rate: 93 (02/17 0802)  Labs:  Recent Labs  11/04/14 1132 11/04/14 1345 11/05/14 0544  HGB 14.7  --  14.9  HCT 42.8  --  44.2  PLT 137*  --  153  LABPROT  --  22.1* 24.6*  INR  --  1.91* 2.20*  CREATININE 1.28  --  1.23    Estimated Creatinine Clearance: 55.1 mL/min (by C-G formula based on Cr of 1.23).   Medical History: Past Medical History  Diagnosis Date  . Mediastinal lymphadenopathy   . History of prostate cancer   . Atrial fibrillation   . COPD (chronic obstructive pulmonary disease)   . Adrenal adenoma   . Warthin's tumor   . Dyslipidemia   . Diverticulosis   . Embolic stroke     d/t endocarditis 2008  . Atrial flutter   . Ulcerative colitis     left-sided, associated with diverticulosis  . Obstructive chronic bronchitis   . HTN (hypertension)   . Thrombocytopenia   . Emphysema   . Right thyroid nodule   . Hemorrhoids   . TIA (transient ischemic attack) 03/25/2011    ? of due to transient diplopia  . CAD (coronary artery disease)     Lexiscan Myoview (01/2014): Lateral soft tissue attenuation, no ischemia, not gated, low risk    Assessment: 79 year old man on warfarin for AFIB - home dose is 5mg  daily except 7.5mg  on Tuesday.  INR 1.91 on admission.  Last dose taken was 11/03/14.  Today's INR therapeutic at 2.2.  Goal of Therapy:  INR 2-3  Plan:  Warfarin 5mg  x 1 dose today Daily protimes  Uvaldo Rising, BCPS  Clinical Pharmacist Pager [phone]  11/05/2014 10:47 AM

## 2014-11-05 NOTE — Evaluation (Addendum)
Occupational Therapy Evaluation Patient Details Name: [name] MRN: [id number] DOB: 1936-07-21 Today's Date: 11/05/2014    History of Present Illness Pt is a 79 y.o. male, with history of COPD, a-fib, ulcerative colitis, prior embolic stroke, and CAD. Pt presents with shortness of breath and productive cough x 4 days. Sputum is yellow with "bright red streaks of blood." Day before admission pt states he had chills all day and did not leave the bed. States he cannot walk further than across the hall for the past month without being short of breath.COPD diagnosed 3-4 years ago. Pt no longer smokes, but has a 180 pack year history. He started smoking at 79 years of age. He wears oxygen at home at night (2 L), but none during the day. Chest x-ray shows bilateral interstitial pneumonia in the upper lobes.   Clinical Impression   Pt is at sup level with ADLs and min guard A with transfers due to decreased balance. Pt ambulated to bathroom and back to recliner on RA 89-93%. All education completed and no further acute OT services indicated at this time. Pt to continue with acute PT services for functional mobility safety and energy conservation trg    Follow Up Recommendations  Supervision - Intermittent    Equipment Recommendations    none   Recommendations for Other Services        Precautions / Restrictions Precautions Precautions: Fall Restrictions Weight Bearing Restrictions: No      Mobility Bed Mobility               General bed mobility comments: Pt was sitting up in recliner  upon  arrival.   Transfers Overall transfer level: Needs assistance Equipment used: None Transfers: Sit to/from Stand Sit to Stand: Min guard         General transfer comment: Pt was able to power-up to full standing without assistance. Close guard for safety.    Balance   Sitting-balance support: No upper extremity supported;Feet supported Sitting balance-Leahy Scale: Good      Standing balance support: Single extremity supported;No upper extremity supported;During functional activity Standing balance-Leahy Scale: Fair                              ADL Overall ADL's : Needs assistance/impaired     Grooming: Wash/dry hands;Wash/dry face;Supervision/safety   Upper Body Bathing: Set up;Sitting   Lower Body Bathing: Supervison/ safety;Sit to/from stand   Upper Body Dressing : Set up;Sitting   Lower Body Dressing: Supervision/safety;Sit to/from stand   Toilet Transfer: Min guard;Regular Toilet;Grab bars   Toileting- Water quality scientist and Hygiene: Supervision/safety   Tub/ Banker: Min guard;Grab bars   Functional mobility during ADLs: Min guard       Vision  wears reading glasses   Perception Perception Perception Tested?: No   Praxis Praxis Praxis tested?: Not tested    Pertinent Vitals/Pain Pain Assessment: No/denies pain     Hand Dominance Right   Extremity/Trunk Assessment Upper Extremity Assessment Upper Extremity Assessment: Overall WFL for tasks assessed   Lower Extremity Assessment Lower Extremity Assessment: Defer to PT evaluation   Cervical / Trunk Assessment Cervical / Trunk Assessment: Normal   Communication Communication Communication: No difficulties   Cognition Arousal/Alertness: Awake/alert Behavior During Therapy: WFL for tasks assessed/performed Overall Cognitive Status: Within Functional Limits for tasks assessed  General Comments   Pt pleasant and cooperative                 Home Living Family/patient expects to be discharged to:: Private residence Living Arrangements: Spouse/significant other Available Help at Discharge: Family Type of Home: House Home Access: Stairs to enter Technical brewer of Steps: 1   Home Layout: Two level Alternate Level Stairs-Number of Steps: 14   Bathroom Shower/Tub: Tub/shower unit;Walk-in shower   Bathroom  Toilet: Handicapped height     Home Equipment: Cane - single point          Prior Functioning/Environment Level of Independence: Independent                             OT Goals(Current goals can be found in the care plan section) Acute Rehab OT Goals Patient Stated Goal: Return home with wife OT Goal Formulation: With patient/family  OT Frequency:  eval only   Barriers to D/C:  none                        End of Session    Activity Tolerance: Patient tolerated treatment well Patient left: in chair;with call bell/phone within reach;with family/visitor present;Other (comment) (physician in to see pt at end of session)   Time: 5498-2641 OT Time Calculation (min): 16 min Charges:  OT General Charges $OT Visit: 1 Procedure OT Evaluation $Initial OT Evaluation Tier I: 1 Procedure G-Codes:    Britt Bottom 11/05/2014, 1:26 PM

## 2014-11-06 ENCOUNTER — Telehealth: Payer: Self-pay | Admitting: Pulmonary Disease

## 2014-11-06 LAB — URINE MICROSCOPIC-ADD ON

## 2014-11-06 LAB — URINALYSIS, ROUTINE W REFLEX MICROSCOPIC
BILIRUBIN URINE: NEGATIVE
Glucose, UA: NEGATIVE mg/dL
KETONES UR: NEGATIVE mg/dL
LEUKOCYTES UA: NEGATIVE
Nitrite: NEGATIVE
PH: 5 (ref 5.0–8.0)
PROTEIN: NEGATIVE mg/dL
Specific Gravity, Urine: 1.017 (ref 1.005–1.030)
UROBILINOGEN UA: 0.2 mg/dL (ref 0.0–1.0)

## 2014-11-06 LAB — PROTIME-INR
INR: 3.17 — ABNORMAL HIGH (ref 0.00–1.49)
PROTHROMBIN TIME: 32.8 s — AB (ref 11.6–15.2)

## 2014-11-06 LAB — HIV ANTIBODY (ROUTINE TESTING W REFLEX): HIV Screen 4th Generation wRfx: NONREACTIVE

## 2014-11-06 LAB — STREP PNEUMONIAE URINARY ANTIGEN: Strep Pneumo Urinary Antigen: NEGATIVE

## 2014-11-06 MED ORDER — GUAIFENESIN ER 600 MG PO TB12: 1200.0000 mg | ORAL_TABLET | Freq: Two times a day (BID) | ORAL | Status: DC

## 2014-11-06 MED ORDER — LEVOFLOXACIN 500 MG PO TABS
500.0000 mg | ORAL_TABLET | Freq: Every day | ORAL | Status: DC
Start: 2014-11-06 — End: 2014-11-07

## 2014-11-06 MED ORDER — PREDNISONE 5 MG PO TABS: ORAL_TABLET | ORAL | Status: DC

## 2014-11-06 MED ORDER — ALBUTEROL SULFATE HFA 108 (90 BASE) MCG/ACT IN AERS: 2.0000 | INHALATION_SPRAY | Freq: Four times a day (QID) | RESPIRATORY_TRACT | Status: DC | PRN

## 2014-11-06 NOTE — Progress Notes (Signed)
CARE MANAGEMENT NOTE 11/06/2014  Patient:  ZAKIR, HENNER   Account Number:  [id number]  Date Initiated:  11/06/2014  Documentation initiated by:  [facility]  Subjective/Objective Assessment:   Pneumonia     Action/Plan:   Anticipated DC Date:  11/06/2014   Anticipated DC Plan:  Meta  CM consult      [facility] Choice  HOME HEALTH   Choice offered to / List presented to:  C-1 Patient        [employer] arranged  HH-1 RN      [place].   Status of service:  Completed, signed off Medicare Important Message given?  NA - LOS <3 / Initial given by admissions (If response is "NO", the following Medicare IM given date fields will be blank) Date Medicare IM given:   Medicare IM given by:   Date Additional Medicare IM given:   Additional Medicare IM given by:    Discharge Disposition:  [place]  Per UR Regulation:    If discussed at Long Length of Stay Meetings, dates discussed:    Comments:  11/06/2014 1300 NCM spoke to pt and gave permission to speak to son. Offered choice for [facility]. Pt states he just wants [facility] RN. He is independent with ambulation. He has oxygen at home. Contacted [facility] for portable oxygen for home. Requested rep to review orders and set up at home. Pt states he is only wearing oxygen at night. Pt will need cont oxygen at home. Jonnie Finner RN CCM Case Mgmt phone [phone]

## 2014-11-06 NOTE — Progress Notes (Signed)
SATURATION QUALIFICATIONS: (This note is used to comply with regulatory documentation for home oxygen)  Patient Saturations on Room Air at Rest = 94%  Patient Saturations on Room Air while Ambulating = 84%  Patient Saturations on 3 Liters of oxygen while Ambulating = 94%  Please briefly explain why patient needs home oxygen: 

## 2014-11-06 NOTE — Discharge Instructions (Signed)
hold Coumadin today and restart on 2/19 Patient needs an INR check on 2/22 at PCP office

## 2014-11-06 NOTE — Telephone Encounter (Signed)
Spoke with pt's son.  He states that pt is being discharged from hospital today and wants to f/u with [facility] to discuss all the new orders (oxygen etc).  Appt given with TP 11/07/14 at 2:00.

## 2014-11-06 NOTE — Discharge Summary (Signed)
Physician Discharge Summary  [name] MRN: [id number] DOB/AGE: 12-18-1935 79 y.o.  PCP: Unice Cobble, MD   Admit date: 11/04/2014 Discharge date: 11/06/2014  Discharge Diagnoses:   Principal Problem:   Pneumonia Active Problems:   HYPERLIPIDEMIA, FAMILIAL   Essential hypertension, benign   COPD (chronic obstructive pulmonary disease) with emphysema   Atrial fibrillation   SOB (shortness of breath)   Community acquired pneumonia   Follow-up with PCP in 5-7 days Next INR check on 2/22 CBC, CMP in one week     Medication List    STOP taking these medications        AMBULATORY NON FORMULARY MEDICATION     diltiazem 120 MG 24 hr capsule  Commonly known as:  TIAZAC      TAKE these medications        albuterol 108 (90 BASE) MCG/ACT inhaler  Commonly known as:  PROVENTIL HFA;VENTOLIN HFA  Inhale 2 puffs into the lungs every 6 (six) hours as needed for wheezing or shortness of breath.     ANORO ELLIPTA 62.5-25 MCG/INH Aepb  Generic drug:  Umeclidinium-Vilanterol  Inhale 1 puff into the lungs daily.     clonazePAM 0.5 MG tablet  Commonly known as:  KLONOPIN  Take 1 tablet (0.5 mg total) by mouth every 12 (twelve) hours as needed.     diltiazem 240 MG 24 hr capsule  Commonly known as:  CARDIZEM CD  Take 1 capsule (240 mg total) by mouth daily.     formoterol 12 MCG capsule for inhaler  Commonly known as:  FORADIL  Place 1 capsule (12 mcg total) into inhaler and inhale 2 (two) times daily.     furosemide 40 MG tablet  Commonly known as:  LASIX  Take 1 tablet (40 mg total) by mouth daily as needed.     guaiFENesin 600 MG 12 hr tablet  Commonly known as:  MUCINEX  Take 2 tablets (1,200 mg total) by mouth 2 (two) times daily.     levofloxacin 500 MG tablet  Commonly known as:  LEVAQUIN  Take 1 tablet (500 mg total) by mouth daily.     Olodaterol HCl 2.5 MCG/ACT Aers  Commonly known as:  STRIVERDI RESPIMAT  Inhale 2 puffs into the lungs daily.      pravastatin 40 MG tablet  Commonly known as:  PRAVACHOL  TAKE 1 TABLET EVERY EVENING     predniSONE 5 MG tablet  Commonly known as:  DELTASONE  - 8 tablet for 2 days  - 7 tablets for 2 days  - 6 tablets for 2 days  - 5 tablets for 2 days  - 4 tablets for 2 days  - 3 tablets for 2 days  - 2 tablets for 2 days  - 1 tablet for 2 days then discontinue     sulfaSALAzine 500 MG tablet  Commonly known as:  AZULFIDINE  Take 1 tablet (500 mg total) by mouth 2 (two) times daily.     traMADol 50 MG tablet  Commonly known as:  ULTRAM  Take 1 tablet (50 mg total) by mouth every 8 (eight) hours as needed.     warfarin 5 MG tablet  Commonly known as:  COUMADIN  Take 5-7.5 mg by mouth daily. $RemoveBefo'5mg'[id number]$  daily and 7.$RemoveBef'5mg'vKfNuKoyTw$  on Tuesdays        Discharge Condition: Stable   Disposition: Home health physical therapy   Consults: None   Significant Diagnostic Studies: Dg Chest 2 View  11/04/2014  CLINICAL DATA:  Four days of shortness of breath, occasional hemoptysis, history of asthma and COPD and coronary artery disease ; previous heavy smoking history.  EXAM: CHEST  2 VIEW  COMPARISON:  PA and lateral chest x-ray of September 26, 2014  FINDINGS: The lungs are well-expanded. The interstitial markings are increased with areas of subtle confluence in the left upper lobe. The interstitial markings are coarse in both lower lung zones especially on the right but these are stable. The heart is normal in size. The pulmonary vascularity is not engorged. A valve ring in the mitral position is demonstrated. There are 8 intact sternal wires. There is tortuosity of the descending thoracic aorta. There is no pleural effusion. There is multilevel degenerative disc disease of the thoracic spine.  IMPRESSION: COPD. Increased interstitial density predominantly in the upper lobes greater on the left than on the right consistent with interstitial pneumonia. No evidence of CHF. Followup radiographs following  anticipated antibiotic therapy are recommended to assure complete clearing.   Electronically Signed   By: David  Martinique   On: 11/04/2014 12:54      Microbiology: Recent Results (from the past 240 hour(s))  Culture, blood (routine x 2)     Status: None (Preliminary result)   Collection Time: 11/04/14  1:45 PM  Result Value Ref Range Status   Specimen Description BLOOD RIGHT FOREARM  Final   Special Requests BOTTLES DRAWN AEROBIC AND ANAEROBIC 5CC  Final   Culture   Final           BLOOD CULTURE RECEIVED NO GROWTH TO DATE CULTURE WILL BE HELD FOR 5 DAYS BEFORE ISSUING A FINAL NEGATIVE REPORT Performed at [facility]    Report Status PENDING  Incomplete  Culture, blood (routine x 2)     Status: None (Preliminary result)   Collection Time: 11/04/14  2:00 PM  Result Value Ref Range Status   Specimen Description BLOOD ARM LEFT  Final   Special Requests BOTTLES DRAWN AEROBIC AND ANAEROBIC 10CC  Final   Culture   Final           BLOOD CULTURE RECEIVED NO GROWTH TO DATE CULTURE WILL BE HELD FOR 5 DAYS BEFORE ISSUING A FINAL NEGATIVE REPORT Performed at [facility]    Report Status PENDING  Incomplete  Culture, sputum-assessment     Status: None   Collection Time: 11/04/14  8:46 PM  Result Value Ref Range Status   Specimen Description SPUTUM  Final   Special Requests Immunocompromised  Final   Sputum evaluation   Final    MICROSCOPIC FINDINGS SUGGEST THAT THIS SPECIMEN IS NOT REPRESENTATIVE OF LOWER RESPIRATORY SECRETIONS. PLEASE RECOLLECT. Results Called to: Zenon Mayo, RN [phone] [place]    Report Status 11/04/2014 FINAL  Final     Labs: Results for orders placed or performed during the hospital encounter of 11/04/14 (from the past 48 hour(s))  Culture, blood (routine x 2)     Status: None (Preliminary result)   Collection Time: 11/04/14  1:45 PM  Result Value Ref Range   Specimen Description BLOOD RIGHT FOREARM    Special Requests BOTTLES DRAWN AEROBIC AND  ANAEROBIC 5CC    Culture             BLOOD CULTURE RECEIVED NO GROWTH TO DATE CULTURE WILL BE HELD FOR 5 DAYS BEFORE ISSUING A FINAL NEGATIVE REPORT Performed at [facility]    Report Status PENDING   Protime-INR     Status: Abnormal  Collection Time: 11/04/14  1:45 PM  Result Value Ref Range   Prothrombin Time 22.1 (H) 11.6 - 15.2 seconds   INR 1.91 (H) 0.00 - 1.49  Culture, blood (routine x 2)     Status: None (Preliminary result)   Collection Time: 11/04/14  2:00 PM  Result Value Ref Range   Specimen Description BLOOD ARM LEFT    Special Requests BOTTLES DRAWN AEROBIC AND ANAEROBIC 10CC    Culture             BLOOD CULTURE RECEIVED NO GROWTH TO DATE CULTURE WILL BE HELD FOR 5 DAYS BEFORE ISSUING A FINAL NEGATIVE REPORT Performed at [facility]    Report Status PENDING   HIV antibody     Status: None   Collection Time: 11/04/14  7:00 PM  Result Value Ref Range   HIV Screen 4th Generation wRfx Non Reactive Non Reactive    Comment: (NOTE) Performed At: [facility] [address] [place], [place] [id number] Lindon Romp MD WS:[id number]   Culture, sputum-assessment     Status: None   Collection Time: 11/04/14  8:46 PM  Result Value Ref Range   Specimen Description SPUTUM    Special Requests Immunocompromised    Sputum evaluation      MICROSCOPIC FINDINGS SUGGEST THAT THIS SPECIMEN IS NOT REPRESENTATIVE OF LOWER RESPIRATORY SECRETIONS. PLEASE RECOLLECT. Results Called to: Zenon Mayo, RN [phone] [place]    Report Status 11/04/2014 FINAL   Influenza panel by pcr     Status: None   Collection Time: 11/04/14  8:47 PM  Result Value Ref Range   Influenza A By PCR NEGATIVE NEGATIVE   Influenza B By PCR NEGATIVE NEGATIVE   H1N1 flu by pcr NOT DETECTED NOT DETECTED    Comment:        The Xpert Flu assay (FDA approved for nasal aspirates or washes and nasopharyngeal swab specimens), is intended as an aid in the diagnosis of influenza and  should not be used as a sole basis for treatment.   Basic metabolic panel     Status: Abnormal   Collection Time: 11/05/14  5:44 AM  Result Value Ref Range   Sodium 134 (L) 135 - 145 mmol/L   Potassium 4.0 3.5 - 5.1 mmol/L   Chloride 104 96 - 112 mmol/L   CO2 19 19 - 32 mmol/L   Glucose, Bld 176 (H) 70 - 99 mg/dL   BUN 17 6 - 23 mg/dL   Creatinine, Ser 1.23 0.50 - 1.35 mg/dL   Calcium 9.3 8.4 - 10.5 mg/dL   GFR calc non Af Amer 55 (L) >90 mL/min   GFR calc Af Amer 64 (L) >90 mL/min    Comment: (NOTE) The eGFR has been calculated using the CKD EPI equation. This calculation has not been validated in all clinical situations. eGFR's persistently <90 mL/min signify possible Chronic Kidney Disease.    Anion gap 11 5 - 15  CBC     Status: None   Collection Time: 11/05/14  5:44 AM  Result Value Ref Range   WBC 5.3 4.0 - 10.5 K/uL   RBC 4.91 4.22 - 5.81 MIL/uL   Hemoglobin 14.9 13.0 - 17.0 g/dL   HCT 44.2 39.0 - 52.0 %   MCV 90.0 78.0 - 100.0 fL   MCH 30.3 26.0 - 34.0 pg   MCHC 33.7 30.0 - 36.0 g/dL   RDW 13.2 11.5 - 15.5 %   Platelets 153 150 - 400 K/uL  Protime-INR     Status: Abnormal   Collection Time: 11/05/14  5:44 AM  Result Value Ref Range   Prothrombin Time 24.6 (H) 11.6 - 15.2 seconds   INR 2.20 (H) 0.00 - 1.49  Urinalysis, Routine w reflex microscopic     Status: Abnormal   Collection Time: 11/05/14 11:30 AM  Result Value Ref Range   Color, Urine YELLOW YELLOW   APPearance CLEAR CLEAR   Specific Gravity, Urine 1.020 1.005 - 1.030   pH 5.0 5.0 - 8.0   Glucose, UA 500 (A) NEGATIVE mg/dL   Hgb urine dipstick TRACE (A) NEGATIVE   Bilirubin Urine NEGATIVE NEGATIVE   Ketones, ur NEGATIVE NEGATIVE mg/dL   Protein, ur 30 (A) NEGATIVE mg/dL   Urobilinogen, UA 0.2 0.0 - 1.0 mg/dL   Nitrite NEGATIVE NEGATIVE   Leukocytes, UA NEGATIVE NEGATIVE  Urine microscopic-add on     Status: None   Collection Time: 11/05/14 11:30 AM  Result Value Ref Range   Squamous Epithelial  / LPF RARE RARE   WBC, UA 0-2 <3 WBC/hpf   RBC / HPF 3-6 <3 RBC/hpf   Bacteria, UA RARE RARE  Protime-INR     Status: Abnormal   Collection Time: 11/06/14  7:50 AM  Result Value Ref Range   Prothrombin Time 32.8 (H) 11.6 - 15.2 seconds   INR 3.17 (H) 0.00 - 1.49  Strep pneumoniae urinary antigen     Status: None   Collection Time: 11/06/14  8:51 AM  Result Value Ref Range   Strep Pneumo Urinary Antigen NEGATIVE NEGATIVE    Comment:        Infection due to S. pneumoniae cannot be absolutely ruled out since the antigen present may be below the detection limit of the test.   Urinalysis, Routine w reflex microscopic     Status: Abnormal   Collection Time: 11/06/14  8:51 AM  Result Value Ref Range   Color, Urine YELLOW YELLOW   APPearance CLEAR CLEAR   Specific Gravity, Urine 1.017 1.005 - 1.030   pH 5.0 5.0 - 8.0   Glucose, UA NEGATIVE NEGATIVE mg/dL   Hgb urine dipstick SMALL (A) NEGATIVE   Bilirubin Urine NEGATIVE NEGATIVE   Ketones, ur NEGATIVE NEGATIVE mg/dL   Protein, ur NEGATIVE NEGATIVE mg/dL   Urobilinogen, UA 0.2 0.0 - 1.0 mg/dL   Nitrite NEGATIVE NEGATIVE   Leukocytes, UA NEGATIVE NEGATIVE  Urine microscopic-add on     Status: None   Collection Time: 11/06/14  8:51 AM  Result Value Ref Range   Squamous Epithelial / LPF RARE RARE   WBC, UA 0-2 <3 WBC/hpf   RBC / HPF 0-2 <3 RBC/hpf   Bacteria, UA RARE RARE     HPI Brief narrative: [name] is a 79 y.o. male, with history of COPD, atrial fibrillation, ulcerative colitis, prior embolic stroke, and coronary artery disease. Pt presents with shortness of breath and productive cough x 4 days. Sputum is yellow with "bright red streaks of blood." Yesterday pt states he had chills all day and did not leave the bed. States he cannot walk further than across the hall for the past month without being short of breath. COPD diagnosed 3-4 years ago. Pt no longer smokes, but has a 180 pack year history. He started smoking at 79  years of age. In ED, dyspnea present while conversing. He wears oxygen at home at night (2 L), but none during the day. Denies chills or current cough. Reports he was unable to  move bowels for 4 days. Last bowel movement was today. States abdomen is no more distended than normal, but still "feels full" and has the need to have another bowel movement.  Chest x-ray shows bilateral interstitial pneumonia in the upper lobes. O2 saturations are 88% on room air. Serologies are unimpressive showing only thrombocytopenia (137), and a subtherapeutic INR (1.91):    HOSPITAL COURSE:  Community Acquired Pneumonia-improving Initiated on Ceftriaxone 1g IV and Azithromycin 500mg  IV 2 days Switched to levofloxacin for another 5 days Continue Mucinex BID. Sputum culture, gram stain, HIV antibody, influenza panel negative, legionella antigen pending and strep pneumo urinary antigen negative     COPD exacerbation Initially started on Solu-Medrol 40 twice a day, Duoneb nebulizer, switch to prednisone taper Continue with inhalers and  LABA inhalers.serevent Patient being discharged home with 3 L of oxygen continuous  Atrial Fibrillation History of unsuccessful cardioversion. Continue Cardizem.  Continue wafarin per pharmacy, INR 3.17 We'll hold Coumadin today and restart on 2/19 Patient needs an INR check on 2/22 at PCP office   Hyperlipidemia Pravastatin 40mg  continued Low sodium heart diet  Hypertension Continue Dilitiazem 24 hour capsule 240mg  Daily, and lasix 40mg  daily.   Difficulty with urination States he has difficulty with flow. He is status post prostatectomy. UA negative  History of ulcerative colitis Continue sulfasalazine. No acute issues.    Discharge Exam:  Blood pressure 136/66, pulse 77, temperature 97.4 F (36.3 C), temperature source Oral, resp. rate 18, height 5' 7.5" (1.715 m), weight 89.676 kg (197 lb 11.2 oz), SpO2 94 %.  General: No acute respiratory  distress Lungs: Clear to auscultation bilaterally without wheezes or crackles Cardiovascular: Regular rate and rhythm without murmur gallop or rub normal S1 and S2 Abdomen: Nontender, nondistended, soft, bowel sounds positive, no rebound, no ascites, no appreciable mass Extremities: No significant cyanosis, clubbing, or edema bilateral lower extremities        Discharge Instructions    Diet - low sodium heart healthy    Complete by:  As directed      Increase activity slowly    Complete by:  As directed            Follow-up Information    Follow up with Unice Cobble, MD. Call in 3 days.   Specialty:  Internal Medicine   Why:  INR check 2/22   Contact information:   [address] [address] [phone]       Signed: Reyne Dumas 11/06/2014, 12:23 PM

## 2014-11-07 ENCOUNTER — Telehealth: Payer: Self-pay | Admitting: *Deleted

## 2014-11-07 ENCOUNTER — Ambulatory Visit (INDEPENDENT_AMBULATORY_CARE_PROVIDER_SITE_OTHER): Payer: Medicare Other | Admitting: Adult Health

## 2014-11-07 ENCOUNTER — Encounter: Payer: Self-pay | Admitting: Adult Health

## 2014-11-07 ENCOUNTER — Ambulatory Visit (INDEPENDENT_AMBULATORY_CARE_PROVIDER_SITE_OTHER)
Admission: RE | Admit: 2014-11-07 | Discharge: 2014-11-07 | Disposition: A | Discharge status: Home / Self Care | Payer: Medicare Other | Source: Ambulatory Visit | Attending: Adult Health | Admitting: Adult Health

## 2014-11-07 VITALS — BP 130/74 | HR 83 | Temp 97.6°F | Ht 67.5 in | Wt 195.6 lb

## 2014-11-07 DIAGNOSIS — E780 Pure hypercholesterolemia: Secondary | ICD-10-CM | POA: Diagnosis not present

## 2014-11-07 DIAGNOSIS — Z8719 Personal history of other diseases of the digestive system: Secondary | ICD-10-CM | POA: Diagnosis not present

## 2014-11-07 DIAGNOSIS — I517 Cardiomegaly: Secondary | ICD-10-CM | POA: Diagnosis not present

## 2014-11-07 DIAGNOSIS — I10 Essential (primary) hypertension: Secondary | ICD-10-CM | POA: Diagnosis not present

## 2014-11-07 DIAGNOSIS — Z9079 Acquired absence of other genital organ(s): Secondary | ICD-10-CM | POA: Diagnosis not present

## 2014-11-07 DIAGNOSIS — Z7901 Long term (current) use of anticoagulants: Secondary | ICD-10-CM | POA: Diagnosis not present

## 2014-11-07 DIAGNOSIS — I2510 Atherosclerotic heart disease of native coronary artery without angina pectoris: Secondary | ICD-10-CM | POA: Diagnosis not present

## 2014-11-07 DIAGNOSIS — I4891 Unspecified atrial fibrillation: Secondary | ICD-10-CM | POA: Diagnosis not present

## 2014-11-07 DIAGNOSIS — Z8673 Personal history of transient ischemic attack (TIA), and cerebral infarction without residual deficits: Secondary | ICD-10-CM | POA: Diagnosis not present

## 2014-11-07 DIAGNOSIS — Z87891 Personal history of nicotine dependence: Secondary | ICD-10-CM | POA: Diagnosis not present

## 2014-11-07 DIAGNOSIS — Z9981 Dependence on supplemental oxygen: Secondary | ICD-10-CM | POA: Diagnosis not present

## 2014-11-07 DIAGNOSIS — I700 Atherosclerosis of aorta: Secondary | ICD-10-CM | POA: Diagnosis not present

## 2014-11-07 DIAGNOSIS — J189 Pneumonia, unspecified organism: Secondary | ICD-10-CM

## 2014-11-07 DIAGNOSIS — J441 Chronic obstructive pulmonary disease with (acute) exacerbation: Secondary | ICD-10-CM | POA: Diagnosis not present

## 2014-11-07 DIAGNOSIS — J440 Chronic obstructive pulmonary disease with acute lower respiratory infection: Secondary | ICD-10-CM | POA: Diagnosis not present

## 2014-11-07 DIAGNOSIS — J438 Other emphysema: Secondary | ICD-10-CM | POA: Diagnosis not present

## 2014-11-07 DIAGNOSIS — R05 Cough: Secondary | ICD-10-CM | POA: Diagnosis not present

## 2014-11-07 DIAGNOSIS — R3919 Other difficulties with micturition: Secondary | ICD-10-CM | POA: Diagnosis not present

## 2014-11-07 LAB — URINE CULTURE: Colony Count: 6000

## 2014-11-07 LAB — LEGIONELLA ANTIGEN, URINE

## 2014-11-07 MED ORDER — ALBUTEROL SULFATE (2.5 MG/3ML) 0.083% IN NEBU: 2.5000 mg | INHALATION_SOLUTION | RESPIRATORY_TRACT | Status: DC | PRN

## 2014-11-07 MED ORDER — LEVOFLOXACIN 500 MG PO TABS: 500.0000 mg | ORAL_TABLET | Freq: Every day | ORAL | Status: DC

## 2014-11-07 MED ORDER — UMECLIDINIUM-VILANTEROL 62.5-25 MCG/INH IN AEPB: 1.0000 | INHALATION_SPRAY | Freq: Every day | RESPIRATORY_TRACT | Status: DC

## 2014-11-07 NOTE — Patient Instructions (Signed)
Finish Levaquin and Prednisone .  Can try Anoro 1 puff daily  Follow up Dr. Gwenette Greet in 4 weeks and As needed

## 2014-11-07 NOTE — Telephone Encounter (Signed)
Transition Care Management Follow-up Telephone Call D/C 11/06/14  How have you been since you were released from the hospital? Called pt spoke with wife she stated husband is doing ok   Do you understand why you were in the hospital? YES   Do you understand the discharge instrcutions? YES  Items Reviewed:  Medications reviewed: YES  Allergies reviewed: YES  Dietary changes reviewed: NO  Referrals reviewed: no referrals needed   Functional Questionnaire:   Activities of Daily Living (ADLs):   She states he are independent in the following: bathing and hygiene, feeding, continence, grooming, toileting and dressing She states he doesn't require assistance   Any transportation issues/concerns?: NO   Any patient concerns? No   Confirmed importance and date/time of follow-up visits scheduled: YES, wife states they made hosp f/u with Nicky Pugh @ [facility] for 11/11/14. She states since she was the one who admitted him, and since Dr. Linna Darner will be retiring soon they are going to see about changing over to [facility]. Advise wife to make sure he keep appt they are needing to f/u on labs   Confirmed with patient if condition begins to worsen call PCP or go to the ER.

## 2014-11-07 NOTE — Progress Notes (Signed)
Patient discharge teaching given, including activity, diet, follow-up appoints, and medications. Patient verbalized understanding of all discharge instructions. IV access was d/c'd. Vitals are stable. Skin is intact except as charted in most recent assessments. Pt to be escorted out by NT, to be driven home by family.  [name], MBA, BS, RN 

## 2014-11-08 DIAGNOSIS — J441 Chronic obstructive pulmonary disease with (acute) exacerbation: Secondary | ICD-10-CM | POA: Diagnosis not present

## 2014-11-08 DIAGNOSIS — I4891 Unspecified atrial fibrillation: Secondary | ICD-10-CM | POA: Diagnosis not present

## 2014-11-08 DIAGNOSIS — J440 Chronic obstructive pulmonary disease with acute lower respiratory infection: Secondary | ICD-10-CM | POA: Diagnosis not present

## 2014-11-08 DIAGNOSIS — I10 Essential (primary) hypertension: Secondary | ICD-10-CM | POA: Diagnosis not present

## 2014-11-08 DIAGNOSIS — I2510 Atherosclerotic heart disease of native coronary artery without angina pectoris: Secondary | ICD-10-CM | POA: Diagnosis not present

## 2014-11-08 DIAGNOSIS — J189 Pneumonia, unspecified organism: Secondary | ICD-10-CM | POA: Diagnosis not present

## 2014-11-10 ENCOUNTER — Ambulatory Visit (INDEPENDENT_AMBULATORY_CARE_PROVIDER_SITE_OTHER): Payer: Medicare Other | Admitting: Interventional Cardiology

## 2014-11-10 DIAGNOSIS — J189 Pneumonia, unspecified organism: Secondary | ICD-10-CM | POA: Diagnosis not present

## 2014-11-10 DIAGNOSIS — Z5181 Encounter for therapeutic drug level monitoring: Secondary | ICD-10-CM

## 2014-11-10 DIAGNOSIS — J440 Chronic obstructive pulmonary disease with acute lower respiratory infection: Secondary | ICD-10-CM | POA: Diagnosis not present

## 2014-11-10 DIAGNOSIS — I10 Essential (primary) hypertension: Secondary | ICD-10-CM | POA: Diagnosis not present

## 2014-11-10 DIAGNOSIS — J441 Chronic obstructive pulmonary disease with (acute) exacerbation: Secondary | ICD-10-CM | POA: Diagnosis not present

## 2014-11-10 DIAGNOSIS — I4892 Unspecified atrial flutter: Secondary | ICD-10-CM

## 2014-11-10 DIAGNOSIS — I2510 Atherosclerotic heart disease of native coronary artery without angina pectoris: Secondary | ICD-10-CM | POA: Diagnosis not present

## 2014-11-10 DIAGNOSIS — I4891 Unspecified atrial fibrillation: Secondary | ICD-10-CM | POA: Diagnosis not present

## 2014-11-10 LAB — CULTURE, BLOOD (ROUTINE X 2)
Culture: NO GROWTH
Culture: NO GROWTH

## 2014-11-10 LAB — POCT INR: INR: 2.7

## 2014-11-11 ENCOUNTER — Encounter: Payer: Self-pay | Admitting: Nurse Practitioner

## 2014-11-11 ENCOUNTER — Ambulatory Visit (INDEPENDENT_AMBULATORY_CARE_PROVIDER_SITE_OTHER): Payer: Medicare Other | Admitting: Nurse Practitioner

## 2014-11-11 VITALS — BP 110/50 | HR 69 | Temp 98.0°F | Ht 68.5 in | Wt 194.0 lb

## 2014-11-11 DIAGNOSIS — I10 Essential (primary) hypertension: Secondary | ICD-10-CM | POA: Diagnosis not present

## 2014-11-11 DIAGNOSIS — I2510 Atherosclerotic heart disease of native coronary artery without angina pectoris: Secondary | ICD-10-CM | POA: Diagnosis not present

## 2014-11-11 DIAGNOSIS — J189 Pneumonia, unspecified organism: Secondary | ICD-10-CM

## 2014-11-11 DIAGNOSIS — R4586 Emotional lability: Secondary | ICD-10-CM | POA: Diagnosis not present

## 2014-11-11 DIAGNOSIS — I4891 Unspecified atrial fibrillation: Secondary | ICD-10-CM

## 2014-11-11 DIAGNOSIS — J441 Chronic obstructive pulmonary disease with (acute) exacerbation: Secondary | ICD-10-CM | POA: Diagnosis not present

## 2014-11-11 DIAGNOSIS — J440 Chronic obstructive pulmonary disease with acute lower respiratory infection: Secondary | ICD-10-CM | POA: Diagnosis not present

## 2014-11-11 LAB — COMPREHENSIVE METABOLIC PANEL
ALT: 22 U/L (ref 0–53)
AST: 13 U/L (ref 0–37)
Albumin: 3.7 g/dL (ref 3.5–5.2)
Alkaline Phosphatase: 74 U/L (ref 39–117)
BILIRUBIN TOTAL: 0.7 mg/dL (ref 0.2–1.2)
BUN: 26 mg/dL — ABNORMAL HIGH (ref 6–23)
CHLORIDE: 98 meq/L (ref 96–112)
CO2: 34 meq/L — AB (ref 19–32)
Calcium: 9.4 mg/dL (ref 8.4–10.5)
Creatinine, Ser: 1.4 mg/dL (ref 0.40–1.50)
GFR: 52.24 mL/min — ABNORMAL LOW (ref >60.00)
Glucose, Bld: 160 mg/dL — ABNORMAL HIGH (ref 70–99)
Potassium: 3.6 mEq/L (ref 3.5–5.1)
SODIUM: 137 meq/L (ref 135–145)
Total Protein: 6.7 g/dL (ref 6.0–8.3)

## 2014-11-11 LAB — CBC
HEMATOCRIT: 48.6 % (ref 39.0–52.0)
Hemoglobin: 16.3 g/dL (ref 13.0–17.0)
MCHC: 33.5 g/dL (ref 30.0–36.0)
MCV: 89.3 fl (ref 78.0–100.0)
Platelets: 238 10*3/uL (ref 150.0–400.0)
RBC: 5.44 Mil/uL (ref 4.22–5.81)
RDW: 13.8 % (ref 11.5–15.5)
WBC: 15.6 10*3/uL — ABNORMAL HIGH (ref 4.0–10.5)

## 2014-11-11 MED ORDER — FLUOXETINE HCL 20 MG PO TABS: 20.0000 mg | ORAL_TABLET | Freq: Every day | ORAL | Status: DC

## 2014-11-11 NOTE — Patient Instructions (Signed)
Stop all inhalers except anoro-1 puff once daily and albuterol nebulizer every 4 hours when needed for shortness of breath or wheezing.  Finish levaquin and prednisone. Continue cardizem, pravastatin, lasix, sulfasalzine, guaifenesen, and coumadin.  Start fluoxetine for depression at bedtime. See me in 5 weeks.  Stop klonopin as it can make you drowsy, putting you at risk for falls.  Use tramadol if needed for back pain, caution as it will make you drowsy.

## 2014-11-11 NOTE — Progress Notes (Signed)
Subjective:     [name] is a 79 y.o. male and is here for transitional care management services following hospital admission (2/16-2/18) for bilat upper lobe pneumonia and asks for "depression medicine". He is accompanied buy his wife & son today. He lives with his wife. He is full code. Mr Agustin is transferring care from Dr Linna Darner to me for convenience of location. His active problems include HYPERLIPIDEMIA, FAMILIAL; Essential hypertension, benign; COPD (chronic obstructive pulmonary disease) with emphysema followed by Dr Gwenette Greet; Atrial fibrillation followed by Dr Stanford Breed; resolving Community acquired pneumonia. He has a history of UC followed by Dr Carlean Purl. Last OV was 04/2014, reviewed note. Pt was to continue sulfasalazine & f/u in 1 yr. Pt states he has not taken this med in " a long time". He reports no UC flares. Pt had f/u OV w/Ms Parrett PA (Pulm) 11/07/14. Note reviewed; Pt is to continue levaquin, prednisone, continuous home O2, Anoro neb 1 puff qd, & alb neb q4h PRN, and f/u w/ Dr Gwenette Greet in 4 weeks. I went over these instructions w/pt, his son & wife, as they were not clear about what nebs to use. Hospital note reviewed: Dx- bilat upper lobe pneumonia. TX: levaquin, prednisone, amoro. W/u: Neg for legionella & urinary strep antigen. Blood cultures neg. sputum sample needed recollect. urine culture-insignificant growth. HIV NR. WBC nml. platelets slightly low. INR not at goal on admission, at goal at d/c. INR was checked yesterday by home health RN & called to cardiology-pt understands how to take coumadin. D/C orders include increasing home O2 to continuous from pm only, PT, OT, Home health. Last ECG in hospital showed a-fib. Pulse is irreg today. He is asymptomatic. He is taking 240 mg cardizem qd & 120 mg qd. Has appt w/Dr Stanford Breed next week. Pt states PT & OT & home health nurse are coming to home.  History   Social History  . Marital Status: Married    Spouse Name: Jeanett Schlein  .  Number of Children: 2  . Years of Education: N/A   Occupational History  . RETIRED     [employer]   .     Social History Main Topics  . Smoking status: Former Smoker -- 3.00 packs/day for 60 years    Types: Cigarettes    Quit date: 09/19/2002  . Smokeless tobacco: Never Used  . Alcohol Use: No  . Drug Use: No  . Sexual Activity: Not on file   Other Topics Concern  . Not on file   Social History Narrative   Pt has children   Health Maintenance  Topic Date Due  . Samul Dada  12/11/1956  . ZOSTAVAX  12/11/1997  . INFLUENZA VACCINE  04/20/2015  . COLONOSCOPY  12/18/2017  . PNEUMOCOCCAL POLYSACCHARIDE VACCINE AGE 45 AND OVER  Completed    The following portions of the patient's history were reviewed and updated as appropriate: allergies, current medications, past medical history, past social history, past surgical history and problem list.  Review of Systems Respiratory: negative for cough, wheezing and SOB Cardiovascular: negative for lower extremity edema Behavioral/Psych: positive for pt reports he cries easily & is embarrassed by this. His son says it started after had stents placed.   Objective:    BP 110/50 mmHg  Pulse 69  Temp(Src) 98 F (36.7 C) (Oral)  Ht 5' 8.5" (1.74 m)  Wt 194 lb (87.998 kg)  BMI 29.07 kg/m2  SpO2 95% General appearance: alert, cooperative, appears stated age and no distress Head: Normocephalic,  without obvious abnormality, atraumatic Eyes: negative findings: lids and lashes normal and conjunctivae and sclerae normal Lungs: clear to auscultation bilaterally Heart: irregularly irregular rhythm Extremities: extremities normal, atraumatic, no cyanosis or edema   Behavior: he was suddenly overcome with emotional at last OV when I explained that he would need to go to ER for further eval. He quickly gained composure. Today, he is smiling & conversive, but expresses frustration over crying easily, including when he watches TV.      Assessment:Plan  1. CAP (community acquired pneumonia), resolving Finish levaquin & prednisone Continue anoro qd & alb neb q4h prn Stop olodaterol & formeterol. - Comprehensive metabolic panel - CBC cxr at return ov if not performed by pulm.  2. Atrial fibrillation,chronic See cardiology next week Continue cardizem  3. Emotional lability, new - FLUoxetine (PROZAC) 20 MG tablet; Take 1 tablet (20 mg total) by mouth at bedtime.  Dispense: 30 tablet; Refill: 1 Stop Klonopin. (Pt states he only takes it about 2/mo.) F/u 4-6 weeks

## 2014-11-11 NOTE — Progress Notes (Signed)
Pre visit review using our clinic review tool, if applicable. No additional management support is needed unless otherwise documented below in the visit note. 

## 2014-11-12 ENCOUNTER — Telehealth: Payer: Self-pay | Admitting: Nurse Practitioner

## 2014-11-12 DIAGNOSIS — I4891 Unspecified atrial fibrillation: Secondary | ICD-10-CM | POA: Diagnosis not present

## 2014-11-12 DIAGNOSIS — J441 Chronic obstructive pulmonary disease with (acute) exacerbation: Secondary | ICD-10-CM | POA: Diagnosis not present

## 2014-11-12 DIAGNOSIS — I10 Essential (primary) hypertension: Secondary | ICD-10-CM | POA: Diagnosis not present

## 2014-11-12 DIAGNOSIS — I2510 Atherosclerotic heart disease of native coronary artery without angina pectoris: Secondary | ICD-10-CM | POA: Diagnosis not present

## 2014-11-12 DIAGNOSIS — J189 Pneumonia, unspecified organism: Secondary | ICD-10-CM | POA: Diagnosis not present

## 2014-11-12 DIAGNOSIS — J440 Chronic obstructive pulmonary disease with acute lower respiratory infection: Secondary | ICD-10-CM | POA: Diagnosis not present

## 2014-11-12 NOTE — Telephone Encounter (Signed)
pls call pt: Advise Labs OK-white cells up, likely due to pneumonia. I will repeat in 6 weeks or sooner if he is not feeling well.

## 2014-11-12 NOTE — Telephone Encounter (Signed)
Patient notified of results. Patient expressed understanding.  

## 2014-11-13 ENCOUNTER — Ambulatory Visit (INDEPENDENT_AMBULATORY_CARE_PROVIDER_SITE_OTHER): Payer: Medicare Other | Admitting: Cardiovascular Disease

## 2014-11-13 DIAGNOSIS — I2510 Atherosclerotic heart disease of native coronary artery without angina pectoris: Secondary | ICD-10-CM | POA: Diagnosis not present

## 2014-11-13 DIAGNOSIS — J440 Chronic obstructive pulmonary disease with acute lower respiratory infection: Secondary | ICD-10-CM | POA: Diagnosis not present

## 2014-11-13 DIAGNOSIS — I10 Essential (primary) hypertension: Secondary | ICD-10-CM | POA: Diagnosis not present

## 2014-11-13 DIAGNOSIS — J441 Chronic obstructive pulmonary disease with (acute) exacerbation: Secondary | ICD-10-CM | POA: Diagnosis not present

## 2014-11-13 DIAGNOSIS — Z5181 Encounter for therapeutic drug level monitoring: Secondary | ICD-10-CM

## 2014-11-13 DIAGNOSIS — I4891 Unspecified atrial fibrillation: Secondary | ICD-10-CM | POA: Diagnosis not present

## 2014-11-13 DIAGNOSIS — J189 Pneumonia, unspecified organism: Secondary | ICD-10-CM | POA: Diagnosis not present

## 2014-11-13 LAB — POCT INR: INR: 3.4

## 2014-11-13 NOTE — Progress Notes (Signed)
   Subjective:    Patient ID: [name], male    DOB: 03-09-1936, 79 y.o.   MRN: [id number]  HPI 79 year old male with known history of COPD  11/07/14 Post hospital follow up  Patient presents for a post hospital follow-up Admitted February 16 through February 18 for community acquired pneumonia. Chest x-ray showed left greater than right . Increased interstitial markings in the upper lobes. Cultures were negative. He was treated with IV antibiotics, and transitioned to Levaquin prior to discharge, Patient also was treated for COPD exacerbation with steroids. Since discharge he is slowly improving and returning to baseline He denies any hemoptysis, chest pain, orthopnea, PND or leg swelling. Chest x-ray today shows chronic changes. Discharged home on o2 .    Review of Systems Constitutional:   No  weight loss, night sweats,  Fevers, chills, +fatigue, or  lassitude.  HEENT:   No headaches,  Difficulty swallowing,  Tooth/dental problems, or  Sore throat,                No sneezing, itching, ear ache,  +nasal congestion, post nasal drip,   CV:  No chest pain,  Orthopnea, PND, swelling in lower extremities, anasarca, dizziness, palpitations, syncope.   GI  No heartburn, indigestion, abdominal pain, nausea, vomiting, diarrhea, change in bowel habits, loss of appetite, bloody stools.   Resp:  .  No chest wall deformity  Skin: no rash or lesions.  GU: no dysuria, change in color of urine, no urgency or frequency.  No flank pain, no hematuria   MS:  No joint pain or swelling.  No decreased range of motion.  No back pain.  Psych:  No change in mood or affect. No depression or anxiety.  No memory loss.         Objective:   Physical Exam GEN: A/Ox3; pleasant , NAD,elderly   HEENT:  [place]/AT,  EACs-clear, TMs-wnl, NOSE-clear, THROAT-clear, no lesions, no postnasal drip or exudate noted.   NECK:  Supple w/ fair ROM; no JVD; normal carotid impulses w/o bruits; no thyromegaly or  nodules palpated; no lymphadenopathy.  RESP  Decreased BS in bases .no accessory muscle use, no dullness to percussion  CARD:  RRR, no m/r/g  , no peripheral edema, pulses intact, no cyanosis or clubbing.  GI:   Soft & nt; nml bowel sounds; no organomegaly or masses detected.  Musco: Warm bil, no deformities or joint swelling noted.   Neuro: alert, no focal deficits noted.    Skin: Warm, no lesions or rashes         Assessment & Plan:

## 2014-11-13 NOTE — Assessment & Plan Note (Signed)
Clinically improving   Plan  Finish Levaquin and Prednisone .  Can try Anoro 1 puff daily  Follow up Dr. Gwenette Greet in 4 weeks and As needed

## 2014-11-13 NOTE — Assessment & Plan Note (Signed)
Improving clinically   Plan  Finish Levaquin and Prednisone .  Follow up Dr. Gwenette Greet in 4 weeks and As needed

## 2014-11-14 DIAGNOSIS — J441 Chronic obstructive pulmonary disease with (acute) exacerbation: Secondary | ICD-10-CM | POA: Diagnosis not present

## 2014-11-14 DIAGNOSIS — J440 Chronic obstructive pulmonary disease with acute lower respiratory infection: Secondary | ICD-10-CM | POA: Diagnosis not present

## 2014-11-14 DIAGNOSIS — I2510 Atherosclerotic heart disease of native coronary artery without angina pectoris: Secondary | ICD-10-CM | POA: Diagnosis not present

## 2014-11-14 DIAGNOSIS — I10 Essential (primary) hypertension: Secondary | ICD-10-CM | POA: Diagnosis not present

## 2014-11-14 DIAGNOSIS — I4891 Unspecified atrial fibrillation: Secondary | ICD-10-CM | POA: Diagnosis not present

## 2014-11-14 DIAGNOSIS — J189 Pneumonia, unspecified organism: Secondary | ICD-10-CM | POA: Diagnosis not present

## 2014-11-18 ENCOUNTER — Ambulatory Visit (INDEPENDENT_AMBULATORY_CARE_PROVIDER_SITE_OTHER): Payer: Medicare Other | Admitting: Cardiovascular Disease

## 2014-11-18 DIAGNOSIS — J441 Chronic obstructive pulmonary disease with (acute) exacerbation: Secondary | ICD-10-CM | POA: Diagnosis not present

## 2014-11-18 DIAGNOSIS — Z5181 Encounter for therapeutic drug level monitoring: Secondary | ICD-10-CM

## 2014-11-18 DIAGNOSIS — I4891 Unspecified atrial fibrillation: Secondary | ICD-10-CM

## 2014-11-18 DIAGNOSIS — J440 Chronic obstructive pulmonary disease with acute lower respiratory infection: Secondary | ICD-10-CM | POA: Diagnosis not present

## 2014-11-18 DIAGNOSIS — J189 Pneumonia, unspecified organism: Secondary | ICD-10-CM | POA: Diagnosis not present

## 2014-11-18 DIAGNOSIS — I10 Essential (primary) hypertension: Secondary | ICD-10-CM | POA: Diagnosis not present

## 2014-11-18 DIAGNOSIS — I2510 Atherosclerotic heart disease of native coronary artery without angina pectoris: Secondary | ICD-10-CM | POA: Diagnosis not present

## 2014-11-18 LAB — POCT INR: INR: 1.9

## 2014-11-20 ENCOUNTER — Encounter: Payer: Self-pay | Admitting: Cardiology

## 2014-11-20 ENCOUNTER — Ambulatory Visit (INDEPENDENT_AMBULATORY_CARE_PROVIDER_SITE_OTHER): Payer: Medicare Other | Admitting: Cardiology

## 2014-11-20 VITALS — BP 122/60 | HR 80 | Ht 67.5 in | Wt 199.2 lb

## 2014-11-20 DIAGNOSIS — R0600 Dyspnea, unspecified: Secondary | ICD-10-CM

## 2014-11-20 DIAGNOSIS — I2510 Atherosclerotic heart disease of native coronary artery without angina pectoris: Secondary | ICD-10-CM

## 2014-11-20 DIAGNOSIS — I679 Cerebrovascular disease, unspecified: Secondary | ICD-10-CM

## 2014-11-20 DIAGNOSIS — E7800 Pure hypercholesterolemia, unspecified: Secondary | ICD-10-CM

## 2014-11-20 DIAGNOSIS — Z9889 Other specified postprocedural states: Secondary | ICD-10-CM

## 2014-11-20 DIAGNOSIS — I482 Chronic atrial fibrillation, unspecified: Secondary | ICD-10-CM

## 2014-11-20 DIAGNOSIS — I10 Essential (primary) hypertension: Secondary | ICD-10-CM

## 2014-11-20 NOTE — Assessment & Plan Note (Signed)
Continue statin. 

## 2014-11-20 NOTE — Assessment & Plan Note (Signed)
Continue statin. Not on aspirin given need for Coumadin. 

## 2014-11-20 NOTE — Progress Notes (Signed)
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 No orthopnea, PND, pedal edema, chest pain, palpitations or syncope. No bleeding.  Studies: - LHC 12/06: Mid RCA 40-50%. - Echo (12/09/13): Mod LVH, EF 55-60%, no RWMA, cannot assess diast fxn, mild AI, mean MV gradient 8 mmHg, mod LAE, severe RVE, mild reduced RVSF, mild RAE, mild TR, PASP 50 mmHg (mod Pul HTN),  - Lexiscan Myoview (01/2014): Lateral soft tissue attenuation, no ischemia, not gated, low risk - Carotid Dopplers August 2015 showed 1-39% bilateral stenosis and follow-up recommended in 2 years.  Current Outpatient Prescriptions  Medication Sig Dispense Refill  . albuterol (PROVENTIL HFA;VENTOLIN HFA) 108 (90 BASE) MCG/ACT inhaler Inhale 2 puffs into the lungs every 6 (six) hours as needed for wheezing or shortness of breath. 3 Inhaler 3  . albuterol (PROVENTIL) (2.5 MG/3ML) 0.083% nebulizer solution Take 3 mLs (2.5 mg total) by nebulization every 4 (four) hours as needed for wheezing or shortness of breath. 75 mL 5  . diltiazem (CARDIZEM CD) 240 MG 24 hr capsule Take 1 capsule (240 mg total) by mouth daily. 90 capsule 3  . FLUoxetine (PROZAC) 20 MG tablet Take 1 tablet (20 mg total) by mouth at bedtime. 30 tablet 1  . furosemide (LASIX) 40 MG tablet Take 1 tablet (40 mg total) by mouth daily as needed. 90 tablet 4  . guaiFENesin (MUCINEX) 600 MG 12 hr tablet Take 2 tablets (1,200 mg total) by mouth 2 (two) times daily. 30 tablet 0  . pravastatin (PRAVACHOL) 40 MG tablet TAKE 1 TABLET EVERY EVENING 90 tablet 3  . predniSONE (DELTASONE) 5 MG tablet 8 tablet for 2 days 7 tablets for 2 days 6 tablets for 2 days 5 tablets for 2 days 4 tablets for 2 days 3 tablets for 2 days 2 tablets for 2 days 1 tablet for 2  days then discontinue 150 tablet 0  . sulfaSALAzine (AZULFIDINE) 500 MG tablet Take 1 tablet (500 mg total) by mouth 2 (two) times daily. 180 tablet 3  . traMADol (ULTRAM) 50 MG tablet Take 1 tablet (50 mg total) by mouth every 8 (eight) hours as needed. 30 tablet 0  . Umeclidinium-Vilanterol (ANORO ELLIPTA) 62.5-25 MCG/INH AEPB Inhale 1 puff into the lungs daily. 60 each 3  . warfarin (COUMADIN) 5 MG tablet Take 5-7.5 mg by mouth daily. 5mg  daily and 7.5mg  on Tuesdays     No current facility-administered medications for this visit.     Past Medical History  Diagnosis Date  . Mediastinal lymphadenopathy   . History of prostate cancer   . Atrial fibrillation   . COPD (chronic obstructive pulmonary disease)   . Adrenal adenoma   . Warthin's tumor   . Dyslipidemia   . Diverticulosis   . Embolic stroke     d/t endocarditis 2008  . Atrial flutter   . Ulcerative colitis     left-sided, associated with diverticulosis  . Obstructive chronic bronchitis   . HTN (hypertension)   . Thrombocytopenia   . Emphysema   . Right thyroid nodule   . Hemorrhoids   . TIA (transient ischemic attack) 03/25/2011    ? of due to transient diplopia  . CAD (coronary artery disease)     Lexiscan Myoview (01/2014): Lateral soft tissue attenuation, no ischemia, not gated, low  risk    Past Surgical History  Procedure Laterality Date  . Mitral valve repair  09/22/04    and modified Cox-Maze IV   . Mitral valve repair  09/14/05    redo MV repair d/t endocardiits/embolic events  . Prostatectomy    . Inguinal hernia repair      right  . Cataract extraction, bilateral    . Salivary gland surgery      left resection  . Flexible sigmoidoscopy  01/11/2008; 03/19/2010    2009 and 2011:segmental left colitis, diverticulosis, hemorrhoids  . Colonoscopy w/ biopsies  12/19/2007    left-sided colitis, diverticulosis, hemorrhoids    History   Social History  . Marital Status: Married    Spouse Name: Jeanett Schlein  .  Number of Children: 2  . Years of Education: N/A   Occupational History  . RETIRED     [employer]   .     Social History Main Topics  . Smoking status: Former Smoker -- 3.00 packs/day for 60 years    Types: Cigarettes    Quit date: 09/19/2002  . Smokeless tobacco: Never Used  . Alcohol Use: No  . Drug Use: No  . Sexual Activity: Not on file   Other Topics Concern  . Not on file   Social History Narrative   Pt has children    ROS: no fevers or chills, productive cough, hemoptysis, dysphasia, odynophagia, melena, hematochezia, dysuria, hematuria, rash, seizure activity, orthopnea, PND, pedal edema, claudication. Remaining systems are negative.  Physical Exam: Well-developed well-nourished in no acute distress.  Skin is warm and dry.  HEENT is normal.  Neck is supple.  Chest is clear to auscultation with normal expansion.  Cardiovascular exam is irregular Abdominal exam nontender or distended. No masses palpated. Extremities show no edema. neuro grossly intact  ECG 11/04/2014 atrial fibrillation with PVCs or aberrantly conducted beats. Right axis deviation.

## 2014-11-20 NOTE — Patient Instructions (Signed)
Your physician wants you to follow-up in: 6 MONTHS WITH DR CRENSHAW You will receive a reminder letter in the mail two months in advance. If you don't receive a letter, please call our office to schedule the follow-up appointment.  

## 2014-11-20 NOTE — Assessment & Plan Note (Signed)
Blood pressure controlled. Continue present medications. 

## 2014-11-20 NOTE — Assessment & Plan Note (Signed)
Continue SBE prophylaxis. 

## 2014-11-20 NOTE — Assessment & Plan Note (Signed)
Patient continues to have dyspnea. This is most likely multifactorial including COPD, deconditioning and possible diastolic congestive heart failure. He is euvolemic on examination. Continue present dose of Lasix.

## 2014-11-20 NOTE — Assessment & Plan Note (Signed)
Patient back in atrial fibrillation. His symptoms are unchanged. Land rate control and anticoagulation. Continue Cardizem and Coumadin.

## 2014-11-20 NOTE — Assessment & Plan Note (Signed)
Follow-up carotid Dopplers August 2017.

## 2014-11-21 ENCOUNTER — Ambulatory Visit: Payer: Medicare Other | Admitting: Cardiology

## 2014-11-21 DIAGNOSIS — I2510 Atherosclerotic heart disease of native coronary artery without angina pectoris: Secondary | ICD-10-CM | POA: Diagnosis not present

## 2014-11-21 DIAGNOSIS — J440 Chronic obstructive pulmonary disease with acute lower respiratory infection: Secondary | ICD-10-CM | POA: Diagnosis not present

## 2014-11-21 DIAGNOSIS — I10 Essential (primary) hypertension: Secondary | ICD-10-CM | POA: Diagnosis not present

## 2014-11-21 DIAGNOSIS — J189 Pneumonia, unspecified organism: Secondary | ICD-10-CM | POA: Diagnosis not present

## 2014-11-21 DIAGNOSIS — J441 Chronic obstructive pulmonary disease with (acute) exacerbation: Secondary | ICD-10-CM | POA: Diagnosis not present

## 2014-11-21 DIAGNOSIS — I4891 Unspecified atrial fibrillation: Secondary | ICD-10-CM | POA: Diagnosis not present

## 2014-11-25 ENCOUNTER — Encounter: Payer: Self-pay | Admitting: Family Medicine

## 2014-11-25 ENCOUNTER — Ambulatory Visit (INDEPENDENT_AMBULATORY_CARE_PROVIDER_SITE_OTHER): Payer: Medicare Other | Admitting: Cardiovascular Disease

## 2014-11-25 ENCOUNTER — Telehealth: Payer: Self-pay | Admitting: Cardiology

## 2014-11-25 DIAGNOSIS — J189 Pneumonia, unspecified organism: Secondary | ICD-10-CM | POA: Diagnosis not present

## 2014-11-25 DIAGNOSIS — J440 Chronic obstructive pulmonary disease with acute lower respiratory infection: Secondary | ICD-10-CM | POA: Diagnosis not present

## 2014-11-25 DIAGNOSIS — I4891 Unspecified atrial fibrillation: Secondary | ICD-10-CM | POA: Diagnosis not present

## 2014-11-25 DIAGNOSIS — J441 Chronic obstructive pulmonary disease with (acute) exacerbation: Secondary | ICD-10-CM | POA: Diagnosis not present

## 2014-11-25 DIAGNOSIS — I482 Chronic atrial fibrillation, unspecified: Secondary | ICD-10-CM

## 2014-11-25 DIAGNOSIS — I10 Essential (primary) hypertension: Secondary | ICD-10-CM | POA: Diagnosis not present

## 2014-11-25 DIAGNOSIS — R0602 Shortness of breath: Secondary | ICD-10-CM | POA: Diagnosis not present

## 2014-11-25 DIAGNOSIS — Z5181 Encounter for therapeutic drug level monitoring: Secondary | ICD-10-CM

## 2014-11-25 DIAGNOSIS — I2510 Atherosclerotic heart disease of native coronary artery without angina pectoris: Secondary | ICD-10-CM | POA: Diagnosis not present

## 2014-11-25 LAB — POCT INR: INR: 2

## 2014-11-25 NOTE — Telephone Encounter (Signed)
I cannot locate the chest xray results.  I left a message for Tine to fax the results to the fax machine in the nurse station.

## 2014-11-25 NOTE — Telephone Encounter (Signed)
I spoke with Otila Kluver from [facility] who is at patient's home now.  She reports that [name] has worsening SOB with ambulation in association with a productive cough (sputum is brownish, yellowish).  She reports crackles in the left lung lobe.  These symptoms worsened approximately 2 days ago; of note, approximately 1 week ago he had chills at night, but did not take his temp.  His weight is down 3 lbs.  Otila Kluver wanted a verbal order to do a mobile chest xray.  I gave the order and confirmed that Shaun Kading's SOB was not at the level that needed acute ER visit.

## 2014-11-25 NOTE — Telephone Encounter (Signed)
She wants to know if you received his chest xray? Also let her know if there any medication changes please.

## 2014-11-26 ENCOUNTER — Telehealth: Payer: Self-pay | Admitting: Nurse Practitioner

## 2014-11-26 DIAGNOSIS — J189 Pneumonia, unspecified organism: Secondary | ICD-10-CM | POA: Diagnosis not present

## 2014-11-26 DIAGNOSIS — I4891 Unspecified atrial fibrillation: Secondary | ICD-10-CM | POA: Diagnosis not present

## 2014-11-26 DIAGNOSIS — J441 Chronic obstructive pulmonary disease with (acute) exacerbation: Secondary | ICD-10-CM | POA: Diagnosis not present

## 2014-11-26 DIAGNOSIS — J440 Chronic obstructive pulmonary disease with acute lower respiratory infection: Secondary | ICD-10-CM | POA: Diagnosis not present

## 2014-11-26 DIAGNOSIS — I2510 Atherosclerotic heart disease of native coronary artery without angina pectoris: Secondary | ICD-10-CM | POA: Diagnosis not present

## 2014-11-26 DIAGNOSIS — I10 Essential (primary) hypertension: Secondary | ICD-10-CM | POA: Diagnosis not present

## 2014-11-26 NOTE — Telephone Encounter (Signed)
He needs to be seen-may have flu.

## 2014-11-26 NOTE — Telephone Encounter (Signed)
Called and spoke with daughter [name], scheduled an appointment for 11/27/14 at 830a

## 2014-11-26 NOTE — Telephone Encounter (Signed)
Pts daughter called and wondered what kind of medication he could take  ( either otc or prescription) that will not affect his other meds. Please call Heather at [phone] with any suggestions you have.

## 2014-11-26 NOTE — Telephone Encounter (Signed)
Please Advise: Patient called triage last night with a fever of 102 and an irregular heartbeat. Any OTC meds that can help with this and not interfere with his meds?

## 2014-11-27 ENCOUNTER — Encounter: Payer: Self-pay | Admitting: Nurse Practitioner

## 2014-11-27 ENCOUNTER — Ambulatory Visit (INDEPENDENT_AMBULATORY_CARE_PROVIDER_SITE_OTHER): Payer: Medicare Other | Admitting: Nurse Practitioner

## 2014-11-27 ENCOUNTER — Encounter: Payer: Self-pay | Admitting: Physician Assistant

## 2014-11-27 ENCOUNTER — Ambulatory Visit (INDEPENDENT_AMBULATORY_CARE_PROVIDER_SITE_OTHER): Payer: Medicare Other | Admitting: Physician Assistant

## 2014-11-27 ENCOUNTER — Telehealth: Payer: Self-pay | Admitting: Nurse Practitioner

## 2014-11-27 ENCOUNTER — Telehealth: Payer: Self-pay | Admitting: *Deleted

## 2014-11-27 ENCOUNTER — Telehealth: Payer: Self-pay | Admitting: Cardiology

## 2014-11-27 VITALS — BP 132/60 | HR 94 | Ht 67.0 in | Wt 196.6 lb

## 2014-11-27 VITALS — BP 120/60 | HR 70 | Temp 98.3°F | Resp 22 | Ht 67.0 in | Wt 196.0 lb

## 2014-11-27 DIAGNOSIS — J449 Chronic obstructive pulmonary disease, unspecified: Secondary | ICD-10-CM | POA: Diagnosis not present

## 2014-11-27 DIAGNOSIS — R0602 Shortness of breath: Secondary | ICD-10-CM

## 2014-11-27 DIAGNOSIS — I10 Essential (primary) hypertension: Secondary | ICD-10-CM

## 2014-11-27 DIAGNOSIS — F411 Generalized anxiety disorder: Secondary | ICD-10-CM

## 2014-11-27 DIAGNOSIS — J438 Other emphysema: Secondary | ICD-10-CM

## 2014-11-27 DIAGNOSIS — H6121 Impacted cerumen, right ear: Secondary | ICD-10-CM | POA: Diagnosis not present

## 2014-11-27 DIAGNOSIS — Z9889 Other specified postprocedural states: Secondary | ICD-10-CM | POA: Diagnosis not present

## 2014-11-27 DIAGNOSIS — I2510 Atherosclerotic heart disease of native coronary artery without angina pectoris: Secondary | ICD-10-CM

## 2014-11-27 DIAGNOSIS — I482 Chronic atrial fibrillation, unspecified: Secondary | ICD-10-CM

## 2014-11-27 DIAGNOSIS — Z7901 Long term (current) use of anticoagulants: Secondary | ICD-10-CM

## 2014-11-27 DIAGNOSIS — R509 Fever, unspecified: Secondary | ICD-10-CM | POA: Diagnosis not present

## 2014-11-27 LAB — POCT URINALYSIS DIPSTICK
GLUCOSE UA: NEGATIVE
Ketones, UA: NEGATIVE
Leukocytes, UA: NEGATIVE
Nitrite, UA: NEGATIVE
PH UA: 6.5
Protein, UA: 100
Spec Grav, UA: 1.025
UROBILINOGEN UA: 4

## 2014-11-27 MED ORDER — LORAZEPAM 0.5 MG PO TABS: 0.5000 mg | ORAL_TABLET | Freq: Two times a day (BID) | ORAL | Status: DC | PRN

## 2014-11-27 MED ORDER — ALBUTEROL SULFATE (5 MG/ML) 0.5% IN NEBU
5.0000 mg | INHALATION_SOLUTION | Freq: Once | RESPIRATORY_TRACT | Status: AC
  Administered 2014-11-27: 5 mg via RESPIRATORY_TRACT

## 2014-11-27 MED ORDER — LEVOFLOXACIN 500 MG PO TABS: 500.0000 mg | ORAL_TABLET | Freq: Every day | ORAL | Status: DC

## 2014-11-27 MED ORDER — PREDNISONE 10 MG PO TABS: ORAL_TABLET | ORAL | Status: DC

## 2014-11-27 MED ORDER — METHYLPREDNISOLONE ACETATE 40 MG/ML IJ SUSP: 40.0000 mg | Freq: Once | INTRAMUSCULAR | Status: DC

## 2014-11-27 MED ORDER — METHYLPREDNISOLONE ACETATE 40 MG/ML IJ SUSP
40.0000 mg | Freq: Once | INTRAMUSCULAR | Status: AC
  Administered 2014-11-27: 40 mg via INTRAMUSCULAR

## 2014-11-27 MED ORDER — ALBUTEROL SULFATE (2.5 MG/3ML) 0.083% IN NEBU: 2.5000 mg | INHALATION_SOLUTION | Freq: Four times a day (QID) | RESPIRATORY_TRACT | Status: DC | PRN

## 2014-11-27 MED ORDER — IPRATROPIUM BROMIDE 0.02 % IN SOLN: 0.5000 mg | Freq: Once | RESPIRATORY_TRACT | Status: DC

## 2014-11-27 NOTE — Progress Notes (Signed)
Pre visit review using our clinic review tool, if applicable. No additional management support is needed unless otherwise documented below in the visit note. 

## 2014-11-27 NOTE — Assessment & Plan Note (Signed)
Blood pressure controlled.  No changes in current therapy

## 2014-11-27 NOTE — Patient Instructions (Signed)
Your physician recommends that you continue on your current medications as directed. Please refer to the Current Medication list given to you today.  Your physician recommends that you schedule a follow-up appointment in: 1 month with Dr.Crenshaw

## 2014-11-27 NOTE — Assessment & Plan Note (Signed)
Chest x-ray 2 days ago shows mild pulmonary vascular congestion with no pulmonary consolidation. This was 2 days ago. He does have decreased breath sounds on the left with either a changes. Agree with starting Levaquin and prednisone he has had some breathing treatment treatments today.  With recent fever(afebrile today) and malaise his symptoms may be from a viral etiology.  I do not think the patient is volume overloaded. He has no signs exam and his weight is actually down 3 pounds from his prior visit.  Continue monitor fluid status with daily weights while on prednisone.  O2 saturations were 91% on oxygen and 88% on room air while in the clinic here.

## 2014-11-27 NOTE — Telephone Encounter (Signed)
Otila Kluver called to inform us that pt has been Dx with the Flu. He has been prescribed Levaquin 500mg s QD for 7 days and Prednisone taper for 8 days. Starting Prednisone on 11/28/14 due to Medrol injection in office. Otila Kluver given an order to check INR on Monday.

## 2014-11-27 NOTE — Progress Notes (Signed)
Patient ID: [name], male   DOB: 1935-11-21, 79 y.o.   MRN: [id number]    Date:  11/27/2014   ID:  [name], DOB 1936/02/21, MRN [id number]  PCP:  Irene Pap, NP  Primary Cardiologist:  Leighton Roach chief complaint on file.    History of Present Illness: [name] is a 78 y.o. male is here with his daughter-in-law who is an EMT and appears to take very good care of him.  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   Since Sunday the patient's known as shortness of breath while sitting he's also had some dizziness with exertion along with blurry vision. He said some fever.  Two pillow orthopnea 1 week.  PND last night. No lower extremity edema but he has felt malaise and lethargic as well.  He was just seen by Nicky Pugh, nurse practitioner. She prescribed prednisone and Levaquin.  He usually uses oxygen at night and as needed during the day.  Chest x-ray 2 days ago showed mild central pulmonary vascular congestion which according to his daughter-in-law is normal for him.  Weight is 3 pounds less than his office visit on March 3.  Studies: - LHC 12/06: Mid RCA 40-50%. - Echo (12/09/13): Mod LVH, EF 55-60%, no RWMA, cannot assess diast fxn, mild AI, mean MV gradient 8 mmHg, mod LAE, severe RVE, mild reduced RVSF, mild RAE, mild TR, PASP 50 mmHg (mod Pul HTN),  - Lexiscan Myoview (01/2014): Lateral soft tissue attenuation, no ischemia, not gated, low risk - Carotid Dopplers August 2015 showed 1-39% bilateral stenosis and follow-up recommended in 2 years.   The patient currently denies nausea, vomiting, fever, chest pain, cough, congestion, abdominal pain, hematochezia, melena.  Wt Readings from Last 3 Encounters:  11/27/14 196 lb 9.6 oz (89.177 kg)    11/27/14 196 lb (88.905 kg)  11/20/14 199 lb 3.2 oz (90.357 kg)     Past Medical History  Diagnosis Date  . Mediastinal lymphadenopathy   . History of prostate cancer   . Atrial fibrillation     Coumadin  . COPD (chronic obstructive pulmonary disease)   . Adrenal adenoma   . Warthin's tumor   . Dyslipidemia   . Diverticulosis   . Embolic stroke     d/t endocarditis 2008  . Atrial flutter   . Ulcerative colitis     left-sided/segmental, associated with diverticulosis.  Sulfasalazine per GI (Dr. Carlean Purl).  Marland Kitchen HTN (hypertension)   . Thrombocytopenia   . Right thyroid nodule   . Hemorrhoids   . TIA (transient ischemic attack) 03/25/2011    ? of due to transient diplopia  . CAD (coronary artery disease)     Lexiscan Myoview (01/2014): Lateral soft tissue attenuation, no ischemia, not gated, low risk  . CAP (community acquired pneumonia)     Hospitalized 10/2014  . History of mitral valve replacement     Current Outpatient Prescriptions  Medication Sig Dispense Refill  . albuterol (PROVENTIL HFA;VENTOLIN HFA) 108 (90 BASE) MCG/ACT inhaler Inhale 2 puffs into the lungs every 6 (six) hours as needed for wheezing or shortness of breath. 3 Inhaler 3  . diltiazem (CARDIZEM CD) 240 MG 24 hr capsule Take 1 capsule (240 mg total) by mouth daily. 90 capsule 3  . furosemide (LASIX) 40 MG  tablet Take 1 tablet (40 mg total) by mouth daily as needed. 90 tablet 4  . guaiFENesin (MUCINEX) 600 MG 12 hr tablet Take 2 tablets (1,200 mg total) by mouth 2 (two) times daily. 30 tablet 0  . pravastatin (PRAVACHOL) 40 MG tablet TAKE 1 TABLET EVERY EVENING 90 tablet 3  . predniSONE (DELTASONE) 10 MG tablet Starting tomorrow, Take 3Tpo qam X 2d, then 2T po qam X 3d, then 1T po qd X 3d. 15 tablet 0  . Umeclidinium-Vilanterol (ANORO ELLIPTA) 62.5-25 MCG/INH AEPB Inhale 1 puff into the lungs daily. 60 each 3  . warfarin (COUMADIN) 5 MG tablet Take 5-7.5 mg by mouth daily. 5mg  daily and 7.5mg  on Tuesdays    .  albuterol (PROVENTIL) (2.5 MG/3ML) 0.083% nebulizer solution Take 3 mLs (2.5 mg total) by nebulization every 6 (six) hours as needed for wheezing or shortness of breath. (Patient not taking: Reported on 11/27/2014) 75 mL 5  . LORazepam (ATIVAN) 0.5 MG tablet Take 1 tablet (0.5 mg total) by mouth 2 (two) times daily as needed for anxiety. (Patient not taking: Reported on 11/27/2014) 30 tablet 1  . traMADol (ULTRAM) 50 MG tablet Take 1 tablet (50 mg total) by mouth every 8 (eight) hours as needed. (Patient not taking: Reported on 11/27/2014) 30 tablet 0   No current facility-administered medications for this visit.   Facility-Administered Medications Ordered in Other Visits  Medication Dose Route Frequency Provider Last Rate Last Dose  . albuterol (PROVENTIL) (5 MG/ML) 0.5% nebulizer solution 5 mg  5 mg Nebulization Once Irene Pap, NP      . ipratropium (ATROVENT) nebulizer solution 0.5 mg  0.5 mg Nebulization Once Irene Pap, NP      . methylPREDNISolone acetate (DEPO-MEDROL) injection 40 mg  40 mg Intramuscular Once Irene Pap, NP        Allergies:   No Known Allergies  Social History:  The patient  reports that he quit smoking about 12 years ago. His smoking use included Cigarettes. He has a 180 pack-year smoking history. He has never used smokeless tobacco. He reports that he does not drink alcohol or use illicit drugs.   Family history:   Family History  Problem Relation Age of Onset  . Stroke Father   . Heart disease Father   . Cancer Mother     spinal  . Colon cancer Neg Hx     ROS:  Please see the history of present illness.  All other systems reviewed and negative.   PHYSICAL EXAM: VS:  BP 132/60 mmHg  Pulse 94  Ht 5\' 7"  (1.702 m)  Wt 196 lb 9.6 oz (89.177 kg)  BMI 30.78 kg/m2  SpO2 91% [place], well developed, in no acute distress HEENT: Pupils are equal round react to light accommodation extraocular movements are intact.  Neck: no JVDNo cervical  lymphadenopathy. Cardiac: Regular rate and rhythm without murmurs rubs or gallops. Lungs:  Decreased breath sounds on the left compared to the right no wheezes or rhonchi or rales Abd: soft, nontender, positive bowel sounds all quadrants, Ext: no lower extremity edema.  2+ radial and dorsalis pedis pulses. Skin: warm and dry Neuro:  Grossly normal    ASSESSMENT AND PLAN:  Problem List Items Addressed This Visit    MITRAL VALVE REPLACEMENT, HX OF   Long term current use of anticoagulant   Essential hypertension, benign - Primary    Blood pressure controlled.  No changes in current therapy  Coronary atherosclerosis    No complaints of angina      COPD (chronic obstructive pulmonary disease) with emphysema    Chest x-ray 2 days ago shows mild pulmonary vascular congestion with no pulmonary consolidation. This was 2 days ago. He does have decreased breath sounds on the left with either a changes. Agree with starting Levaquin and prednisone he has had some breathing treatment treatments today.  With recent fever(afebrile today) and malaise his symptoms may be from a viral etiology.  I do not think the patient is volume overloaded. He has no signs exam and his weight is actually down 3 pounds from his prior visit.  Continue monitor fluid status with daily weights while on prednisone.  O2 saturations were 91% on oxygen and 88% on room air while in the clinic here.        Atrial fibrillation    Rate is well controlled. He is on diltiazem and Coumadin.

## 2014-11-27 NOTE — Assessment & Plan Note (Signed)
No complaints of angina. 

## 2014-11-27 NOTE — Telephone Encounter (Signed)
Please Advise

## 2014-11-27 NOTE — Patient Instructions (Addendum)
Take extra dose lasix today. No salt intake.  Stop fluoxetine for now.  Start prednisone tomorrow morning If dr Stanford Breed agrees. Start levaquin if Dr Stanford Breed agrees.  Take ativan when feeling short if breath & use nebulizer treatment as directed. Stop clonazepam.    See Dr Stanford Breed today.

## 2014-11-27 NOTE — Telephone Encounter (Signed)
[name] calling, pt reported for visit, SOB on home O2 at rest. Trying to avoid sending pt for hospital admission. Summation is that she feels this is vascular congestion - has not seen X-ray yet to be able to review. REquesting same day add-on,   I informed I would route to see if someone can add.  Call back pt's daughter [name] [phone] to advise on appt.

## 2014-11-27 NOTE — Assessment & Plan Note (Signed)
Rate is well controlled. He is on diltiazem and Coumadin.

## 2014-11-27 NOTE — Telephone Encounter (Signed)
Called and informed patients daughter in law that medicines were at the pharmacy.

## 2014-11-27 NOTE — Telephone Encounter (Signed)
Same day add-in by Zigmund Daniel for Tarri Fuller @ 11:30 at [facility] location. Pt was notified, voiced understanding.

## 2014-11-27 NOTE — Telephone Encounter (Signed)
Patient's daughter-in-law called in to say that the cardiologist said it is okay to fill Prednisone Ativan & Albuterol Rx's. She did also notice that Levequin was not listed on patient's med list.

## 2014-11-27 NOTE — Telephone Encounter (Signed)
levaquin & all other meds sent to [facility]. Pls advise Heather-dtr-in-law.

## 2014-11-28 ENCOUNTER — Telehealth: Payer: Self-pay | Admitting: Nurse Practitioner

## 2014-11-28 DIAGNOSIS — H6120 Impacted cerumen, unspecified ear: Secondary | ICD-10-CM | POA: Insufficient documentation

## 2014-11-28 DIAGNOSIS — F411 Generalized anxiety disorder: Secondary | ICD-10-CM | POA: Insufficient documentation

## 2014-11-28 DIAGNOSIS — R0602 Shortness of breath: Secondary | ICD-10-CM | POA: Insufficient documentation

## 2014-11-28 LAB — URINE CULTURE
Colony Count: NO GROWTH
ORGANISM ID, BACTERIA: NO GROWTH

## 2014-11-28 NOTE — Telephone Encounter (Signed)
He is doing better today! Was even able to get into shower (even though that did wear him down some), he was still feeling good to get up and shower and move around! Gave them advise to go to ER over the weekend if needed! Appointment made for f/u for 12/03/14 at 8am

## 2014-11-28 NOTE — Telephone Encounter (Signed)
LMOVM for patient to return call.

## 2014-11-28 NOTE — Progress Notes (Signed)
ven inSubjective:     [name] is a 79 y.o. male who presents for evaluation of fever, body aches, dyspnea at rest, nasal congestion. He is accompanied by his daughter in law-[name].  He has had the fever for 4 days-typically spikes in evening. He is taking tylenol every 4 hours with relief.  He took extra dose lasix yesterday without relief. He is using anoro qd & albuterol prn. He is not using neb machine. Inhalers do not relieve SOB. Dtr-in-law states wife thinks there may be anxiety component when SOB, exacerbating symptoms. [name] says he didn't sleep well last night due to dyspnea-sitting in chair did not help.  Patient denies abdominal pain, diarrhea, headache and nausea.   The patient has Hx of MV replacement, a-fib -coumadin & cardizem,  CVD, COPD, IBD- sulfasalazine, prostate cancer, recent pneumonia w/hopsitalization 4 weeks ago. Reviewed recent cardio note dated 3/3: No changes in Tx. Dyspnea felt to be multifactorial including COPD, deconditioning and possible diastolic congestive heart failure. Pt was to continue on daily lasix. Reviewed last pulm note dated 2/19: pt was to fin ABX & prednisone & continue anoro qd, f/u 4 wks, PRN  I last saw pt in ofc 2/23 post hsptl d/c: he was much improved w/breathing. He c/o "crying" easily. After discussion I prescribed fluoxetine. He did not start taking medication. Clonazepam was prescribed by historical provider. Dtr-in-law says "it wipes him out". I would like for him to use low dose ativan when feeling SOB in addition to prescribed meds for COPD & heart disease.  Home health has been visiting since hospitl d/c: 1 View CXR was performed 2 dys ago revealing "mild central pulm vascular congestion & perihilar congestive changes. No pulm consolidation or pleural effusion."  Call placed to cardiology: spk w/ Ovid Curd RN regarding increased dyspnea & CXR findings. Pt will be seen in cardio ofc today.  The following portions of the patient's  history were reviewed and updated as appropriate: allergies, current medications, past medical history, past social history, past surgical history and problem list.  Review of Systems Pertinent items are noted in HPI.   Objective:    BP 120/60 mmHg  Pulse 70  Temp(Src) 98.3 F (36.8 C) (Oral)  Resp 22  Ht 5\' 7"  (1.702 m)  Wt 196 lb (88.905 kg)  BMI 30.69 kg/m2  SpO2 90% General appearance: alert, cooperative, appears stated age and mild distress Head: Normocephalic, without obvious abnormality, atraumatic Eyes: negative findings: lids and lashes normal and conjunctivae and sclerae normal Ears: clear fluid L TM bones visible. Cerumen occluding R TM. Throat: lips, mucosa, and tongue normal; teeth and gums normal Lungs: clear to auscultation bilaterally and RR 20 to 25 while sitting in chair talking w/2L O2 via [place]. Heart: rate is reg & controlled this am Abdomen: obese, soft, NT Lymph nodes: Cervical adenopathy: none and Axillary adenopathy: none Neurologic: Mental status: alert & oriented to self time & place. Asking more questions than usual-seems to be mild confusion    In- [facility] Breathing Tx X1: no improvement in dyspnea. Ear lavage: unsuccessful. Curette used. R TM nml, bones visible.  Assessment:Plan   1. Anxiety state, new R/t SOB - LORazepam (ATIVAN) 0.5 MG tablet; Take 1 tablet (0.5 mg total) by mouth 2 (two) times daily as needed for anxiety. (Patient not taking: Reported on 11/27/2014)  Dispense: 30 tablet; Refill: 1  2. SOB (shortness of breath), exacerbation Multifactorial: COPD, CHF, infection? - predniSONE (DELTASONE) 10 MG tablet; Starting tomorrow, Take 3Tpo  qam X 2d, then 2T po qam X 3d, then 1T po qd X 3d.  Dispense: 15 tablet; Refill: 0 - albuterol (PROVENTIL) (2.5 MG/3ML) 0.083% nebulizer solution; Take 3 mLs (2.5 mg total) by nebulization every 6 (six) hours as needed for wheezing or shortness of breath. (Patient not taking: Reported on 11/27/2014)  Dispense: 75  mL; Refill: 5 IN OFFICE: NO IMPROVEMENT IN DYSPNEA AFTER TREATMENT - methylPREDNISolone acetate (DEPO-MEDROL) injection 40 mg; Inject 1 mL (40 mg total) into the muscle once. - albuterol (PROVENTIL) (5 MG/ML) 0.5% nebulizer solution 5 mg; Take 1 mL (5 mg total) by nebulization once. - ipratropium (ATROVENT) nebulizer solution 0.5 mg; Take 2.5 mLs (0.5 mg total) by nebulization once.  3. Fever, unspecified fever cause, new DD: pneumonia, flu, UTI - POCT urinalysis dipstick - Urine culture - levofloxacin (LEVAQUIN) 500 MG tablet; Take 1 tablet (500 mg total) by mouth daily.  Dispense: 7 tablet; Refill: 0  4 Cerumen impaction, R Unsuccessful warm water flush Curette used to remove wAX plug  Pt Has cardio appt today.  F/u in 4 to 5 days. If increase in SOB over weekend, unrelieved by inhalers & lasix, Go to ER.

## 2014-11-28 NOTE — Telephone Encounter (Signed)
pls call pt: Ask if still having fever, feeling SOB? Schedule f/u OV early next week. Advise to go to ER over weekend if SOB is not relieved w/inhaler or lasix or ativan. Call dtr-in-law, [name], with same message.

## 2014-12-01 ENCOUNTER — Ambulatory Visit (INDEPENDENT_AMBULATORY_CARE_PROVIDER_SITE_OTHER): Payer: Medicare Other | Admitting: Internal Medicine

## 2014-12-01 DIAGNOSIS — J189 Pneumonia, unspecified organism: Secondary | ICD-10-CM | POA: Diagnosis not present

## 2014-12-01 DIAGNOSIS — I10 Essential (primary) hypertension: Secondary | ICD-10-CM | POA: Diagnosis not present

## 2014-12-01 DIAGNOSIS — I2510 Atherosclerotic heart disease of native coronary artery without angina pectoris: Secondary | ICD-10-CM | POA: Diagnosis not present

## 2014-12-01 DIAGNOSIS — I4891 Unspecified atrial fibrillation: Secondary | ICD-10-CM | POA: Diagnosis not present

## 2014-12-01 DIAGNOSIS — J440 Chronic obstructive pulmonary disease with acute lower respiratory infection: Secondary | ICD-10-CM | POA: Diagnosis not present

## 2014-12-01 DIAGNOSIS — J441 Chronic obstructive pulmonary disease with (acute) exacerbation: Secondary | ICD-10-CM | POA: Diagnosis not present

## 2014-12-01 DIAGNOSIS — Z5181 Encounter for therapeutic drug level monitoring: Secondary | ICD-10-CM

## 2014-12-01 LAB — POCT INR: INR: 6.3

## 2014-12-02 ENCOUNTER — Telehealth: Payer: Self-pay | Admitting: *Deleted

## 2014-12-02 NOTE — Telephone Encounter (Signed)
Left message for pt to call  CXR report received and reviewed by dr Stanford Breed No changes at this time, report placed for scanning.

## 2014-12-02 NOTE — Telephone Encounter (Signed)
Spoke with pt, .aware of results.  

## 2014-12-03 ENCOUNTER — Ambulatory Visit (INDEPENDENT_AMBULATORY_CARE_PROVIDER_SITE_OTHER): Payer: Medicare Other | Admitting: Nurse Practitioner

## 2014-12-03 ENCOUNTER — Ambulatory Visit: Payer: Medicare Other | Admitting: Nurse Practitioner

## 2014-12-03 ENCOUNTER — Encounter: Payer: Self-pay | Admitting: Nurse Practitioner

## 2014-12-03 VITALS — BP 132/81 | HR 132 | Temp 98.0°F | Resp 20 | Ht 67.0 in | Wt 193.0 lb

## 2014-12-03 DIAGNOSIS — I2510 Atherosclerotic heart disease of native coronary artery without angina pectoris: Secondary | ICD-10-CM | POA: Diagnosis not present

## 2014-12-03 DIAGNOSIS — I482 Chronic atrial fibrillation, unspecified: Secondary | ICD-10-CM

## 2014-12-03 DIAGNOSIS — R509 Fever, unspecified: Secondary | ICD-10-CM | POA: Diagnosis not present

## 2014-12-03 DIAGNOSIS — Z23 Encounter for immunization: Secondary | ICD-10-CM

## 2014-12-03 NOTE — Progress Notes (Signed)
Subjective:     [name] is a 79 y.o. male presents for f/u of fever, cough, SOB. He is accompanied by wife & daughter-in-law today.  Pt was seen in ofc last wk presenting w 4 days fever, body aches, dyspnea at rest, & nasal congestion. It was not clear if SOB was from infection, fluid overload, or anxiety or combination of factors. He had taken extra dose lasix without relief; used rescue inh without relief, and was using continuous O2 by [place]. Pt was started on levaquin & prednisone and seen by cardiology who did not think he was fluid overloaded.  Today pt is feeling better. All symptoms have resolved except for SOB which is improved. Pt reports increase in SOB yesterday afternoon. He used neb machine with relief. Dtr-in-law reports SOB was proceeded by having not used O2 for about 1.5 hrs & riding on golf cart around farm "stirring up dust".   Dtr-in-law states ativan seems to help when he is SOB. Pt isn't sure stating "Itake so many pills I don't know what helps me." No drowsiness observed w/ativan. Klonpin was d/c'd. He has 1 more day of levaquin & 2 more days prednisone. INR was 6. He was instructed to skip 3 days & will have INR checked again in 5 days.  We discussed "crying spells". Hold off on SSRI for now due to interaction w/coumadin. Pt & family agree.  The following portions of the patient's history were reviewed and updated as appropriate: allergies, current medications, past medical history, past social history, past surgical history and problem list.  Review of Systems Behavioral/Psych: negative for sleep disturbance    Objective:    BP 132/81 mmHg  Pulse 132  Temp(Src) 98 F (36.7 C) (Temporal)  Ht 5\' 7"  (1.702 m)  Wt 193 lb (87.544 kg)  BMI 30.22 kg/m2  SpO2 89% BP 132/81 mmHg  Pulse 132  Temp(Src) 98 F (36.7 C) (Temporal)  Ht 5\' 7"  (1.702 m)  Wt 193 lb (87.544 kg)  BMI 30.22 kg/m2  SpO2 89% General appearance: alert, cooperative, appears stated age and no  distress Head: Normocephalic, without obvious abnormality, atraumatic Eyes: conjunctivae/corneas clear. PERRL, EOM's intact. Fundi benign. Lungs: clear to auscultation bilaterally and RR 15-20. Takes breath every 5 to 6 words. Not wearing O2-pt sd "it is in car". Heart: regularly irregular rhythm and rate is 130, regular Extremities: extremities normal, atraumatic, no cyanosis or edema Neurologic: Grossly normal    Assessment:Plan    1. Need for pneumococcal vaccine - Pneumococcal conjugate vaccine 13-valent IM  2. Fever and chills resolved 3. Chronic atrial fibrillation Rate 130 Mildly SOB Continue cardizem & coumadin F/u w/cardiology  F/u 6 mos

## 2014-12-03 NOTE — Patient Instructions (Signed)
Glad you are feeling better.  Please avoid triggers that result in shortness if breath, like breathing dust outside when riding on golf cart-wear a mask. Do not leave off oxygen. You need it all the time. You may not feel short of breath at the moment that it is off, but you may several minutes to an hour later. Continue to use ativan as directed, when feeling short of breath. Keep appointment with Dr Gwenette Greet next week. Make CMA appt for tetanus vaccine next week.  See me in 6 months.

## 2014-12-03 NOTE — Progress Notes (Signed)
Pre visit review using our clinic review tool, if applicable. No additional management support is needed unless otherwise documented below in the visit note. 

## 2014-12-04 ENCOUNTER — Encounter: Payer: Self-pay | Admitting: Cardiology

## 2014-12-08 ENCOUNTER — Telehealth: Payer: Self-pay | Admitting: Cardiology

## 2014-12-08 ENCOUNTER — Ambulatory Visit (INDEPENDENT_AMBULATORY_CARE_PROVIDER_SITE_OTHER): Payer: Medicare Other | Admitting: Interventional Cardiology

## 2014-12-08 ENCOUNTER — Ambulatory Visit: Payer: Medicare Other | Admitting: Family Medicine

## 2014-12-08 DIAGNOSIS — Z5181 Encounter for therapeutic drug level monitoring: Secondary | ICD-10-CM

## 2014-12-08 DIAGNOSIS — J440 Chronic obstructive pulmonary disease with acute lower respiratory infection: Secondary | ICD-10-CM | POA: Diagnosis not present

## 2014-12-08 DIAGNOSIS — I4891 Unspecified atrial fibrillation: Secondary | ICD-10-CM | POA: Diagnosis not present

## 2014-12-08 DIAGNOSIS — J189 Pneumonia, unspecified organism: Secondary | ICD-10-CM | POA: Diagnosis not present

## 2014-12-08 DIAGNOSIS — I2510 Atherosclerotic heart disease of native coronary artery without angina pectoris: Secondary | ICD-10-CM | POA: Diagnosis not present

## 2014-12-08 DIAGNOSIS — I10 Essential (primary) hypertension: Secondary | ICD-10-CM | POA: Diagnosis not present

## 2014-12-08 DIAGNOSIS — J441 Chronic obstructive pulmonary disease with (acute) exacerbation: Secondary | ICD-10-CM | POA: Diagnosis not present

## 2014-12-08 LAB — POCT INR: INR: 1.7

## 2014-12-08 NOTE — Progress Notes (Signed)
Cardiology Office Note   Date:  12/09/2014   ID:  [name], DOB 11/02/1935, MRN [id number]  PCP:  Irene Pap, NP  Cardiologist:  Dr. Kirk Ruths     Chief Complaint  Patient presents with  . Shortness of Breath     History of Present Illness: [name] is a 79 y.o. 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   Admitted last month with community acquired pneumonia.  He was treated with combination of antibiotics and prednisone.  He tells me he had to take another round of antibiotics for continued symptoms. Also seen by Dr. Kirk Ruths earlier this month and noted to be back in AFib.  Rate control strategy was chosen.   He called in yesterday with complaints of worsening dyspnea and elevated HR.  He returns for evaluation.  He is here with his wife and daughter-in-law. His daughter-in-law is a paramedic. She has checked his pulse several times. His heart rate can be in the 40s at 1 time and in the 150s at another.  He denies any significant weight gain or edema. He denies chest pain. He is short of breath with any type of activity. He is NYHA 3. He sleeps on 2 pillows chronically. He denies PND or increased abdominal girth. He denies syncope. He does describe episodes of dizziness. He has some orthostatic intolerance. He also has times where he feels like he is having another mini stroke. He mainly experiences blurred vision but no other symptoms. Question if he is describing near syncope.   Studies/Reports Reviewed Today: - LHC 12/06: Mid RCA 40-50%. - Echo (12/09/13): Mod LVH, EF 55-60%, no RWMA, cannot assess diast fxn, mild AI, mean MV gradient 8 mmHg, mod LAE, severe RVE, mild reduced RVSF, mild RAE, mild TR, PASP 50 mmHg (mod Pul HTN),  - Lexiscan Myoview (01/2014): Lateral soft tissue  attenuation, no ischemia, not gated, low risk  - Carotid US 04/16/14:  1-39% bilateral ICA stenosis.  f/u 2 years.   Past Medical History  Diagnosis Date  . Mediastinal lymphadenopathy   . History of prostate cancer   . Atrial fibrillation     Coumadin  . COPD (chronic obstructive pulmonary disease)   . Adrenal adenoma   . Warthin's tumor   . Dyslipidemia   . Diverticulosis   . Embolic stroke     d/t endocarditis 2008  . Atrial flutter   . Ulcerative colitis     left-sided/segmental, associated with diverticulosis.  Sulfasalazine per GI (Dr. Carlean Purl).  Marland Kitchen HTN (hypertension)   . Thrombocytopenia   . Right thyroid nodule   . Hemorrhoids   . TIA (transient ischemic attack) 03/25/2011    ? of due to transient diplopia  . CAD (coronary artery disease)     Lexiscan Myoview (01/2014): Lateral soft tissue attenuation, no ischemia, not gated, low risk  . CAP (community acquired pneumonia)     Hospitalized 10/2014  . History of mitral valve replacement     Past Surgical History  Procedure Laterality Date  . Mitral valve repair  09/22/04    and modified Cox-Maze IV   . Mitral valve repair  09/14/05    redo MV repair d/t endocardiits/embolic events  . Prostatectomy    . Inguinal hernia repair      right  . Cataract extraction, bilateral    .  Salivary gland surgery      left resection  . Flexible sigmoidoscopy  01/11/2008; 03/19/2010    2009 and 2011:segmental left colitis, diverticulosis, hemorrhoids  . Colonoscopy w/ biopsies  12/19/2007    left-sided colitis, diverticulosis, hemorrhoids  . Carotid duplex dopplers  04/2014    No signif plaques; repeat 2 yrs per cardiology  . Transthoracic echocardiogram  11/2013    Mod LVH, EF 55-60%, no WM abnormalities, +LA dilation, +severe RV dilation and impaired systolic fxn, +increased pulm art pressures.  Valves ok.     Current Outpatient Prescriptions  Medication Sig Dispense Refill  . albuterol (PROVENTIL HFA;VENTOLIN HFA) 108 (90 BASE)  MCG/ACT inhaler Inhale 2 puffs into the lungs every 6 (six) hours as needed for wheezing or shortness of breath. 3 Inhaler 3  . albuterol (PROVENTIL) (2.5 MG/3ML) 0.083% nebulizer solution Take 3 mLs (2.5 mg total) by nebulization every 6 (six) hours as needed for wheezing or shortness of breath. 75 mL 5  . diltiazem (CARDIZEM CD) 240 MG 24 hr capsule Take 1 capsule (240 mg total) by mouth daily. 90 capsule 3  . furosemide (LASIX) 40 MG tablet Take 1 tablet (40 mg total) by mouth daily as needed. 90 tablet 4  . guaiFENesin (MUCINEX) 600 MG 12 hr tablet Take 2 tablets (1,200 mg total) by mouth 2 (two) times daily. 30 tablet 0  . LORazepam (ATIVAN) 0.5 MG tablet Take 1 tablet (0.5 mg total) by mouth 2 (two) times daily as needed for anxiety. 30 tablet 1  . pravastatin (PRAVACHOL) 40 MG tablet TAKE 1 TABLET EVERY EVENING 90 tablet 3  . traMADol (ULTRAM) 50 MG tablet Take 1 tablet (50 mg total) by mouth every 8 (eight) hours as needed. 30 tablet 0  . Umeclidinium-Vilanterol (ANORO ELLIPTA) 62.5-25 MCG/INH AEPB Inhale 1 puff into the lungs daily. 60 each 3  . warfarin (COUMADIN) 5 MG tablet Take 5-7.5 mg by mouth daily. 5mg  daily and 7.5mg  on Tuesdays     No current facility-administered medications for this visit.    Allergies:   Review of patient's allergies indicates no known allergies.    Social History:  The patient  reports that he quit smoking about 12 years ago. His smoking use included Cigarettes. He has a 180 pack-year smoking history. He has never used smokeless tobacco. He reports that he does not drink alcohol or use illicit drugs.   Family History:  The patient's family history includes Cancer in his mother; Heart disease in his father; Stroke in his father. There is no history of Colon cancer or Heart attack.    ROS:   Please see the history of present illness.   Review of Systems  Cardiovascular: Positive for dyspnea on exertion, orthopnea, palpitations and paroxysmal nocturnal  dyspnea.  Respiratory: Positive for cough.   Neurological: Positive for dizziness.  Psychiatric/Behavioral: The patient is nervous/anxious.   All other systems reviewed and are negative.    PHYSICAL EXAM: VS:  BP 120/65 mmHg  Pulse 102  Ht 5\' 7"  (1.702 m)  Wt 193 lb (87.544 kg)  BMI 30.22 kg/m2    Wt Readings from Last 3 Encounters:  12/09/14 193 lb (87.544 kg)  12/03/14 193 lb (87.544 kg)  11/27/14 196 lb 9.6 oz (89.177 kg)     GEN: Well nourished, well developed, in no acute distress HEENT: normal Neck: noJVD, no masses Cardiac:  Normal S1/S2, irregularly irregular rhythm; no murmur ,  no rubs or gallops, no edema  Respiratory:  Decreased breath  sounds bilaterally, faint dry crackles at the left base, no wheezing GI: soft, nontender, nondistended, + BS MS: no deformity or atrophy Skin: warm and dry  Neuro:  CNs II-XII intact, Strength and sensation are intact Psych: Normal affect   EKG:  EKG is ordered today.  It demonstrates:   Atrial fibrillation, HR 102, rightward axis, no change from prior tracing   Recent Labs: 09/30/2014: Pro B Natriuretic peptide (BNP) 157.0* 11/04/2014: B Natriuretic Peptide 104.2* 11/11/2014: ALT 22; BUN 26*; Creatinine 1.40; Hemoglobin 16.3; Platelets 238.0; Potassium 3.6; Sodium 137    Recent Labs  12/09/13 1350  09/30/14 0808 11/04/14 1132 11/04/14 1136 11/05/14 0544 11/11/14 1012  NA 138  < > 134* 137  --  134* 137  K 3.7  < > 4.3 4.5  --  4.0 3.6  BUN 15  < > 24* 16  --  17 26*  CREATININE 1.3  < > 1.3 1.28  --  1.23 1.40  HGB  --   < > 14.8 14.7  --  14.9 16.3  BNP  --   --   --   --  104.2*  --   --   PROBNP 83.0  --  157.0*  --   --   --   --   < > = values in this interval not displayed.    Lipid Panel    Component Value Date/Time   CHOL 133 09/30/2014 0808   TRIG 69.0 09/30/2014 0808   HDL 36.80* 09/30/2014 0808   CHOLHDL 4 09/30/2014 0808   VLDL 13.8 09/30/2014 0808   LDLCALC 82 09/30/2014 0808      ASSESSMENT  AND PLAN:  SOB (shortness of breath) Etiology is not entirely clear. He does have significant COPD. He is currently on chronic O2. He mainly has chronic atrial fibrillation. It sounds as though his heart rates have been elevated recently. He's also had some slow heart rates documented. He was placed on a new long-acting beta agonist when he was released from the hospital. His breathing has been worse since that time. He does not really exhibit significant volume excess on exam. He had a low risk Myoview in the last 12 months. It has been a year since his echocardiogram was updated.    -  Labs today: Basic metabolic panel, CBC, TSH, BNP. If BNP elevated, adjust Lasix.    -  Obtain chest x-ray today.    -  Obtain 48-hour Holter to assess heart rate control and rule out significant pauses.    -  Arrange follow-up echocardiogram.    -  He sees pulmonology later today. Question if his new long-acting beta agonist can be changed to an alternate drug.  I question if this is driving his higher heart rates and leading to some of his shortness of breath.  Persistent atrial fibrillation It sounds as though he has some episodes of bradycardia and episodes of tachycardia. Heart rate is 102 today. Obtain Holter monitor as outlined above. We can make further determination on rate control depending upon the findings of his monitor.  Essential hypertension, benign Controlled.  HYPERLIPIDEMIA, FAMILIAL Continue statin.  Atherosclerosis of native coronary artery of native heart without angina pectoris No real symptoms of angina. As noted he had a low risk Myoview in May 2015.  MITRAL VALVE REPLACEMENT, HX OF Obtain follow-up echocardiogram as noted.  Pulmonary emphysema, unspecified emphysema type  Follow-up pulmonology today. Question if he can have his long-acting beta agonist changed.  Dizziness  Question if he is having episodes of tachycardia-bradycardia. Obtain monitor as noted. Obtain  TSH.   Current medicines are reviewed at length with the patient today.  The patient does not have concerns regarding medicines.  The following changes have been made:  As above.   Labs/ tests ordered today include:   Orders Placed This Encounter  Procedures  . DG Chest 2 View  . Basic Metabolic Panel (BMET)  . CBC  . TSH  . B Nat Peptide  . EKG 12-Lead  . Holter monitor - 48 hour  . 2D Echocardiogram without contrast    Disposition:   FU with Dr. Kirk Ruths 2 weeks.    Signed, Versie Starks, MHS 12/09/2014 9:26 AM    [facility] [place], [place], [place]  [address] Phone: [phone]; Fax: [phone]

## 2014-12-08 NOTE — Telephone Encounter (Signed)
PAOV today or tomorrow; if dyspnea severe, may need ER eval. Kirk Ruths

## 2014-12-08 NOTE — Telephone Encounter (Signed)
Returned call to [place] with [place] she stated she is out to see patient this morning and he is very sob hard to walk from room to room.Pulse irregular ranging from 33 to 90 bpm.Stated he took prednisone and antibiotics last week for flu.Stated she thinks patient needs to be seen.Dr.Crenshaw's schedule is full.[employer] flex schedule is full.Message sent to [facility] for advice.

## 2014-12-08 NOTE — Telephone Encounter (Signed)
[name] ( [place]) is calling because [name] is having extreme irregular heart rate anywhere from 33-90 and shortness of breath. Please call    Thanks

## 2014-12-08 NOTE — Telephone Encounter (Signed)
Spoke with pt wife, she reports he is unable to walk from the bedroom to the den without having to stop. He has no edema or orthopnea and his weight is down. They report his heart rate is all over the place and can reach 145 by wives report. He is taking his lasix daily and his cardizem is 360 mg daily. Discussed with dr Stanford Breed, pt will see the pa tomorrow. He may need 48 hour monitor to assess heart rate throughout the day. Advised to go to the ER today if his symptoms change or worsen. Patient wife voiced understanding

## 2014-12-09 ENCOUNTER — Telehealth: Payer: Self-pay | Admitting: Pulmonary Disease

## 2014-12-09 ENCOUNTER — Encounter: Payer: Self-pay | Admitting: Physician Assistant

## 2014-12-09 ENCOUNTER — Encounter: Payer: Self-pay | Admitting: Pulmonary Disease

## 2014-12-09 ENCOUNTER — Ambulatory Visit (INDEPENDENT_AMBULATORY_CARE_PROVIDER_SITE_OTHER): Payer: Medicare Other | Admitting: Physician Assistant

## 2014-12-09 ENCOUNTER — Ambulatory Visit (INDEPENDENT_AMBULATORY_CARE_PROVIDER_SITE_OTHER): Payer: Medicare Other | Admitting: Pulmonary Disease

## 2014-12-09 ENCOUNTER — Telehealth: Payer: Self-pay | Admitting: *Deleted

## 2014-12-09 ENCOUNTER — Ambulatory Visit (INDEPENDENT_AMBULATORY_CARE_PROVIDER_SITE_OTHER)
Admission: RE | Admit: 2014-12-09 | Discharge: 2014-12-09 | Disposition: A | Discharge status: Home / Self Care | Payer: Medicare Other | Source: Ambulatory Visit | Attending: Physician Assistant | Admitting: Physician Assistant

## 2014-12-09 VITALS — BP 120/65 | HR 102 | Ht 67.0 in | Wt 193.0 lb

## 2014-12-09 VITALS — BP 126/62 | HR 107 | Temp 97.2°F | Ht 67.0 in | Wt 193.0 lb

## 2014-12-09 DIAGNOSIS — I481 Persistent atrial fibrillation: Secondary | ICD-10-CM | POA: Diagnosis not present

## 2014-12-09 DIAGNOSIS — R0602 Shortness of breath: Secondary | ICD-10-CM

## 2014-12-09 DIAGNOSIS — I2510 Atherosclerotic heart disease of native coronary artery without angina pectoris: Secondary | ICD-10-CM

## 2014-12-09 DIAGNOSIS — I10 Essential (primary) hypertension: Secondary | ICD-10-CM

## 2014-12-09 DIAGNOSIS — R42 Dizziness and giddiness: Secondary | ICD-10-CM

## 2014-12-09 DIAGNOSIS — R05 Cough: Secondary | ICD-10-CM | POA: Diagnosis not present

## 2014-12-09 DIAGNOSIS — J439 Emphysema, unspecified: Secondary | ICD-10-CM

## 2014-12-09 DIAGNOSIS — E7800 Pure hypercholesterolemia, unspecified: Secondary | ICD-10-CM

## 2014-12-09 DIAGNOSIS — Z9889 Other specified postprocedural states: Secondary | ICD-10-CM

## 2014-12-09 DIAGNOSIS — I4819 Other persistent atrial fibrillation: Secondary | ICD-10-CM

## 2014-12-09 LAB — CBC
HCT: 39.8 % (ref 39.0–52.0)
Hemoglobin: 13.5 g/dL (ref 13.0–17.0)
MCHC: 33.9 g/dL (ref 30.0–36.0)
MCV: 87.3 fl (ref 78.0–100.0)
Platelets: 241 10*3/uL (ref 150.0–400.0)
RBC: 4.56 Mil/uL (ref 4.22–5.81)
RDW: 14.2 % (ref 11.5–15.5)
WBC: 10.8 10*3/uL — AB (ref 4.0–10.5)

## 2014-12-09 LAB — BASIC METABOLIC PANEL
BUN: 17 mg/dL (ref 6–23)
CO2: 30 meq/L (ref 19–32)
Calcium: 9.4 mg/dL (ref 8.4–10.5)
Chloride: 99 mEq/L (ref 96–112)
Creatinine, Ser: 1.41 mg/dL (ref 0.40–1.50)
GFR: 51.8 mL/min — AB (ref >60.00)
GLUCOSE: 115 mg/dL — AB (ref 70–99)
POTASSIUM: 3.9 meq/L (ref 3.5–5.1)
SODIUM: 134 meq/L — AB (ref 135–145)

## 2014-12-09 LAB — TSH: TSH: 3.24 u[IU]/mL (ref 0.35–4.50)

## 2014-12-09 LAB — BRAIN NATRIURETIC PEPTIDE: Pro B Natriuretic peptide (BNP): 48 pg/mL (ref 0.0–100.0)

## 2014-12-09 MED ORDER — LEVALBUTEROL HCL 0.63 MG/3ML IN NEBU: 0.6300 mg | INHALATION_SOLUTION | Freq: Four times a day (QID) | RESPIRATORY_TRACT | Status: DC

## 2014-12-09 MED ORDER — BUDESONIDE 0.5 MG/2ML IN SUSP: 0.5000 mg | Freq: Two times a day (BID) | RESPIRATORY_TRACT | Status: DC

## 2014-12-09 MED ORDER — LEVALBUTEROL TARTRATE 45 MCG/ACT IN AERO: 2.0000 | INHALATION_SPRAY | RESPIRATORY_TRACT | Status: DC | PRN

## 2014-12-09 NOTE — Telephone Encounter (Signed)
pt asked for to s/w wife about results. Wife notified about results and to increase lasix to 40 mg BID x 3 days then resume current dose. BMET 3/29.Wife verbalized plan of care.

## 2014-12-09 NOTE — Telephone Encounter (Signed)
Pt wife calling stating non of the 3 meds order at today's ov will be covered by [facility] per [facility], [phone]

## 2014-12-09 NOTE — Addendum Note (Signed)
Addended by: Inge Rise on: 12/09/2014 12:49 PM   Modules accepted: Orders

## 2014-12-09 NOTE — Telephone Encounter (Signed)
Called and spoke to [employer] at [employer]. Tech stated pt's xopenex inhaler and budesonide are covered but the xopenex neb solution is no covered, albuterol is preferred. Called and informed pt's wife.   [place] please advise if you want PA done for Xopenex neb.

## 2014-12-09 NOTE — Telephone Encounter (Signed)
He has to be on xopenex because of afib and rapid heart rate.

## 2014-12-09 NOTE — Assessment & Plan Note (Signed)
The patient has known moderate COPD, but currently is having significant breathing issues. This is complicated by his atrial fibrillation with rapid ventricular response, and cardiology has asked if we can change his medication to minimize its impact to his heart rate. I will change his beta agonist over to Xopenex by nebulization, and will try adding budesonide as well. He has been tried on all the different combinations of bronchodilators with no significant response. Hopefully, if we can keep his heart rate controlled, his exertional tolerance and breathing will return to his prior baseline. Only has clear lung fields with no evidence for bronchospasm.

## 2014-12-09 NOTE — Patient Instructions (Signed)
Stop all of your current breathing medications Will start on xopenex 0.63mg  by nebulizer at breakfast, lunch, dinner, and bedtime.  Add budesonide 0.5mg  to two treatments a day xopenex inhaler, 2 puffs as needed when away from home only for rescue. Will check oxygen level overnight on oxygen to make sure is enough Will refer to your home care company to look at different sources of portable oxygen  followup with me again in 62mos, but call if having issues before the next visit.

## 2014-12-09 NOTE — Patient Instructions (Signed)
Your physician recommends that you schedule a follow-up appointment in: 2 weeks with Dr. Cynda Acres  Your physician recommends that you continue on your current medications as directed. Please refer to the Current Medication list given to you today.  Your physician recommends that you return for lab work TSH/CBC/BNP/BMP  Your physician has recommended that you wear a holter monitor 48 HOURS. Holter monitors are medical devices that record the heart's electrical activity. Doctors most often use these monitors to diagnose arrhythmias. Arrhythmias are problems with the speed or rhythm of the heartbeat. The monitor is a small, portable device. You can wear one while you do your normal daily activities. This is usually used to diagnose what is causing palpitations/syncope (passing out).  Your physician has requested that you have an echocardiogram. Echocardiography is a painless test that uses sound waves to create images of your heart. It provides your doctor with information about the size and shape of your heart and how well your heart's chambers and valves are working. This procedure takes approximately one hour. There are no restrictions for this procedure.  A chest x-ray takes a picture of the organs and structures inside the chest, including the heart, lungs, and blood vessels. This test can show several things, including, whether the heart is enlarges; whether fluid is building up in the lungs; and whether pacemaker / defibrillator leads are still in place.  Thank you for choosing [facility]!!

## 2014-12-09 NOTE — Progress Notes (Signed)
   Subjective:    Patient ID: [name], male    DOB: 1936/03/02, 79 y.o.   MRN: [id number]  HPI Patient comes in today for follow-up of his known moderate COPD. Since last visit, he has had an episode of community-acquired pneumonia with a good response to antibiotics. He then developed issues with his atrial fibrillation and a rapid ventricular response. Cardiology has asked if we could change his pulmonary medicines to try and minimize impact to his rate control. The patient denies any cough, chest congestion, or mucus. He feels that his shortness of breath is not at baseline, but continues to be in A. fib with an elevated heart rate. The patient has been tried on numerous bronchodilator regimens in the past, with no change in his breathing.   Review of Systems  Constitutional: Negative for fever, chills, activity change, appetite change and unexpected weight change.  HENT: Negative for congestion, dental problem, ear pain, nosebleeds, postnasal drip, rhinorrhea, sinus pressure, sneezing, sore throat, trouble swallowing and voice change.   Eyes: Negative for redness, itching and visual disturbance.  Respiratory: Positive for shortness of breath. Negative for cough, choking, chest tightness and wheezing.   Cardiovascular: Negative for chest pain, palpitations and leg swelling.  Gastrointestinal: Negative for nausea, vomiting and abdominal pain.  Genitourinary: Negative for dysuria and difficulty urinating.  Musculoskeletal: Negative for joint swelling and arthralgias.  Skin: Negative for rash.  Neurological: Negative for headaches.  Hematological: Does not bruise/bleed easily.  Psychiatric/Behavioral: Negative for behavioral problems, confusion and dysphoric mood. The patient is not nervous/anxious.        Objective:   Physical Exam Obese male in no acute distress Nose without purulence or discharge noted Neck without lymphadenopathy or thyromegaly Chest with mild decrease in  breath sounds, but otherwise excellent airflow with no wheezing.  Mild basilar crackles noted Cardiac exam with irregular rhythm and elevated ventricular response Lower extremities with mild edema, no cyanosis Alert and oriented, moves all 4 extremities.       Assessment & Plan:

## 2014-12-10 NOTE — Telephone Encounter (Signed)
Noted  

## 2014-12-10 NOTE — Telephone Encounter (Signed)
[place], states issues has been resolved

## 2014-12-10 NOTE — Telephone Encounter (Signed)
Called and spoke to tech at [employer]. Med does not necessarily need a PA but medicare is not reimbursing pharmacy for [place] only reimbursing the pharmacy at what albuterol would cost--the xopenex will be a $500 loss with each fill the pt needs. Because pt has afib and he is unable to take albuterol-tech is going to fax [place] the form and see if we are able to provide the pt's insurance with the information stating he needs the xopenex to allow for a high reimbursement to pharmacy. The medication will cost pt $80.   Will await fax--hold in triage.

## 2014-12-15 ENCOUNTER — Ambulatory Visit (INDEPENDENT_AMBULATORY_CARE_PROVIDER_SITE_OTHER): Payer: Medicare Other | Admitting: Internal Medicine

## 2014-12-15 ENCOUNTER — Telehealth: Payer: Self-pay | Admitting: Pulmonary Disease

## 2014-12-15 DIAGNOSIS — I10 Essential (primary) hypertension: Secondary | ICD-10-CM | POA: Diagnosis not present

## 2014-12-15 DIAGNOSIS — J189 Pneumonia, unspecified organism: Secondary | ICD-10-CM | POA: Diagnosis not present

## 2014-12-15 DIAGNOSIS — I2510 Atherosclerotic heart disease of native coronary artery without angina pectoris: Secondary | ICD-10-CM | POA: Diagnosis not present

## 2014-12-15 DIAGNOSIS — J441 Chronic obstructive pulmonary disease with (acute) exacerbation: Secondary | ICD-10-CM | POA: Diagnosis not present

## 2014-12-15 DIAGNOSIS — I4891 Unspecified atrial fibrillation: Secondary | ICD-10-CM | POA: Diagnosis not present

## 2014-12-15 DIAGNOSIS — J439 Emphysema, unspecified: Secondary | ICD-10-CM

## 2014-12-15 DIAGNOSIS — I4819 Other persistent atrial fibrillation: Secondary | ICD-10-CM

## 2014-12-15 DIAGNOSIS — J440 Chronic obstructive pulmonary disease with acute lower respiratory infection: Secondary | ICD-10-CM | POA: Diagnosis not present

## 2014-12-15 DIAGNOSIS — I481 Persistent atrial fibrillation: Secondary | ICD-10-CM

## 2014-12-15 DIAGNOSIS — Z5181 Encounter for therapeutic drug level monitoring: Secondary | ICD-10-CM

## 2014-12-15 LAB — POCT INR: INR: 2.5

## 2014-12-15 NOTE — Telephone Encounter (Signed)
LMTCB

## 2014-12-15 NOTE — Telephone Encounter (Signed)
Well when a pt has medicare and is using a dme for 02 it caps out and the 02 has to stay with that dme for 47yrs i don't know if ahc can even transfer the 02 over to them [place]

## 2014-12-15 NOTE — Telephone Encounter (Signed)
Spoke with the pt's spouse  She states that pt has been in a company called [employer] for noct o2  He now needs daytime o2 since recent hospital d/c  They wanted to switch over to [facility] but were advised that they can not due to insurance  Will forward to [facility] to see what the issue is  Thanks

## 2014-12-16 ENCOUNTER — Ambulatory Visit (HOSPITAL_COMMUNITY): Payer: Medicare Other | Attending: Cardiology | Admitting: Cardiology

## 2014-12-16 ENCOUNTER — Encounter (INDEPENDENT_AMBULATORY_CARE_PROVIDER_SITE_OTHER): Payer: Medicare Other

## 2014-12-16 ENCOUNTER — Encounter: Payer: Self-pay | Admitting: *Deleted

## 2014-12-16 DIAGNOSIS — Z87891 Personal history of nicotine dependence: Secondary | ICD-10-CM | POA: Diagnosis not present

## 2014-12-16 DIAGNOSIS — I4819 Other persistent atrial fibrillation: Secondary | ICD-10-CM

## 2014-12-16 DIAGNOSIS — E785 Hyperlipidemia, unspecified: Secondary | ICD-10-CM | POA: Insufficient documentation

## 2014-12-16 DIAGNOSIS — I10 Essential (primary) hypertension: Secondary | ICD-10-CM | POA: Diagnosis not present

## 2014-12-16 DIAGNOSIS — R0602 Shortness of breath: Secondary | ICD-10-CM | POA: Diagnosis not present

## 2014-12-16 DIAGNOSIS — I481 Persistent atrial fibrillation: Secondary | ICD-10-CM

## 2014-12-16 NOTE — Progress Notes (Signed)
HPI: FU MV repair, AFib/flutter, s/p MAZE procedure, nonobstructive CAD, prior embolic CVA after initial MV repair secondary to endocarditis requiring redo surgery (repair), diastolic CHF, COPD, HL, HTN.patient seen recently for increased dyspnea. Chest x-ray showed mild CHF and bibasilar atelectasis. BNP 48. Echocardiogram March 2016 showed normal LV function, mild aortic insufficiency, moderate left atrial enlargement, mild right atrial and right ventricular enlargement and moderately elevated pulmonary pressures. Holter monitor revealed atrial flutter with mildly elevated rate. Since last seen, he complains of significant dyspnea on exertion. Also with orthopnea but no PND. No pedal edema, chest pain or syncope.  Studies: - LHC 12/06: Mid RCA 40-50%. Carlton Adam Myoview (01/2014): Lateral soft tissue attenuation, no ischemia, not gated, low risk - Carotid Dopplers August 2015 showed 1-39% bilateral stenosis and follow-up recommended in 2 years.  Current Outpatient Prescriptions  Medication Sig Dispense Refill  . budesonide (PULMICORT) 0.5 MG/2ML nebulizer solution Take 0.5 mg by nebulization 2 (two) times daily.    Marland Kitchen diltiazem (CARDIZEM CD) 240 MG 24 hr capsule Take 1 capsule (240 mg total) by mouth daily. 90 capsule 3  . diltiazem (CARDIZEM) 120 MG tablet Take 1 tablet by mouth daily.    . furosemide (LASIX) 40 MG tablet Take 1 tablet (40 mg total) by mouth daily as needed. (Patient taking differently: Take 40 mg by mouth daily. ) 90 tablet 4  . levalbuterol (XOPENEX HFA) 45 MCG/ACT inhaler Inhale 2 puffs into the lungs every 4 (four) hours as needed for wheezing. 1 Inhaler 12  . levalbuterol (XOPENEX) 0.63 MG/3ML nebulizer solution Take 3 mLs (0.63 mg total) by nebulization 4 (four) times daily. DX J43.9 360 mL 6  . LORazepam (ATIVAN) 0.5 MG tablet Take 1 tablet (0.5 mg total) by mouth 2 (two) times daily as needed for anxiety. 30 tablet 1  . pravastatin (PRAVACHOL) 40 MG tablet TAKE  1 TABLET EVERY EVENING 90 tablet 3  . traMADol (ULTRAM) 50 MG tablet Take 1 tablet (50 mg total) by mouth every 8 (eight) hours as needed. 30 tablet 0  . warfarin (COUMADIN) 5 MG tablet Take 5-7.5 mg by mouth daily. 5mg  daily and 7.5mg  on Tuesdays    . digoxin (LANOXIN) 0.125 MG tablet Take 1 tablet (0.125 mg total) by mouth daily. 90 tablet 3   No current facility-administered medications for this visit.     Past Medical History  Diagnosis Date  . Mediastinal lymphadenopathy   . History of prostate cancer   . Atrial fibrillation     Coumadin  . COPD (chronic obstructive pulmonary disease)   . Adrenal adenoma   . Warthin's tumor   . Dyslipidemia   . Diverticulosis   . Embolic stroke     d/t endocarditis 2008  . Atrial flutter   . Ulcerative colitis     left-sided/segmental, associated with diverticulosis.  Sulfasalazine per GI (Dr. Carlean Purl).  Marland Kitchen HTN (hypertension)   . Thrombocytopenia   . Right thyroid nodule   . Hemorrhoids   . TIA (transient ischemic attack) 03/25/2011    ? of due to transient diplopia  . CAD (coronary artery disease)     Lexiscan Myoview (01/2014): Lateral soft tissue attenuation, no ischemia, not gated, low risk  . CAP (community acquired pneumonia)     Hospitalized 10/2014  . History of mitral valve replacement     Past Surgical History  Procedure Laterality Date  . Mitral valve repair  09/22/04    and modified Cox-Maze IV   .  Mitral valve repair  09/14/05    redo MV repair d/t endocardiits/embolic events  . Prostatectomy    . Inguinal hernia repair      right  . Cataract extraction, bilateral    . Salivary gland surgery      left resection  . Flexible sigmoidoscopy  01/11/2008; 03/19/2010    2009 and 2011:segmental left colitis, diverticulosis, hemorrhoids  . Colonoscopy w/ biopsies  12/19/2007    left-sided colitis, diverticulosis, hemorrhoids  . Carotid duplex dopplers  04/2014    No signif plaques; repeat 2 yrs per cardiology  . Transthoracic  echocardiogram  11/2013    Mod LVH, EF 55-60%, no WM abnormalities, +LA dilation, +severe RV dilation and impaired systolic fxn, +increased pulm art pressures.  Valves ok.    History   Social History  . Marital Status: Married    Spouse Name: Jeanett Schlein  . Number of Children: 2  . Years of Education: N/A   Occupational History  . RETIRED     [employer]   .     Social History Main Topics  . Smoking status: Former Smoker -- 3.00 packs/day for 60 years    Types: Cigarettes    Quit date: 09/19/2002  . Smokeless tobacco: Never Used  . Alcohol Use: No  . Drug Use: No  . Sexual Activity: Not on file   Other Topics Concern  . Not on file   Social History Narrative   Pt has children    ROS: no fevers or chills, productive cough, hemoptysis, dysphasia, odynophagia, melena, hematochezia, dysuria, hematuria, rash, seizure activity, claudication. Remaining systems are negative.  Physical Exam: Well-developed well-nourished in no acute distress.  Skin is warm and dry.  HEENT is normal.  Neck is supple.  Chest minimal basilar crackles Cardiovascular exam is irregular Abdominal exam nontender or distended. No masses palpated. Extremities show no edema. neuro grossly intact

## 2014-12-16 NOTE — Telephone Encounter (Signed)
Spoke to the pt's wife. She would like to talk to a [facility] about this issue.  [facility] - please advise. Thanks.

## 2014-12-16 NOTE — Progress Notes (Signed)
Patient ID: [name], male   DOB: May 21, 1936, 79 y.o.   MRN: [id number] [employer] 48 hour holter monitor applied to patient.

## 2014-12-16 NOTE — Progress Notes (Signed)
Echo performed. 

## 2014-12-16 NOTE — Telephone Encounter (Signed)
LMTC x 1  

## 2014-12-17 ENCOUNTER — Telehealth: Payer: Self-pay | Admitting: *Deleted

## 2014-12-17 NOTE — Telephone Encounter (Signed)
[name] returned call- cb # [phone] Pt has caped out. Pt has to stay with [facility] for a few more months.

## 2014-12-17 NOTE — Telephone Encounter (Addendum)
Spoke with [facility], states that they cannot take the patient on for O2 purposes as the patient has a contract with [facility] for 5 years.  Pt's insurance is capped out and if switched to [facility], they would not receive any money from the insurance to pay for the O2 they supply to the patient.  Per [employer], insurance caps out at 36 months (36 payments) and once these payments are made, nothing further is owed on O2, patient is however tied to DME for the entire 5 year period.   Spoke with patient wife to make aware. Patient wife would like to speak with [facility] regarding this situation, states that she does not understand, states that they were unaware of any type of "committment" within the contract when they signed up with [facility].   Please advise [facility]. Thanks.

## 2014-12-17 NOTE — Telephone Encounter (Signed)
pt's wife notified of echo results ; DPR on file..Wife aware to keep appt with Dr. Stanford Breed 4/4, wife said ok and thank you.

## 2014-12-18 NOTE — Telephone Encounter (Signed)
New 02 start ordered for [facility].  Nothing further needed.

## 2014-12-18 NOTE — Telephone Encounter (Signed)
Dawne, just to clarify, do we need to send an order to [facility]?  If so for what?  Thanks!

## 2014-12-18 NOTE — Telephone Encounter (Signed)
We need an Order placed for 02 for this pt as he needs to get re-established with [facility].  [facility] took the pt for 02 in error and they need to pick up their tanks.  Order also needs to state liter flow for pt.

## 2014-12-18 NOTE — Telephone Encounter (Signed)
Pt's wife was in the bldg for another appt & came by to see [name], who is on a conference call, regarding her husband's 02.  I spoke with his wife and advised that pt will need to go back with [facility] due to his ins being capped.  Spoke with [name] @[facility]  and she is aware that pt needs to re-establish with them.  [name] requested we send an Order to them to get pt set up.  I advised pt's wife that once he is re-established with [facility] then [facility] can come pick up the 02 they brought to him.  Pt's wife voiced understanding [name]

## 2014-12-19 ENCOUNTER — Ambulatory Visit: Payer: Medicare Other | Admitting: Nurse Practitioner

## 2014-12-22 ENCOUNTER — Ambulatory Visit (INDEPENDENT_AMBULATORY_CARE_PROVIDER_SITE_OTHER): Payer: Medicare Other | Admitting: Cardiology

## 2014-12-22 ENCOUNTER — Encounter: Payer: Self-pay | Admitting: Cardiology

## 2014-12-22 ENCOUNTER — Encounter: Payer: Self-pay | Admitting: Internal Medicine

## 2014-12-22 ENCOUNTER — Encounter: Payer: Self-pay | Admitting: *Deleted

## 2014-12-22 VITALS — BP 120/60 | HR 112 | Ht 66.0 in | Wt 194.5 lb

## 2014-12-22 DIAGNOSIS — I484 Atypical atrial flutter: Secondary | ICD-10-CM | POA: Diagnosis not present

## 2014-12-22 DIAGNOSIS — Z9889 Other specified postprocedural states: Secondary | ICD-10-CM

## 2014-12-22 DIAGNOSIS — I481 Persistent atrial fibrillation: Secondary | ICD-10-CM

## 2014-12-22 DIAGNOSIS — I10 Essential (primary) hypertension: Secondary | ICD-10-CM | POA: Diagnosis not present

## 2014-12-22 DIAGNOSIS — I4819 Other persistent atrial fibrillation: Secondary | ICD-10-CM

## 2014-12-22 DIAGNOSIS — I2510 Atherosclerotic heart disease of native coronary artery without angina pectoris: Secondary | ICD-10-CM

## 2014-12-22 DIAGNOSIS — R0600 Dyspnea, unspecified: Secondary | ICD-10-CM

## 2014-12-22 DIAGNOSIS — R0602 Shortness of breath: Secondary | ICD-10-CM

## 2014-12-22 DIAGNOSIS — I679 Cerebrovascular disease, unspecified: Secondary | ICD-10-CM

## 2014-12-22 MED ORDER — DIGOXIN 125 MCG PO TABS: 0.1250 mg | ORAL_TABLET | Freq: Every day | ORAL | Status: DC

## 2014-12-22 NOTE — Assessment & Plan Note (Signed)
Etiology unclear. Most likely multifactorial including deconditioning, COPD and diastolic congestive heart failure. His heart rate is mildly elevated and I have added digoxin and hopefully improved rate control will help with his dyspnea. I will schedule a right heart catheterization to evaluate pulmonary pressures and left heart pressures. If pulmonary capillary wedge pressure elevated I will increase Lasix. He does not appear to be significantly volume overloaded on examination. Continue present dose of Lasix at present. Discontinue Coumadin 5 days prior to his procedure and he will need a Lovenox bridge which range. Check BNP.

## 2014-12-22 NOTE — Assessment & Plan Note (Signed)
Patient remains in atrial flutter. His monitor shows his heart rate is mildly elevated. Add digoxin 0.125 mg daily. Continue Cardizem and Coumadin.

## 2014-12-22 NOTE — Assessment & Plan Note (Signed)
Continue SBE prophylaxis. 

## 2014-12-22 NOTE — Assessment & Plan Note (Signed)
Blood pressure controlled. Continue present medications. 

## 2014-12-22 NOTE — Patient Instructions (Signed)
Your physician has requested that you have a cardiac catheterization. Cardiac catheterization is used to diagnose and/or treat various heart conditions. Doctors may recommend this procedure for a number of different reasons. The most common reason is to evaluate chest pain. Chest pain can be a symptom of coronary artery disease (CAD), and cardiac catheterization can show whether plaque is narrowing or blocking your heart's arteries. This procedure is also used to evaluate the valves, as well as measure the blood flow and oxygen levels in different parts of your heart. For further information please visit [email or web address]. Please follow instruction sheet, as given.   Your physician recommends that you return for LAB WORK PRIOR TO PROCEDURE  START DIGOXIN 0.125 MG ONCE DAILY

## 2014-12-22 NOTE — Assessment & Plan Note (Signed)
Follow-up carotid Dopplers August 2017.

## 2014-12-23 ENCOUNTER — Telehealth: Payer: Self-pay | Admitting: Cardiology

## 2014-12-23 ENCOUNTER — Telehealth: Payer: Self-pay

## 2014-12-23 ENCOUNTER — Other Ambulatory Visit: Payer: Self-pay | Admitting: Cardiology

## 2014-12-23 DIAGNOSIS — R0600 Dyspnea, unspecified: Secondary | ICD-10-CM

## 2014-12-23 DIAGNOSIS — J441 Chronic obstructive pulmonary disease with (acute) exacerbation: Secondary | ICD-10-CM | POA: Diagnosis not present

## 2014-12-23 DIAGNOSIS — I4891 Unspecified atrial fibrillation: Secondary | ICD-10-CM | POA: Diagnosis not present

## 2014-12-23 DIAGNOSIS — I10 Essential (primary) hypertension: Secondary | ICD-10-CM | POA: Diagnosis not present

## 2014-12-23 DIAGNOSIS — I2510 Atherosclerotic heart disease of native coronary artery without angina pectoris: Secondary | ICD-10-CM | POA: Diagnosis not present

## 2014-12-23 DIAGNOSIS — J440 Chronic obstructive pulmonary disease with acute lower respiratory infection: Secondary | ICD-10-CM | POA: Diagnosis not present

## 2014-12-23 DIAGNOSIS — J189 Pneumonia, unspecified organism: Secondary | ICD-10-CM | POA: Diagnosis not present

## 2014-12-23 NOTE — Telephone Encounter (Signed)
Spoke with [name], Aware of dr Jacalyn Lefevre recommendations.

## 2014-12-23 NOTE — Telephone Encounter (Signed)
Shaun ( [place]) is calling about the Diogoxin , which he was given on yesterday , but today his heart rate is below 60.. Wants to know should he hold it or what . Has not had a dose yet , supposed to pick it today after 2pm. Please call    Thanks

## 2014-12-23 NOTE — Telephone Encounter (Signed)
Received a call from [facility] with [place].She stated when she went out to see patient this morning his pulse ox revealed pulse to be in the 40's,and he did not take digoxin.When she checked pulse manually pulse 64.Stated she needed parameters when to hold digoxin.Message sent to [place].

## 2014-12-23 NOTE — Telephone Encounter (Signed)
Hold digoxin for heart rate less than 60. Kirk Ruths

## 2014-12-23 NOTE — Telephone Encounter (Signed)
Returned call to [place] with [place] no answer.[place].

## 2014-12-24 NOTE — Telephone Encounter (Signed)
Closed encounter °

## 2014-12-25 ENCOUNTER — Other Ambulatory Visit: Payer: Self-pay | Admitting: Cardiology

## 2014-12-25 ENCOUNTER — Telehealth: Payer: Self-pay | Admitting: Pharmacist Clinician (PhC)/ Clinical Pharmacy Specialist

## 2014-12-25 MED ORDER — ENOXAPARIN SODIUM 80 MG/0.8ML ~~LOC~~ SOLN: 80.0000 mg | Freq: Two times a day (BID) | SUBCUTANEOUS | Status: DC

## 2014-12-25 NOTE — Telephone Encounter (Signed)
Spoke with patient and wife - patient needs lovenox bridge for heart cath scheduled for April 14.  Gave information below, wife repeated everything back correctly.  Rx sent to [facility]  Enoxaprin Dosing Schedule  Enoxparin dose: 80 mg  Date  Warfarin Dose (evenings) Enoxaprin Dose  4-8 6 5  mg   4-9 5 0   4-10 4 0 8am  8pm  4-11 3 0 8am  8pm  4-12 2 0 8am  8pm  4-13 1 0 8am  4-14 Procedure 7.5 mg    4-15 1 7.5 mg 8am  8pm  4-16 2 7.5 mg 8am  8pm  4-17 3 7.5 mg 8am  8pm  4-18 4 7.5 mg 8am  8pm  4-19 5 5  mg 8am  8pm  4-20 6 Repeat INR 12:00

## 2014-12-29 ENCOUNTER — Ambulatory Visit (INDEPENDENT_AMBULATORY_CARE_PROVIDER_SITE_OTHER): Payer: Medicare Other | Admitting: Internal Medicine

## 2014-12-29 DIAGNOSIS — J441 Chronic obstructive pulmonary disease with (acute) exacerbation: Secondary | ICD-10-CM | POA: Diagnosis not present

## 2014-12-29 DIAGNOSIS — Z8673 Personal history of transient ischemic attack (TIA), and cerebral infarction without residual deficits: Secondary | ICD-10-CM | POA: Diagnosis not present

## 2014-12-29 DIAGNOSIS — E780 Pure hypercholesterolemia: Secondary | ICD-10-CM | POA: Diagnosis not present

## 2014-12-29 DIAGNOSIS — Z5181 Encounter for therapeutic drug level monitoring: Secondary | ICD-10-CM

## 2014-12-29 DIAGNOSIS — J189 Pneumonia, unspecified organism: Secondary | ICD-10-CM | POA: Diagnosis not present

## 2014-12-29 DIAGNOSIS — I4819 Other persistent atrial fibrillation: Secondary | ICD-10-CM

## 2014-12-29 DIAGNOSIS — I481 Persistent atrial fibrillation: Secondary | ICD-10-CM

## 2014-12-29 DIAGNOSIS — Z7901 Long term (current) use of anticoagulants: Secondary | ICD-10-CM | POA: Diagnosis not present

## 2014-12-29 DIAGNOSIS — I2510 Atherosclerotic heart disease of native coronary artery without angina pectoris: Secondary | ICD-10-CM | POA: Diagnosis not present

## 2014-12-29 DIAGNOSIS — J440 Chronic obstructive pulmonary disease with acute lower respiratory infection: Secondary | ICD-10-CM | POA: Diagnosis not present

## 2014-12-29 DIAGNOSIS — I10 Essential (primary) hypertension: Secondary | ICD-10-CM | POA: Diagnosis not present

## 2014-12-29 DIAGNOSIS — I4891 Unspecified atrial fibrillation: Secondary | ICD-10-CM | POA: Diagnosis not present

## 2014-12-29 LAB — POCT INR: INR: 1.8

## 2014-12-30 ENCOUNTER — Encounter: Payer: Self-pay | Admitting: Cardiology

## 2015-01-01 ENCOUNTER — Encounter (HOSPITAL_COMMUNITY)
Admission: RE | Disposition: A | Discharge status: Home / Self Care | Payer: Self-pay | Source: Ambulatory Visit | Attending: Cardiology

## 2015-01-01 ENCOUNTER — Ambulatory Visit (HOSPITAL_COMMUNITY)
Admission: RE | Admit: 2015-01-01 | Discharge: 2015-01-01 | Disposition: A | Discharge status: Home / Self Care | Payer: Medicare Other | Source: Ambulatory Visit | Attending: Cardiology | Admitting: Cardiology

## 2015-01-01 ENCOUNTER — Encounter: Payer: Self-pay | Admitting: Cardiology

## 2015-01-01 ENCOUNTER — Encounter (HOSPITAL_COMMUNITY): Payer: Self-pay | Admitting: Cardiology

## 2015-01-01 DIAGNOSIS — I10 Essential (primary) hypertension: Secondary | ICD-10-CM | POA: Insufficient documentation

## 2015-01-01 DIAGNOSIS — I272 Other secondary pulmonary hypertension: Secondary | ICD-10-CM | POA: Insufficient documentation

## 2015-01-01 DIAGNOSIS — Z87891 Personal history of nicotine dependence: Secondary | ICD-10-CM | POA: Insufficient documentation

## 2015-01-01 DIAGNOSIS — Z952 Presence of prosthetic heart valve: Secondary | ICD-10-CM | POA: Insufficient documentation

## 2015-01-01 DIAGNOSIS — R0602 Shortness of breath: Secondary | ICD-10-CM | POA: Diagnosis not present

## 2015-01-01 DIAGNOSIS — I5030 Unspecified diastolic (congestive) heart failure: Secondary | ICD-10-CM | POA: Diagnosis not present

## 2015-01-01 DIAGNOSIS — Z8546 Personal history of malignant neoplasm of prostate: Secondary | ICD-10-CM | POA: Insufficient documentation

## 2015-01-01 DIAGNOSIS — J449 Chronic obstructive pulmonary disease, unspecified: Secondary | ICD-10-CM | POA: Insufficient documentation

## 2015-01-01 DIAGNOSIS — I2510 Atherosclerotic heart disease of native coronary artery without angina pectoris: Secondary | ICD-10-CM | POA: Insufficient documentation

## 2015-01-01 DIAGNOSIS — I4891 Unspecified atrial fibrillation: Secondary | ICD-10-CM | POA: Diagnosis present

## 2015-01-01 DIAGNOSIS — J439 Emphysema, unspecified: Secondary | ICD-10-CM | POA: Diagnosis present

## 2015-01-01 DIAGNOSIS — I482 Chronic atrial fibrillation: Secondary | ICD-10-CM | POA: Diagnosis not present

## 2015-01-01 DIAGNOSIS — Z7901 Long term (current) use of anticoagulants: Secondary | ICD-10-CM | POA: Diagnosis not present

## 2015-01-01 DIAGNOSIS — Z9889 Other specified postprocedural states: Secondary | ICD-10-CM

## 2015-01-01 DIAGNOSIS — R0600 Dyspnea, unspecified: Secondary | ICD-10-CM | POA: Diagnosis present

## 2015-01-01 DIAGNOSIS — Z8673 Personal history of transient ischemic attack (TIA), and cerebral infarction without residual deficits: Secondary | ICD-10-CM | POA: Diagnosis not present

## 2015-01-01 DIAGNOSIS — E785 Hyperlipidemia, unspecified: Secondary | ICD-10-CM | POA: Diagnosis not present

## 2015-01-01 HISTORY — PX: RIGHT HEART CATHETERIZATION: SHX5447

## 2015-01-01 LAB — CBC
HCT: 39.8 % (ref 39.0–52.0)
HEMOGLOBIN: 13 g/dL (ref 13.0–17.0)
MCH: 29.3 pg (ref 26.0–34.0)
MCHC: 32.7 g/dL (ref 30.0–36.0)
MCV: 89.8 fL (ref 78.0–100.0)
Platelets: 158 10*3/uL (ref 150–400)
RBC: 4.43 MIL/uL (ref 4.22–5.81)
RDW: 14.5 % (ref 11.5–15.5)
WBC: 7.5 10*3/uL (ref 4.0–10.5)

## 2015-01-01 LAB — BASIC METABOLIC PANEL
Anion gap: 8 (ref 5–15)
BUN: 10 mg/dL (ref 6–23)
CHLORIDE: 104 mmol/L (ref 96–112)
CO2: 28 mmol/L (ref 19–32)
CREATININE: 1.19 mg/dL (ref 0.50–1.35)
Calcium: 9.5 mg/dL (ref 8.4–10.5)
GFR calc non Af Amer: 57 mL/min — ABNORMAL LOW (ref >90)
GFR, EST AFRICAN AMERICAN: 66 mL/min — AB (ref >90)
GLUCOSE: 127 mg/dL — AB (ref 70–99)
Potassium: 3.5 mmol/L (ref 3.5–5.1)
Sodium: 140 mmol/L (ref 135–145)

## 2015-01-01 LAB — POCT I-STAT 3, VENOUS BLOOD GAS (G3P V)
Acid-base deficit: 1 mmol/L (ref 0.0–2.0)
Bicarbonate: 24.4 mEq/L — ABNORMAL HIGH (ref 20.0–24.0)
O2 Saturation: 66 %
PCO2 VEN: 41.8 mmHg — AB (ref 45.0–50.0)
TCO2: 26 mmol/L (ref 0–100)
pH, Ven: 7.375 — ABNORMAL HIGH (ref 7.250–7.300)
pO2, Ven: 35 mmHg (ref 30.0–45.0)

## 2015-01-01 LAB — PROTIME-INR
INR: 1.15 (ref 0.00–1.49)
PROTHROMBIN TIME: 14.8 s (ref 11.6–15.2)

## 2015-01-01 SURGERY — RIGHT HEART CATH

## 2015-01-01 MED ORDER — SODIUM CHLORIDE 0.9 % IV SOLN: 1.0000 mL/kg/h | INTRAVENOUS | Status: DC

## 2015-01-01 MED ORDER — FENTANYL CITRATE 0.05 MG/ML IJ SOLN
INTRAMUSCULAR | Status: AC
  Filled 2015-01-01: qty 2

## 2015-01-01 MED ORDER — LIDOCAINE HCL (PF) 1 % IJ SOLN
INTRAMUSCULAR | Status: AC
  Filled 2015-01-01: qty 30

## 2015-01-01 MED ORDER — MIDAZOLAM HCL 2 MG/2ML IJ SOLN
INTRAMUSCULAR | Status: AC
  Filled 2015-01-01: qty 2

## 2015-01-01 MED ORDER — SODIUM CHLORIDE 0.9 % IV SOLN
250.0000 mL | INTRAVENOUS | Status: DC | PRN
Start: 2015-01-01 — End: 2015-01-01

## 2015-01-01 MED ORDER — SODIUM CHLORIDE 0.9 % IJ SOLN: 3.0000 mL | Freq: Two times a day (BID) | INTRAMUSCULAR | Status: DC

## 2015-01-01 MED ORDER — HEPARIN (PORCINE) IN NACL 2-0.9 UNIT/ML-% IJ SOLN
INTRAMUSCULAR | Status: AC
  Filled 2015-01-01: qty 500

## 2015-01-01 MED ORDER — ASPIRIN 81 MG PO CHEW: 81.0000 mg | CHEWABLE_TABLET | ORAL | Status: DC

## 2015-01-01 MED ORDER — SODIUM CHLORIDE 0.9 % IJ SOLN: 3.0000 mL | INTRAMUSCULAR | Status: DC | PRN

## 2015-01-01 MED ORDER — SODIUM CHLORIDE 0.9 % IV SOLN
INTRAVENOUS | Status: DC
  Administered 2015-01-01: 06:00:00 via INTRAVENOUS

## 2015-01-01 NOTE — H&P (View-Only) (Signed)
HPI: FU MV repair, AFib/flutter, s/p MAZE procedure, nonobstructive CAD, prior embolic CVA after initial MV repair secondary to endocarditis requiring redo surgery (repair), diastolic CHF, COPD, HL, HTN.patient seen recently for increased dyspnea. Chest x-ray showed mild CHF and bibasilar atelectasis. BNP 48. Echocardiogram March 2016 showed normal LV function, mild aortic insufficiency, moderate left atrial enlargement, mild right atrial and right ventricular enlargement and moderately elevated pulmonary pressures. Holter monitor revealed atrial flutter with mildly elevated rate. Since last seen, he complains of significant dyspnea on exertion. Also with orthopnea but no PND. No pedal edema, chest pain or syncope.  Studies: - LHC 12/06: Mid RCA 40-50%. Carlton Adam Myoview (01/2014): Lateral soft tissue attenuation, no ischemia, not gated, low risk - Carotid Dopplers August 2015 showed 1-39% bilateral stenosis and follow-up recommended in 2 years.  Current Outpatient Prescriptions  Medication Sig Dispense Refill  . budesonide (PULMICORT) 0.5 MG/2ML nebulizer solution Take 0.5 mg by nebulization 2 (two) times daily.    Marland Kitchen diltiazem (CARDIZEM CD) 240 MG 24 hr capsule Take 1 capsule (240 mg total) by mouth daily. 90 capsule 3  . diltiazem (CARDIZEM) 120 MG tablet Take 1 tablet by mouth daily.    . furosemide (LASIX) 40 MG tablet Take 1 tablet (40 mg total) by mouth daily as needed. (Patient taking differently: Take 40 mg by mouth daily. ) 90 tablet 4  . levalbuterol (XOPENEX HFA) 45 MCG/ACT inhaler Inhale 2 puffs into the lungs every 4 (four) hours as needed for wheezing. 1 Inhaler 12  . levalbuterol (XOPENEX) 0.63 MG/3ML nebulizer solution Take 3 mLs (0.63 mg total) by nebulization 4 (four) times daily. DX J43.9 360 mL 6  . LORazepam (ATIVAN) 0.5 MG tablet Take 1 tablet (0.5 mg total) by mouth 2 (two) times daily as needed for anxiety. 30 tablet 1  . pravastatin (PRAVACHOL) 40 MG tablet TAKE  1 TABLET EVERY EVENING 90 tablet 3  . traMADol (ULTRAM) 50 MG tablet Take 1 tablet (50 mg total) by mouth every 8 (eight) hours as needed. 30 tablet 0  . warfarin (COUMADIN) 5 MG tablet Take 5-7.5 mg by mouth daily. 5mg  daily and 7.5mg  on Tuesdays    . digoxin (LANOXIN) 0.125 MG tablet Take 1 tablet (0.125 mg total) by mouth daily. 90 tablet 3   No current facility-administered medications for this visit.     Past Medical History  Diagnosis Date  . Mediastinal lymphadenopathy   . History of prostate cancer   . Atrial fibrillation     Coumadin  . COPD (chronic obstructive pulmonary disease)   . Adrenal adenoma   . Warthin's tumor   . Dyslipidemia   . Diverticulosis   . Embolic stroke     d/t endocarditis 2008  . Atrial flutter   . Ulcerative colitis     left-sided/segmental, associated with diverticulosis.  Sulfasalazine per GI (Dr. Carlean Purl).  Marland Kitchen HTN (hypertension)   . Thrombocytopenia   . Right thyroid nodule   . Hemorrhoids   . TIA (transient ischemic attack) 03/25/2011    ? of due to transient diplopia  . CAD (coronary artery disease)     Lexiscan Myoview (01/2014): Lateral soft tissue attenuation, no ischemia, not gated, low risk  . CAP (community acquired pneumonia)     Hospitalized 10/2014  . History of mitral valve replacement     Past Surgical History  Procedure Laterality Date  . Mitral valve repair  09/22/04    and modified Cox-Maze IV   .  Mitral valve repair  09/14/05    redo MV repair d/t endocardiits/embolic events  . Prostatectomy    . Inguinal hernia repair      right  . Cataract extraction, bilateral    . Salivary gland surgery      left resection  . Flexible sigmoidoscopy  01/11/2008; 03/19/2010    2009 and 2011:segmental left colitis, diverticulosis, hemorrhoids  . Colonoscopy w/ biopsies  12/19/2007    left-sided colitis, diverticulosis, hemorrhoids  . Carotid duplex dopplers  04/2014    No signif plaques; repeat 2 yrs per cardiology  . Transthoracic  echocardiogram  11/2013    Mod LVH, EF 55-60%, no WM abnormalities, +LA dilation, +severe RV dilation and impaired systolic fxn, +increased pulm art pressures.  Valves ok.    History   Social History  . Marital Status: Married    Spouse Name: Jeanett Schlein  . Number of Children: 2  . Years of Education: N/A   Occupational History  . RETIRED     [employer]   .     Social History Main Topics  . Smoking status: Former Smoker -- 3.00 packs/day for 60 years    Types: Cigarettes    Quit date: 09/19/2002  . Smokeless tobacco: Never Used  . Alcohol Use: No  . Drug Use: No  . Sexual Activity: Not on file   Other Topics Concern  . Not on file   Social History Narrative   Pt has children    ROS: no fevers or chills, productive cough, hemoptysis, dysphasia, odynophagia, melena, hematochezia, dysuria, hematuria, rash, seizure activity, claudication. Remaining systems are negative.  Physical Exam: Well-developed well-nourished in no acute distress.  Skin is warm and dry.  HEENT is normal.  Neck is supple.  Chest minimal basilar crackles Cardiovascular exam is irregular Abdominal exam nontender or distended. No masses palpated. Extremities show no edema. neuro grossly intact

## 2015-01-01 NOTE — Discharge Instructions (Addendum)
Resume coumadin and lovenox this evening  CALL FOR ANY SIGNS OR SYMPTOMS OF INFECTION: FEVER, REDNESS, SWELLING, PUS OOZING FROM THE SITE.

## 2015-01-01 NOTE — CV Procedure (Signed)
    Cardiac Catheterization Procedure Note  Name: [name] MRN: [id number] DOB: [birth year]  Procedure: Right Heart Cath,   Indication: 80 yo WM s/p MVR, chronic Afib and COPD presents with refractory dyspnea.    Procedural Details: The right arm was prepped, draped, and anesthetized with 1% lidocaine. A 5 French sheath was placed in the right brachial vein. A Swan-Ganz catheter was used for the right heart catheterization. Standard protocol was followed for recording of right heart pressures and sampling of oxygen saturations. Fick cardiac output was calculated.  There were no immediate procedural complications. The patient was transferred to the post catheterization recovery area for further monitoring.  Procedural Findings: Hemodynamics RA 7/10 mean 8 mm Hg RV 54/7 mm Hg PA 59/23 mean 35 mm Hg PCWP 17/25 mean 18 mm Hg   Oxygen saturations: PA 66% AO 94%  Cardiac Output (Fick) 5.37 L/min  Cardiac Index (Fick) 2.7 L/min/meter squared.      Final Conclusions:   1. Moderate pulmonary HTN 2. Upper normal LV filling pressures. 3. Normal cardiac output.  Recommendations: Medical management.   [name], [place] 01/01/2015, 8:25 AM

## 2015-01-01 NOTE — Progress Notes (Signed)
Site area: right brachial  Site Prior to Removal:  Level 0  Pressure Applied For 10 MINUTES    Minutes Beginning at [phone]  Manual:   Yes.    Patient Status During Pull:  stable  Post Pull Brachial Site:  Level 0  Post Pull Instructions Given:  Yes.    Post Pull Pulses Present:  Yes.    Dressing Applied:  Yes.     Bedrest Begins: 0845  Comments:  Pt tolerated right brachial venous sheath pull well.

## 2015-01-01 NOTE — Interval H&P Note (Signed)
History and Physical Interval Note:  01/01/2015 8:06 AM  [name]  has presented today for surgery, with the diagnosis of cp/hp  The various methods of treatment have been discussed with the patient and family. After consideration of risks, benefits and other options for treatment, the patient has consented to  Procedure(s): RIGHT HEART CATH (N/A) as a surgical intervention .  The patient's history has been reviewed, patient examined, no change in status, stable for surgery.  I have reviewed the patient's chart and labs.  Questions were answered to the patient's satisfaction.     Collier Salina [facility] 01/01/2015 8:07 AM

## 2015-01-02 ENCOUNTER — Telehealth: Payer: Self-pay | Admitting: Cardiology

## 2015-01-02 ENCOUNTER — Other Ambulatory Visit: Payer: Self-pay | Admitting: Cardiology

## 2015-01-02 DIAGNOSIS — J189 Pneumonia, unspecified organism: Secondary | ICD-10-CM | POA: Diagnosis not present

## 2015-01-02 DIAGNOSIS — J441 Chronic obstructive pulmonary disease with (acute) exacerbation: Secondary | ICD-10-CM | POA: Diagnosis not present

## 2015-01-02 DIAGNOSIS — I2510 Atherosclerotic heart disease of native coronary artery without angina pectoris: Secondary | ICD-10-CM | POA: Diagnosis not present

## 2015-01-02 DIAGNOSIS — I4891 Unspecified atrial fibrillation: Secondary | ICD-10-CM | POA: Diagnosis not present

## 2015-01-02 DIAGNOSIS — J440 Chronic obstructive pulmonary disease with acute lower respiratory infection: Secondary | ICD-10-CM | POA: Diagnosis not present

## 2015-01-02 DIAGNOSIS — I10 Essential (primary) hypertension: Secondary | ICD-10-CM | POA: Diagnosis not present

## 2015-01-02 MED ORDER — DILTIAZEM HCL ER COATED BEADS 240 MG PO CP24: 240.0000 mg | ORAL_CAPSULE | Freq: Every day | ORAL | Status: DC

## 2015-01-02 MED ORDER — DILTIAZEM HCL ER COATED BEADS 120 MG PO CP24: 120.0000 mg | ORAL_CAPSULE | Freq: Every day | ORAL | Status: DC

## 2015-01-02 NOTE — Telephone Encounter (Signed)
Re-ordered in separate encounter

## 2015-01-02 NOTE — Telephone Encounter (Signed)
Mrs. [name] is calling because [employer] states that they need a new prescription for the 120mg  Diltiazem to be filled as a capsule and not a tablet . Please call . If unable to reach on home phone , please call on cell phone [phone]..   Thanks

## 2015-01-02 NOTE — Telephone Encounter (Signed)
Pt needed capsule instead of tablet (extended release form). Reordered for both strengths per pt request.

## 2015-01-07 ENCOUNTER — Ambulatory Visit (INDEPENDENT_AMBULATORY_CARE_PROVIDER_SITE_OTHER): Payer: Medicare Other | Admitting: *Deleted

## 2015-01-07 DIAGNOSIS — Z7901 Long term (current) use of anticoagulants: Secondary | ICD-10-CM | POA: Diagnosis not present

## 2015-01-07 DIAGNOSIS — Z5181 Encounter for therapeutic drug level monitoring: Secondary | ICD-10-CM | POA: Diagnosis not present

## 2015-01-07 DIAGNOSIS — Z8679 Personal history of other diseases of the circulatory system: Secondary | ICD-10-CM | POA: Diagnosis not present

## 2015-01-07 DIAGNOSIS — I481 Persistent atrial fibrillation: Secondary | ICD-10-CM | POA: Diagnosis not present

## 2015-01-07 DIAGNOSIS — I4892 Unspecified atrial flutter: Secondary | ICD-10-CM | POA: Diagnosis not present

## 2015-01-07 DIAGNOSIS — I4819 Other persistent atrial fibrillation: Secondary | ICD-10-CM

## 2015-01-07 LAB — POCT INR: INR: 2.1

## 2015-01-08 ENCOUNTER — Other Ambulatory Visit: Payer: Self-pay | Admitting: Cardiology

## 2015-01-09 ENCOUNTER — Encounter: Payer: Self-pay | Admitting: Nurse Practitioner

## 2015-01-09 DIAGNOSIS — I272 Pulmonary hypertension, unspecified: Secondary | ICD-10-CM | POA: Insufficient documentation

## 2015-01-09 HISTORY — DX: Pulmonary hypertension, unspecified: I27.20

## 2015-01-21 ENCOUNTER — Ambulatory Visit (INDEPENDENT_AMBULATORY_CARE_PROVIDER_SITE_OTHER): Payer: Medicare Other | Admitting: *Deleted

## 2015-01-21 DIAGNOSIS — Z7901 Long term (current) use of anticoagulants: Secondary | ICD-10-CM

## 2015-01-21 DIAGNOSIS — I481 Persistent atrial fibrillation: Secondary | ICD-10-CM

## 2015-01-21 DIAGNOSIS — I4892 Unspecified atrial flutter: Secondary | ICD-10-CM | POA: Diagnosis not present

## 2015-01-21 DIAGNOSIS — Z5181 Encounter for therapeutic drug level monitoring: Secondary | ICD-10-CM | POA: Diagnosis not present

## 2015-01-21 DIAGNOSIS — Z8679 Personal history of other diseases of the circulatory system: Secondary | ICD-10-CM

## 2015-01-21 DIAGNOSIS — I4819 Other persistent atrial fibrillation: Secondary | ICD-10-CM

## 2015-01-21 LAB — POCT INR: INR: 1.9

## 2015-01-30 ENCOUNTER — Telehealth: Payer: Self-pay | Admitting: Cardiology

## 2015-01-30 NOTE — Telephone Encounter (Signed)
Spoke with [name], aware dr Stanford Breed on vacation and will sign orders when he returns.

## 2015-02-04 ENCOUNTER — Ambulatory Visit (INDEPENDENT_AMBULATORY_CARE_PROVIDER_SITE_OTHER): Payer: Medicare Other | Admitting: *Deleted

## 2015-02-04 DIAGNOSIS — I481 Persistent atrial fibrillation: Secondary | ICD-10-CM

## 2015-02-04 DIAGNOSIS — Z8679 Personal history of other diseases of the circulatory system: Secondary | ICD-10-CM | POA: Diagnosis not present

## 2015-02-04 DIAGNOSIS — Z5181 Encounter for therapeutic drug level monitoring: Secondary | ICD-10-CM | POA: Diagnosis not present

## 2015-02-04 DIAGNOSIS — I4892 Unspecified atrial flutter: Secondary | ICD-10-CM

## 2015-02-04 DIAGNOSIS — Z7901 Long term (current) use of anticoagulants: Secondary | ICD-10-CM | POA: Diagnosis not present

## 2015-02-04 DIAGNOSIS — I4819 Other persistent atrial fibrillation: Secondary | ICD-10-CM

## 2015-02-04 LAB — POCT INR: INR: 2.1

## 2015-02-09 ENCOUNTER — Encounter: Payer: Self-pay | Admitting: Pulmonary Disease

## 2015-02-09 ENCOUNTER — Ambulatory Visit (INDEPENDENT_AMBULATORY_CARE_PROVIDER_SITE_OTHER): Payer: Medicare Other | Admitting: Pulmonary Disease

## 2015-02-09 VITALS — BP 132/74 | HR 110 | Temp 97.0°F | Ht 68.0 in | Wt 196.0 lb

## 2015-02-09 DIAGNOSIS — J438 Other emphysema: Secondary | ICD-10-CM | POA: Diagnosis not present

## 2015-02-09 DIAGNOSIS — I2510 Atherosclerotic heart disease of native coronary artery without angina pectoris: Secondary | ICD-10-CM

## 2015-02-09 NOTE — Patient Instructions (Signed)
Will send an order to [facility] to test you for a more portable oxygen source, and will also check your neb machine to make sure working properly. Try and wear your oxygen more consistently, and if not, check your oxygen level with your oximeter to make sure staying at least 90% Please let your cardiologist know that your heart rate is poorly controlled.  I will send them my note. Make sure you take your xopenex 4 times a day no matter what, and can take 2 more treatments if needed during the day Take your budesonide TWICE a day no matter what.  I would highly recommend that you attend a pulmonary rehab program.  Let us know if interested, and I can refer.  followup in 50mos with Dr. Lamonte Sakai.

## 2015-02-09 NOTE — Assessment & Plan Note (Signed)
The patient continues to have significant dyspnea on exertion, and it is obviously multifactorial. He does have COPD, but is also having issues with atrial fibrillation with a rapid ventricular response. His rate is elevated today on exam. Part of the issue is that he is not using his medications and oxygen as compliantly as he needs to. I have talked with him about this, and he will try to do better. He is complaining of lower extremity weakness and balance issues, and I stressed to him the importance of a pulmonary rehabilitation program. Will also try and get him a more portable oxygen source such as a portable concentrator so that he will be more compliant with his oxygen therapy. I have asked the patient to call his cardiologist and let them know that his heart rate is not controlled.

## 2015-02-09 NOTE — Progress Notes (Signed)
   Subjective:    Patient ID: [name], male    DOB: 10-May-1936, 79 y.o.   MRN: [id number]  HPI The patient comes in today for follow-up of his known COPD. He also has underlying heart disease with atrial fibrillation and currently with poor rate control. The patient feels that his breathing is not doing well currently, but has no significant congestion or purulent mucus. He has had a recent right heart catheterization showed mild pulmonary hypertension, and an elevated wedge pressure with a mean of 18. The patient feels that he has had more bad days than good days recently, but admits that he has not been wearing his oxygen as compliantly as he should. He also has not been using his budesonide twice a day. He does very little activity, and admits his legs are getting weaker and his balance is worse.   Review of Systems  Constitutional: Negative for fever and unexpected weight change.  HENT: Negative for congestion, dental problem, ear pain, nosebleeds, postnasal drip, rhinorrhea, sinus pressure, sneezing, sore throat and trouble swallowing.   Eyes: Negative for redness and itching.  Respiratory: Positive for cough and shortness of breath. Negative for chest tightness and wheezing.   Cardiovascular: Negative for palpitations and leg swelling.  Gastrointestinal: Negative for nausea and vomiting.  Genitourinary: Negative for dysuria.  Musculoskeletal: Negative for joint swelling.  Skin: Negative for rash.  Neurological: Negative for headaches.  Hematological: Does not bruise/bleed easily.  Psychiatric/Behavioral: Negative for dysphoric mood. The patient is not nervous/anxious.        Objective:   Physical Exam Overweight male in no acute distress Nose without purulence or discharge noted Neck without lymphadenopathy or thyromegaly Chest with decreased breath sounds, a few basilar crackles, no wheezes Cardiac exam with irregular rhythm and accelerated ventricular response. Rate is  approximately 120 apically. Lower extremities without significant edema, no cyanosis Alert and oriented, moves all 4 extremities.    Assessment & Plan:

## 2015-02-10 ENCOUNTER — Telehealth: Payer: Self-pay | Admitting: Pulmonary Disease

## 2015-02-10 DIAGNOSIS — I4819 Other persistent atrial fibrillation: Secondary | ICD-10-CM

## 2015-02-10 DIAGNOSIS — J438 Other emphysema: Secondary | ICD-10-CM

## 2015-02-10 NOTE — Telephone Encounter (Signed)
Spoke with [name] from [place]. She reports pt has capped out w/ insurance and is not able to get POC.  [name] reports spouse/pt is aware of this.  Pt neb machine is from [facility] and is from 2009. Needs order sent for new neb machine. Order placed.  Also will forward to [facility] to make him aware.

## 2015-02-10 NOTE — Progress Notes (Signed)
[name] Please call [name] and ask if he is still taking Digoxin. When he saw Dr. Kirk Ruths last, he placed him on Digoxin. There is a note to hold it for a HR < 60.  So, I'm not sure if he stopped it. Thanks, Richardson Dopp, PA-C   02/10/2015 9:41 PM

## 2015-02-17 NOTE — Telephone Encounter (Signed)
Pt notified per Richardson Dopp, PA's conversation w/Dr. Gwenette Greet that he will need to wear a 48 hour holter monitor due to HR uncontrolled per Dr. Gwenette Greet last ov note. I asked pt if he was still taking Digoxin, he states yes. I advised pt I will let Nicki Reaper W. PA know he is still taking digoxin. Pt aware our office will call to schedule 48 hour holter monitor. Pt said ok and thank you.

## 2015-02-17 NOTE — Telephone Encounter (Signed)
Pt notified per Richardson Dopp, PA's conversation w/Dr. Gwenette Greet that he will need to wear a 48 hour holter monitor due to HR uncontrolled per Dr. Gwenette Greet last ov note. I asked pt if he was still taking Digoxin, he states yes.

## 2015-02-17 NOTE — Telephone Encounter (Signed)
Mindy please advise if this message can be closed.  Thanks!

## 2015-02-17 NOTE — Addendum Note (Signed)
Addended by: Michae Kava on: 02/17/2015 02:16 PM   Modules accepted: Orders

## 2015-02-19 ENCOUNTER — Telehealth: Payer: Self-pay | Admitting: Pulmonary Disease

## 2015-02-19 MED ORDER — LEVALBUTEROL TARTRATE 45 MCG/ACT IN AERO: 2.0000 | INHALATION_SPRAY | RESPIRATORY_TRACT | Status: DC | PRN

## 2015-02-19 NOTE — Telephone Encounter (Signed)
Spoke with pt's wife and advised that the rescue inhaler we have listed is for Xopenex.  She stated that is what pt would like refilled.  Refill sent.

## 2015-02-25 ENCOUNTER — Ambulatory Visit (INDEPENDENT_AMBULATORY_CARE_PROVIDER_SITE_OTHER): Payer: Medicare Other

## 2015-02-25 ENCOUNTER — Ambulatory Visit (INDEPENDENT_AMBULATORY_CARE_PROVIDER_SITE_OTHER): Payer: Medicare Other | Admitting: *Deleted

## 2015-02-25 DIAGNOSIS — Z5181 Encounter for therapeutic drug level monitoring: Secondary | ICD-10-CM

## 2015-02-25 DIAGNOSIS — Z7901 Long term (current) use of anticoagulants: Secondary | ICD-10-CM | POA: Diagnosis not present

## 2015-02-25 DIAGNOSIS — Z8679 Personal history of other diseases of the circulatory system: Secondary | ICD-10-CM

## 2015-02-25 DIAGNOSIS — I481 Persistent atrial fibrillation: Secondary | ICD-10-CM

## 2015-02-25 DIAGNOSIS — I4819 Other persistent atrial fibrillation: Secondary | ICD-10-CM

## 2015-02-25 DIAGNOSIS — I4892 Unspecified atrial flutter: Secondary | ICD-10-CM | POA: Diagnosis not present

## 2015-02-25 LAB — POCT INR: INR: 2.1

## 2015-03-25 ENCOUNTER — Ambulatory Visit (INDEPENDENT_AMBULATORY_CARE_PROVIDER_SITE_OTHER): Payer: Medicare Other | Admitting: *Deleted

## 2015-03-25 DIAGNOSIS — I4892 Unspecified atrial flutter: Secondary | ICD-10-CM

## 2015-03-25 DIAGNOSIS — Z7901 Long term (current) use of anticoagulants: Secondary | ICD-10-CM | POA: Diagnosis not present

## 2015-03-25 DIAGNOSIS — Z8679 Personal history of other diseases of the circulatory system: Secondary | ICD-10-CM | POA: Diagnosis not present

## 2015-03-25 DIAGNOSIS — Z5181 Encounter for therapeutic drug level monitoring: Secondary | ICD-10-CM | POA: Diagnosis not present

## 2015-03-25 DIAGNOSIS — I481 Persistent atrial fibrillation: Secondary | ICD-10-CM

## 2015-03-25 DIAGNOSIS — I4819 Other persistent atrial fibrillation: Secondary | ICD-10-CM

## 2015-03-25 LAB — POCT INR: INR: 2.4

## 2015-03-26 ENCOUNTER — Telehealth: Payer: Self-pay | Admitting: Emergency Medicine

## 2015-03-26 DIAGNOSIS — J449 Chronic obstructive pulmonary disease, unspecified: Secondary | ICD-10-CM

## 2015-03-26 NOTE — Telephone Encounter (Signed)
Called and spoke to [place]. [name] that the pt has a history of a-fib which is why they are on xopenex. [name] stated she will check to see if the pt is still needs the Budesonide as the rx still has additional refills. [name] stated she will call us back. Will await [name]'s call.

## 2015-03-27 MED ORDER — BUDESONIDE 0.5 MG/2ML IN SUSP: 0.5000 mg | Freq: Two times a day (BID) | RESPIRATORY_TRACT | Status: DC

## 2015-03-27 NOTE — Telephone Encounter (Signed)
rx printed and taken to Dr. Melvyn Novas to sign, put in [facility] box to send with DME order. Nothing further needed.

## 2015-03-27 NOTE — Telephone Encounter (Signed)
[name] from [facility] says that they do not do Xopenex.  Patient would just like to get Budesonide through [facility].  The patient will continue to get his Xopenex from the drug store, but he is currently in the donut hole and wants to get the budesonide from [facility] to help with cost.  Dr. Melvyn Novas, are you okay with signing prescription for this in RB's absence?

## 2015-03-27 NOTE — Telephone Encounter (Signed)
[facility]-kim calling back pt wants to get his budesomide thru them so they need a rx     [phone] phone

## 2015-03-27 NOTE — Telephone Encounter (Signed)
That's fine

## 2015-03-27 NOTE — Telephone Encounter (Signed)
Attempted to call [facility]. No answer. Will try back.

## 2015-05-06 NOTE — Progress Notes (Signed)
HPI: FU MV repair, AFib/flutter, s/p MAZE procedure, nonobstructive CAD, prior embolic CVA after initial MV repair secondary to endocarditis requiring redo surgery (repair), diastolic CHF, COPD, HL, HTN.Echocardiogram March 2016 showed normal LV function, mild aortic insufficiency, moderate left atrial enlargement, mild right atrial and right ventricular enlargement and moderately elevated pulmonary pressures. Holter monitor June 2016 showed atrial fibrillation rate controlled. Right heart catheterization April 2016 showed a right atrial pressure of 8, PA pressure of 59/23 and pulmonary Wedge pressure of 18. Findings consistent with moderate pulmonary hypertension and upper normal left ventricular filling pressures. Since last seen, he notes dyspnea on exertion which is unchanged. No orthopnea, PND, pedal edema, chest pain, syncope or bleeding. I will see no  Studies: - LHC 12/06: Mid RCA 40-50%. Carlton Adam Myoview (01/2014): Lateral soft tissue attenuation, no ischemia, not gated, low risk - Carotid Dopplers August 2015 showed 1-39% bilateral stenosis and follow-up recommended in 2 years.  Current Outpatient Prescriptions  Medication Sig Dispense Refill  . albuterol (PROVENTIL HFA;VENTOLIN HFA) 108 (90 BASE) MCG/ACT inhaler Inhale 1 puff into the lungs every 6 (six) hours as needed for wheezing or shortness of breath (using 5-6 times every 24 hrs).    . budesonide (PULMICORT) 0.5 MG/2ML nebulizer solution Take 2 mLs (0.5 mg total) by nebulization 2 (two) times daily. 60 mL 0  . digoxin (LANOXIN) 0.125 MG tablet Take 1 tablet (0.125 mg total) by mouth daily. 90 tablet 3  . diltiazem (CARDIZEM CD) 120 MG 24 hr capsule Take 1 capsule (120 mg total) by mouth daily. 90 capsule 3  . diltiazem (CARDIZEM CD) 240 MG 24 hr capsule Take 1 capsule (240 mg total) by mouth daily. 90 capsule 3  . furosemide (LASIX) 40 MG tablet Take 1 tablet (40 mg total) by mouth daily as needed. (Patient taking  differently: Take 40 mg by mouth daily. ) 90 tablet 4  . levalbuterol (XOPENEX HFA) 45 MCG/ACT inhaler Inhale 2 puffs into the lungs every 4 (four) hours as needed for wheezing. 3 Inhaler 1  . levalbuterol (XOPENEX) 0.63 MG/3ML nebulizer solution Take 3 mLs (0.63 mg total) by nebulization 4 (four) times daily. DX J43.9 360 mL 6  . LORazepam (ATIVAN) 0.5 MG tablet Take 1 tablet (0.5 mg total) by mouth 2 (two) times daily as needed for anxiety. 30 tablet 1  . pravastatin (PRAVACHOL) 40 MG tablet TAKE 1 TABLET EVERY EVENING 90 tablet 3  . warfarin (COUMADIN) 5 MG tablet TAKE 1 TABLET AS DIRECTED 120 tablet 1   No current facility-administered medications for this visit.     Past Medical History  Diagnosis Date  . Mediastinal lymphadenopathy   . History of prostate cancer   . Atrial fibrillation     Coumadin  . COPD (chronic obstructive pulmonary disease)   . Adrenal adenoma   . Warthin's tumor   . Dyslipidemia   . Diverticulosis   . Embolic stroke     d/t endocarditis 2008  . Atrial flutter   . Ulcerative colitis     left-sided/segmental, associated with diverticulosis.  Sulfasalazine per GI (Dr. Carlean Purl).  Marland Kitchen HTN (hypertension)   . Thrombocytopenia   . Right thyroid nodule   . Hemorrhoids   . TIA (transient ischemic attack) 03/25/2011    ? of due to transient diplopia  . CAD (coronary artery disease)     Lexiscan Myoview (01/2014): Lateral soft tissue attenuation, no ischemia, not gated, low risk  . CAP (community acquired pneumonia)  Hospitalized 10/2014  . History of mitral valve replacement     Past Surgical History  Procedure Laterality Date  . Mitral valve repair  09/22/04    and modified Cox-Maze IV   . Mitral valve repair  09/14/05    redo MV repair d/t endocardiits/embolic events  . Prostatectomy    . Inguinal hernia repair      right  . Cataract extraction, bilateral    . Salivary gland surgery      left resection  . Flexible sigmoidoscopy  01/11/2008; 03/19/2010      2009 and 2011:segmental left colitis, diverticulosis, hemorrhoids  . Colonoscopy w/ biopsies  12/19/2007    left-sided colitis, diverticulosis, hemorrhoids  . Carotid duplex dopplers  04/2014    No signif plaques; repeat 2 yrs per cardiology  . Transthoracic echocardiogram  11/2013    Mod LVH, EF 55-60%, no WM abnormalities, +LA dilation, +severe RV dilation and impaired systolic fxn, +increased pulm art pressures.  Valves ok.  . Right heart catheterization N/A 01/01/2015    Procedure: RIGHT HEART CATH;  Surgeon: Peter M Martinique, MD;  Location: [facility] CATH LAB;  Service: Cardiovascular;  Laterality: N/A;    Social History   Social History  . Marital Status: Married    Spouse Name: Jeanett Schlein  . Number of Children: 2  . Years of Education: N/A   Occupational History  . RETIRED     [employer]   .     Social History Main Topics  . Smoking status: Former Smoker -- 3.00 packs/day for 60 years    Types: Cigarettes    Quit date: 09/19/2002  . Smokeless tobacco: Never Used  . Alcohol Use: No  . Drug Use: No  . Sexual Activity: Not on file   Other Topics Concern  . Not on file   Social History Narrative   Pt has children    ROS: no fevers or chills, productive cough, hemoptysis, dysphasia, odynophagia, melena, hematochezia, dysuria, hematuria, rash, seizure activity, orthopnea, PND, pedal edema, claudication. Remaining systems are negative.  Physical Exam: Well-developed well-nourished in no acute distress.  Skin is warm and dry.  HEENT is normal.  Neck is supple.  Chest is clear to auscultation with normal expansion.  Cardiovascular exam is regular rate and rhythm.  Abdominal exam nontender or distended. No masses palpated. Extremities show no edema. neuro grossly intact  ECG atrial fibrillation at a rate of 61. Incomplete right bundle branch block. Nonspecific ST changes.

## 2015-05-08 ENCOUNTER — Ambulatory Visit (INDEPENDENT_AMBULATORY_CARE_PROVIDER_SITE_OTHER): Payer: Medicare Other | Admitting: Pharmacist Clinician (PhC)/ Clinical Pharmacy Specialist

## 2015-05-08 ENCOUNTER — Ambulatory Visit (INDEPENDENT_AMBULATORY_CARE_PROVIDER_SITE_OTHER): Payer: Medicare Other | Admitting: Cardiology

## 2015-05-08 ENCOUNTER — Encounter: Payer: Self-pay | Admitting: Cardiology

## 2015-05-08 DIAGNOSIS — I4819 Other persistent atrial fibrillation: Secondary | ICD-10-CM

## 2015-05-08 DIAGNOSIS — I2510 Atherosclerotic heart disease of native coronary artery without angina pectoris: Secondary | ICD-10-CM

## 2015-05-08 DIAGNOSIS — I4892 Unspecified atrial flutter: Secondary | ICD-10-CM | POA: Diagnosis not present

## 2015-05-08 DIAGNOSIS — Z8679 Personal history of other diseases of the circulatory system: Secondary | ICD-10-CM | POA: Diagnosis not present

## 2015-05-08 DIAGNOSIS — I481 Persistent atrial fibrillation: Secondary | ICD-10-CM | POA: Diagnosis not present

## 2015-05-08 DIAGNOSIS — Z5181 Encounter for therapeutic drug level monitoring: Secondary | ICD-10-CM | POA: Diagnosis not present

## 2015-05-08 DIAGNOSIS — Z7901 Long term (current) use of anticoagulants: Secondary | ICD-10-CM | POA: Diagnosis not present

## 2015-05-08 LAB — POCT INR: INR: 2.6

## 2015-05-08 MED ORDER — FUROSEMIDE 40 MG PO TABS: ORAL_TABLET | ORAL | Status: DC

## 2015-05-08 MED ORDER — PRAVASTATIN SODIUM 40 MG PO TABS: 40.0000 mg | ORAL_TABLET | Freq: Every evening | ORAL | Status: DC

## 2015-05-08 MED ORDER — DILTIAZEM HCL ER COATED BEADS 120 MG PO CP24: 120.0000 mg | ORAL_CAPSULE | Freq: Every day | ORAL | Status: DC

## 2015-05-08 MED ORDER — DIGOXIN 125 MCG PO TABS: 0.1250 mg | ORAL_TABLET | Freq: Every day | ORAL | Status: DC

## 2015-05-08 MED ORDER — DILTIAZEM HCL ER COATED BEADS 240 MG PO CP24
240.0000 mg | ORAL_CAPSULE | Freq: Every day | ORAL | Status: DC
Start: 2015-05-08 — End: 2015-10-22

## 2015-05-08 MED ORDER — WARFARIN SODIUM 5 MG PO TABS: ORAL_TABLET | ORAL | Status: DC

## 2015-05-08 NOTE — Assessment & Plan Note (Signed)
Patient remains in atrial fibrillation. Continue Cardizem and digoxin for rate control. Continue Coumadin.

## 2015-05-08 NOTE — Assessment & Plan Note (Signed)
Blood pressure controlled. Continue present medications. 

## 2015-05-08 NOTE — Assessment & Plan Note (Signed)
Patient continues to be short of breath. This is multifactorial. His heart rate is now controlled on Cardizem and digoxin. His right heart catheterization recently demonstrated mildly elevated pulmonary capillary wedge pressure. Change Lasix to 40 mg in the morning and the following day 20 mg. Potassium and renal function in 1 week. Probable component of deconditioning, COPD and obesity hypoventilation syndrome.

## 2015-05-08 NOTE — Assessment & Plan Note (Signed)
Follow-up carotid Dopplers August 2017.

## 2015-05-08 NOTE — Patient Instructions (Addendum)
.  Your physician wants you to follow-up in: [place] will receive a reminder letter in the mail two months in advance. If you don't receive a letter, please call our office to schedule the follow-up appointment.   CHANGE FUROSEMIDE TO 40 MG ALTERNATING WITH 1/2 TABLET EVERY OTHER DAY  Your physician recommends that you return for lab work in: [place] [phone]

## 2015-05-08 NOTE — Assessment & Plan Note (Signed)
Continue statin. 

## 2015-05-08 NOTE — Assessment & Plan Note (Signed)
History of mitral valve repair. Continue SBE prophylaxis.

## 2015-05-15 ENCOUNTER — Other Ambulatory Visit (INDEPENDENT_AMBULATORY_CARE_PROVIDER_SITE_OTHER): Payer: Medicare Other

## 2015-05-15 DIAGNOSIS — I481 Persistent atrial fibrillation: Secondary | ICD-10-CM

## 2015-05-15 DIAGNOSIS — I4819 Other persistent atrial fibrillation: Secondary | ICD-10-CM

## 2015-05-15 LAB — BASIC METABOLIC PANEL
BUN: 16 mg/dL (ref 6–23)
CALCIUM: 9.5 mg/dL (ref 8.4–10.5)
CHLORIDE: 100 meq/L (ref 96–112)
CO2: 28 meq/L (ref 19–32)
Creatinine, Ser: 1.38 mg/dL (ref 0.40–1.50)
GFR: 53.05 mL/min — ABNORMAL LOW (ref >60.00)
GLUCOSE: 187 mg/dL — AB (ref 70–99)
Potassium: 3.7 mEq/L (ref 3.5–5.1)
Sodium: 136 mEq/L (ref 135–145)

## 2015-05-18 ENCOUNTER — Telehealth: Payer: Self-pay | Admitting: Cardiology

## 2015-05-18 DIAGNOSIS — R0602 Shortness of breath: Secondary | ICD-10-CM

## 2015-05-18 NOTE — Telephone Encounter (Signed)
Calling about his test results .Marland Kitchen Please call   1. Which medications need to be refilled? Dioxin  2. Which pharmacy is medication to be sent to?[facility]  3. Do they need a 30 day or 90 day supply? 90  4. Would they like a call back once the medication has been sent to the pharmacy? Yes

## 2015-05-18 NOTE — Telephone Encounter (Signed)
Spoke with patient's wife and informed her med was refilled to [facility] 05/08/15 during [place].   Informed wife of results. She states patient went to lab in [facility] office.   Message routed to [facility] to order lab and arrange appointment for patient.

## 2015-05-19 NOTE — Telephone Encounter (Signed)
Lab orders placed for [address]

## 2015-05-28 ENCOUNTER — Other Ambulatory Visit (INDEPENDENT_AMBULATORY_CARE_PROVIDER_SITE_OTHER): Payer: Medicare Other

## 2015-05-28 DIAGNOSIS — R0602 Shortness of breath: Secondary | ICD-10-CM | POA: Diagnosis not present

## 2015-05-28 LAB — BASIC METABOLIC PANEL
BUN: 14 mg/dL (ref 6–23)
CALCIUM: 9.2 mg/dL (ref 8.4–10.5)
CO2: 27 mEq/L (ref 19–32)
CREATININE: 1.33 mg/dL (ref 0.40–1.50)
Chloride: 104 mEq/L (ref 96–112)
GFR: 55.35 mL/min — AB (ref >60.00)
Glucose, Bld: 120 mg/dL — ABNORMAL HIGH (ref 70–99)
POTASSIUM: 3.8 meq/L (ref 3.5–5.1)
Sodium: 137 mEq/L (ref 135–145)

## 2015-05-29 ENCOUNTER — Other Ambulatory Visit: Payer: Medicare Other

## 2015-06-05 ENCOUNTER — Ambulatory Visit: Payer: Medicare Other | Admitting: Family Medicine

## 2015-06-05 ENCOUNTER — Encounter: Payer: Self-pay | Admitting: Family Medicine

## 2015-06-05 ENCOUNTER — Ambulatory Visit (INDEPENDENT_AMBULATORY_CARE_PROVIDER_SITE_OTHER): Payer: Medicare Other | Admitting: Family Medicine

## 2015-06-05 VITALS — BP 142/75 | HR 86 | Temp 97.4°F | Resp 16 | Ht 67.5 in | Wt 201.0 lb

## 2015-06-05 DIAGNOSIS — Z23 Encounter for immunization: Secondary | ICD-10-CM

## 2015-06-05 DIAGNOSIS — J438 Other emphysema: Secondary | ICD-10-CM | POA: Diagnosis not present

## 2015-06-05 DIAGNOSIS — I2510 Atherosclerotic heart disease of native coronary artery without angina pectoris: Secondary | ICD-10-CM | POA: Diagnosis not present

## 2015-06-05 DIAGNOSIS — I5033 Acute on chronic diastolic (congestive) heart failure: Secondary | ICD-10-CM

## 2015-06-05 DIAGNOSIS — I482 Chronic atrial fibrillation, unspecified: Secondary | ICD-10-CM

## 2015-06-05 DIAGNOSIS — Z0000 Encounter for general adult medical examination without abnormal findings: Secondary | ICD-10-CM | POA: Diagnosis not present

## 2015-06-05 MED ORDER — FUROSEMIDE 40 MG PO TABS: ORAL_TABLET | ORAL | Status: DC

## 2015-06-05 NOTE — Progress Notes (Signed)
OFFICE VISIT  06/05/2015   CC:  Chief Complaint  Patient presents with  . Follow-up   HPI:    Patient is a 79 y.o. Caucasian male who presents for 6 mo f/u chronic med issues. This is my first time seeing him--he had been seeing our NP, Nicky Pugh (Dr. Ival Bible to this). Feeling well currently.  Last night had dyspneic spell/PND, had not taken his fluid pill for 3d in a row b/c he thought it was "dehydrating him". He had gained about 5 lbs over the last week.  He tried taking neb and increased oxygen to 3 L and sat on side of bed and eventually felt better, then took fluid pill after this and has peed a bunch and lost 2+ lbs in the last day.  Currently breathing at baseline; gets DOE with walking 15 steps or so.    ROS: uses laxative about 1 time per week. No chest pain or palpitations.  No n/v/abd pain.   Past Medical History  Diagnosis Date  . Mediastinal lymphadenopathy   . History of prostate cancer remote past    f/u by Dr. Claris Che  . Atrial fibrillation     Rate control + Coumadin  . COPD (chronic obstructive pulmonary disease)     oxygen-dependent (2.5 L 24/7)  . Adrenal adenoma   . Warthin's tumor   . Dyslipidemia   . Diverticulosis   . Embolic stroke     d/t endocarditis 2008  . Atrial flutter   . Ulcerative colitis     left-sided/segmental, associated with diverticulosis.  Sulfasalazine per GI (Dr. Carlean Purl).  Marland Kitchen HTN (hypertension)   . Thrombocytopenia   . Right thyroid nodule   . Hemorrhoids   . TIA (transient ischemic attack) 03/25/2011    ? of due to transient diplopia  . CAD (coronary artery disease)     Lexiscan Myoview (01/2014): Lateral soft tissue attenuation, no ischemia, not gated, low risk  . CAP (community acquired pneumonia)     Hospitalized 10/2014  . History of mitral valve replacement   . Chronic renal insufficiency, stage III (moderate)     CrCl 50s  . Pulmonary hypertension 01/09/2015    secondary    Past Surgical  History  Procedure Laterality Date  . Mitral valve repair  09/22/04    and modified Cox-Maze IV   . Mitral valve repair  09/14/05    redo MV repair d/t endocardiits/embolic events  . Prostatectomy    . Inguinal hernia repair      right  . Cataract extraction, bilateral    . Salivary gland surgery      left resection  . Flexible sigmoidoscopy  01/11/2008; 03/19/2010    2009 and 2011:segmental left colitis, diverticulosis, hemorrhoids  . Colonoscopy w/ biopsies  12/19/2007    left-sided colitis, diverticulosis, hemorrhoids  . Carotid duplex dopplers  04/2014    No signif plaques; repeat 2 yrs per cardiology  . Transthoracic echocardiogram  11/2013    Mod LVH, EF 55-60%, no WM abnormalities, +LA dilation, +severe RV dilation and impaired systolic fxn, +increased pulm art pressures.  Valves ok.  . Right heart catheterization N/A 01/01/2015    Procedure: RIGHT HEART CATH;  Surgeon: Peter M Martinique, MD;  Location: [facility] CATH LAB;  Service: Cardiovascular;  Laterality: N/A;    Outpatient Prescriptions Prior to Visit  Medication Sig Dispense Refill  . albuterol (PROVENTIL HFA;VENTOLIN HFA) 108 (90 BASE) MCG/ACT inhaler Inhale 1 puff into the lungs every 6 (six) hours as needed  for wheezing or shortness of breath (using 5-6 times every 24 hrs).    . budesonide (PULMICORT) 0.5 MG/2ML nebulizer solution Take 2 mLs (0.5 mg total) by nebulization 2 (two) times daily. 60 mL 0  . digoxin (LANOXIN) 0.125 MG tablet Take 1 tablet (0.125 mg total) by mouth daily. 90 tablet 3  . diltiazem (CARDIZEM CD) 120 MG 24 hr capsule Take 1 capsule (120 mg total) by mouth daily. 90 capsule 3  . diltiazem (CARDIZEM CD) 240 MG 24 hr capsule Take 1 capsule (240 mg total) by mouth daily. 90 capsule 3  . levalbuterol (XOPENEX HFA) 45 MCG/ACT inhaler Inhale 2 puffs into the lungs every 4 (four) hours as needed for wheezing. 3 Inhaler 1  . levalbuterol (XOPENEX) 0.63 MG/3ML nebulizer solution Take 3 mLs (0.63 mg total) by nebulization  4 (four) times daily. DX J43.9 360 mL 6  . LORazepam (ATIVAN) 0.5 MG tablet Take 1 tablet (0.5 mg total) by mouth 2 (two) times daily as needed for anxiety. 30 tablet 1  . pravastatin (PRAVACHOL) 40 MG tablet Take 1 tablet (40 mg total) by mouth every evening. 90 tablet 3  . warfarin (COUMADIN) 5 MG tablet TAKE 1 TABLET AS DIRECTED 120 tablet 1  . furosemide (LASIX) 40 MG tablet ONE TABLET IN THE MORNING AND 1/2 TABLET IN THE AFTERNOON 180 tablet 4   No facility-administered medications prior to visit.    No Known Allergies  ROS As per HPI  PE: Blood pressure 142/75, pulse 86, temperature 97.4 F (36.3 C), temperature source Oral, resp. rate 16, height 5' 7.5" (1.715 m), weight 201 lb (91.173 kg), SpO2 91 %. 2.5 L oxygen nasal cannulae Gen: Alert, well appearing.  Patient is oriented to person, place, time, and situation. NTI:RWER: no injection, icteris, swelling, or exudate.  EOMI, PERRLA. Mouth: lips without lesion/swelling.  Oral mucosa pink and moist. Oropharynx without erythema, exudate, or swelling.  CV: irreg irreg, no m/r.  Rate about 80. LUNGS: trace early insp crackles at the very base of both lungs only, otherwise clear and with moderately good aeration and nonlabored resps. EXT: no cyanosis.  He has 1+ pitting edema in both LL's.  LABS:  Lab Results  Component Value Date   TSH 3.24 12/09/2014    Lab Results  Component Value Date   HGBA1C 6.1 12/26/2012      Chemistry      Component Value Date/Time   NA 137 05/28/2015 1042   K 3.8 05/28/2015 1042   CL 104 05/28/2015 1042   CO2 27 05/28/2015 1042   BUN 14 05/28/2015 1042   CREATININE 1.33 05/28/2015 1042      Component Value Date/Time   CALCIUM 9.2 05/28/2015 1042   ALKPHOS 74 11/11/2014 1012   AST 13 11/11/2014 1012   ALT 22 11/11/2014 1012   BILITOT 0.7 11/11/2014 1012     Lab Results  Component Value Date   WBC 7.5 01/01/2015   HGB 13.0 01/01/2015   HCT 39.8 01/01/2015   MCV 89.8 01/01/2015    PLT 158 01/01/2015   Lab Results  Component Value Date   CHOL 133 09/30/2014   HDL 36.80* 09/30/2014   LDLCALC 82 09/30/2014   TRIG 69.0 09/30/2014   CHOLHDL 4 09/30/2014   Lab Results  Component Value Date   INR 2.6 05/08/2015   INR 2.4 03/25/2015   INR 2.1 02/25/2015   PROTIME 18.5 03/10/2009   PROTIME 15.9 02/26/2009   PROTIME 14.2 02/20/2009    IMPRESSION  AND PLAN:  1) Acute on chronic diastolic CHF: due to noncompliance with diuretic. He is now back on diuretic and has essentially returned to his baseline per pt and wife. Strongly recommended pt adhere to instructed med regimen (40 lasix qd alt w/20 qd--as per Dr. Jacalyn Lefevre most recent progress note).  Lytes/cr on this diuretic regimen good on 05/28/15.  2) COPD; The current medical regimen is effective;  continue present plan and medications.  3) A-fib: good rate control.  Continue anticoag via his cardiologist.  Last INR therapeutic 05/08/15.  4) Preventative health care: Tdap and high dose flu vaccine given today.  An After Visit Summary was printed and given to the patient.  FOLLOW UP: Return in about 4 months (around 10/05/2015) for routine chronic illness f/u (30 min--fasting).

## 2015-06-05 NOTE — Addendum Note (Signed)
Addended by: Lanae Crumbly on: 06/05/2015 02:31 PM   Modules accepted: Orders

## 2015-06-05 NOTE — Progress Notes (Signed)
Pre visit review using our clinic review tool, if applicable. No additional management support is needed unless otherwise documented below in the visit note. 

## 2015-06-12 ENCOUNTER — Ambulatory Visit (INDEPENDENT_AMBULATORY_CARE_PROVIDER_SITE_OTHER): Payer: Medicare Other | Admitting: Emergency Medicine

## 2015-06-12 ENCOUNTER — Encounter: Payer: Self-pay | Admitting: Emergency Medicine

## 2015-06-12 VITALS — BP 116/62 | HR 64 | Ht 66.0 in | Wt 200.0 lb

## 2015-06-12 DIAGNOSIS — I2510 Atherosclerotic heart disease of native coronary artery without angina pectoris: Secondary | ICD-10-CM | POA: Diagnosis not present

## 2015-06-12 DIAGNOSIS — J438 Other emphysema: Secondary | ICD-10-CM | POA: Diagnosis not present

## 2015-06-12 MED ORDER — ALBUTEROL SULFATE HFA 108 (90 BASE) MCG/ACT IN AERS: 1.0000 | INHALATION_SPRAY | Freq: Four times a day (QID) | RESPIRATORY_TRACT | Status: DC | PRN

## 2015-06-12 MED ORDER — TIOTROPIUM BROMIDE-OLODATEROL 2.5-2.5 MCG/ACT IN AERS: 2.0000 | INHALATION_SPRAY | Freq: Every day | RESPIRATORY_TRACT | Status: DC

## 2015-06-12 NOTE — Assessment & Plan Note (Signed)
He has been tried on several long-acting medications, he also uses Xopenex on a schedule. He feels that the thing that helps and the most is his albuterol HFA and he uses this around the clock, which defeats the purpose of being on Xopenex for chronotropic control. The only other option for long-acting medication at this time is stiolto so we will do a trial of this to see if he benefits. If not we will go back to his current nebulized regimen, consider changing the Xopenex back to albuterol depending on cardiology's opinion

## 2015-06-12 NOTE — Addendum Note (Signed)
Addended by: Desmond Dike C on: 06/12/2015 09:49 AM   Modules accepted: Orders

## 2015-06-12 NOTE — Progress Notes (Signed)
Subjective:    Patient ID: [name], male    DOB: [birth year], 79 y.o.   MRN: [id number]  HPI 79 year old man, former smoker (90 pack years), with a history of mitral valve repair, atrial fibrillation (status post Maze procedure), coronary artery disease, history of stroke. He has been followed in our office by Dr Gwenette Greet for COPD and chronic hypoxemic respiratory failure. He has been managed on budesonide nebulized twice a day, Xopenex nebulized 4 times a day on schedule. He feels that these help him. He uses albuterol HFA 3-4x a day. Wakes up at night once to take SABA. He was treated for an AE in January '16. He does not wheeze. He is limited activity, wants to be able to go fishing. He is wearing 2L/min at all time.    CAT Score 10/28/2013  Total CAT Score 23    Review of Systems As per HPI  Past Medical History  Diagnosis Date  . Mediastinal lymphadenopathy   . History of prostate cancer remote past    f/u by Dr. Claris Che  . Atrial fibrillation     Rate control + Coumadin  . COPD (chronic obstructive pulmonary disease)     oxygen-dependent (2.5 L 24/7)  . Adrenal adenoma   . Warthin's tumor   . Dyslipidemia   . Diverticulosis   . Embolic stroke     d/t endocarditis 2008  . Atrial flutter   . Ulcerative colitis     left-sided/segmental, associated with diverticulosis.  Sulfasalazine per GI (Dr. Carlean Purl).  Marland Kitchen HTN (hypertension)   . Thrombocytopenia   . Right thyroid nodule   . Hemorrhoids   . TIA (transient ischemic attack) 03/25/2011    ? of due to transient diplopia  . CAD (coronary artery disease)     Lexiscan Myoview (01/2014): Lateral soft tissue attenuation, no ischemia, not gated, low risk  . CAP (community acquired pneumonia)     Hospitalized 10/2014  . History of mitral valve replacement   . Chronic renal insufficiency, stage III (moderate)     CrCl 50s  . Pulmonary hypertension 01/09/2015    secondary     Family History  Problem Relation Age of Onset  .  Stroke Father   . Heart disease Father   . Cancer Mother     spinal  . Colon cancer Neg Hx   . Heart attack Neg Hx      Social History   Social History  . Marital Status: Married    Spouse Name: Jeanett Schlein  . Number of Children: 2  . Years of Education: N/A   Occupational History  . RETIRED     [employer]   .     Social History Main Topics  . Smoking status: Former Smoker -- 3.00 packs/day for 60 years    Types: Cigarettes    Quit date: 09/19/2002  . Smokeless tobacco: Never Used  . Alcohol Use: No  . Drug Use: No  . Sexual Activity: Not on file   Other Topics Concern  . Not on file   Social History Narrative   Married, 2 children.   Retired Systems developer from [employer]   180 pack-year tob hx.  No alcohol.        No Known Allergies   Outpatient Prescriptions Prior to Visit  Medication Sig Dispense Refill  . budesonide (PULMICORT) 0.5 MG/2ML nebulizer solution Take 2 mLs (0.5 mg total) by nebulization 2 (two) times daily. 60 mL 0  . digoxin (LANOXIN) 0.125 MG  tablet Take 1 tablet (0.125 mg total) by mouth daily. 90 tablet 3  . diltiazem (CARDIZEM CD) 120 MG 24 hr capsule Take 1 capsule (120 mg total) by mouth daily. 90 capsule 3  . diltiazem (CARDIZEM CD) 240 MG 24 hr capsule Take 1 capsule (240 mg total) by mouth daily. 90 capsule 3  . furosemide (LASIX) 40 MG tablet 1 tab po qd alt with 1/2 tab po qd 180 tablet 4  . levalbuterol (XOPENEX) 0.63 MG/3ML nebulizer solution Take 3 mLs (0.63 mg total) by nebulization 4 (four) times daily. DX J43.9 360 mL 6  . LORazepam (ATIVAN) 0.5 MG tablet Take 1 tablet (0.5 mg total) by mouth 2 (two) times daily as needed for anxiety. 30 tablet 1  . pravastatin (PRAVACHOL) 40 MG tablet Take 1 tablet (40 mg total) by mouth every evening. 90 tablet 3  . warfarin (COUMADIN) 5 MG tablet TAKE 1 TABLET AS DIRECTED 120 tablet 1  . albuterol (PROVENTIL HFA;VENTOLIN HFA) 108 (90 BASE) MCG/ACT inhaler Inhale 1 puff into the lungs every 6 (six)  hours as needed for wheezing or shortness of breath (using 5-6 times every 24 hrs).    . levalbuterol (XOPENEX HFA) 45 MCG/ACT inhaler Inhale 2 puffs into the lungs every 4 (four) hours as needed for wheezing. 3 Inhaler 1   No facility-administered medications prior to visit.         Objective:   Physical Exam Filed Vitals:   06/12/15 0913  BP: 116/62  Pulse: 64  Height: 5\' 6"  (1.676 m)  Weight: 200 lb (90.719 kg)  SpO2: 95%   Gen: Pleasant, elderly gentleman, in no distress,  normal affect on 2 L/m  ENT: No lesions,  mouth clear,  oropharynx clear, no postnasal drip  Neck: No JVD, no stridor  Lungs: No use of accessory muscles, clear without rales or rhonchi, no wheeze on forced expiration  Cardiovascular: distant, irregular, no murmur or gallops, no peripheral edema  Musculoskeletal: No deformities, no cyanosis or clubbing  Neuro: alert, non focal  Skin: Warm, bruising on both deltoids      Assessment & Plan:  COPD (chronic obstructive pulmonary disease) with emphysema He has been tried on several long-acting medications, he also uses Xopenex on a schedule. He feels that the thing that helps and the most is his albuterol HFA and he uses this around the clock, which defeats the purpose of being on Xopenex for chronotropic control. The only other option for long-acting medication at this time is stiolto so we will do a trial of this to see if he benefits. If not we will go back to his current nebulized regimen, consider changing the Xopenex back to albuterol depending on cardiology's opinion

## 2015-06-12 NOTE — Patient Instructions (Signed)
We will do a trial of starting Stiolto, 2 puffs once a day to see if this medication helps you  Temporarily stop the Xopenex (green) nebulizer while we use the new medication.  Continue your budesonide (red) nebulizer twice a day. Rinse your mouth and gargle after using this medicine Continue to have Ventolin inhaler available to use 2 puffs if needed for shortness of breath Start guaifenesin 600 mg once a day. This will help with your cough and mucus clearance Flu shot is up-to-date Follow with Dr Lamonte Sakai in 1 month To discuss your status on the new medication

## 2015-06-22 ENCOUNTER — Ambulatory Visit (INDEPENDENT_AMBULATORY_CARE_PROVIDER_SITE_OTHER): Payer: Medicare Other | Admitting: *Deleted

## 2015-06-22 DIAGNOSIS — I4892 Unspecified atrial flutter: Secondary | ICD-10-CM

## 2015-06-22 DIAGNOSIS — Z5181 Encounter for therapeutic drug level monitoring: Secondary | ICD-10-CM | POA: Diagnosis not present

## 2015-06-22 DIAGNOSIS — I482 Chronic atrial fibrillation, unspecified: Secondary | ICD-10-CM

## 2015-06-22 DIAGNOSIS — Z8679 Personal history of other diseases of the circulatory system: Secondary | ICD-10-CM | POA: Diagnosis not present

## 2015-06-22 DIAGNOSIS — Z7901 Long term (current) use of anticoagulants: Secondary | ICD-10-CM | POA: Diagnosis not present

## 2015-06-22 LAB — POCT INR: INR: 2.1

## 2015-07-17 ENCOUNTER — Encounter: Payer: Self-pay | Admitting: Emergency Medicine

## 2015-07-17 ENCOUNTER — Ambulatory Visit (INDEPENDENT_AMBULATORY_CARE_PROVIDER_SITE_OTHER): Payer: Medicare Other | Admitting: Emergency Medicine

## 2015-07-17 VITALS — BP 112/54 | HR 50 | Ht 66.5 in | Wt 204.0 lb

## 2015-07-17 DIAGNOSIS — I2510 Atherosclerotic heart disease of native coronary artery without angina pectoris: Secondary | ICD-10-CM

## 2015-07-17 DIAGNOSIS — J438 Other emphysema: Secondary | ICD-10-CM

## 2015-07-17 DIAGNOSIS — J9611 Chronic respiratory failure with hypoxia: Secondary | ICD-10-CM | POA: Insufficient documentation

## 2015-07-17 MED ORDER — ALBUTEROL SULFATE HFA 108 (90 BASE) MCG/ACT IN AERS: 1.0000 | INHALATION_SPRAY | Freq: Four times a day (QID) | RESPIRATORY_TRACT | Status: DC | PRN

## 2015-07-17 MED ORDER — TIOTROPIUM BROMIDE-OLODATEROL 2.5-2.5 MCG/ACT IN AERS: 2.0000 | INHALATION_SPRAY | Freq: Every day | RESPIRATORY_TRACT | Status: DC

## 2015-07-17 NOTE — Progress Notes (Signed)
   Subjective:    Patient ID: [name], male    DOB: 02-Mar-1936, 79 y.o.   MRN: [id number]  HPI 79 year old man, former smoker (90 pack years), with a history of mitral valve repair, atrial fibrillation (status post Maze procedure), coronary artery disease, history of stroke. He has been followed in our office by Dr Gwenette Greet for COPD and chronic hypoxemic respiratory failure. He has been managed on budesonide nebulized twice a day, Xopenex nebulized 4 times a day on schedule. He feels that these help him. He uses albuterol HFA 3-4x a day. Wakes up at night once to take SABA. He was treated for an AE in January '16. He does not wheeze. He is limited activity, wants to be able to go fishing. He is wearing 2L/min at all time.   ROV 07/17/15 -- follow-up visit for dyspnea in the setting of severe COPD and chronic hypoxemic respiratory failure. He also has a history of atrial fibrillation, coronary artery disease, mitral valve repair. At our last visit we did a trial of replacing scheduled nebulized Xopenex with Stiolto. He believes that his breathing is close to the same, but he does like the fact that he only has to take it once a day. He tells me that he occasionally goes without his O2 with exertion. Uses ventolin about tid.    CAT Score 10/28/2013  Total CAT Score 23    Review of Systems As per HPI     Objective:   Physical Exam Filed Vitals:   07/17/15 0937 07/17/15 0938  BP:  112/54  Pulse:  50  Height: 5' 6.5" (1.689 m)   Weight: 204 lb (92.534 kg)   SpO2:  92%   Gen: Pleasant, elderly gentleman, in no distress,  normal affect on 2 L/m  ENT: No lesions,  mouth clear,  oropharynx clear, no postnasal drip  Neck: No JVD, no stridor  Lungs: No use of accessory muscles, clear without rales or rhonchi, no wheeze on forced expiration  Cardiovascular: distant, irregular, no murmur or gallops, no peripheral edema  Musculoskeletal: No deformities, no cyanosis or clubbing  Neuro:  alert, non focal  Skin: Warm, bruising on both deltoids      Assessment & Plan:  COPD (chronic obstructive pulmonary disease) with emphysema He tolerated the change to Stiolto, feels that he has not lost any ground. Prefers fact that he only has to take it once daily. Continue current regimen. Explained to him today the benefits of wearing his oxygen reliably particularly with exertion. Flu shot up-to-date. prevnar UTD  Chronic hypoxemic respiratory failure ([place]) Discussed good option compliance with him today

## 2015-07-17 NOTE — Patient Instructions (Addendum)
Please continue your Stiolto 2 puffs once a day Use Ventolin 2 puffs up to every 4 hours if needed for shortness of breath.  Continue budesonide twice a day  Wear your oxygen at all times, especially with exertion.  Flu shot up to date He had the Prevnar-13 in March 2016.  Follow with Dr Lamonte Sakai in 6 months or sooner if you have any problems

## 2015-07-17 NOTE — Assessment & Plan Note (Signed)
Discussed good option compliance with him today

## 2015-07-17 NOTE — Assessment & Plan Note (Signed)
He tolerated the change to [facility], feels that he has not lost any ground. Prefers fact that he only has to take it once daily. Continue current regimen. Explained to him today the benefits of wearing his oxygen reliably particularly with exertion. Flu shot up-to-date. prevnar UTD

## 2015-07-29 ENCOUNTER — Ambulatory Visit (INDEPENDENT_AMBULATORY_CARE_PROVIDER_SITE_OTHER): Payer: Medicare Other | Admitting: Surgery

## 2015-07-29 DIAGNOSIS — Z7901 Long term (current) use of anticoagulants: Secondary | ICD-10-CM

## 2015-07-29 DIAGNOSIS — Z5181 Encounter for therapeutic drug level monitoring: Secondary | ICD-10-CM

## 2015-07-29 DIAGNOSIS — I4892 Unspecified atrial flutter: Secondary | ICD-10-CM | POA: Diagnosis not present

## 2015-07-29 DIAGNOSIS — Z8679 Personal history of other diseases of the circulatory system: Secondary | ICD-10-CM | POA: Diagnosis not present

## 2015-07-29 DIAGNOSIS — I482 Chronic atrial fibrillation, unspecified: Secondary | ICD-10-CM

## 2015-07-29 LAB — POCT INR: INR: 2.6

## 2015-08-14 DIAGNOSIS — Z91048 Other nonmedicinal substance allergy status: Secondary | ICD-10-CM | POA: Diagnosis not present

## 2015-08-14 DIAGNOSIS — J449 Chronic obstructive pulmonary disease, unspecified: Secondary | ICD-10-CM | POA: Diagnosis not present

## 2015-08-14 DIAGNOSIS — J310 Chronic rhinitis: Secondary | ICD-10-CM | POA: Diagnosis not present

## 2015-08-14 DIAGNOSIS — R0981 Nasal congestion: Secondary | ICD-10-CM | POA: Diagnosis not present

## 2015-08-17 ENCOUNTER — Encounter: Payer: Self-pay | Admitting: Family Medicine

## 2015-08-17 ENCOUNTER — Ambulatory Visit (INDEPENDENT_AMBULATORY_CARE_PROVIDER_SITE_OTHER): Payer: Medicare Other | Admitting: Family Medicine

## 2015-08-17 VITALS — BP 135/75 | HR 60 | Temp 98.9°F | Resp 18 | Ht 67.5 in | Wt 202.0 lb

## 2015-08-17 DIAGNOSIS — I2510 Atherosclerotic heart disease of native coronary artery without angina pectoris: Secondary | ICD-10-CM

## 2015-08-17 DIAGNOSIS — J0100 Acute maxillary sinusitis, unspecified: Secondary | ICD-10-CM | POA: Diagnosis not present

## 2015-08-17 DIAGNOSIS — H5789 Other specified disorders of eye and adnexa: Secondary | ICD-10-CM | POA: Insufficient documentation

## 2015-08-17 DIAGNOSIS — H578 Other specified disorders of eye and adnexa: Secondary | ICD-10-CM | POA: Diagnosis not present

## 2015-08-17 DIAGNOSIS — J320 Chronic maxillary sinusitis: Secondary | ICD-10-CM | POA: Insufficient documentation

## 2015-08-17 MED ORDER — OLOPATADINE HCL 0.1 % OP SOLN: 1.0000 [drp] | Freq: Two times a day (BID) | OPHTHALMIC | Status: DC

## 2015-08-17 MED ORDER — AMOXICILLIN-POT CLAVULANATE 875-125 MG PO TABS: 1.0000 | ORAL_TABLET | Freq: Two times a day (BID) | ORAL | Status: DC

## 2015-08-17 NOTE — Patient Instructions (Signed)
Sinusitis, Adult Sinusitis is redness, soreness, and inflammation of the paranasal sinuses. Paranasal sinuses are air pockets within the bones of your face. They are located beneath your eyes, in the middle of your forehead, and above your eyes. In healthy paranasal sinuses, mucus is able to drain out, and air is able to circulate through them by way of your nose. However, when your paranasal sinuses are inflamed, mucus and air can become trapped. This can allow bacteria and other germs to grow and cause infection. Sinusitis can develop quickly and last only a short time (acute) or continue over a long period (chronic). Sinusitis that lasts for more than 12 weeks is considered chronic. CAUSES Causes of sinusitis include:  Allergies.  Structural abnormalities, such as displacement of the cartilage that separates your nostrils (deviated septum), which can decrease the air flow through your nose and sinuses and affect sinus drainage.  Functional abnormalities, such as when the small hairs (cilia) that line your sinuses and help remove mucus do not work properly or are not present. SIGNS AND SYMPTOMS Symptoms of acute and chronic sinusitis are the same. The primary symptoms are pain and pressure around the affected sinuses. Other symptoms include:  Upper toothache.  Earache.  Headache.  Bad breath.  Decreased sense of smell and taste.  A cough, which worsens when you are lying flat.  Fatigue.  Fever.  Thick drainage from your nose, which often is green and may contain pus (purulent).  Swelling and warmth over the affected sinuses. DIAGNOSIS Your health care provider will perform a physical exam. During your exam, your health care provider may perform any of the following to help determine if you have acute sinusitis or chronic sinusitis:  Look in your nose for signs of abnormal growths in your nostrils (nasal polyps).  Tap over the affected sinus to check for signs of  infection.  View the inside of your sinuses using an imaging device that has a light attached (endoscope). If your health care provider suspects that you have chronic sinusitis, one or more of the following tests may be recommended:  Allergy tests.  Nasal culture. A sample of mucus is taken from your nose, sent to a lab, and screened for bacteria.  Nasal cytology. A sample of mucus is taken from your nose and examined by your health care provider to determine if your sinusitis is related to an allergy. TREATMENT Most cases of acute sinusitis are related to a viral infection and will resolve on their own within 10 days. Sometimes, medicines are prescribed to help relieve symptoms of both acute and chronic sinusitis. These may include pain medicines, decongestants, nasal steroid sprays, or saline sprays. However, for sinusitis related to a bacterial infection, your health care provider will prescribe antibiotic medicines. These are medicines that will help kill the bacteria causing the infection. Rarely, sinusitis is caused by a fungal infection. In these cases, your health care provider will prescribe antifungal medicine. For some cases of chronic sinusitis, surgery is needed. Generally, these are cases in which sinusitis recurs more than 3 times per year, despite other treatments. HOME CARE INSTRUCTIONS  Drink plenty of water. Water helps thin the mucus so your sinuses can drain more easily.  Use a humidifier.  Inhale steam 3-4 times a day (for example, sit in the bathroom with the shower running).  Apply a warm, moist washcloth to your face 3-4 times a day, or as directed by your health care provider.  Use saline nasal sprays to help   moisten and clean your sinuses.  Take medicines only as directed by your health care provider.  If you were prescribed either an antibiotic or antifungal medicine, finish it all even if you start to feel better. SEEK IMMEDIATE MEDICAL CARE IF:  You have  increasing pain or severe headaches.  You have nausea, vomiting, or drowsiness.  You have swelling around your face.  You have vision problems.  You have a stiff neck.  You have difficulty breathing.   This information is not intended to replace advice given to you by your health care provider. Make sure you discuss any questions you have with your health care provider.   Document Released: 09/05/2005 Document Revised: 09/26/2014 Document Reviewed: 09/20/2011 Elsevier Interactive Patient Education 2016 Elsevier Inc[employer]   Uveitis Uveitis is swelling and irritation (inflammation) in the eye. It often affects the middle part of the eye (uvea). This area contains many of the blood vessels that supply the rest of the eye. The uvea is made up of three structures:  The middle layer of the eyeball (choroid).  The colored part of the front of the eye (iris).  The connection between the iris and the choroid (ciliary body). Uveitis can affect any part of the uvea as well as other important structures in the eye. There are many types of uveitis:  Iritis, or anterior uveitis, affects the iris. This is the most common type. It can start suddenly and can last for many weeks.  Intermediate uveitis affects the structures in the middle of the eye. This includes the fluid that fills the eye (vitreous). This type can last for years and can come and go.  Posterior uveitis affects the structures in the back of the eye. This includes the light-sensitive layer of cells that is needed for vision (retina). This is the least common type.  Panuveitis affects all layers of the eye. Uveitis can affect one eye or both eyes. It can cause short-term or long-term symptoms. Symptoms may go away and come back. Over time, the condition can damage or destroy eye structures and can lead to vision loss. CAUSES This condition may be caused by:  Infections that start in the eye or spread to the eye.  Inflammatory  diseases that can affect the eye.  Autoimmune diseases. These are diseases in which the body's defense system (immune system) mistakenly attacks the body's own tissues.  Eye injuries. In some cases, the cause may not be known. RISK FACTORS This condition is more likely to develop in people who are 46-77 years old. SYMPTOMS Symptoms of this condition depend on the type of uveitis. Common symptoms include:  Eye redness.  Eye pain.  Blurred vision.  Decreased vision.  Floating dark spots in your vision (floaters).  Sensitivity to light.  A white spot in the lower part of the iris (hypopyon). DIAGNOSIS This condition is usually diagnosed by an eye specialist (ophthalmologist). The ophthalmologist will do a complete eye exam. This exam may include:  A vision test using eye charts.  An exam that involves using a scope for viewing inside the eye (ophthalmoscope or slit lamp). Eye drops may be used to widen (dilate) your pupil to make it easier to see inside your eye.  A test to measure eye pressure. You may have other medical tests to help determine the cause of your uveitis. TREATMENT Treatment for this condition may depend on the type of uveitis that you have. It should be started right away to help prevent vision loss.  Treatment may include:  Medicine to block inflammation (steroids). You may get steroids:  As eye drops.  As an injection into your eye.  By mouth.  Eye drops to dilate the pupil and reduce pressure inside the eye.  In some cases, medicines may be given through an IV tube or through a device that is implanted inside the eye. HOME CARE INSTRUCTIONS  Take medicines only as directed by your health care provider. Use eye drops exactly as directed.  Follow instructions from your health care provider about any restrictions on your activities. Ask what activities are safe for you.  Do not use any tobacco products, including cigarettes, chewing tobacco, or  electronic cigarettes. If you need help quitting, ask your health care provider.  Keep all follow-up visits as directed by your health care provider. This is important. SEEK MEDICAL CARE IF:  Your medicines are not working. SEEK IMMEDIATE MEDICAL CARE IF:  You have redness in one eye or both eyes.  Your eyes are very sensitive to light.  You have pain or aching in either eye.  You have vision loss in either eye.   This information is not intended to replace advice given to you by your health care provider. Make sure you discuss any questions you have with your health care provider.   Document Released: 12/02/2008 Document Revised: 01/20/2015 Document Reviewed: 09/10/2014 Elsevier Interactive Patient Education [employer].

## 2015-08-17 NOTE — Progress Notes (Signed)
Pre visit review using our clinic review tool, if applicable. No additional management support is needed unless otherwise documented below in the visit note. 

## 2015-08-17 NOTE — Progress Notes (Signed)
Subjective:    Patient ID: [name], male    DOB: 1936/05/17, 79 y.o.   MRN: [id number]  HPI   Cough: Patient presents for an acute office visit with complaints of cough. He states that his eyes are "burning" as well. He was seen in a clinic near [place] for the above condition Friday and was given a steroid shot. He states since then he continues to cough and is coughing up phlegm with streaks of blood. He reports that his nosebleeds frequently. His eyes are continuing to burn. He denies any fever, chills, nausea, vomiting or diarrhea. He is eating and drinking well. He reports intermittent blurred vision. He is using his albuterol 5-6 times a day. He is uncertain which inhalers he uses daily. He is using Mucinex. He is a former smoker.  Past Medical History  Diagnosis Date  . Mediastinal lymphadenopathy   . History of prostate cancer remote past    f/u by Dr. Claris Che  . Atrial fibrillation (HCC)     Rate control + Coumadin  . COPD (chronic obstructive pulmonary disease) (HCC)     oxygen-dependent (2.5 L 24/7)  . Adrenal adenoma   . Warthin's tumor   . Dyslipidemia   . Diverticulosis   . Embolic stroke ([place])     d/t endocarditis 2008  . Atrial flutter ([place])   . Ulcerative colitis     left-sided/segmental, associated with diverticulosis.  Sulfasalazine per GI (Dr. Carlean Purl).  Marland Kitchen HTN (hypertension)   . Thrombocytopenia ([place])   . Right thyroid nodule   . Hemorrhoids   . TIA (transient ischemic attack) 03/25/2011    ? of due to transient diplopia  . CAD (coronary artery disease)     Lexiscan Myoview (01/2014): Lateral soft tissue attenuation, no ischemia, not gated, low risk  . CAP (community acquired pneumonia)     Hospitalized 10/2014  . History of mitral valve replacement   . Chronic renal insufficiency, stage III (moderate)     CrCl 50s  . Pulmonary hypertension ([place]) 01/09/2015    secondary   No Known Allergies   Review of Systems Negative, with the exception of  above mentioned in HPI     Objective:   Physical Exam BP 135/75 mmHg  Pulse 60  Temp(Src) 98.9 F (37.2 C) (Temporal)  Resp 18  Ht 5' 7.5" (1.715 m)  Wt 202 lb (91.627 kg)  BMI 31.15 kg/m2  SpO2 95% Gen: Afebrile. No acute distress. Nontoxic in appearance, well-developed, well-nourished, Caucasian male on portable oxygen. HENT: AT. [place]. Bilateral TM visualized and normal in appearance. MMM, no oral lesions. Bilateral nares with erythema, swelling, dry blood in excoriations. Throat without erythema or exudates. No cough on exam. No hoarseness on exam. Eyes:Pupils Equal Round Reactive to light, Extraocular movements intact,  Conjunctiva with redness (diffusely). No discharge or icterus. Appear watery. Mild pain with light right eye. Neck/lymp/endocrine: Supple, no lymphadenopathy CV: RRR, no edema Chest: CTAB, no wheeze or crackles. Mildly diminished breath sounds bilaterally.  Neuro: Normal gait. PERLA. EOMi. Alert. Oriented x3.   Assessment & Plan:  1. Acute maxillary sinusitis, recurrence not specified - Encourage patient to use nasal saline. Multiple excoriations left nasal passage. - Continue Mucinex. - amoxicillin-clavulanate (AUGMENTIN) 875-125 MG tablet; Take 1 tablet by mouth 2 (two) times daily.  Dispense: 20 tablet; Refill: 0   2. Eye irritation - eye examined today with mild discomfort with light. Bilateral eyes with  Injections. Pt describes it as "burning". Discussed with patient an  wife today the possibility of allergies vs more serious eye conditions warranting evaluation ophthalmologist.  - Pt encouraged them to make an eye appt to occur with a 1-2 days to follow up with ophthalmologist if no improvement with drops.  - olopatadine (PATANOL) 0.1 % ophthalmic solution; Place 1 drop into both eyes 2 (two) times daily.  Dispense: 5 mL; Refill: 12

## 2015-08-19 DIAGNOSIS — H01001 Unspecified blepharitis right upper eyelid: Secondary | ICD-10-CM | POA: Diagnosis not present

## 2015-08-19 DIAGNOSIS — H43813 Vitreous degeneration, bilateral: Secondary | ICD-10-CM | POA: Diagnosis not present

## 2015-08-19 DIAGNOSIS — H26492 Other secondary cataract, left eye: Secondary | ICD-10-CM | POA: Diagnosis not present

## 2015-08-19 DIAGNOSIS — H01004 Unspecified blepharitis left upper eyelid: Secondary | ICD-10-CM | POA: Diagnosis not present

## 2015-09-02 ENCOUNTER — Ambulatory Visit (INDEPENDENT_AMBULATORY_CARE_PROVIDER_SITE_OTHER): Payer: Medicare Other | Admitting: Family Medicine

## 2015-09-02 ENCOUNTER — Encounter: Payer: Self-pay | Admitting: Family Medicine

## 2015-09-02 VITALS — BP 129/72 | HR 72 | Temp 98.5°F | Resp 18 | Ht 67.5 in | Wt 197.0 lb

## 2015-09-02 DIAGNOSIS — I2510 Atherosclerotic heart disease of native coronary artery without angina pectoris: Secondary | ICD-10-CM

## 2015-09-02 DIAGNOSIS — L299 Pruritus, unspecified: Secondary | ICD-10-CM

## 2015-09-02 NOTE — Progress Notes (Signed)
Pre visit review using our clinic review tool, if applicable. No additional management support is needed unless otherwise documented below in the visit note. 

## 2015-09-02 NOTE — Patient Instructions (Signed)
I think you are either having a reaction to your fish oil supplement or this is dry skin.  You should stop fish oil and see if improves. You can also try to use cetaphil or aveeno extreme dry skin lotion after pat drying from your shower to soothe the skin. Use vinegar/water rinses 2 times a day if area becomes more irritated.  If you notice a rash/redness use over the counter hydrocortisone cream a few times a day.

## 2015-09-02 NOTE — Progress Notes (Signed)
Subjective:    Patient ID: [name], male    DOB: [birth year], 79 y.o.   MRN: [id number]  HPI   Pruitis: pt presents for itchy underarms, hands and inguinal folds  for 3 days. Patient does not endorse change in cologne, detergent, soaps, shampoo or new clothes/sheets etc. He was recently treated and finished augmentin 3 days prior to onset. He endorses start of fish oil supplementation 1-2 days prior to onset of itchiness. He denies redness, pain, rash or any other family members that are itching. Patient feels the rash is worse in the morning once he gets moving. He denies pruitis after showers or change in weather. He denies any skin changes. He has not tried anything. He has had a similar presentation over 20 years ago after a medication but he does not recall which one. He has had itchy dry scalp for 5 years and uses baby oil daily. He has had Augmentin a few years ago and did not have itchiness.  No other areas affected, no SOB, No tongue itchiness or swelling.  Former smoker Past Medical History  Diagnosis Date  . Mediastinal lymphadenopathy   . History of prostate cancer remote past    f/u by Dr. Claris Che  . Atrial fibrillation (HCC)     Rate control + Coumadin  . COPD (chronic obstructive pulmonary disease) (HCC)     oxygen-dependent (2.5 L 24/7)  . Adrenal adenoma   . Warthin's tumor   . Dyslipidemia   . Diverticulosis   . Embolic stroke ([place])     d/t endocarditis 2008  . Atrial flutter ([place])   . Ulcerative colitis     left-sided/segmental, associated with diverticulosis.  Sulfasalazine per GI (Dr. Carlean Purl).  Marland Kitchen HTN (hypertension)   . Thrombocytopenia ([place])   . Right thyroid nodule   . Hemorrhoids   . TIA (transient ischemic attack) 03/25/2011    ? of due to transient diplopia  . CAD (coronary artery disease)     Lexiscan Myoview (01/2014): Lateral soft tissue attenuation, no ischemia, not gated, low risk  . CAP (community acquired pneumonia)     Hospitalized 10/2014    . History of mitral valve replacement   . Chronic renal insufficiency, stage III (moderate)     CrCl 50s  . Pulmonary hypertension ([place]) 01/09/2015    secondary   No Known Allergies   Review of Systems Negative, with the exception of above mentioned in HPI     Objective:   Physical Exam BP 129/72 mmHg  Pulse 72  Temp(Src) 98.5 F (36.9 C) (Temporal)  Resp 18  Ht 5' 7.5" (1.715 m)  Wt 197 lb (89.359 kg)  BMI 30.38 kg/m2  SpO2 96% Gen: Afebrile. No acute distress. No acute distress, nontoxic in appearance, well-developed, well-nourished Caucasian male. Very pleasant. HENT: AT. [place].  MMM.  Eyes:Pupils Equal Round Reactive to light, Extraocular movements intact,  Conjunctiva without redness, discharge or icterus.ly Skin: No rashes, purpura or petechiae. No jaundice. No erythema, no soft tissue swelling, no signs of irritation, no signs of burrowing. Skin intact. Dry skin.    Assessment & Plan:  1. Pruritus:  -Uncertain etiology of pruritus. No signs of any rash or irritation on exam today. - Possibly reaction due to start of fish oil supplementation, asked patient to discontinue fish oil (he is on a statin). - Also considered dry skin, as he does have dry scalp. Patient was encouraged to pat dry only with Towel after showering then use the Aveeno/Cetaphil  cream. - If symptoms continue discussed possibility of using vinegar/water rinses to affected areas. - Lastly could apply hydrocortisone cream to affected areas caution on overuse of steroid cream especially under her armpits and groin area.  Follow-up as needed

## 2015-09-09 ENCOUNTER — Ambulatory Visit (INDEPENDENT_AMBULATORY_CARE_PROVIDER_SITE_OTHER): Payer: Medicare Other | Admitting: *Deleted

## 2015-09-09 DIAGNOSIS — Z5181 Encounter for therapeutic drug level monitoring: Secondary | ICD-10-CM

## 2015-09-09 DIAGNOSIS — Z7901 Long term (current) use of anticoagulants: Secondary | ICD-10-CM | POA: Diagnosis not present

## 2015-09-09 DIAGNOSIS — I4892 Unspecified atrial flutter: Secondary | ICD-10-CM

## 2015-09-09 DIAGNOSIS — I482 Chronic atrial fibrillation, unspecified: Secondary | ICD-10-CM

## 2015-09-09 DIAGNOSIS — Z8679 Personal history of other diseases of the circulatory system: Secondary | ICD-10-CM

## 2015-09-09 LAB — POCT INR: INR: 1.6

## 2015-09-23 ENCOUNTER — Ambulatory Visit (INDEPENDENT_AMBULATORY_CARE_PROVIDER_SITE_OTHER): Payer: Medicare Other | Admitting: *Deleted

## 2015-09-23 DIAGNOSIS — Z5181 Encounter for therapeutic drug level monitoring: Secondary | ICD-10-CM | POA: Diagnosis not present

## 2015-09-23 DIAGNOSIS — Z8679 Personal history of other diseases of the circulatory system: Secondary | ICD-10-CM | POA: Diagnosis not present

## 2015-09-23 DIAGNOSIS — I4892 Unspecified atrial flutter: Secondary | ICD-10-CM | POA: Diagnosis not present

## 2015-09-23 DIAGNOSIS — Z7901 Long term (current) use of anticoagulants: Secondary | ICD-10-CM

## 2015-09-23 DIAGNOSIS — I482 Chronic atrial fibrillation, unspecified: Secondary | ICD-10-CM

## 2015-09-23 LAB — POCT INR: INR: 2.2

## 2015-09-24 ENCOUNTER — Telehealth: Payer: Self-pay | Admitting: Emergency Medicine

## 2015-09-24 MED ORDER — BUDESONIDE 0.5 MG/2ML IN SUSP: 0.5000 mg | Freq: Two times a day (BID) | RESPIRATORY_TRACT | Status: DC

## 2015-09-24 NOTE — Telephone Encounter (Signed)
Called spoke with spouse. Pt needs refill on budesonide neb sent to [facility]. I have done so. Nothing further needed

## 2015-10-03 ENCOUNTER — Other Ambulatory Visit: Payer: Self-pay | Admitting: Cardiology

## 2015-10-05 ENCOUNTER — Encounter: Payer: Self-pay | Admitting: Family Medicine

## 2015-10-05 ENCOUNTER — Ambulatory Visit (INDEPENDENT_AMBULATORY_CARE_PROVIDER_SITE_OTHER): Payer: Medicare Other | Admitting: Family Medicine

## 2015-10-05 VITALS — BP 115/75 | HR 70 | Temp 97.4°F | Resp 16 | Ht 67.5 in | Wt 195.2 lb

## 2015-10-05 DIAGNOSIS — J438 Other emphysema: Secondary | ICD-10-CM | POA: Diagnosis not present

## 2015-10-05 DIAGNOSIS — N183 Chronic kidney disease, stage 3 unspecified: Secondary | ICD-10-CM

## 2015-10-05 DIAGNOSIS — E7800 Pure hypercholesterolemia, unspecified: Secondary | ICD-10-CM

## 2015-10-05 DIAGNOSIS — E8881 Metabolic syndrome: Secondary | ICD-10-CM

## 2015-10-05 DIAGNOSIS — I10 Essential (primary) hypertension: Secondary | ICD-10-CM

## 2015-10-05 DIAGNOSIS — J441 Chronic obstructive pulmonary disease with (acute) exacerbation: Secondary | ICD-10-CM | POA: Diagnosis not present

## 2015-10-05 LAB — LIPID PANEL
CHOL/HDL RATIO: 4
Cholesterol: 147 mg/dL (ref 0–200)
HDL: 34.3 mg/dL — ABNORMAL LOW (ref >39.00)
LDL CALC: 88 mg/dL (ref 0–99)
NonHDL: 112.95
Triglycerides: 124 mg/dL (ref 0.0–149.0)
VLDL: 24.8 mg/dL (ref 0.0–40.0)

## 2015-10-05 LAB — COMPREHENSIVE METABOLIC PANEL
ALBUMIN: 3.9 g/dL (ref 3.5–5.2)
ALK PHOS: 83 U/L (ref 39–117)
ALT: 11 U/L (ref 0–53)
AST: 13 U/L (ref 0–37)
BUN: 16 mg/dL (ref 6–23)
CHLORIDE: 101 meq/L (ref 96–112)
CO2: 27 meq/L (ref 19–32)
CREATININE: 1.33 mg/dL (ref 0.40–1.50)
Calcium: 9.3 mg/dL (ref 8.4–10.5)
GFR: 55.3 mL/min — ABNORMAL LOW (ref >60.00)
Glucose, Bld: 114 mg/dL — ABNORMAL HIGH (ref 70–99)
POTASSIUM: 3.9 meq/L (ref 3.5–5.1)
SODIUM: 138 meq/L (ref 135–145)
Total Bilirubin: 0.9 mg/dL (ref 0.2–1.2)
Total Protein: 7.3 g/dL (ref 6.0–8.3)

## 2015-10-05 LAB — HEMOGLOBIN A1C: HEMOGLOBIN A1C: 6.3 % (ref 4.6–6.5)

## 2015-10-05 MED ORDER — LEVOFLOXACIN 500 MG PO TABS: 500.0000 mg | ORAL_TABLET | Freq: Every day | ORAL | Status: DC

## 2015-10-05 MED ORDER — PREDNISONE 20 MG PO TABS: ORAL_TABLET | ORAL | Status: DC

## 2015-10-05 NOTE — Progress Notes (Signed)
Pre visit review using our clinic review tool, if applicable. No additional management support is needed unless otherwise documented below in the visit note. 

## 2015-10-05 NOTE — Progress Notes (Signed)
OFFICE VISIT  10/07/2015   CC:  Chief Complaint  Patient presents with  . Follow-up    Pt is fasting.   . Cough    x 2 weeks   HPI:    Patient is a 80 y.o. Caucasian male who presents for 4 mo f/u HTN, oxygen-dependent COPD with cor pulmonale, CRI stage III, and HLD.   Not wearing his oxygen currently. Says he has had a couple of weeks of bad cough, a bit productive of clearish mucous, much more mucous production than normal.  SOB unchanged from his chronic.  No fever.  Has had some nasal mucous, mild HA in forehead region.  Mild ST.  Home bp monitoring has been around 120s/80s.  Oxygen in low 90s on 2L oxygen.  Tolerating pravastatin w/out side effect, reports daily compliance.    Past Medical History  Diagnosis Date  . Mediastinal lymphadenopathy   . History of prostate cancer remote past    f/u by Dr. Claris Che  . Atrial fibrillation (HCC)     Rate control + Coumadin  . COPD (chronic obstructive pulmonary disease) (HCC)     oxygen-dependent (2.5 L 24/7)  . Adrenal adenoma   . Warthin's tumor   . Dyslipidemia   . Diverticulosis   . Embolic stroke ([place])     d/t endocarditis 2008  . Atrial flutter ([place])   . Ulcerative colitis     left-sided/segmental, associated with diverticulosis.  Sulfasalazine per GI (Dr. Carlean Purl).  Marland Kitchen HTN (hypertension)   . Thrombocytopenia ([place])   . Right thyroid nodule   . Hemorrhoids   . TIA (transient ischemic attack) 03/25/2011    ? of due to transient diplopia  . CAD (coronary artery disease)     Lexiscan Myoview (01/2014): Lateral soft tissue attenuation, no ischemia, not gated, low risk  . CAP (community acquired pneumonia)     Hospitalized 10/2014  . History of mitral valve replacement   . Chronic renal insufficiency, stage III (moderate)     CrCl 50s  . Pulmonary hypertension ([place]) 01/09/2015    secondary  . Prediabetes     Past Surgical History  Procedure Laterality Date  . Mitral valve repair  09/22/04    and modified Cox-Maze IV    . Mitral valve repair  09/14/05    redo MV repair d/t endocardiits/embolic events  . Prostatectomy    . Inguinal hernia repair      right  . Cataract extraction, bilateral    . Salivary gland surgery      left resection  . Flexible sigmoidoscopy  01/11/2008; 03/19/2010    2009 and 2011:segmental left colitis, diverticulosis, hemorrhoids  . Colonoscopy w/ biopsies  12/19/2007    left-sided colitis, diverticulosis, hemorrhoids  . Carotid duplex dopplers  04/2014    No signif plaques; repeat 2 yrs per cardiology  . Transthoracic echocardiogram  11/2013    Mod LVH, EF 55-60%, no WM abnormalities, +LA dilation, +severe RV dilation and impaired systolic fxn, +increased pulm art pressures.  Valves ok.  . Right heart catheterization N/A 01/01/2015    Procedure: RIGHT HEART CATH;  Surgeon: Peter M Martinique, MD;  Location: [facility] CATH LAB;  Service: Cardiovascular;  Laterality: N/A;    Outpatient Prescriptions Prior to Visit  Medication Sig Dispense Refill  . albuterol (PROVENTIL HFA;VENTOLIN HFA) 108 (90 BASE) MCG/ACT inhaler Inhale 1 puff into the lungs every 6 (six) hours as needed for wheezing or shortness of breath (using 5-6 times every 24 hrs). 3 Inhaler 2  .  budesonide (PULMICORT) 0.5 MG/2ML nebulizer solution Take 2 mLs (0.5 mg total) by nebulization 2 (two) times daily. 120 mL 6  . digoxin (LANOXIN) 0.125 MG tablet Take 1 tablet (0.125 mg total) by mouth daily. 90 tablet 3  . diltiazem (CARDIZEM CD) 120 MG 24 hr capsule Take 1 capsule (120 mg total) by mouth daily. 90 capsule 3  . diltiazem (CARDIZEM CD) 240 MG 24 hr capsule Take 1 capsule (240 mg total) by mouth daily. 90 capsule 3  . furosemide (LASIX) 40 MG tablet 1 tab po qd alt with 1/2 tab po qd 180 tablet 4  . LORazepam (ATIVAN) 0.5 MG tablet Take 1 tablet (0.5 mg total) by mouth 2 (two) times daily as needed for anxiety. 30 tablet 1  . olopatadine (PATANOL) 0.1 % ophthalmic solution Place 1 drop into both eyes 2 (two) times daily. 5 mL 12   . pravastatin (PRAVACHOL) 40 MG tablet Take 1 tablet (40 mg total) by mouth every evening. 90 tablet 3  . Tiotropium Bromide-Olodaterol (STIOLTO RESPIMAT) 2.5-2.5 MCG/ACT AERS Inhale 2 puffs into the lungs daily. 3 Inhaler 2  . warfarin (COUMADIN) 5 MG tablet TAKE 1 TABLET AS DIRECTED 120 tablet 1  . levalbuterol (XOPENEX) 0.63 MG/3ML nebulizer solution Take 3 mLs (0.63 mg total) by nebulization 4 (four) times daily. DX J43.9 (Patient not taking: Reported on 10/05/2015) 360 mL 6   No facility-administered medications prior to visit.    No Known Allergies  ROS As per HPI  PE: Blood pressure 115/75, pulse 70, temperature 97.4 F (36.3 C), temperature source Oral, resp. rate 16, height 5' 7.5" (1.715 m), weight 195 lb 4 oz (88.565 kg), SpO2 92 %. VS: noted--normal. Gen: alert, NAD, NONTOXIC APPEARING. HEENT: eyes without injection, drainage, or swelling.  Ears: EACs clear, TMs with normal light reflex and landmarks.  Nose: Clear rhinorrhea, with some dried, crusty exudate adherent to mildly injected mucosa.  No purulent d/c.  No paranasal sinus TTP.  No facial swelling.  Throat and mouth without focal lesion.  No pharyngial swelling, erythema, or exudate.   Neck: supple, no LAD.   LUNGS: CTA bilat except slight insp crackles at L base, with diminished BS in this region as well, nonlabored resps.   CV: RRR, no m/r/g. EXT: no c/c/e SKIN: no rash  LABS:  Lab Results  Component Value Date   HGBA1C 6.3 10/05/2015      Chemistry      Component Value Date/Time   NA 138 10/05/2015 1003   K 3.9 10/05/2015 1003   CL 101 10/05/2015 1003   CO2 27 10/05/2015 1003   BUN 16 10/05/2015 1003   CREATININE 1.33 10/05/2015 1003      Component Value Date/Time   CALCIUM 9.3 10/05/2015 1003   ALKPHOS 83 10/05/2015 1003   AST 13 10/05/2015 1003   ALT 11 10/05/2015 1003   BILITOT 0.9 10/05/2015 1003     Lab Results  Component Value Date   CHOL 147 10/05/2015   HDL 34.30* 10/05/2015    LDLCALC 88 10/05/2015   TRIG 124.0 10/05/2015   CHOLHDL 4 10/05/2015   Lab Results  Component Value Date   WBC 7.5 01/01/2015   HGB 13.0 01/01/2015   HCT 39.8 01/01/2015   MCV 89.8 01/01/2015   PLT 158 01/01/2015   Lab Results  Component Value Date   TSH 3.24 12/09/2014   Lab Results  Component Value Date   INR 2.2 09/23/2015   INR 1.6 09/09/2015   INR  2.6 07/29/2015   PROTIME 18.5 03/10/2009   PROTIME 15.9 02/26/2009   PROTIME 14.2 02/20/2009   IMPRESSION AND PLAN:  1) COPD exacerbation: levaquin 500 mg qd x 10d. Prednisone 40mg  qd x 5d, then 20mg  qd x 5d. Given possible effect of levaquin on INR, will recheck pt in office in 1 wk and check PT/INR at that time.  2) Hyperlipidemia; tolerating statin.  FLP and transaminases check today.  3) History of insulin resistance: recheck HbA1c today.  4) CRI stage III: lytes/cr check today.  5) HTN: The current medical regimen is effective;  continue present plan and medications.   An After Visit Summary was printed and given to the patient.  FOLLOW UP: Return in about 1 week (around 10/12/2015) for f/u COPD exac + check BMET.

## 2015-10-06 ENCOUNTER — Encounter: Payer: Self-pay | Admitting: Family Medicine

## 2015-10-12 ENCOUNTER — Encounter: Payer: Self-pay | Admitting: Family Medicine

## 2015-10-12 ENCOUNTER — Ambulatory Visit (INDEPENDENT_AMBULATORY_CARE_PROVIDER_SITE_OTHER): Payer: Medicare Other | Admitting: Family Medicine

## 2015-10-12 VITALS — BP 131/80 | HR 84 | Temp 97.8°F | Resp 16 | Ht 67.5 in | Wt 195.2 lb

## 2015-10-12 DIAGNOSIS — Z7901 Long term (current) use of anticoagulants: Secondary | ICD-10-CM

## 2015-10-12 DIAGNOSIS — I482 Chronic atrial fibrillation, unspecified: Secondary | ICD-10-CM

## 2015-10-12 DIAGNOSIS — J441 Chronic obstructive pulmonary disease with (acute) exacerbation: Secondary | ICD-10-CM

## 2015-10-12 LAB — PROTIME-INR
INR: 2.5 ratio — ABNORMAL HIGH (ref 0.8–1.0)
Prothrombin Time: 27.3 s — ABNORMAL HIGH (ref 9.6–13.1)

## 2015-10-12 NOTE — Progress Notes (Signed)
OFFICE VISIT  10/12/2015   CC:  Chief Complaint  Patient presents with  . Follow-up    COPD. Pt is not fasting.      HPI:    Patient is a 80 y.o. Caucasian male who presents for 1 week f/u COPD exacerbation. Compliant with prednisone taper and with antibiotics. Feeling significantly improved.  Still coughing up some mucous but less and less all the time. No fevers.  Using rescue albuterol inhaler avg of once a day now, compared to 3-4 times a day prior to getting on meds last week. No melena/hematochezia, no nosebleeds or change in baseline bruisability.    Past Medical History  Diagnosis Date  . Mediastinal lymphadenopathy   . History of prostate cancer remote past    f/u by Dr. Claris Che  . Atrial fibrillation (HCC)     Rate control + Coumadin  . COPD (chronic obstructive pulmonary disease) (HCC)     oxygen-dependent (2.5 L 24/7)  . Adrenal adenoma   . Warthin's tumor   . Dyslipidemia   . Diverticulosis   . Embolic stroke ([place])     d/t endocarditis 2008  . Atrial flutter ([place])   . Ulcerative colitis     left-sided/segmental, associated with diverticulosis.  Sulfasalazine per GI (Dr. Carlean Purl).  Marland Kitchen HTN (hypertension)   . Thrombocytopenia ([place])   . Right thyroid nodule   . Hemorrhoids   . TIA (transient ischemic attack) 03/25/2011    ? of due to transient diplopia  . CAD (coronary artery disease)     Lexiscan Myoview (01/2014): Lateral soft tissue attenuation, no ischemia, not gated, low risk  . CAP (community acquired pneumonia)     Hospitalized 10/2014  . History of mitral valve replacement   . Chronic renal insufficiency, stage III (moderate)     CrCl 50s  . Pulmonary hypertension ([place]) 01/09/2015    secondary  . Prediabetes     Past Surgical History  Procedure Laterality Date  . Mitral valve repair  09/22/04    and modified Cox-Maze IV   . Mitral valve repair  09/14/05    redo MV repair d/t endocardiits/embolic events  . Prostatectomy    . Inguinal hernia  repair      right  . Cataract extraction, bilateral    . Salivary gland surgery      left resection  . Flexible sigmoidoscopy  01/11/2008; 03/19/2010    2009 and 2011:segmental left colitis, diverticulosis, hemorrhoids  . Colonoscopy w/ biopsies  12/19/2007    left-sided colitis, diverticulosis, hemorrhoids  . Carotid duplex dopplers  04/2014    No signif plaques; repeat 2 yrs per cardiology  . Transthoracic echocardiogram  11/2013    Mod LVH, EF 55-60%, no WM abnormalities, +LA dilation, +severe RV dilation and impaired systolic fxn, +increased pulm art pressures.  Valves ok.  . Right heart catheterization N/A 01/01/2015    Procedure: RIGHT HEART CATH;  Surgeon: Peter M Martinique, MD;  Location: [facility] CATH LAB;  Service: Cardiovascular;  Laterality: N/A;    Outpatient Prescriptions Prior to Visit  Medication Sig Dispense Refill  . albuterol (PROVENTIL HFA;VENTOLIN HFA) 108 (90 BASE) MCG/ACT inhaler Inhale 1 puff into the lungs every 6 (six) hours as needed for wheezing or shortness of breath (using 5-6 times every 24 hrs). 3 Inhaler 2  . budesonide (PULMICORT) 0.5 MG/2ML nebulizer solution Take 2 mLs (0.5 mg total) by nebulization 2 (two) times daily. 120 mL 6  . digoxin (LANOXIN) 0.125 MG tablet Take 1 tablet (0.125  mg total) by mouth daily. 90 tablet 3  . diltiazem (CARDIZEM CD) 120 MG 24 hr capsule Take 1 capsule (120 mg total) by mouth daily. 90 capsule 3  . diltiazem (CARDIZEM CD) 240 MG 24 hr capsule Take 1 capsule (240 mg total) by mouth daily. 90 capsule 3  . furosemide (LASIX) 40 MG tablet 1 tab po qd alt with 1/2 tab po qd 180 tablet 4  . levofloxacin (LEVAQUIN) 500 MG tablet Take 1 tablet (500 mg total) by mouth daily. 10 tablet 0  . LORazepam (ATIVAN) 0.5 MG tablet Take 1 tablet (0.5 mg total) by mouth 2 (two) times daily as needed for anxiety. 30 tablet 1  . olopatadine (PATANOL) 0.1 % ophthalmic solution Place 1 drop into both eyes 2 (two) times daily. 5 mL 12  . pravastatin (PRAVACHOL)  40 MG tablet Take 1 tablet (40 mg total) by mouth every evening. 90 tablet 3  . predniSONE (DELTASONE) 20 MG tablet 2 tabs po qd x 5d, then 1 tab po qd x 5d 15 tablet 0  . Tiotropium Bromide-Olodaterol (STIOLTO RESPIMAT) 2.5-2.5 MCG/ACT AERS Inhale 2 puffs into the lungs daily. 3 Inhaler 2  . warfarin (COUMADIN) 5 MG tablet Take 1 tablet by mouth daily or as directed by coumadin clinic 90 tablet 1   No facility-administered medications prior to visit.    No Known Allergies  ROS As per HPI  PE: Blood pressure 131/80, pulse 84, temperature 97.8 F (36.6 C), temperature source Oral, resp. rate 16, height 5' 7.5" (1.715 m), weight 195 lb 4 oz (88.565 kg), SpO2 92 %. oxygen 92% on RA. Gen: Alert, well appearing.  Patient is oriented to person, place, time, and situation. AFFECT: pleasant, lucid thought and speech. CV: irreg irreg, no audible m/r. Chest is clear, no wheezing or rales. Normal symmetric air entry throughout both lung fields. No chest wall deformities or tenderness. EXT: no clubbing, cyanosis, or edema.   LABS:    Chemistry      Component Value Date/Time   NA 138 10/05/2015 1003   K 3.9 10/05/2015 1003   CL 101 10/05/2015 1003   CO2 27 10/05/2015 1003   BUN 16 10/05/2015 1003   CREATININE 1.33 10/05/2015 1003      Component Value Date/Time   CALCIUM 9.3 10/05/2015 1003   ALKPHOS 83 10/05/2015 1003   AST 13 10/05/2015 1003   ALT 11 10/05/2015 1003   BILITOT 0.9 10/05/2015 1003     Lab Results  Component Value Date   INR 2.2 09/23/2015   INR 1.6 09/09/2015   INR 2.6 07/29/2015   PROTIME 18.5 03/10/2009   PROTIME 15.9 02/26/2009   PROTIME 14.2 02/20/2009   IMPRESSION AND PLAN:  COPD exacerbation: improving appropriately on prednisone and levaquin. Finish both as rx'd. Will check PT/INR today to see if abx/steroids have had any effect on this.  An After Visit Summary was printed and given to the patient.  FOLLOW UP: Return in about 4 months (around  02/09/2016) for routine chronic illness f/u.

## 2015-10-12 NOTE — Progress Notes (Signed)
Pre visit review using our clinic review tool, if applicable. No additional management support is needed unless otherwise documented below in the visit note. 

## 2015-10-19 NOTE — Progress Notes (Signed)
HPI: FU MV repair, AFib/flutter, s/p MAZE procedure, nonobstructive CAD, prior embolic CVA after initial MV repair secondary to endocarditis requiring redo surgery (repair), diastolic CHF, COPD, HL, HTN.Echocardiogram March 2016 showed normal LV function, mild aortic insufficiency, moderate left atrial enlargement, mild right atrial and right ventricular enlargement and moderately elevated pulmonary pressures. Holter monitor June 2016 showed atrial fibrillation rate controlled. Right heart catheterization April 2016 showed a right atrial pressure of 8, PA pressure of 59/23 and pulmonary Wedge pressure of 18. Findings consistent with moderate pulmonary hypertension and upper normal left ventricular filling pressures. Since last seen, Patient has dyspnea on exertion unchanged. No orthopnea, PND, pedal edema, chest pain, palpitations, syncope or bleeding.  Studies: - LHC 12/06: Mid RCA 40-50%. Carlton Adam Myoview (01/2014): Lateral soft tissue attenuation, no ischemia, not gated, low risk - Carotid Dopplers August 2015 showed 1-39% bilateral stenosis and follow-up recommended in 2 years.  Current Outpatient Prescriptions  Medication Sig Dispense Refill  . albuterol (PROVENTIL HFA;VENTOLIN HFA) 108 (90 BASE) MCG/ACT inhaler Inhale 1 puff into the lungs every 6 (six) hours as needed for wheezing or shortness of breath (using 5-6 times every 24 hrs). 3 Inhaler 2  . budesonide (PULMICORT) 0.5 MG/2ML nebulizer solution Take 2 mLs (0.5 mg total) by nebulization 2 (two) times daily. 120 mL 6  . digoxin (LANOXIN) 0.125 MG tablet Take 1 tablet (0.125 mg total) by mouth daily. 90 tablet 3  . diltiazem (CARDIZEM CD) 120 MG 24 hr capsule Take 1 capsule (120 mg total) by mouth daily. 90 capsule 3  . diltiazem (CARDIZEM CD) 240 MG 24 hr capsule Take 1 capsule (240 mg total) by mouth daily. 90 capsule 3  . furosemide (LASIX) 40 MG tablet 1 tab po qd alt with 1/2 tab po qd 180 tablet 4  . levofloxacin  (LEVAQUIN) 500 MG tablet Take 1 tablet (500 mg total) by mouth daily. 10 tablet 0  . LORazepam (ATIVAN) 0.5 MG tablet Take 1 tablet (0.5 mg total) by mouth 2 (two) times daily as needed for anxiety. 30 tablet 1  . olopatadine (PATANOL) 0.1 % ophthalmic solution Place 1 drop into both eyes 2 (two) times daily. 5 mL 12  . pravastatin (PRAVACHOL) 40 MG tablet Take 1 tablet (40 mg total) by mouth every evening. 90 tablet 3  . predniSONE (DELTASONE) 20 MG tablet 2 tabs po qd x 5d, then 1 tab po qd x 5d 15 tablet 0  . Tiotropium Bromide-Olodaterol (STIOLTO RESPIMAT) 2.5-2.5 MCG/ACT AERS Inhale 2 puffs into the lungs daily. 3 Inhaler 2  . warfarin (COUMADIN) 5 MG tablet Take 1 tablet by mouth daily or as directed by coumadin clinic 90 tablet 1   No current facility-administered medications for this visit.     Past Medical History  Diagnosis Date  . Mediastinal lymphadenopathy   . History of prostate cancer remote past    f/u by Dr. Claris Che  . Atrial fibrillation (HCC)     Rate control + Coumadin  . COPD (chronic obstructive pulmonary disease) (HCC)     oxygen-dependent (2.5 L 24/7)  . Adrenal adenoma   . Warthin's tumor   . Dyslipidemia   . Diverticulosis   . Embolic stroke ([place])     d/t endocarditis 2008  . Atrial flutter ([place])   . Ulcerative colitis     left-sided/segmental, associated with diverticulosis.  Sulfasalazine per GI (Dr. Carlean Purl).  Marland Kitchen HTN (hypertension)   . Thrombocytopenia ([place])   . Right thyroid nodule   .  Hemorrhoids   . TIA (transient ischemic attack) 03/25/2011    ? of due to transient diplopia  . CAD (coronary artery disease)     Lexiscan Myoview (01/2014): Lateral soft tissue attenuation, no ischemia, not gated, low risk  . CAP (community acquired pneumonia)     Hospitalized 10/2014  . History of mitral valve replacement   . Chronic renal insufficiency, stage III (moderate)     CrCl 50s  . Pulmonary hypertension ([place]) 01/09/2015    secondary  . Prediabetes      Past Surgical History  Procedure Laterality Date  . Mitral valve repair  09/22/04    and modified Cox-Maze IV   . Mitral valve repair  09/14/05    redo MV repair d/t endocardiits/embolic events  . Prostatectomy    . Inguinal hernia repair      right  . Cataract extraction, bilateral    . Salivary gland surgery      left resection  . Flexible sigmoidoscopy  01/11/2008; 03/19/2010    2009 and 2011:segmental left colitis, diverticulosis, hemorrhoids  . Colonoscopy w/ biopsies  12/19/2007    left-sided colitis, diverticulosis, hemorrhoids  . Carotid duplex dopplers  04/2014    No signif plaques; repeat 2 yrs per cardiology  . Transthoracic echocardiogram  11/2013    Mod LVH, EF 55-60%, no WM abnormalities, +LA dilation, +severe RV dilation and impaired systolic fxn, +increased pulm art pressures.  Valves ok.  . Right heart catheterization N/A 01/01/2015    Procedure: RIGHT HEART CATH;  Surgeon: Peter M Martinique, MD;  Location: [facility] CATH LAB;  Service: Cardiovascular;  Laterality: N/A;    Social History   Social History  . Marital Status: Married    Spouse Name: Jeanett Schlein  . Number of Children: 2  . Years of Education: N/A   Occupational History  . RETIRED     [employer]   .     Social History Main Topics  . Smoking status: Former Smoker -- 3.00 packs/day for 60 years    Types: Cigarettes    Quit date: 09/19/2002  . Smokeless tobacco: Never Used  . Alcohol Use: No  . Drug Use: No  . Sexual Activity: Not on file   Other Topics Concern  . Not on file   Social History Narrative   Married, 2 children.   Retired Systems developer from [employer]   180 pack-year tob hx.  No alcohol.       Family History  Problem Relation Age of Onset  . Stroke Father   . Heart disease Father   . Cancer Mother     spinal  . Colon cancer Neg Hx   . Heart attack Neg Hx     ROS: no fevers or chills, productive cough, hemoptysis, dysphasia, odynophagia, melena, hematochezia, dysuria, hematuria,  rash, seizure activity, orthopnea, PND, pedal edema, claudication. Remaining systems are negative.  Physical Exam: Well-developed well-nourished in no acute distress.  Skin is warm and dry.  HEENT is normal.  Neck is supple.  Chest is clear to auscultation with normal expansion.  Cardiovascular exam is irregular Abdominal exam nontender or distended. No masses palpated. Extremities show no edema. neuro grossly intact  ECG Atrial fibrillation with PVCs or aberrantly conducted beats. Right axis deviation.

## 2015-10-21 DIAGNOSIS — C61 Malignant neoplasm of prostate: Secondary | ICD-10-CM | POA: Diagnosis not present

## 2015-10-21 DIAGNOSIS — N5201 Erectile dysfunction due to arterial insufficiency: Secondary | ICD-10-CM | POA: Diagnosis not present

## 2015-10-21 DIAGNOSIS — N393 Stress incontinence (female) (male): Secondary | ICD-10-CM | POA: Diagnosis not present

## 2015-10-21 DIAGNOSIS — Z0000 Encounter for general adult medical examination without abnormal findings: Secondary | ICD-10-CM | POA: Diagnosis not present

## 2015-10-22 ENCOUNTER — Encounter: Payer: Self-pay | Admitting: Cardiology

## 2015-10-22 ENCOUNTER — Ambulatory Visit (INDEPENDENT_AMBULATORY_CARE_PROVIDER_SITE_OTHER): Payer: Medicare Other | Admitting: Cardiology

## 2015-10-22 ENCOUNTER — Ambulatory Visit (INDEPENDENT_AMBULATORY_CARE_PROVIDER_SITE_OTHER): Payer: Medicare Other | Admitting: Pharmacist Clinician (PhC)/ Clinical Pharmacy Specialist

## 2015-10-22 VITALS — BP 132/60 | HR 91 | Ht 68.0 in | Wt 197.7 lb

## 2015-10-22 DIAGNOSIS — I481 Persistent atrial fibrillation: Secondary | ICD-10-CM | POA: Diagnosis not present

## 2015-10-22 DIAGNOSIS — Z5181 Encounter for therapeutic drug level monitoring: Secondary | ICD-10-CM

## 2015-10-22 DIAGNOSIS — I4892 Unspecified atrial flutter: Secondary | ICD-10-CM

## 2015-10-22 DIAGNOSIS — I679 Cerebrovascular disease, unspecified: Secondary | ICD-10-CM | POA: Diagnosis not present

## 2015-10-22 DIAGNOSIS — Z8679 Personal history of other diseases of the circulatory system: Secondary | ICD-10-CM | POA: Diagnosis not present

## 2015-10-22 DIAGNOSIS — Z7901 Long term (current) use of anticoagulants: Secondary | ICD-10-CM

## 2015-10-22 DIAGNOSIS — Z9889 Other specified postprocedural states: Secondary | ICD-10-CM | POA: Diagnosis not present

## 2015-10-22 DIAGNOSIS — I482 Chronic atrial fibrillation, unspecified: Secondary | ICD-10-CM

## 2015-10-22 DIAGNOSIS — I4891 Unspecified atrial fibrillation: Secondary | ICD-10-CM

## 2015-10-22 DIAGNOSIS — I4819 Other persistent atrial fibrillation: Secondary | ICD-10-CM

## 2015-10-22 LAB — POCT INR: INR: 3.5

## 2015-10-22 MED ORDER — DILTIAZEM HCL ER COATED BEADS 120 MG PO CP24: 120.0000 mg | ORAL_CAPSULE | Freq: Every day | ORAL | Status: DC

## 2015-10-22 MED ORDER — DILTIAZEM HCL ER COATED BEADS 240 MG PO CP24: 240.0000 mg | ORAL_CAPSULE | Freq: Every day | ORAL | Status: DC

## 2015-10-22 MED ORDER — PRAVASTATIN SODIUM 40 MG PO TABS: 40.0000 mg | ORAL_TABLET | Freq: Every evening | ORAL | Status: DC

## 2015-10-22 MED ORDER — DIGOXIN 125 MCG PO TABS: 0.1250 mg | ORAL_TABLET | Freq: Every day | ORAL | Status: DC

## 2015-10-22 MED ORDER — WARFARIN SODIUM 5 MG PO TABS: ORAL_TABLET | ORAL | Status: DC

## 2015-10-22 MED ORDER — FUROSEMIDE 40 MG PO TABS: ORAL_TABLET | ORAL | Status: DC

## 2015-10-22 NOTE — Assessment & Plan Note (Signed)
Blood pressure controlled. Continue present medications. 

## 2015-10-22 NOTE — Assessment & Plan Note (Signed)
Continue statin. 

## 2015-10-22 NOTE — Patient Instructions (Signed)
Your physician wants you to follow-up in: [place] will receive a reminder letter in the mail two months in advance. If you don't receive a letter, please call our office to schedule the follow-up appointment.   If you need a refill on your cardiac medications before your next appointment, please call your pharmacy.

## 2015-10-22 NOTE — Assessment & Plan Note (Signed)
Patient remains in atrial fibrillation. Continue Cardizem and digoxin for rate control. Continue Coumadin. 

## 2015-10-22 NOTE — Assessment & Plan Note (Signed)
Follow-up carotid Dopplers August 2017. 

## 2015-10-22 NOTE — Assessment & Plan Note (Signed)
Patient continues to be short of breath. This is multifactorial. His heart rate is now controlled on Cardizem and digoxin. His right heart catheterization recently demonstrated mildly elevated pulmonary capillary wedge pressure And Lasix was increased. Probable component of deconditioning, COPD and obesity hypoventilation syndrome.

## 2015-10-22 NOTE — Assessment & Plan Note (Signed)
Continue SBE prophylaxis for previous mitral valve repair.

## 2015-10-22 NOTE — Assessment & Plan Note (Signed)
Continue statin. No aspirin given the Coumadin. 

## 2015-10-23 ENCOUNTER — Encounter: Payer: Self-pay | Admitting: Family Medicine

## 2015-11-04 DIAGNOSIS — L570 Actinic keratosis: Secondary | ICD-10-CM | POA: Diagnosis not present

## 2015-11-04 DIAGNOSIS — L820 Inflamed seborrheic keratosis: Secondary | ICD-10-CM | POA: Diagnosis not present

## 2015-11-04 DIAGNOSIS — L218 Other seborrheic dermatitis: Secondary | ICD-10-CM | POA: Diagnosis not present

## 2015-11-11 ENCOUNTER — Ambulatory Visit (INDEPENDENT_AMBULATORY_CARE_PROVIDER_SITE_OTHER): Payer: Medicare Other | Admitting: Pharmacist

## 2015-11-11 DIAGNOSIS — Z5181 Encounter for therapeutic drug level monitoring: Secondary | ICD-10-CM

## 2015-11-11 DIAGNOSIS — Z8679 Personal history of other diseases of the circulatory system: Secondary | ICD-10-CM

## 2015-11-11 DIAGNOSIS — I4892 Unspecified atrial flutter: Secondary | ICD-10-CM

## 2015-11-11 DIAGNOSIS — I4821 Permanent atrial fibrillation: Secondary | ICD-10-CM

## 2015-11-11 DIAGNOSIS — I482 Chronic atrial fibrillation: Secondary | ICD-10-CM

## 2015-11-11 DIAGNOSIS — Z7901 Long term (current) use of anticoagulants: Secondary | ICD-10-CM | POA: Diagnosis not present

## 2015-11-11 LAB — POCT INR: INR: 3

## 2015-12-09 ENCOUNTER — Ambulatory Visit (INDEPENDENT_AMBULATORY_CARE_PROVIDER_SITE_OTHER): Payer: Medicare Other | Admitting: *Deleted

## 2015-12-09 DIAGNOSIS — Z8679 Personal history of other diseases of the circulatory system: Secondary | ICD-10-CM

## 2015-12-09 DIAGNOSIS — Z5181 Encounter for therapeutic drug level monitoring: Secondary | ICD-10-CM

## 2015-12-09 DIAGNOSIS — I4892 Unspecified atrial flutter: Secondary | ICD-10-CM

## 2015-12-09 DIAGNOSIS — I4821 Permanent atrial fibrillation: Secondary | ICD-10-CM

## 2015-12-09 DIAGNOSIS — Z7901 Long term (current) use of anticoagulants: Secondary | ICD-10-CM | POA: Diagnosis not present

## 2015-12-09 DIAGNOSIS — I482 Chronic atrial fibrillation: Secondary | ICD-10-CM

## 2015-12-09 LAB — POCT INR: INR: 2.2

## 2016-01-05 ENCOUNTER — Encounter: Payer: Self-pay | Admitting: Emergency Medicine

## 2016-01-05 ENCOUNTER — Ambulatory Visit (INDEPENDENT_AMBULATORY_CARE_PROVIDER_SITE_OTHER): Payer: Medicare Other | Admitting: Emergency Medicine

## 2016-01-05 ENCOUNTER — Ambulatory Visit (INDEPENDENT_AMBULATORY_CARE_PROVIDER_SITE_OTHER): Payer: Medicare Other

## 2016-01-05 VITALS — BP 130/60 | HR 68 | Ht 68.0 in | Wt 200.0 lb

## 2016-01-05 DIAGNOSIS — Z5181 Encounter for therapeutic drug level monitoring: Secondary | ICD-10-CM

## 2016-01-05 DIAGNOSIS — Z7901 Long term (current) use of anticoagulants: Secondary | ICD-10-CM | POA: Diagnosis not present

## 2016-01-05 DIAGNOSIS — Z8679 Personal history of other diseases of the circulatory system: Secondary | ICD-10-CM

## 2016-01-05 DIAGNOSIS — I482 Chronic atrial fibrillation: Secondary | ICD-10-CM

## 2016-01-05 DIAGNOSIS — I4821 Permanent atrial fibrillation: Secondary | ICD-10-CM

## 2016-01-05 DIAGNOSIS — I4892 Unspecified atrial flutter: Secondary | ICD-10-CM

## 2016-01-05 DIAGNOSIS — J438 Other emphysema: Secondary | ICD-10-CM

## 2016-01-05 LAB — POCT INR: INR: 3.1

## 2016-01-05 NOTE — Patient Instructions (Addendum)
Continue your Stiolto 2 puffs daily Continue budesonide nebulizer twice a day  Take albuterol 2 puffs up to every 4 hours if needed for shortness of breath.  We will perform a walking oximetry today to see if you are on enough oxygen Start mucinex 600mg  (guaifenesin) 2 times a day Follow with Dr Lamonte Sakai in 3 months or sooner if you have any problems.

## 2016-01-05 NOTE — Assessment & Plan Note (Signed)
Significant increase in his exertional dyspnea over the last 2-3 weeks. I suspect this relates to his mucus production and allergy exposure. He is having difficulty clearing secretions as well. We will ensure that he is actively shaded with exertion, add Mucinex to help him clear secretions. Continue his bronchodilators as ordered. No evidence of acute exacerbation today.  Please continue your Stiolto 2 puffs daily Continue budesonide nebulizer twice a day  Take albuterol 2 puffs up to every 4 hours if needed for shortness of breath.  We will perform a walking oximetry today to see if you are on enough oxygen Start mucinex 600mg  (guaifenesin) 2 times a day Follow with Dr Lamonte Sakai in 3 months or sooner if you have any problems.

## 2016-01-05 NOTE — Progress Notes (Signed)
Subjective:    Patient ID: [name], male    DOB: 07-11-1936, 80 y.o.   MRN: [id number]  HPI 81 year old man, former smoker (90 pack years), with a history of mitral valve repair, atrial fibrillation (status post Maze procedure), coronary artery disease, history of stroke. He has been followed in our office by Dr Gwenette Greet for COPD and chronic hypoxemic respiratory failure. He has been managed on budesonide nebulized twice a day, Xopenex nebulized 4 times a day on schedule. He feels that these help him. He uses albuterol HFA 3-4x a day. Wakes up at night once to take SABA. He was treated for an AE in January '16. He does not wheeze. He is limited activity, wants to be able to go fishing. He is wearing 2L/min at all time.   ROV 07/17/15 -- follow-up visit for dyspnea in the setting of severe COPD and chronic hypoxemic respiratory failure. He also has a history of atrial fibrillation, coronary artery disease, mitral valve repair. At our last visit we did a trial of replacing scheduled nebulized Xopenex with Stiolto. He believes that his breathing is close to the same, but he does like the fact that he only has to take it once a day. He tells me that he occasionally goes without his O2 with exertion. Uses ventolin about tid.   ROV 01/05/16 -- patient with a history of chronic hypoxemic respiratory failure in the setting of severe COPD, atrial fibrillation, coronary artery disease. He has also had a mitral valve repair in the past. He is currently managed on desonide nebulizer twice a day, Stiolto daily. He uses oxygen at all times at 2 L/m.   He had been stable until about 2-3 weeks ago when he began to experience more exertional SOB. No real increase in cough, clears secretions most of the day. No change in mucous production .  He does have wheeze when he is laying in bed. He is using albuterol HFA frequently through the day, with any exertion. Thick sputum. He will try to exert without O2, then realizes  he misses it.    CAT Score 01/05/2016 10/28/2013  Total CAT Score 19 23    Review of Systems As per HPI     Objective:   Physical Exam Filed Vitals:   01/05/16 0925  BP: 130/60  Pulse: 68  Height: 5\' 8"  (1.727 m)  Weight: 200 lb (90.719 kg)  SpO2: 93%   Gen: Pleasant, elderly gentleman, in no distress,  normal affect on 2 L/m  ENT: No lesions,  mouth clear,  oropharynx clear, no postnasal drip  Neck: No JVD, no stridor  Lungs: No use of accessory muscles, clear without rales or rhonchi, no wheeze on forced expiration  Cardiovascular: distant, irregular, no murmur or gallops, no peripheral edema  Musculoskeletal: No deformities, no cyanosis or clubbing  Neuro: alert, non focal  Skin: Warm, bruising on both deltoids      Assessment & Plan:  COPD (chronic obstructive pulmonary disease) with emphysema Significant increase in his exertional dyspnea over the last 2-3 weeks. I suspect this relates to his mucus production and allergy exposure. He is having difficulty clearing secretions as well. We will ensure that he is actively shaded with exertion, add Mucinex to help him clear secretions. Continue his bronchodilators as ordered. No evidence of acute exacerbation today.  Please continue your Stiolto 2 puffs daily Continue budesonide nebulizer twice a day  Take albuterol 2 puffs up to every 4 hours if needed for  shortness of breath.  We will perform a walking oximetry today to see if you are on enough oxygen Start mucinex 600mg  (guaifenesin) 2 times a day Follow with Dr Lamonte Sakai in 3 months or sooner if you have any problems.

## 2016-02-02 ENCOUNTER — Ambulatory Visit (INDEPENDENT_AMBULATORY_CARE_PROVIDER_SITE_OTHER): Payer: Medicare Other | Admitting: *Deleted

## 2016-02-02 DIAGNOSIS — Z7901 Long term (current) use of anticoagulants: Secondary | ICD-10-CM

## 2016-02-02 DIAGNOSIS — I4892 Unspecified atrial flutter: Secondary | ICD-10-CM | POA: Diagnosis not present

## 2016-02-02 DIAGNOSIS — I4821 Permanent atrial fibrillation: Secondary | ICD-10-CM

## 2016-02-02 DIAGNOSIS — Z5181 Encounter for therapeutic drug level monitoring: Secondary | ICD-10-CM

## 2016-02-02 DIAGNOSIS — I482 Chronic atrial fibrillation: Secondary | ICD-10-CM

## 2016-02-02 DIAGNOSIS — Z8679 Personal history of other diseases of the circulatory system: Secondary | ICD-10-CM

## 2016-02-02 LAB — POCT INR: INR: 2.6

## 2016-02-09 ENCOUNTER — Encounter: Payer: Self-pay | Admitting: Family Medicine

## 2016-02-09 ENCOUNTER — Ambulatory Visit (INDEPENDENT_AMBULATORY_CARE_PROVIDER_SITE_OTHER): Payer: Medicare Other | Admitting: Family Medicine

## 2016-02-09 VITALS — BP 111/70 | HR 72 | Temp 97.3°F | Resp 16 | Ht 68.0 in | Wt 196.2 lb

## 2016-02-09 DIAGNOSIS — J438 Other emphysema: Secondary | ICD-10-CM | POA: Diagnosis not present

## 2016-02-09 DIAGNOSIS — N183 Chronic kidney disease, stage 3 unspecified: Secondary | ICD-10-CM

## 2016-02-09 DIAGNOSIS — R42 Dizziness and giddiness: Secondary | ICD-10-CM | POA: Diagnosis not present

## 2016-02-09 DIAGNOSIS — I10 Essential (primary) hypertension: Secondary | ICD-10-CM | POA: Diagnosis not present

## 2016-02-09 DIAGNOSIS — R7303 Prediabetes: Secondary | ICD-10-CM

## 2016-02-09 LAB — BASIC METABOLIC PANEL
BUN: 13 mg/dL (ref 6–23)
CALCIUM: 9.5 mg/dL (ref 8.4–10.5)
CO2: 29 meq/L (ref 19–32)
CREATININE: 1.35 mg/dL (ref 0.40–1.50)
Chloride: 101 mEq/L (ref 96–112)
GFR: 54.3 mL/min — ABNORMAL LOW (ref >60.00)
Glucose, Bld: 214 mg/dL — ABNORMAL HIGH (ref 70–99)
POTASSIUM: 3.6 meq/L (ref 3.5–5.1)
SODIUM: 136 meq/L (ref 135–145)

## 2016-02-09 NOTE — Patient Instructions (Signed)
Do not take your diltiazem (cardizem CD) 120 mg capsule for the next 5 days. Check your blood pressure and heart rate once a day.

## 2016-02-09 NOTE — Progress Notes (Signed)
Pre visit review using our clinic review tool, if applicable. No additional management support is needed unless otherwise documented below in the visit note. 

## 2016-02-09 NOTE — Progress Notes (Signed)
OFFICE VISIT  02/09/2016   CC:  Chief Complaint  Patient presents with  . Follow-up    Pt is not fasting.    HPI:    Patient is a 80 y.o.  male who presents for 4 mo f/u chronic active medical issues: he has chronic hypoxic respiratory failure in the setting of COPD, CRI stage III, HTN, and a-fib.  Feels like breathing is a baseline, can walk about 15 steps before feeling quite winded.   Compliant with nebs/inhalers.    No home bp monitoring being done. Compliant with statin, no side effects.  A couple of home glucose checks have been done and recalls them being "ok" but can't recall what they were.  Reports recent double vision,about 2 minute duration while driving.  He says he has had this happen before. Looking back in imaging section of EMR he had an MRI 2012 for double vision.  No acute CVA was noted, and MRA showed no correctable large vessel stenosis and just some small/distal vessel irregularities.  Also reports a sensation of dizziness a bit when he goes from sitting to standing position.  Says he has to "chair walk" around his house a lot of the time.  No falls.    Past Medical History  Diagnosis Date  . Mediastinal lymphadenopathy   . History of prostate cancer remote past    f/u by Dr. Claris Che; no recurrence as of f/u 10/2015.  Marland Kitchen Atrial fibrillation (HCC)     Rate control + Coumadin  . COPD (chronic obstructive pulmonary disease) (HCC)     oxygen-dependent (2.5 L 24/7)  . Adrenal adenoma   . Warthin's tumor   . Dyslipidemia   . Diverticulosis   . Embolic stroke ([place])     d/t endocarditis 2008  . Atrial flutter ([place])   . Ulcerative colitis     left-sided/segmental, associated with diverticulosis.  Sulfasalazine per GI (Dr. Carlean Purl).  Marland Kitchen HTN (hypertension)   . Thrombocytopenia ([place])   . Right thyroid nodule   . Hemorrhoids   . TIA (transient ischemic attack) 03/25/2011    ? of due to transient diplopia  . CAD (coronary artery disease)     Lexiscan Myoview  (01/2014): Lateral soft tissue attenuation, no ischemia, not gated, low risk  . CAP (community acquired pneumonia)     Hospitalized 10/2014  . History of mitral valve replacement   . Chronic renal insufficiency, stage III (moderate)     CrCl 50s  . Pulmonary hypertension ([place]) 01/09/2015    secondary  . Prediabetes     Past Surgical History  Procedure Laterality Date  . Mitral valve repair  09/22/04    and modified Cox-Maze IV   . Mitral valve repair  09/14/05    redo MV repair d/t endocardiits/embolic events  . Prostatectomy    . Inguinal hernia repair      right  . Cataract extraction, bilateral    . Salivary gland surgery      left resection  . Flexible sigmoidoscopy  01/11/2008; 03/19/2010    2009 and 2011:segmental left colitis, diverticulosis, hemorrhoids  . Colonoscopy w/ biopsies  12/19/2007    left-sided colitis, diverticulosis, hemorrhoids  . Carotid duplex dopplers  04/2014    No signif plaques; repeat 2 yrs per cardiology  . Transthoracic echocardiogram  11/2013    Mod LVH, EF 55-60%, no WM abnormalities, +LA dilation, +severe RV dilation and impaired systolic fxn, +increased pulm art pressures.  Valves ok.  . Right heart catheterization N/A  01/01/2015    Procedure: RIGHT HEART CATH;  Surgeon: Peter M Martinique, MD;  Location: [facility] CATH LAB;  Service: Cardiovascular;  Laterality: N/A;    Outpatient Prescriptions Prior to Visit  Medication Sig Dispense Refill  . albuterol (PROVENTIL HFA;VENTOLIN HFA) 108 (90 BASE) MCG/ACT inhaler Inhale 1 puff into the lungs every 6 (six) hours as needed for wheezing or shortness of breath (using 5-6 times every 24 hrs). 3 Inhaler 2  . budesonide (PULMICORT) 0.5 MG/2ML nebulizer solution Take 2 mLs (0.5 mg total) by nebulization 2 (two) times daily. 120 mL 6  . digoxin (LANOXIN) 0.125 MG tablet Take 1 tablet (0.125 mg total) by mouth daily. 90 tablet 3  . diltiazem (CARDIZEM CD) 120 MG 24 hr capsule Take 1 capsule (120 mg total) by mouth daily. 90  capsule 3  . diltiazem (CARDIZEM CD) 240 MG 24 hr capsule Take 1 capsule (240 mg total) by mouth daily. 90 capsule 3  . furosemide (LASIX) 40 MG tablet 1 tab po qd alt with 1/2 tab po qd 90 tablet 3  . LORazepam (ATIVAN) 0.5 MG tablet Take 1 tablet (0.5 mg total) by mouth 2 (two) times daily as needed for anxiety. 30 tablet 1  . olopatadine (PATANOL) 0.1 % ophthalmic solution Place 1 drop into both eyes 2 (two) times daily. 5 mL 12  . pravastatin (PRAVACHOL) 40 MG tablet Take 1 tablet (40 mg total) by mouth every evening. 90 tablet 3  . Tiotropium Bromide-Olodaterol (STIOLTO RESPIMAT) 2.5-2.5 MCG/ACT AERS Inhale 2 puffs into the lungs daily. 3 Inhaler 2  . warfarin (COUMADIN) 5 MG tablet Take 1 tablet by mouth daily or as directed by coumadin clinic 90 tablet 1   No facility-administered medications prior to visit.    No Known Allergies  ROS As per HPI  PE: Blood pressure 111/70, pulse 72, temperature 97.3 F (36.3 C), temperature source Oral, resp. rate 16, height 5\' 8"  (1.727 m), weight 196 lb 4 oz (89.018 kg), SpO2 95 %. Gen: Alert, well appearing.  Patient is oriented to person, place, time, and situation. Peripheral vision testing: normal.  Monoc and binoc vision grossly normal. Neck: no carotid bruits.  Carotid pulses 2+ bilat. CV: Irreg irreg, rate about 75-80. LUNGS: CTA bilat, nonlabored   LABS:  Lab Results  Component Value Date   TSH 3.24 12/09/2014   Lab Results  Component Value Date   WBC 7.5 01/01/2015   HGB 13.0 01/01/2015   HCT 39.8 01/01/2015   MCV 89.8 01/01/2015   PLT 158 01/01/2015   Lab Results  Component Value Date   CREATININE 1.33 10/05/2015   BUN 16 10/05/2015   NA 138 10/05/2015   K 3.9 10/05/2015   CL 101 10/05/2015   CO2 27 10/05/2015   Lab Results  Component Value Date   ALT 11 10/05/2015   AST 13 10/05/2015   ALKPHOS 83 10/05/2015   BILITOT 0.9 10/05/2015   Lab Results  Component Value Date   CHOL 147 10/05/2015   Lab Results   Component Value Date   HDL 34.30* 10/05/2015   Lab Results  Component Value Date   LDLCALC 88 10/05/2015   Lab Results  Component Value Date   TRIG 124.0 10/05/2015   Lab Results  Component Value Date   CHOLHDL 4 10/05/2015   Lab Results  Component Value Date   HGBA1C 6.3 10/05/2015   IMPRESSION AND PLAN:  1) COPD, oxygen dependent: stable.  Keep routine pulm f/u.  2) HTN: The  current medical regimen is effective;  continue present plan and medications.  3) Orthostatic dizziness: today he got orthostatic sx's but his bp and HR when going from sitting to standing did not chance significantly. Will see how he feels on a little less diltiazem (in interest of trying to decrease fall risk): pt instructions:Do not take your diltiazem (cardizem CD) 120 mg capsule for the next 5 days. Check your blood pressure and heart rate once a day.  4) CRI stage III: check lytes/cr today.  5) Prediabetes: occ home monitoring is good.  Hopefully he is changing his diet some. We'll see glucose (non-fasting) today again on BMET.  6) A-fib: rate control and anticoagualtion.  Doing well.  We'll see how he does on a little less diltiazem (see #3 above).  An After Visit Summary was printed and given to the patient.  FOLLOW UP: Return in about 1 week (around 02/16/2016) for f/u orthostatic dizziness.  Signed:  Crissie Sickles, MD           02/09/2016

## 2016-02-16 ENCOUNTER — Ambulatory Visit (INDEPENDENT_AMBULATORY_CARE_PROVIDER_SITE_OTHER): Payer: Medicare Other | Admitting: Family Medicine

## 2016-02-16 ENCOUNTER — Encounter: Payer: Self-pay | Admitting: Family Medicine

## 2016-02-16 VITALS — BP 127/69 | HR 70 | Temp 97.3°F | Resp 16 | Ht 68.0 in | Wt 194.8 lb

## 2016-02-16 DIAGNOSIS — R42 Dizziness and giddiness: Secondary | ICD-10-CM

## 2016-02-16 DIAGNOSIS — R7303 Prediabetes: Secondary | ICD-10-CM | POA: Diagnosis not present

## 2016-02-16 DIAGNOSIS — R739 Hyperglycemia, unspecified: Secondary | ICD-10-CM

## 2016-02-16 LAB — POCT GLYCOSYLATED HEMOGLOBIN (HGB A1C): Hemoglobin A1C: 6.1

## 2016-02-16 NOTE — Progress Notes (Signed)
Pre visit review using our clinic review tool, if applicable. No additional management support is needed unless otherwise documented below in the visit note. 

## 2016-02-16 NOTE — Progress Notes (Signed)
OFFICE VISIT  02/16/2016   CC:  Chief Complaint  Patient presents with  . Follow-up    Pt is not fasting.      HPI:    Patient is a 80 y.o. Caucasian male who presents for f/u orthostatic dizziness. Feeling MUCH better. No more dizziness. HR and bp are avg 130s/70, HR 70 avg.  We reviewed his labs from last visit: non-fasting glucose was 214.  Has known hx of prediabetes. HbA1c 6.3 % four years ago, 6.1% 3 yrs ago, and 6.3% again 4 mo ago.  Past Medical History  Diagnosis Date  . Mediastinal lymphadenopathy   . History of prostate cancer remote past    f/u by Dr. Claris Che; no recurrence as of f/u 10/2015.  Marland Kitchen Atrial fibrillation (HCC)     Rate control + Coumadin  . COPD (chronic obstructive pulmonary disease) (HCC)     oxygen-dependent (2.5 L 24/7)  . Adrenal adenoma   . Warthin's tumor   . Dyslipidemia   . Diverticulosis   . Embolic stroke ([place])     d/t endocarditis 2008  . Atrial flutter ([place])   . Ulcerative colitis     left-sided/segmental, associated with diverticulosis.  Sulfasalazine per GI (Dr. Carlean Purl).  Marland Kitchen HTN (hypertension)   . Thrombocytopenia ([place])   . Right thyroid nodule   . Hemorrhoids   . TIA (transient ischemic attack) 03/25/2011    ? of due to transient diplopia  . CAD (coronary artery disease)     Lexiscan Myoview (01/2014): Lateral soft tissue attenuation, no ischemia, not gated, low risk  . CAP (community acquired pneumonia)     Hospitalized 10/2014  . History of mitral valve replacement   . Chronic renal insufficiency, stage III (moderate)     CrCl 50s  . Pulmonary hypertension ([place]) 01/09/2015    secondary  . Prediabetes     Past Surgical History  Procedure Laterality Date  . Mitral valve repair  09/22/04    and modified Cox-Maze IV   . Mitral valve repair  09/14/05    redo MV repair d/t endocardiits/embolic events  . Prostatectomy    . Inguinal hernia repair      right  . Cataract extraction, bilateral    . Salivary gland surgery     left resection  . Flexible sigmoidoscopy  01/11/2008; 03/19/2010    2009 and 2011:segmental left colitis, diverticulosis, hemorrhoids  . Colonoscopy w/ biopsies  12/19/2007    left-sided colitis, diverticulosis, hemorrhoids  . Carotid duplex dopplers  04/2014    No signif plaques; repeat 2 yrs per cardiology  . Transthoracic echocardiogram  11/2013    Mod LVH, EF 55-60%, no WM abnormalities, +LA dilation, +severe RV dilation and impaired systolic fxn, +increased pulm art pressures.  Valves ok.  . Right heart catheterization N/A 01/01/2015    Procedure: RIGHT HEART CATH;  Surgeon: Peter M Martinique, MD;  Location: [facility] CATH LAB;  Service: Cardiovascular;  Laterality: N/A;    Outpatient Prescriptions Prior to Visit  Medication Sig Dispense Refill  . albuterol (PROVENTIL HFA;VENTOLIN HFA) 108 (90 BASE) MCG/ACT inhaler Inhale 1 puff into the lungs every 6 (six) hours as needed for wheezing or shortness of breath (using 5-6 times every 24 hrs). 3 Inhaler 2  . budesonide (PULMICORT) 0.5 MG/2ML nebulizer solution Take 2 mLs (0.5 mg total) by nebulization 2 (two) times daily. 120 mL 6  . digoxin (LANOXIN) 0.125 MG tablet Take 1 tablet (0.125 mg total) by mouth daily. 90 tablet 3  .  diltiazem (CARDIZEM CD) 240 MG 24 hr capsule Take 1 capsule (240 mg total) by mouth daily. 90 capsule 3  . furosemide (LASIX) 40 MG tablet 1 tab po qd alt with 1/2 tab po qd 90 tablet 3  . LORazepam (ATIVAN) 0.5 MG tablet Take 1 tablet (0.5 mg total) by mouth 2 (two) times daily as needed for anxiety. 30 tablet 1  . olopatadine (PATANOL) 0.1 % ophthalmic solution Place 1 drop into both eyes 2 (two) times daily. 5 mL 12  . pravastatin (PRAVACHOL) 40 MG tablet Take 1 tablet (40 mg total) by mouth every evening. 90 tablet 3  . Tiotropium Bromide-Olodaterol (STIOLTO RESPIMAT) 2.5-2.5 MCG/ACT AERS Inhale 2 puffs into the lungs daily. 3 Inhaler 2  . warfarin (COUMADIN) 5 MG tablet Take 1 tablet by mouth daily or as directed by coumadin  clinic 90 tablet 1  . diltiazem (CARDIZEM CD) 120 MG 24 hr capsule Take 1 capsule (120 mg total) by mouth daily. (Patient not taking: Reported on 02/16/2016) 90 capsule 3   No facility-administered medications prior to visit.    No Known Allergies  ROS As per HPI  PE: Blood pressure 127/69, pulse 70, temperature 97.3 F (36.3 C), temperature source Oral, resp. rate 16, height 5\' 8"  (1.727 m), weight 194 lb 12 oz (88.338 kg), SpO2 92 %. Gen: Alert, well appearing.  Patient is oriented to person, place, time, and situation. AFFECT: pleasant, lucid thought and speech.   LABS:  Lab Results  Component Value Date   TSH 3.24 12/09/2014   Lab Results  Component Value Date   WBC 7.5 01/01/2015   HGB 13.0 01/01/2015   HCT 39.8 01/01/2015   MCV 89.8 01/01/2015   PLT 158 01/01/2015   Lab Results  Component Value Date   CREATININE 1.35 02/09/2016   BUN 13 02/09/2016   NA 136 02/09/2016   K 3.6 02/09/2016   CL 101 02/09/2016   CO2 29 02/09/2016   Lab Results  Component Value Date   ALT 11 10/05/2015   AST 13 10/05/2015   ALKPHOS 83 10/05/2015   BILITOT 0.9 10/05/2015   Lab Results  Component Value Date   CHOL 147 10/05/2015   Lab Results  Component Value Date   HDL 34.30* 10/05/2015   Lab Results  Component Value Date   LDLCALC 88 10/05/2015   Lab Results  Component Value Date   TRIG 124.0 10/05/2015   Lab Results  Component Value Date   CHOLHDL 4 10/05/2015   Lab Results  Component Value Date   HGBA1C 6.1 02/16/2016    IMPRESSION AND PLAN:  1) Orthostatic dizziness: much improved/resolved since we lowered his diltiazem daily dose to 240mg . BP and HR have been stable.  2) Prediabetes vs DM 2: his nonfasting glucose was 214 recently, but his A1c was 6.1%. Will check fasting glucose at his earliest opportunity.    An After Visit Summary was printed and given to the patient.  Spent 25 min with pt today, with >50% of this time spent in counseling and  care coordination regarding the above problems.  FOLLOW UP: Return in about 4 months (around 06/18/2016) for routine chronic illness f/u (fasting)--fasting lab visit at pt's convenience.  Signed:  Crissie Sickles, MD           02/16/2016

## 2016-02-19 ENCOUNTER — Other Ambulatory Visit (INDEPENDENT_AMBULATORY_CARE_PROVIDER_SITE_OTHER): Payer: Medicare Other

## 2016-02-19 DIAGNOSIS — R739 Hyperglycemia, unspecified: Secondary | ICD-10-CM | POA: Diagnosis not present

## 2016-02-19 LAB — GLUCOSE, RANDOM: Glucose, Bld: 104 mg/dL — ABNORMAL HIGH (ref 70–99)

## 2016-03-08 ENCOUNTER — Ambulatory Visit (INDEPENDENT_AMBULATORY_CARE_PROVIDER_SITE_OTHER): Payer: Medicare Other

## 2016-03-08 DIAGNOSIS — I4821 Permanent atrial fibrillation: Secondary | ICD-10-CM

## 2016-03-08 DIAGNOSIS — I482 Chronic atrial fibrillation: Secondary | ICD-10-CM

## 2016-03-08 DIAGNOSIS — Z5181 Encounter for therapeutic drug level monitoring: Secondary | ICD-10-CM | POA: Diagnosis not present

## 2016-03-08 DIAGNOSIS — Z7901 Long term (current) use of anticoagulants: Secondary | ICD-10-CM | POA: Diagnosis not present

## 2016-03-08 DIAGNOSIS — I4892 Unspecified atrial flutter: Secondary | ICD-10-CM | POA: Diagnosis not present

## 2016-03-08 DIAGNOSIS — Z8679 Personal history of other diseases of the circulatory system: Secondary | ICD-10-CM | POA: Diagnosis not present

## 2016-03-08 LAB — POCT INR: INR: 2.7

## 2016-03-18 IMAGING — CR DG CHEST 2V
2 series · 2 of 2 positions shown · non-contrast
Comparison: PA and lateral chest x-ray September 26, 2014

CLINICAL DATA: Four days of shortness of breath, occasional
hemoptysis, history of asthma and COPD and coronary artery disease ;
previous heavy smoking history.

EXAM:
CHEST  2 VIEW

[w chest pa]
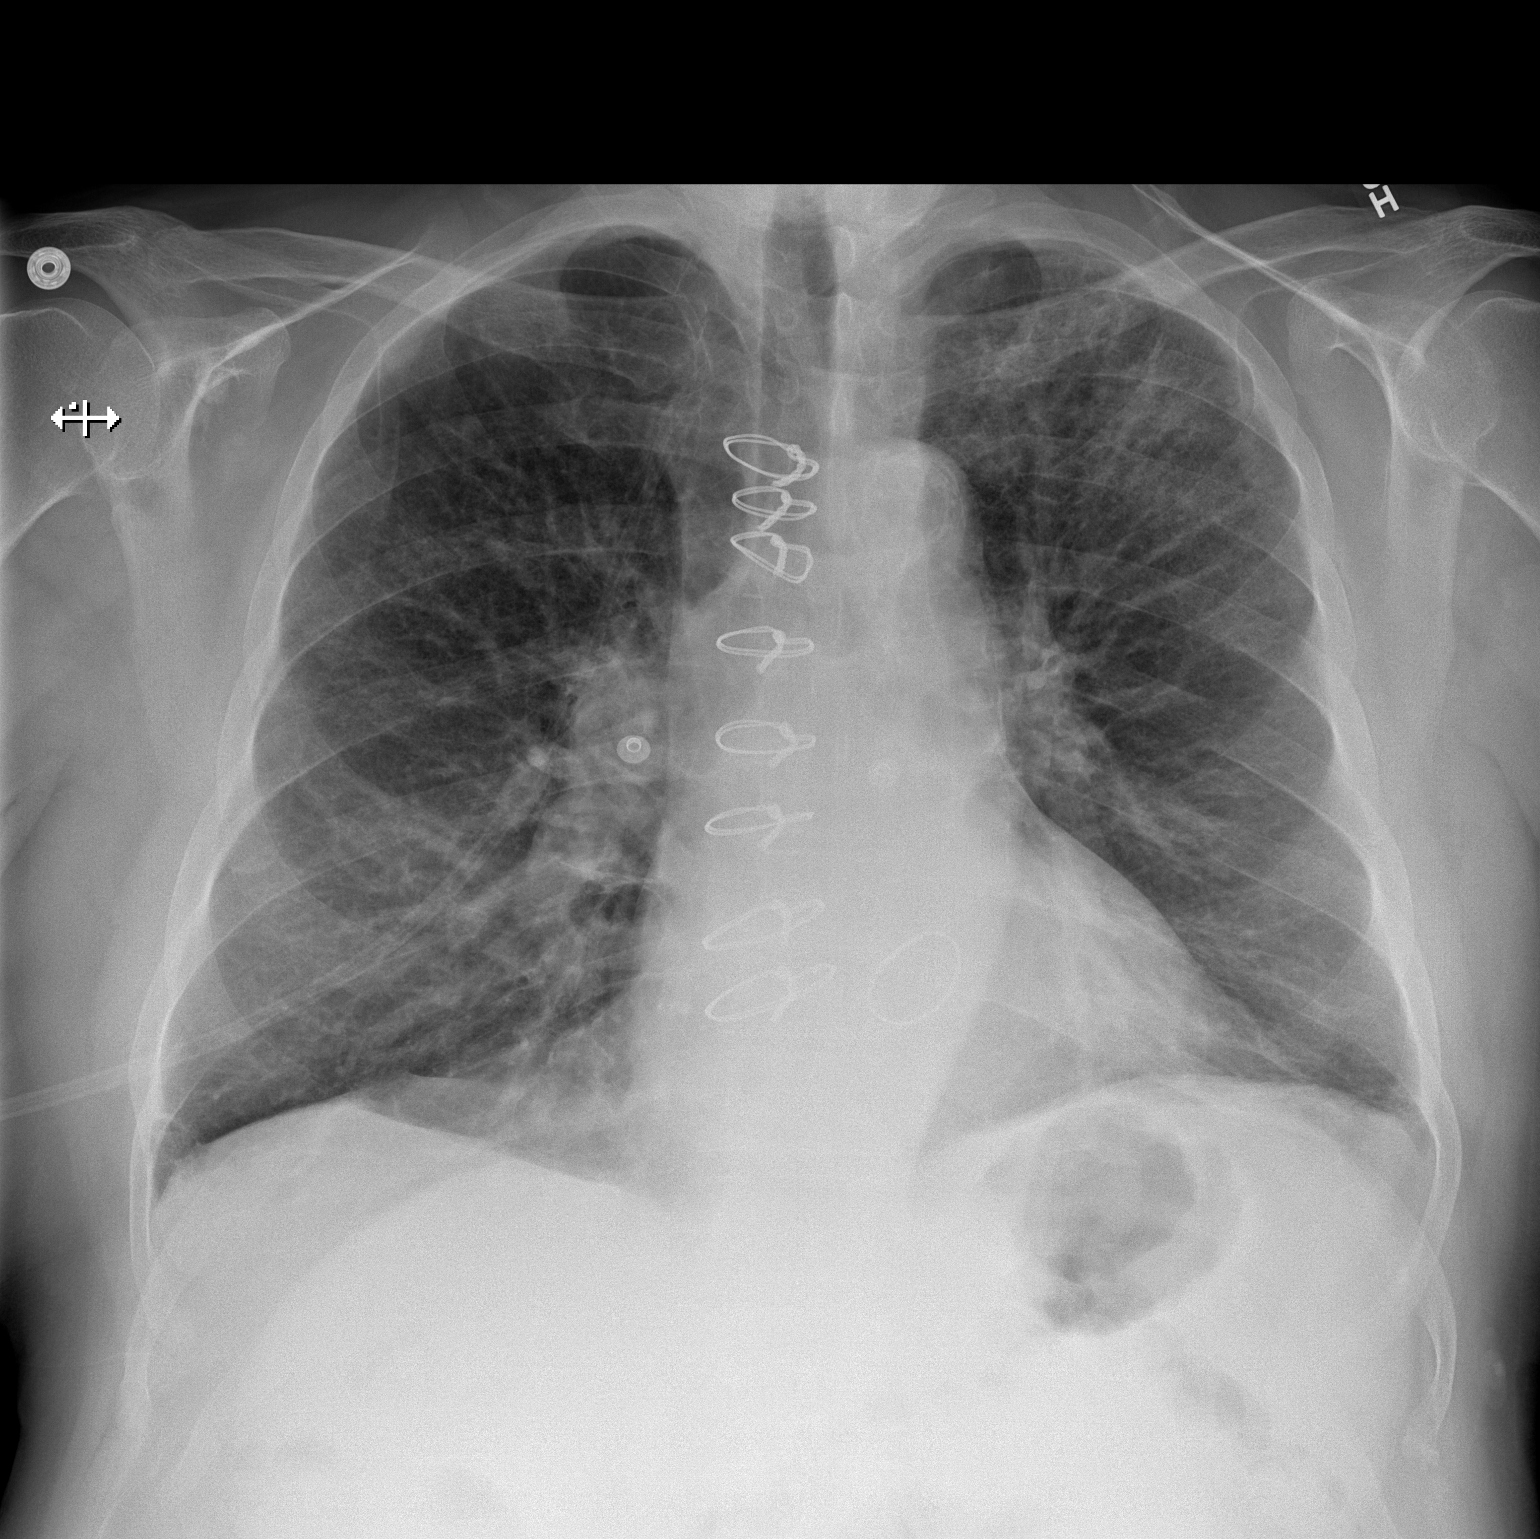

[w chest lat]
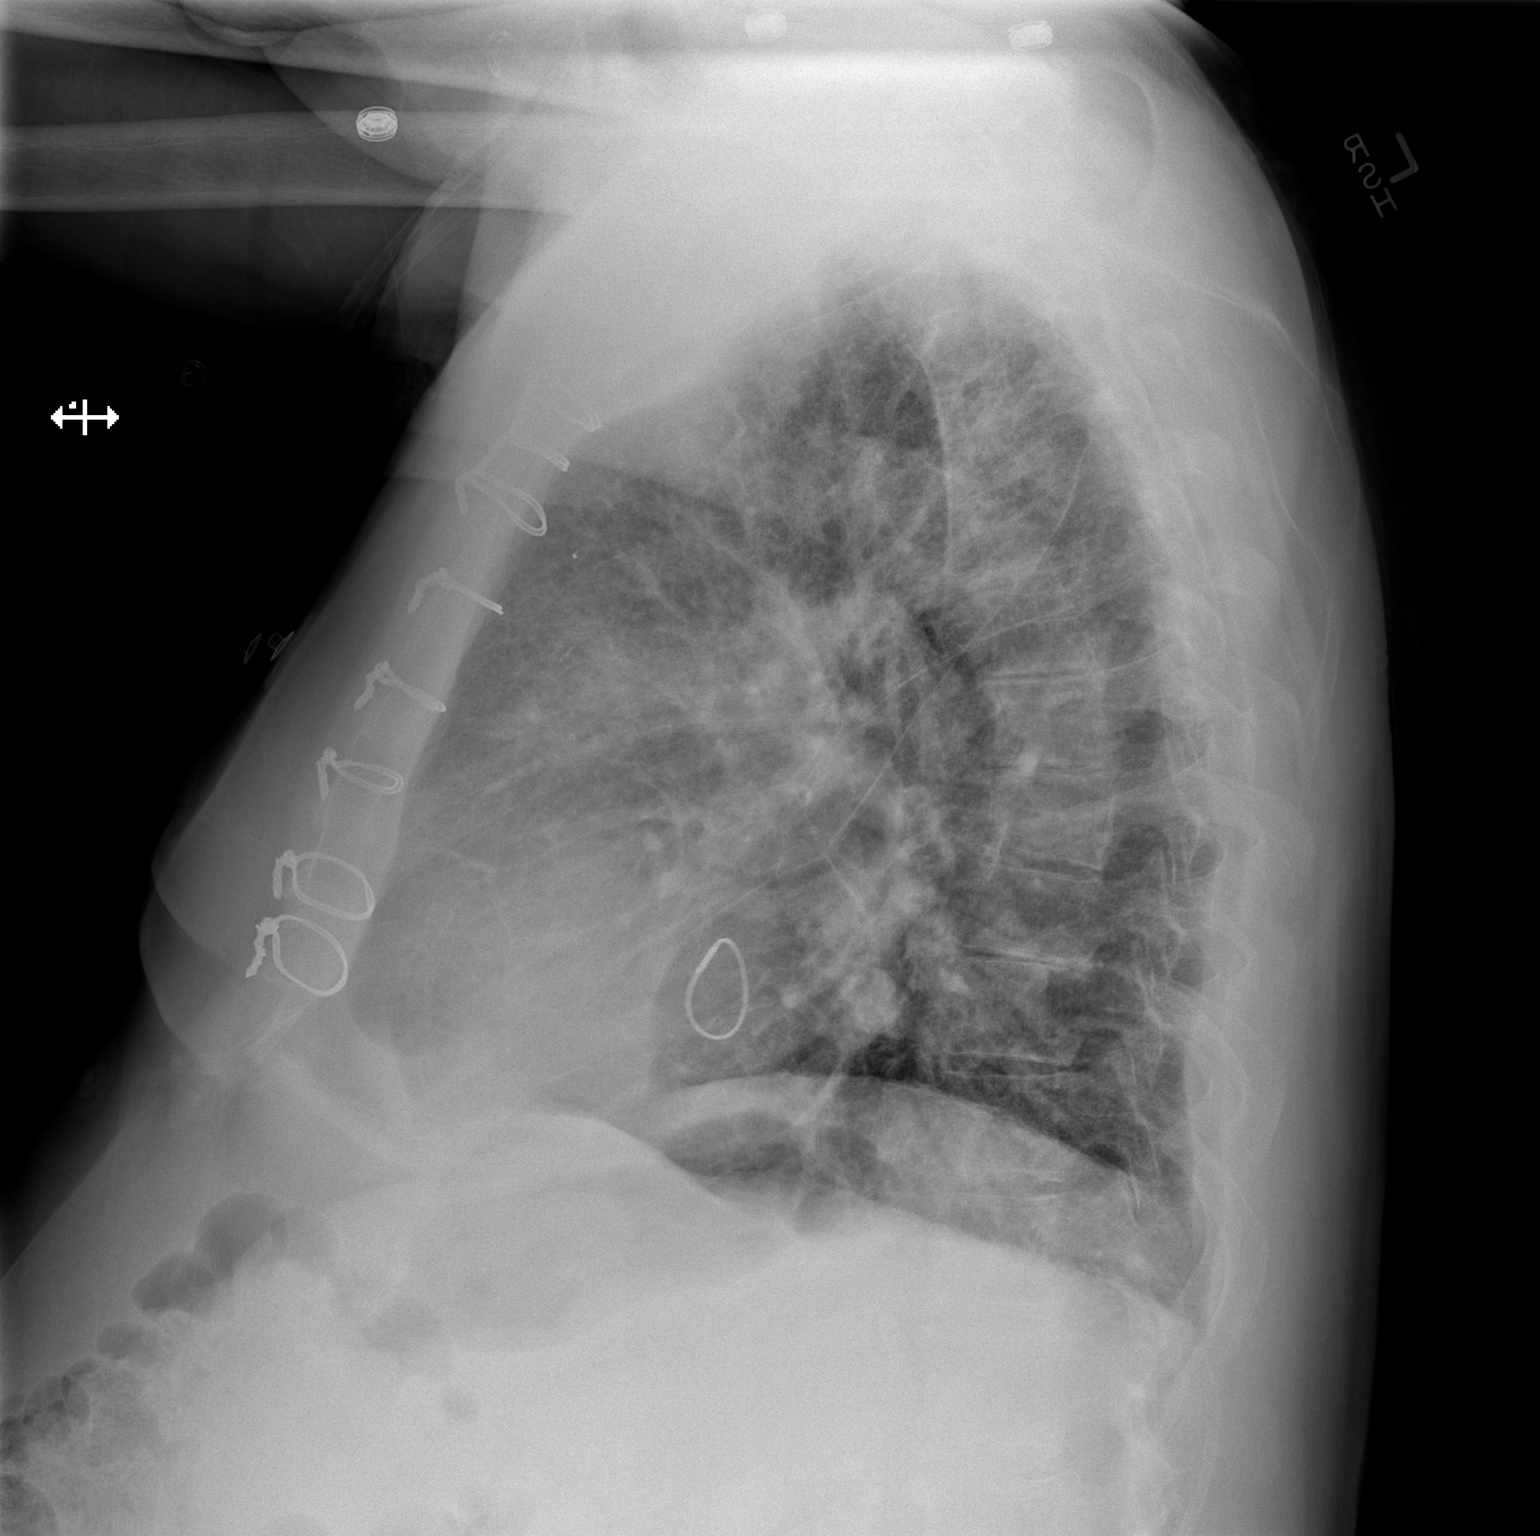

[2 of 2 positions shown; findings below may reference images not displayed]

FINDINGS: The lungs are well-expanded. The interstitial markings are increased
with areas of subtle confluence in the left upper lobe. The
interstitial markings are coarse in both lower lung zones especially
on the right but these are stable. The heart is normal in size. The
pulmonary vascularity is not engorged. A valve ring in the mitral
position is demonstrated. There are 8 intact sternal wires. There is
tortuosity of the descending thoracic aorta. There is no pleural
effusion. There is multilevel degenerative disc disease of the
thoracic spine.
IMPRESSION: COPD. Increased interstitial density predominantly in the upper
lobes greater on the left than on the right consistent with
interstitial pneumonia. No evidence of CHF. Followup radiographs
following anticipated antibiotic therapy are recommended to assure
complete clearing.

## 2016-04-06 DIAGNOSIS — L219 Seborrheic dermatitis, unspecified: Secondary | ICD-10-CM | POA: Diagnosis not present

## 2016-04-06 DIAGNOSIS — L720 Epidermal cyst: Secondary | ICD-10-CM | POA: Diagnosis not present

## 2016-04-06 DIAGNOSIS — D1801 Hemangioma of skin and subcutaneous tissue: Secondary | ICD-10-CM | POA: Diagnosis not present

## 2016-04-06 DIAGNOSIS — L570 Actinic keratosis: Secondary | ICD-10-CM | POA: Diagnosis not present

## 2016-04-06 DIAGNOSIS — D225 Melanocytic nevi of trunk: Secondary | ICD-10-CM | POA: Diagnosis not present

## 2016-04-06 DIAGNOSIS — L821 Other seborrheic keratosis: Secondary | ICD-10-CM | POA: Diagnosis not present

## 2016-04-12 ENCOUNTER — Encounter: Payer: Self-pay | Admitting: Emergency Medicine

## 2016-04-12 ENCOUNTER — Ambulatory Visit (INDEPENDENT_AMBULATORY_CARE_PROVIDER_SITE_OTHER): Payer: Medicare Other | Admitting: Emergency Medicine

## 2016-04-12 ENCOUNTER — Ambulatory Visit: Payer: Medicare Other | Admitting: Emergency Medicine

## 2016-04-12 ENCOUNTER — Ambulatory Visit (INDEPENDENT_AMBULATORY_CARE_PROVIDER_SITE_OTHER): Payer: Medicare Other

## 2016-04-12 DIAGNOSIS — I482 Chronic atrial fibrillation: Secondary | ICD-10-CM

## 2016-04-12 DIAGNOSIS — J438 Other emphysema: Secondary | ICD-10-CM

## 2016-04-12 DIAGNOSIS — Z5181 Encounter for therapeutic drug level monitoring: Secondary | ICD-10-CM

## 2016-04-12 DIAGNOSIS — Z8679 Personal history of other diseases of the circulatory system: Secondary | ICD-10-CM | POA: Diagnosis not present

## 2016-04-12 DIAGNOSIS — I4821 Permanent atrial fibrillation: Secondary | ICD-10-CM

## 2016-04-12 DIAGNOSIS — Z7901 Long term (current) use of anticoagulants: Secondary | ICD-10-CM

## 2016-04-12 DIAGNOSIS — I4892 Unspecified atrial flutter: Secondary | ICD-10-CM

## 2016-04-12 LAB — POCT INR: INR: 3.1

## 2016-04-12 NOTE — Progress Notes (Signed)
Subjective:    Patient ID: [name], male    DOB: April 12, 1936, 80 y.o.   MRN: [id number]  HPI 80 year old man, former smoker (90 pack years), with a history of mitral valve repair, atrial fibrillation (status post Maze procedure), coronary artery disease, history of stroke. He has been followed in our office by Dr Gwenette Greet for COPD and chronic hypoxemic respiratory failure. He has been managed on budesonide nebulized twice a day, Xopenex nebulized 4 times a day on schedule. He feels that these help him. He uses albuterol HFA 3-4x a day. Wakes up at night once to take SABA. He was treated for an AE in January '16. He does not wheeze. He is limited activity, wants to be able to go fishing. He is wearing 2L/min at all time.   ROV 07/17/15 -- follow-up visit for dyspnea in the setting of severe COPD and chronic hypoxemic respiratory failure. He also has a history of atrial fibrillation, coronary artery disease, mitral valve repair. At our last visit we did a trial of replacing scheduled nebulized Xopenex with Stiolto. He believes that his breathing is close to the same, but he does like the fact that he only has to take it once a day. He tells me that he occasionally goes without his O2 with exertion. Uses ventolin about tid.   ROV 01/05/16 -- patient with a history of chronic hypoxemic respiratory failure in the setting of severe COPD, atrial fibrillation, coronary artery disease. He has also had a mitral valve repair in the past. He is currently managed on desonide nebulizer twice a day, Stiolto daily. He uses oxygen at all times at 2 L/m.   He had been stable until about 2-3 weeks ago when he began to experience more exertional SOB. No real increase in cough, clears secretions most of the day. No change in mucous production .  He does have wheeze when he is laying in bed. He is using albuterol HFA frequently through the day, with any exertion. Thick sputum. He will try to exert without O2, then realizes  he misses it.   ROV 04/12/16 -- patient follows up for chronic hypoxemic respiratory failure with a history of severe COPD, atrial fibrillation, mitral valve repair and coronary artery disease. At our last visit we stressed good compliance with his oxygen on exertion, ensured that he is using continuous flow instead of pulse flow. He tells me that he feels about the same - limited exertion, has to stop after about 50 ft. He has not been very reliable with the O2 w exertion. He has an oximeter - has seen SpO2 below 90 on occasion. Using 2L/min continuous when he does wear it. He is using Stiolto qd, he is taking budesonide qam (not BID). He is using SABA freq through the day. He doesn't recall whether he started mucinex last time. He coughs daily, about 4-5x a day.    CAT Score 01/05/2016 10/28/2013  Total CAT Score 19 23    Review of Systems As per HPI     Objective:   Physical Exam Vitals:   04/12/16 0919  BP: 132/70  Pulse: 80  SpO2: 93%  Weight: 192 lb (87.1 kg)  Height: 5\' 8"  (1.727 m)   Gen: Pleasant, elderly gentleman, in no distress,  normal affect on 2 L/m  ENT: No lesions,  mouth clear,  oropharynx clear, no postnasal drip  Neck: No JVD, no stridor  Lungs: No use of accessory muscles, clear without rales or rhonchi,  no wheeze on forced expiration  Cardiovascular: distant, irregular, no murmur or gallops, no peripheral edema  Musculoskeletal: No deformities, no cyanosis or clubbing  Neuro: alert, non focal  Skin: Warm, bruising on both deltoids      Assessment & Plan:  COPD (chronic obstructive pulmonary disease) with emphysema Currently quite limited but remained stable on Stiolto. He is only taking budesonide nebulizer once a day. He continues to only uses oxygen sporadically. We discussed in detail today the times when he would benefit. I like for him to wear with all exertion. I also talked with him about possibly increasing budesonide to twice a day. Consider  possibly adding maintenance prednisone but I would like to avoid any interaction with his other medications especially his warfarin.  Baltazar Apo, MD, PhD 04/12/2016, 9:48 AM [facility] [phone] or if no answer [phone]

## 2016-04-12 NOTE — Patient Instructions (Signed)
Please continue your medications as you are currently taking them You may want to consider increasing your budesonide nebulizer to twice a day  You need to wear your oxygen at 2L/min with all exertion, including walking.  Get the flu shot this Fall Follow with Dr Lamonte Sakai in 4 months or sooner if you have any problems.

## 2016-04-12 NOTE — Assessment & Plan Note (Signed)
Currently quite limited but remained stable on Stiolto. He is only taking budesonide nebulizer once a day. He continues to only uses oxygen sporadically. We discussed in detail today the times when he would benefit. I like for him to wear with all exertion. I also talked with him about possibly increasing budesonide to twice a day. Consider possibly adding maintenance prednisone but I would like to avoid any interaction with his other medications especially his warfarin.

## 2016-04-15 ENCOUNTER — Telehealth: Payer: Self-pay | Admitting: Emergency Medicine

## 2016-04-15 MED ORDER — AZITHROMYCIN 250 MG PO TABS: ORAL_TABLET | ORAL | 0 refills | Status: AC

## 2016-04-15 NOTE — Telephone Encounter (Signed)
Pt is aware of MW's recommendations. Rx has been sent in. Nothing further was needed.

## 2016-04-15 NOTE — Telephone Encounter (Signed)
zpak and  Increase Budesonide as per last instructions to bid > return next week if not improving to see NP

## 2016-04-15 NOTE — Telephone Encounter (Signed)
Spoke with pt, c/o increased SOB, prod cough with yellow mucus X4 days. Denies fever, chest pain, sinus congestion.  Pt states this started after his office visit with RB on Tuesday.  Pt has increased 02 to 3lpm from his prescribed 2lpm because of his SOB.  Pt requesting something be called in.    Pt uses [facility] in [place].    Sending to DOD as RB is off today.   MW please advise.  Thanks!   Instructions   Please continue your medications as you are currently taking them You may want to consider increasing your budesonide nebulizer to twice a day  You need to wear your oxygen at 2L/min with all exertion, including walking.  Get the flu shot this Fall Follow with Dr Lamonte Sakai in 4 months or sooner if you have any problems.

## 2016-04-20 ENCOUNTER — Encounter: Payer: Self-pay | Admitting: Family Medicine

## 2016-05-10 ENCOUNTER — Ambulatory Visit (INDEPENDENT_AMBULATORY_CARE_PROVIDER_SITE_OTHER): Payer: Medicare Other | Admitting: *Deleted

## 2016-05-10 DIAGNOSIS — Z5181 Encounter for therapeutic drug level monitoring: Secondary | ICD-10-CM | POA: Diagnosis not present

## 2016-05-10 DIAGNOSIS — Z8679 Personal history of other diseases of the circulatory system: Secondary | ICD-10-CM | POA: Diagnosis not present

## 2016-05-10 DIAGNOSIS — I4821 Permanent atrial fibrillation: Secondary | ICD-10-CM

## 2016-05-10 DIAGNOSIS — I4892 Unspecified atrial flutter: Secondary | ICD-10-CM

## 2016-05-10 DIAGNOSIS — I482 Chronic atrial fibrillation: Secondary | ICD-10-CM

## 2016-05-10 DIAGNOSIS — Z7901 Long term (current) use of anticoagulants: Secondary | ICD-10-CM | POA: Diagnosis not present

## 2016-05-10 LAB — POCT INR: INR: 2.4

## 2016-05-12 ENCOUNTER — Telehealth: Payer: Self-pay | Admitting: Emergency Medicine

## 2016-05-12 ENCOUNTER — Other Ambulatory Visit: Payer: Self-pay | Admitting: Emergency Medicine

## 2016-05-12 MED ORDER — TIOTROPIUM BROMIDE-OLODATEROL 2.5-2.5 MCG/ACT IN AERS: 2.0000 | INHALATION_SPRAY | Freq: Every day | RESPIRATORY_TRACT | 2 refills | Status: DC

## 2016-05-12 NOTE — Telephone Encounter (Signed)
Called spoke with pt spouse. RX for stiolto has been sent in. Nothing further needed

## 2016-05-31 ENCOUNTER — Encounter: Payer: Self-pay | Admitting: Cardiology

## 2016-06-07 ENCOUNTER — Encounter (INDEPENDENT_AMBULATORY_CARE_PROVIDER_SITE_OTHER): Payer: Self-pay

## 2016-06-07 ENCOUNTER — Ambulatory Visit (INDEPENDENT_AMBULATORY_CARE_PROVIDER_SITE_OTHER): Payer: Medicare Other | Admitting: *Deleted

## 2016-06-07 DIAGNOSIS — I482 Chronic atrial fibrillation: Secondary | ICD-10-CM

## 2016-06-07 DIAGNOSIS — I4892 Unspecified atrial flutter: Secondary | ICD-10-CM | POA: Diagnosis not present

## 2016-06-07 DIAGNOSIS — Z7901 Long term (current) use of anticoagulants: Secondary | ICD-10-CM

## 2016-06-07 DIAGNOSIS — Z8679 Personal history of other diseases of the circulatory system: Secondary | ICD-10-CM

## 2016-06-07 DIAGNOSIS — I4821 Permanent atrial fibrillation: Secondary | ICD-10-CM

## 2016-06-07 DIAGNOSIS — Z5181 Encounter for therapeutic drug level monitoring: Secondary | ICD-10-CM | POA: Diagnosis not present

## 2016-06-07 LAB — POCT INR: INR: 3.2

## 2016-06-11 NOTE — Progress Notes (Signed)
HPI: FU MV repair, AFib/flutter, s/p MAZE procedure, nonobstructive CAD, prior embolic CVA after initial MV repair secondary to endocarditis requiring redo surgery (repair), diastolic CHF, COPD, HL, HTN.Echocardiogram March 2016 showed normal LV function, mild aortic insufficiency, moderate left atrial enlargement, mild right atrial and right ventricular enlargement and moderately elevated pulmonary pressures. Holter monitor June 2016 showed atrial fibrillation rate controlled. Right heart catheterization April 2016 showed a right atrial pressure of 8, PA pressure of 59/23 and pulmonary Wedge pressure of 18. Findings consistent with moderate pulmonary hypertension and upper normal left ventricular filling pressures. Since last seen, he continues to have dyspnea on exertion. No orthopnea, PND, pedal edema, chest pain or syncope.  Studies: - LHC 12/06: Mid RCA 40-50%. Carlton Adam Myoview (01/2014): Lateral soft tissue attenuation, no ischemia, not gated, low risk - Carotid Dopplers August 2015 showed 1-39% bilateral stenosis and follow-up recommended in 2 years.  Current Outpatient Prescriptions  Medication Sig Dispense Refill  . albuterol (PROVENTIL HFA;VENTOLIN HFA) 108 (90 BASE) MCG/ACT inhaler Inhale 1 puff into the lungs every 6 (six) hours as needed for wheezing or shortness of breath (using 5-6 times every 24 hrs). 3 Inhaler 2  . budesonide (PULMICORT) 0.5 MG/2ML nebulizer solution Take 2 mLs (0.5 mg total) by nebulization 2 (two) times daily. 120 mL 6  . digoxin (LANOXIN) 0.125 MG tablet Take 1 tablet (0.125 mg total) by mouth daily. 90 tablet 3  . diltiazem (CARDIZEM CD) 240 MG 24 hr capsule Take 1 capsule (240 mg total) by mouth daily. 90 capsule 3  . furosemide (LASIX) 40 MG tablet 1 tab po qd alt with 1/2 tab po qd 90 tablet 3  . LORazepam (ATIVAN) 0.5 MG tablet Take 1 tablet (0.5 mg total) by mouth 2 (two) times daily as needed for anxiety. 30 tablet 1  . olopatadine (PATANOL)  0.1 % ophthalmic solution Place 1 drop into both eyes 2 (two) times daily. 5 mL 12  . pravastatin (PRAVACHOL) 40 MG tablet Take 1 tablet (40 mg total) by mouth every evening. 90 tablet 3  . Tiotropium Bromide-Olodaterol (STIOLTO RESPIMAT) 2.5-2.5 MCG/ACT AERS Inhale 2 puffs into the lungs daily. 3 Inhaler 2  . warfarin (COUMADIN) 5 MG tablet Take 1 tablet by mouth daily or as directed by coumadin clinic 90 tablet 1   No current facility-administered medications for this visit.      Past Medical History:  Diagnosis Date  . Adrenal adenoma   . Atrial fibrillation (HCC)    Rate control + Coumadin  . Atrial flutter ([place])   . CAD (coronary artery disease)    Lexiscan Myoview (01/2014): Lateral soft tissue attenuation, no ischemia, not gated, low risk  . CAP (community acquired pneumonia)    Hospitalized 10/2014  . Chronic hypoxemic respiratory failure (HCC)    from COPD  . Chronic renal insufficiency, stage III (moderate)    CrCl 50s  . COPD (chronic obstructive pulmonary disease) (HCC)    oxygen-dependent (2.5 L 24/7)  . Diabetes mellitus without complication ([place]) 123456   A1c 6.5 % 05/2016  . Diverticulosis   . Dyslipidemia   . Embolic stroke ([place])    d/t endocarditis 2008  . Hemorrhoids   . History of mitral valve replacement   . History of prostate cancer remote past   f/u by Dr. Claris Che; no recurrence as of f/u 10/2015.  Marland Kitchen HTN (hypertension)   . Mediastinal lymphadenopathy   . Pulmonary hypertension ([place]) 01/09/2015   secondary  .  Right thyroid nodule   . Thrombocytopenia ([place])   . TIA (transient ischemic attack) 03/25/2011   ? of due to transient diplopia  . Ulcerative colitis    left-sided/segmental, associated with diverticulosis.  Sulfasalazine per GI (Dr. Carlean Purl).  . Warthin's tumor     Past Surgical History:  Procedure Laterality Date  . carotid duplex dopplers  04/2014   No signif plaques; repeat 2 yrs per cardiology  . CATARACT EXTRACTION, BILATERAL    .  COLONOSCOPY W/ BIOPSIES  12/19/2007   left-sided colitis, diverticulosis, hemorrhoids  . FLEXIBLE SIGMOIDOSCOPY  01/11/2008; 03/19/2010   2009 and 2011:segmental left colitis, diverticulosis, hemorrhoids  . INGUINAL HERNIA REPAIR     right  . MITRAL VALVE REPAIR  09/22/04   and modified Cox-Maze IV   . MITRAL VALVE REPAIR  09/14/05   redo MV repair d/t endocardiits/embolic events  . PROSTATECTOMY    . RIGHT HEART CATHETERIZATION N/A 01/01/2015   Procedure: RIGHT HEART CATH;  Surgeon: Peter M Martinique, MD;  Location: [facility] CATH LAB;  Service: Cardiovascular;  Laterality: N/A;  . SALIVARY GLAND SURGERY     left resection  . TRANSTHORACIC ECHOCARDIOGRAM  11/2013   Mod LVH, EF 55-60%, no WM abnormalities, +LA dilation, +severe RV dilation and impaired systolic fxn, +increased pulm art pressures.  Valves ok.    Social History   Social History  . Marital status: Married    Spouse name: Jeanett Schlein  . Number of children: 2  . Years of education: N/A   Occupational History  . RETIRED     [employer]   .  Retired   Social History Main Topics  . Smoking status: Former Smoker    Packs/day: 3.00    Years: 60.00    Types: Cigarettes    Quit date: 09/19/2002  . Smokeless tobacco: Never Used  . Alcohol use No  . Drug use: No  . Sexual activity: Not on file   Other Topics Concern  . Not on file   Social History Narrative   Married, 2 children.   Retired Systems developer from [employer]   180 pack-year tob hx.  No alcohol.       Family History  Problem Relation Age of Onset  . Cancer Mother     spinal  . Stroke Father   . Heart disease Father   . Colon cancer Neg Hx   . Heart attack Neg Hx     ROS: no fevers or chills, productive cough, hemoptysis, dysphasia, odynophagia, melena, hematochezia, dysuria, hematuria, rash, seizure activity, orthopnea, PND, pedal edema, claudication. Remaining systems are negative.  Physical Exam: Well-developed obese in no acute distress.  Skin is warm and dry.    HEENT is normal.  Neck is supple.  Chest is clear to auscultation with normal expansion.  Cardiovascular exam is regular rate and rhythm.  Abdominal exam nontender or distended. No masses palpated. Extremities show no edema. neuro grossly intact  ECG Atrial flutter, right superior axis, nonspecific ST changes.  A/P  1 Atrial fibrillation-continue Cardizem and digitoxin for rate control. Continue Coumadin.  2 carotid artery disease-scheduled follow-up carotid Dopplers.  3 coronary artery disease-continue statin. No aspirin given need for Coumadin.  4 hypertension-blood pressure controlled. Continue present medications.  5 hyperlipidemia-continue statin.  6 history of mitral valve repair-continue SBE prophylaxis.  7 dyspnea-patient continues to have dyspnea. This is felt to be multifactorial including component of deconditioning, COPD and obesity hypoventilation syndrome.  Kirk Ruths, MD

## 2016-06-13 ENCOUNTER — Ambulatory Visit (INDEPENDENT_AMBULATORY_CARE_PROVIDER_SITE_OTHER): Payer: Medicare Other | Admitting: Family Medicine

## 2016-06-13 ENCOUNTER — Encounter: Payer: Self-pay | Admitting: Family Medicine

## 2016-06-13 VITALS — BP 154/80 | HR 67 | Temp 97.3°F | Resp 20 | Wt 200.8 lb

## 2016-06-13 DIAGNOSIS — J9611 Chronic respiratory failure with hypoxia: Secondary | ICD-10-CM | POA: Diagnosis not present

## 2016-06-13 DIAGNOSIS — E785 Hyperlipidemia, unspecified: Secondary | ICD-10-CM

## 2016-06-13 DIAGNOSIS — J438 Other emphysema: Secondary | ICD-10-CM | POA: Diagnosis not present

## 2016-06-13 DIAGNOSIS — N183 Chronic kidney disease, stage 3 unspecified: Secondary | ICD-10-CM

## 2016-06-13 DIAGNOSIS — Z23 Encounter for immunization: Secondary | ICD-10-CM

## 2016-06-13 DIAGNOSIS — R7303 Prediabetes: Secondary | ICD-10-CM

## 2016-06-13 LAB — HEMOGLOBIN A1C: Hgb A1c MFr Bld: 6.5 % (ref 4.6–6.5)

## 2016-06-13 LAB — BASIC METABOLIC PANEL
BUN: 14 mg/dL (ref 6–23)
CALCIUM: 9.5 mg/dL (ref 8.4–10.5)
CO2: 30 meq/L (ref 19–32)
CREATININE: 1.35 mg/dL (ref 0.40–1.50)
Chloride: 102 mEq/L (ref 96–112)
GFR: 54.26 mL/min — ABNORMAL LOW (ref >60.00)
Glucose, Bld: 113 mg/dL — ABNORMAL HIGH (ref 70–99)
Potassium: 4.3 mEq/L (ref 3.5–5.1)
Sodium: 138 mEq/L (ref 135–145)

## 2016-06-13 NOTE — Progress Notes (Signed)
OFFICE VISIT  06/13/2016   CC:  Chief Complaint  Patient presents with  . Follow-up    COPD     HPI:    Patient is a 80 y.o. Caucasian male who presents for 4 mo f/u COPD with chronic hypoxemic resp failure, CRI stage III, prediabetes. Has gradually been gaining wt b/c he says he is eating more than normal. Due for f/u fasting glucose today. He hasn't taken his lasix the last 2 days--was doing something outside home and didn't want to have to pee all the time.  He is at baseline resp sx's: coughs up yellow phlegm in morning.  Feels SOB with simply walking.  Oxygen consistently in 90s on 2L oxygen. Home bp monitoring: normal most of the time but sometimes systolic up to [id number].  Regarding his dx of prediabetes,he has not made any changes in his diet. He is compliant with statin daily, no side effects.  He has routine cardiology f/u tomorrow.  Past Medical History:  Diagnosis Date  . Adrenal adenoma   . Atrial fibrillation (HCC)    Rate control + Coumadin  . Atrial flutter ([place])   . CAD (coronary artery disease)    Lexiscan Myoview (01/2014): Lateral soft tissue attenuation, no ischemia, not gated, low risk  . CAP (community acquired pneumonia)    Hospitalized 10/2014  . Chronic hypoxemic respiratory failure (HCC)    from COPD  . Chronic renal insufficiency, stage III (moderate)    CrCl 50s  . COPD (chronic obstructive pulmonary disease) (HCC)    oxygen-dependent (2.5 L 24/7)  . Diverticulosis   . Dyslipidemia   . Embolic stroke ([place])    d/t endocarditis 2008  . Hemorrhoids   . History of mitral valve replacement   . History of prostate cancer remote past   f/u by Dr. Claris Che; no recurrence as of f/u 10/2015.  Marland Kitchen HTN (hypertension)   . Mediastinal lymphadenopathy   . Prediabetes   . Pulmonary hypertension ([place]) 01/09/2015   secondary  . Right thyroid nodule   . Thrombocytopenia ([place])   . TIA (transient ischemic attack) 03/25/2011   ? of due to transient diplopia  .  Ulcerative colitis    left-sided/segmental, associated with diverticulosis.  Sulfasalazine per GI (Dr. Carlean Purl).  . Warthin's tumor     Past Surgical History:  Procedure Laterality Date  . carotid duplex dopplers  04/2014   No signif plaques; repeat 2 yrs per cardiology  . CATARACT EXTRACTION, BILATERAL    . COLONOSCOPY W/ BIOPSIES  12/19/2007   left-sided colitis, diverticulosis, hemorrhoids  . FLEXIBLE SIGMOIDOSCOPY  01/11/2008; 03/19/2010   2009 and 2011:segmental left colitis, diverticulosis, hemorrhoids  . INGUINAL HERNIA REPAIR     right  . MITRAL VALVE REPAIR  09/22/04   and modified Cox-Maze IV   . MITRAL VALVE REPAIR  09/14/05   redo MV repair d/t endocardiits/embolic events  . PROSTATECTOMY    . RIGHT HEART CATHETERIZATION N/A 01/01/2015   Procedure: RIGHT HEART CATH;  Surgeon: Peter M Martinique, MD;  Location: [facility] CATH LAB;  Service: Cardiovascular;  Laterality: N/A;  . SALIVARY GLAND SURGERY     left resection  . TRANSTHORACIC ECHOCARDIOGRAM  11/2013   Mod LVH, EF 55-60%, no WM abnormalities, +LA dilation, +severe RV dilation and impaired systolic fxn, +increased pulm art pressures.  Valves ok.    Outpatient Medications Prior to Visit  Medication Sig Dispense Refill  . albuterol (PROVENTIL HFA;VENTOLIN HFA) 108 (90 BASE) MCG/ACT inhaler Inhale 1 puff into the  lungs every 6 (six) hours as needed for wheezing or shortness of breath (using 5-6 times every 24 hrs). 3 Inhaler 2  . budesonide (PULMICORT) 0.5 MG/2ML nebulizer solution Take 2 mLs (0.5 mg total) by nebulization 2 (two) times daily. 120 mL 6  . digoxin (LANOXIN) 0.125 MG tablet Take 1 tablet (0.125 mg total) by mouth daily. 90 tablet 3  . diltiazem (CARDIZEM CD) 240 MG 24 hr capsule Take 1 capsule (240 mg total) by mouth daily. 90 capsule 3  . furosemide (LASIX) 40 MG tablet 1 tab po qd alt with 1/2 tab po qd 90 tablet 3  . LORazepam (ATIVAN) 0.5 MG tablet Take 1 tablet (0.5 mg total) by mouth 2 (two) times daily as needed  for anxiety. 30 tablet 1  . olopatadine (PATANOL) 0.1 % ophthalmic solution Place 1 drop into both eyes 2 (two) times daily. 5 mL 12  . pravastatin (PRAVACHOL) 40 MG tablet Take 1 tablet (40 mg total) by mouth every evening. 90 tablet 3  . warfarin (COUMADIN) 5 MG tablet Take 1 tablet by mouth daily or as directed by coumadin clinic 90 tablet 1  . Tiotropium Bromide-Olodaterol (STIOLTO RESPIMAT) 2.5-2.5 MCG/ACT AERS Inhale 2 puffs into the lungs daily. (Patient not taking: Reported on 06/13/2016) 3 Inhaler 2   No facility-administered medications prior to visit.     No Known Allergies  ROS As per HPI  PE: Blood pressure (!) 154/80, pulse 67, temperature 97.3 F (36.3 C), temperature source Oral, resp. rate 20, weight 200 lb 12.8 oz (91.1 kg), SpO2 96 %. on 2L oxygen.  Repeat bp 132/76 today. Gen: Alert, well appearing.  Patient is oriented to person, place, time, and situation. [id number]: no injection, icteris, swelling, or exudate.  EOMI, PERRLA. Mouth: lips without lesion/swelling.  Oral mucosa pink and moist. Oropharynx without erythema, exudate, or swelling.  CV: RRR, no m/r/g.   LUNGS: CTA bilat, nonlabored resps, mildly diminished aeration in all lung fields. EXT: no clubbing, cyanosis, or edema.    LABS:  Lab Results  Component Value Date   TSH 3.24 12/09/2014   Lab Results  Component Value Date   WBC 7.5 01/01/2015   HGB 13.0 01/01/2015   HCT 39.8 01/01/2015   MCV 89.8 01/01/2015   PLT 158 01/01/2015   Lab Results  Component Value Date   CREATININE 1.35 02/09/2016   BUN 13 02/09/2016   NA 136 02/09/2016   K 3.6 02/09/2016   CL 101 02/09/2016   CO2 29 02/09/2016   Lab Results  Component Value Date   ALT 11 10/05/2015   AST 13 10/05/2015   ALKPHOS 83 10/05/2015   BILITOT 0.9 10/05/2015   Lab Results  Component Value Date   CHOL 147 10/05/2015   Lab Results  Component Value Date   HDL 34.30 (L) 10/05/2015   Lab Results  Component Value Date   LDLCALC  88 10/05/2015   Lab Results  Component Value Date   TRIG 124.0 10/05/2015   Lab Results  Component Value Date   CHOLHDL 4 10/05/2015   Lab Results  Component Value Date   HGBA1C 6.1 02/16/2016    IMPRESSION AND PLAN:  1) COPD w/chronic hypoxic resp failure: continue current inhalers and oxygen supplementation. He is at his baseline. Flu vaccine today.  2) A-fib, hx of MV replacement, HTN: rhythm regular today. BP controlled.  No sign of fluid overload.  I think most of his weight gain lately has been gradual wt gain due to  calories in > calories out.  3) Prediabetes: recheck fasting glucose and A1c today.  4) Hyperlipidemia: tolerating statin, lipids good 09/2015, as were AST/ALT. Plan repeat FLP and AST/ALT on/after 09/2016.  An After Visit Summary was printed and given to the patient.  FOLLOW UP: Return in about 4 months (around 10/13/2016) for routine chronic illness f/u.  Signed:  Crissie Sickles, MD           06/13/2016

## 2016-06-13 NOTE — Addendum Note (Signed)
Addended by: Gordy Councilman on: 06/13/2016 09:55 AM   Modules accepted: Orders

## 2016-06-13 NOTE — Progress Notes (Signed)
Pre visit review using our clinic review tool, if applicable. No additional management support is needed unless otherwise documented below in the visit note. 

## 2016-06-14 ENCOUNTER — Encounter: Payer: Self-pay | Admitting: Family Medicine

## 2016-06-14 ENCOUNTER — Ambulatory Visit (INDEPENDENT_AMBULATORY_CARE_PROVIDER_SITE_OTHER): Payer: Medicare Other | Admitting: Cardiology

## 2016-06-14 VITALS — BP 118/54 | HR 70 | Ht 68.0 in | Wt 198.0 lb

## 2016-06-14 DIAGNOSIS — I2510 Atherosclerotic heart disease of native coronary artery without angina pectoris: Secondary | ICD-10-CM | POA: Diagnosis not present

## 2016-06-14 DIAGNOSIS — I4891 Unspecified atrial fibrillation: Secondary | ICD-10-CM | POA: Diagnosis not present

## 2016-06-14 DIAGNOSIS — E119 Type 2 diabetes mellitus without complications: Secondary | ICD-10-CM

## 2016-06-14 DIAGNOSIS — I679 Cerebrovascular disease, unspecified: Secondary | ICD-10-CM

## 2016-06-14 HISTORY — DX: Type 2 diabetes mellitus without complications: E11.9

## 2016-06-14 NOTE — Patient Instructions (Signed)
Medication Instructions:   NO CHANGE  Testing/Procedures:  Your physician has requested that you have a carotid duplex. This test is an ultrasound of the carotid arteries in your neck. It looks at blood flow through these arteries that supply the brain with blood. Allow one hour for this exam. There are no restrictions or special instructions.    Follow-Up:  Your physician wants you to follow-up in: 6 MONTHS WITH DR CRENSHAW You will receive a reminder letter in the mail two months in advance. If you don't receive a letter, please call our office to schedule the follow-up appointment.   If you need a refill on your cardiac medications before your next appointment, please call your pharmacy.    

## 2016-06-15 ENCOUNTER — Other Ambulatory Visit: Payer: Self-pay | Admitting: Family Medicine

## 2016-06-15 DIAGNOSIS — E119 Type 2 diabetes mellitus without complications: Secondary | ICD-10-CM

## 2016-06-20 ENCOUNTER — Encounter: Payer: Self-pay | Admitting: Family Medicine

## 2016-06-21 ENCOUNTER — Ambulatory Visit (INDEPENDENT_AMBULATORY_CARE_PROVIDER_SITE_OTHER): Payer: Medicare Other | Admitting: *Deleted

## 2016-06-21 DIAGNOSIS — Z7901 Long term (current) use of anticoagulants: Secondary | ICD-10-CM

## 2016-06-21 DIAGNOSIS — Z5181 Encounter for therapeutic drug level monitoring: Secondary | ICD-10-CM | POA: Diagnosis not present

## 2016-06-21 DIAGNOSIS — I4821 Permanent atrial fibrillation: Secondary | ICD-10-CM

## 2016-06-21 DIAGNOSIS — I4892 Unspecified atrial flutter: Secondary | ICD-10-CM

## 2016-06-21 DIAGNOSIS — Z8679 Personal history of other diseases of the circulatory system: Secondary | ICD-10-CM

## 2016-06-21 DIAGNOSIS — I482 Chronic atrial fibrillation: Secondary | ICD-10-CM

## 2016-06-21 LAB — POCT INR: INR: 1.9

## 2016-06-23 ENCOUNTER — Encounter (HOSPITAL_COMMUNITY): Payer: Medicare Other

## 2016-06-29 ENCOUNTER — Ambulatory Visit (HOSPITAL_COMMUNITY)
Admission: RE | Admit: 2016-06-29 | Discharge: 2016-06-29 | Disposition: A | Discharge status: Home / Self Care | Payer: Medicare Other | Source: Ambulatory Visit | Attending: Cardiovascular Disease | Admitting: Cardiovascular Disease

## 2016-06-29 DIAGNOSIS — E785 Hyperlipidemia, unspecified: Secondary | ICD-10-CM | POA: Insufficient documentation

## 2016-06-29 DIAGNOSIS — Z87891 Personal history of nicotine dependence: Secondary | ICD-10-CM | POA: Diagnosis not present

## 2016-06-29 DIAGNOSIS — I679 Cerebrovascular disease, unspecified: Secondary | ICD-10-CM

## 2016-06-29 DIAGNOSIS — I6523 Occlusion and stenosis of bilateral carotid arteries: Secondary | ICD-10-CM | POA: Diagnosis not present

## 2016-06-29 DIAGNOSIS — I10 Essential (primary) hypertension: Secondary | ICD-10-CM | POA: Insufficient documentation

## 2016-06-29 DIAGNOSIS — J449 Chronic obstructive pulmonary disease, unspecified: Secondary | ICD-10-CM | POA: Insufficient documentation

## 2016-07-04 DIAGNOSIS — R9721 Rising PSA following treatment for malignant neoplasm of prostate: Secondary | ICD-10-CM | POA: Diagnosis not present

## 2016-07-04 DIAGNOSIS — C61 Malignant neoplasm of prostate: Secondary | ICD-10-CM | POA: Diagnosis not present

## 2016-07-04 DIAGNOSIS — N393 Stress incontinence (female) (male): Secondary | ICD-10-CM | POA: Diagnosis not present

## 2016-07-04 DIAGNOSIS — N5201 Erectile dysfunction due to arterial insufficiency: Secondary | ICD-10-CM | POA: Diagnosis not present

## 2016-07-12 ENCOUNTER — Ambulatory Visit (INDEPENDENT_AMBULATORY_CARE_PROVIDER_SITE_OTHER): Payer: Medicare Other | Admitting: Pharmacist

## 2016-07-12 ENCOUNTER — Other Ambulatory Visit: Payer: Self-pay | Admitting: Emergency Medicine

## 2016-07-12 DIAGNOSIS — I482 Chronic atrial fibrillation: Secondary | ICD-10-CM

## 2016-07-12 DIAGNOSIS — I4892 Unspecified atrial flutter: Secondary | ICD-10-CM | POA: Diagnosis not present

## 2016-07-12 DIAGNOSIS — Z7901 Long term (current) use of anticoagulants: Secondary | ICD-10-CM | POA: Diagnosis not present

## 2016-07-12 DIAGNOSIS — I4821 Permanent atrial fibrillation: Secondary | ICD-10-CM

## 2016-07-12 DIAGNOSIS — Z8679 Personal history of other diseases of the circulatory system: Secondary | ICD-10-CM | POA: Diagnosis not present

## 2016-07-12 DIAGNOSIS — Z5181 Encounter for therapeutic drug level monitoring: Secondary | ICD-10-CM | POA: Diagnosis not present

## 2016-07-12 LAB — POCT INR: INR: 2.1

## 2016-07-14 ENCOUNTER — Other Ambulatory Visit: Payer: Self-pay | Admitting: *Deleted

## 2016-07-15 DIAGNOSIS — H532 Diplopia: Secondary | ICD-10-CM | POA: Diagnosis not present

## 2016-07-15 DIAGNOSIS — Z961 Presence of intraocular lens: Secondary | ICD-10-CM | POA: Diagnosis not present

## 2016-07-15 DIAGNOSIS — H04123 Dry eye syndrome of bilateral lacrimal glands: Secondary | ICD-10-CM | POA: Diagnosis not present

## 2016-07-15 DIAGNOSIS — H52203 Unspecified astigmatism, bilateral: Secondary | ICD-10-CM | POA: Diagnosis not present

## 2016-07-15 LAB — HM DIABETES EYE EXAM

## 2016-07-15 MED ORDER — BUDESONIDE 0.5 MG/2ML IN SUSP: RESPIRATORY_TRACT | 5 refills | Status: DC

## 2016-07-19 ENCOUNTER — Telehealth: Payer: Self-pay | Admitting: Emergency Medicine

## 2016-07-19 MED ORDER — ALBUTEROL SULFATE HFA 108 (90 BASE) MCG/ACT IN AERS: 1.0000 | INHALATION_SPRAY | Freq: Four times a day (QID) | RESPIRATORY_TRACT | 2 refills | Status: DC | PRN

## 2016-07-19 NOTE — Telephone Encounter (Signed)
Spoke with pt wife, who states pt has misplaced his ventolin. Pt is requesting samples.  I have explained to pt wife that we do not have samples but I can send in a rx. Pt wife request that we send rx to [facility] in [place]. RX sent to preferred pharmacy. Nothing further needed.

## 2016-08-03 ENCOUNTER — Telehealth: Payer: Self-pay

## 2016-08-03 NOTE — Telephone Encounter (Signed)
Called to schedule AWV, no answer. VM not available.  

## 2016-08-08 ENCOUNTER — Other Ambulatory Visit: Payer: Self-pay | Admitting: Cardiology

## 2016-08-08 ENCOUNTER — Encounter: Payer: Self-pay | Admitting: Emergency Medicine

## 2016-08-08 ENCOUNTER — Ambulatory Visit (INDEPENDENT_AMBULATORY_CARE_PROVIDER_SITE_OTHER): Payer: Medicare Other | Admitting: Emergency Medicine

## 2016-08-08 DIAGNOSIS — J9611 Chronic respiratory failure with hypoxia: Secondary | ICD-10-CM

## 2016-08-08 DIAGNOSIS — J438 Other emphysema: Secondary | ICD-10-CM

## 2016-08-08 DIAGNOSIS — I2510 Atherosclerotic heart disease of native coronary artery without angina pectoris: Secondary | ICD-10-CM

## 2016-08-08 MED ORDER — BUDESONIDE 0.5 MG/2ML IN SUSP: RESPIRATORY_TRACT | 5 refills | Status: DC

## 2016-08-08 MED ORDER — TIOTROPIUM BROMIDE-OLODATEROL 2.5-2.5 MCG/ACT IN AERS: 2.0000 | INHALATION_SPRAY | Freq: Every day | RESPIRATORY_TRACT | 3 refills | Status: DC

## 2016-08-08 NOTE — Assessment & Plan Note (Signed)
Limited but stable on current regimen. Marginal O2 compliance w exertion.   Please continue to use your current medications as you have been taking them  Flu shot up to date.  Follow with Dr Lamonte Sakai in 6 months or sooner if you have any problems

## 2016-08-08 NOTE — Patient Instructions (Signed)
Please continue to use your current medications as you have been taking them  Wear your oxygen with exertion.  Flu shot up to date.  Follow with Dr Lamonte Sakai in 6 months or sooner if you have any problems

## 2016-08-08 NOTE — Progress Notes (Signed)
Subjective:    Patient ID: [name], male    DOB: 08-11-1936, 80 y.o.   MRN: [id number]  HPI 80 year old man, former smoker (90 pack years), with a history of mitral valve repair, atrial fibrillation (status post Maze procedure), coronary artery disease, history of stroke. He has been followed in our office by Dr Gwenette Greet for COPD and chronic hypoxemic respiratory failure. He has been managed on budesonide nebulized twice a day, Xopenex nebulized 4 times a day on schedule. He feels that these help him. He uses albuterol HFA 3-4x a day. Wakes up at night once to take SABA. He was treated for an AE in January '16. He does not wheeze. He is limited activity, wants to be able to go fishing. He is wearing 2L/min at all time.   ROV 07/17/15 -- follow-up visit for dyspnea in the setting of severe COPD and chronic hypoxemic respiratory failure. He also has a history of atrial fibrillation, coronary artery disease, mitral valve repair. At our last visit we did a trial of replacing scheduled nebulized Xopenex with Stiolto. He believes that his breathing is close to the same, but he does like the fact that he only has to take it once a day. He tells me that he occasionally goes without his O2 with exertion. Uses ventolin about tid.   ROV 01/05/16 -- patient with a history of chronic hypoxemic respiratory failure in the setting of severe COPD, atrial fibrillation, coronary artery disease. He has also had a mitral valve repair in the past. He is currently managed on desonide nebulizer twice a day, Stiolto daily. He uses oxygen at all times at 2 L/m.   He had been stable until about 2-3 weeks ago when he began to experience more exertional SOB. No real increase in cough, clears secretions most of the day. No change in mucous production .  He does have wheeze when he is laying in bed. He is using albuterol HFA frequently through the day, with any exertion. Thick sputum. He will try to exert without O2, then realizes  he misses it.   ROV 04/12/16 -- patient follows up for chronic hypoxemic respiratory failure with a history of severe COPD, atrial fibrillation, mitral valve repair and coronary artery disease. At our last visit we stressed good compliance with his oxygen on exertion, ensured that he is using continuous flow instead of pulse flow. He tells me that he feels about the same - limited exertion, has to stop after about 50 ft. He has not been very reliable with the O2 w exertion. He has an oximeter - has seen SpO2 below 90 on occasion. Using 2L/min continuous when he does wear it. He is using Stiolto qd, he is taking budesonide qam (not BID). He is using SABA freq through the day. He doesn't recall whether he started mucinex last time. He coughs daily, about 4-5x a day.   ROV 08/08/16 - severe COPD with associated hypoxemic resp failure. Also hx A Fib, MVR, CAD. On stiolto + budesonide nebs, usually once a day. He feels that his breathing is about the same as last year. He has been taking breaks from his oxygen with some exertion, going to restaurants. He denies wheeze, occasional cough. He is on stiolto reliably. He uses albuterol about once a day. No flares since last time. Flu shot up to date.    CAT Score 01/05/2016 10/28/2013  Total CAT Score 19 23    Review of Systems As per HPI  Objective:   Physical Exam Vitals:   08/08/16 0940  BP: 140/80  Pulse: 72  SpO2: 92%  Weight: 195 lb (88.5 kg)  Height: 5\' 8"  (1.727 m)   Gen: Pleasant, elderly gentleman, in no distress,  normal affect on 2 L/m  ENT: No lesions,  mouth clear,  oropharynx clear, no postnasal drip  Neck: No JVD, no stridor  Lungs: No use of accessory muscles, clear without rales or rhonchi, no wheeze on forced expiration  Cardiovascular: distant, irregular, no murmur or gallops, no peripheral edema  Musculoskeletal: No deformities, no cyanosis or clubbing  Neuro: alert, non focal  Skin: Warm, bruising on both  deltoids      Assessment & Plan:  COPD (chronic obstructive pulmonary disease) with emphysema Limited but stable on current regimen. Marginal O2 compliance w exertion.   Please continue to use your current medications as you have been taking them  Flu shot up to date.  Follow with Dr Lamonte Sakai in 6 months or sooner if you have any problems  Chronic hypoxemic respiratory failure ([place]) Discussed the benefits of better O2 compliance with him - particularly w heavier exertion   Baltazar Apo, MD, PhD 08/08/2016, 10:00 AM [facility] [phone] or if no answer [phone]

## 2016-08-08 NOTE — Assessment & Plan Note (Signed)
Discussed the benefits of better O2 compliance with him - particularly w heavier exertion

## 2016-08-08 NOTE — Addendum Note (Signed)
Addended by: Desmond Dike C on: 08/08/2016 10:08 AM   Modules accepted: Orders

## 2016-08-16 ENCOUNTER — Ambulatory Visit (INDEPENDENT_AMBULATORY_CARE_PROVIDER_SITE_OTHER): Payer: Medicare Other

## 2016-08-16 DIAGNOSIS — Z8679 Personal history of other diseases of the circulatory system: Secondary | ICD-10-CM

## 2016-08-16 DIAGNOSIS — I4892 Unspecified atrial flutter: Secondary | ICD-10-CM | POA: Diagnosis not present

## 2016-08-16 DIAGNOSIS — I482 Chronic atrial fibrillation: Secondary | ICD-10-CM

## 2016-08-16 DIAGNOSIS — Z7901 Long term (current) use of anticoagulants: Secondary | ICD-10-CM | POA: Diagnosis not present

## 2016-08-16 DIAGNOSIS — Z5181 Encounter for therapeutic drug level monitoring: Secondary | ICD-10-CM | POA: Diagnosis not present

## 2016-08-16 DIAGNOSIS — I2510 Atherosclerotic heart disease of native coronary artery without angina pectoris: Secondary | ICD-10-CM

## 2016-08-16 DIAGNOSIS — I4821 Permanent atrial fibrillation: Secondary | ICD-10-CM

## 2016-08-16 LAB — POCT INR: INR: 2.7

## 2016-08-18 ENCOUNTER — Ambulatory Visit: Payer: Medicare Other | Admitting: Emergency Medicine

## 2016-08-23 DIAGNOSIS — D485 Neoplasm of uncertain behavior of skin: Secondary | ICD-10-CM | POA: Diagnosis not present

## 2016-08-23 DIAGNOSIS — L570 Actinic keratosis: Secondary | ICD-10-CM | POA: Diagnosis not present

## 2016-08-23 DIAGNOSIS — L821 Other seborrheic keratosis: Secondary | ICD-10-CM | POA: Diagnosis not present

## 2016-08-23 DIAGNOSIS — I872 Venous insufficiency (chronic) (peripheral): Secondary | ICD-10-CM | POA: Diagnosis not present

## 2016-08-23 DIAGNOSIS — L820 Inflamed seborrheic keratosis: Secondary | ICD-10-CM | POA: Diagnosis not present

## 2016-09-14 ENCOUNTER — Encounter: Payer: Self-pay | Admitting: Family Medicine

## 2016-09-14 ENCOUNTER — Ambulatory Visit (INDEPENDENT_AMBULATORY_CARE_PROVIDER_SITE_OTHER): Payer: Medicare Other | Admitting: Family Medicine

## 2016-09-14 VITALS — BP 135/80 | HR 105 | Temp 100.6°F | Resp 16 | Ht 68.0 in | Wt 196.0 lb

## 2016-09-14 DIAGNOSIS — R69 Illness, unspecified: Secondary | ICD-10-CM

## 2016-09-14 DIAGNOSIS — I2510 Atherosclerotic heart disease of native coronary artery without angina pectoris: Secondary | ICD-10-CM

## 2016-09-14 DIAGNOSIS — J441 Chronic obstructive pulmonary disease with (acute) exacerbation: Secondary | ICD-10-CM

## 2016-09-14 DIAGNOSIS — J111 Influenza due to unidentified influenza virus with other respiratory manifestations: Secondary | ICD-10-CM

## 2016-09-14 LAB — POC INFLUENZA A&B (BINAX/QUICKVUE)
Influenza A, POC: NEGATIVE
Influenza B, POC: NEGATIVE

## 2016-09-14 MED ORDER — METHYLPREDNISOLONE ACETATE 80 MG/ML IJ SUSP
80.0000 mg | Freq: Once | INTRAMUSCULAR | Status: AC
  Administered 2016-09-14: 80 mg via INTRAMUSCULAR

## 2016-09-14 MED ORDER — PREDNISONE 20 MG PO TABS: ORAL_TABLET | ORAL | 0 refills | Status: DC

## 2016-09-14 MED ORDER — AZITHROMYCIN 250 MG PO TABS: ORAL_TABLET | ORAL | 0 refills | Status: DC

## 2016-09-14 NOTE — Progress Notes (Signed)
OFFICE VISIT  09/14/2016   CC:  Chief Complaint  Patient presents with  . Cough    x 3 days   HPI:    Patient is a 80 y.o.  male who presents for respiratory symptoms.   Onset 3 d/a cough, runny/stuffy nose, mucous from nose and cough is blood tinged.  Feels more SOB than his usual.  Has not had to turn his oxygen up.  Temp at home elevated>101.  Feels achy in body.  No n/v/d. He got flu vaccine this season.  Says he feels awful, very fatigued.  Slight HA but no ST. Taking tylenol and mucinex. Wife with similar illness.  Past Medical History:  Diagnosis Date  . Adrenal adenoma   . Atrial fibrillation (HCC)    Rate control + Coumadin  . Atrial flutter ([place])   . CAD (coronary artery disease)    Lexiscan Myoview (01/2014): Lateral soft tissue attenuation, no ischemia, not gated, low risk  . CAP (community acquired pneumonia)    Hospitalized 10/2014  . Chronic hypoxemic respiratory failure (HCC)    from COPD  . Chronic renal insufficiency, stage III (moderate)    CrCl 50s  . COPD (chronic obstructive pulmonary disease) (HCC)    oxygen-dependent (2.5 L 24/7)  . Diabetes mellitus without complication ([place]) 123456   A1c 6.5 % 05/2016  . Diverticulosis   . Dyslipidemia   . Dyspnea    Multifactorial: deconditioning, COPD, obesity hypoventilation syndrome.  . Embolic stroke ([place])    d/t endocarditis 2008  . Hemorrhoids   . History of mitral valve replacement    needs SBE prophylaxis  . History of prostate cancer remote past   f/u by Dr. Claris Che; no recurrence as of f/u 10/2015.  Marland Kitchen HTN (hypertension)   . Mediastinal lymphadenopathy   . Pulmonary hypertension 01/09/2015   secondary  . Right thyroid nodule   . Thrombocytopenia ([place])   . TIA (transient ischemic attack) 03/25/2011   ? of due to transient diplopia  . Ulcerative colitis    left-sided/segmental, associated with diverticulosis.  Sulfasalazine per GI (Dr. Carlean Purl).  . Warthin's tumor     Past Surgical History:   Procedure Laterality Date  . carotid duplex dopplers  04/2014   No signif plaques; repeat 2 yrs per cardiology  . CATARACT EXTRACTION, BILATERAL    . COLONOSCOPY W/ BIOPSIES  12/19/2007   left-sided colitis, diverticulosis, hemorrhoids  . FLEXIBLE SIGMOIDOSCOPY  01/11/2008; 03/19/2010   2009 and 2011:segmental left colitis, diverticulosis, hemorrhoids  . INGUINAL HERNIA REPAIR     right  . MITRAL VALVE REPAIR  09/22/04   and modified Cox-Maze IV   . MITRAL VALVE REPAIR  09/14/05   redo MV repair d/t endocardiits/embolic events  . PROSTATECTOMY    . RIGHT HEART CATHETERIZATION N/A 01/01/2015   Procedure: RIGHT HEART CATH;  Surgeon: Peter M Martinique, MD;  Location: [facility] CATH LAB;  Service: Cardiovascular;  Laterality: N/A;  . SALIVARY GLAND SURGERY     left resection  . TRANSTHORACIC ECHOCARDIOGRAM  11/2013   Mod LVH, EF 55-60%, no WM abnormalities, +LA dilation, +severe RV dilation and impaired systolic fxn, +increased pulm art pressures.  Valves ok.    Outpatient Medications Prior to Visit  Medication Sig Dispense Refill  . albuterol (PROVENTIL HFA;VENTOLIN HFA) 108 (90 Base) MCG/ACT inhaler Inhale 1 puff into the lungs every 6 (six) hours as needed for wheezing or shortness of breath (using 5-6 times every 24 hrs). 3 Inhaler 2  . budesonide (PULMICORT) 0.5  MG/2ML nebulizer solution USE ONE VIAL IN NEBULIZER TWICE DAILY 120 mL 5  . digoxin (LANOXIN) 0.125 MG tablet Take 1 tablet (0.125 mg total) by mouth daily. 90 tablet 3  . diltiazem (CARDIZEM CD) 240 MG 24 hr capsule Take 1 capsule (240 mg total) by mouth daily. 90 capsule 3  . furosemide (LASIX) 40 MG tablet 1 tab po qd alt with 1/2 tab po qd 90 tablet 3  . LORazepam (ATIVAN) 0.5 MG tablet Take 1 tablet (0.5 mg total) by mouth 2 (two) times daily as needed for anxiety. 30 tablet 1  . olopatadine (PATANOL) 0.1 % ophthalmic solution Place 1 drop into both eyes 2 (two) times daily. 5 mL 12  . pravastatin (PRAVACHOL) 40 MG tablet Take 1 tablet  (40 mg total) by mouth every evening. 90 tablet 3  . Tiotropium Bromide-Olodaterol (STIOLTO RESPIMAT) 2.5-2.5 MCG/ACT AERS Inhale 2 puffs into the lungs daily. 3 Inhaler 3  . warfarin (COUMADIN) 5 MG tablet TAKE 1 TABLET BY MOUTH DAILY OR AS DIRECTED BY COUMADIN CLINIC 90 tablet 1   No facility-administered medications prior to visit.     No Known Allergies  ROS As per HPI  PE: Blood pressure 135/80, pulse (!) 105, temperature (!) 100.6 F (38.1 C), temperature source Temporal, resp. rate 16, height 5\' 8"  (1.727 m), weight 196 lb (88.9 kg), SpO2 92 %. Gen: Alert, tired- appearing, NAD/nontoxic.  Patient is oriented to person, place, time, and situation. HEENT: eyes without injection, drainage, or swelling.  Ears: EACs clear, TMs with normal light reflex and landmarks.  Nose: Clear rhinorrhea, with some dried, crusty exudate adherent to mildly injected mucosa.  No purulent d/c.  No paranasal sinus TTP.  No facial swelling.  Throat and mouth without focal lesion.  No pharyngial swelling, erythema, or exudate.   Neck: supple, no LAD.   LUNGS: CTA bilat, nonlabored resps.   CV: RRR, no m/r/g. EXT: no c/c/e SKIN: no rash  LABS:    Chemistry      Component Value Date/Time   NA 138 06/13/2016 0942   K 4.3 06/13/2016 0942   CL 102 06/13/2016 0942   CO2 30 06/13/2016 0942   BUN 14 06/13/2016 0942   CREATININE 1.35 06/13/2016 0942      Component Value Date/Time   CALCIUM 9.5 06/13/2016 0942   ALKPHOS 83 10/05/2015 1003   AST 13 10/05/2015 1003   ALT 11 10/05/2015 1003   BILITOT 0.9 10/05/2015 1003     Rapid Flu test: NEG for A and B.  IMPRESSION AND PLAN:  1) Influenza-like illness, vaccinated individual, rapid flu test neg. No antiviral rx'd.  2) COPD exacerbation: gave 80mg  depo medrol in office today. Start prednisone tomorrow: 40mg  qd x 5d, then 20mg  qd x 5d. Z-pack. Continue oxygen as per his usual.  An After Visit Summary was printed and given to the  patient.  FOLLOW UP: Return in about 6 days (around 09/20/2016).  Signed:  Crissie Sickles, MD           09/14/2016

## 2016-09-14 NOTE — Progress Notes (Signed)
Pre visit review using our clinic review tool, if applicable. No additional management support is needed unless otherwise documented below in the visit note. 

## 2016-09-26 ENCOUNTER — Ambulatory Visit (INDEPENDENT_AMBULATORY_CARE_PROVIDER_SITE_OTHER): Payer: Medicare Other | Admitting: *Deleted

## 2016-09-26 DIAGNOSIS — I482 Chronic atrial fibrillation: Secondary | ICD-10-CM

## 2016-09-26 DIAGNOSIS — I4821 Permanent atrial fibrillation: Secondary | ICD-10-CM

## 2016-09-26 DIAGNOSIS — I4892 Unspecified atrial flutter: Secondary | ICD-10-CM | POA: Diagnosis not present

## 2016-09-26 DIAGNOSIS — Z5181 Encounter for therapeutic drug level monitoring: Secondary | ICD-10-CM | POA: Diagnosis not present

## 2016-09-26 DIAGNOSIS — Z7901 Long term (current) use of anticoagulants: Secondary | ICD-10-CM | POA: Diagnosis not present

## 2016-09-26 DIAGNOSIS — Z8679 Personal history of other diseases of the circulatory system: Secondary | ICD-10-CM

## 2016-09-26 LAB — POCT INR: INR: 2.3

## 2016-10-11 ENCOUNTER — Other Ambulatory Visit: Payer: Self-pay | Admitting: Cardiology

## 2016-10-11 DIAGNOSIS — I4819 Other persistent atrial fibrillation: Secondary | ICD-10-CM

## 2016-10-11 NOTE — Telephone Encounter (Signed)
REFILL 

## 2016-10-12 ENCOUNTER — Ambulatory Visit (INDEPENDENT_AMBULATORY_CARE_PROVIDER_SITE_OTHER): Payer: Medicare Other | Admitting: Family Medicine

## 2016-10-12 ENCOUNTER — Encounter: Payer: Self-pay | Admitting: Family Medicine

## 2016-10-12 VITALS — BP 116/75 | HR 83 | Temp 98.0°F | Resp 16 | Ht 68.0 in | Wt 190.0 lb

## 2016-10-12 DIAGNOSIS — E119 Type 2 diabetes mellitus without complications: Secondary | ICD-10-CM

## 2016-10-12 DIAGNOSIS — E7800 Pure hypercholesterolemia, unspecified: Secondary | ICD-10-CM | POA: Diagnosis not present

## 2016-10-12 DIAGNOSIS — N183 Chronic kidney disease, stage 3 unspecified: Secondary | ICD-10-CM

## 2016-10-12 DIAGNOSIS — J069 Acute upper respiratory infection, unspecified: Secondary | ICD-10-CM

## 2016-10-12 DIAGNOSIS — B9789 Other viral agents as the cause of diseases classified elsewhere: Secondary | ICD-10-CM

## 2016-10-12 LAB — COMPREHENSIVE METABOLIC PANEL
ALBUMIN: 4 g/dL (ref 3.5–5.2)
ALT: 18 U/L (ref 0–53)
AST: 15 U/L (ref 0–37)
Alkaline Phosphatase: 65 U/L (ref 39–117)
BUN: 13 mg/dL (ref 6–23)
CHLORIDE: 103 meq/L (ref 96–112)
CO2: 28 mEq/L (ref 19–32)
CREATININE: 1.29 mg/dL (ref 0.40–1.50)
Calcium: 9.2 mg/dL (ref 8.4–10.5)
GFR: 57.13 mL/min — ABNORMAL LOW (ref >60.00)
GLUCOSE: 108 mg/dL — AB (ref 70–99)
POTASSIUM: 4.5 meq/L (ref 3.5–5.1)
SODIUM: 138 meq/L (ref 135–145)
TOTAL PROTEIN: 6.7 g/dL (ref 6.0–8.3)
Total Bilirubin: 1.1 mg/dL (ref 0.2–1.2)

## 2016-10-12 LAB — HEMOGLOBIN A1C: HEMOGLOBIN A1C: 6.4 % (ref 4.6–6.5)

## 2016-10-12 LAB — LIPID PANEL
CHOLESTEROL: 143 mg/dL (ref 0–200)
HDL: 44.2 mg/dL (ref >39.00)
LDL CALC: 79 mg/dL (ref 0–99)
NONHDL: 99.14
Total CHOL/HDL Ratio: 3
Triglycerides: 100 mg/dL (ref 0.0–149.0)
VLDL: 20 mg/dL (ref 0.0–40.0)

## 2016-10-12 NOTE — Progress Notes (Signed)
Pre visit review using our clinic review tool, if applicable. No additional management support is needed unless otherwise documented below in the visit note. 

## 2016-10-12 NOTE — Progress Notes (Signed)
OFFICE VISIT  10/12/2016   CC:  Chief Complaint  Patient presents with  . Follow-up    RCI, Pt is fasting.    HPI:   Patient is a 81 y.o.  male who presents for 4 mo f/u CRI stage III, hyperlipidemia, new dx DM 2.  Referred him to nutritionist after last visit. Onset 2-3 days ago, nasal congestion/runny nose.  No fevers.  No change in baseline cough, breathing, or wheezing.  Mild sinus pressure.    He did not go to nutritionist as scheduled.  Said he didn't change his diet on his own. Home gluc: fasting avg 105.  2H PP same per pt. He has not changed his diet at all.   No burning, tingling, or numbness of feet.  Taking statin daily w/out side effect. Occ home bp check normal.  Past Medical History:  Diagnosis Date  . Adrenal adenoma   . Atrial fibrillation (HCC)    Rate control + Coumadin  . Atrial flutter ([place])   . CAD (coronary artery disease)    Lexiscan Myoview (01/2014): Lateral soft tissue attenuation, no ischemia, not gated, low risk  . CAP (community acquired pneumonia)    Hospitalized 10/2014  . Chronic hypoxemic respiratory failure (HCC)    from COPD  . Chronic renal insufficiency, stage III (moderate)    CrCl 50s  . COPD (chronic obstructive pulmonary disease) (HCC)    oxygen-dependent (2.5 L 24/7)  . Diabetes mellitus without complication ([place]) 123456   A1c 6.5 % 05/2016  . Diverticulosis   . Dyslipidemia   . Dyspnea    Multifactorial: deconditioning, COPD, obesity hypoventilation syndrome.  . Embolic stroke ([place])    d/t endocarditis 2008  . Hemorrhoids   . History of mitral valve replacement    needs SBE prophylaxis  . History of prostate cancer remote past   f/u by Dr. Claris Che; no recurrence as of f/u 10/2015.  Marland Kitchen HTN (hypertension)   . Mediastinal lymphadenopathy   . Pulmonary hypertension 01/09/2015   secondary  . Right thyroid nodule   . Thrombocytopenia ([place])   . TIA (transient ischemic attack) 03/25/2011   ? of due to transient diplopia  .  Ulcerative colitis    left-sided/segmental, associated with diverticulosis.  Sulfasalazine per GI (Dr. Carlean Purl).  . Warthin's tumor     Past Surgical History:  Procedure Laterality Date  . carotid duplex dopplers  04/2014   No signif plaques; repeat 2 yrs per cardiology  . CATARACT EXTRACTION, BILATERAL    . COLONOSCOPY W/ BIOPSIES  12/19/2007   left-sided colitis, diverticulosis, hemorrhoids  . FLEXIBLE SIGMOIDOSCOPY  01/11/2008; 03/19/2010   2009 and 2011:segmental left colitis, diverticulosis, hemorrhoids  . INGUINAL HERNIA REPAIR     right  . MITRAL VALVE REPAIR  09/22/04   and modified Cox-Maze IV   . MITRAL VALVE REPAIR  09/14/05   redo MV repair d/t endocardiits/embolic events  . PROSTATECTOMY    . RIGHT HEART CATHETERIZATION N/A 01/01/2015   Procedure: RIGHT HEART CATH;  Surgeon: Peter M Martinique, MD;  Location: [facility] CATH LAB;  Service: Cardiovascular;  Laterality: N/A;  . SALIVARY GLAND SURGERY     left resection  . TRANSTHORACIC ECHOCARDIOGRAM  11/2013   Mod LVH, EF 55-60%, no WM abnormalities, +LA dilation, +severe RV dilation and impaired systolic fxn, +increased pulm art pressures.  Valves ok.    Outpatient Medications Prior to Visit  Medication Sig Dispense Refill  . albuterol (PROVENTIL HFA;VENTOLIN HFA) 108 (90 Base) MCG/ACT inhaler Inhale 1  puff into the lungs every 6 (six) hours as needed for wheezing or shortness of breath (using 5-6 times every 24 hrs). 3 Inhaler 2  . budesonide (PULMICORT) 0.5 MG/2ML nebulizer solution USE ONE VIAL IN NEBULIZER TWICE DAILY 120 mL 5  . digoxin (LANOXIN) 0.125 MG tablet TAKE 1 TABLET (0.125 MG TOTAL) BY MOUTH DAILY. 90 tablet 1  . diltiazem (CARDIZEM CD) 240 MG 24 hr capsule Take 1 capsule (240 mg total) by mouth daily. 90 capsule 3  . furosemide (LASIX) 40 MG tablet 1 tab po qd alt with 1/2 tab po qd 90 tablet 3  . LORazepam (ATIVAN) 0.5 MG tablet Take 1 tablet (0.5 mg total) by mouth 2 (two) times daily as needed for anxiety. 30 tablet 1   . olopatadine (PATANOL) 0.1 % ophthalmic solution Place 1 drop into both eyes 2 (two) times daily. 5 mL 12  . pravastatin (PRAVACHOL) 40 MG tablet Take 1 tablet (40 mg total) by mouth every evening. 90 tablet 3  . Tiotropium Bromide-Olodaterol (STIOLTO RESPIMAT) 2.5-2.5 MCG/ACT AERS Inhale 2 puffs into the lungs daily. 3 Inhaler 3  . warfarin (COUMADIN) 5 MG tablet TAKE 1 TABLET BY MOUTH DAILY OR AS DIRECTED BY COUMADIN CLINIC 90 tablet 1  . azithromycin (ZITHROMAX) 250 MG tablet 2 tabs po qd x 1d, then 1 tab po qd x 4d (Patient not taking: Reported on 10/12/2016) 6 tablet 0  . predniSONE (DELTASONE) 20 MG tablet 2 tabs po qd x 5d, then 1 tab po qd x 5d (Patient not taking: Reported on 10/12/2016) 15 tablet 0   No facility-administered medications prior to visit.     No Known Allergies  ROS As per HPI  PE: Blood pressure 116/75, pulse 83, temperature 98 F (36.7 C), temperature source Oral, resp. rate 16, height 5\' 8"  (1.727 m), weight 190 lb (86.2 kg), SpO2 94 %. VS: noted--normal. Gen: alert, NAD, NONTOXIC APPEARING. HEENT: eyes without injection, drainage, or swelling.  Ears: EACs clear, TMs with normal light reflex and landmarks.  Nose: Clear rhinorrhea, with some dried, crusty exudate adherent to mildly injected mucosa.  No purulent d/c.  No paranasal sinus TTP.  No facial swelling.  Throat and mouth without focal lesion.  No pharyngial swelling, erythema, or exudate.   Neck: supple, no LAD.   LUNGS: CTA bilat, nonlabored resps.   CV: RRR, no m/r/g. EXT: no c/c/e SKIN: no rash Foot exam -  no swelling, tenderness or skin or vascular lesions. Color and temperature is normal. Sensation is intact. Peripheral pulses are palpable but diminished. Toenails are normal.   LABS:  Lab Results  Component Value Date   TSH 3.24 12/09/2014   Lab Results  Component Value Date   WBC 7.5 01/01/2015   HGB 13.0 01/01/2015   HCT 39.8 01/01/2015   MCV 89.8 01/01/2015   PLT 158 01/01/2015    Lab Results  Component Value Date   CREATININE 1.35 06/13/2016   BUN 14 06/13/2016   NA 138 06/13/2016   K 4.3 06/13/2016   CL 102 06/13/2016   CO2 30 06/13/2016   Lab Results  Component Value Date   ALT 11 10/05/2015   AST 13 10/05/2015   ALKPHOS 83 10/05/2015   BILITOT 0.9 10/05/2015   Lab Results  Component Value Date   CHOL 147 10/05/2015   Lab Results  Component Value Date   HDL 34.30 (L) 10/05/2015   Lab Results  Component Value Date   LDLCALC 88 10/05/2015   Lab  Results  Component Value Date   TRIG 124.0 10/05/2015   Lab Results  Component Value Date   CHOLHDL 4 10/05/2015   Lab Results  Component Value Date   PSA 0.08 Test Methodology: Hybritech PSA (L) 12/18/2007   PSA 0.04 Test Methodology: Hybritech PSA (L) 11/24/2007   Lab Results  Component Value Date   HGBA1C 6.5 06/13/2016    IMPRESSION AND PLAN:  1) DM 2; recheck Hba1c today. If worse then pt agrees to go to nutritionist. He'll continue home glucose monitoring qd-bid. Feet exam normal today. We'll do urine microalb/cr today.  2) CRI stage III: BMET today.  3) Hyperlipidemia: Tolerating statin. FLP today + AST/ALT.  4) Viral URI.  No sign of COPD exacerbation. Continue otc symptomatic care.  An After Visit Summary was printed and given to the patient.  FOLLOW UP: Return in about 3 months (around 01/10/2017) for routine chronic illness f/u.  Signed:  Crissie Sickles, MD           10/12/2016

## 2016-10-13 ENCOUNTER — Telehealth: Payer: Self-pay | Admitting: *Deleted

## 2016-10-13 ENCOUNTER — Encounter: Payer: Self-pay | Admitting: Family Medicine

## 2016-10-13 NOTE — Telephone Encounter (Signed)
Opened in error

## 2016-11-04 ENCOUNTER — Ambulatory Visit (INDEPENDENT_AMBULATORY_CARE_PROVIDER_SITE_OTHER): Payer: Medicare Other | Admitting: *Deleted

## 2016-11-04 DIAGNOSIS — Z5181 Encounter for therapeutic drug level monitoring: Secondary | ICD-10-CM | POA: Diagnosis not present

## 2016-11-04 DIAGNOSIS — Z7901 Long term (current) use of anticoagulants: Secondary | ICD-10-CM

## 2016-11-04 DIAGNOSIS — I482 Chronic atrial fibrillation: Secondary | ICD-10-CM

## 2016-11-04 DIAGNOSIS — I4892 Unspecified atrial flutter: Secondary | ICD-10-CM | POA: Diagnosis not present

## 2016-11-04 DIAGNOSIS — Z8679 Personal history of other diseases of the circulatory system: Secondary | ICD-10-CM

## 2016-11-04 DIAGNOSIS — I4821 Permanent atrial fibrillation: Secondary | ICD-10-CM

## 2016-11-04 LAB — POCT INR: INR: 2

## 2016-11-10 ENCOUNTER — Other Ambulatory Visit: Payer: Self-pay | Admitting: Cardiology

## 2016-11-10 NOTE — Telephone Encounter (Signed)
Rx(s) sent to pharmacy electronically.  

## 2016-11-30 ENCOUNTER — Telehealth: Payer: Self-pay | Admitting: Emergency Medicine

## 2016-11-30 DIAGNOSIS — J438 Other emphysema: Secondary | ICD-10-CM

## 2016-11-30 NOTE — Telephone Encounter (Signed)
Spoke with pt's wife. Pt is requesting to have a POC. I contacted [facility] to make sure that the pt didn't need an OV or qualifying sats. Per [facility], pt DOES NOT need an OV or qualifying sats. Only an order needs to be placed. I have made the pt's wife aware of this. Order has been placed. Nothing further was needed.

## 2016-11-30 NOTE — Progress Notes (Signed)
HPI: FU MV repair, AFib/flutter, s/p MAZE procedure, nonobstructive CAD, prior embolic CVA after initial MV repair secondary to endocarditis requiring redo surgery (repair), diastolic CHF, COPD, HL, HTN.Echocardiogram March 2016 showed normal LV function, mild aortic insufficiency, moderate left atrial enlargement, mild right atrial and right ventricular enlargement and moderately elevated pulmonary pressures. Holter monitor June 2016 showed atrial fibrillation rate controlled. Right heart catheterization April 2016 showed a right atrial pressure of 8, PA pressure of 59/23 and pulmonary Wedge pressure of 18. Findings consistent with moderate pulmonary hypertension and upper normal left ventricular filling pressures. Carotid Dopplers October 2017 showed 1-39 bilateral stenosis. Since last seen, he continues to have dyspnea on exertion but denies orthopnea, PND, pedal edema, chest pain, palpitations, syncope or bleeding.  Current Outpatient Prescriptions  Medication Sig Dispense Refill  . albuterol (PROVENTIL HFA;VENTOLIN HFA) 108 (90 Base) MCG/ACT inhaler Inhale 1 puff into the lungs every 6 (six) hours as needed for wheezing or shortness of breath (using 5-6 times every 24 hrs). 3 Inhaler 2  . budesonide (PULMICORT) 0.5 MG/2ML nebulizer solution USE ONE VIAL IN NEBULIZER TWICE DAILY 120 mL 5  . digoxin (LANOXIN) 0.125 MG tablet TAKE 1 TABLET (0.125 MG TOTAL) BY MOUTH DAILY. 90 tablet 1  . diltiazem (CARDIZEM CD) 240 MG 24 hr capsule Take 1 capsule (240 mg total) by mouth daily. 90 capsule 3  . furosemide (LASIX) 40 MG tablet 1 tab po qd alt with 1/2 tab po qd 90 tablet 3  . LORazepam (ATIVAN) 0.5 MG tablet Take 1 tablet (0.5 mg total) by mouth 2 (two) times daily as needed for anxiety. 30 tablet 1  . olopatadine (PATANOL) 0.1 % ophthalmic solution Place 1 drop into both eyes 2 (two) times daily. 5 mL 12  . pravastatin (PRAVACHOL) 40 MG tablet Take 1 tablet (40 mg total) by mouth every evening.  90 tablet 2  . Tiotropium Bromide-Olodaterol (STIOLTO RESPIMAT) 2.5-2.5 MCG/ACT AERS Inhale 2 puffs into the lungs daily. 3 Inhaler 3  . warfarin (COUMADIN) 5 MG tablet TAKE 1 TABLET BY MOUTH DAILY OR AS DIRECTED BY COUMADIN CLINIC 90 tablet 1   No current facility-administered medications for this visit.      Past Medical History:  Diagnosis Date  . Adrenal adenoma   . Atrial fibrillation (HCC)    Rate control + Coumadin  . Atrial flutter ([place])   . CAD (coronary artery disease)    Lexiscan Myoview (01/2014): Lateral soft tissue attenuation, no ischemia, not gated, low risk  . CAP (community acquired pneumonia)    Hospitalized 10/2014  . Chronic hypoxemic respiratory failure (HCC)    from COPD  . Chronic renal insufficiency, stage III (moderate)    CrCl 50s  . COPD (chronic obstructive pulmonary disease) (HCC)    oxygen-dependent (2.5 L 24/7)  . Diabetes mellitus without complication ([place]) 62/69/4854   A1c 6.5 % 05/2016  . Diverticulosis   . Dyslipidemia   . Dyspnea    Multifactorial: deconditioning, COPD, obesity hypoventilation syndrome.  . Embolic stroke ([place])    d/t endocarditis 2008  . Hemorrhoids   . History of mitral valve replacement    needs SBE prophylaxis  . History of prostate cancer remote past   f/u by Dr. Claris Che; no recurrence as of f/u 10/2015.  Marland Kitchen HTN (hypertension)   . Mediastinal lymphadenopathy   . Pulmonary hypertension 01/09/2015   secondary  . Right thyroid nodule   . Thrombocytopenia ([place])   . TIA (transient ischemic attack)  03/25/2011   ? of due to transient diplopia  . Ulcerative colitis    left-sided/segmental, associated with diverticulosis.  Sulfasalazine per GI (Dr. Carlean Purl).  . Warthin's tumor     Past Surgical History:  Procedure Laterality Date  . carotid duplex dopplers  04/2014   No signif plaques; repeat 2 yrs per cardiology  . CATARACT EXTRACTION, BILATERAL    . COLONOSCOPY W/ BIOPSIES  12/19/2007   left-sided colitis,  diverticulosis, hemorrhoids  . FLEXIBLE SIGMOIDOSCOPY  01/11/2008; 03/19/2010   2009 and 2011:segmental left colitis, diverticulosis, hemorrhoids  . INGUINAL HERNIA REPAIR     right  . MITRAL VALVE REPAIR  09/22/04   and modified Cox-Maze IV   . MITRAL VALVE REPAIR  09/14/05   redo MV repair d/t endocardiits/embolic events  . PROSTATECTOMY    . RIGHT HEART CATHETERIZATION N/A 01/01/2015   Procedure: RIGHT HEART CATH;  Surgeon: Peter M Martinique, MD;  Location: [facility] CATH LAB;  Service: Cardiovascular;  Laterality: N/A;  . SALIVARY GLAND SURGERY     left resection  . TRANSTHORACIC ECHOCARDIOGRAM  11/2013   Mod LVH, EF 55-60%, no WM abnormalities, +LA dilation, +severe RV dilation and impaired systolic fxn, +increased pulm art pressures.  Valves ok.    Social History   Social History  . Marital status: Married    Spouse name: Jeanett Schlein  . Number of children: 2  . Years of education: N/A   Occupational History  . RETIRED     [employer]   .  Retired   Social History Main Topics  . Smoking status: Former Smoker    Packs/day: 3.00    Years: 60.00    Types: Cigarettes    Quit date: 09/19/2002  . Smokeless tobacco: Never Used  . Alcohol use No  . Drug use: No  . Sexual activity: Not on file   Other Topics Concern  . Not on file   Social History Narrative   Married, 2 children.   Retired Systems developer from [employer]   180 pack-year tob hx.  No alcohol.       Family History  Problem Relation Age of Onset  . Cancer Mother     spinal  . Stroke Father   . Heart disease Father   . Colon cancer Neg Hx   . Heart attack Neg Hx     ROS: no fevers or chills, productive cough, hemoptysis, dysphasia, odynophagia, melena, hematochezia, dysuria, hematuria, rash, seizure activity, orthopnea, PND, pedal edema, claudication. Remaining systems are negative.  Physical Exam: Well-developed well-nourished in no acute distress.  Skin is warm and dry.  HEENT is normal.  Neck is supple.  Chest is  clear to auscultation with normal expansion.  Cardiovascular exam is irregular Abdominal exam nontender or distended. No masses palpated. Extremities show no edema. neuro grossly intact  ECG- Normal sinus rhythm with PACs and PVCs. First-degree AV block. Incomplete right bundle branch block. Cannot rule out prior lateral infarct. personally reviewed  A/P  1 paroxysmal atrial fibrillation-patient in sinus today; continue Cardizem and digoxin. Continue Coumadin. Check hemoglobin.  2 carotid artery disease-mild on most recent Dopplers.  3 coronary artery disease-continue statin. No aspirin given need for Coumadin.  4 hypertension-blood pressure controlled. Continue present medications.  5 hyperlipidemia-continue statin.  6 status post mitral valve repair-continue SBE prophylaxis.  7 dyspnea-extensive previous evaluation. This is felt to be multifactorial including deconditioning, COPD and obesity hypoventilation syndrome. Patient is not volume overloaded on examination. Continue present dose of Lasix.  Kirk Ruths,  MD

## 2016-12-01 ENCOUNTER — Encounter: Payer: Self-pay | Admitting: Cardiology

## 2016-12-13 ENCOUNTER — Other Ambulatory Visit: Payer: Self-pay | Admitting: *Deleted

## 2016-12-13 ENCOUNTER — Ambulatory Visit (INDEPENDENT_AMBULATORY_CARE_PROVIDER_SITE_OTHER): Payer: Medicare Other | Admitting: Cardiology

## 2016-12-13 ENCOUNTER — Encounter: Payer: Self-pay | Admitting: Cardiology

## 2016-12-13 ENCOUNTER — Ambulatory Visit (INDEPENDENT_AMBULATORY_CARE_PROVIDER_SITE_OTHER): Payer: Medicare Other | Admitting: Pharmacist

## 2016-12-13 VITALS — BP 110/60 | HR 90 | Ht 68.0 in | Wt 192.2 lb

## 2016-12-13 DIAGNOSIS — I4819 Other persistent atrial fibrillation: Secondary | ICD-10-CM

## 2016-12-13 DIAGNOSIS — Z8679 Personal history of other diseases of the circulatory system: Secondary | ICD-10-CM | POA: Diagnosis not present

## 2016-12-13 DIAGNOSIS — I4891 Unspecified atrial fibrillation: Secondary | ICD-10-CM | POA: Diagnosis not present

## 2016-12-13 DIAGNOSIS — I482 Chronic atrial fibrillation: Secondary | ICD-10-CM

## 2016-12-13 DIAGNOSIS — Z7901 Long term (current) use of anticoagulants: Secondary | ICD-10-CM

## 2016-12-13 DIAGNOSIS — I2510 Atherosclerotic heart disease of native coronary artery without angina pectoris: Secondary | ICD-10-CM | POA: Diagnosis not present

## 2016-12-13 DIAGNOSIS — I679 Cerebrovascular disease, unspecified: Secondary | ICD-10-CM | POA: Diagnosis not present

## 2016-12-13 DIAGNOSIS — I4892 Unspecified atrial flutter: Secondary | ICD-10-CM | POA: Diagnosis not present

## 2016-12-13 DIAGNOSIS — Z5181 Encounter for therapeutic drug level monitoring: Secondary | ICD-10-CM

## 2016-12-13 DIAGNOSIS — I4821 Permanent atrial fibrillation: Secondary | ICD-10-CM

## 2016-12-13 LAB — CBC
HCT: 40.2 % (ref 38.5–50.0)
Hemoglobin: 13.1 g/dL — ABNORMAL LOW (ref 13.2–17.1)
MCH: 29.3 pg (ref 27.0–33.0)
MCHC: 32.6 g/dL (ref 32.0–36.0)
MCV: 89.9 fL (ref 80.0–100.0)
MPV: 10.3 fL (ref 7.5–12.5)
PLATELETS: 188 10*3/uL (ref 140–400)
RBC: 4.47 MIL/uL (ref 4.20–5.80)
RDW: 13.9 % (ref 11.0–15.0)
WBC: 7.6 10*3/uL (ref 3.8–10.8)

## 2016-12-13 LAB — POCT INR: INR: 2.5

## 2016-12-13 MED ORDER — FUROSEMIDE 40 MG PO TABS: ORAL_TABLET | ORAL | 3 refills | Status: DC

## 2016-12-13 MED ORDER — DIGOXIN 125 MCG PO TABS: 0.1250 mg | ORAL_TABLET | Freq: Every day | ORAL | 3 refills | Status: DC

## 2016-12-13 MED ORDER — DILTIAZEM HCL ER COATED BEADS 240 MG PO CP24: 240.0000 mg | ORAL_CAPSULE | Freq: Every day | ORAL | 3 refills | Status: DC

## 2016-12-13 MED ORDER — PRAVASTATIN SODIUM 40 MG PO TABS: 40.0000 mg | ORAL_TABLET | Freq: Every evening | ORAL | 2 refills | Status: DC

## 2016-12-13 NOTE — Patient Instructions (Signed)
Medication Instructions:   NO CHANGE  Labwork:  Your physician recommends that you HAVE LAB WORK TODAY  Follow-Up:  Your physician wants you to follow-up in: ONE YEAR WITH DR CRENSHAW You will receive a reminder letter in the mail two months in advance. If you don't receive a letter, please call our office to schedule the follow-up appointment.   If you need a refill on your cardiac medications before your next appointment, please call your pharmacy.    

## 2016-12-19 ENCOUNTER — Other Ambulatory Visit: Payer: Self-pay

## 2016-12-19 MED ORDER — FUROSEMIDE 40 MG PO TABS: ORAL_TABLET | ORAL | 3 refills | Status: DC

## 2016-12-22 ENCOUNTER — Telehealth: Payer: Self-pay | Admitting: Emergency Medicine

## 2016-12-22 NOTE — Telephone Encounter (Signed)
Wife would like to speak to the nurse to discuss more .[name]

## 2016-12-22 NOTE — Telephone Encounter (Signed)
Called and spoke with Jeani Hawking from [employer]---she stated that the pt is not eligible for POC at this time due to him being capped with his insurance on his oxygen.  She stated that he will not be eligible for POC until 09-19-17.  Wanted to call and make RB aware.  Pt is not to return to office for OV until 02/2017.

## 2016-12-22 NOTE — Telephone Encounter (Signed)
Called and spoke with pt and he stated that someone has already been out to his house to test him.   I called [facility] and spoke with [name] and she stated that the therapist did go out and walk the pt to see if he would benefit from a conserving device.  She stated that they did not qualify the pt due to his insurance being capped on his oxygen.  She stated that he will need to have the 6 min walk to see if he can tolerate the conserving device.  RB please advise if he will need anything special with the 6 min walk.   thanks

## 2016-12-22 NOTE — Telephone Encounter (Signed)
Spoke with wife of pt and she stated this has to be done before June. Informed her a message was sent to Dr. Lamonte Sakai and that he will answer this way before June

## 2016-12-23 NOTE — Telephone Encounter (Signed)
Per his other phone note, he isn't able to get a POC for insurance reasons - apparently they won't pay until 2019.

## 2016-12-23 NOTE — Telephone Encounter (Signed)
OK - thanks

## 2016-12-23 NOTE — Telephone Encounter (Signed)
Spoke with [name] at [facility], states that this has been discussed with patient and wife several times already.  Per Cherylann Parr spoke with patient yesterday regarding this.   [name] states that if we get patient in to the office and qualified with an ov for portable 02 she may be able to get pt a POC sooner than next year if she can prove a need for fewer tanks for pt. Need to schedule ov for pt (not just a qualifying walk) so a documented need for exertional 02 and benefit of poc can be documented.   RB's next available is 5/9- pt can be scheduled with NP.  lmtcb X1 for pt.

## 2016-12-26 ENCOUNTER — Other Ambulatory Visit: Payer: Self-pay | Admitting: Cardiology

## 2016-12-26 NOTE — Telephone Encounter (Signed)
Wife of patient returned phone call.[name]

## 2016-12-26 NOTE — Telephone Encounter (Signed)
Patient scheduled for 01/03/2017 at 10:15 with TP.Marland KitchenMarland KitchenMearl Latin

## 2016-12-26 NOTE — Telephone Encounter (Signed)
[facility] pt x 3. Line disconnected. When pt calls back they just need an OV with TP.

## 2016-12-26 NOTE — Telephone Encounter (Signed)
Noted. Nothing further needed. 

## 2016-12-30 ENCOUNTER — Telehealth: Payer: Self-pay | Admitting: *Deleted

## 2016-12-30 NOTE — Telephone Encounter (Signed)
SW pts wife about scheduling an AWE with [name] on 01/16/17 at 9:00am since pt has apt with Dr. Anitra Lauth at 8:00am that same day. Pts wife stated that she will have to talk to pt and will call back if they decided to schedule.

## 2017-01-03 ENCOUNTER — Ambulatory Visit (INDEPENDENT_AMBULATORY_CARE_PROVIDER_SITE_OTHER): Payer: Medicare Other | Admitting: Adult Health

## 2017-01-03 ENCOUNTER — Ambulatory Visit (INDEPENDENT_AMBULATORY_CARE_PROVIDER_SITE_OTHER)
Admission: RE | Admit: 2017-01-03 | Discharge: 2017-01-03 | Disposition: A | Discharge status: Home / Self Care | Payer: Medicare Other | Source: Ambulatory Visit | Attending: Adult Health | Admitting: Adult Health

## 2017-01-03 ENCOUNTER — Encounter: Payer: Self-pay | Admitting: Adult Health

## 2017-01-03 VITALS — BP 112/64 | HR 88 | Ht 68.0 in | Wt 189.8 lb

## 2017-01-03 DIAGNOSIS — J438 Other emphysema: Secondary | ICD-10-CM | POA: Diagnosis not present

## 2017-01-03 DIAGNOSIS — J449 Chronic obstructive pulmonary disease, unspecified: Secondary | ICD-10-CM | POA: Diagnosis not present

## 2017-01-03 DIAGNOSIS — I2510 Atherosclerotic heart disease of native coronary artery without angina pectoris: Secondary | ICD-10-CM

## 2017-01-03 DIAGNOSIS — J9611 Chronic respiratory failure with hypoxia: Secondary | ICD-10-CM

## 2017-01-03 NOTE — Progress Notes (Signed)
@Patient  ID: [name], male    DOB: 13-Aug-1936, 81 y.o.   MRN: [id number]  Chief Complaint  Patient presents with  . Follow-up    COPD     Referring provider: Tammi Sou, MD  HPI: 81 yo former smoker followed for severe COPD /O2 RF  Hx of A Fib   01/03/2017 Follow up : COPD /O2 RF  Pt returns for 4 month follow up . Says his breathing has been slowly getting worse over last 2 years.  Gets more winded with walking long distance, stairs or carrying heavy items.  Remains on Pulmicort and Stiolto .  Does have some cough , no flare of discolored mucus or fever.  Last cxr in 2016.  No chest pain, orthopnea, increased edema or fever.   On O2 2l/m . Wants a POC so it will be lighter and easier to carry.  O2 sats 88% on RA at rest , 84% walking , O2 sat 94% on 2l/m walking .   '  No Known Allergies  Immunization History  Administered Date(s) Administered  . Influenza Whole 06/05/2008, 09/28/2008, 07/31/2012  . Influenza, High Dose Seasonal PF 06/27/2013, 06/12/2014, 06/05/2015, 06/13/2016  . Pneumococcal Conjugate-13 12/03/2014  . Pneumococcal-Unspecified 09/19/2008  . Tdap 06/05/2015    Past Medical History:  Diagnosis Date  . Adrenal adenoma   . Atrial fibrillation (HCC)    Rate control + Coumadin  . Atrial flutter ([place])   . CAD (coronary artery disease)    Lexiscan Myoview (01/2014): Lateral soft tissue attenuation, no ischemia, not gated, low risk  . CAP (community acquired pneumonia)    Hospitalized 10/2014  . Chronic hypoxemic respiratory failure (HCC)    from COPD  . Chronic renal insufficiency, stage III (moderate)    CrCl 50s  . COPD (chronic obstructive pulmonary disease) (HCC)    oxygen-dependent (2.5 L 24/7)  . Diabetes mellitus without complication ([place]) 60/06/9322   A1c 6.5 % 05/2016  . Diverticulosis   . Dyslipidemia   . Dyspnea    Multifactorial: deconditioning, COPD, obesity hypoventilation syndrome.  . Embolic stroke ([place])    d/t  endocarditis 2008  . Hemorrhoids   . History of mitral valve replacement    needs SBE prophylaxis  . History of prostate cancer remote past   f/u by Dr. Claris Che; no recurrence as of f/u 10/2015.  Marland Kitchen HTN (hypertension)   . Mediastinal lymphadenopathy   . Pulmonary hypertension ([place]) 01/09/2015   secondary  . Right thyroid nodule   . Thrombocytopenia ([place])   . TIA (transient ischemic attack) 03/25/2011   ? of due to transient diplopia  . Ulcerative colitis    left-sided/segmental, associated with diverticulosis.  Sulfasalazine per GI (Dr. Carlean Purl).  . Warthin's tumor     Tobacco History: History  Smoking Status  . Former Smoker  . Packs/day: 3.00  . Years: 60.00  . Types: Cigarettes  . Quit date: 09/19/2002  Smokeless Tobacco  . Never Used   Counseling given: Not Answered   Outpatient Encounter Prescriptions as of 01/03/2017  Medication Sig  . albuterol (PROVENTIL HFA;VENTOLIN HFA) 108 (90 Base) MCG/ACT inhaler Inhale 1 puff into the lungs every 6 (six) hours as needed for wheezing or shortness of breath (using 5-6 times every 24 hrs).  . budesonide (PULMICORT) 0.5 MG/2ML nebulizer solution USE ONE VIAL IN NEBULIZER TWICE DAILY  . digoxin (LANOXIN) 0.125 MG tablet Take 1 tablet (0.125 mg total) by mouth daily.  Marland Kitchen diltiazem (CARDIZEM CD) 240 MG  24 hr capsule Take 1 capsule (240 mg total) by mouth daily.  . furosemide (LASIX) 40 MG tablet Take one (1) tablet by mouth alternating with half (1/2) tablet by mouth daily.  Marland Kitchen LORazepam (ATIVAN) 0.5 MG tablet Take 1 tablet (0.5 mg total) by mouth 2 (two) times daily as needed for anxiety.  Marland Kitchen olopatadine (PATANOL) 0.1 % ophthalmic solution Place 1 drop into both eyes 2 (two) times daily.  . pravastatin (PRAVACHOL) 40 MG tablet Take 1 tablet (40 mg total) by mouth every evening.  . Tiotropium Bromide-Olodaterol (STIOLTO RESPIMAT) 2.5-2.5 MCG/ACT AERS Inhale 2 puffs into the lungs daily.  Marland Kitchen warfarin (COUMADIN) 5 MG tablet Take 1.5 tablets  (7.5mg ) every Wednesday and 1 tablet (5mg ) all other days of the week.   No facility-administered encounter medications on file as of 01/03/2017.      Review of Systems  Constitutional:   No  weight loss, night sweats,  Fevers, chills,  +fatigue, or  lassitude.  HEENT:   No headaches,  Difficulty swallowing,  Tooth/dental problems, or  Sore throat,                No sneezing, itching, ear ache,  +nasal congestion, post nasal drip,   CV:  No chest pain,  Orthopnea, PND, swelling in lower extremities, anasarca, dizziness, palpitations, syncope.   GI  No heartburn, indigestion, abdominal pain, nausea, vomiting, diarrhea, change in bowel habits, loss of appetite, bloody stools.   Resp:    No chest wall deformity  Skin: no rash or lesions.  GU: no dysuria, change in color of urine, no urgency or frequency.  No flank pain, no hematuria   MS:  No joint pain or swelling.  No decreased range of motion.  No back pain.    Physical Exam  BP 112/64 (BP Location: Left Arm, Cuff Size: Normal)   Pulse 88   Ht 5\' 8"  (1.727 m)   Wt 189 lb 12.8 oz (86.1 kg)   SpO2 94%   BMI 28.86 kg/m   GEN: A/Ox3; pleasant , NAD, elderly    HEENT:  [place]/AT,  EACs-clear, TMs-wnl, NOSE-clear, THROAT-clear, no lesions, no postnasal drip or exudate noted.   NECK:  Supple w/ fair ROM; no JVD; normal carotid impulses w/o bruits; no thyromegaly or nodules palpated; no lymphadenopathy.    RESP  Decreased BS in bases ,  no accessory muscle use, no dullness to percussion  CARD:  RRR, no m/r/g, no peripheral edema, pulses intact, no cyanosis or clubbing.  GI:   Soft & nt; nml bowel sounds; no organomegaly or masses detected.   Musco: Warm bil, no deformities or joint swelling noted.   Neuro: alert, no focal deficits noted.    Skin: Warm, no lesions or rashes    Lab Results:  CBC  BNP   Imaging: Dg Chest 2 View  Result Date: 01/03/2017 CLINICAL DATA:  Routine follow-up study. No current  complaints. History of [place] OPD, coronary artery disease, mitral valve replacement, atrial fibrillation, pulmonary hypertension, former smoker. EXAM: CHEST  2 VIEW COMPARISON:  Chest x-ray of December 09, 2014 FINDINGS: The lungs remain mildly hyperinflated. The interstitial markings remain increased diffusely greatest in the mid and lower lung zones. The heart is normal in size. The pulmonary vascularity is not engorged. A prosthetic mitral valve ring is visible. The sternal wires are intact. There is calcification in the wall of the thoracic aorta. There is multilevel degenerative disc disease of the thoracic spine. IMPRESSION: COPD.  Chronic  fibrotic changes.  No acute pneumonia nor CHF. Thoracic aortic atherosclerosis. Electronically Signed   By: David  Martinique M.D.   On: 01/03/2017 11:12     Assessment & Plan:   COPD (chronic obstructive pulmonary disease) with emphysema Moderate COPD - check cxr today .  Advised to cont current regimen   Plan  Patient Instructions  Continue on Stiolto 2 puffs daily  Continue on Pulmicort Neb Twice daily  .  Mucinex DM Twice daily  As needed  Cough/congestion .  Continue on Oxygen 2l/m.  Order to DME for POC Oxygen device .  Chest xray today .  Follow up with Dr. Lamonte Sakai in 4 months and As needed      Chronic hypoxemic respiratory failure ([place]) Cont on O2  Order POC      Rexene Edison, NP 01/03/2017

## 2017-01-03 NOTE — Assessment & Plan Note (Signed)
Moderate COPD - check cxr today .  Advised to cont current regimen   Plan  Patient Instructions  Continue on Stiolto 2 puffs daily  Continue on Pulmicort Neb Twice daily  .  Mucinex DM Twice daily  As needed  Cough/congestion .  Continue on Oxygen 2l/m.  Order to DME for POC Oxygen device .  Chest xray today .  Follow up with Dr. Lamonte Sakai in 4 months and As needed

## 2017-01-03 NOTE — Assessment & Plan Note (Signed)
Cont on O2  Order POC

## 2017-01-03 NOTE — Patient Instructions (Addendum)
Continue on Stiolto 2 puffs daily  Continue on Pulmicort Neb Twice daily  .  Mucinex DM Twice daily  As needed  Cough/congestion .  Continue on Oxygen 2l/m.  Order to DME for POC Oxygen device .  Chest xray today .  Follow up with Dr. Lamonte Sakai in 4 months and As needed

## 2017-01-16 ENCOUNTER — Ambulatory Visit (INDEPENDENT_AMBULATORY_CARE_PROVIDER_SITE_OTHER): Payer: Medicare Other | Admitting: Family Medicine

## 2017-01-16 ENCOUNTER — Encounter: Payer: Self-pay | Admitting: Family Medicine

## 2017-01-16 VITALS — BP 129/78 | HR 76 | Temp 97.4°F | Resp 16 | Ht 68.0 in | Wt 191.2 lb

## 2017-01-16 DIAGNOSIS — E119 Type 2 diabetes mellitus without complications: Secondary | ICD-10-CM | POA: Diagnosis not present

## 2017-01-16 DIAGNOSIS — N183 Chronic kidney disease, stage 3 unspecified: Secondary | ICD-10-CM

## 2017-01-16 DIAGNOSIS — I10 Essential (primary) hypertension: Secondary | ICD-10-CM | POA: Diagnosis not present

## 2017-01-16 DIAGNOSIS — E7800 Pure hypercholesterolemia, unspecified: Secondary | ICD-10-CM | POA: Diagnosis not present

## 2017-01-16 DIAGNOSIS — F411 Generalized anxiety disorder: Secondary | ICD-10-CM | POA: Diagnosis not present

## 2017-01-16 DIAGNOSIS — I2510 Atherosclerotic heart disease of native coronary artery without angina pectoris: Secondary | ICD-10-CM | POA: Diagnosis not present

## 2017-01-16 LAB — BASIC METABOLIC PANEL
BUN: 14 mg/dL (ref 6–23)
CHLORIDE: 103 meq/L (ref 96–112)
CO2: 26 meq/L (ref 19–32)
Calcium: 9.7 mg/dL (ref 8.4–10.5)
Creatinine, Ser: 1.16 mg/dL (ref 0.40–1.50)
GFR: 64.54 mL/min (ref >60.00)
Glucose, Bld: 101 mg/dL — ABNORMAL HIGH (ref 70–99)
Potassium: 4.2 mEq/L (ref 3.5–5.1)
Sodium: 135 mEq/L (ref 135–145)

## 2017-01-16 LAB — MICROALBUMIN / CREATININE URINE RATIO
CREATININE, U: 65.2 mg/dL
MICROALB UR: 2.6 mg/dL — AB (ref 0.0–1.9)
Microalb Creat Ratio: 4 mg/g (ref 0.0–30.0)

## 2017-01-16 LAB — HEMOGLOBIN A1C: HEMOGLOBIN A1C: 6.1 % (ref 4.6–6.5)

## 2017-01-16 MED ORDER — LORAZEPAM 0.5 MG PO TABS: 0.5000 mg | ORAL_TABLET | Freq: Two times a day (BID) | ORAL | 5 refills | Status: DC | PRN

## 2017-01-16 NOTE — Progress Notes (Signed)
Pre visit review using our clinic review tool, if applicable. No additional management support is needed unless otherwise documented below in the visit note. 

## 2017-01-16 NOTE — Telephone Encounter (Signed)
Patient declines AWV.  

## 2017-01-16 NOTE — Progress Notes (Signed)
OFFICE VISIT  01/16/2017   CC:  Chief Complaint  Patient presents with  . Follow-up    RCI, pt is fasting.    HPI:    Patient is a 81 y.o.  male who presents for 3 mo f/u DM 2, HTN with CRI stage III, and Hyperlipidemia, anxiety.  DM 2:  Home monitoring 118 is an example he gives.  No checks later in day. Not eating diabetic diet.  Occ walking around, limited by pulm status/SOB--is on oxygen 2.5 L 24/7.  HTN: no home monitoring.    HLD: takes pravastatin daily, no side effects.  His son is an alcoholic.  This has Mr. Cahall very anxious this morning.  Apparently lots of problems came upon them yesterday. We talked some today about this.  Gave emotional support.  Past Medical History:  Diagnosis Date  . Adrenal adenoma   . Atrial fibrillation (HCC)    Rate control + Coumadin  . Atrial flutter ([place])   . CAD (coronary artery disease)    Lexiscan Myoview (01/2014): Lateral soft tissue attenuation, no ischemia, not gated, low risk  . CAP (community acquired pneumonia)    Hospitalized 10/2014  . Chronic hypoxemic respiratory failure (HCC)    from COPD  . Chronic renal insufficiency, stage III (moderate)    CrCl 50s  . COPD (chronic obstructive pulmonary disease) (HCC)    oxygen-dependent (2.5 L 24/7)  . Diabetes mellitus without complication ([place]) 78/29/5621   A1c 6.5 % 05/2016  . Diverticulosis   . Dyslipidemia   . Dyspnea    Multifactorial: deconditioning, COPD, obesity hypoventilation syndrome.  . Embolic stroke ([place])    d/t endocarditis 2008  . Hemorrhoids   . History of mitral valve replacement    needs SBE prophylaxis  . History of prostate cancer remote past   f/u by Dr. Claris Che; no recurrence as of f/u 10/2015.  Marland Kitchen HTN (hypertension)   . Mediastinal lymphadenopathy   . Pulmonary hypertension ([place]) 01/09/2015   secondary  . Right thyroid nodule   . Thrombocytopenia ([place])   . TIA (transient ischemic attack) 03/25/2011   ? of due to transient diplopia  .  Ulcerative colitis    left-sided/segmental, associated with diverticulosis.  Sulfasalazine per GI (Dr. Carlean Purl).  . Warthin's tumor     Past Surgical History:  Procedure Laterality Date  . carotid duplex dopplers  04/2014   No signif plaques; repeat 2 yrs per cardiology  . CATARACT EXTRACTION, BILATERAL    . COLONOSCOPY W/ BIOPSIES  12/19/2007   left-sided colitis, diverticulosis, hemorrhoids  . FLEXIBLE SIGMOIDOSCOPY  01/11/2008; 03/19/2010   2009 and 2011:segmental left colitis, diverticulosis, hemorrhoids  . INGUINAL HERNIA REPAIR     right  . MITRAL VALVE REPAIR  09/22/04   and modified Cox-Maze IV   . MITRAL VALVE REPAIR  09/14/05   redo MV repair d/t endocardiits/embolic events  . PROSTATECTOMY    . RIGHT HEART CATHETERIZATION N/A 01/01/2015   Procedure: RIGHT HEART CATH;  Surgeon: Peter M Martinique, MD;  Location: [facility] CATH LAB;  Service: Cardiovascular;  Laterality: N/A;  . SALIVARY GLAND SURGERY     left resection  . TRANSTHORACIC ECHOCARDIOGRAM  11/2013   Mod LVH, EF 55-60%, no WM abnormalities, +LA dilation, +severe RV dilation and impaired systolic fxn, +increased pulm art pressures.  Valves ok.    Outpatient Medications Prior to Visit  Medication Sig Dispense Refill  . albuterol (PROVENTIL HFA;VENTOLIN HFA) 108 (90 Base) MCG/ACT inhaler Inhale 1 puff into the lungs  every 6 (six) hours as needed for wheezing or shortness of breath (using 5-6 times every 24 hrs). 3 Inhaler 2  . budesonide (PULMICORT) 0.5 MG/2ML nebulizer solution USE ONE VIAL IN NEBULIZER TWICE DAILY 120 mL 5  . digoxin (LANOXIN) 0.125 MG tablet Take 1 tablet (0.125 mg total) by mouth daily. 90 tablet 3  . diltiazem (CARDIZEM CD) 240 MG 24 hr capsule Take 1 capsule (240 mg total) by mouth daily. 90 capsule 3  . furosemide (LASIX) 40 MG tablet Take one (1) tablet by mouth alternating with half (1/2) tablet by mouth daily. 90 tablet 3  . olopatadine (PATANOL) 0.1 % ophthalmic solution Place 1 drop into both eyes 2 (two)  times daily. 5 mL 12  . pravastatin (PRAVACHOL) 40 MG tablet Take 1 tablet (40 mg total) by mouth every evening. 90 tablet 2  . Tiotropium Bromide-Olodaterol (STIOLTO RESPIMAT) 2.5-2.5 MCG/ACT AERS Inhale 2 puffs into the lungs daily. 3 Inhaler 3  . warfarin (COUMADIN) 5 MG tablet Take 1.5 tablets (7.5mg ) every Wednesday and 1 tablet (5mg ) all other days of the week. 100 tablet 1  . LORazepam (ATIVAN) 0.5 MG tablet Take 1 tablet (0.5 mg total) by mouth 2 (two) times daily as needed for anxiety. 30 tablet 1   No facility-administered medications prior to visit.     No Known Allergies  ROS As per HPI  PE: Blood pressure 129/78, pulse 76, temperature 97.4 F (36.3 C), temperature source Oral, resp. rate 16, height 5\' 8"  (1.727 m), weight 191 lb 4 oz (86.8 kg), SpO2 93 %. Gen: Alert, well appearing.  Patient is oriented to person, place, time, and situation. AFFECT: pleasant, lucid thought and speech. No further exam today.  LABS:  Lab Results  Component Value Date   TSH 3.24 12/09/2014   Lab Results  Component Value Date   WBC 7.6 12/13/2016   HGB 13.1 (L) 12/13/2016   HCT 40.2 12/13/2016   MCV 89.9 12/13/2016   PLT 188 12/13/2016   Lab Results  Component Value Date   CREATININE 1.29 10/12/2016   BUN 13 10/12/2016   NA 138 10/12/2016   K 4.5 10/12/2016   CL 103 10/12/2016   CO2 28 10/12/2016   Lab Results  Component Value Date   ALT 18 10/12/2016   AST 15 10/12/2016   ALKPHOS 65 10/12/2016   BILITOT 1.1 10/12/2016   Lab Results  Component Value Date   CHOL 143 10/12/2016   Lab Results  Component Value Date   HDL 44.20 10/12/2016   Lab Results  Component Value Date   LDLCALC 79 10/12/2016   Lab Results  Component Value Date   TRIG 100.0 10/12/2016   Lab Results  Component Value Date   CHOLHDL 3 10/12/2016   Lab Results  Component Value Date   PSA 0.08 Test Methodology: Hybritech PSA (L) 12/18/2007   PSA 0.04 Test Methodology: Hybritech PSA (L)  11/24/2007   Lab Results  Component Value Date   HGBA1C 6.4 10/12/2016    IMPRESSION AND PLAN:  No problem-specific Assessment & Plan notes found for this encounter.  1) DM 2: not eating diabetic diet. HbA1c and urine microalbumin/cr today.  2) HTN; no home monitoring.  Compliant with meds.  3) HLD: tolerating statin.  Lipids 09/2016 excellent, as were AST/ALT.  4) CRI stage III: lytes/cr today.  Avoid NSAIDs.  Hydrate adequately.  5) Anxiety, situational: emotional support given. Continue prn ativan 0.5, 1/2-1 tab bid prn, new rx printed for pt  and handed to him today.  An After Visit Summary was printed and given to the patient.  FOLLOW UP: Return in about 3 months (around 04/17/2017) for routine chronic illness f/u.  Signed:  Crissie Sickles, MD           01/16/2017

## 2017-01-24 ENCOUNTER — Ambulatory Visit (INDEPENDENT_AMBULATORY_CARE_PROVIDER_SITE_OTHER): Payer: Medicare Other | Admitting: *Deleted

## 2017-01-24 DIAGNOSIS — Z5181 Encounter for therapeutic drug level monitoring: Secondary | ICD-10-CM | POA: Diagnosis not present

## 2017-01-24 DIAGNOSIS — I4892 Unspecified atrial flutter: Secondary | ICD-10-CM

## 2017-01-24 DIAGNOSIS — I482 Chronic atrial fibrillation: Secondary | ICD-10-CM | POA: Diagnosis not present

## 2017-01-24 DIAGNOSIS — Z8679 Personal history of other diseases of the circulatory system: Secondary | ICD-10-CM | POA: Diagnosis not present

## 2017-01-24 DIAGNOSIS — I4821 Permanent atrial fibrillation: Secondary | ICD-10-CM

## 2017-01-24 DIAGNOSIS — I2510 Atherosclerotic heart disease of native coronary artery without angina pectoris: Secondary | ICD-10-CM

## 2017-01-24 DIAGNOSIS — Z7901 Long term (current) use of anticoagulants: Secondary | ICD-10-CM

## 2017-01-24 LAB — POCT INR: INR: 2.2

## 2017-02-02 ENCOUNTER — Encounter (HOSPITAL_BASED_OUTPATIENT_CLINIC_OR_DEPARTMENT_OTHER): Payer: Self-pay

## 2017-02-02 ENCOUNTER — Emergency Department (HOSPITAL_BASED_OUTPATIENT_CLINIC_OR_DEPARTMENT_OTHER)
Admission: EM | Admit: 2017-02-02 | Discharge: 2017-02-03 | Disposition: A | Discharge status: Home / Self Care | Payer: Medicare Other | Attending: Emergency Medicine | Admitting: Emergency Medicine

## 2017-02-02 ENCOUNTER — Emergency Department (HOSPITAL_BASED_OUTPATIENT_CLINIC_OR_DEPARTMENT_OTHER): Payer: Medicare Other

## 2017-02-02 DIAGNOSIS — Z87891 Personal history of nicotine dependence: Secondary | ICD-10-CM | POA: Insufficient documentation

## 2017-02-02 DIAGNOSIS — N183 Chronic kidney disease, stage 3 (moderate): Secondary | ICD-10-CM | POA: Insufficient documentation

## 2017-02-02 DIAGNOSIS — Y939 Activity, unspecified: Secondary | ICD-10-CM | POA: Insufficient documentation

## 2017-02-02 DIAGNOSIS — J449 Chronic obstructive pulmonary disease, unspecified: Secondary | ICD-10-CM | POA: Insufficient documentation

## 2017-02-02 DIAGNOSIS — Y929 Unspecified place or not applicable: Secondary | ICD-10-CM | POA: Insufficient documentation

## 2017-02-02 DIAGNOSIS — Y999 Unspecified external cause status: Secondary | ICD-10-CM | POA: Insufficient documentation

## 2017-02-02 DIAGNOSIS — I2510 Atherosclerotic heart disease of native coronary artery without angina pectoris: Secondary | ICD-10-CM | POA: Insufficient documentation

## 2017-02-02 DIAGNOSIS — Z7901 Long term (current) use of anticoagulants: Secondary | ICD-10-CM | POA: Insufficient documentation

## 2017-02-02 DIAGNOSIS — W1642XA Fall into unspecified water causing other injury, initial encounter: Secondary | ICD-10-CM | POA: Diagnosis not present

## 2017-02-02 DIAGNOSIS — E1122 Type 2 diabetes mellitus with diabetic chronic kidney disease: Secondary | ICD-10-CM | POA: Insufficient documentation

## 2017-02-02 DIAGNOSIS — I129 Hypertensive chronic kidney disease with stage 1 through stage 4 chronic kidney disease, or unspecified chronic kidney disease: Secondary | ICD-10-CM | POA: Diagnosis not present

## 2017-02-02 DIAGNOSIS — S99912A Unspecified injury of left ankle, initial encounter: Secondary | ICD-10-CM | POA: Insufficient documentation

## 2017-02-02 DIAGNOSIS — S99922A Unspecified injury of left foot, initial encounter: Secondary | ICD-10-CM | POA: Diagnosis not present

## 2017-02-02 NOTE — ED Provider Notes (Signed)
[facility] DEPT [facility] Provider Note   CSN: [id number] Arrival date & time: 02/02/17  1933  By signing my name below, I, Collene Leyden, attest that this documentation has been prepared under the direction and in the presence of .Marland Kitchen Electronically Signed: Collene Leyden, Scribe. 02/02/17. 1:53 AM.  History   Chief Complaint Chief Complaint  Patient presents with  . Ankle Injury   HPI Comments: [name] is a 81 y.o. male with a history of atrial fibrillation, CAD, COPD, CVA, and cancer, who presents to the Emergency Department complaining of sudden-onset, constant left ankle pain that began earlier today. Patient states he fell in a pond, twisting his left ankle. Patient denies any previous injury to his ankle. Patient reports associated left ankle swelling and ecchymosis. No modifying factors indicated. Patient is unable to bear weight 2/t pain. Patient denies any numbness, weakness, fever, chills, nausea, or vomiting.   The history is provided by the patient. No language interpreter was used.    Past Medical History:  Diagnosis Date  . Adrenal adenoma   . Atrial fibrillation (HCC)    Rate control + Coumadin  . Atrial flutter ([place])   . CAD (coronary artery disease)    Lexiscan Myoview (01/2014): Lateral soft tissue attenuation, no ischemia, not gated, low risk  . CAP (community acquired pneumonia)    Hospitalized 10/2014  . Chronic hypoxemic respiratory failure (HCC)    from COPD  . Chronic renal insufficiency, stage III (moderate)    CrCl 50s  . COPD (chronic obstructive pulmonary disease) (HCC)    oxygen-dependent (2.5 L 24/7)  . Diabetes mellitus without complication ([place]) 95/05/3266   A1c 6.5 % 05/2016  . Diverticulosis   . Dyslipidemia   . Dyspnea    Multifactorial: deconditioning, COPD, obesity hypoventilation syndrome.  . Embolic stroke ([place])    d/t endocarditis 2008  . Hemorrhoids   . History of mitral valve replacement    needs SBE prophylaxis  . History of  prostate cancer remote past   f/u by Dr. Claris Che; no recurrence as of f/u 10/2015.  Marland Kitchen HTN (hypertension)   . Mediastinal lymphadenopathy   . Pulmonary hypertension ([place]) 01/09/2015   secondary  . Right thyroid nodule   . Thrombocytopenia ([place])   . TIA (transient ischemic attack) 03/25/2011   ? of due to transient diplopia  . Ulcerative colitis    left-sided/segmental, associated with diverticulosis.  Sulfasalazine per GI (Dr. Carlean Purl).  . Warthin's tumor     Patient Active Problem List   Diagnosis Date Noted  . Pruritus 09/02/2015  . Maxillary sinusitis 08/17/2015  . Eye irritation 08/17/2015  . Chronic hypoxemic respiratory failure ([place]) 07/17/2015  . Pulmonary hypertension ([place]) 01/09/2015  . Anxiety state 11/28/2014  . SOB (shortness of breath) 11/28/2014  . Cerebrovascular disease 11/20/2014  . Emotional lability 11/11/2014  . Tachycardia 11/04/2014  . Encounter for therapeutic drug monitoring 03/18/2014  . Atrial fibrillation ([place]) 07/25/2011  . Long term current use of anticoagulant 10/20/2010  . Essential hypertension, benign 08/20/2009  . HYPERLIPIDEMIA, FAMILIAL 08/03/2009  . Coronary atherosclerosis 08/03/2009  . MITRAL VALVE REPLACEMENT, HX OF 08/03/2009  . THYROID NODULE, RIGHT 12/03/2008  . CEREBROVASCULAR ACCIDENT, HX OF 12/03/2008  . COPD (chronic obstructive pulmonary disease) with emphysema ([place]) 06/09/2008  . Segmental colitis associated with sigmoid diverticulosis 12/12/2007  . PROSTATE CANCER, HX OF 01/12/2007  . ENDOCARDITIS 07/04/2006    Class: History of    Past Surgical History:  Procedure Laterality Date  . carotid duplex  dopplers  04/2014   No signif plaques; repeat 2 yrs per cardiology  . CATARACT EXTRACTION, BILATERAL    . COLONOSCOPY W/ BIOPSIES  12/19/2007   left-sided colitis, diverticulosis, hemorrhoids  . FLEXIBLE SIGMOIDOSCOPY  01/11/2008; 03/19/2010   2009 and 2011:segmental left colitis, diverticulosis, hemorrhoids  . INGUINAL HERNIA  REPAIR     right  . MITRAL VALVE REPAIR  09/22/04   and modified Cox-Maze IV   . MITRAL VALVE REPAIR  09/14/05   redo MV repair d/t endocardiits/embolic events  . PROSTATECTOMY    . RIGHT HEART CATHETERIZATION N/A 01/01/2015   Procedure: RIGHT HEART CATH;  Surgeon: Peter M Martinique, MD;  Location: [facility] C[facility] LAB;  Service: Cardiovascular;  Laterality: N/A;  . SALIVARY GLAND SURGERY     left resection  . TRANSTHORACIC ECHOCARDIOGRAM  11/2013   Mod LVH, EF 55-60%, no WM abnormalities, +LA dilation, +severe RV dilation and impaired systolic fxn, +increased pulm art pressures.  Valves ok.       Home Medications    Prior to Admission medications   Medication Sig Start Date End Date Taking? Authorizing Provider  albuterol (PROVENTIL HFA;VENTOLIN HFA) 108 (90 Base) MCG/ACT inhaler Inhale 1 puff into the lungs every 6 (six) hours as needed for wheezing or shortness of breath (using 5-6 times every 24 hrs). 07/19/16   Collene Gobble, MD  budesonide (PULMICORT) 0.5 MG/2ML nebulizer solution USE ONE VIAL IN NEBULIZER TWICE DAILY 08/08/16   Collene Gobble, MD  digoxin (LANOXIN) 0.125 MG tablet Take 1 tablet (0.125 mg total) by mouth daily. 12/13/16   Lelon Perla, MD  diltiazem (CARDIZEM CD) 240 MG 24 hr capsule Take 1 capsule (240 mg total) by mouth daily. 12/13/16   Lelon Perla, MD  furosemide (LASIX) 40 MG tablet Take one (1) tablet by mouth alternating with half (1/2) tablet by mouth daily. 12/19/16   Lelon Perla, MD  LORazepam (ATIVAN) 0.5 MG tablet Take 1 tablet (0.5 mg total) by mouth 2 (two) times daily as needed for anxiety. 01/16/17   McGowen, Adrian [place], MD  olopatadine (PATANOL) 0.1 % ophthalmic solution Place 1 drop into both eyes 2 (two) times daily. 08/17/15   Kuneff, Renee A, DO  pravastatin (PRAVACHOL) 40 MG tablet Take 1 tablet (40 mg total) by mouth every evening. 12/13/16   Lelon Perla, MD  Tiotropium Bromide-Olodaterol (STIOLTO RESPIMAT) 2.5-2.5 MCG/ACT AERS Inhale 2  puffs into the lungs daily. 08/08/16   Collene Gobble, MD  warfarin (COUMADIN) 5 MG tablet Take 1.5 tablets (7.5mg ) every Wednesday and 1 tablet (5mg ) all other days of the week. 12/27/16   Lelon Perla, MD    Family History Family History  Problem Relation Age of Onset  . Cancer Mother        spinal  . Stroke Father   . Heart disease Father   . Colon cancer Neg Hx   . Heart attack Neg Hx     Social History Social History  Substance Use Topics  . Smoking status: Former Smoker    Packs/day: 3.00    Years: 60.00    Types: Cigarettes    Quit date: 09/19/2002  . Smokeless tobacco: Never Used  . Alcohol use No     Allergies   Patient has no known allergies.   Review of Systems Review of Systems  Constitutional: Negative for chills and fever.  Gastrointestinal: Negative for nausea and vomiting.  Musculoskeletal: Positive for arthralgias (left ankle), gait problem and joint  swelling (left ankle).  Skin: Positive for color change.  Neurological: Negative for weakness and numbness.     Physical Exam Updated Vital Signs BP 140/78 (BP Location: Left Arm)   Pulse 79   Temp 97.6 F (36.4 C) (Oral)   Resp 19   Ht 5\' 8"  (1.727 m)   Wt 191 lb (86.6 kg)   SpO2 97%   BMI 29.04 kg/m   Physical Exam  Constitutional: He appears well-developed and well-nourished.  HENT:  Head: Normocephalic and atraumatic.  Mouth/Throat: Oropharynx is clear and moist.  Eyes: Conjunctivae and EOM are normal. Pupils are equal, round, and reactive to light.  Neck: Normal range of motion. Neck supple.  Cardiovascular: Normal rate and intact distal pulses.   Pulmonary/Chest: Effort normal.  Musculoskeletal: Normal range of motion. He exhibits edema and tenderness. He exhibits no deformity.  Left lateral ankle and dorsal foot with moderate edema and minimal ecchymosis. TTP over anterior  Tibiofibular ligament and dorsal foot. Ankle is stable. Normal ROM. Pt with difficulty bearing weight  secondary to pain.    Neurological: He is alert. No sensory deficit.  Sensation intact.  Psychiatric: He has a normal mood and affect. His behavior is normal.  Nursing note and vitals reviewed.    ED Treatments / Results  DIAGNOSTIC STUDIES: Oxygen Saturation is 94% on RA, adequate by my interpretation.    COORDINATION OF CARE: 11:22 PM Discussed treatment plan with pt at bedside and pt agreed to plan, which includes crutches and ibuprofen.   Labs (all labs ordered are listed, but only abnormal results are displayed) Labs Reviewed - No data to display  EKG  EKG Interpretation None       Radiology Dg Ankle Complete Left  Result Date: 02/02/2017 CLINICAL DATA:  Inversion ankle injury. EXAM: LEFT ANKLE COMPLETE - 3+ VIEW COMPARISON:  None. FINDINGS: No fracture.  No bone lesion. The ankle mortise is normally spaced and aligned. No arthropathic change. Minor lateral soft tissue swelling. There are dense arterial vascular calcifications. IMPRESSION: 1. No fracture or dislocation. Electronically Signed   By: Lajean Manes M.D.   On: 02/02/2017 20:16   Dg Foot Complete Left  Result Date: 02/02/2017 CLINICAL DATA:  Slipped into a pond which caused his left foot and ankle to roll over; unable to put very much weight on it; happened earlier this evening EXAM: LEFT FOOT - COMPLETE 3+ VIEW COMPARISON:  None. FINDINGS: No fracture.  No bone lesion. The joints are normally spaced and aligned. No significant arthropathic change. Small plantar calcaneal spur. There are dense arterial vascular calcifications. Soft tissues are otherwise unremarkable. IMPRESSION: 1. No fracture, dislocation or acute finding. Electronically Signed   By: Lajean Manes M.D.   On: 02/02/2017 20:15    Procedures Procedures (including critical care time)  Medications Ordered in ED Medications - No data to display   Initial Impression / Assessment and Plan / ED Course  I have reviewed the triage vital signs and the  nursing notes.  Pertinent labs & imaging results that were available during my care of the patient were reviewed by me and considered in my medical decision making (see chart for details).     Patient with left ankle injury, suspect strain versus sprain. X-ray without acute fracture or dislocation. Ankle is stable, NV intact, TTP over anterior tibiofibular ligament. Patient with difficulty bearing weight secondary to pain. Placed in a cam walking boot. symptomatic management for pain, RICE therapy. Patient to follow-up with his orthopedic provider.  Patient appears safe prior to discharge.  Patient discussed with and seen by Dr. Sherry Ruffing. Discussed results, findings, treatment and follow up. Patient advised of return precautions. Patient verbalized understanding and agreed with plan.   Final Clinical Impressions(s) / ED Diagnoses   Final diagnoses:  Left ankle injury, initial encounter    New Prescriptions Discharge Medication List as of 02/02/2017 11:53 PM     I personally performed the services described in this documentation, which was scribed in my presence. The recorded information has been reviewed and is accurate.     Russo, Martinique N, PA-C 02/03/17 0153    Tegeler, Gwenyth Allegra, MD 02/03/17 1159

## 2017-02-02 NOTE — ED Triage Notes (Signed)
Pt fell in the pond today and twisted left ankle, cannot bear weight, swelling to ankle noted, no meds prior to arrival

## 2017-02-02 NOTE — Discharge Instructions (Signed)
Please read instructions below. Where your boot whenever walking. Apply ice to your ankle for 20 minutes at a time Elevate your foot whenever possible. Schedule an appointment with your Orthopedic provider in 3-5 days. You can take tylenol as needed for pain. Return to the ER for numbness/tingling in your foot, or other concerning symptoms.

## 2017-02-02 NOTE — ED Notes (Signed)
ED Provider at bedside. 

## 2017-02-20 ENCOUNTER — Encounter: Payer: Self-pay | Admitting: Emergency Medicine

## 2017-02-20 ENCOUNTER — Ambulatory Visit (INDEPENDENT_AMBULATORY_CARE_PROVIDER_SITE_OTHER): Payer: Medicare Other | Admitting: Emergency Medicine

## 2017-02-20 DIAGNOSIS — J438 Other emphysema: Secondary | ICD-10-CM

## 2017-02-20 DIAGNOSIS — I2510 Atherosclerotic heart disease of native coronary artery without angina pectoris: Secondary | ICD-10-CM

## 2017-02-20 DIAGNOSIS — J9611 Chronic respiratory failure with hypoxia: Secondary | ICD-10-CM

## 2017-02-20 NOTE — Patient Instructions (Addendum)
Please continue your Stiolto once a day Take albuterol 2 puffs up to every 4 hours if needed for shortness of breath.  Please start mucinex 600mg  twice a day for 2 weeks to see if this helps cough and breathing Continue your oxygen at 2-2.5L/min  We will revisit a portable oxygen concentrator in January '19 Flu shot in the Fall Use nasal saline to moisten your nose.  Follow with Dr Lamonte Sakai in 4 months or sooner if you have any problems.

## 2017-02-20 NOTE — Assessment & Plan Note (Signed)
Please continue your Stiolto once a day Take albuterol 2 puffs up to every 4 hours if needed for shortness of breath.  Please start mucinex 600mg  twice a day for 2 weeks to see if this helps cough and breathing Continue your oxygen at 2-2.5L/min  We will revisit a portable oxygen concentrator in January '19 Flu shot in the Fall Use nasal saline to moisten your nose.  Follow with Dr Lamonte Sakai in 4 months or sooner if you have any problems.

## 2017-02-20 NOTE — Progress Notes (Signed)
Subjective:    Patient ID: [name], male    DOB: 1935/09/29, 81 y.o.   MRN: [id number]  HPI 81 year old man, former smoker (90 pack years), with a history of mitral valve repair, atrial fibrillation (status post Maze procedure), coronary artery disease, history of stroke. He has been followed in our office by Dr Gwenette Greet for COPD and chronic hypoxemic respiratory failure. He has been managed on budesonide nebulized twice a day, Xopenex nebulized 4 times a day on schedule. He feels that these help him. He uses albuterol HFA 3-4x a day. Wakes up at night once to take SABA. He was treated for an AE in January '16. He does not wheeze. He is limited activity, wants to be able to go fishing. He is wearing 2L/min at all time.   ROV 07/17/15 -- follow-up visit for dyspnea in the setting of severe COPD and chronic hypoxemic respiratory failure. He also has a history of atrial fibrillation, coronary artery disease, mitral valve repair. At our last visit we did a trial of replacing scheduled nebulized Xopenex with Stiolto. He believes that his breathing is close to the same, but he does like the fact that he only has to take it once a day. He tells me that he occasionally goes without his O2 with exertion. Uses ventolin about tid.   ROV 01/05/16 -- patient with a history of chronic hypoxemic respiratory failure in the setting of severe COPD, atrial fibrillation, coronary artery disease. He has also had a mitral valve repair in the past. He is currently managed on desonide nebulizer twice a day, Stiolto daily. He uses oxygen at all times at 2 L/m.   He had been stable until about 2-3 weeks ago when he began to experience more exertional SOB. No real increase in cough, clears secretions most of the day. No change in mucous production .  He does have wheeze when he is laying in bed. He is using albuterol HFA frequently through the day, with any exertion. Thick sputum. He will try to exert without O2, then realizes  he misses it.   ROV 04/12/16 -- patient follows up for chronic hypoxemic respiratory failure with a history of severe COPD, atrial fibrillation, mitral valve repair and coronary artery disease. At our last visit we stressed good compliance with his oxygen on exertion, ensured that he is using continuous flow instead of pulse flow. He tells me that he feels about the same - limited exertion, has to stop after about 50 ft. He has not been very reliable with the O2 w exertion. He has an oximeter - has seen SpO2 below 90 on occasion. Using 2L/min continuous when he does wear it. He is using Stiolto qd, he is taking budesonide qam (not BID). He is using SABA freq through the day. He doesn't recall whether he started mucinex last time. He coughs daily, about 4-5x a day.   ROV 08/08/16 - severe COPD with associated hypoxemic resp failure. Also hx A Fib, MVR, CAD. On stiolto + budesonide nebs, usually once a day. He feels that his breathing is about the same as last year. He has been taking breaks from his oxygen with some exertion, going to restaurants. He denies wheeze, occasional cough. He is on stiolto reliably. He uses albuterol about once a day. No flares since last time. Flu shot up to date.   ROV 02/20/17 -- patient has a history of moderate-to-severe COPD with associated hypoxemic respiratory failure. Last seen in our office in  April. He had a fall mid May while he was fishing, hurt his ankle, now improved. He reports that his breathing has been worse, slowly over the last several months. He believes that humidity bothers him as well. He is irritated by the O2 in his nose, no lesions or scabs. He did not get a POC, he says because medicare won;t pay for it. O2 set on 2-2.5L/min. He has seen desaturations with exertion, to 88%. He is on stiolto, uses SABA 2-3x a day. Some cough, daily, prod of clear to yellow. No wheezing.    No flowsheet data found.  Review of Systems As per HPI     Objective:    Physical Exam Vitals:   02/20/17 0901  BP: 128/84  Pulse: 100  SpO2: 93%  Weight: 189 lb 3.2 oz (85.8 kg)  Height: 5\' 8"  (1.727 m)   Gen: Pleasant, elderly gentleman, in no distress,  normal affect on 2 L/m  ENT: No lesions,  mouth clear,  oropharynx clear, no postnasal drip  Neck: No JVD, no stridor  Lungs: No use of accessory muscles, clear without rales or rhonchi, no wheeze on forced expiration  Cardiovascular: distant, irregular, no murmur or gallops, no peripheral edema  Musculoskeletal: No deformities, no cyanosis or clubbing  Neuro: alert, non focal  Skin: Warm, bruising on both deltoids      Assessment & Plan:  COPD (chronic obstructive pulmonary disease) with emphysema Please continue your Stiolto once a day Take albuterol 2 puffs up to every 4 hours if needed for shortness of breath.  Please start mucinex 600mg  twice a day for 2 weeks to see if this helps cough and breathing Continue your oxygen at 2-2.5L/min  We will revisit a portable oxygen concentrator in January '19 Flu shot in the Fall Use nasal saline to moisten your nose.  Follow with Dr Lamonte Sakai in 4 months or sooner if you have any problems.  Chronic hypoxemic respiratory failure ([place]) He reports that he was unable to get a poor blocks and concentrator due to failure of Medicare to pay. He was told that this may change in January 2019.  Baltazar Apo, MD, PhD 02/20/2017, 9:19 AM [facility] [phone] or if no answer [phone]

## 2017-02-20 NOTE — Assessment & Plan Note (Signed)
He reports that he was unable to get a poor blocks and concentrator due to failure of Medicare to pay. He was told that this may change in January 2019.

## 2017-03-01 DIAGNOSIS — N5201 Erectile dysfunction due to arterial insufficiency: Secondary | ICD-10-CM | POA: Diagnosis not present

## 2017-03-01 DIAGNOSIS — R9721 Rising PSA following treatment for malignant neoplasm of prostate: Secondary | ICD-10-CM | POA: Diagnosis not present

## 2017-03-01 DIAGNOSIS — C61 Malignant neoplasm of prostate: Secondary | ICD-10-CM | POA: Diagnosis not present

## 2017-03-06 ENCOUNTER — Encounter: Payer: Self-pay | Admitting: Family Medicine

## 2017-03-07 ENCOUNTER — Ambulatory Visit (INDEPENDENT_AMBULATORY_CARE_PROVIDER_SITE_OTHER): Payer: Medicare Other | Admitting: *Deleted

## 2017-03-07 DIAGNOSIS — I482 Chronic atrial fibrillation: Secondary | ICD-10-CM | POA: Diagnosis not present

## 2017-03-07 DIAGNOSIS — I4892 Unspecified atrial flutter: Secondary | ICD-10-CM | POA: Diagnosis not present

## 2017-03-07 DIAGNOSIS — Z5181 Encounter for therapeutic drug level monitoring: Secondary | ICD-10-CM

## 2017-03-07 DIAGNOSIS — Z7901 Long term (current) use of anticoagulants: Secondary | ICD-10-CM

## 2017-03-07 DIAGNOSIS — Z8679 Personal history of other diseases of the circulatory system: Secondary | ICD-10-CM

## 2017-03-07 DIAGNOSIS — I4821 Permanent atrial fibrillation: Secondary | ICD-10-CM

## 2017-03-07 DIAGNOSIS — I2510 Atherosclerotic heart disease of native coronary artery without angina pectoris: Secondary | ICD-10-CM

## 2017-03-07 LAB — POCT INR: INR: 1.7

## 2017-03-21 DIAGNOSIS — H578 Other specified disorders of eye and adnexa: Secondary | ICD-10-CM | POA: Diagnosis not present

## 2017-03-21 DIAGNOSIS — T1512XA Foreign body in conjunctival sac, left eye, initial encounter: Secondary | ICD-10-CM | POA: Diagnosis not present

## 2017-04-04 ENCOUNTER — Ambulatory Visit (INDEPENDENT_AMBULATORY_CARE_PROVIDER_SITE_OTHER): Payer: Medicare Other | Admitting: *Deleted

## 2017-04-04 DIAGNOSIS — Z8679 Personal history of other diseases of the circulatory system: Secondary | ICD-10-CM

## 2017-04-04 DIAGNOSIS — I4892 Unspecified atrial flutter: Secondary | ICD-10-CM | POA: Diagnosis not present

## 2017-04-04 DIAGNOSIS — I4821 Permanent atrial fibrillation: Secondary | ICD-10-CM

## 2017-04-04 DIAGNOSIS — I482 Chronic atrial fibrillation: Secondary | ICD-10-CM | POA: Diagnosis not present

## 2017-04-04 DIAGNOSIS — I2510 Atherosclerotic heart disease of native coronary artery without angina pectoris: Secondary | ICD-10-CM

## 2017-04-04 DIAGNOSIS — Z7901 Long term (current) use of anticoagulants: Secondary | ICD-10-CM | POA: Diagnosis not present

## 2017-04-04 DIAGNOSIS — Z5181 Encounter for therapeutic drug level monitoring: Secondary | ICD-10-CM | POA: Diagnosis not present

## 2017-04-04 LAB — POCT INR: INR: 2

## 2017-04-14 ENCOUNTER — Other Ambulatory Visit: Payer: Self-pay

## 2017-04-17 ENCOUNTER — Encounter: Payer: Self-pay | Admitting: Family Medicine

## 2017-04-17 ENCOUNTER — Ambulatory Visit (INDEPENDENT_AMBULATORY_CARE_PROVIDER_SITE_OTHER): Payer: Medicare Other | Admitting: Family Medicine

## 2017-04-17 VITALS — BP 124/66 | HR 78 | Temp 97.4°F | Resp 16 | Ht 68.0 in | Wt 187.8 lb

## 2017-04-17 DIAGNOSIS — N183 Chronic kidney disease, stage 3 unspecified: Secondary | ICD-10-CM

## 2017-04-17 DIAGNOSIS — I2510 Atherosclerotic heart disease of native coronary artery without angina pectoris: Secondary | ICD-10-CM

## 2017-04-17 DIAGNOSIS — E119 Type 2 diabetes mellitus without complications: Secondary | ICD-10-CM | POA: Diagnosis not present

## 2017-04-17 DIAGNOSIS — I10 Essential (primary) hypertension: Secondary | ICD-10-CM | POA: Diagnosis not present

## 2017-04-17 DIAGNOSIS — I482 Chronic atrial fibrillation, unspecified: Secondary | ICD-10-CM

## 2017-04-17 LAB — HEMOGLOBIN A1C: Hgb A1c MFr Bld: 6.2 % (ref 4.6–6.5)

## 2017-04-17 LAB — BASIC METABOLIC PANEL
BUN: 15 mg/dL (ref 6–23)
CO2: 27 mEq/L (ref 19–32)
Calcium: 9.3 mg/dL (ref 8.4–10.5)
Chloride: 103 mEq/L (ref 96–112)
Creatinine, Ser: 1.16 mg/dL (ref 0.40–1.50)
GFR: 64.49 mL/min (ref >60.00)
Glucose, Bld: 102 mg/dL — ABNORMAL HIGH (ref 70–99)
Potassium: 4.5 mEq/L (ref 3.5–5.1)
Sodium: 136 mEq/L (ref 135–145)

## 2017-04-17 NOTE — Progress Notes (Signed)
OFFICE VISIT  04/17/2017   CC:  Chief Complaint  Patient presents with  . Follow-up    RCI, pt is fasting.    HPI:    Patient is a 81 y.o. Caucasian male who presents for 3 mo f/u CRI stage III, DM 2, HLD, HTN.  DM 2: only one check he can recall in last couple weeks: 105 fasting.  HTN: occ home bp check "like it is today".  HLD: he wonders if his arthralgias are worse on statin.  We discussed a 2 week trial off of this med today to see if any changes.  Past Medical History:  Diagnosis Date  . Adrenal adenoma   . Atrial fibrillation (HCC)    Rate control + Coumadin  . Atrial flutter ([place])   . CAD (coronary artery disease)    Lexiscan Myoview (01/2014): Lateral soft tissue attenuation, no ischemia, not gated, low risk  . CAP (community acquired pneumonia)    Hospitalized 10/2014  . Chronic hypoxemic respiratory failure (HCC)    from COPD  . Chronic renal insufficiency, stage III (moderate)    CrCl 50s  . COPD (chronic obstructive pulmonary disease) (HCC)    oxygen-dependent (2.5 L 24/7)  . Diabetes mellitus without complication ([place]) 46/96/2952   A1c 6.5 % 05/2016  . Diverticulosis   . Dyslipidemia   . Dyspnea    Multifactorial: deconditioning, COPD, obesity hypoventilation syndrome.  . Embolic stroke ([place])    d/t endocarditis 2008  . Hemorrhoids   . History of mitral valve replacement    needs SBE prophylaxis  . History of prostate cancer remote past   f/u by Dr. Claris Che; no recurrence as of f/u 10/2015.  PSA rising from 2013 to 06/2016: as of 02/2017 plan is bone scan (CT if PSA gets above 5).  Marland Kitchen HTN (hypertension)   . Mediastinal lymphadenopathy   . Pulmonary hypertension ([place]) 01/09/2015   secondary  . Right thyroid nodule   . Thrombocytopenia ([place])   . TIA (transient ischemic attack) 03/25/2011   ? of due to transient diplopia  . Ulcerative colitis    left-sided/segmental, associated with diverticulosis.  Sulfasalazine per GI (Dr. Carlean Purl).  . Warthin's tumor      Past Surgical History:  Procedure Laterality Date  . carotid duplex dopplers  04/2014   No signif plaques; repeat 2 yrs per cardiology  . CATARACT EXTRACTION, BILATERAL    . COLONOSCOPY W/ BIOPSIES  12/19/2007   left-sided colitis, diverticulosis, hemorrhoids  . FLEXIBLE SIGMOIDOSCOPY  01/11/2008; 03/19/2010   2009 and 2011:segmental left colitis, diverticulosis, hemorrhoids  . INGUINAL HERNIA REPAIR     right  . MITRAL VALVE REPAIR  09/22/04   and modified Cox-Maze IV   . MITRAL VALVE REPAIR  09/14/05   redo MV repair d/t endocardiits/embolic events  . PROSTATECTOMY  06/2003   Radical  . RIGHT HEART CATHETERIZATION N/A 01/01/2015   Procedure: RIGHT HEART CATH;  Surgeon: Peter M Martinique, MD;  Location: [facility] CATH LAB;  Service: Cardiovascular;  Laterality: N/A;  . SALIVARY GLAND SURGERY     left resection  . TRANSTHORACIC ECHOCARDIOGRAM  11/2013   Mod LVH, EF 55-60%, no WM abnormalities, +LA dilation, +severe RV dilation and impaired systolic fxn, +increased pulm art pressures.  Valves ok.    Outpatient Medications Prior to Visit  Medication Sig Dispense Refill  . albuterol (PROVENTIL HFA;VENTOLIN HFA) 108 (90 Base) MCG/ACT inhaler Inhale 1 puff into the lungs every 6 (six) hours as needed for wheezing or shortness of  breath (using 5-6 times every 24 hrs). 3 Inhaler 2  . budesonide (PULMICORT) 0.5 MG/2ML nebulizer solution USE ONE VIAL IN NEBULIZER TWICE DAILY 120 mL 5  . digoxin (LANOXIN) 0.125 MG tablet Take 1 tablet (0.125 mg total) by mouth daily. 90 tablet 3  . diltiazem (CARDIZEM CD) 240 MG 24 hr capsule Take 1 capsule (240 mg total) by mouth daily. 90 capsule 3  . furosemide (LASIX) 40 MG tablet Take one (1) tablet by mouth alternating with half (1/2) tablet by mouth daily. 90 tablet 3  . LORazepam (ATIVAN) 0.5 MG tablet Take 1 tablet (0.5 mg total) by mouth 2 (two) times daily as needed for anxiety. 30 tablet 5  . olopatadine (PATANOL) 0.1 % ophthalmic solution Place 1 drop into both  eyes 2 (two) times daily. 5 mL 12  . pravastatin (PRAVACHOL) 40 MG tablet Take 1 tablet (40 mg total) by mouth every evening. 90 tablet 2  . Tiotropium Bromide-Olodaterol (STIOLTO RESPIMAT) 2.5-2.5 MCG/ACT AERS Inhale 2 puffs into the lungs daily. 3 Inhaler 3  . warfarin (COUMADIN) 5 MG tablet Take 1.5 tablets (7.5mg ) every Wednesday and 1 tablet (5mg ) all other days of the week. 100 tablet 1   No facility-administered medications prior to visit.     No Known Allergies  ROS As per HPI  PE: Blood pressure 124/66, pulse 78, temperature (!) 97.4 F (36.3 C), temperature source Oral, resp. rate 16, height 5\' 8"  (1.727 m), weight 187 lb 12 oz (85.2 kg), SpO2 95 %. Gen: Alert, well appearing.  Patient is oriented to person, place, time, and situation. AFFECT: pleasant, lucid thought and speech. CV: irreg irreg, rate 70s, no m/r Chest is clear, no wheezing or rales. Normal symmetric air entry throughout both lung fields. No chest wall deformities or tenderness. EXT: no clubbing, cyanosis, or edema.    LABS:  Lab Results  Component Value Date   TSH 3.24 12/09/2014   Lab Results  Component Value Date   WBC 7.6 12/13/2016   HGB 13.1 (L) 12/13/2016   HCT 40.2 12/13/2016   MCV 89.9 12/13/2016   PLT 188 12/13/2016   Lab Results  Component Value Date   CREATININE 1.16 01/16/2017   BUN 14 01/16/2017   NA 135 01/16/2017   K 4.2 01/16/2017   CL 103 01/16/2017   CO2 26 01/16/2017   Lab Results  Component Value Date   ALT 18 10/12/2016   AST 15 10/12/2016   ALKPHOS 65 10/12/2016   BILITOT 1.1 10/12/2016   Lab Results  Component Value Date   CHOL 143 10/12/2016   Lab Results  Component Value Date   HDL 44.20 10/12/2016   Lab Results  Component Value Date   LDLCALC 79 10/12/2016   Lab Results  Component Value Date   TRIG 100.0 10/12/2016   Lab Results  Component Value Date   CHOLHDL 3 10/12/2016   Lab Results  Component Value Date   PSA 0.08 Test Methodology:  Hybritech PSA (L) 12/18/2007   PSA 0.04 Test Methodology: Hybritech PSA (L) 11/24/2007   Lab Results  Component Value Date   HGBA1C 6.1 01/16/2017    IMPRESSION AND PLAN:  1) DM 2:  HbA1c today.    2) HTN: The current medical regimen is effective;  continue present plan and medications. Lytes/cr today.  3) HLD: compliant with statin but possible arthralgia side effect suspected by pt. Try 2 weeks off statin--discussed method with pt today. Due for recheck FLP and AST/ALT in 6  mo.  4) CRI stage II: lytes/cr today.  An After Visit Summary was printed and given to the patient.  FOLLOW UP: Return in about 3 months (around 07/18/2017) for routine chronic illness f/u.  Signed:  Crissie Sickles, MD           04/17/2017

## 2017-05-02 ENCOUNTER — Ambulatory Visit (INDEPENDENT_AMBULATORY_CARE_PROVIDER_SITE_OTHER): Payer: Medicare Other | Admitting: Pharmacist

## 2017-05-02 DIAGNOSIS — I2510 Atherosclerotic heart disease of native coronary artery without angina pectoris: Secondary | ICD-10-CM

## 2017-05-02 DIAGNOSIS — I4892 Unspecified atrial flutter: Secondary | ICD-10-CM

## 2017-05-02 DIAGNOSIS — Z8679 Personal history of other diseases of the circulatory system: Secondary | ICD-10-CM | POA: Diagnosis not present

## 2017-05-02 DIAGNOSIS — I482 Chronic atrial fibrillation: Secondary | ICD-10-CM

## 2017-05-02 DIAGNOSIS — Z7901 Long term (current) use of anticoagulants: Secondary | ICD-10-CM

## 2017-05-02 DIAGNOSIS — Z5181 Encounter for therapeutic drug level monitoring: Secondary | ICD-10-CM

## 2017-05-02 DIAGNOSIS — I4821 Permanent atrial fibrillation: Secondary | ICD-10-CM

## 2017-05-02 LAB — POCT INR: INR: 2.1

## 2017-05-12 ENCOUNTER — Other Ambulatory Visit: Payer: Self-pay | Admitting: Cardiology

## 2017-05-15 ENCOUNTER — Ambulatory Visit: Payer: Medicare Other | Admitting: Emergency Medicine

## 2017-05-18 ENCOUNTER — Encounter: Payer: Self-pay | Admitting: Emergency Medicine

## 2017-05-18 ENCOUNTER — Ambulatory Visit (INDEPENDENT_AMBULATORY_CARE_PROVIDER_SITE_OTHER): Payer: Medicare Other | Admitting: Emergency Medicine

## 2017-05-18 DIAGNOSIS — I2510 Atherosclerotic heart disease of native coronary artery without angina pectoris: Secondary | ICD-10-CM

## 2017-05-18 DIAGNOSIS — J438 Other emphysema: Secondary | ICD-10-CM | POA: Diagnosis not present

## 2017-05-18 MED ORDER — PREDNISONE 20 MG PO TABS: 20.0000 mg | ORAL_TABLET | Freq: Every day | ORAL | 5 refills | Status: DC

## 2017-05-18 NOTE — Addendum Note (Signed)
Addended by: Desmond Dike C on: 05/18/2017 11:15 AM   Modules accepted: Orders

## 2017-05-18 NOTE — Assessment & Plan Note (Signed)
Progressively worse functional capacity. Hypoxemic respiratory failure. He is on maximal bronchodilator therapy, inhaled corticosteroid. I believe at this point be reasonable to try prednisone. If he benefits I will taper to the lowest effective dose.

## 2017-05-18 NOTE — Progress Notes (Signed)
   Subjective:    Patient ID: [name], male    DOB: [birth year], 81 y.o.   MRN: [id number]  HPI 81 year old man, former smoker (90 pack years), with a history of mitral valve repair, atrial fibrillation (status post Maze procedure), coronary artery disease, history of stroke. He has been followed in our office by Dr Gwenette Greet for COPD and chronic hypoxemic respiratory failure. He has been managed on budesonide nebulized twice a day, Xopenex nebulized 4 times a day on schedule. He feels that these help him. He uses albuterol HFA 3-4x a day. Wakes up at night once to take SABA. He was treated for an AE in January '16. He does not wheeze. He is limited activity, wants to be able to go fishing. He is wearing 2L/min at all time.    ROV 02/20/17 -- patient has a history of moderate-to-severe COPD with associated hypoxemic respiratory failure. Last seen in our office in April. He had a fall mid May while he was fishing, hurt his ankle, now improved. He reports that his breathing has been worse, slowly over the last several months. He believes that humidity bothers him as well. He is irritated by the O2 in his nose, no lesions or scabs. He did not get a POC, he says because medicare won;t pay for it. O2 set on 2-2.5L/min. He has seen desaturations with exertion, to 88%. He is on stiolto, uses SABA 2-3x a day. Some cough, daily, prod of clear to yellow. No wheezing.   ROV 05/18/17 -- Follow-up visit for moderate to severe COPD with associated hypoxemic respiratory failure. He has been managed with Stiolto, albuterol as needed. He feels that his breathing is worse since last time. No exertional tolerance, has to stop to rest even with O2 on. He has not had any increase in cough, wheeze. Productive of some thick mucous. Took mucinex for a few weeks, has now stopped it.    No flowsheet data found.  Review of Systems As per HPI     Objective:   Physical Exam Vitals:   05/18/17 1039 05/18/17 1040  BP:   124/76  Pulse:  67  SpO2:  96%  Weight: 193 lb (87.5 kg)   Height: 5' 8.5" (1.74 m)    Gen: Pleasant, elderly gentleman, in no distress,  normal affect on 2 L/m  ENT: No lesions,  mouth clear,  oropharynx clear, no postnasal drip  Neck: No JVD, no stridor  Lungs: No use of accessory muscles, clear without rales or rhonchi, no wheeze on forced expiration  Cardiovascular: distant, irregular, no murmur or gallops, no peripheral edema  Musculoskeletal: No deformities, no cyanosis or clubbing  Neuro: alert, non focal  Skin: Warm, bruising on both deltoids      Assessment & Plan:  COPD (chronic obstructive pulmonary disease) with emphysema Progressively worse functional capacity. Hypoxemic respiratory failure. He is on maximal bronchodilator therapy, inhaled corticosteroid. I believe at this point be reasonable to try prednisone. If he benefits I will taper to the lowest effective dose.  Baltazar Apo, MD, PhD 05/18/2017, 11:06 AM [facility] [phone] or if no answer [phone]

## 2017-05-18 NOTE — Patient Instructions (Addendum)
Please continue Stiolto and budesonide nebulizer as you are taking them  Take albuterol 2 puffs up to every 4 hours if needed for shortness of breath.  Continue your oxygen at 2.5L/min at all times.  We will start prednisone 20mg  once a day to see if you benefit. If so we will then slowly decrease the medication to the lowest effective dose.  Follow with Dr Lamonte Sakai next available to review your status.

## 2017-05-23 ENCOUNTER — Telehealth: Payer: Self-pay | Admitting: Emergency Medicine

## 2017-05-23 ENCOUNTER — Encounter: Payer: Self-pay | Admitting: Family Medicine

## 2017-05-23 DIAGNOSIS — J438 Other emphysema: Secondary | ICD-10-CM

## 2017-05-23 DIAGNOSIS — J9611 Chronic respiratory failure with hypoxia: Secondary | ICD-10-CM

## 2017-05-23 NOTE — Telephone Encounter (Signed)
Mary from [place]  to Luretha Rued       05/23/17 11:23 AM  Pt is trying to purchase a oxygen concentrator. Will need order along with office note as to why he needs this. [phone].   Attempted to call [employer] to get the correct fax number. The number given has too many numbers.

## 2017-05-23 NOTE — Telephone Encounter (Signed)
lmtcb X1 for pt. Will need to have name of company before fax can be sent.

## 2017-05-23 NOTE — Telephone Encounter (Signed)
Duplicate message. Please see other message from 05/23/17 regarding this same matter.

## 2017-05-23 NOTE — Telephone Encounter (Signed)
Per 8/30 OV, pt is wearing 2.5lpm 24/7.  Pt and wife are requesting we send an order for a POC to [employer]- verified fax number listed below.   RB ok to order POC?  Thanks!

## 2017-05-23 NOTE — Telephone Encounter (Signed)
Jeanett Schlein (patient's wife) called and states this needs to be faxed to [employer], fax # [phone], ATTN: Lanny Hurst.  If need to call her back, CB is [phone].

## 2017-05-23 NOTE — Telephone Encounter (Signed)
Yes ok to send.  

## 2017-05-23 NOTE — Telephone Encounter (Signed)
POC order placed.  Pt's wife aware.  Nothing further needed.

## 2017-05-30 ENCOUNTER — Ambulatory Visit (INDEPENDENT_AMBULATORY_CARE_PROVIDER_SITE_OTHER): Payer: Medicare Other | Admitting: *Deleted

## 2017-05-30 DIAGNOSIS — I4892 Unspecified atrial flutter: Secondary | ICD-10-CM | POA: Diagnosis not present

## 2017-05-30 DIAGNOSIS — I482 Chronic atrial fibrillation: Secondary | ICD-10-CM

## 2017-05-30 DIAGNOSIS — I4821 Permanent atrial fibrillation: Secondary | ICD-10-CM

## 2017-05-30 DIAGNOSIS — Z7901 Long term (current) use of anticoagulants: Secondary | ICD-10-CM | POA: Diagnosis not present

## 2017-05-30 DIAGNOSIS — Z5181 Encounter for therapeutic drug level monitoring: Secondary | ICD-10-CM

## 2017-05-30 DIAGNOSIS — Z8679 Personal history of other diseases of the circulatory system: Secondary | ICD-10-CM

## 2017-05-30 LAB — POCT INR: INR: 3

## 2017-06-13 ENCOUNTER — Ambulatory Visit (INDEPENDENT_AMBULATORY_CARE_PROVIDER_SITE_OTHER): Payer: Medicare Other | Admitting: *Deleted

## 2017-06-13 DIAGNOSIS — I4821 Permanent atrial fibrillation: Secondary | ICD-10-CM

## 2017-06-13 DIAGNOSIS — Z7901 Long term (current) use of anticoagulants: Secondary | ICD-10-CM

## 2017-06-13 DIAGNOSIS — Z8679 Personal history of other diseases of the circulatory system: Secondary | ICD-10-CM

## 2017-06-13 DIAGNOSIS — Z5181 Encounter for therapeutic drug level monitoring: Secondary | ICD-10-CM | POA: Diagnosis not present

## 2017-06-13 DIAGNOSIS — I4892 Unspecified atrial flutter: Secondary | ICD-10-CM | POA: Diagnosis not present

## 2017-06-13 DIAGNOSIS — I482 Chronic atrial fibrillation: Secondary | ICD-10-CM

## 2017-06-13 LAB — POCT INR: INR: 2.1

## 2017-06-20 ENCOUNTER — Encounter: Payer: Self-pay | Admitting: Emergency Medicine

## 2017-06-20 ENCOUNTER — Ambulatory Visit (INDEPENDENT_AMBULATORY_CARE_PROVIDER_SITE_OTHER): Payer: Medicare Other | Admitting: Emergency Medicine

## 2017-06-20 DIAGNOSIS — I2510 Atherosclerotic heart disease of native coronary artery without angina pectoris: Secondary | ICD-10-CM

## 2017-06-20 DIAGNOSIS — J9611 Chronic respiratory failure with hypoxia: Secondary | ICD-10-CM | POA: Diagnosis not present

## 2017-06-20 DIAGNOSIS — J438 Other emphysema: Secondary | ICD-10-CM | POA: Diagnosis not present

## 2017-06-20 MED ORDER — PREDNISONE 5 MG PO TABS: 5.0000 mg | ORAL_TABLET | Freq: Every day | ORAL | 2 refills | Status: DC

## 2017-06-20 NOTE — Assessment & Plan Note (Signed)
He was able to obtain Inogen POC, feels that he is benefiting from the change.   Please continue your oxygen by portable concentrator at 2-2.5 L/m pulsed flow

## 2017-06-20 NOTE — Progress Notes (Signed)
Subjective:    Patient ID: [name], male    DOB: April 26, 1936, 81 y.o.   MRN: [id number]  HPI 81 year old man, former smoker (90 pack years), with a history of mitral valve repair, atrial fibrillation (status post Maze procedure), coronary artery disease, history of stroke. He has been followed in our office by Dr Gwenette Greet for COPD and chronic hypoxemic respiratory failure. He has been managed on budesonide nebulized twice a day, Xopenex nebulized 4 times a day on schedule. He feels that these help him. He uses albuterol HFA 3-4x a day. Wakes up at night once to take SABA. He was treated for an AE in January '16. He does not wheeze. He is limited activity, wants to be able to go fishing. He is wearing 2L/min at all time.    ROV 02/20/17 -- patient has a history of moderate-to-severe COPD with associated hypoxemic respiratory failure. Last seen in our office in April. He had a fall mid May while he was fishing, hurt his ankle, now improved. He reports that his breathing has been worse, slowly over the last several months. He believes that humidity bothers him as well. He is irritated by the O2 in his nose, no lesions or scabs. He did not get a POC, he says because medicare won;t pay for it. O2 set on 2-2.5L/min. He has seen desaturations with exertion, to 88%. He is on stiolto, uses SABA 2-3x a day. Some cough, daily, prod of clear to yellow. No wheezing.   ROV 05/18/17 -- Follow-up visit for moderate to severe COPD with associated hypoxemic respiratory failure. He has been managed with Stiolto, albuterol as needed. He feels that his breathing is worse since last time. No exertional tolerance, has to stop to rest even with O2 on. He has not had any increase in cough, wheeze. Productive of some thick mucous. Took mucinex for a few weeks, has now stopped it.   ROV 06/20/17 -- [name] has a history of moderate-to-severe COPD with associated hypoxemic respiratory failure. Since last time he was able to  get his Inogen portable concentrator, set on 2L/min pulsed. Last time we started prednisone. He tells me that it has made him "feel good", but isn't sure that has changed his endurance or exertional dyspnea very much. He has more energy, wants to get out and do things. He coughs a few times a day - no real change, no wheeze. He is planning to get flu shot w his PCP.    No flowsheet data found.  Review of Systems As per HPI     Objective:   Physical Exam Vitals:   06/20/17 0854  BP: 122/82  Pulse: 92  SpO2: 93%  Weight: 192 lb (87.1 kg)  Height: 5\' 8"  (1.727 m)   Gen: Pleasant, elderly gentleman, in no distress,  normal affect on 2 L/m  ENT: No lesions,  mouth clear,  oropharynx clear, no postnasal drip  Neck: No JVD, no stridor  Lungs: No use of accessory muscles, Distant but clear. No wheezing during a normal respiratory cycle or with a forced expiration.  Cardiovascular: distant, irregular, no murmur or gallops, no peripheral edema  Musculoskeletal: No deformities, no cyanosis or clubbing  Neuro: alert, non focal  Skin: Warm, bruising on both deltoids      Assessment & Plan:  COPD (chronic obstructive pulmonary disease) with emphysema Continue Stiolto once a day Take albuterol 2 puffs up to every 4 hours if needed for shortness of breath.  We  will decrease prednisone to 15 mg daily for the next 2 weeks. If you continued to do well on the new dose then please decreased to 10 mg daily at that time. Stay on 10 mg daily until we follow-up. Get the flu shot from your PCP as planned.  Follow with Dr Lamonte Sakai in 2 month  Chronic hypoxemic respiratory failure ([place]) He was able to obtain Inogen POC, feels that he is benefiting from the change.   Please continue your oxygen by portable concentrator at 2-2.5 L/m pulsed flow  Baltazar Apo, MD, PhD 06/20/2017, 9:20 AM [facility] [phone] or if no answer [phone]

## 2017-06-20 NOTE — Patient Instructions (Addendum)
Please continue your oxygen by portable concentrator at 2-2.5 L/m pulsed flow Continue Stiolto once a day Take albuterol 2 puffs up to every 4 hours if needed for shortness of breath.  We will decrease prednisone to 15 mg daily for the next 2 weeks. If you continued to do well on the new dose then please decreased to 10 mg daily at that time. Stay on 10 mg daily until we follow-up. Get the flu shot from your PCP as planned.  Follow with Dr Lamonte Sakai in 2 month

## 2017-06-20 NOTE — Assessment & Plan Note (Signed)
Continue Stiolto once a day Take albuterol 2 puffs up to every 4 hours if needed for shortness of breath.  We will decrease prednisone to 15 mg daily for the next 2 weeks. If you continued to do well on the new dose then please decreased to 10 mg daily at that time. Stay on 10 mg daily until we follow-up. Get the flu shot from your PCP as planned.  Follow with Dr Lamonte Sakai in 2 month

## 2017-06-27 ENCOUNTER — Ambulatory Visit (INDEPENDENT_AMBULATORY_CARE_PROVIDER_SITE_OTHER): Payer: Medicare Other | Admitting: Pharmacist

## 2017-06-27 ENCOUNTER — Ambulatory Visit: Payer: Medicare Other | Admitting: Emergency Medicine

## 2017-06-27 DIAGNOSIS — Z7901 Long term (current) use of anticoagulants: Secondary | ICD-10-CM

## 2017-06-27 DIAGNOSIS — Z8679 Personal history of other diseases of the circulatory system: Secondary | ICD-10-CM | POA: Diagnosis not present

## 2017-06-27 DIAGNOSIS — I4892 Unspecified atrial flutter: Secondary | ICD-10-CM

## 2017-06-27 DIAGNOSIS — Z5181 Encounter for therapeutic drug level monitoring: Secondary | ICD-10-CM | POA: Diagnosis not present

## 2017-06-27 LAB — POCT INR: INR: 2.8

## 2017-06-30 ENCOUNTER — Ambulatory Visit: Payer: Medicare Other

## 2017-07-03 ENCOUNTER — Ambulatory Visit (INDEPENDENT_AMBULATORY_CARE_PROVIDER_SITE_OTHER): Payer: Medicare Other

## 2017-07-03 DIAGNOSIS — Z23 Encounter for immunization: Secondary | ICD-10-CM

## 2017-07-16 ENCOUNTER — Encounter: Payer: Self-pay | Admitting: Family Medicine

## 2017-07-18 ENCOUNTER — Inpatient Hospital Stay (HOSPITAL_BASED_OUTPATIENT_CLINIC_OR_DEPARTMENT_OTHER)
Admission: EM | Admit: 2017-07-18 | Discharge: 2017-07-19 | DRG: 190 | Disposition: A | Discharge status: Home Health | Payer: Medicare Other | Attending: Family Medicine | Admitting: Family Medicine

## 2017-07-18 ENCOUNTER — Ambulatory Visit (INDEPENDENT_AMBULATORY_CARE_PROVIDER_SITE_OTHER): Payer: Medicare Other | Admitting: Family Medicine

## 2017-07-18 ENCOUNTER — Encounter (HOSPITAL_BASED_OUTPATIENT_CLINIC_OR_DEPARTMENT_OTHER): Payer: Self-pay | Admitting: *Deleted

## 2017-07-18 ENCOUNTER — Encounter: Payer: Self-pay | Admitting: Family Medicine

## 2017-07-18 ENCOUNTER — Emergency Department (HOSPITAL_BASED_OUTPATIENT_CLINIC_OR_DEPARTMENT_OTHER): Payer: Medicare Other

## 2017-07-18 VITALS — BP 121/73 | HR 97 | Temp 98.0°F | Resp 16 | Ht 65.5 in | Wt 192.8 lb

## 2017-07-18 DIAGNOSIS — E785 Hyperlipidemia, unspecified: Secondary | ICD-10-CM | POA: Diagnosis present

## 2017-07-18 DIAGNOSIS — Z8673 Personal history of transient ischemic attack (TIA), and cerebral infarction without residual deficits: Secondary | ICD-10-CM | POA: Diagnosis not present

## 2017-07-18 DIAGNOSIS — I700 Atherosclerosis of aorta: Secondary | ICD-10-CM | POA: Diagnosis present

## 2017-07-18 DIAGNOSIS — Z7951 Long term (current) use of inhaled steroids: Secondary | ICD-10-CM

## 2017-07-18 DIAGNOSIS — I2510 Atherosclerotic heart disease of native coronary artery without angina pectoris: Secondary | ICD-10-CM | POA: Diagnosis not present

## 2017-07-18 DIAGNOSIS — J9621 Acute and chronic respiratory failure with hypoxia: Secondary | ICD-10-CM

## 2017-07-18 DIAGNOSIS — I5033 Acute on chronic diastolic (congestive) heart failure: Secondary | ICD-10-CM | POA: Diagnosis present

## 2017-07-18 DIAGNOSIS — Z7901 Long term (current) use of anticoagulants: Secondary | ICD-10-CM | POA: Diagnosis not present

## 2017-07-18 DIAGNOSIS — I130 Hypertensive heart and chronic kidney disease with heart failure and stage 1 through stage 4 chronic kidney disease, or unspecified chronic kidney disease: Secondary | ICD-10-CM | POA: Diagnosis present

## 2017-07-18 DIAGNOSIS — J441 Chronic obstructive pulmonary disease with (acute) exacerbation: Secondary | ICD-10-CM | POA: Diagnosis not present

## 2017-07-18 DIAGNOSIS — Z7952 Long term (current) use of systemic steroids: Secondary | ICD-10-CM

## 2017-07-18 DIAGNOSIS — Z8249 Family history of ischemic heart disease and other diseases of the circulatory system: Secondary | ICD-10-CM | POA: Diagnosis not present

## 2017-07-18 DIAGNOSIS — Z952 Presence of prosthetic heart valve: Secondary | ICD-10-CM | POA: Diagnosis not present

## 2017-07-18 DIAGNOSIS — R0682 Tachypnea, not elsewhere classified: Secondary | ICD-10-CM | POA: Diagnosis not present

## 2017-07-18 DIAGNOSIS — Z6831 Body mass index (BMI) 31.0-31.9, adult: Secondary | ICD-10-CM

## 2017-07-18 DIAGNOSIS — E669 Obesity, unspecified: Secondary | ICD-10-CM | POA: Diagnosis present

## 2017-07-18 DIAGNOSIS — R000 Tachycardia, unspecified: Secondary | ICD-10-CM | POA: Diagnosis present

## 2017-07-18 DIAGNOSIS — I4891 Unspecified atrial fibrillation: Secondary | ICD-10-CM | POA: Diagnosis present

## 2017-07-18 DIAGNOSIS — Z79899 Other long term (current) drug therapy: Secondary | ICD-10-CM

## 2017-07-18 DIAGNOSIS — R748 Abnormal levels of other serum enzymes: Secondary | ICD-10-CM

## 2017-07-18 DIAGNOSIS — I272 Pulmonary hypertension, unspecified: Secondary | ICD-10-CM | POA: Diagnosis present

## 2017-07-18 DIAGNOSIS — E119 Type 2 diabetes mellitus without complications: Secondary | ICD-10-CM | POA: Diagnosis not present

## 2017-07-18 DIAGNOSIS — I509 Heart failure, unspecified: Secondary | ICD-10-CM

## 2017-07-18 DIAGNOSIS — Z9981 Dependence on supplemental oxygen: Secondary | ICD-10-CM

## 2017-07-18 DIAGNOSIS — Z8546 Personal history of malignant neoplasm of prostate: Secondary | ICD-10-CM | POA: Diagnosis not present

## 2017-07-18 DIAGNOSIS — R7989 Other specified abnormal findings of blood chemistry: Secondary | ICD-10-CM | POA: Diagnosis not present

## 2017-07-18 DIAGNOSIS — E1122 Type 2 diabetes mellitus with diabetic chronic kidney disease: Secondary | ICD-10-CM | POA: Diagnosis present

## 2017-07-18 DIAGNOSIS — I248 Other forms of acute ischemic heart disease: Secondary | ICD-10-CM | POA: Diagnosis present

## 2017-07-18 DIAGNOSIS — I110 Hypertensive heart disease with heart failure: Secondary | ICD-10-CM | POA: Diagnosis not present

## 2017-07-18 DIAGNOSIS — R0602 Shortness of breath: Secondary | ICD-10-CM | POA: Diagnosis not present

## 2017-07-18 DIAGNOSIS — Z87891 Personal history of nicotine dependence: Secondary | ICD-10-CM

## 2017-07-18 DIAGNOSIS — J449 Chronic obstructive pulmonary disease, unspecified: Secondary | ICD-10-CM

## 2017-07-18 DIAGNOSIS — I361 Nonrheumatic tricuspid (valve) insufficiency: Secondary | ICD-10-CM | POA: Diagnosis not present

## 2017-07-18 DIAGNOSIS — J9611 Chronic respiratory failure with hypoxia: Secondary | ICD-10-CM | POA: Diagnosis present

## 2017-07-18 DIAGNOSIS — Z823 Family history of stroke: Secondary | ICD-10-CM | POA: Diagnosis not present

## 2017-07-18 DIAGNOSIS — R0600 Dyspnea, unspecified: Secondary | ICD-10-CM

## 2017-07-18 DIAGNOSIS — I10 Essential (primary) hypertension: Secondary | ICD-10-CM | POA: Diagnosis present

## 2017-07-18 DIAGNOSIS — R0609 Other forms of dyspnea: Secondary | ICD-10-CM | POA: Diagnosis not present

## 2017-07-18 DIAGNOSIS — I480 Paroxysmal atrial fibrillation: Secondary | ICD-10-CM | POA: Diagnosis not present

## 2017-07-18 DIAGNOSIS — Z809 Family history of malignant neoplasm, unspecified: Secondary | ICD-10-CM

## 2017-07-18 DIAGNOSIS — Z9889 Other specified postprocedural states: Secondary | ICD-10-CM

## 2017-07-18 DIAGNOSIS — N183 Chronic kidney disease, stage 3 (moderate): Secondary | ICD-10-CM | POA: Diagnosis present

## 2017-07-18 DIAGNOSIS — R778 Other specified abnormalities of plasma proteins: Secondary | ICD-10-CM

## 2017-07-18 LAB — COMPREHENSIVE METABOLIC PANEL
ALK PHOS: 60 U/L (ref 38–126)
ALT: 16 U/L — AB (ref 17–63)
AST: 18 U/L (ref 15–41)
Albumin: 3.5 g/dL (ref 3.5–5.0)
Anion gap: 11 (ref 5–15)
BILIRUBIN TOTAL: 1.2 mg/dL (ref 0.3–1.2)
BUN: 12 mg/dL (ref 6–20)
CALCIUM: 9 mg/dL (ref 8.9–10.3)
CO2: 22 mmol/L (ref 22–32)
CREATININE: 1.35 mg/dL — AB (ref 0.61–1.24)
Chloride: 102 mmol/L (ref 101–111)
GFR, EST AFRICAN AMERICAN: 56 mL/min — AB (ref >60)
GFR, EST NON AFRICAN AMERICAN: 48 mL/min — AB (ref >60)
Glucose, Bld: 104 mg/dL — ABNORMAL HIGH (ref 65–99)
Potassium: 3.9 mmol/L (ref 3.5–5.1)
Sodium: 135 mmol/L (ref 135–145)
TOTAL PROTEIN: 6.6 g/dL (ref 6.5–8.1)

## 2017-07-18 LAB — TROPONIN I
TROPONIN I: 0.05 ng/mL — AB (ref <0.03)
TROPONIN I: 0.04 ng/mL — AB (ref <0.03)
TROPONIN I: 0.03 ng/mL — AB (ref <0.03)

## 2017-07-18 LAB — PROCALCITONIN: Procalcitonin: <0.1 ng/mL

## 2017-07-18 LAB — CBC WITH DIFFERENTIAL/PLATELET
BASOS PCT: 0 %
Basophils Absolute: 0 10*3/uL (ref 0.0–0.1)
EOS PCT: 1 %
Eosinophils Absolute: 0.1 10*3/uL (ref 0.0–0.7)
HCT: 42.4 % (ref 39.0–52.0)
HEMOGLOBIN: 13.8 g/dL (ref 13.0–17.0)
Lymphocytes Relative: 22 %
Lymphs Abs: 2 10*3/uL (ref 0.7–4.0)
MCH: 28.6 pg (ref 26.0–34.0)
MCHC: 32.5 g/dL (ref 30.0–36.0)
MCV: 87.8 fL (ref 78.0–100.0)
MONO ABS: 1.2 10*3/uL — AB (ref 0.1–1.0)
Monocytes Relative: 13 %
NEUTROS PCT: 64 %
Neutro Abs: 5.8 10*3/uL (ref 1.7–7.7)
PLATELETS: 181 10*3/uL (ref 150–400)
RBC: 4.83 MIL/uL (ref 4.22–5.81)
RDW: 16.3 % — ABNORMAL HIGH (ref 11.5–15.5)
WBC: 9.1 10*3/uL (ref 4.0–10.5)

## 2017-07-18 LAB — I-STAT CHEM 8, ED
BUN: 14 mg/dL (ref 6–20)
CALCIUM ION: 1.23 mmol/L (ref 1.15–1.40)
Chloride: 99 mmol/L — ABNORMAL LOW (ref 101–111)
Creatinine, Ser: 1.2 mg/dL (ref 0.61–1.24)
Glucose, Bld: 105 mg/dL — ABNORMAL HIGH (ref 65–99)
HCT: 44 % (ref 39.0–52.0)
Hemoglobin: 15 g/dL (ref 13.0–17.0)
Potassium: 3.9 mmol/L (ref 3.5–5.1)
SODIUM: 137 mmol/L (ref 135–145)
TCO2: 23 mmol/L (ref 22–32)

## 2017-07-18 LAB — BRAIN NATRIURETIC PEPTIDE: B NATRIURETIC PEPTIDE 5: 65.3 pg/mL (ref 0.0–100.0)

## 2017-07-18 LAB — PROTIME-INR
INR: 2.15
Prothrombin Time: 23.8 seconds — ABNORMAL HIGH (ref 11.4–15.2)

## 2017-07-18 LAB — INFLUENZA PANEL BY PCR (TYPE A & B)
INFLAPCR: NEGATIVE
INFLBPCR: NEGATIVE

## 2017-07-18 LAB — DIGOXIN LEVEL

## 2017-07-18 LAB — I-STAT CG4 LACTIC ACID, ED: LACTIC ACID, VENOUS: 1.15 mmol/L (ref 0.5–1.9)

## 2017-07-18 MED ORDER — ONDANSETRON HCL 4 MG PO TABS: 4.0000 mg | ORAL_TABLET | Freq: Four times a day (QID) | ORAL | Status: DC | PRN

## 2017-07-18 MED ORDER — IPRATROPIUM-ALBUTEROL 0.5-2.5 (3) MG/3ML IN SOLN
3.0000 mL | Freq: Four times a day (QID) | RESPIRATORY_TRACT | Status: DC
  Administered 2017-07-18 – 2017-07-19 (×2): 3 mL via RESPIRATORY_TRACT
  Filled 2017-07-18 (×2): qty 3

## 2017-07-18 MED ORDER — ONDANSETRON HCL 4 MG/2ML IJ SOLN: 4.0000 mg | Freq: Four times a day (QID) | INTRAMUSCULAR | Status: DC | PRN

## 2017-07-18 MED ORDER — SENNOSIDES-DOCUSATE SODIUM 8.6-50 MG PO TABS: 1.0000 | ORAL_TABLET | Freq: Every evening | ORAL | Status: DC | PRN

## 2017-07-18 MED ORDER — LEVOFLOXACIN 750 MG PO TABS
750.0000 mg | ORAL_TABLET | Freq: Once | ORAL | Status: AC
  Administered 2017-07-18: 750 mg via ORAL
  Filled 2017-07-18: qty 1

## 2017-07-18 MED ORDER — WARFARIN SODIUM 5 MG PO TABS
5.0000 mg | ORAL_TABLET | Freq: Once | ORAL | Status: AC
  Administered 2017-07-18: 5 mg via ORAL
  Filled 2017-07-18: qty 1

## 2017-07-18 MED ORDER — PRAVASTATIN SODIUM 40 MG PO TABS
40.0000 mg | ORAL_TABLET | Freq: Every evening | ORAL | Status: DC
  Administered 2017-07-18: 40 mg via ORAL
  Filled 2017-07-18: qty 1

## 2017-07-18 MED ORDER — BUDESONIDE 0.5 MG/2ML IN SUSP
0.5000 mg | Freq: Two times a day (BID) | RESPIRATORY_TRACT | Status: DC
  Administered 2017-07-18 – 2017-07-19 (×2): 0.5 mg via RESPIRATORY_TRACT
  Filled 2017-07-18 (×3): qty 2

## 2017-07-18 MED ORDER — IPRATROPIUM-ALBUTEROL 0.5-2.5 (3) MG/3ML IN SOLN
3.0000 mL | Freq: Four times a day (QID) | RESPIRATORY_TRACT | Status: DC
  Administered 2017-07-18 (×2): 3 mL via RESPIRATORY_TRACT
  Filled 2017-07-18 (×2): qty 3

## 2017-07-18 MED ORDER — LEVOFLOXACIN 500 MG PO TABS
500.0000 mg | ORAL_TABLET | Freq: Every day | ORAL | Status: DC
  Administered 2017-07-19: 500 mg via ORAL
  Filled 2017-07-18: qty 1

## 2017-07-18 MED ORDER — ACETAMINOPHEN 325 MG PO TABS: 650.0000 mg | ORAL_TABLET | Freq: Four times a day (QID) | ORAL | Status: DC | PRN

## 2017-07-18 MED ORDER — ASPIRIN 81 MG PO CHEW
324.0000 mg | CHEWABLE_TABLET | Freq: Once | ORAL | Status: AC
  Administered 2017-07-18: 324 mg via ORAL
  Filled 2017-07-18: qty 4

## 2017-07-18 MED ORDER — GUAIFENESIN 100 MG/5ML PO SOLN
5.0000 mL | ORAL | Status: DC | PRN
  Administered 2017-07-18: 100 mg via ORAL
  Filled 2017-07-18: qty 5

## 2017-07-18 MED ORDER — DIGOXIN 125 MCG PO TABS
0.1250 mg | ORAL_TABLET | Freq: Every day | ORAL | Status: DC
Start: 2017-07-19 — End: 2017-07-19
  Administered 2017-07-19: 0.125 mg via ORAL
  Filled 2017-07-18: qty 1

## 2017-07-18 MED ORDER — FUROSEMIDE 10 MG/ML IJ SOLN
40.0000 mg | Freq: Two times a day (BID) | INTRAMUSCULAR | Status: DC
  Administered 2017-07-18 – 2017-07-19 (×2): 40 mg via INTRAVENOUS
  Filled 2017-07-18 (×2): qty 4

## 2017-07-18 MED ORDER — METHYLPREDNISOLONE SODIUM SUCC 125 MG IJ SOLR
125.0000 mg | Freq: Once | INTRAMUSCULAR | Status: AC
  Administered 2017-07-18: 125 mg via INTRAVENOUS
  Filled 2017-07-18: qty 2

## 2017-07-18 MED ORDER — METHYLPREDNISOLONE SODIUM SUCC 40 MG IJ SOLR
40.0000 mg | Freq: Three times a day (TID) | INTRAMUSCULAR | Status: DC
  Administered 2017-07-18 – 2017-07-19 (×3): 40 mg via INTRAVENOUS
  Filled 2017-07-18 (×3): qty 1

## 2017-07-18 MED ORDER — DILTIAZEM HCL ER COATED BEADS 240 MG PO CP24
240.0000 mg | ORAL_CAPSULE | Freq: Every day | ORAL | Status: DC
  Administered 2017-07-19: 240 mg via ORAL
  Filled 2017-07-18: qty 1

## 2017-07-18 MED ORDER — LORAZEPAM 0.5 MG PO TABS
0.5000 mg | ORAL_TABLET | Freq: Three times a day (TID) | ORAL | Status: DC | PRN
  Administered 2017-07-18: 0.5 mg via ORAL
  Filled 2017-07-18: qty 1

## 2017-07-18 MED ORDER — FUROSEMIDE 10 MG/ML IJ SOLN
40.0000 mg | Freq: Once | INTRAMUSCULAR | Status: AC
  Administered 2017-07-18: 40 mg via INTRAVENOUS
  Filled 2017-07-18: qty 4

## 2017-07-18 MED ORDER — OLOPATADINE HCL 0.1 % OP SOLN
1.0000 [drp] | Freq: Two times a day (BID) | OPHTHALMIC | Status: DC
  Administered 2017-07-18: 1 [drp] via OPHTHALMIC
  Filled 2017-07-18: qty 5

## 2017-07-18 MED ORDER — LORAZEPAM 0.5 MG PO TABS
0.5000 mg | ORAL_TABLET | Freq: Two times a day (BID) | ORAL | Status: DC | PRN
  Administered 2017-07-19: 0.5 mg via ORAL
  Filled 2017-07-18: qty 1

## 2017-07-18 MED ORDER — ALBUTEROL SULFATE (2.5 MG/3ML) 0.083% IN NEBU: 3.0000 mL | INHALATION_SOLUTION | Freq: Four times a day (QID) | RESPIRATORY_TRACT | Status: DC | PRN

## 2017-07-18 MED ORDER — ACETAMINOPHEN 650 MG RE SUPP: 650.0000 mg | Freq: Four times a day (QID) | RECTAL | Status: DC | PRN

## 2017-07-18 MED ORDER — WARFARIN - PHARMACIST DOSING INPATIENT: Freq: Every day | Status: DC

## 2017-07-18 NOTE — Progress Notes (Addendum)
OFFICE VISIT  07/18/2017   CC:  Chief Complaint  Patient presents with  . Shortness of Breath    called EMS this morning   HPI:    Patient is a 81 y.o.  male who presents accompanied by wife for 3 mo f/u DM 2, HTN, CRI stage III (GFR 50s). He has a-fib that is managed with rate control + coumadin anticoag, followed by cardiology coumadin clinic. He also has chronic hypoxic resp failure secondary to severe COPD, managed by pulmonology--recently started on low dose daily prednisone therapy--this was titrated down from 20 mg to 15mg  qd about 2 weeks ago.  Here for f/u chronic illnesses, but changed to acute illness evaluation due to pt having acute SOB today. Was feeling in normal state of relatively chronic poor health yesterday, then in the evening he began to feel SOB, then about midnight he felt acutely worse SOB in bed while awake, increased DOE. Unclear exactly when, but he began doubling dose of neb.  Increased oxygen from 2L (his baseline) to 4L, but pt did not document any hypoxia prior.  Denies CP or chest pressure.  Cough no more than usual.  Wheezing no more than usual.  No feeling of bloating/increased abd girth, or swelling legs. No pain in legs.  No nausea, diaphoresis, sx's in left arm or in jaw. He felt improved on increased oxygen.  No fever.   He called EMS and they evaluated him, and pt brings in an EKG strip today and their is some vital signs info from EMS written on it:  Initial VS at 05:48--pulse ox 86% on 4LPM continuous, BP 159/71, HR 90-115 (a-fib), CBG 120.  At 06:00---pulse ox 95%, HR 100 (a-fib), BP 152/73. I reviewed the EKG strips from the EMS this morning:  It showed a-fib, RV hypertrophy, RBBB, ? Bifascicular block, rate 92 bpm. Patient asked patient if he wanted to be taken to the ED but he adamantly declined.  Review of Systems  Constitutional: Positive for fatigue. Negative for appetite change, chills and fever.  HENT: Negative for congestion, dental  problem, ear pain and sore throat.   Eyes: Negative for discharge, redness and visual disturbance.  Respiratory: Positive for shortness of breath. Negative for cough, chest tightness and wheezing.   Cardiovascular: Negative for chest pain, palpitations and leg swelling.  Gastrointestinal: Negative for abdominal pain, blood in stool, diarrhea, nausea and vomiting.  Genitourinary: Negative for difficulty urinating, dysuria, flank pain, frequency, hematuria and urgency.  Musculoskeletal: Negative for arthralgias, back pain, joint swelling, myalgias and neck stiffness.  Skin: Negative for pallor and rash.  Neurological: Positive for weakness (generalized--chronic). Negative for dizziness, speech difficulty and headaches.  Hematological: Negative for adenopathy. Does not bruise/bleed easily.  Psychiatric/Behavioral: Negative for confusion and sleep disturbance. The patient is not nervous/anxious.      Past Medical History:  Diagnosis Date  . Adrenal adenoma   . Atrial fibrillation (HCC)    Rate control + Coumadin  . Atrial flutter ([place])   . CAD (coronary artery disease)    Lexiscan Myoview (01/2014): Lateral soft tissue attenuation, no ischemia, not gated, low risk  . CAP (community acquired pneumonia)    Hospitalized 10/2014  . Chronic hypoxemic respiratory failure (HCC)    from COPD  . Chronic renal insufficiency, stage III (moderate) (HCC)    CrCl 50s  . COPD (chronic obstructive pulmonary disease) (HCC)    oxygen-dependent (2.5 L 24/7).  Improved on starting chronic prednisone 06/2017.  . Diabetes mellitus without complication (  [place]) 06/14/2016   A1c 6.5 % 05/2016  . Diverticulosis   . Dyslipidemia   . Dyspnea    Multifactorial: deconditioning, COPD, obesity hypoventilation syndrome.  . Embolic stroke ([place])    d/t endocarditis 2008  . Hemorrhoids   . History of mitral valve replacement    needs SBE prophylaxis  . History of prostate cancer remote past   f/u by Dr. Claris Che; no  recurrence as of f/u 10/2015.  PSA rising from 2013 to 06/2016: as of 02/2017 plan is bone scan (CT if PSA gets above 5).  Marland Kitchen HTN (hypertension)   . Mediastinal lymphadenopathy   . Pulmonary hypertension ([place]) 01/09/2015   secondary  . Right thyroid nodule   . Thrombocytopenia ([place])   . TIA (transient ischemic attack) 03/25/2011   ? of due to transient diplopia  . Ulcerative colitis    left-sided/segmental, associated with diverticulosis.  Sulfasalazine per GI (Dr. Carlean Purl).  . Warthin's tumor     Past Surgical History:  Procedure Laterality Date  . carotid duplex dopplers  04/2014   No signif plaques; repeat 2 yrs per cardiology  . CATARACT EXTRACTION, BILATERAL    . COLONOSCOPY W/ BIOPSIES  12/19/2007   left-sided colitis, diverticulosis, hemorrhoids  . FLEXIBLE SIGMOIDOSCOPY  01/11/2008; 03/19/2010   2009 and 2011:segmental left colitis, diverticulosis, hemorrhoids  . INGUINAL HERNIA REPAIR     right  . MITRAL VALVE REPAIR  09/22/04   and modified Cox-Maze IV   . MITRAL VALVE REPAIR  09/14/05   redo MV repair d/t endocardiits/embolic events  . PROSTATECTOMY  06/2003   Radical  . RIGHT HEART CATHETERIZATION N/A 01/01/2015   Procedure: RIGHT HEART CATH;  Surgeon: Peter M Martinique, MD;  Location: [facility] CATH LAB;  Service: Cardiovascular;  Laterality: N/A;  . SALIVARY GLAND SURGERY     left resection  . TRANSTHORACIC ECHOCARDIOGRAM  11/2013   Mod LVH, EF 55-60%, no WM abnormalities, +LA dilation, +severe RV dilation and impaired systolic fxn, +increased pulm art pressures.  Valves ok.    No facility-administered medications prior to visit.    Outpatient Medications Prior to Visit  Medication Sig Dispense Refill  . albuterol (PROVENTIL HFA;VENTOLIN HFA) 108 (90 Base) MCG/ACT inhaler Inhale 1 puff into the lungs every 6 (six) hours as needed for wheezing or shortness of breath (using 5-6 times every 24 hrs). 3 Inhaler 2  . budesonide (PULMICORT) 0.5 MG/2ML nebulizer solution USE ONE VIAL IN  NEBULIZER TWICE DAILY 120 mL 5  . digoxin (LANOXIN) 0.125 MG tablet Take 1 tablet (0.125 mg total) by mouth daily. 90 tablet 3  . diltiazem (CARDIZEM CD) 240 MG 24 hr capsule Take 1 capsule (240 mg total) by mouth daily. 90 capsule 3  . furosemide (LASIX) 40 MG tablet Take one (1) tablet by mouth alternating with half (1/2) tablet by mouth daily. 90 tablet 3  . LORazepam (ATIVAN) 0.5 MG tablet Take 1 tablet (0.5 mg total) by mouth 2 (two) times daily as needed for anxiety. 30 tablet 5  . olopatadine (PATANOL) 0.1 % ophthalmic solution Place 1 drop into both eyes 2 (two) times daily. 5 mL 12  . pravastatin (PRAVACHOL) 40 MG tablet Take 1 tablet (40 mg total) by mouth every evening. 90 tablet 2  . predniSONE (DELTASONE) 20 MG tablet Take 1 tablet (20 mg total) by mouth daily with breakfast. 30 tablet 5  . predniSONE (DELTASONE) 5 MG tablet Take 1 tablet (5 mg total) by mouth daily with breakfast. 30 tablet 2  .  Tiotropium Bromide-Olodaterol (STIOLTO RESPIMAT) 2.5-2.5 MCG/ACT AERS Inhale 2 puffs into the lungs daily. 3 Inhaler 3  . warfarin (COUMADIN) 5 MG tablet Take 1 to 1.5 tablets by mouth daily as directed by coumadin clinic 105 tablet 1    No Known Allergies  ROS As per HPI  PE: Blood pressure 121/73, pulse 97, temperature 98 F (36.7 C), temperature source Oral, resp. rate 16, height 5' 5.5" (1.664 m), weight 192 lb 12 oz (87.4 kg), SpO2 93 %. Gen: alert, tired-appearing but NAD.   AFFECT: pleasant, lucid thought and speech. He is dyspneic when walking from clinic entrance to exam room, wearing 4 L oxygen--pulsed. CV: irreg irreg, rate varies from 80s up to 130s, no murmur or rub. Chest is clear, no wheezing or rales. Normal symmetric air entry throughout both lung fields. No chest wall deformities or tenderness. ABD: soft, NT/ND EXT: no clubbing, cyanosis, or edema.    LABS:  Lab Results  Component Value Date   TSH 3.24 12/09/2014   Lab Results  Component Value Date   WBC  9.1 07/18/2017   HGB 15.0 07/18/2017   HCT 44.0 07/18/2017   MCV 87.8 07/18/2017   PLT 181 07/18/2017   Lab Results  Component Value Date   CREATININE 1.20 07/18/2017   BUN 14 07/18/2017   NA 137 07/18/2017   K 3.9 07/18/2017   CL 99 (L) 07/18/2017   CO2 22 07/18/2017   Lab Results  Component Value Date   ALT 16 (L) 07/18/2017   AST 18 07/18/2017   ALKPHOS 60 07/18/2017   BILITOT 1.2 07/18/2017   Lab Results  Component Value Date   CHOL 143 10/12/2016   Lab Results  Component Value Date   HDL 44.20 10/12/2016   Lab Results  Component Value Date   LDLCALC 79 10/12/2016   Lab Results  Component Value Date   TRIG 100.0 10/12/2016   Lab Results  Component Value Date   CHOLHDL 3 10/12/2016   Lab Results  Component Value Date   PSA 0.08 Test Methodology: Hybritech PSA (L) 12/18/2007   PSA 0.04 Test Methodology: Hybritech PSA (L) 11/24/2007   Lab Results  Component Value Date   HGBA1C 6.2 04/17/2017   12 lead EKG today:  Atrial fibrillation  -irregular conduction  - occasional ectopic ventricular beat    Low voltage in limb leads.   -Right sided conduction defect and right axis -possible right ventricular hypertrophy or posterior fascicular block.  This is the same as the EKG done by EMS this morning (to be scanned into chart). Compared to EKG in EMR 12/13/16, the a-fib is new. (Pt with known dx of chronic a-fib--PM)  IMPRESSION AND PLAN:  Acute on chronic hypoxic resp failure (worsened DOE + increased oxygen requirement the last 1 day). Could be acute exac of COPD but typical sx's (besides DOE) and exam findings not suggestive of this currently.  Acute PE not high suspicion in this patient on warfarin. Question of acute CHF. I discussed with pt the fact that further diagnostic eval and potentially additional management needed, so I recommended he go to the emergency department.  He absolutely did not want to go to the hospital, but agreed to go to [place]  ED.  His wife (and Pt) felt comfortable transporting him there by their car today.  An After Visit Summary was printed and given to the patient.  Pt with increased baseline SOB and DOE, increased oxygen requirement. Unclear why, possibly acute heart  failure. Encouraged pt to let us call EMS to transport him to hospital, but he declined. He did agree, however, to having his wife drive him to med center [facility] for further evaluation.  An After Visit Summary was printed and given to the patient.  FOLLOW UP: Return for f/u to be determined based on results of ED eval.  Signed:  Crissie Sickles, MD           07/18/2017

## 2017-07-18 NOTE — Progress Notes (Signed)
ANTICOAGULATION CONSULT NOTE - Initial Consult  Pharmacy Consult for warfarin Indication: atrial fibrillation  No Known Allergies  Patient Measurements: Height: 5' 5.5" (166.4 cm) Weight: 186 lb 11.2 oz (84.7 kg) IBW/kg (Calculated) : 62.65  Vital Signs: Temp: 97.2 F (36.2 C) (10/30 1801) Temp Source: Oral (10/30 1801) BP: 121/66 (10/30 1801) Pulse Rate: 73 (10/30 1801)  Labs:  Recent Labs  07/18/17 1007 07/18/17 1014 07/18/17 1407  HGB 13.8 15.0  --   HCT 42.4 44.0  --   PLT 181  --   --   LABPROT 23.8*  --   --   INR 2.15  --   --   CREATININE 1.35* 1.20  --   TROPONINI 0.05*  --  0.04*    Estimated Creatinine Clearance: 50.5 mL/min (by C-G formula based on SCr of 1.2 mg/dL).   Medical History: Past Medical History:  Diagnosis Date  . Adrenal adenoma   . Atrial fibrillation (HCC)    Rate control + Coumadin  . Atrial flutter ([place])   . CAD (coronary artery disease)    Lexiscan Myoview (01/2014): Lateral soft tissue attenuation, no ischemia, not gated, low risk  . CAP (community acquired pneumonia)    Hospitalized 10/2014  . Chronic hypoxemic respiratory failure (HCC)    from COPD  . Chronic renal insufficiency, stage III (moderate) (HCC)    CrCl 50s  . COPD (chronic obstructive pulmonary disease) (HCC)    oxygen-dependent (2.5 L 24/7).  Improved on starting chronic prednisone 06/2017.  . Diabetes mellitus without complication ([place]) 16/06/9603   A1c 6.5 % 05/2016  . Diverticulosis   . Dyslipidemia   . Dyspnea    Multifactorial: deconditioning, COPD, obesity hypoventilation syndrome.  . Embolic stroke ([place])    d/t endocarditis 2008  . Hemorrhoids   . History of mitral valve replacement    needs SBE prophylaxis  . History of prostate cancer remote past   f/u by Dr. Claris Che; no recurrence as of f/u 10/2015.  PSA rising from 2013 to 06/2016: as of 02/2017 plan is bone scan (CT if PSA gets above 5).  Marland Kitchen HTN (hypertension)   . Mediastinal lymphadenopathy   .  Pulmonary hypertension ([place]) 01/09/2015   secondary  . Right thyroid nodule   . Thrombocytopenia ([place])   . TIA (transient ischemic attack) 03/25/2011   ? of due to transient diplopia  . Ulcerative colitis    left-sided/segmental, associated with diverticulosis.  Sulfasalazine per GI (Dr. Carlean Purl).  . Warthin's tumor     Medications:  Prescriptions Prior to Admission  Medication Sig Dispense Refill Last Dose  . albuterol (PROVENTIL HFA;VENTOLIN HFA) 108 (90 Base) MCG/ACT inhaler Inhale 1 puff into the lungs every 6 (six) hours as needed for wheezing or shortness of breath (using 5-6 times every 24 hrs). 3 Inhaler 2 Taking  . budesonide (PULMICORT) 0.5 MG/2ML nebulizer solution USE ONE VIAL IN NEBULIZER TWICE DAILY 120 mL 5 Taking  . digoxin (LANOXIN) 0.125 MG tablet Take 1 tablet (0.125 mg total) by mouth daily. 90 tablet 3 Taking  . diltiazem (CARDIZEM CD) 240 MG 24 hr capsule Take 1 capsule (240 mg total) by mouth daily. 90 capsule 3 Taking  . furosemide (LASIX) 40 MG tablet Take one (1) tablet by mouth alternating with half (1/2) tablet by mouth daily. 90 tablet 3 Taking  . LORazepam (ATIVAN) 0.5 MG tablet Take 1 tablet (0.5 mg total) by mouth 2 (two) times daily as needed for anxiety. 30 tablet 5 Taking  .  olopatadine (PATANOL) 0.1 % ophthalmic solution Place 1 drop into both eyes 2 (two) times daily. 5 mL 12 Taking  . pravastatin (PRAVACHOL) 40 MG tablet Take 1 tablet (40 mg total) by mouth every evening. 90 tablet 2 Taking  . predniSONE (DELTASONE) 20 MG tablet Take 1 tablet (20 mg total) by mouth daily with breakfast. 30 tablet 5 Taking  . predniSONE (DELTASONE) 5 MG tablet Take 1 tablet (5 mg total) by mouth daily with breakfast. 30 tablet 2 Taking  . Tiotropium Bromide-Olodaterol (STIOLTO RESPIMAT) 2.5-2.5 MCG/ACT AERS Inhale 2 puffs into the lungs daily. 3 Inhaler 3 Taking  . warfarin (COUMADIN) 5 MG tablet Take 1 to 1.5 tablets by mouth daily as directed by coumadin clinic 105 tablet  1 Taking    Assessment: 81 y/o male who presented to the ED with SOB. He is on chronic warfarin for hx stroke and Afib. INR is therapeutic at 2.15. No bleeding noted, CBC is normal. Levaquin started which can increase sensitivity to warfarin - will need to monitor closely.  PTA regimen: 5 mg/d, last dose 10/29  Goal of Therapy:  INR 2-3 Monitor platelets by anticoagulation protocol: Yes   Plan:  - Warfarin 5 mg PO tonight - Daily INR - Monitor for s/sx of bleeding   Renold Genta, PharmD, BCPS Clinical Pharmacist Phone for today - [place] - [phone] 07/18/2017 7:06 PM

## 2017-07-18 NOTE — ED Triage Notes (Signed)
Pt sent here from PCP to "get checked out". C/o increased SOB. Pt on 2-4L home O2. Reports increased SOB with ambulation. O2 89% in triage on home oxygen concentrator

## 2017-07-18 NOTE — ED Notes (Signed)
[facility] arrived to transport pt 

## 2017-07-18 NOTE — Patient Instructions (Signed)
Go straight to med center [place] right now. Continue current oxygen level.

## 2017-07-18 NOTE — ED Provider Notes (Signed)
[facility] EMERGENCY DEPARTMENT Provider Note   CSN: [id number] Arrival date & time: 07/18/17  4627     History   Chief Complaint Chief Complaint  Patient presents with  . Shortness of Breath    HPI [name] is a 81 y.o. male.  HPI  Shortness of breath, become severe last night, felt like couldn't breath. Took double dose nebulizer, increased O2 from 2-4 and felt better.  No increase in cough, no wheezing.  No leg pain or swelling.  Reports feels short of breath laying down on back, has to lay on side instead, been that way for years.  No chest pain, no nausea or vomiting.  No fever.    Just laying in bed now feeling short of breath, can't walk right now given severe dyspnea. Normally able to walk 20 feet.  Has been checking weight and weight has not changed. Shortness of breath is severe.  Past Medical History:  Diagnosis Date  . Adrenal adenoma   . Atrial fibrillation (HCC)    Rate control + Coumadin  . Atrial flutter ([place])   . CAD (coronary artery disease)    Lexiscan Myoview (01/2014): Lateral soft tissue attenuation, no ischemia, not gated, low risk  . CAP (community acquired pneumonia)    Hospitalized 10/2014  . Chronic hypoxemic respiratory failure (HCC)    from COPD  . Chronic renal insufficiency, stage III (moderate) (HCC)    CrCl 50s  . COPD (chronic obstructive pulmonary disease) (HCC)    oxygen-dependent (2.5 L 24/7).  Improved on starting chronic prednisone 06/2017.  . Diabetes mellitus without complication ([place]) 03/50/0938   A1c 6.5 % 05/2016  . Diverticulosis   . Dyslipidemia   . Dyspnea    Multifactorial: deconditioning, COPD, obesity hypoventilation syndrome.  . Embolic stroke ([place])    d/t endocarditis 2008  . Hemorrhoids   . History of mitral valve replacement    needs SBE prophylaxis  . History of prostate cancer remote past   f/u by Dr. Claris Che; no recurrence as of f/u 10/2015.  PSA rising from 2013 to 06/2016: as of 02/2017 plan  is bone scan (CT if PSA gets above 5).  Marland Kitchen HTN (hypertension)   . Mediastinal lymphadenopathy   . Pulmonary hypertension ([place]) 01/09/2015   secondary  . Right thyroid nodule   . Thrombocytopenia ([place])   . TIA (transient ischemic attack) 03/25/2011   ? of due to transient diplopia  . Ulcerative colitis    left-sided/segmental, associated with diverticulosis.  Sulfasalazine per GI (Dr. Carlean Purl).  . Warthin's tumor     Patient Active Problem List   Diagnosis Date Noted  . Acute respiratory failure with hypoxia ([place]) 07/18/2017  . Pruritus 09/02/2015  . Maxillary sinusitis 08/17/2015  . Eye irritation 08/17/2015  . Chronic hypoxemic respiratory failure ([place]) 07/17/2015  . Pulmonary hypertension ([place]) 01/09/2015  . Anxiety state 11/28/2014  . SOB (shortness of breath) 11/28/2014  . Cerebrovascular disease 11/20/2014  . Emotional lability 11/11/2014  . Tachycardia 11/04/2014  . Encounter for therapeutic drug monitoring 03/18/2014  . Atrial fibrillation ([place]) 07/25/2011  . Long term current use of anticoagulant 10/20/2010  . Essential hypertension, benign 08/20/2009  . HYPERLIPIDEMIA, FAMILIAL 08/03/2009  . Coronary atherosclerosis 08/03/2009  . MITRAL VALVE REPLACEMENT, HX OF 08/03/2009  . THYROID NODULE, RIGHT 12/03/2008  . CEREBROVASCULAR ACCIDENT, HX OF 12/03/2008  . COPD (chronic obstructive pulmonary disease) with emphysema ([place]) 06/09/2008  . Segmental colitis associated with sigmoid diverticulosis 12/12/2007  . PROSTATE CANCER,  HX OF 01/12/2007  . ENDOCARDITIS 07/04/2006    Class: History of    Past Surgical History:  Procedure Laterality Date  . carotid duplex dopplers  04/2014   No signif plaques; repeat 2 yrs per cardiology  . CATARACT EXTRACTION, BILATERAL    . COLONOSCOPY W/ BIOPSIES  12/19/2007   left-sided colitis, diverticulosis, hemorrhoids  . FLEXIBLE SIGMOIDOSCOPY  01/11/2008; 03/19/2010   2009 and 2011:segmental left colitis, diverticulosis, hemorrhoids  .  INGUINAL HERNIA REPAIR     right  . MITRAL VALVE REPAIR  09/22/04   and modified Cox-Maze IV   . MITRAL VALVE REPAIR  09/14/05   redo MV repair d/t endocardiits/embolic events  . PROSTATECTOMY  06/2003   Radical  . RIGHT HEART CATHETERIZATION N/A 01/01/2015   Procedure: RIGHT HEART CATH;  Surgeon: Peter M Martinique, MD;  Location: [facility] CATH LAB;  Service: Cardiovascular;  Laterality: N/A;  . SALIVARY GLAND SURGERY     left resection  . TRANSTHORACIC ECHOCARDIOGRAM  11/2013   Mod LVH, EF 55-60%, no WM abnormalities, +LA dilation, +severe RV dilation and impaired systolic fxn, +increased pulm art pressures.  Valves ok.       Home Medications    Prior to Admission medications   Medication Sig Start Date End Date Taking? Authorizing Provider  albuterol (PROVENTIL HFA;VENTOLIN HFA) 108 (90 Base) MCG/ACT inhaler Inhale 1 puff into the lungs every 6 (six) hours as needed for wheezing or shortness of breath (using 5-6 times every 24 hrs). 07/19/16   Collene Gobble, MD  budesonide (PULMICORT) 0.5 MG/2ML nebulizer solution USE ONE VIAL IN NEBULIZER TWICE DAILY 08/08/16   Collene Gobble, MD  digoxin (LANOXIN) 0.125 MG tablet Take 1 tablet (0.125 mg total) by mouth daily. 12/13/16   Lelon Perla, MD  diltiazem (CARDIZEM CD) 240 MG 24 hr capsule Take 1 capsule (240 mg total) by mouth daily. 12/13/16   Lelon Perla, MD  furosemide (LASIX) 40 MG tablet Take one (1) tablet by mouth alternating with half (1/2) tablet by mouth daily. 12/19/16   Lelon Perla, MD  LORazepam (ATIVAN) 0.5 MG tablet Take 1 tablet (0.5 mg total) by mouth 2 (two) times daily as needed for anxiety. 01/16/17   McGowen, Adrian Blackwater, MD  olopatadine (PATANOL) 0.1 % ophthalmic solution Place 1 drop into both eyes 2 (two) times daily. 08/17/15   Kuneff, Renee A, DO  pravastatin (PRAVACHOL) 40 MG tablet Take 1 tablet (40 mg total) by mouth every evening. 12/13/16   Lelon Perla, MD  predniSONE (DELTASONE) 20 MG tablet Take 1  tablet (20 mg total) by mouth daily with breakfast. 05/18/17   Collene Gobble, MD  predniSONE (DELTASONE) 5 MG tablet Take 1 tablet (5 mg total) by mouth daily with breakfast. 06/20/17   Collene Gobble, MD  Tiotropium Bromide-Olodaterol (STIOLTO RESPIMAT) 2.5-2.5 MCG/ACT AERS Inhale 2 puffs into the lungs daily. 08/08/16   Collene Gobble, MD  warfarin (COUMADIN) 5 MG tablet Take 1 to 1.5 tablets by mouth daily as directed by coumadin clinic 05/12/17   Lelon Perla, MD    Family History Family History  Problem Relation Age of Onset  . Cancer Mother        spinal  . Stroke Father   . Heart disease Father   . Colon cancer Neg Hx   . Heart attack Neg Hx     Social History Social History  Substance Use Topics  . Smoking status: Former Smoker  Packs/day: 3.00    Years: 60.00    Types: Cigarettes    Quit date: 09/19/2002  . Smokeless tobacco: Never Used  . Alcohol use No     Allergies   Patient has no known allergies.   Review of Systems Review of Systems  Constitutional: Negative for fever.  HENT: Negative for sore throat.   Eyes: Negative for visual disturbance.  Respiratory: Positive for cough (unchanged) and shortness of breath.   Cardiovascular: Negative for chest pain and leg swelling.  Gastrointestinal: Negative for abdominal pain, nausea and vomiting.  Genitourinary: Negative for difficulty urinating and dysuria.  Musculoskeletal: Negative for back pain and neck stiffness.  Skin: Negative for rash.  Neurological: Negative for syncope and headaches.     Physical Exam Updated Vital Signs BP 123/82   Pulse (!) 103   Temp 98.3 F (36.8 C) (Oral)   Resp 16   Ht 5' 5.5" (1.664 m)   Wt 87.1 kg (192 lb)   SpO2 93%   BMI 31.46 kg/m   Physical Exam  Constitutional: He is oriented to person, place, and time. He appears well-developed and well-nourished. No distress.  HENT:  Head: Normocephalic and atraumatic.  Eyes: Conjunctivae and EOM are normal.  Neck:  Normal range of motion.  Cardiovascular: Normal rate, normal heart sounds and intact distal pulses.  An irregularly irregular rhythm present. Exam reveals no gallop and no friction rub.   No murmur heard. Pulmonary/Chest: Effort normal. Tachypnea (mild tachypnea with conversation) noted. No respiratory distress. He has decreased breath sounds (all fields). He has wheezes (occasional). He has rales (bibasilar).  Abdominal: Soft. He exhibits no distension. There is no tenderness. There is no guarding.  Musculoskeletal: He exhibits edema (bilateral sock lines, trace edema).  Neurological: He is alert and oriented to person, place, and time.  Skin: Skin is warm and dry. Capillary refill takes less than 2 seconds. He is not diaphoretic.  Nursing note and vitals reviewed.    ED Treatments / Results  Labs (all labs ordered are listed, but only abnormal results are displayed) Labs Reviewed  CBC WITH DIFFERENTIAL/PLATELET - Abnormal; Notable for the following:       Result Value   RDW 16.3 (*)    Monocytes Absolute 1.2 (*)    All other components within normal limits  COMPREHENSIVE METABOLIC PANEL - Abnormal; Notable for the following:    Glucose, Bld 104 (*)    Creatinine, Ser 1.35 (*)    ALT 16 (*)    GFR calc non Af Amer 48 (*)    GFR calc Af Amer 56 (*)    All other components within normal limits  TROPONIN I - Abnormal; Notable for the following:    Troponin I 0.05 (*)    All other components within normal limits  PROTIME-INR - Abnormal; Notable for the following:    Prothrombin Time 23.8 (*)    All other components within normal limits  DIGOXIN LEVEL - Abnormal; Notable for the following:    Digoxin Level <0.2 (*)    All other components within normal limits  TROPONIN I - Abnormal; Notable for the following:    Troponin I 0.04 (*)    All other components within normal limits  I-STAT CHEM 8, ED - Abnormal; Notable for the following:    Chloride 99 (*)    Glucose, Bld 105 (*)     All other components within normal limits  CULTURE, BLOOD (ROUTINE X 2)  CULTURE, BLOOD (ROUTINE X 2)  RESPIRATORY PANEL BY PCR  BRAIN NATRIURETIC PEPTIDE  PROCALCITONIN  TROPONIN I  TROPONIN I  I-STAT CG4 LACTIC ACID, ED    EKG  EKG Interpretation  Date/Time:  Tuesday July 18 2017 09:39:55 EDT Ventricular Rate:  98 PR Interval:    QRS Duration: 100 QT Interval:  358 QTC Calculation: 457 R Axis:   162 Text Interpretation:  Atrial fibrillation Right axis deviation Right ventricular hypertrophy Nonspecific ST abnormality Abnormal ECG Since prior ECG, less PVCs present, no other significant abnormalities Confirmed by Gareth Morgan [phone]) on 07/18/2017 11:28:43 AM       Radiology Dg Chest 2 View  Result Date: 07/18/2017 CLINICAL DATA:  Increase shortness of breath last night. Chronic shortness of breath on home oxygen. History of COPD, atrial fibrillation, prostate malignancy, valvular heart disease, former smoker. EXAM: CHEST  2 VIEW COMPARISON:  Chest x-ray of January 03, 2017 FINDINGS: The lungs are well-expanded. There is confluent alveolar opacity in the right infrahilar region more conspicuous than in the past. Overall the interstitial markings are more conspicuous in the pulmonary vascularity more engorged. There is no significant pleural effusion. The cardiac silhouette is top-normal in size. There is calcification in the wall of the aortic arch. A prosthetic mitral valve ring is visible. IMPRESSION: CHF superimposed upon COPD. There may be atelectasis or early pneumonia developing in the right lower lobe. Followup PA and lateral chest X-ray is recommended in 3-4 weeks following trial of antibiotic therapy to ensure resolution and exclude underlying malignancy. Thoracic aortic atherosclerosis. Electronically Signed   By: David  Martinique M.D.   On: 07/18/2017 10:01    Procedures Procedures (including critical care time)  Medications Ordered in ED Medications    ipratropium-albuterol (DUONEB) 0.5-2.5 (3) MG/3ML nebulizer solution 3 mL (3 mLs Nebulization Given 07/18/17 1609)  LORazepam (ATIVAN) tablet 0.5 mg (0.5 mg Oral Given 07/18/17 1507)  levofloxacin (LEVAQUIN) tablet 750 mg (750 mg Oral Given 07/18/17 1115)  furosemide (LASIX) injection 40 mg (40 mg Intravenous Given 07/18/17 1110)  methylPREDNISolone sodium succinate (SOLU-MEDROL) 125 mg/2 mL injection 125 mg (125 mg Intravenous Given 07/18/17 1110)  aspirin chewable tablet 324 mg (324 mg Oral Given 07/18/17 1315)   CRITICAL CARE: elevated troponin, transfer to cardiac center Performed by: Tennis Must   Total critical care time: 30 minutes  Critical care time was exclusive of separately billable procedures and treating other patients.  Critical care was necessary to treat or prevent imminent or life-threatening deterioration.  Critical care was time spent personally by me on the following activities: development of treatment plan with patient and/or surrogate as well as nursing, discussions with consultants, evaluation of patient's response to treatment, examination of patient, obtaining history from patient or surrogate, ordering and performing treatments and interventions, ordering and review of laboratory studies, ordering and review of radiographic studies, pulse oximetry and re-evaluation of patient's condition.   Initial Impression / Assessment and Plan / ED Course  I have reviewed the triage vital signs and the nursing notes.  Pertinent labs & imaging results that were available during my care of the patient were reviewed by me and considered in my medical decision making (see chart for details).    81 year old male with a history of coronary artery disease, atrial fibrillation on Coumadin, COPD on 2 L of oxygen at home, diabetes, hyperlipidemia, CVA remote prostate cancer, ulcerative colitis, presents with concern for shortness of breath beginning last night.  Chest x-ray  shows superimposed edema on COPD, with asymmetry and increased opacity  in right side.  Radiologist recommending follow-up x-ray in 3-4 weeks, which was discussed with the patient and family.  He does have some rales on exam, mild edema, describe some orthopnea as well as dyspnea at rest, and suspect CHF is likely contributor to his dyspnea.  In addition, suspect concomitant COPD exacerbation, given decreased breath sounds and wheezing.  He denies fever or significantly increased cough, has no leukocytosis, and overall have low suspicion for pneumonia, however given asymmetry seen on chest x-ray, was given p.o. Levaquin.  There are no signs of sepsis, lactic acid is within normal limits.  His INR is therapeutic, and have low suspicion for pulmonary embolus at this time given other recommendations for dyspnea, therapeutic INR.  His troponin is mildly elevated to 0.05, repeated at 0.04.  Suspect this is likely secondary to strain, and at this time have low suspicion for ACS, however we will continue to monitor.  He was given aspirin 325mg , IV Lasix 40 mg, IV Solu-Medrol, duo nebs with some improvement of his symptoms however continuing dyspnea. Will admit for continued care for cardiac rule out in setting of positive troponins as well as likely multifactorial dyspnea. Patient awaiting bed at [facility]. Dr. Marily Memos accepting.    Final Clinical Impressions(s) / ED Diagnoses   Final diagnoses:  COPD exacerbation ([place])  Acute on chronic congestive heart failure, unspecified heart failure type ([place])  Elevated troponin    New Prescriptions New Prescriptions   No medications on file     Gareth Morgan, MD 07/18/17 1708

## 2017-07-18 NOTE — ED Notes (Signed)
Pt was standing at bedside to void and pt SpO2 dropped to 86% on 2L O2. Pt mildly labored. Pt returned to bed and O2 increased to 3L. SpO2 now 92%. Pt instructed not to get up out of the bed at this time.

## 2017-07-18 NOTE — ED Notes (Signed)
Patient transported to X-ray 

## 2017-07-18 NOTE — H&P (Signed)
History and Physical    YUREM VINER ASN:[id number] DOB: 06-Oct-1935 DOA: 07/18/2017  PCP: Tammi Sou, MD   Patient coming from: Home  I have personally briefly reviewed patient's old medical records in [place]  Chief Complaint: Shortness of breath  HPI: BRYCE CHEEVER is a 81 y.o. 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 Patient states that he woke up last night with shortness of breath which is been getting worse. No complains of chest pain, palpitations. No increase in cough or sputum production or wheezing. Patient denies any increase or decrease urine output or increasing leg pain or swelling. No complains of fever, nausea, vomiting, chills. Patient states that he takes Lasix at home intermittently and skips few doses per week.  ED Course: Patient had a chest x-ray done in the ED and his troponin was very mildly elevated. He was given IV Lasix along with IV Solu-Medrol and oral Levaquin. Hospitalist service was called to admit the patient and patient was transferred to [facility] from [place]: As per HPI otherwise 10 point review of systems negative.   Past Medical History:  Diagnosis Date  . Adrenal adenoma   . Atrial fibrillation (HCC)    Rate control + Coumadin  . Atrial flutter ([place])   . CAD (coronary artery disease)    Lexiscan Myoview (01/2014): Lateral soft tissue attenuation, no ischemia, not gated, low risk  . CAP (community acquired pneumonia)    Hospitalized 10/2014  . Chronic  hypoxemic respiratory failure (HCC)    from COPD  . Chronic renal insufficiency, stage III (moderate) (HCC)    CrCl 50s  . COPD (chronic obstructive pulmonary disease) (HCC)    oxygen-dependent (2.5 L 24/7).  Improved on starting chronic prednisone 06/2017.  . Diabetes mellitus without complication ([place]) 19/37/9024   A1c 6.5 % 05/2016  . Diverticulosis   . Dyslipidemia   . Dyspnea    Multifactorial: deconditioning, COPD, obesity hypoventilation syndrome.  . Embolic stroke ([place])    d/t endocarditis 2008  . Hemorrhoids   . History of mitral valve replacement    needs SBE prophylaxis  . History of prostate cancer remote past   f/u by Dr. Claris Che; no recurrence as of f/u 10/2015.  PSA rising from 2013 to 06/2016: as of 02/2017 plan is bone scan (CT if PSA gets above 5).  Marland Kitchen HTN (hypertension)   . Mediastinal lymphadenopathy   . Pulmonary hypertension ([place]) 01/09/2015   secondary  . Right thyroid nodule   . Thrombocytopenia ([place])   . TIA (transient ischemic attack) 03/25/2011   ? of due to transient diplopia  . Ulcerative colitis    left-sided/segmental, associated with diverticulosis.  Sulfasalazine per GI (Dr. Carlean Purl).  . Warthin's tumor     Past Surgical History:  Procedure Laterality Date  . carotid duplex dopplers  04/2014   No signif plaques; repeat 2 yrs per cardiology  . CATARACT EXTRACTION, BILATERAL    . COLONOSCOPY W/ BIOPSIES  12/19/2007   left-sided colitis, diverticulosis, hemorrhoids  . FLEXIBLE SIGMOIDOSCOPY  01/11/2008; 03/19/2010   2009 and 2011:segmental left colitis, diverticulosis, hemorrhoids  .  INGUINAL HERNIA REPAIR     right  . MITRAL VALVE REPAIR  09/22/04   and modified Cox-Maze IV   . MITRAL VALVE REPAIR  09/14/05   redo MV repair d/t endocardiits/embolic events  . PROSTATECTOMY  06/2003   Radical  . RIGHT HEART CATHETERIZATION N/A 01/01/2015   Procedure: RIGHT HEART CATH;  Surgeon: Peter M Martinique, MD;  Location: [facility] C[facility] LAB;  Service: Cardiovascular;   Laterality: N/A;  . SALIVARY GLAND SURGERY     left resection  . TRANSTHORACIC ECHOCARDIOGRAM  11/2013   Mod LVH, EF 55-60%, no WM abnormalities, +LA dilation, +severe RV dilation and impaired systolic fxn, +increased pulm art pressures.  Valves ok.   Social history  reports that he quit smoking about 14 years ago. His smoking use included Cigarettes. He has a 180.00 pack-year smoking history. He has never used smokeless tobacco. He reports that he does not drink alcohol or use drugs.  No Known Allergies  Family History  Problem Relation Age of Onset  . Cancer Mother        spinal  . Stroke Father   . Heart disease Father   . Colon cancer Neg Hx   . Heart attack Neg Hx     Prior to Admission medications   Medication Sig Start Date End Date Taking? Authorizing Provider  albuterol (PROVENTIL HFA;VENTOLIN HFA) 108 (90 Base) MCG/ACT inhaler Inhale 1 puff into the lungs every 6 (six) hours as needed for wheezing or shortness of breath (using 5-6 times every 24 hrs). 07/19/16   Collene Gobble, MD  budesonide (PULMICORT) 0.5 MG/2ML nebulizer solution USE ONE VIAL IN NEBULIZER TWICE DAILY 08/08/16   Collene Gobble, MD  digoxin (LANOXIN) 0.125 MG tablet Take 1 tablet (0.125 mg total) by mouth daily. 12/13/16   Lelon Perla, MD  diltiazem (CARDIZEM CD) 240 MG 24 hr capsule Take 1 capsule (240 mg total) by mouth daily. 12/13/16   Lelon Perla, MD  furosemide (LASIX) 40 MG tablet Take one (1) tablet by mouth alternating with half (1/2) tablet by mouth daily. 12/19/16   Lelon Perla, MD  LORazepam (ATIVAN) 0.5 MG tablet Take 1 tablet (0.5 mg total) by mouth 2 (two) times daily as needed for anxiety. 01/16/17   McGowen, Adrian Blackwater, MD  olopatadine (PATANOL) 0.1 % ophthalmic solution Place 1 drop into both eyes 2 (two) times daily. 08/17/15   Kuneff, Renee A, DO  pravastatin (PRAVACHOL) 40 MG tablet Take 1 tablet (40 mg total) by mouth every evening. 12/13/16   Lelon Perla, MD    predniSONE (DELTASONE) 20 MG tablet Take 1 tablet (20 mg total) by mouth daily with breakfast. 05/18/17   Collene Gobble, MD  predniSONE (DELTASONE) 5 MG tablet Take 1 tablet (5 mg total) by mouth daily with breakfast. 06/20/17   Collene Gobble, MD  Tiotropium Bromide-Olodaterol (STIOLTO RESPIMAT) 2.5-2.5 MCG/ACT AERS Inhale 2 puffs into the lungs daily. 08/08/16   Collene Gobble, MD  warfarin (COUMADIN) 5 MG tablet Take 1 to 1.5 tablets by mouth daily as directed by coumadin clinic 05/12/17   Lelon Perla, MD    Physical Exam: Vitals:   07/18/17 1600 07/18/17 1609 07/18/17 1630 07/18/17 1801  BP: 122/72  123/82 121/66  Pulse:   (!) 103 73  Resp:    18  Temp:    (!) 97.2 F (36.2 C)  TempSrc:    Oral  SpO2: 94% 92% 93% 99%  Weight:  84.7 kg (186 lb 11.2 oz)  Height:    5' 5.5" (1.664 m)    Constitutional: Elderly male lying in bed,NAD, calm, comfortable Vitals:   07/18/17 1600 07/18/17 1609 07/18/17 1630 07/18/17 1801  BP: 122/72  123/82 121/66  Pulse:   (!) 103 73  Resp:    18  Temp:    (!) 97.2 F (36.2 C)  TempSrc:    Oral  SpO2: 94% 92% 93% 99%  Weight:    84.7 kg (186 lb 11.2 oz)  Height:    5' 5.5" (1.664 m)   Eyes: PERRL, no pallor or icterus ENMT: Mucous membranes are moist. No posterior pharyngeal wall erythema or congestion  Neck: normal, supple, no masses, no thyromegaly Respiratory: Bilateral decreased breath sounds at bases with some scattered crackles. No current wheezing. Not tachypneic currently  Cardiovascular: S1-S2 positive, intermittent tachycardia. No murmurs. 1+ lower extremity edema Abdomen: no tenderness, no masses palpated. No hepatosplenomegaly. Bowel sounds positive.  Musculoskeletal: no clubbing / cyanosis. No visual deformities or tenderness  Skin: no rashes, lesions, ulcers.  Neurologic: CN 2-12 grossly intact. Moving extremities. Alert, awake and oriented.  Psychiatric: Normal judgment and insight. Alert and oriented x 3. Normal mood.     Labs on Admission: I have personally reviewed following labs and imaging studies  CBC:  Recent Labs Lab 07/18/17 1007 07/18/17 1014  WBC 9.1  --   NEUTROABS 5.8  --   HGB 13.8 15.0  HCT 42.4 44.0  MCV 87.8  --   PLT 181  --    Basic Metabolic Panel:  Recent Labs Lab 07/18/17 1007 07/18/17 1014  NA 135 137  K 3.9 3.9  CL 102 99*  CO2 22  --   GLUCOSE 104* 105*  BUN 12 14  CREATININE 1.35* 1.20  CALCIUM 9.0  --    GFR: Estimated Creatinine Clearance: 50.5 mL/min (by C-G formula based on SCr of 1.2 mg/dL). Liver Function Tests:  Recent Labs Lab 07/18/17 1007  AST 18  ALT 16*  ALKPHOS 60  BILITOT 1.2  PROT 6.6  ALBUMIN 3.5   No results for input(s): LIPASE, AMYLASE in the last 168 hours. No results for input(s): AMMONIA in the last 168 hours. Coagulation Profile:  Recent Labs Lab 07/18/17 1007  INR 2.15   Cardiac Enzymes:  Recent Labs Lab 07/18/17 1007 07/18/17 1407  TROPONINI 0.05* 0.04*   BNP (last 3 results) No results for input(s): PROBNP in the last 8760 hours. HbA1C: No results for input(s): HGBA1C in the last 72 hours. CBG: No results for input(s): GLUCAP in the last 168 hours. Lipid Profile: No results for input(s): CHOL, HDL, LDLCALC, TRIG, CHOLHDL, LDLDIRECT in the last 72 hours. Thyroid Function Tests: No results for input(s): TSH, T4TOTAL, FREET4, T3FREE, THYROIDAB in the last 72 hours. Anemia Panel: No results for input(s): VITAMINB12, FOLATE, FERRITIN, TIBC, IRON, RETICCTPCT in the last 72 hours. Urine analysis:    Component Value Date/Time   COLORURINE YELLOW 11/06/2014 [place] 11/06/2014 0851   LABSPEC 1.017 11/06/2014 0851   PHURINE 5.0 11/06/2014 0851   GLUCOSEU NEGATIVE 11/06/2014 0851   HGBUR SMALL (A) 11/06/2014 0851   BILIRUBINUR small 11/27/2014 1025   KETONESUR NEGATIVE 11/06/2014 0851   PROTEINUR 100 11/27/2014 1025   PROTEINUR NEGATIVE 11/06/2014 0851   UROBILINOGEN 4.0 11/27/2014 1025    UROBILINOGEN 0.2 11/06/2014 0851   NITRITE negative 11/27/2014 1025   NITRITE NEGATIVE 11/06/2014 0851   LEUKOCYTESUR Negative 11/27/2014 1025  Radiological Exams on Admission: Dg Chest 2 View  Result Date: 07/18/2017 CLINICAL DATA:  Increase shortness of breath last night. Chronic shortness of breath on home oxygen. History of COPD, atrial fibrillation, prostate malignancy, valvular heart disease, former smoker. EXAM: CHEST  2 VIEW COMPARISON:  Chest x-ray of January 03, 2017 FINDINGS: The lungs are well-expanded. There is confluent alveolar opacity in the right infrahilar region more conspicuous than in the past. Overall the interstitial markings are more conspicuous in the pulmonary vascularity more engorged. There is no significant pleural effusion. The cardiac silhouette is top-normal in size. There is calcification in the wall of the aortic arch. A prosthetic mitral valve ring is visible. IMPRESSION: CHF superimposed upon COPD. There may be atelectasis or early pneumonia developing in the right lower lobe. Followup PA and lateral chest X-ray is recommended in 3-4 weeks following trial of antibiotic therapy to ensure resolution and exclude underlying malignancy. Thoracic aortic atherosclerosis. Electronically Signed   By: David  Martinique M.D.   On: 07/18/2017 10:01     Assessment/Plan Active Problems:   Essential hypertension, benign   MITRAL VALVE REPLACEMENT, HX OF   Long term current use of anticoagulant   Atrial fibrillation (HCC)   Chronic hypoxemic respiratory failure (HCC)   COPD exacerbation (HCC)   Acute on chronic congestive heart failure (HCC)   COPD with acute exacerbation (HCC)   Probable COPD exacerbation - Patient received intravenous Solu Medrol in the ED. Continue Solu Medrol IV 40 mg every 8 hours. Continue duonebs and pulmicort nebulizer. - Continue oxygen supplementation - Check influenza and respiratory virus PCR - Chest x-ray on admission is unclear if there  might be a right lower lobe pneumonia. Levaquin 500 mg daily orally be continued. Follow cultures. Patient does not complain of increasing cough or fever. Urine Legionella and streptococcal antigens - Repeat chest x-ray in a.m. - Continue oxygen supplementation. If respiratory status does not improve, we will get CAT scan of the chest  Acute on chronic diastolic decompensated heart failure - Lasix 40 mg IV every 12 hours. Strict input and output, daily weights. 2-D echo. Cardiology evaluation if needed - Continue digoxin. Check digoxin level in a.m.  Positive troponins - Probably secondary to demand ischemia from above. Cycle troponins. 2-D echo. Patient denies any chest pain  Chronic hypoxic respiratory failure - Continue oxygen supplementation  Paroxysmal atrial fibrillation on chronic anticoagulation - Continue Coumadin, dose as per pharmacy. Continue Cardizem and digoxin. Currently intermittently tachycardic  Chronic kidney disease stage II - Monitor renal function  Hypertension - Monitor blood pressure. Continue Cardizem   DVT prophylaxis: Coumadin Code Status: Full  Family Communication: None at bedside  Disposition Plan: Home after clinical improvement  Consults called: None Admission status: Inpatient telemetry  Severity of Illness: The appropriate patient status for this patient is INPATIENT. Inpatient status is judged to be reasonable and necessary in order to provide the required intensity of service to ensure the patient's safety. The patient's presenting symptoms, physical exam findings, and initial radiographic and laboratory data in the context of their chronic comorbidities is felt to place them at high risk for further clinical deterioration. Furthermore, it is not anticipated that the patient will be medically stable for discharge from the hospital within 2 midnights of admission. The following factors support the patient status of inpatient.   " The patient's  presenting symptoms include shortness of breath. " The worrisome physical exam findings include crackles, mild lower extremity edema. " The initial radiographic and laboratory  data are worrisome because of positive troponin, abnormal chest x-ray. " The chronic co-morbidities include atrial fibrillation, COPD, chronic hypoxic respiratory failure.   * I certify that at the point of admission it is my clinical judgment that the patient will require inpatient hospital care spanning beyond 2 midnights from the point of admission due to high intensity of service, high risk for further deterioration and high frequency of surveillance required.Aline August MD [facility] Pager [phone]  If 7PM-7AM, please contact night-coverage [email or web address] Password [email or web address]  07/18/2017, 6:26 PM

## 2017-07-19 ENCOUNTER — Inpatient Hospital Stay (HOSPITAL_COMMUNITY): Payer: Medicare Other

## 2017-07-19 DIAGNOSIS — J449 Chronic obstructive pulmonary disease, unspecified: Secondary | ICD-10-CM | POA: Diagnosis not present

## 2017-07-19 DIAGNOSIS — I130 Hypertensive heart and chronic kidney disease with heart failure and stage 1 through stage 4 chronic kidney disease, or unspecified chronic kidney disease: Secondary | ICD-10-CM | POA: Diagnosis not present

## 2017-07-19 DIAGNOSIS — I361 Nonrheumatic tricuspid (valve) insufficiency: Secondary | ICD-10-CM | POA: Diagnosis not present

## 2017-07-19 DIAGNOSIS — N183 Chronic kidney disease, stage 3 (moderate): Secondary | ICD-10-CM | POA: Diagnosis not present

## 2017-07-19 DIAGNOSIS — I248 Other forms of acute ischemic heart disease: Secondary | ICD-10-CM | POA: Diagnosis not present

## 2017-07-19 DIAGNOSIS — J441 Chronic obstructive pulmonary disease with (acute) exacerbation: Principal | ICD-10-CM

## 2017-07-19 DIAGNOSIS — J9621 Acute and chronic respiratory failure with hypoxia: Secondary | ICD-10-CM | POA: Diagnosis not present

## 2017-07-19 DIAGNOSIS — I5033 Acute on chronic diastolic (congestive) heart failure: Secondary | ICD-10-CM

## 2017-07-19 DIAGNOSIS — R0602 Shortness of breath: Secondary | ICD-10-CM | POA: Diagnosis not present

## 2017-07-19 DIAGNOSIS — J9611 Chronic respiratory failure with hypoxia: Secondary | ICD-10-CM | POA: Diagnosis not present

## 2017-07-19 LAB — PROTIME-INR
INR: 2.25
Prothrombin Time: 24.7 seconds — ABNORMAL HIGH (ref 11.4–15.2)

## 2017-07-19 LAB — BLOOD CULTURE ID PANEL (REFLEXED)
Acinetobacter baumannii: NOT DETECTED
CANDIDA PARAPSILOSIS: NOT DETECTED
CANDIDA TROPICALIS: NOT DETECTED
Candida albicans: NOT DETECTED
Candida glabrata: NOT DETECTED
Candida krusei: NOT DETECTED
Enterobacter cloacae complex: NOT DETECTED
Enterobacteriaceae species: NOT DETECTED
Enterococcus species: NOT DETECTED
Escherichia coli: NOT DETECTED
HAEMOPHILUS INFLUENZAE: NOT DETECTED
KLEBSIELLA PNEUMONIAE: NOT DETECTED
Klebsiella oxytoca: NOT DETECTED
Listeria monocytogenes: NOT DETECTED
METHICILLIN RESISTANCE: NOT DETECTED
NEISSERIA MENINGITIDIS: NOT DETECTED
PROTEUS SPECIES: NOT DETECTED
Pseudomonas aeruginosa: NOT DETECTED
SERRATIA MARCESCENS: NOT DETECTED
STAPHYLOCOCCUS SPECIES: DETECTED — AB
STREPTOCOCCUS SPECIES: NOT DETECTED
Staphylococcus aureus (BCID): NOT DETECTED
Streptococcus agalactiae: NOT DETECTED
Streptococcus pneumoniae: NOT DETECTED
Streptococcus pyogenes: NOT DETECTED

## 2017-07-19 LAB — COMPREHENSIVE METABOLIC PANEL
ALBUMIN: 3.4 g/dL — AB (ref 3.5–5.0)
ALT: 17 U/L (ref 17–63)
ANION GAP: 12 (ref 5–15)
AST: 18 U/L (ref 15–41)
Alkaline Phosphatase: 56 U/L (ref 38–126)
BUN: 18 mg/dL (ref 6–20)
CALCIUM: 9.6 mg/dL (ref 8.9–10.3)
CO2: 22 mmol/L (ref 22–32)
Chloride: 101 mmol/L (ref 101–111)
Creatinine, Ser: 1.45 mg/dL — ABNORMAL HIGH (ref 0.61–1.24)
GFR calc non Af Amer: 44 mL/min — ABNORMAL LOW (ref >60)
GFR, EST AFRICAN AMERICAN: 51 mL/min — AB (ref >60)
GLUCOSE: 167 mg/dL — AB (ref 65–99)
POTASSIUM: 4.1 mmol/L (ref 3.5–5.1)
SODIUM: 135 mmol/L (ref 135–145)
TOTAL PROTEIN: 6.8 g/dL (ref 6.5–8.1)
Total Bilirubin: 1.2 mg/dL (ref 0.3–1.2)

## 2017-07-19 LAB — RESPIRATORY PANEL BY PCR
ADENOVIRUS-RVPPCR: NOT DETECTED
Bordetella pertussis: NOT DETECTED
CORONAVIRUS HKU1-RVPPCR: NOT DETECTED
CORONAVIRUS NL63-RVPPCR: NOT DETECTED
Chlamydophila pneumoniae: NOT DETECTED
Coronavirus 229E: NOT DETECTED
Coronavirus OC43: NOT DETECTED
Influenza A: NOT DETECTED
Influenza B: NOT DETECTED
METAPNEUMOVIRUS-RVPPCR: NOT DETECTED
Mycoplasma pneumoniae: NOT DETECTED
PARAINFLUENZA VIRUS 1-RVPPCR: NOT DETECTED
PARAINFLUENZA VIRUS 2-RVPPCR: NOT DETECTED
PARAINFLUENZA VIRUS 3-RVPPCR: NOT DETECTED
Parainfluenza Virus 4: NOT DETECTED
RHINOVIRUS / ENTEROVIRUS - RVPPCR: NOT DETECTED
Respiratory Syncytial Virus: NOT DETECTED

## 2017-07-19 LAB — CBC
HEMATOCRIT: 41.4 % (ref 39.0–52.0)
HEMOGLOBIN: 14 g/dL (ref 13.0–17.0)
MCH: 28.9 pg (ref 26.0–34.0)
MCHC: 33.8 g/dL (ref 30.0–36.0)
MCV: 85.4 fL (ref 78.0–100.0)
Platelets: 173 10*3/uL (ref 150–400)
RBC: 4.85 MIL/uL (ref 4.22–5.81)
RDW: 15.5 % (ref 11.5–15.5)
WBC: 11.2 10*3/uL — ABNORMAL HIGH (ref 4.0–10.5)

## 2017-07-19 LAB — TROPONIN I
Troponin I: 0.04 ng/mL (ref <0.03)
Troponin I: 0.04 ng/mL (ref <0.03)

## 2017-07-19 LAB — DIGOXIN LEVEL

## 2017-07-19 MED ORDER — ALBUTEROL SULFATE (2.5 MG/3ML) 0.083% IN NEBU: 3.0000 mL | INHALATION_SOLUTION | RESPIRATORY_TRACT | Status: DC | PRN

## 2017-07-19 MED ORDER — ORAL CARE MOUTH RINSE: 15.0000 mL | Freq: Two times a day (BID) | OROMUCOSAL | Status: DC

## 2017-07-19 MED ORDER — PREDNISONE 10 MG PO TABS: ORAL_TABLET | ORAL | 0 refills | Status: DC

## 2017-07-19 MED ORDER — WARFARIN SODIUM 5 MG PO TABS: 5.0000 mg | ORAL_TABLET | Freq: Every day | ORAL | Status: DC

## 2017-07-19 MED ORDER — IPRATROPIUM-ALBUTEROL 0.5-2.5 (3) MG/3ML IN SOLN
3.0000 mL | Freq: Three times a day (TID) | RESPIRATORY_TRACT | Status: DC
  Administered 2017-07-19: 3 mL via RESPIRATORY_TRACT
  Filled 2017-07-19: qty 3

## 2017-07-19 MED ORDER — PREDNISONE 5 MG PO TABS: ORAL_TABLET | ORAL | 2 refills | Status: DC

## 2017-07-19 NOTE — Evaluation (Signed)
Physical Therapy Evaluation Patient Details Name: [name] MRN: [id number] DOB: 08-Apr-1936 Today's Date: 07/19/2017   History of Present Illness  [name] is a 81 y.o. 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 Patient states that he woke up last night with shortness of breath which is been getting worse.   Clinical Impression  Pt admitted with above diagnosis. Pt currently with functional limitations due to the deficits listed below (see PT Problem List). Pt was able to ambulate in halls with overall fair safety but would benefit from RW as he could fall with challenges to balance. 17/24 on DGI reinforces need for RW.  Pt had home O2 prior to admit and will need to continue this noting that pt needed 4LO2 to keep sats > 90% with activity.   Will also benefit from HHPT f/u.   Pt will benefit from skilled PT to increase their independence and safety with mobility to allow discharge to the venue listed below.      Follow Up Recommendations Home health PT;Supervision/Assistance - 24 hour    Equipment Recommendations  Rolling walker with 5" wheels (Pt states he has one)    Recommendations for Other Services       Precautions / Restrictions Precautions Precautions: Fall Restrictions Weight Bearing Restrictions: No      Mobility  Bed Mobility Overal bed mobility: Independent                Transfers Overall transfer level: Independent                  Ambulation/Gait Ambulation/Gait assistance: Min guard Ambulation Distance  (Feet): 150 Feet Assistive device: None Gait Pattern/deviations: Step-through pattern;Decreased stride length;Drifts right/left   Gait velocity interpretation: Below normal speed for age/gender General Gait Details: Overall, pt ambulates well in controlled enviroment.  Only lost balance with challenges and pt did self correct even then.  Feel pt is at risk for falls with mod challenges to balance and informed pt as such and recommended he use RW intiailly at home.  Of note, pt also DOE 3/4 with notable SOB during and at end of walk.  Sats 90% on 4LO2 therefore had to incr from 2 - 4 to keep sats up.   Stairs            Wheelchair Mobility    Modified Rankin (Stroke Patients Only)       Balance Overall balance assessment: Needs assistance Sitting-balance support: No upper extremity supported;Feet supported Sitting balance-Leahy Scale: Fair     Standing balance support: No upper extremity supported;During functional activity Standing balance-Leahy Scale: Fair Standing balance comment: Pt was able to stand statically without UE support.                  Standardized Balance Assessment Standardized Balance Assessment : Dynamic Gait Index   Dynamic Gait Index Level Surface: Mild Impairment Change in Gait Speed: Mild Impairment Gait with Horizontal Head Turns: Mild Impairment Gait with Vertical Head Turns: Mild Impairment Gait and Pivot Turn: Mild Impairment Step Over Obstacle: Mild Impairment Step Around Obstacles: Normal Steps: Mild Impairment Total Score: 17  Pertinent Vitals/Pain Pain Assessment: No/denies pain    Home Living Family/patient expects to be discharged to:: Private residence Living Arrangements: Spouse/significant other Available Help at Discharge: Family;Available 24 hours/day Type of Home: House Home Access: Stairs to enter Entrance Stairs-Rails: Right Entrance Stairs-Number of Steps: 3 Home Layout: Two level Home Equipment: Cane -  single point (2Lhome O2) 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    Prior Function Level of Independence: Independent               Hand Dominance   Dominant Hand: Right    Extremity/Trunk Assessment   Upper Extremity Assessment Upper Extremity Assessment: Defer to OT evaluation    Lower Extremity Assessment Lower Extremity Assessment: Generalized weakness    Cervical / Trunk Assessment Cervical / Trunk Assessment: Normal  Communication   Communication: No difficulties  Cognition Arousal/Alertness: Awake/alert Behavior During Therapy: WFL for tasks assessed/performed Overall Cognitive Status: Within Functional Limits for tasks assessed                                        General Comments      Exercises     Assessment/Plan    PT Assessment Patient needs continued PT services  PT Problem List Decreased activity tolerance;Decreased balance;Decreased mobility;Decreased knowledge of use of DME;Decreased safety awareness;Decreased knowledge of precautions       PT Treatment Interventions DME instruction;Gait training;Functional mobility training;Therapeutic activities;Therapeutic exercise;Balance training;Patient/family education    PT Goals (Current goals can be found in the Care Plan section)  Acute Rehab PT Goals Patient Stated Goal: to go home PT Goal Formulation: With patient Time For Goal Achievement: 08/02/17 Potential to Achieve Goals: Good    Frequency Min 3X/week   Barriers to discharge        Co-evaluation               AM-PAC PT "6 Clicks" Daily Activity  Outcome Measure Difficulty turning over in bed (including adjusting bedclothes, sheets and blankets)?: None Difficulty moving from lying on back to sitting on the side of the bed? : None Difficulty sitting down on and standing up from a chair with arms (e.g., wheelchair, bedside commode, etc,.)?: None Help needed moving to and from  a bed to chair (including a wheelchair)?: None Help needed walking in hospital room?: A Little Help needed climbing 3-5 steps with a railing? : A Little 6 Click Score: 22    End of Session Equipment Utilized During Treatment: Gait belt;Oxygen Activity Tolerance: Patient limited by fatigue Patient left: with call bell/phone within reach;in bed;with family/visitor present (sitting on EOB) Nurse Communication: Mobility status PT Visit Diagnosis: Unsteadiness on feet (R26.81);Muscle weakness (generalized) (M62.81)    Time: 4656-8127 PT Time Calculation (min) (ACUTE ONLY): 31 min   Charges:   PT Evaluation $PT Eval Moderate Complexity: 1 Mod PT Treatments $Gait Training: 8-22 mins   PT G Codes:        Aurilla Coulibaly,PT Acute Rehabilitation [phone] (pager)   Denice Paradise 07/19/2017, 2:19 PM

## 2017-07-19 NOTE — Progress Notes (Signed)
PHARMACY - PHYSICIAN COMMUNICATION CRITICAL VALUE ALERT - BLOOD CULTURE IDENTIFICATION (BCID)  Results for orders placed or performed during the hospital encounter of 07/18/17  Blood Culture ID Panel (Reflexed) (Collected: 07/18/2017 11:00 AM)  Result Value Ref Range   Enterococcus species NOT DETECTED NOT DETECTED   Listeria monocytogenes NOT DETECTED NOT DETECTED   Staphylococcus species DETECTED (A) NOT DETECTED   Staphylococcus aureus NOT DETECTED NOT DETECTED   Methicillin resistance NOT DETECTED NOT DETECTED   Streptococcus species NOT DETECTED NOT DETECTED   Streptococcus agalactiae NOT DETECTED NOT DETECTED   Streptococcus pneumoniae NOT DETECTED NOT DETECTED   Streptococcus pyogenes NOT DETECTED NOT DETECTED   Acinetobacter baumannii NOT DETECTED NOT DETECTED   Enterobacteriaceae species NOT DETECTED NOT DETECTED   Enterobacter cloacae complex NOT DETECTED NOT DETECTED   Escherichia coli NOT DETECTED NOT DETECTED   Klebsiella oxytoca NOT DETECTED NOT DETECTED   Klebsiella pneumoniae NOT DETECTED NOT DETECTED   Proteus species NOT DETECTED NOT DETECTED   Serratia marcescens NOT DETECTED NOT DETECTED   Haemophilus influenzae NOT DETECTED NOT DETECTED   Neisseria meningitidis NOT DETECTED NOT DETECTED   Pseudomonas aeruginosa NOT DETECTED NOT DETECTED   Candida albicans NOT DETECTED NOT DETECTED   Candida glabrata NOT DETECTED NOT DETECTED   Candida krusei NOT DETECTED NOT DETECTED   Candida parapsilosis NOT DETECTED NOT DETECTED   Candida tropicalis NOT DETECTED NOT DETECTED    Name of physician (or Provider) Contacted: Sarajane Jews 1/2 coag neg staph likely contaminant, currently on levaquin for COPD exacerbation, no clinical signs of systemic infection Changes to prescribed antibiotics required: No changes recommended at this time  Carnella Guadalajara 07/19/2017  3:00 PM

## 2017-07-19 NOTE — Care Management Note (Signed)
Case Management Note  Patient Details  Name: [name] MRN: [id number] Date of Birth: 1936-01-31  Subjective/Objective:    COPD              Action/Plan: Patient lives at home with his spouse; PCP: Tammi Sou, MD; has private insurance with Medicare / BCBS with prescription drug coverage; pharmacy of choice is [facility]; DME - patient has home oxygen, walker, cane at home; patient could benefit from a Disease Management Program for COPD; patient requested [place]; Butch Penny with [facility] called for arrangements; Attending MD at discharge please enter the face to face for [facility] service.   Expected Discharge Date:    possibly 07/19/2017              Expected Discharge Plan:  [place]  In-House Referral:   [facility]  Discharge planning Services  CM Consult  Choice offered to:  Patient, Adult Children  HH Arranged:  RN, Disease Management, PT [place] Agency:  [facility]  Status of Service:  In process, will continue to follow  Sherrilyn Rist [phone] 07/19/2017, 1:57 PM

## 2017-07-19 NOTE — Discharge Summary (Signed)
Physician Discharge Summary  [name] RFF:[id number] DOB: 1936-03-12 DOA: 07/18/2017  PCP: Tammi Sou, MD  Admit date: 07/18/2017 Discharge date: 07/19/2017  Recommendations for Outpatient Follow-up:  1. Resolution of COPD exacerbation.  Ongoing treatment for chronic diastolic heart failure.   Follow-up Information    [facility].   Why:  They will do your home health care at your home Contact information: [address] [place] [address] [phone]        Tammi Sou, MD. Go on 07/31/2017.   Specialty:  Family Medicine Why:  @3 :30pm Contact information: [address] [place] [address] [phone]            Discharge Diagnoses:  1. COPD exacerbation 2. Acute on chronic hypoxic respiratory failure 3. Acute on chronic diastolic congestive heart failure 4. Elevated troponin 5. Chronic kidney disease stage III 6. Atrial fibrillation 7. Obesity unspecified 8. Thoracic aortic atherosclerosis  Discharge Condition: improved Disposition: HH RN, PT, disease management  Diet recommendation: heart healthy  Filed Weights   07/18/17 0940 07/18/17 1801 07/19/17 0554  Weight: 87.1 kg (192 lb) 84.7 kg (186 lb 11.2 oz) 83.6 kg (184 lb 3.2 oz)    History of present illness:  81 year old man PMH COPD, chronic hypoxic respiratory failure presented with worsening shortness of breath, seen by PCP and sent to the emergency department.  Admitted for COPD exacerbation, consideration was given to pneumonia; acute on chronic diastolic congestive heart failure  Hospital Course:  Treated for COPD exacerbation with a rapid clinical improvement.  Also adequately diuresed improving overall clinical condition.  Ambulating in hall without difficulty.  Appears to be near baseline.  Hospitalization was uncomplicated.  Individual issues as below  COPD exacerbation.  Repeat chest x-ray no evidence of pneumonia.  Edema  pattern improving. -Improving.  Transition to oral steroids.  Continue bronchodilators on discharge.  Dry cough only.  No indication for antibiotics.  Acute on chronic hypoxic respiratory failure (2L) secondary COPD, recently started on daily prednisone -Appears stable.  Acute on chronic diastolic congestive heart failure -Appears euvolemic  Elevated troponin -Trivial elevation.  No further evaluation is suggested.  CKD stage III -Appears stable overall.  Atrial fibrillation, rate controlled -continue warfarin  Obesity unspecified  Thoracic aortic atherosclerosis -Continue statin  Today's assessment: S: Feels fine.  Breathing fine.  Wants to go home. O: Vitals: Afebrile, 97.8, 20, 117, 120/49, 91% on 2 L   Constitutional.  Appears calm, comfortable.  Respiratory.  Clear to auscultation bilaterally.  Fair air movement.  No frank wheezes, rales or rhonchi.  Mild increased respiratory effort.  Cardiovascular.  Tachycardic, irregular.  No murmur, rub or gallop.  No lower extremity edema.  Musculoskeletal.  Observed ambulating in the hallway with physical therapy.  I have personally reviewed the following  CBC unremarkable, lactic acid was within normal  Influenza PCR was negative  Blood cultures 1/2 coag negative staph consistent with contaminant.  Troponins flat.  Sodium today within normal limits.  Creatinine 1.55.  LFTs unremarkable.  INR 2.25, serum digoxin less than 0.2  Chest x-ray independently  EKG atrial fibrillation, nonspecific ST changes  Discharge Instructions  Discharge Instructions    Diet - low sodium heart healthy    Complete by:  As directed    Discharge instructions    Complete by:  As directed    Call your physician or seek immediate medical attention for shortness of breath, pain, swelling or worsening of  condition.   Increase activity slowly    Complete by:  As directed      Allergies as of 07/19/2017   No Known Allergies       Medication List    TAKE these medications   albuterol 108 (90 Base) MCG/ACT inhaler Commonly known as:  PROVENTIL HFA;VENTOLIN HFA Inhale 1 puff into the lungs every 6 (six) hours as needed for wheezing or shortness of breath (using 5-6 times every 24 hrs).   budesonide 0.5 MG/2ML nebulizer solution Commonly known as:  PULMICORT USE ONE VIAL IN NEBULIZER TWICE DAILY   digoxin 0.125 MG tablet Commonly known as:  LANOXIN Take 1 tablet (0.125 mg total) by mouth daily.   diltiazem 240 MG 24 hr capsule Commonly known as:  CARDIZEM CD Take 1 capsule (240 mg total) by mouth daily.   furosemide 40 MG tablet Commonly known as:  LASIX Take one (1) tablet by mouth alternating with half (1/2) tablet by mouth daily.   LORazepam 0.5 MG tablet Commonly known as:  ATIVAN Take 1 tablet (0.5 mg total) by mouth 2 (two) times daily as needed for anxiety.   olopatadine 0.1 % ophthalmic solution Commonly known as:  PATANOL Place 1 drop into both eyes 2 (two) times daily.   pravastatin 40 MG tablet Commonly known as:  PRAVACHOL Take 1 tablet (40 mg total) by mouth every evening.   predniSONE 10 MG tablet Commonly known as:  DELTASONE Take total 30 mg daily starting 11/1, then 11/2 and 11/3 then resume 15 mg daily. What changed:  medication strength  how much to take  how to take this  when to take this  additional instructions   Tiotropium Bromide-Olodaterol 2.5-2.5 MCG/ACT Aers Commonly known as:  STIOLTO RESPIMAT Inhale 2 puffs into the lungs daily.   warfarin 5 MG tablet Commonly known as:  COUMADIN Take 1 to 1.5 tablets by mouth daily as directed by coumadin clinic What changed:  how much to take  how to take this  when to take this  additional instructions      No Known Allergies  The results of significant diagnostics from this hospitalization (including imaging, microbiology, ancillary and laboratory) are listed below for reference.    Significant Diagnostic  Studies: X-ray Chest Pa And Lateral  Result Date: 07/19/2017 CLINICAL DATA:  Dyspnea.  COPD EXAM: CHEST  2 VIEW COMPARISON:  07/18/2017 FINDINGS: Mitral valve replacement. Heart size upper normal. Atherosclerotic disease aortic arch. Improvement in vascular congestion compatible with improving heart failure. Negative for edema or effusion. Negative for pneumonia. IMPRESSION: COPD. Improving vascular congestion compatible with mild heart failure. Electronically Signed   By: Franchot Gallo M.D.   On: 07/19/2017 09:11   Dg Chest 2 View  Result Date: 07/18/2017 CLINICAL DATA:  Increase shortness of breath last night. Chronic shortness of breath on home oxygen. History of COPD, atrial fibrillation, prostate malignancy, valvular heart disease, former smoker. EXAM: CHEST  2 VIEW COMPARISON:  Chest x-ray of January 03, 2017 FINDINGS: The lungs are well-expanded. There is confluent alveolar opacity in the right infrahilar region more conspicuous than in the past. Overall the interstitial markings are more conspicuous in the pulmonary vascularity more engorged. There is no significant pleural effusion. The cardiac silhouette is top-normal in size. There is calcification in the wall of the aortic arch. A prosthetic mitral valve ring is visible. IMPRESSION: CHF superimposed upon COPD. There may be atelectasis or early pneumonia developing in the right lower lobe. Followup PA and lateral  chest X-ray is recommended in 3-4 weeks following trial of antibiotic therapy to ensure resolution and exclude underlying malignancy. Thoracic aortic atherosclerosis. Electronically Signed   By: David  Martinique M.D.   On: 07/18/2017 10:01    Microbiology: Recent Results (from the past 240 hour(s))  Blood culture (routine x 2)     Status: None (Preliminary result)   Collection Time: 07/18/17 11:00 AM  Result Value Ref Range Status   Specimen Description   Final    BLOOD LEFT ANTECUBITAL Performed at [facility], [place]  [address], [place], [place] [address]    Special Requests   Final    BOTTLES DRAWN AEROBIC AND ANAEROBIC Blood Culture adequate volume   Culture  Setup Time   Final    GRAM POSITIVE COCCI IN CLUSTERS IN BOTH AEROBIC AND ANAEROBIC BOTTLES Organism ID to follow CRITICAL RESULT CALLED TO, READ BACK BY AND VERIFIED WITH: J. Oriet Pharm.D. 14:50 07/19/17 ([email or web address])    Culture GRAM POSITIVE COCCI  Final   Report Status PENDING  Incomplete  Blood Culture ID Panel (Reflexed)     Status: Abnormal   Collection Time: 07/18/17 11:00 AM  Result Value Ref Range Status   Enterococcus species NOT DETECTED NOT DETECTED Final   Listeria monocytogenes NOT DETECTED NOT DETECTED Final   Staphylococcus species DETECTED (A) NOT DETECTED Final    Comment: Methicillin (oxacillin) susceptible coagulase negative staphylococcus. Possible blood culture contaminant (unless isolated from more than one blood culture draw or clinical case suggests pathogenicity). No antibiotic treatment is indicated for blood  culture contaminants. CRITICAL RESULT CALLED TO, READ BACK BY AND VERIFIED WITH: J. Oriet Pharm.D. 14:50 07/19/17 ([email or web address])    Staphylococcus aureus NOT DETECTED NOT DETECTED Final   Methicillin resistance NOT DETECTED NOT DETECTED Final   Streptococcus species NOT DETECTED NOT DETECTED Final   Streptococcus agalactiae NOT DETECTED NOT DETECTED Final   Streptococcus pneumoniae NOT DETECTED NOT DETECTED Final   Streptococcus pyogenes NOT DETECTED NOT DETECTED Final   Acinetobacter baumannii NOT DETECTED NOT DETECTED Final   Enterobacteriaceae species NOT DETECTED NOT DETECTED Final   Enterobacter cloacae complex NOT DETECTED NOT DETECTED Final   Escherichia coli NOT DETECTED NOT DETECTED Final   Klebsiella oxytoca NOT DETECTED NOT DETECTED Final   Klebsiella pneumoniae NOT DETECTED NOT DETECTED Final   Proteus species NOT DETECTED NOT DETECTED Final   Serratia marcescens NOT DETECTED NOT DETECTED Final   Haemophilus  influenzae NOT DETECTED NOT DETECTED Final   Neisseria meningitidis NOT DETECTED NOT DETECTED Final   Pseudomonas aeruginosa NOT DETECTED NOT DETECTED Final   Candida albicans NOT DETECTED NOT DETECTED Final   Candida glabrata NOT DETECTED NOT DETECTED Final   Candida krusei NOT DETECTED NOT DETECTED Final   Candida parapsilosis NOT DETECTED NOT DETECTED Final   Candida tropicalis NOT DETECTED NOT DETECTED Final    Comment: Performed at [facility], [place] [address], [place], [place] [address]  Blood culture (routine x 2)     Status: None (Preliminary result)   Collection Time: 07/18/17 11:15 AM  Result Value Ref Range Status   Specimen Description BLOOD RIGHT HAND  Final   Special Requests   Final    BOTTLES DRAWN AEROBIC AND ANAEROBIC Blood Culture adequate volume   Culture   Final    NO GROWTH 1 DAY Performed at [facility], [place] [address] [place], [place] [address]    Report Status PENDING  Incomplete  Respiratory Panel by PCR  Status: None   Collection Time: 07/18/17  6:19 PM  Result Value Ref Range Status   Adenovirus NOT DETECTED NOT DETECTED Final   Coronavirus 229E NOT DETECTED NOT DETECTED Final   Coronavirus HKU1 NOT DETECTED NOT DETECTED Final   Coronavirus NL63 NOT DETECTED NOT DETECTED Final   Coronavirus OC43 NOT DETECTED NOT DETECTED Final   Metapneumovirus NOT DETECTED NOT DETECTED Final   Rhinovirus / Enterovirus NOT DETECTED NOT DETECTED Final   Influenza A NOT DETECTED NOT DETECTED Final   Influenza B NOT DETECTED NOT DETECTED Final   Parainfluenza Virus 1 NOT DETECTED NOT DETECTED Final   Parainfluenza Virus 2 NOT DETECTED NOT DETECTED Final   Parainfluenza Virus 3 NOT DETECTED NOT DETECTED Final   Parainfluenza Virus 4 NOT DETECTED NOT DETECTED Final   Respiratory Syncytial Virus NOT DETECTED NOT DETECTED Final   Bordetella pertussis NOT DETECTED NOT DETECTED Final   Chlamydophila pneumoniae NOT DETECTED NOT DETECTED Final   Mycoplasma  pneumoniae NOT DETECTED NOT DETECTED Final     Labs: Basic Metabolic Panel:  Recent Labs Lab 07/18/17 1007 07/18/17 1014 07/19/17 0745  NA 135 137 135  K 3.9 3.9 4.1  CL 102 99* 101  CO2 22  --  22  GLUCOSE 104* 105* 167*  BUN 12 14 18   CREATININE 1.35* 1.20 1.45*  CALCIUM 9.0  --  9.6   Liver Function Tests:  Recent Labs Lab 07/18/17 1007 07/19/17 0745  AST 18 18  ALT 16* 17  ALKPHOS 60 56  BILITOT 1.2 1.2  PROT 6.6 6.8  ALBUMIN 3.5 3.4*   CBC:  Recent Labs Lab 07/18/17 1007 07/18/17 1014 07/19/17 0745  WBC 9.1  --  11.2*  NEUTROABS 5.8  --   --   HGB 13.8 15.0 14.0  HCT 42.4 44.0 41.4  MCV 87.8  --  85.4  PLT 181  --  173   Cardiac Enzymes:  Recent Labs Lab 07/18/17 1007 07/18/17 1407 07/18/17 1956 07/18/17 2359 07/19/17 0745  TROPONINI 0.05* 0.04* 0.03* 0.04* 0.04*     Recent Labs  07/18/17 1007  BNP 65.3     Active Problems:   Essential hypertension, benign   MITRAL VALVE REPLACEMENT, HX OF   Long term current use of anticoagulant   Atrial fibrillation (HCC)   Chronic hypoxemic respiratory failure (HCC)   COPD exacerbation (HCC)   Acute on chronic congestive heart failure (HCC)   COPD with acute exacerbation (HCC)   Time coordinating discharge: 35 minutes  Signed:  Murray Hodgkins, MD [facility] 07/19/2017, 4:56 PM

## 2017-07-19 NOTE — Progress Notes (Signed)
Initial Nutrition Assessment  DOCUMENTATION CODES:   Obesity unspecified  INTERVENTION:   - Provide pt with snacks BID  NUTRITION DIAGNOSIS:   Increased nutrient needs related to chronic illness (COPD) as evidenced by estimated needs.  GOAL:   Patient will meet greater than or equal to 90% of their needs  MONITOR:   PO intake, Supplement acceptance, Labs, Weight trends  REASON FOR ASSESSMENT:   Consult Assessment of nutrition requirement/status  ASSESSMENT:   Pt is a 81 year old male presenting with SOB and COPD exacerbation. PMH of chronic hypoxic RF on home O2, COPD, Afib s/p maze, HTN, HLD, CKD st II, CAD, and tobacco abuse.  Pt reports typically eating sausage, eggs, and a biscuit with gravy for breakfast. A peanut butter and mustard sandwich with crackers for lunch. For dinner, casseroles, country steak, or chicken with vegetables. Pt does not drink nutritional supplements at home and was not interested in trying them here. Will order extra snacks for pt.   He reports that his appetite has been fine and that his SOB has not affected his eating abilities. He ate most of his dinner last night. Pt ate 100% of breakfast this morning.  Pt says that his weight has steadily declined in the past two years and he feels it's related to aging. Per chart weights, his UBW since March 2018 is 190 lbs. He reports being 220 lbs in 2016 and now 184 lbs.   Discussed and provided pt with Nutrition Care Manual handout "COPD Nutrition Therapy".  Medications- Lasix, Coumadin  Labs- Glucose 167 (H), Cr 1.45 (H), GFR 44 (L)  NUTRITION - FOCUSED PHYSICAL EXAM:    Most Recent Value  Orbital Region  No depletion  Upper Arm Region  Mild depletion  Thoracic and Lumbar Region  No depletion  Buccal Region  No depletion  Temple Region  No depletion  Clavicle Bone Region  No depletion  Clavicle and Acromion Bone Region  No depletion  Scapular Bone Region  No depletion  Dorsal Hand  No  depletion  Patellar Region  No depletion  Anterior Thigh Region  Mild depletion  Posterior Calf Region  No depletion  Edema (RD Assessment)  None  Hair  Reviewed  Eyes  Reviewed  Mouth  Reviewed  Skin  Reviewed  Nails  Reviewed       Diet Order:  Diet renal with fluid restriction Fluid restriction: 1200 mL Fluid; Room service appropriate? Yes; Fluid consistency: Thin  EDUCATION NEEDS:   No education needs have been identified at this time  Skin:  Skin Assessment: Reviewed RN Assessment  Last BM:  07/18/17  Height:   Ht Readings from Last 1 Encounters:  07/18/17 5' 5.5" (1.664 m)    Weight:   Wt Readings from Last 1 Encounters:  07/19/17 184 lb 3.2 oz (83.6 kg)    Ideal Body Weight:  63.2 kg  BMI:  Body mass index is 30.19 kg/m.  Estimated Nutritional Needs:   Kcal:  2100-2300 kcals  Protein:  105-115 grams protein  Fluid:  1200 mL (fl restric)    Corena Herter [facility] Dietetic Intern Pager: [phone] 07/19/2017 1:09 PM

## 2017-07-20 ENCOUNTER — Telehealth: Payer: Self-pay

## 2017-07-20 LAB — ECHOCARDIOGRAM COMPLETE
Height: 65.5 in
WEIGHTICAEL: 2947.2 [oz_av]

## 2017-07-20 NOTE — Telephone Encounter (Signed)
Attempted to reach patient to complete TCM and confirm hospital f/u appt, phone busy x 3 attempts.

## 2017-07-21 ENCOUNTER — Telehealth: Payer: Self-pay | Admitting: *Deleted

## 2017-07-21 DIAGNOSIS — E1122 Type 2 diabetes mellitus with diabetic chronic kidney disease: Secondary | ICD-10-CM | POA: Diagnosis not present

## 2017-07-21 DIAGNOSIS — Z87891 Personal history of nicotine dependence: Secondary | ICD-10-CM | POA: Diagnosis not present

## 2017-07-21 DIAGNOSIS — I272 Pulmonary hypertension, unspecified: Secondary | ICD-10-CM | POA: Diagnosis not present

## 2017-07-21 DIAGNOSIS — Z8546 Personal history of malignant neoplasm of prostate: Secondary | ICD-10-CM | POA: Diagnosis not present

## 2017-07-21 DIAGNOSIS — Z7901 Long term (current) use of anticoagulants: Secondary | ICD-10-CM | POA: Diagnosis not present

## 2017-07-21 DIAGNOSIS — Z9981 Dependence on supplemental oxygen: Secondary | ICD-10-CM | POA: Diagnosis not present

## 2017-07-21 DIAGNOSIS — Z952 Presence of prosthetic heart valve: Secondary | ICD-10-CM | POA: Diagnosis not present

## 2017-07-21 DIAGNOSIS — Z8673 Personal history of transient ischemic attack (TIA), and cerebral infarction without residual deficits: Secondary | ICD-10-CM | POA: Diagnosis not present

## 2017-07-21 DIAGNOSIS — I700 Atherosclerosis of aorta: Secondary | ICD-10-CM | POA: Diagnosis not present

## 2017-07-21 DIAGNOSIS — N183 Chronic kidney disease, stage 3 (moderate): Secondary | ICD-10-CM | POA: Diagnosis not present

## 2017-07-21 DIAGNOSIS — Z9079 Acquired absence of other genital organ(s): Secondary | ICD-10-CM | POA: Diagnosis not present

## 2017-07-21 DIAGNOSIS — I130 Hypertensive heart and chronic kidney disease with heart failure and stage 1 through stage 4 chronic kidney disease, or unspecified chronic kidney disease: Secondary | ICD-10-CM | POA: Diagnosis not present

## 2017-07-21 DIAGNOSIS — I480 Paroxysmal atrial fibrillation: Secondary | ICD-10-CM | POA: Diagnosis not present

## 2017-07-21 DIAGNOSIS — K5190 Ulcerative colitis, unspecified, without complications: Secondary | ICD-10-CM | POA: Diagnosis not present

## 2017-07-21 DIAGNOSIS — J9621 Acute and chronic respiratory failure with hypoxia: Secondary | ICD-10-CM | POA: Diagnosis not present

## 2017-07-21 DIAGNOSIS — J441 Chronic obstructive pulmonary disease with (acute) exacerbation: Secondary | ICD-10-CM | POA: Diagnosis not present

## 2017-07-21 DIAGNOSIS — I5033 Acute on chronic diastolic (congestive) heart failure: Secondary | ICD-10-CM | POA: Diagnosis not present

## 2017-07-21 DIAGNOSIS — I2510 Atherosclerotic heart disease of native coronary artery without angina pectoris: Secondary | ICD-10-CM | POA: Diagnosis not present

## 2017-07-21 DIAGNOSIS — E669 Obesity, unspecified: Secondary | ICD-10-CM | POA: Diagnosis not present

## 2017-07-21 LAB — CULTURE, BLOOD (ROUTINE X 2): Special Requests: ADEQUATE

## 2017-07-21 NOTE — Telephone Encounter (Signed)
Unable to reach patient at time of TCM Call.

## 2017-07-21 NOTE — Telephone Encounter (Signed)
Angie nurse with [facility] LMOM on 07/21/17 at 9:53am to let Dr. Anitra Lauth know that the hospital has ordered [facility] for pt for COPD. She stated that she will be going out to see pt today, then 2 days next week then 1 day every other week. No call back needed.

## 2017-07-21 NOTE — Telephone Encounter (Signed)
Noted  

## 2017-07-23 ENCOUNTER — Encounter: Payer: Self-pay | Admitting: Family Medicine

## 2017-07-23 LAB — CULTURE, BLOOD (ROUTINE X 2)
Culture: NO GROWTH
SPECIAL REQUESTS: ADEQUATE

## 2017-07-24 ENCOUNTER — Other Ambulatory Visit: Payer: Self-pay

## 2017-07-24 NOTE — Patient Outreach (Addendum)
[facility]) Care Management  07/24/2017  HAGOP MCCOLLAM [birth year] [id number]     EMMI-COPD RED ON EMMI ALERT Day # 1 Date: 07/21/17 Red Alert Reason: "Feeling worse overall? Yes  Mucus change color? Yes" Day # 3 Date: 07/22/17 Red Alert Reason: " # of times rescue inhaler used in past 24 hrs? 7"   Outreach attempt # 1 to patient.No answer. RN CM left HIPAA compliant voicemail message along with contact info.       Plan: RN CM will make outreach attempt to patient within one business day if no return call from patient.   Enzo Montgomery, RN,BSN,CCM [place] Management Telephonic Care Management Coordinator Direct Phone: [phone] Toll Free: [phone] Fax: [phone]

## 2017-07-25 ENCOUNTER — Telehealth: Payer: Self-pay | Admitting: Cardiology

## 2017-07-25 ENCOUNTER — Other Ambulatory Visit: Payer: Self-pay

## 2017-07-25 DIAGNOSIS — I2510 Atherosclerotic heart disease of native coronary artery without angina pectoris: Secondary | ICD-10-CM | POA: Diagnosis not present

## 2017-07-25 DIAGNOSIS — J9621 Acute and chronic respiratory failure with hypoxia: Secondary | ICD-10-CM | POA: Diagnosis not present

## 2017-07-25 DIAGNOSIS — I5033 Acute on chronic diastolic (congestive) heart failure: Secondary | ICD-10-CM | POA: Diagnosis not present

## 2017-07-25 DIAGNOSIS — J441 Chronic obstructive pulmonary disease with (acute) exacerbation: Secondary | ICD-10-CM | POA: Diagnosis not present

## 2017-07-25 DIAGNOSIS — E1122 Type 2 diabetes mellitus with diabetic chronic kidney disease: Secondary | ICD-10-CM | POA: Diagnosis not present

## 2017-07-25 DIAGNOSIS — I130 Hypertensive heart and chronic kidney disease with heart failure and stage 1 through stage 4 chronic kidney disease, or unspecified chronic kidney disease: Secondary | ICD-10-CM | POA: Diagnosis not present

## 2017-07-25 NOTE — Telephone Encounter (Signed)
New message     [facility] calling to find out if it is ok or needed to draw PTINR today when nurse arrives at patient home.  Please call [place]

## 2017-07-25 NOTE — Telephone Encounter (Signed)
LMOM to check INR at next visit

## 2017-07-25 NOTE — Patient Outreach (Signed)
[facility]) Care Management  07/25/2017  [name] 1936/04/11 [id number]   EMMI-COPD RED ON EMMI ALERT Day # 1 Date: 07/21/17 Red Alert Reason: "Feeling worse overall? Yes  Mucus change color? Yes" Day # 3 Date: 07/22/17 Red Alert Reason: " # of times rescue inhaler used in past 24 hrs? 7"     Outreach attempt #2 to patient. No answer at present.     Plan: RN CM will send unsuccessful outreach letter to patient and close case if no response within 10 business days.    Enzo Montgomery, RN,BSN,CCM [place] Management Telephonic Care Management Coordinator Direct Phone: [phone] Toll Free: [phone] Fax: [phone]

## 2017-07-26 ENCOUNTER — Telehealth: Payer: Self-pay | Admitting: *Deleted

## 2017-07-26 DIAGNOSIS — J441 Chronic obstructive pulmonary disease with (acute) exacerbation: Secondary | ICD-10-CM | POA: Diagnosis not present

## 2017-07-26 DIAGNOSIS — E1122 Type 2 diabetes mellitus with diabetic chronic kidney disease: Secondary | ICD-10-CM | POA: Diagnosis not present

## 2017-07-26 DIAGNOSIS — I5033 Acute on chronic diastolic (congestive) heart failure: Secondary | ICD-10-CM | POA: Diagnosis not present

## 2017-07-26 DIAGNOSIS — J9621 Acute and chronic respiratory failure with hypoxia: Secondary | ICD-10-CM | POA: Diagnosis not present

## 2017-07-26 DIAGNOSIS — I130 Hypertensive heart and chronic kidney disease with heart failure and stage 1 through stage 4 chronic kidney disease, or unspecified chronic kidney disease: Secondary | ICD-10-CM | POA: Diagnosis not present

## 2017-07-26 DIAGNOSIS — I2510 Atherosclerotic heart disease of native coronary artery without angina pectoris: Secondary | ICD-10-CM | POA: Diagnosis not present

## 2017-07-26 NOTE — Telephone Encounter (Signed)
Stacey PT with [facility] called to let Dr. Anitra Lauth know that pt is doing well and feels like he does not need PT. She stated that pt appeared to be doing well so she is going to d/c him.

## 2017-07-26 NOTE — Telephone Encounter (Signed)
Noted  

## 2017-07-27 DIAGNOSIS — I5033 Acute on chronic diastolic (congestive) heart failure: Secondary | ICD-10-CM | POA: Diagnosis not present

## 2017-07-27 DIAGNOSIS — I2510 Atherosclerotic heart disease of native coronary artery without angina pectoris: Secondary | ICD-10-CM | POA: Diagnosis not present

## 2017-07-27 DIAGNOSIS — J441 Chronic obstructive pulmonary disease with (acute) exacerbation: Secondary | ICD-10-CM | POA: Diagnosis not present

## 2017-07-27 DIAGNOSIS — J9621 Acute and chronic respiratory failure with hypoxia: Secondary | ICD-10-CM | POA: Diagnosis not present

## 2017-07-27 DIAGNOSIS — I130 Hypertensive heart and chronic kidney disease with heart failure and stage 1 through stage 4 chronic kidney disease, or unspecified chronic kidney disease: Secondary | ICD-10-CM | POA: Diagnosis not present

## 2017-07-27 DIAGNOSIS — E1122 Type 2 diabetes mellitus with diabetic chronic kidney disease: Secondary | ICD-10-CM | POA: Diagnosis not present

## 2017-07-27 DIAGNOSIS — I480 Paroxysmal atrial fibrillation: Secondary | ICD-10-CM | POA: Diagnosis not present

## 2017-07-28 ENCOUNTER — Telehealth: Payer: Self-pay | Admitting: *Deleted

## 2017-07-28 LAB — PROTIME-INR: INR: 3.1 — AB (ref <=1.1)

## 2017-07-28 NOTE — Telephone Encounter (Signed)
[name] with [facility] called with pts PT/INR results. PT: 30.2 INR: 3.1 Pt is taking 5mg  coumadin daily. She has requested and order for recheck and changes be faxed to them. Fax: [phone] The fax from [facility] with the results has been put on Dr. Idelle Leech desk for review.

## 2017-07-28 NOTE — Telephone Encounter (Signed)
Pls forward this to cardiology coumadin clinic---they manage his coumadin and PT/INR follow ups.-thx

## 2017-07-28 NOTE — Telephone Encounter (Signed)
To coumadin clinic [facility]

## 2017-07-31 ENCOUNTER — Ambulatory Visit (INDEPENDENT_AMBULATORY_CARE_PROVIDER_SITE_OTHER): Payer: Medicare Other | Admitting: Pharmacist Clinician (PhC)/ Clinical Pharmacy Specialist

## 2017-07-31 ENCOUNTER — Ambulatory Visit: Payer: Medicare Other | Admitting: Family Medicine

## 2017-07-31 DIAGNOSIS — I5033 Acute on chronic diastolic (congestive) heart failure: Secondary | ICD-10-CM | POA: Diagnosis not present

## 2017-07-31 DIAGNOSIS — I2510 Atherosclerotic heart disease of native coronary artery without angina pectoris: Secondary | ICD-10-CM | POA: Diagnosis not present

## 2017-07-31 DIAGNOSIS — Z5181 Encounter for therapeutic drug level monitoring: Secondary | ICD-10-CM

## 2017-07-31 DIAGNOSIS — I480 Paroxysmal atrial fibrillation: Secondary | ICD-10-CM | POA: Diagnosis not present

## 2017-07-31 DIAGNOSIS — J9621 Acute and chronic respiratory failure with hypoxia: Secondary | ICD-10-CM | POA: Diagnosis not present

## 2017-07-31 DIAGNOSIS — E1122 Type 2 diabetes mellitus with diabetic chronic kidney disease: Secondary | ICD-10-CM | POA: Diagnosis not present

## 2017-07-31 DIAGNOSIS — I130 Hypertensive heart and chronic kidney disease with heart failure and stage 1 through stage 4 chronic kidney disease, or unspecified chronic kidney disease: Secondary | ICD-10-CM | POA: Diagnosis not present

## 2017-07-31 DIAGNOSIS — J441 Chronic obstructive pulmonary disease with (acute) exacerbation: Secondary | ICD-10-CM | POA: Diagnosis not present

## 2017-07-31 NOTE — Progress Notes (Deleted)
07/31/2017  CC: No chief complaint on file.   Patient is a 81 y.o. Caucasian male who presents for  hospital follow up, specifically [facility] face-to-face visit. Dates hospitalized: 10/30-10/31, 2018. Days since d/c from hospital: 12 Patient was discharged from hospital to home. Reason for admission to hospital: COPD exacerbation/acute on chronic CHF (pt with chronic hypoxemic resp failure). Date of interactive (phone) contact with patient and/or caregiver: RN Sabas Sous attempted x 3 on 11/1 and again on 11/2, 2018.  I have reviewed patient's discharge summary plus pertinent specific notes, labs, and imaging from the hospitalization.    {current status of patient, symptoms, etc}  Medication reconciliation was done today and patient {is not} {is} taking meds as recommended by discharging hospitalist/specialist.    PMH:  Past Medical History:  Diagnosis Date  . Adrenal adenoma   . Atrial fibrillation (HCC)    Rate control + Coumadin  . Atrial flutter ([place])   . CAD (coronary artery disease)    Lexiscan Myoview (01/2014): Lateral soft tissue attenuation, no ischemia, not gated, low risk  . CAP (community acquired pneumonia)    Hospitalized 10/2014  . Chronic hypoxemic respiratory failure (HCC)    from COPD  . Chronic renal insufficiency, stage III (moderate) (HCC)    CrCl 50s  . COPD (chronic obstructive pulmonary disease) (HCC)    oxygen-dependent (2.5 L 24/7).  Improved on starting chronic prednisone 06/2017.  . Diabetes mellitus without complication ([place]) 09/81/1914   A1c 6.5 % 05/2016  . Diverticulosis   . Dyslipidemia   . Dyspnea    Multifactorial: deconditioning, COPD, obesity hypoventilation syndrome.  . Embolic stroke ([place])    d/t endocarditis 2008  . Hemorrhoids   . History of mitral valve replacement    needs SBE prophylaxis  . History of prostate cancer remote past   f/u by Dr. Claris Che; no recurrence as of f/u 10/2015.  PSA rising from 2013  to 06/2016: as of 02/2017 plan is bone scan (CT if PSA gets above 5).  Marland Kitchen HTN (hypertension)   . Mediastinal lymphadenopathy   . Pulmonary hypertension ([place]) 01/09/2015   secondary  . Right thyroid nodule   . Thrombocytopenia ([place])   . TIA (transient ischemic attack) 03/25/2011   ? of due to transient diplopia  . Ulcerative colitis    left-sided/segmental, associated with diverticulosis.  Sulfasalazine per GI (Dr. Carlean Purl).  . Warthin's tumor     PSH:  Past Surgical History:  Procedure Laterality Date  . carotid duplex dopplers  04/2014   No signif plaques; repeat 2 yrs per cardiology  . CATARACT EXTRACTION, BILATERAL    . COLONOSCOPY W/ BIOPSIES  12/19/2007   left-sided colitis, diverticulosis, hemorrhoids  . FLEXIBLE SIGMOIDOSCOPY  01/11/2008; 03/19/2010   2009 and 2011:segmental left colitis, diverticulosis, hemorrhoids  . INGUINAL HERNIA REPAIR     right  . MITRAL VALVE REPAIR  09/22/04   and modified Cox-Maze IV   . MITRAL VALVE REPAIR  09/14/05   redo MV repair d/t endocardiits/embolic events  . PROSTATECTOMY  06/2003   Radical  . SALIVARY GLAND SURGERY     left resection  . TRANSTHORACIC ECHOCARDIOGRAM  11/2013; 07/2017   2015: Mod LVH, EF 55-60%, no WM abnormalities, +LA dilation, +severe RV dilation and impaired systolic fxn, +increased pulm art pressures.  Valves ok.  2018- EF 60-65%, wall motion nl, s/p MV repair, mild RV syst dysf.    MEDS:  Outpatient Medications Prior to Visit  Medication Sig Dispense Refill  .  albuterol (PROVENTIL HFA;VENTOLIN HFA) 108 (90 Base) MCG/ACT inhaler Inhale 1 puff into the lungs every 6 (six) hours as needed for wheezing or shortness of breath (using 5-6 times every 24 hrs). 3 Inhaler 2  . budesonide (PULMICORT) 0.5 MG/2ML nebulizer solution USE ONE VIAL IN NEBULIZER TWICE DAILY 120 mL 5  . digoxin (LANOXIN) 0.125 MG tablet Take 1 tablet (0.125 mg total) by mouth daily. 90 tablet 3  . diltiazem (CARDIZEM CD) 240 MG 24 hr capsule Take 1 capsule  (240 mg total) by mouth daily. 90 capsule 3  . furosemide (LASIX) 40 MG tablet Take one (1) tablet by mouth alternating with half (1/2) tablet by mouth daily. 90 tablet 3  . LORazepam (ATIVAN) 0.5 MG tablet Take 1 tablet (0.5 mg total) by mouth 2 (two) times daily as needed for anxiety. 30 tablet 5  . olopatadine (PATANOL) 0.1 % ophthalmic solution Place 1 drop into both eyes 2 (two) times daily. 5 mL 12  . pravastatin (PRAVACHOL) 40 MG tablet Take 1 tablet (40 mg total) by mouth every evening. 90 tablet 2  . predniSONE (DELTASONE) 10 MG tablet Take total 30 mg daily starting 11/1, then 11/2 and 11/3 then resume 15 mg daily. 60 tablet 0  . Tiotropium Bromide-Olodaterol (STIOLTO RESPIMAT) 2.5-2.5 MCG/ACT AERS Inhale 2 puffs into the lungs daily. 3 Inhaler 3  . warfarin (COUMADIN) 5 MG tablet Take 1 to 1.5 tablets by mouth daily as directed by coumadin clinic (Patient taking differently: Take 5 mg by mouth daily at 6 PM. ) 105 tablet 1   No facility-administered medications prior to visit.     Pertinent labs/imaging ***  ASSESSMENT/PLAN:  ***  {Medical decision making of moderate complexity was utilized today} 99495  {Medical decision making of high complexity was utilized today} 99496  FOLLOW UP:  ***

## 2017-08-03 ENCOUNTER — Ambulatory Visit: Payer: Medicare Other | Admitting: Family Medicine

## 2017-08-04 ENCOUNTER — Telehealth: Payer: Self-pay | Admitting: Cardiology

## 2017-08-04 ENCOUNTER — Encounter: Payer: Self-pay | Admitting: Family Medicine

## 2017-08-04 ENCOUNTER — Ambulatory Visit (INDEPENDENT_AMBULATORY_CARE_PROVIDER_SITE_OTHER): Payer: Medicare Other | Admitting: Cardiology

## 2017-08-04 ENCOUNTER — Other Ambulatory Visit: Payer: Self-pay

## 2017-08-04 ENCOUNTER — Ambulatory Visit (INDEPENDENT_AMBULATORY_CARE_PROVIDER_SITE_OTHER): Payer: Medicare Other | Admitting: Family Medicine

## 2017-08-04 VITALS — BP 111/71 | HR 127 | Temp 97.4°F | Resp 24 | Ht 65.5 in | Wt 191.0 lb

## 2017-08-04 DIAGNOSIS — I2510 Atherosclerotic heart disease of native coronary artery without angina pectoris: Secondary | ICD-10-CM | POA: Diagnosis not present

## 2017-08-04 DIAGNOSIS — N183 Chronic kidney disease, stage 3 unspecified: Secondary | ICD-10-CM

## 2017-08-04 DIAGNOSIS — J449 Chronic obstructive pulmonary disease, unspecified: Secondary | ICD-10-CM

## 2017-08-04 DIAGNOSIS — I482 Chronic atrial fibrillation, unspecified: Secondary | ICD-10-CM

## 2017-08-04 DIAGNOSIS — I480 Paroxysmal atrial fibrillation: Secondary | ICD-10-CM

## 2017-08-04 DIAGNOSIS — I130 Hypertensive heart and chronic kidney disease with heart failure and stage 1 through stage 4 chronic kidney disease, or unspecified chronic kidney disease: Secondary | ICD-10-CM | POA: Diagnosis not present

## 2017-08-04 DIAGNOSIS — J9621 Acute and chronic respiratory failure with hypoxia: Secondary | ICD-10-CM | POA: Diagnosis not present

## 2017-08-04 DIAGNOSIS — Z5181 Encounter for therapeutic drug level monitoring: Secondary | ICD-10-CM | POA: Diagnosis not present

## 2017-08-04 DIAGNOSIS — E1122 Type 2 diabetes mellitus with diabetic chronic kidney disease: Secondary | ICD-10-CM | POA: Diagnosis not present

## 2017-08-04 DIAGNOSIS — I5033 Acute on chronic diastolic (congestive) heart failure: Secondary | ICD-10-CM

## 2017-08-04 DIAGNOSIS — N179 Acute kidney failure, unspecified: Secondary | ICD-10-CM | POA: Diagnosis not present

## 2017-08-04 DIAGNOSIS — J441 Chronic obstructive pulmonary disease with (acute) exacerbation: Secondary | ICD-10-CM | POA: Diagnosis not present

## 2017-08-04 DIAGNOSIS — J9611 Chronic respiratory failure with hypoxia: Secondary | ICD-10-CM | POA: Diagnosis not present

## 2017-08-04 LAB — POCT INR: INR: 2.9

## 2017-08-04 NOTE — Progress Notes (Signed)
08/04/2017  CC:  Chief Complaint  Patient presents with  . Hospitalization Follow-up    Patient is a 81 y.o. Caucasian male who presents for  hospital follow up. Dates hospitalized: 10/30-10/31, 2018. Days since d/c from hospital: 16 Patient was discharged from hospital to home. Reason for admission to hospital: acute hypoxic resp failure secondary to COPD exacerbation and acute on chronic diastolic HF.  I have reviewed patient's discharge summary plus pertinent specific notes, labs, and imaging from the hospitalization.    Feels back to baseline resp function. He thinks that forgetting his prednisone x 3 days was the reason for recent acute worsening/hospitalization. Eating and drinking normal.  No increased swelling in legs noted recently.  [facility] nurse has been visiting him twice per week since d/c, blood was drawn this morning for upcoming visit with Dr. Oletta Darter still needs to schedule this appt.  Medication reconciliation was done today and patient is taking meds as recommended by discharging hospitalist/specialist.   Taking lasix 40mg  once every day.  He finished his pred taper and is back on his chronic daily dose of 15mg  qd. Says he immediately gets worse SOB when he begins to walk.  Also, has baseline mild SOB at rest. He says he misses 2 doses of lasix per week on average. Has not taken his lasix yet today.  PMH:  Past Medical History:  Diagnosis Date  . Adrenal adenoma   . Atrial fibrillation (HCC)    Rate control + Coumadin  . Atrial flutter ([place])   . CAD (coronary artery disease)    Lexiscan Myoview (01/2014): Lateral soft tissue attenuation, no ischemia, not gated, low risk  . CAP (community acquired pneumonia)    Hospitalized 10/2014  . Chronic hypoxemic respiratory failure (HCC)    from COPD  . Chronic renal insufficiency, stage III (moderate) (HCC)    CrCl 50s  . COPD (chronic obstructive pulmonary disease) (HCC)    oxygen-dependent (2.5 L 24/7).  Improved  on starting chronic prednisone 06/2017.  . Diabetes mellitus without complication ([place]) 88/41/6606   A1c 6.5 % 05/2016  . Diverticulosis   . Dyslipidemia   . Dyspnea    Multifactorial: deconditioning, COPD, obesity hypoventilation syndrome.  . Embolic stroke ([place])    d/t endocarditis 2008  . Hemorrhoids   . History of mitral valve replacement    needs SBE prophylaxis  . History of prostate cancer remote past   f/u by Dr. Claris Che; no recurrence as of f/u 10/2015.  PSA rising from 2013 to 06/2016: as of 02/2017 plan is bone scan (CT if PSA gets above 5).  Marland Kitchen HTN (hypertension)   . Mediastinal lymphadenopathy   . Pulmonary hypertension ([place]) 01/09/2015   secondary  . Right thyroid nodule   . Thrombocytopenia ([place])   . TIA (transient ischemic attack) 03/25/2011   ? of due to transient diplopia  . Ulcerative colitis    left-sided/segmental, associated with diverticulosis.  Sulfasalazine per GI (Dr. Carlean Purl).  . Warthin's tumor     PSH:  Past Surgical History:  Procedure Laterality Date  . carotid duplex dopplers  04/2014   No signif plaques; repeat 2 yrs per cardiology  . CATARACT EXTRACTION, BILATERAL    . COLONOSCOPY W/ BIOPSIES  12/19/2007   left-sided colitis, diverticulosis, hemorrhoids  . FLEXIBLE SIGMOIDOSCOPY  01/11/2008; 03/19/2010   2009 and 2011:segmental left colitis, diverticulosis, hemorrhoids  . INGUINAL HERNIA REPAIR     right  . MITRAL VALVE REPAIR  09/22/04   and modified Cox-Maze IV   .  MITRAL VALVE REPAIR  09/14/05   redo MV repair d/t endocardiits/embolic events  . PROSTATECTOMY  06/2003   Radical  . RIGHT HEART CATH N/A 01/01/2015   Performed by Martinique, Peter M, MD at [facility] C[facility]  . SALIVARY GLAND SURGERY     left resection  . TRANSTHORACIC ECHOCARDIOGRAM  11/2013; 07/2017   2015: Mod LVH, EF 55-60%, no WM abnormalities, +LA dilation, +severe RV dilation and impaired systolic fxn, +increased pulm art pressures.  Valves ok.  2018- EF 60-65%, wall motion nl, s/p MV  repair, mild RV syst dysf.    MEDS:  Outpatient Medications Prior to Visit  Medication Sig Dispense Refill  . albuterol (PROVENTIL HFA;VENTOLIN HFA) 108 (90 Base) MCG/ACT inhaler Inhale 1 puff into the lungs every 6 (six) hours as needed for wheezing or shortness of breath (using 5-6 times every 24 hrs). 3 Inhaler 2  . budesonide (PULMICORT) 0.5 MG/2ML nebulizer solution USE ONE VIAL IN NEBULIZER TWICE DAILY 120 mL 5  . digoxin (LANOXIN) 0.125 MG tablet Take 1 tablet (0.125 mg total) by mouth daily. 90 tablet 3  . diltiazem (CARDIZEM CD) 240 MG 24 hr capsule Take 1 capsule (240 mg total) by mouth daily. 90 capsule 3  . furosemide (LASIX) 40 MG tablet Take one (1) tablet by mouth alternating with half (1/2) tablet by mouth daily. 90 tablet 3  . LORazepam (ATIVAN) 0.5 MG tablet Take 1 tablet (0.5 mg total) by mouth 2 (two) times daily as needed for anxiety. 30 tablet 5  . olopatadine (PATANOL) 0.1 % ophthalmic solution Place 1 drop into both eyes 2 (two) times daily. 5 mL 12  . pravastatin (PRAVACHOL) 40 MG tablet Take 1 tablet (40 mg total) by mouth every evening. 90 tablet 2  . predniSONE (DELTASONE) 10 MG tablet Take total 30 mg daily starting 11/1, then 11/2 and 11/3 then resume 15 mg daily. 60 tablet 0  . Tiotropium Bromide-Olodaterol (STIOLTO RESPIMAT) 2.5-2.5 MCG/ACT AERS Inhale 2 puffs into the lungs daily. 3 Inhaler 3  . warfarin (COUMADIN) 5 MG tablet Take 1 to 1.5 tablets by mouth daily as directed by coumadin clinic (Patient taking differently: Take 5 mg by mouth daily at 6 PM. ) 105 tablet 1   No facility-administered medications prior to visit.    EXAM: BP 111/71 (BP Location: Left Arm, Patient Position: Sitting, Cuff Size: Normal)   Pulse (!) 127   Temp (!) 97.4 F (36.3 C) (Oral)   Resp (!) 24   Ht 5' 5.5" (1.664 m)   Wt 191 lb (86.6 kg)   SpO2 93%   BMI 31.30 kg/m  on 2L oxygen. Weight is down 1 lb compared to last o/v here on 07/18/17. Gen: Alert, chronically ill  appearing.  Patient is oriented to person, place, time, and situation. AFFECT: pleasant, lucid thought and speech. CV: Irreg irreg rhythm, rate around 100, no m/r. LUNGS: Bibasilar insp crackles in both bases, L>R.  Nonlabored resps, good aeration. EXT: 2+ pitting edema in both ankles.   Pertinent labs/imaging Lab Results  Component Value Date   TSH 3.24 12/09/2014   Lab Results  Component Value Date   WBC 11.2 (H) 07/19/2017   HGB 14.0 07/19/2017   HCT 41.4 07/19/2017   MCV 85.4 07/19/2017   PLT 173 07/19/2017   Lab Results  Component Value Date   CREATININE 1.45 (H) 07/19/2017   BUN 18 07/19/2017   NA 135 07/19/2017   K 4.1 07/19/2017   CL 101 07/19/2017  CO2 22 07/19/2017   Lab Results  Component Value Date   ALT 17 07/19/2017   AST 18 07/19/2017   ALKPHOS 56 07/19/2017   BILITOT 1.2 07/19/2017   Lab Results  Component Value Date   CHOL 143 10/12/2016   Lab Results  Component Value Date   HDL 44.20 10/12/2016   Lab Results  Component Value Date   LDLCALC 79 10/12/2016   Lab Results  Component Value Date   TRIG 100.0 10/12/2016   Lab Results  Component Value Date   CHOLHDL 3 10/12/2016    ASSESSMENT/PLAN:  Hospital f/u:  1) Acute COPD exacerbation: he is back to baseline from this standpoint. Continue 15mg  prednisone qd, pulmicort, stiolto respimat.  2) Acute-on-chronic diastolic HF: I am worried he may have a little bit of pulm edema today based on his lung sounds. He is in no distress, is on his baseline oxygen supplementation and O2 sat is good. He is a bit unreliable regarding compliance with lasix. I will boost his lasix dose a little for today and tomorrow (60 mg qd each day), then resume 40mg  qd on "Sunday the 18th. F/u in clinic on Monday, 08/07/17. Continue to monitor home bp and HR.  3) A-fib: on rate control and coumadin. He is borderline RVR today.  However, given his low-normal bp and his possibility of mild acute HF I am hesitant  to increase his digoxin or diltiazem.  Hopefully diuresis will help this some. He is getting INR followed appropriately by coumadin clinic---pt says INR was 2.9 yesterday.  4) CRI stage 3: he had mild acute kidney injury in hosp. HH drew blood today for Dr. Crenshaw so I won't repeat this. Will try to get these results at pt's f/u appt here in 3d, plus repeat BMET at that time.  An After Visit Summary was printed and given to the patient.  FOLLOW UP:  3d  Signed:  Phil , MD           11" /16/2018

## 2017-08-04 NOTE — Patient Instructions (Signed)
Take 1 AND 1/2 of your lasix 40 mg (furosemide) pills today and tomorrow. Then, on Sunday, go back to one tab once daily.

## 2017-08-04 NOTE — Telephone Encounter (Signed)
[name] ([facility]) called with PT INR results   PT  30.2  INR 2.9  Please call [phone].

## 2017-08-04 NOTE — Telephone Encounter (Signed)
Spoke with Angie and pt and appt already scheduled for office.

## 2017-08-06 DIAGNOSIS — I2510 Atherosclerotic heart disease of native coronary artery without angina pectoris: Secondary | ICD-10-CM | POA: Diagnosis not present

## 2017-08-06 DIAGNOSIS — I130 Hypertensive heart and chronic kidney disease with heart failure and stage 1 through stage 4 chronic kidney disease, or unspecified chronic kidney disease: Secondary | ICD-10-CM | POA: Diagnosis not present

## 2017-08-06 DIAGNOSIS — J441 Chronic obstructive pulmonary disease with (acute) exacerbation: Secondary | ICD-10-CM | POA: Diagnosis not present

## 2017-08-06 DIAGNOSIS — I5033 Acute on chronic diastolic (congestive) heart failure: Secondary | ICD-10-CM | POA: Diagnosis not present

## 2017-08-06 DIAGNOSIS — E1122 Type 2 diabetes mellitus with diabetic chronic kidney disease: Secondary | ICD-10-CM | POA: Diagnosis not present

## 2017-08-06 DIAGNOSIS — J9621 Acute and chronic respiratory failure with hypoxia: Secondary | ICD-10-CM | POA: Diagnosis not present

## 2017-08-07 ENCOUNTER — Encounter: Payer: Self-pay | Admitting: Family Medicine

## 2017-08-07 ENCOUNTER — Ambulatory Visit (HOSPITAL_BASED_OUTPATIENT_CLINIC_OR_DEPARTMENT_OTHER)
Admission: RE | Admit: 2017-08-07 | Discharge: 2017-08-07 | Disposition: A | Discharge status: Home / Self Care | Payer: Medicare Other | Source: Ambulatory Visit | Attending: Family Medicine | Admitting: Family Medicine

## 2017-08-07 ENCOUNTER — Ambulatory Visit (INDEPENDENT_AMBULATORY_CARE_PROVIDER_SITE_OTHER): Payer: Medicare Other | Admitting: *Deleted

## 2017-08-07 ENCOUNTER — Other Ambulatory Visit: Payer: Self-pay

## 2017-08-07 ENCOUNTER — Ambulatory Visit (INDEPENDENT_AMBULATORY_CARE_PROVIDER_SITE_OTHER): Payer: Medicare Other | Admitting: Family Medicine

## 2017-08-07 VITALS — BP 104/66 | HR 125 | Temp 97.5°F | Resp 18 | Ht 65.5 in | Wt 191.5 lb

## 2017-08-07 DIAGNOSIS — J9611 Chronic respiratory failure with hypoxia: Secondary | ICD-10-CM

## 2017-08-07 DIAGNOSIS — Z7901 Long term (current) use of anticoagulants: Secondary | ICD-10-CM

## 2017-08-07 DIAGNOSIS — Z8679 Personal history of other diseases of the circulatory system: Secondary | ICD-10-CM

## 2017-08-07 DIAGNOSIS — I5033 Acute on chronic diastolic (congestive) heart failure: Secondary | ICD-10-CM

## 2017-08-07 DIAGNOSIS — I482 Chronic atrial fibrillation, unspecified: Secondary | ICD-10-CM

## 2017-08-07 DIAGNOSIS — N183 Chronic kidney disease, stage 3 unspecified: Secondary | ICD-10-CM

## 2017-08-07 DIAGNOSIS — Z5181 Encounter for therapeutic drug level monitoring: Secondary | ICD-10-CM | POA: Diagnosis not present

## 2017-08-07 DIAGNOSIS — I2510 Atherosclerotic heart disease of native coronary artery without angina pectoris: Secondary | ICD-10-CM | POA: Diagnosis not present

## 2017-08-07 DIAGNOSIS — I4892 Unspecified atrial flutter: Secondary | ICD-10-CM

## 2017-08-07 DIAGNOSIS — R000 Tachycardia, unspecified: Secondary | ICD-10-CM | POA: Diagnosis not present

## 2017-08-07 DIAGNOSIS — R0602 Shortness of breath: Secondary | ICD-10-CM | POA: Diagnosis not present

## 2017-08-07 LAB — BASIC METABOLIC PANEL
BUN: 17 mg/dL (ref 6–23)
CALCIUM: 9.3 mg/dL (ref 8.4–10.5)
CO2: 27 mEq/L (ref 19–32)
Chloride: 103 mEq/L (ref 96–112)
Creatinine, Ser: 1.12 mg/dL (ref 0.40–1.50)
GFR: 67.11 mL/min (ref >60.00)
GLUCOSE: 159 mg/dL — AB (ref 70–99)
POTASSIUM: 3.9 meq/L (ref 3.5–5.1)
SODIUM: 138 meq/L (ref 135–145)

## 2017-08-07 LAB — POCT INR: INR: 3.5

## 2017-08-07 NOTE — Patient Instructions (Signed)
Skip today's dose, then start taking 1 tablet (5mg ) daily except 1/2 tablet on Friday while taking Prednisone (dose may change end of November). Recheck INR next week.  Call with new medications or any changes [phone]. Be sure to keep intake of greens consistent

## 2017-08-07 NOTE — Progress Notes (Addendum)
OFFICE VISIT  08/07/2017   CC:  Chief Complaint  Patient presents with  . Follow-up    3 day f/u from HFU    HPI:    Patient is a 81 y.o. Caucasian male who presents accompanied by his wife for 3 day f/u what I felt was early acute-on-chronic CHF. I gave him 50% increased dosing of lasix x 2 d, then resume 40mg  qd yesterday. He remains on his usual 2L oxygen and continues to oxygenate adequately. Says his w/e was normal---no change from what he feels like is his baseline.  No fever, no CP, no palpitations or feeling of heart racing, no dizziness.  Denies change in LE swelling. Wt is 0.8 lbs up from last visit 3 d/a.  No signif cough, no wheezing.  Past Medical History:  Diagnosis Date  . Adrenal adenoma   . Atrial fibrillation (HCC)    Rate control + Coumadin  . Atrial flutter ([place])   . CAD (coronary artery disease)    Lexiscan Myoview (01/2014): Lateral soft tissue attenuation, no ischemia, not gated, low risk  . CAP (community acquired pneumonia)    Hospitalized 10/2014  . Chronic hypoxemic respiratory failure (HCC)    from COPD  . Chronic renal insufficiency, stage III (moderate) (HCC)    CrCl 50s  . COPD (chronic obstructive pulmonary disease) (HCC)    oxygen-dependent (2.5 L 24/7).  Improved on starting chronic prednisone 06/2017.  . Diabetes mellitus without complication ([place]) 65/46/5035   A1c 6.5 % 05/2016  . Diverticulosis   . Dyslipidemia   . Dyspnea    Multifactorial: deconditioning, COPD, obesity hypoventilation syndrome.  . Embolic stroke ([place])    d/t endocarditis 2008  . Hemorrhoids   . History of mitral valve replacement    needs SBE prophylaxis  . History of prostate cancer remote past   f/u by Dr. Claris Che; no recurrence as of f/u 10/2015.  PSA rising from 2013 to 06/2016: as of 02/2017 plan is bone scan (CT if PSA gets above 5).  Marland Kitchen HTN (hypertension)   . Mediastinal lymphadenopathy   . Pulmonary hypertension ([place]) 01/09/2015   secondary  . Right  thyroid nodule   . Thrombocytopenia ([place])   . TIA (transient ischemic attack) 03/25/2011   ? of due to transient diplopia  . Ulcerative colitis    left-sided/segmental, associated with diverticulosis.  Sulfasalazine per GI (Dr. Carlean Purl).  . Warthin's tumor     Past Surgical History:  Procedure Laterality Date  . carotid duplex dopplers  04/2014   No signif plaques; repeat 2 yrs per cardiology  . CATARACT EXTRACTION, BILATERAL    . COLONOSCOPY W/ BIOPSIES  12/19/2007   left-sided colitis, diverticulosis, hemorrhoids  . FLEXIBLE SIGMOIDOSCOPY  01/11/2008; 03/19/2010   2009 and 2011:segmental left colitis, diverticulosis, hemorrhoids  . INGUINAL HERNIA REPAIR     right  . MITRAL VALVE REPAIR  09/22/04   and modified Cox-Maze IV   . MITRAL VALVE REPAIR  09/14/05   redo MV repair d/t endocardiits/embolic events  . PROSTATECTOMY  06/2003   Radical  . RIGHT HEART CATH N/A 01/01/2015   Performed by Martinique, Peter M, MD at [facility] CATH LAB  . SALIVARY GLAND SURGERY     left resection  . TRANSTHORACIC ECHOCARDIOGRAM  11/2013; 07/2017   2015: Mod LVH, EF 55-60%, no WM abnormalities, +LA dilation, +severe RV dilation and impaired systolic fxn, +increased pulm art pressures.  Valves ok.  2018- EF 60-65%, wall motion nl, s/p MV repair, mild RV  syst dysf.    Outpatient Medications Prior to Visit  Medication Sig Dispense Refill  . albuterol (PROVENTIL HFA;VENTOLIN HFA) 108 (90 Base) MCG/ACT inhaler Inhale 1 puff into the lungs every 6 (six) hours as needed for wheezing or shortness of breath (using 5-6 times every 24 hrs). 3 Inhaler 2  . budesonide (PULMICORT) 0.5 MG/2ML nebulizer solution USE ONE VIAL IN NEBULIZER TWICE DAILY 120 mL 5  . digoxin (LANOXIN) 0.125 MG tablet Take 1 tablet (0.125 mg total) by mouth daily. 90 tablet 3  . diltiazem (CARDIZEM CD) 240 MG 24 hr capsule Take 1 capsule (240 mg total) by mouth daily. 90 capsule 3  . furosemide (LASIX) 40 MG tablet Take one (1) tablet by mouth alternating  with half (1/2) tablet by mouth daily. 90 tablet 3  . LORazepam (ATIVAN) 0.5 MG tablet Take 1 tablet (0.5 mg total) by mouth 2 (two) times daily as needed for anxiety. 30 tablet 5  . olopatadine (PATANOL) 0.1 % ophthalmic solution Place 1 drop into both eyes 2 (two) times daily. 5 mL 12  . pravastatin (PRAVACHOL) 40 MG tablet Take 1 tablet (40 mg total) by mouth every evening. 90 tablet 2  . predniSONE (DELTASONE) 10 MG tablet Take total 30 mg daily starting 11/1, then 11/2 and 11/3 then resume 15 mg daily. 60 tablet 0  . Tiotropium Bromide-Olodaterol (STIOLTO RESPIMAT) 2.5-2.5 MCG/ACT AERS Inhale 2 puffs into the lungs daily. 3 Inhaler 3  . warfarin (COUMADIN) 5 MG tablet Take 1 to 1.5 tablets by mouth daily as directed by coumadin clinic (Patient taking differently: Take 5 mg by mouth daily at 6 PM. ) 105 tablet 1   No facility-administered medications prior to visit.     No Known Allergies  ROS As per HPI  PE: Blood pressure 104/66, pulse (!) 125, temperature (!) 97.5 F (36.4 C), temperature source Oral, resp. rate 18, height 5' 5.5" (1.664 m), weight 191 lb 8 oz (86.9 kg), SpO2 93 %. Gen: alert, oriented x 4, NAD.  Doesn't appear acutely ill.  Minimal SOB at rest today---better compared to last visit. CV: regular, tachy to 120s.  No rub. LUNGS: bibasilar insp crackles in both bases, L>R--also goes further up the base on L side.   Aeration is good, no wheezing or coughing with exam.  Nonlabored resps. EXT; 1+ pitting edema in both ankles  LABS:    Chemistry      Component Value Date/Time   NA 135 07/19/2017 0745   K 4.1 07/19/2017 0745   CL 101 07/19/2017 0745   CO2 22 07/19/2017 0745   BUN 18 07/19/2017 0745   CREATININE 1.45 (H) 07/19/2017 0745      Component Value Date/Time   CALCIUM 9.6 07/19/2017 0745   ALKPHOS 56 07/19/2017 0745   AST 18 07/19/2017 0745   ALT 17 07/19/2017 0745   BILITOT 1.2 07/19/2017 0745      IMPRESSION AND PLAN:  Chronic hypoxic resp  failure: pt feels like he is at baseline status. However, I am concerned about his tachycardia.  His crackles in bases could represent some edema vs atelectasis vs scarring. Will not change any meds at this time, but I want to check a BMET and a CXR today. If CXR not showing obvious acute pulm edema, will likely increase his dig for better rate control.  His bp is too low to push his cardizem CD dose any.  If pulm edema evident on CXR and if renal function stable, will then  push diuresis more and keep close f/u.  An After Visit Summary was printed and given to the patient.  FOLLOW UP: f/u to be determined based on results of testing today.  Signed:  Crissie Sickles, MD           08/07/2017  ADDENDUM 5:30 pm, 08/07/17: Discussed results with pt and his son Gerald Stabs. Renal function back to pt's baseline, lytes normal. CXR showed he still has mild CHF. Increase lasix to 80mg  qd today and tomorrow, then 60 mg once daily until I see him again for f/u in 1 wk. Will recheck BMET at that time.  Signed:  Crissie Sickles, MD           08/07/2017

## 2017-08-08 ENCOUNTER — Other Ambulatory Visit: Payer: Self-pay

## 2017-08-08 NOTE — Patient Outreach (Signed)
[facility]) Care Management  08/08/2017  [name] [birth year] [id number]      EMMI-COPD RED ON EMMI ALERT Day #1 Date:07/21/17 Red Alert Reason:"Feeling worse overall? Yes Mucus change color? Yes" Day # 3 Date: 07/22/17 Red Alert Reason: " # of times rescue inhaler used in past 24 hrs? 7"    Multiple attempts to establish contact with patient without success. No response from letter mailed to patient. Case is being closed at this time.    Plan: RN CM will notify [facility] administrative assistant of case status.    Enzo Montgomery, RN,BSN,CCM [facility] Telephonic Care Management Coordinator Direct Phone: [phone] Toll Free: [phone] Fax: [phone]

## 2017-08-14 ENCOUNTER — Telehealth: Payer: Self-pay | Admitting: Family Medicine

## 2017-08-14 ENCOUNTER — Ambulatory Visit (INDEPENDENT_AMBULATORY_CARE_PROVIDER_SITE_OTHER): Payer: Medicare Other | Admitting: *Deleted

## 2017-08-14 ENCOUNTER — Other Ambulatory Visit: Payer: Self-pay

## 2017-08-14 ENCOUNTER — Ambulatory Visit (INDEPENDENT_AMBULATORY_CARE_PROVIDER_SITE_OTHER): Payer: Medicare Other | Admitting: Family Medicine

## 2017-08-14 ENCOUNTER — Encounter: Payer: Self-pay | Admitting: Family Medicine

## 2017-08-14 VITALS — BP 121/66 | HR 81 | Temp 97.4°F | Resp 20 | Ht 65.5 in | Wt 192.2 lb

## 2017-08-14 DIAGNOSIS — Z5181 Encounter for therapeutic drug level monitoring: Secondary | ICD-10-CM | POA: Diagnosis not present

## 2017-08-14 DIAGNOSIS — I4892 Unspecified atrial flutter: Secondary | ICD-10-CM | POA: Diagnosis not present

## 2017-08-14 DIAGNOSIS — F411 Generalized anxiety disorder: Secondary | ICD-10-CM

## 2017-08-14 DIAGNOSIS — I2510 Atherosclerotic heart disease of native coronary artery without angina pectoris: Secondary | ICD-10-CM

## 2017-08-14 DIAGNOSIS — Z7901 Long term (current) use of anticoagulants: Secondary | ICD-10-CM

## 2017-08-14 DIAGNOSIS — I482 Chronic atrial fibrillation, unspecified: Secondary | ICD-10-CM

## 2017-08-14 DIAGNOSIS — I5033 Acute on chronic diastolic (congestive) heart failure: Secondary | ICD-10-CM

## 2017-08-14 DIAGNOSIS — Z8679 Personal history of other diseases of the circulatory system: Secondary | ICD-10-CM | POA: Diagnosis not present

## 2017-08-14 LAB — BASIC METABOLIC PANEL
BUN: 25 mg/dL — ABNORMAL HIGH (ref 6–23)
CHLORIDE: 100 meq/L (ref 96–112)
CO2: 29 meq/L (ref 19–32)
Calcium: 9.3 mg/dL (ref 8.4–10.5)
Creatinine, Ser: 1.16 mg/dL (ref 0.40–1.50)
GFR: 64.44 mL/min (ref >60.00)
Glucose, Bld: 216 mg/dL — ABNORMAL HIGH (ref 70–99)
POTASSIUM: 3.9 meq/L (ref 3.5–5.1)
SODIUM: 139 meq/L (ref 135–145)

## 2017-08-14 LAB — POCT INR: INR: 2.3

## 2017-08-14 MED ORDER — LORAZEPAM 0.5 MG PO TABS: 0.5000 mg | ORAL_TABLET | Freq: Two times a day (BID) | ORAL | 2 refills | Status: DC | PRN

## 2017-08-14 MED ORDER — FUROSEMIDE 40 MG PO TABS: ORAL_TABLET | ORAL | 3 refills | Status: DC

## 2017-08-14 NOTE — Telephone Encounter (Signed)
Heather: pls notify pt that all labs stable. Continue with the plan of taking 60mg  of lasix every day.  Lattie Haw: can you add a Hba1c to these labs?  Dx is diabetes without complications.-thx

## 2017-08-14 NOTE — Progress Notes (Signed)
OFFICE VISIT  08/14/2017   CC:  Chief Complaint  Patient presents with  . Follow-up    Resp. Failure     HPI:    Patient is a 81 y.o. Caucasian male who presents accompanied by his wife for 7 day f/u acute-on-chronic diastolic HF. Have been up-titrating lasix lately due to mild pulm edema---although patient has felt like he is at baseline resp function during this time.  Says he feels fine, no acute complaints. He has noted increased urination since being on increased dose of lasix. Says breathing is at his baseline, as he has said all along through this.  Still using 2L [place] 24/7. Says legs not swelling.  HR is better today.  Wt is the same.  BP normal here today.  Past Medical History:  Diagnosis Date  . Adrenal adenoma   . Atrial fibrillation (HCC)    Rate control + Coumadin  . Atrial flutter ([place])   . CAD (coronary artery disease)    Lexiscan Myoview (01/2014): Lateral soft tissue attenuation, no ischemia, not gated, low risk  . CAP (community acquired pneumonia)    Hospitalized 10/2014  . Chronic diastolic heart failure ([place])   . Chronic hypoxemic respiratory failure (HCC)    from COPD  . Chronic renal insufficiency, stage III (moderate) (HCC)    CrCl 50s  . COPD (chronic obstructive pulmonary disease) (HCC)    oxygen-dependent (2.5 L 24/7).  Improved on starting chronic prednisone 06/2017.  . Diabetes mellitus without complication ([place]) 50/53/9767   A1c 6.5 % 05/2016  . Diverticulosis   . Dyslipidemia   . Dyspnea    Multifactorial: deconditioning, COPD, obesity hypoventilation syndrome.  . Embolic stroke ([place])    d/t endocarditis 2008  . Hemorrhoids   . History of mitral valve replacement    needs SBE prophylaxis  . History of prostate cancer remote past   f/u by Dr. Claris Che; no recurrence as of f/u 10/2015.  PSA rising from 2013 to 06/2016: as of 02/2017 plan is bone scan (CT if PSA gets above 5).  Marland Kitchen HTN (hypertension)   . Mediastinal lymphadenopathy   .  Pulmonary hypertension ([place]) 01/09/2015   secondary  . Right thyroid nodule   . Thrombocytopenia ([place])   . TIA (transient ischemic attack) 03/25/2011   ? of due to transient diplopia  . Ulcerative colitis    left-sided/segmental, associated with diverticulosis.  Sulfasalazine per GI (Dr. Carlean Purl).  . Warthin's tumor     Past Surgical History:  Procedure Laterality Date  . carotid duplex dopplers  04/2014   No signif plaques; repeat 2 yrs per cardiology  . CATARACT EXTRACTION, BILATERAL    . COLONOSCOPY W/ BIOPSIES  12/19/2007   left-sided colitis, diverticulosis, hemorrhoids  . FLEXIBLE SIGMOIDOSCOPY  01/11/2008; 03/19/2010   2009 and 2011:segmental left colitis, diverticulosis, hemorrhoids  . INGUINAL HERNIA REPAIR     right  . MITRAL VALVE REPAIR  09/22/04   and modified Cox-Maze IV   . MITRAL VALVE REPAIR  09/14/05   redo MV repair d/t endocardiits/embolic events  . PROSTATECTOMY  06/2003   Radical  . RIGHT HEART CATHETERIZATION N/A 01/01/2015   Procedure: RIGHT HEART CATH;  Surgeon: Peter M Martinique, MD;  Location: [facility] CATH LAB;  Service: Cardiovascular;  Laterality: N/A;  . SALIVARY GLAND SURGERY     left resection  . TRANSTHORACIC ECHOCARDIOGRAM  11/2013; 07/2017   2015: Mod LVH, EF 55-60%, no WM abnormalities, +LA dilation, +severe RV dilation and impaired systolic fxn, +increased pulm  art pressures.  Valves ok.  2018- EF 60-65%, wall motion nl, s/p MV repair, mild RV syst dysf.    Outpatient Medications Prior to Visit  Medication Sig Dispense Refill  . albuterol (PROVENTIL HFA;VENTOLIN HFA) 108 (90 Base) MCG/ACT inhaler Inhale 1 puff into the lungs every 6 (six) hours as needed for wheezing or shortness of breath (using 5-6 times every 24 hrs). 3 Inhaler 2  . budesonide (PULMICORT) 0.5 MG/2ML nebulizer solution USE ONE VIAL IN NEBULIZER TWICE DAILY 120 mL 5  . digoxin (LANOXIN) 0.125 MG tablet Take 1 tablet (0.125 mg total) by mouth daily. 90 tablet 3  . diltiazem (CARDIZEM CD) 240  MG 24 hr capsule Take 1 capsule (240 mg total) by mouth daily. 90 capsule 3  . olopatadine (PATANOL) 0.1 % ophthalmic solution Place 1 drop into both eyes 2 (two) times daily. 5 mL 12  . pravastatin (PRAVACHOL) 40 MG tablet Take 1 tablet (40 mg total) by mouth every evening. 90 tablet 2  . predniSONE (DELTASONE) 10 MG tablet Take total 30 mg daily starting 11/1, then 11/2 and 11/3 then resume 15 mg daily. 60 tablet 0  . Tiotropium Bromide-Olodaterol (STIOLTO RESPIMAT) 2.5-2.5 MCG/ACT AERS Inhale 2 puffs into the lungs daily. 3 Inhaler 3  . warfarin (COUMADIN) 5 MG tablet Take 1 to 1.5 tablets by mouth daily as directed by coumadin clinic (Patient taking differently: Take 5 mg by mouth daily at 6 PM. ) 105 tablet 1  . furosemide (LASIX) 40 MG tablet Take one (1) tablet by mouth alternating with half (1/2) tablet by mouth daily. 90 tablet 3  . LORazepam (ATIVAN) 0.5 MG tablet Take 1 tablet (0.5 mg total) by mouth 2 (two) times daily as needed for anxiety. (Patient not taking: Reported on 08/14/2017) 30 tablet 5   No facility-administered medications prior to visit.     No Known Allergies  ROS As per HPI  PE: Blood pressure 121/66, pulse 81, temperature (!) 97.4 F (36.3 C), temperature source Oral, resp. rate 20, height 5' 5.5" (1.664 m), weight 192 lb 4 oz (87.2 kg), SpO2 94 %. Gen: Alert, well appearing.  Patient is oriented to person, place, time, and situation. AFFECT: pleasant, lucid thought and speech. CV: RRR, HR 80s, no rub or gallop. Chest is clear, no wheezing or rales. Normal symmetric air entry throughout both lung fields. No chest wall deformities or tenderness. EXT: no clubbing, cyanosis, or edema.    LABS:    Chemistry      Component Value Date/Time   NA 139 08/14/2017 1116   K 3.9 08/14/2017 1116   CL 100 08/14/2017 1116   CO2 29 08/14/2017 1116   BUN 25 (H) 08/14/2017 1116   CREATININE 1.16 08/14/2017 1116      Component Value Date/Time   CALCIUM 9.3 08/14/2017  1116   ALKPHOS 56 07/19/2017 0745   AST 18 07/19/2017 0745   ALT 17 07/19/2017 0745   BILITOT 1.2 07/19/2017 0745       PA/Lat CXR 08/07/2018: IMPRESSION: 1. Findings are similar to the prior chest radiograph. Suspect mild congestive heart failure with vascular congestion. There may be a component of mild interstitial edema.   IMPRESSION AND PLAN:  Acute on chronic diastolic HF: no sign of volume overload anymore. Lungs clear, HR much improved, regular rhythm today. Will continue 60mg  qd lasix---this will be his new daily/chronic dosing as long as renal function remains stable. BMET today.  An After Visit Summary was printed and given to  the patient.  FOLLOW UP: Return in about 2 months (around 10/14/2017) for routine chronic illness f/u (30 min).  Signed:  Crissie Sickles, MD           08/14/2017

## 2017-08-14 NOTE — Patient Instructions (Signed)
Continue taking 1 tablet (5mg ) daily except 1/2 tablet on Friday while taking Prednisone (dose may change end of November). Recheck INR 2 weeks.  Call with new medications or any changes [phone]. Be sure to keep intake of greens consistent.   Cal after you have your visit with Dr. Lamonte Sakai so we can know your Prednisone dose.

## 2017-08-15 NOTE — Telephone Encounter (Signed)
Noted  

## 2017-08-15 NOTE — Telephone Encounter (Signed)
Sorry, I can't add A1C.  I didn't draw the lavender tube.

## 2017-08-15 NOTE — Telephone Encounter (Signed)
Pts wife advised and voiced understanding, okay per DPR. 

## 2017-08-18 ENCOUNTER — Encounter: Payer: Self-pay | Admitting: Emergency Medicine

## 2017-08-18 ENCOUNTER — Ambulatory Visit (INDEPENDENT_AMBULATORY_CARE_PROVIDER_SITE_OTHER): Payer: Medicare Other | Admitting: Emergency Medicine

## 2017-08-18 VITALS — BP 110/70 | HR 72 | Ht 65.5 in | Wt 191.0 lb

## 2017-08-18 DIAGNOSIS — I2510 Atherosclerotic heart disease of native coronary artery without angina pectoris: Secondary | ICD-10-CM | POA: Diagnosis not present

## 2017-08-18 DIAGNOSIS — J449 Chronic obstructive pulmonary disease, unspecified: Secondary | ICD-10-CM

## 2017-08-18 MED ORDER — PREDNISONE 20 MG PO TABS: 20.0000 mg | ORAL_TABLET | Freq: Every day | ORAL | 1 refills | Status: DC

## 2017-08-18 MED ORDER — PREDNISONE 20 MG PO TABS: 20.0000 mg | ORAL_TABLET | Freq: Every day | ORAL | 0 refills | Status: DC

## 2017-08-18 NOTE — Patient Instructions (Signed)
We will increase prednisone back to 20 mg daily until our next visit Please continue your oxygen as you have been using it Continue Stiolto once daily Use albuterol up to every 4 hours if needed for shortness of breath, wheezing, chest tightness Flu shot up-to-date Follow with Dr Lamonte Sakai in 2 months or sooner if you have any problems.

## 2017-08-18 NOTE — Assessment & Plan Note (Signed)
Continue current oxygen, 2 L/min pulse flow

## 2017-08-18 NOTE — Assessment & Plan Note (Signed)
Exacerbation recently, probably some crossover between his pulmonary edema and also COPD.  He does feel that he was clinically better when he was on prednisone 20.  I will increase him from 15 mg back to 20 mg daily.  Continue Stiolto and albuterol as needed.

## 2017-08-18 NOTE — Progress Notes (Signed)
Subjective:    Patient ID: [name], male    DOB: 10-14-1935, 81 y.o.   MRN: [id number]  HPI 81 year old man, former smoker (90 pack years), with a history of mitral valve repair, atrial fibrillation (status post Maze procedure), coronary artery disease, history of stroke. He has been followed in our office by [name] for COPD and chronic hypoxemic respiratory failure. He has been managed on budesonide nebulized twice a day, Xopenex nebulized 4 times a day on schedule. He feels that these help him. He uses albuterol HFA 3-4x a day. Wakes up at night once to take SABA. He was treated for an AE in January '16. He does not wheeze. He is limited activity, wants to be able to go fishing. He is wearing 2L/min at all time.    ROV 02/20/17 -- patient has a history of moderate-to-severe COPD with associated hypoxemic respiratory failure. Last seen in our office in April. He had a fall mid May while he was fishing, hurt his ankle, now improved. He reports that his breathing has been worse, slowly over the last several months. He believes that humidity bothers him as well. He is irritated by the O2 in his nose, no lesions or scabs. He did not get a POC, he says because medicare won;t pay for it. O2 set on 2-2.5L/min. He has seen desaturations with exertion, to 88%. He is on stiolto, uses SABA 2-3x a day. Some cough, daily, prod of clear to yellow. No wheezing.   ROV 05/18/17 -- Follow-up visit for moderate to severe COPD with associated hypoxemic respiratory failure. He has been managed with Stiolto, albuterol as needed. He feels that his breathing is worse since last time. No exertional tolerance, has to stop to rest even with O2 on. He has not had any increase in cough, wheeze. Productive of some thick mucous. Took mucinex for a few weeks, has now stopped it.   ROV 06/20/17 -- [name] has a history of moderate-to-severe COPD with associated hypoxemic respiratory failure. Since last time he was able to  get his [employer] portable concentrator, set on 2L/min pulsed. Last time we started prednisone. He tells me that it has made him "feel good", but isn't sure that has changed his endurance or exertional dyspnea very much. He has more energy, wants to get out and do things. He coughs a few times a day - no real change, no wheeze. He is planning to get flu shot w his PCP.   ROV 08/18/17 --81 year old gentleman who follows up today for COPD, moderate to severe obstruction by spirometry.  He has associated hypoxemic respiratory failure and uses 2 L/min pulsed flow.  At our last visit we had plan to decrease his prednisone from 20 mg to a goal of 10 mg daily.  Unfortunately 07/18/17 he was admitted with an acute exacerbation + probable pulm edema. He woke at night with severe dyspnea. His lasix has been increased since then, improved. He is currently on Pred 15mg . He has exertional dyspnea with any walking. He is interested in possibly going back up to 20mg . On Stiolto qd. He uses albuterol 4-5x a day.    No flowsheet data found.  Review of Systems As per HPI     Objective:   Physical Exam Vitals:   08/18/17 1027  BP: 110/70  Pulse: 72  SpO2: 95%  Weight: 191 lb (86.6 kg)  Height: 5' 5.5" (1.664 m)   Gen: Pleasant, elderly gentleman, in no distress,  normal  affect on 2 L/m  ENT: No lesions,  mouth clear,  oropharynx clear, no postnasal drip  Neck: No JVD, no stridor  Lungs: No use of accessory muscles, Distant but clear. No wheezing during a normal respiratory cycle or with a forced expiration.  Cardiovascular: distant, irregular, no murmur or gallops, no peripheral edema  Musculoskeletal: No deformities, no cyanosis or clubbing  Neuro: alert, non focal  Skin: Warm, bruising on both deltoids      Assessment & Plan:  COPD (chronic obstructive pulmonary disease) with emphysema Exacerbation recently, probably some crossover between his pulmonary edema and also COPD.  He does feel that he  was clinically better when he was on prednisone 20.  I will increase him from 15 mg back to 20 mg daily.  Continue Stiolto and albuterol as needed.  Chronic hypoxemic respiratory failure (HCC) Continue current oxygen, 2 L/min pulse flow  Shaun Apo, MD, PhD 08/18/2017, 10:53 AM [name] [phone] or if no answer [phone]

## 2017-08-25 DIAGNOSIS — J9621 Acute and chronic respiratory failure with hypoxia: Secondary | ICD-10-CM | POA: Diagnosis not present

## 2017-08-25 DIAGNOSIS — I2510 Atherosclerotic heart disease of native coronary artery without angina pectoris: Secondary | ICD-10-CM | POA: Diagnosis not present

## 2017-08-25 DIAGNOSIS — E1122 Type 2 diabetes mellitus with diabetic chronic kidney disease: Secondary | ICD-10-CM | POA: Diagnosis not present

## 2017-08-25 DIAGNOSIS — I130 Hypertensive heart and chronic kidney disease with heart failure and stage 1 through stage 4 chronic kidney disease, or unspecified chronic kidney disease: Secondary | ICD-10-CM | POA: Diagnosis not present

## 2017-08-25 DIAGNOSIS — I5033 Acute on chronic diastolic (congestive) heart failure: Secondary | ICD-10-CM | POA: Diagnosis not present

## 2017-08-25 DIAGNOSIS — J441 Chronic obstructive pulmonary disease with (acute) exacerbation: Secondary | ICD-10-CM | POA: Diagnosis not present

## 2017-08-30 ENCOUNTER — Ambulatory Visit (INDEPENDENT_AMBULATORY_CARE_PROVIDER_SITE_OTHER): Payer: Medicare Other | Admitting: *Deleted

## 2017-08-30 DIAGNOSIS — I4892 Unspecified atrial flutter: Secondary | ICD-10-CM

## 2017-08-30 DIAGNOSIS — I679 Cerebrovascular disease, unspecified: Secondary | ICD-10-CM

## 2017-08-30 DIAGNOSIS — I482 Chronic atrial fibrillation, unspecified: Secondary | ICD-10-CM

## 2017-08-30 DIAGNOSIS — Z7901 Long term (current) use of anticoagulants: Secondary | ICD-10-CM

## 2017-08-30 DIAGNOSIS — Z5181 Encounter for therapeutic drug level monitoring: Secondary | ICD-10-CM | POA: Diagnosis not present

## 2017-08-30 DIAGNOSIS — Z8679 Personal history of other diseases of the circulatory system: Secondary | ICD-10-CM | POA: Diagnosis not present

## 2017-08-30 LAB — POCT INR: INR: 3.2

## 2017-08-30 NOTE — Patient Instructions (Addendum)
    Do not take coumadin today Dec 12th then do dose change as instructed  on previous visits   1 tablet (5mg ) daily except 1/2 tablet (2.5mg )  on Fridays while taking Prednisone . Recheck INR 2 weeks.  Call with new medications or any changes [phone]. Be sure to keep intake of greens consistent

## 2017-08-31 ENCOUNTER — Encounter: Payer: Self-pay | Admitting: Family Medicine

## 2017-09-06 DIAGNOSIS — J9621 Acute and chronic respiratory failure with hypoxia: Secondary | ICD-10-CM | POA: Diagnosis not present

## 2017-09-06 DIAGNOSIS — I5033 Acute on chronic diastolic (congestive) heart failure: Secondary | ICD-10-CM | POA: Diagnosis not present

## 2017-09-06 DIAGNOSIS — I130 Hypertensive heart and chronic kidney disease with heart failure and stage 1 through stage 4 chronic kidney disease, or unspecified chronic kidney disease: Secondary | ICD-10-CM | POA: Diagnosis not present

## 2017-09-06 DIAGNOSIS — I2510 Atherosclerotic heart disease of native coronary artery without angina pectoris: Secondary | ICD-10-CM | POA: Diagnosis not present

## 2017-09-06 DIAGNOSIS — E1122 Type 2 diabetes mellitus with diabetic chronic kidney disease: Secondary | ICD-10-CM | POA: Diagnosis not present

## 2017-09-06 DIAGNOSIS — J441 Chronic obstructive pulmonary disease with (acute) exacerbation: Secondary | ICD-10-CM | POA: Diagnosis not present

## 2017-09-15 ENCOUNTER — Ambulatory Visit (INDEPENDENT_AMBULATORY_CARE_PROVIDER_SITE_OTHER): Payer: Medicare Other | Admitting: *Deleted

## 2017-09-15 DIAGNOSIS — I482 Chronic atrial fibrillation, unspecified: Secondary | ICD-10-CM

## 2017-09-15 DIAGNOSIS — I4892 Unspecified atrial flutter: Secondary | ICD-10-CM | POA: Diagnosis not present

## 2017-09-15 DIAGNOSIS — Z5181 Encounter for therapeutic drug level monitoring: Secondary | ICD-10-CM

## 2017-09-15 DIAGNOSIS — Z7901 Long term (current) use of anticoagulants: Secondary | ICD-10-CM

## 2017-09-15 DIAGNOSIS — Z8679 Personal history of other diseases of the circulatory system: Secondary | ICD-10-CM | POA: Diagnosis not present

## 2017-09-15 LAB — POCT INR: INR: 2.7

## 2017-09-15 NOTE — Patient Instructions (Signed)
Description   Continue taking Coumadin 1 tablet (5mg ) daily except 1/2 tablet (2.5mg ) on Fridays while on the Prednisone.   Recheck INR 3 weeks.  Call with new medications or any changes [phone]. Be sure to keep intake of greens consistent

## 2017-09-18 DIAGNOSIS — I130 Hypertensive heart and chronic kidney disease with heart failure and stage 1 through stage 4 chronic kidney disease, or unspecified chronic kidney disease: Secondary | ICD-10-CM | POA: Diagnosis not present

## 2017-09-18 DIAGNOSIS — I2510 Atherosclerotic heart disease of native coronary artery without angina pectoris: Secondary | ICD-10-CM | POA: Diagnosis not present

## 2017-09-18 DIAGNOSIS — I5033 Acute on chronic diastolic (congestive) heart failure: Secondary | ICD-10-CM | POA: Diagnosis not present

## 2017-09-18 DIAGNOSIS — J441 Chronic obstructive pulmonary disease with (acute) exacerbation: Secondary | ICD-10-CM | POA: Diagnosis not present

## 2017-09-18 DIAGNOSIS — E1122 Type 2 diabetes mellitus with diabetic chronic kidney disease: Secondary | ICD-10-CM | POA: Diagnosis not present

## 2017-09-18 DIAGNOSIS — J9621 Acute and chronic respiratory failure with hypoxia: Secondary | ICD-10-CM | POA: Diagnosis not present

## 2017-09-20 ENCOUNTER — Other Ambulatory Visit: Payer: Self-pay | Admitting: Cardiology

## 2017-09-20 ENCOUNTER — Other Ambulatory Visit: Payer: Self-pay | Admitting: Emergency Medicine

## 2017-09-26 ENCOUNTER — Other Ambulatory Visit: Payer: Self-pay | Admitting: Emergency Medicine

## 2017-10-06 DIAGNOSIS — C61 Malignant neoplasm of prostate: Secondary | ICD-10-CM | POA: Diagnosis not present

## 2017-10-09 ENCOUNTER — Other Ambulatory Visit: Payer: Self-pay | Admitting: Urology

## 2017-10-09 DIAGNOSIS — C61 Malignant neoplasm of prostate: Secondary | ICD-10-CM

## 2017-10-16 ENCOUNTER — Ambulatory Visit (INDEPENDENT_AMBULATORY_CARE_PROVIDER_SITE_OTHER): Payer: Medicare Other | Admitting: Family Medicine

## 2017-10-16 ENCOUNTER — Encounter: Payer: Self-pay | Admitting: Family Medicine

## 2017-10-16 VITALS — BP 144/75 | HR 120 | Temp 97.6°F | Resp 16 | Ht 65.5 in | Wt 195.5 lb

## 2017-10-16 DIAGNOSIS — I10 Essential (primary) hypertension: Secondary | ICD-10-CM | POA: Diagnosis not present

## 2017-10-16 DIAGNOSIS — E119 Type 2 diabetes mellitus without complications: Secondary | ICD-10-CM

## 2017-10-16 DIAGNOSIS — I4891 Unspecified atrial fibrillation: Secondary | ICD-10-CM | POA: Diagnosis not present

## 2017-10-16 DIAGNOSIS — N183 Chronic kidney disease, stage 3 unspecified: Secondary | ICD-10-CM

## 2017-10-16 DIAGNOSIS — J9611 Chronic respiratory failure with hypoxia: Secondary | ICD-10-CM | POA: Diagnosis not present

## 2017-10-16 DIAGNOSIS — I5022 Chronic systolic (congestive) heart failure: Secondary | ICD-10-CM | POA: Diagnosis not present

## 2017-10-16 DIAGNOSIS — E7800 Pure hypercholesterolemia, unspecified: Secondary | ICD-10-CM | POA: Diagnosis not present

## 2017-10-16 DIAGNOSIS — J449 Chronic obstructive pulmonary disease, unspecified: Secondary | ICD-10-CM | POA: Diagnosis not present

## 2017-10-16 LAB — BASIC METABOLIC PANEL
BUN: 15 mg/dL (ref 6–23)
CALCIUM: 9 mg/dL (ref 8.4–10.5)
CO2: 29 mEq/L (ref 19–32)
Chloride: 103 mEq/L (ref 96–112)
Creatinine, Ser: 1.17 mg/dL (ref 0.40–1.50)
GFR: 63.78 mL/min (ref >60.00)
Glucose, Bld: 134 mg/dL — ABNORMAL HIGH (ref 70–99)
Potassium: 4 mEq/L (ref 3.5–5.1)
SODIUM: 140 meq/L (ref 135–145)

## 2017-10-16 LAB — LIPID PANEL
Cholesterol: 159 mg/dL (ref 0–200)
HDL: 46.5 mg/dL (ref >39.00)
NonHDL: 112.33
Total CHOL/HDL Ratio: 3
Triglycerides: 224 mg/dL — ABNORMAL HIGH (ref 0.0–149.0)
VLDL: 44.8 mg/dL — AB (ref 0.0–40.0)

## 2017-10-16 LAB — HEMOGLOBIN A1C: Hgb A1c MFr Bld: 7.5 % — ABNORMAL HIGH (ref 4.6–6.5)

## 2017-10-16 LAB — LDL CHOLESTEROL, DIRECT: Direct LDL: 88 mg/dL

## 2017-10-16 NOTE — Progress Notes (Signed)
OFFICE VISIT  10/16/2017   CC:  Chief Complaint  Patient presents with  . Follow-up    RCI, pt is fasting.     HPI:    Patient is a 82 y.o. Caucasian male who presents for 6 mo f/u HTN, DM 2, CRI stage II/III, HLD. Says he's gradually getting worse with breathing----only with walking.  At rest he is fine. He has f/u with his pulmonologist later this week. Of note, he did not take any of his dig, dilt, or lasix today or yesterday.  Too busy with social events yesterday.   PSA went up quite a bit at last urol f/u---MRI prostate planned this week.  HTN: occ bp check at home 130s/80s.    DM 2: occ home glucose check, pt doesn't recall the numbers. No burning, tingling, or numbness in feet.  CRI: he avoids NSAIDS.  Fluid balance : this is a balance for him due to CHF and CRI.  HLD: he did not do a trial off this med like we discussed last visit--decided not to b/c a trial in remote past did not make a difference.  ROS: no cough or fever.  Past Medical History:  Diagnosis Date  . Adrenal adenoma   . Atrial fibrillation (HCC)    Rate control + Coumadin  . Atrial flutter ([place])   . CAD (coronary artery disease)    Lexiscan Myoview (01/2014): Lateral soft tissue attenuation, no ischemia, not gated, low risk  . CAP (community acquired pneumonia)    Hospitalized 10/2014  . Chronic diastolic heart failure ([place])   . Chronic hypoxemic respiratory failure (HCC)    from COPD  . Chronic renal insufficiency, stage III (moderate) (HCC)    CrCl 50s  . COPD (chronic obstructive pulmonary disease) (HCC)    oxygen-dependent (2.5 L 24/7).  Improved on starting chronic prednisone 06/2017 (increased to 20 mg qd 08/2017)  . Diabetes mellitus without complication ([place]) 07/37/1062   A1c 6.5 % 05/2016  . Diverticulosis   . Dyslipidemia   . Dyspnea    Multifactorial: deconditioning, COPD, obesity hypoventilation syndrome.  . Embolic stroke ([place])    d/t endocarditis 2008  . Hemorrhoids   .  History of mitral valve replacement    needs SBE prophylaxis  . History of prostate cancer remote past   f/u by Dr. Claris Che; no recurrence as of f/u 10/2015.  PSA rising from 2013 to 06/2016: as of 02/2017 plan is bone scan (CT if PSA gets above 5).  Marland Kitchen HTN (hypertension)   . Mediastinal lymphadenopathy   . Pulmonary hypertension ([place]) 01/09/2015   secondary  . Right thyroid nodule   . Thrombocytopenia ([place])   . TIA (transient ischemic attack) 03/25/2011   ? of due to transient diplopia  . Ulcerative colitis    left-sided/segmental, associated with diverticulosis.  Sulfasalazine per GI (Dr. Carlean Purl).  . Warthin's tumor     Past Surgical History:  Procedure Laterality Date  . carotid duplex dopplers  04/2014   No signif plaques; repeat 2 yrs per cardiology  . CATARACT EXTRACTION, BILATERAL    . COLONOSCOPY W/ BIOPSIES  12/19/2007   left-sided colitis, diverticulosis, hemorrhoids  . FLEXIBLE SIGMOIDOSCOPY  01/11/2008; 03/19/2010   2009 and 2011:segmental left colitis, diverticulosis, hemorrhoids  . INGUINAL HERNIA REPAIR     right  . MITRAL VALVE REPAIR  09/22/04   and modified Cox-Maze IV   . MITRAL VALVE REPAIR  09/14/05   redo MV repair d/t endocardiits/embolic events  . PROSTATECTOMY  06/2003   Radical  . RIGHT HEART CATHETERIZATION N/A 01/01/2015   Procedure: RIGHT HEART CATH;  Surgeon: Peter M Martinique, MD;  Location: [facility] CATH LAB;  Service: Cardiovascular;  Laterality: N/A;  . SALIVARY GLAND SURGERY     left resection  . TRANSTHORACIC ECHOCARDIOGRAM  11/2013; 07/2017   2015: Mod LVH, EF 55-60%, no WM abnormalities, +LA dilation, +severe RV dilation and impaired systolic fxn, +increased pulm art pressures.  Valves ok.  2018- EF 60-65%, wall motion nl, s/p MV repair, mild RV syst dysf.    Outpatient Medications Prior to Visit  Medication Sig Dispense Refill  . albuterol (PROVENTIL HFA;VENTOLIN HFA) 108 (90 Base) MCG/ACT inhaler Inhale 1 puff into the lungs every 6 (six) hours as needed  for wheezing or shortness of breath (using 5-6 times every 24 hrs). 3 Inhaler 2  . budesonide (PULMICORT) 0.5 MG/2ML nebulizer solution USE ONE VIAL IN NEBULIZER TWICE DAILY 120 mL 5  . digoxin (LANOXIN) 0.125 MG tablet Take 1 tablet (0.125 mg total) by mouth daily. 90 tablet 3  . diltiazem (CARDIZEM CD) 240 MG 24 hr capsule Take 1 capsule (240 mg total) by mouth daily. 90 capsule 3  . furosemide (LASIX) 40 MG tablet Take 1 and 1/2 tab po qd 135 tablet 3  . LORazepam (ATIVAN) 0.5 MG tablet Take 1 tablet (0.5 mg total) by mouth 2 (two) times daily as needed for anxiety. 30 tablet 2  . olopatadine (PATANOL) 0.1 % ophthalmic solution Place 1 drop into both eyes 2 (two) times daily. 5 mL 12  . pravastatin (PRAVACHOL) 40 MG tablet Take 1 tablet (40 mg total) by mouth every evening. NEED OV. 90 tablet 0  . predniSONE (DELTASONE) 20 MG tablet Take 1 tablet (20 mg total) by mouth daily with breakfast. 60 tablet 0  . STIOLTO RESPIMAT 2.5-2.5 MCG/ACT AERS INHALE 2 PUFFS INTO THE LUNGS DAILY. 12 g 3  . warfarin (COUMADIN) 5 MG tablet TAKE  1/2  To 1 TABLET BY MOUTH DAILY AS DIRECTED BY COUMADIN CLINIC 90 tablet 1  . predniSONE (DELTASONE) 10 MG tablet Take total 30 mg daily starting 11/1, then 11/2 and 11/3 then resume 15 mg daily. (Patient not taking: Reported on 10/16/2017) 60 tablet 0   No facility-administered medications prior to visit.     No Known Allergies  ROS As per HPI  PE: Blood pressure (!) 144/75, pulse (!) 120, temperature 97.6 F (36.4 C), temperature source Oral, resp. rate 16, height 5' 5.5" (1.664 m), weight 195 lb 8 oz (88.7 kg), SpO2 93 %. Gen: Alert, well appearing.  Patient is oriented to person, place, time, and situation. AFFECT: pleasant, lucid thought and speech. CV: Regular, tachycardic to 120, no m/r. LUNGS: CTA bilat except some faint crackles in L base that sound like scarring sounds.  Exp phase not signif prolonged. Breathing at rest is nonlabored. EXT: no clubbing or  cyanosis.  1+ pitting edema on L LL.  2+ pitting on R LL.  LABS:    Chemistry      Component Value Date/Time   NA 139 08/14/2017 1116   K 3.9 08/14/2017 1116   CL 100 08/14/2017 1116   CO2 29 08/14/2017 1116   BUN 25 (H) 08/14/2017 1116   CREATININE 1.16 08/14/2017 1116      Component Value Date/Time   CALCIUM 9.3 08/14/2017 1116   ALKPHOS 56 07/19/2017 0745   AST 18 07/19/2017 0745   ALT 17 07/19/2017 0745   BILITOT 1.2 07/19/2017 0745  Lab Results  Component Value Date   WBC 11.2 (H) 07/19/2017   HGB 14.0 07/19/2017   HCT 41.4 07/19/2017   MCV 85.4 07/19/2017   PLT 173 07/19/2017   Lab Results  Component Value Date   CHOL 143 10/12/2016   HDL 44.20 10/12/2016   LDLCALC 79 10/12/2016   TRIG 100.0 10/12/2016   CHOLHDL 3 10/12/2016   Lab Results  Component Value Date   HGBA1C 6.2 04/17/2017   Lab Results  Component Value Date   INR 2.7 09/15/2017   INR 3.2 1May 04, 202018   INR 2.3 08/14/2017   PROTIME 18.5 03/10/2009   PROTIME 15.9 02/26/2009   PROTIME 14.2 02/20/2009     IMPRESSION AND PLAN:  1) DM 2: no home monitoring.  Eats w/out much regard to nutritional content. Overdue for eye exam: encouraged pt to get this sometime over the next 3 mo. Feet exam today is normal. HbA1c today. Cr/lytes today.  2) HTN: The current medical regimen is effective;  continue present plan and medications. Lytes/cr today.  3) CRI stage II/III:  Lytes/cr check today.  4) HLD: tolerating statin.  FLP today.  5) Chronic hypoxemia resp failure (chronic syst HF + COPD). F/u with pulm later this week. He's on chronic prednisone, stiolto, pulmicort, and albuterol, + 2 L oxygen 24/7.  6) A-fib: regular today but tachycardic: he has not taken his dilt, lasix, or dig in 2 days. He'll take these meds today and get back on schedule.  7) Chronic syst CHF: NYHA class III.  Off meds x 2d. Restart dig, lasix.  Restart dilt for help with rate control.  An After Visit Summary  was printed and given to the patient.  FOLLOW UP: Return in about 3 months (around 01/14/2018) for routine chronic illness f/u.  Signed:  Crissie Sickles, MD           10/16/2017

## 2017-10-16 NOTE — Patient Instructions (Addendum)
Great to see you.  Make an appointment with your eye doctor sometime over the next 3 months!

## 2017-10-17 ENCOUNTER — Encounter: Payer: Self-pay | Admitting: Family Medicine

## 2017-10-17 ENCOUNTER — Ambulatory Visit (INDEPENDENT_AMBULATORY_CARE_PROVIDER_SITE_OTHER): Payer: Medicare Other | Admitting: *Deleted

## 2017-10-17 DIAGNOSIS — I4891 Unspecified atrial fibrillation: Secondary | ICD-10-CM | POA: Diagnosis not present

## 2017-10-17 DIAGNOSIS — Z7901 Long term (current) use of anticoagulants: Secondary | ICD-10-CM

## 2017-10-17 DIAGNOSIS — Z8679 Personal history of other diseases of the circulatory system: Secondary | ICD-10-CM

## 2017-10-17 DIAGNOSIS — Z5181 Encounter for therapeutic drug level monitoring: Secondary | ICD-10-CM

## 2017-10-17 DIAGNOSIS — I4892 Unspecified atrial flutter: Secondary | ICD-10-CM

## 2017-10-17 LAB — POCT INR: INR: 2.5

## 2017-10-17 NOTE — Patient Instructions (Signed)
Description   Continue taking Coumadin 1 tablet (5mg ) daily except 1/2 tablet (2.5mg ) on Fridays while on the Prednisone.   Recheck INR 4 weeks.  Call with new medications or any changes [phone]. Be sure to keep intake of greens consistent

## 2017-10-19 ENCOUNTER — Encounter: Payer: Self-pay | Admitting: Emergency Medicine

## 2017-10-19 ENCOUNTER — Ambulatory Visit (INDEPENDENT_AMBULATORY_CARE_PROVIDER_SITE_OTHER): Payer: Medicare Other | Admitting: Emergency Medicine

## 2017-10-19 ENCOUNTER — Other Ambulatory Visit: Payer: Self-pay

## 2017-10-19 DIAGNOSIS — R0602 Shortness of breath: Secondary | ICD-10-CM | POA: Diagnosis not present

## 2017-10-19 DIAGNOSIS — J438 Other emphysema: Secondary | ICD-10-CM

## 2017-10-19 MED ORDER — GLIPIZIDE ER 2.5 MG PO TB24: 2.5000 mg | ORAL_TABLET | Freq: Every day | ORAL | 2 refills | Status: DC

## 2017-10-19 NOTE — Progress Notes (Signed)
Glipizide XL sent to pharmacy as directed.

## 2017-10-19 NOTE — Assessment & Plan Note (Signed)
Increased dyspnea over the last several months.  He appears to be taking his bronchodilators correctly.  He is on daily prednisone and I doubt increasing that dose will bring about any change.  Question whether his pulse flow oxygen is adequate, he may need to change to continuous.  He also notes that nasal obstruction may be a contributor to his dyspnea.  No evidence of an acute exacerbation  Walking Oximetry today on 3 L/min pulsed flow to see if this is adequate. Continue Stiolto 2 puffs once a day Keep albuterol available to use 2 puffs if needed for shortness of breath, wheezing, chest tightness Continue Pulmicort nebulizers twice a day Continue prednisone 20 mg once a day Please try starting loratadine 10 mg once a day. If you continue to have nasal congestion then you could consider starting Flonase nasal spray, 2 sprays each nostril once a day. Follow with Dr Lamonte Sakai in 3 months or sooner if you have any problems.

## 2017-10-19 NOTE — Patient Instructions (Addendum)
Walking Oximetry today on 3 L/min pulsed flow to see if this is adequate. Continue Stiolto 2 puffs once a day Keep albuterol available to use 2 puffs if needed for shortness of breath, wheezing, chest tightness Continue Pulmicort nebulizers twice a day Continue prednisone 20 mg once a day Please try starting loratadine 10 mg once a day. If you continue to have nasal congestion then you could consider starting Flonase nasal spray, 2 sprays each nostril once a day. Follow with Dr Lamonte Sakai in 3 months or sooner if you have any problems.

## 2017-10-19 NOTE — Progress Notes (Signed)
   Subjective:    Patient ID: [name], male    DOB: 04-04-1936, 82 y.o.   MRN: [id number]  HPI  ROV 10/19/17 --[name] is a 82 year old man with a history of tobacco use, COPD with moderate to severe obstruction, associated hypoxemic respiratory failure.  Also with a history of mitral valve repair, atrial fibrillation (status post Maze procedure), coronary artery disease, history of stroke, secondary pulmonary hypertension. He s on chronic prednisone, increased back to 20 mg daily at his last visit in November. Stiolto daily. Albuterol about 4-5 x a day. He feels that his breathing is progressively worse. His walking tolerance is worse. He feels better on continuous O2, better walking tolerance. He cleaned the filter on his inogen POC, also increased to 3L/min.   No flowsheet data found.  Review of Systems As per HPI     Objective:   Physical Exam Vitals:   10/19/17 0857 10/19/17 0858  BP:  136/72  Pulse:  99  SpO2:  94%  Weight: 195 lb (88.5 kg)   Height: 5\' 8"  (1.727 m)    Gen: Pleasant, elderly gentleman, in no distress,  normal affect on 3 L/m pulse flow  ENT: No lesions,  mouth clear,  oropharynx clear, no postnasal drip  Neck: No JVD, no stridor  Lungs: No use of accessory muscles, Distant but clear.  No wheezing, no crackles  Cardiovascular: distant, irregular, no murmur or gallops, no peripheral edema  Musculoskeletal: No deformities, no cyanosis or clubbing  Neuro: alert, non focal  Skin: Warm, bruising on both deltoids      Assessment & Plan:  COPD (chronic obstructive pulmonary disease) with emphysema Increased dyspnea over the last several months.  He appears to be taking his bronchodilators correctly.  He is on daily prednisone and I doubt increasing that dose will bring about any change.  Question whether his pulse flow oxygen is adequate, he may need to change to continuous.  He also notes that nasal obstruction may be a contributor to his dyspnea.   No evidence of an acute exacerbation  Walking Oximetry today on 3 L/min pulsed flow to see if this is adequate. Continue Stiolto 2 puffs once a day Keep albuterol available to use 2 puffs if needed for shortness of breath, wheezing, chest tightness Continue Pulmicort nebulizers twice a day Continue prednisone 20 mg once a day Please try starting loratadine 10 mg once a day. If you continue to have nasal congestion then you could consider starting Flonase nasal spray, 2 sprays each nostril once a day. Follow with Dr Lamonte Sakai in 3 months or sooner if you have any problems.  SOB (shortness of breath) Progressive dyspnea.  No evidence of wheezing.  No evidence of volume overload or crackles on exam.  Question whether his pulse flow oxygen is adequate.  Also may be a contributor of nasal obstruction.  We will try to treat this empirically.  Baltazar Apo, MD, PhD 10/19/2017, 9:23 AM [facility] [phone] or if no answer [phone]

## 2017-10-19 NOTE — Assessment & Plan Note (Signed)
Progressive dyspnea.  No evidence of wheezing.  No evidence of volume overload or crackles on exam.  Question whether his pulse flow oxygen is adequate.  Also may be a contributor of nasal obstruction.  We will try to treat this empirically.

## 2017-10-20 ENCOUNTER — Telehealth: Payer: Self-pay | Admitting: Emergency Medicine

## 2017-10-20 ENCOUNTER — Encounter (HOSPITAL_COMMUNITY)
Admission: RE | Admit: 2017-10-20 | Discharge: 2017-10-20 | Disposition: A | Discharge status: Home / Self Care | Payer: Medicare Other | Source: Ambulatory Visit | Attending: Urology | Admitting: Urology

## 2017-10-20 ENCOUNTER — Encounter (HOSPITAL_COMMUNITY): Payer: Self-pay

## 2017-10-20 DIAGNOSIS — C61 Malignant neoplasm of prostate: Secondary | ICD-10-CM | POA: Insufficient documentation

## 2017-10-20 HISTORY — DX: Malignant (primary) neoplasm, unspecified: C80.1

## 2017-10-20 MED ORDER — TECHNETIUM TC 99M MEDRONATE IV KIT
25.0000 | PACK | Freq: Once | INTRAVENOUS | Status: AC | PRN
  Administered 2017-10-20: 20.2 via INTRAVENOUS

## 2017-10-20 NOTE — Telephone Encounter (Signed)
Form will be addressed once RB returns to the office on 10/24/17.

## 2017-10-20 NOTE — Telephone Encounter (Signed)
Placed in [facility] look at folder.

## 2017-10-26 ENCOUNTER — Encounter: Payer: Self-pay | Admitting: Family Medicine

## 2017-10-26 NOTE — Telephone Encounter (Signed)
Form has not been returned to me. This is still in RB's possession.

## 2017-10-26 NOTE — Telephone Encounter (Signed)
LCL please advise if form has been returned to you.  Thanks!

## 2017-10-27 NOTE — Telephone Encounter (Signed)
Form was returned to me. Spoke with pt's wife. She is aware that this will be placed in the mail today. Nothing further was needed.

## 2017-10-30 ENCOUNTER — Encounter: Payer: Self-pay | Admitting: *Deleted

## 2017-10-30 ENCOUNTER — Emergency Department (HOSPITAL_COMMUNITY)
Admission: EM | Admit: 2017-10-30 | Discharge: 2017-10-30 | Disposition: A | Discharge status: Home / Self Care | Payer: Medicare Other | Attending: Emergency Medicine | Admitting: Emergency Medicine

## 2017-10-30 ENCOUNTER — Telehealth: Payer: Self-pay | Admitting: Family Medicine

## 2017-10-30 ENCOUNTER — Ambulatory Visit: Payer: Medicare Other | Admitting: Family Medicine

## 2017-10-30 ENCOUNTER — Other Ambulatory Visit: Payer: Self-pay

## 2017-10-30 ENCOUNTER — Emergency Department (HOSPITAL_COMMUNITY): Payer: Medicare Other

## 2017-10-30 DIAGNOSIS — I110 Hypertensive heart disease with heart failure: Secondary | ICD-10-CM | POA: Diagnosis not present

## 2017-10-30 DIAGNOSIS — Z87891 Personal history of nicotine dependence: Secondary | ICD-10-CM | POA: Diagnosis not present

## 2017-10-30 DIAGNOSIS — Z79899 Other long term (current) drug therapy: Secondary | ICD-10-CM | POA: Diagnosis not present

## 2017-10-30 DIAGNOSIS — I2510 Atherosclerotic heart disease of native coronary artery without angina pectoris: Secondary | ICD-10-CM | POA: Diagnosis not present

## 2017-10-30 DIAGNOSIS — Z8546 Personal history of malignant neoplasm of prostate: Secondary | ICD-10-CM | POA: Diagnosis not present

## 2017-10-30 DIAGNOSIS — I509 Heart failure, unspecified: Secondary | ICD-10-CM | POA: Insufficient documentation

## 2017-10-30 DIAGNOSIS — R0602 Shortness of breath: Secondary | ICD-10-CM | POA: Diagnosis not present

## 2017-10-30 DIAGNOSIS — J449 Chronic obstructive pulmonary disease, unspecified: Secondary | ICD-10-CM | POA: Insufficient documentation

## 2017-10-30 DIAGNOSIS — R0682 Tachypnea, not elsewhere classified: Secondary | ICD-10-CM | POA: Diagnosis not present

## 2017-10-30 LAB — BASIC METABOLIC PANEL
Anion gap: 12 (ref 5–15)
BUN: 9 mg/dL (ref 6–20)
CHLORIDE: 101 mmol/L (ref 101–111)
CO2: 26 mmol/L (ref 22–32)
CREATININE: 1.14 mg/dL (ref 0.61–1.24)
Calcium: 9.2 mg/dL (ref 8.9–10.3)
GFR calc Af Amer: >60 mL/min (ref >60)
GFR calc non Af Amer: 59 mL/min — ABNORMAL LOW (ref >60)
Glucose, Bld: 96 mg/dL (ref 65–99)
Potassium: 3.8 mmol/L (ref 3.5–5.1)
SODIUM: 139 mmol/L (ref 135–145)

## 2017-10-30 LAB — PROTIME-INR
INR: 2.19
Prothrombin Time: 24.2 seconds — ABNORMAL HIGH (ref 11.4–15.2)

## 2017-10-30 LAB — CBC
HCT: 42.7 % (ref 39.0–52.0)
Hemoglobin: 14 g/dL (ref 13.0–17.0)
MCH: 29.9 pg (ref 26.0–34.0)
MCHC: 32.8 g/dL (ref 30.0–36.0)
MCV: 91 fL (ref 78.0–100.0)
PLATELETS: 138 10*3/uL — AB (ref 150–400)
RBC: 4.69 MIL/uL (ref 4.22–5.81)
RDW: 14.7 % (ref 11.5–15.5)
WBC: 8.4 10*3/uL (ref 4.0–10.5)

## 2017-10-30 LAB — DIGOXIN LEVEL: DIGOXIN LVL: 0.6 ng/mL — AB (ref 0.8–2.0)

## 2017-10-30 LAB — I-STAT TROPONIN, ED: TROPONIN I, POC: 0.05 ng/mL (ref 0.00–0.08)

## 2017-10-30 LAB — BRAIN NATRIURETIC PEPTIDE: B Natriuretic Peptide: 91.4 pg/mL (ref 0.0–100.0)

## 2017-10-30 MED ORDER — ALBUTEROL SULFATE (2.5 MG/3ML) 0.083% IN NEBU
5.0000 mg | INHALATION_SOLUTION | Freq: Once | RESPIRATORY_TRACT | Status: AC
  Administered 2017-10-30: 5 mg via RESPIRATORY_TRACT
  Filled 2017-10-30: qty 6

## 2017-10-30 MED ORDER — FUROSEMIDE 10 MG/ML IJ SOLN
40.0000 mg | Freq: Once | INTRAMUSCULAR | Status: AC
  Administered 2017-10-30: 40 mg via INTRAVENOUS
  Filled 2017-10-30: qty 4

## 2017-10-30 MED ORDER — IPRATROPIUM BROMIDE 0.02 % IN SOLN
0.5000 mg | Freq: Once | RESPIRATORY_TRACT | Status: AC
  Administered 2017-10-30: 0.5 mg via RESPIRATORY_TRACT
  Filled 2017-10-30: qty 2.5

## 2017-10-30 MED ORDER — METHYLPREDNISOLONE SODIUM SUCC 125 MG IJ SOLR
125.0000 mg | Freq: Once | INTRAMUSCULAR | Status: AC
  Administered 2017-10-30: 125 mg via INTRAVENOUS
  Filled 2017-10-30: qty 2

## 2017-10-30 MED ORDER — SODIUM CHLORIDE 0.9 % IV SOLN
INTRAVENOUS | Status: DC
  Administered 2017-10-30: 16:00:00 via INTRAVENOUS

## 2017-10-30 NOTE — ED Triage Notes (Addendum)
Pt arrived from home via [facility], shortness of breath past 3 days. Takes lasix 40mg  day, skipped Fri and Sat, took regular dose Sun and 60mg  today, he states per written prescription. Denies chest pain, states has chest tightness when with breathing difficulty but denies at this time. 96 spO2 on  6L nasal canula +1 edema bilaterally, states this is unusal for him despite CHF.  Alert Ox4

## 2017-10-30 NOTE — Telephone Encounter (Signed)
Ankle edema. Did not want triage RN. Appointment made.

## 2017-10-30 NOTE — Telephone Encounter (Signed)
This encounter was created in error - please disregard.

## 2017-10-30 NOTE — Discharge Instructions (Addendum)
It was our pleasure to provide your ER care today - we hope that you feel better.  Continue your home oxygen use, prednisone, and regular uses of your nebulizer treatments.  Follow up with your doctor and/or lung specialist in the next few days - call office tomorrow AM to arrange for close follow up.   Return to ER if worse, new symptoms, increased trouble breathing, fevers, chest pain, other concern.

## 2017-10-30 NOTE — ED Notes (Signed)
Patient transported to X-ray 

## 2017-10-30 NOTE — ED Provider Notes (Addendum)
[facility] EMERGENCY DEPARTMENT Provider Note   CSN: [id number] Arrival date & time: 10/30/17  1417     History   Chief Complaint Chief Complaint  Patient presents with  . Shortness of Breath    HPI [name] is a 82 y.o. male.  Patient with hx copd and chf c/o progressive sob in the past week. Symptoms moderate/severe, persistent, worse today. Worse w exertion.  No cough, sore throat or uri symptoms. No fever. No chest pain.  + increased swelling to bil feet. Has increased home o2 from 2 to 3 liters today. Compliant w meds, except missed a couple days of taking lasix in past week.    The history is provided by the patient.  Shortness of Breath  Pertinent negatives include no fever, no headaches, no sore throat, no neck pain, no chest pain, no vomiting, no abdominal pain and no rash.    Past Medical History:  Diagnosis Date  . Adrenal adenoma   . Atrial fibrillation (HCC)    Rate control + Coumadin  . Atrial flutter ([place])   . CAD (coronary artery disease)    Lexiscan Myoview (01/2014): Lateral soft tissue attenuation, no ischemia, not gated, low risk  . Cancer ([place])   . CAP (community acquired pneumonia)    Hospitalized 10/2014  . Chronic diastolic heart failure ([place])   . Chronic hypoxemic respiratory failure (HCC)    from COPD  . Chronic renal insufficiency, stage III (moderate) (HCC)    CrCl 50s  . COPD (chronic obstructive pulmonary disease) (HCC)    oxygen-dependent (2.5 L 24/7).  Improved on starting chronic prednisone 06/2017 (increased to 20 mg qd 08/2017)  Progressive worsening dyspnea at pulm f/u 10/19/17--? changed to continuous 02 from pulsed delivery (?)--no change in med regimen.  . Diabetes mellitus without complication ([place]) 28/41/3244   A1c 6.5 % 05/2016  . Diverticulosis   . Dyslipidemia   . Dyspnea    Multifactorial: deconditioning, COPD, obesity hypoventilation syndrome.  . Embolic stroke ([place])    d/t endocarditis 2008  .  Hemorrhoids   . History of mitral valve replacement    needs SBE prophylaxis  . History of prostate cancer remote past   f/u by Dr. Claris Che; no recurrence as of f/u 10/2015.  PSA rising from 2013 to 06/2016: as of 09/2017 plan is bone scan and CT abd/pelv if PSA gets up around 10.  Marland Kitchen HTN (hypertension)   . Mediastinal lymphadenopathy   . Pulmonary hypertension ([place]) 01/09/2015   secondary  . Right thyroid nodule   . Thrombocytopenia ([place])   . TIA (transient ischemic attack) 03/25/2011   ? of due to transient diplopia  . Ulcerative colitis    left-sided/segmental, associated with diverticulosis.  Sulfasalazine per GI (Dr. Carlean Purl).  . Warthin's tumor     Patient Active Problem List   Diagnosis Date Noted  . Acute on chronic congestive heart failure ([place]) 07/18/2017  . Pruritus 09/02/2015  . Maxillary sinusitis 08/17/2015  . Eye irritation 08/17/2015  . Chronic hypoxemic respiratory failure ([place]) 07/17/2015  . Pulmonary hypertension ([place]) 01/09/2015  . Anxiety state 11/28/2014  . SOB (shortness of breath) 11/28/2014  . Cerebrovascular disease 11/20/2014  . Emotional lability 11/11/2014  . Tachycardia 11/04/2014  . Encounter for therapeutic drug monitoring 03/18/2014  . Atrial fibrillation ([place]) 07/25/2011  . Long term current use of anticoagulant 10/20/2010  . Essential hypertension, benign 08/20/2009  . HYPERLIPIDEMIA, FAMILIAL 08/03/2009  . Coronary atherosclerosis 08/03/2009  . MITRAL VALVE  REPLACEMENT, HX OF 08/03/2009  . THYROID NODULE, RIGHT 12/03/2008  . CEREBROVASCULAR ACCIDENT, HX OF 12/03/2008  . COPD (chronic obstructive pulmonary disease) with emphysema ([place]) 06/09/2008  . Segmental colitis associated with sigmoid diverticulosis 12/12/2007  . PROSTATE CANCER, HX OF 01/12/2007  . ENDOCARDITIS 07/04/2006    Class: History of    Past Surgical History:  Procedure Laterality Date  . carotid duplex dopplers  04/2014   No signif plaques; repeat 2 yrs per cardiology  .  CATARACT EXTRACTION, BILATERAL    . COLONOSCOPY W/ BIOPSIES  12/19/2007   left-sided colitis, diverticulosis, hemorrhoids  . FLEXIBLE SIGMOIDOSCOPY  01/11/2008; 03/19/2010   2009 and 2011:segmental left colitis, diverticulosis, hemorrhoids  . INGUINAL HERNIA REPAIR     right  . MITRAL VALVE REPAIR  09/22/04   and modified Cox-Maze IV   . MITRAL VALVE REPAIR  09/14/05   redo MV repair d/t endocardiits/embolic events  . PROSTATECTOMY  06/2003   Radical  . RIGHT HEART CATHETERIZATION N/A 01/01/2015   Procedure: RIGHT HEART CATH;  Surgeon: Peter M Martinique, MD;  Location: [facility] C[facility] LAB;  Service: Cardiovascular;  Laterality: N/A;  . SALIVARY GLAND SURGERY     left resection  . TRANSTHORACIC ECHOCARDIOGRAM  11/2013; 07/2017   2015: Mod LVH, EF 55-60%, no WM abnormalities, +LA dilation, +severe RV dilation and impaired systolic fxn, +increased pulm art pressures.  Valves ok.  2018- EF 60-65%, wall motion nl, s/p MV repair, mild RV syst dysf.       Home Medications    Prior to Admission medications   Medication Sig Start Date End Date Taking? Authorizing Provider  albuterol (PROVENTIL HFA;VENTOLIN HFA) 108 (90 Base) MCG/ACT inhaler Inhale 1 puff into the lungs every 6 (six) hours as needed for wheezing or shortness of breath (using 5-6 times every 24 hrs). 07/19/16   Collene Gobble, MD  budesonide (PULMICORT) 0.5 MG/2ML nebulizer solution USE ONE VIAL IN NEBULIZER TWICE DAILY 09/27/17   Collene Gobble, MD  digoxin (LANOXIN) 0.125 MG tablet Take 1 tablet (0.125 mg total) by mouth daily. 12/13/16   Lelon Perla, MD  diltiazem (CARDIZEM CD) 240 MG 24 hr capsule Take 1 capsule (240 mg total) by mouth daily. 12/13/16   Lelon Perla, MD  furosemide (LASIX) 40 MG tablet Take 1 and 1/2 tab po qd 08/14/17   McGowen, Adrian Blackwater, MD  glipiZIDE (GLIPIZIDE XL) 2.5 MG 24 hr tablet Take 1 tablet (2.5 mg total) by mouth daily with breakfast. 10/19/17   McGowen, Adrian Blackwater, MD  LORazepam (ATIVAN) 0.5 MG tablet Take  1 tablet (0.5 mg total) by mouth 2 (two) times daily as needed for anxiety. 08/14/17   McGowen, Adrian Blackwater, MD  olopatadine (PATANOL) 0.1 % ophthalmic solution Place 1 drop into both eyes 2 (two) times daily. 08/17/15   Kuneff, Renee A, DO  pravastatin (PRAVACHOL) 40 MG tablet Take 1 tablet (40 mg total) by mouth every evening. NEED OV. 09/21/17   Lelon Perla, MD  predniSONE (DELTASONE) 20 MG tablet Take 1 tablet (20 mg total) by mouth daily with breakfast. 08/18/17   Collene Gobble, MD  STIOLTO RESPIMAT 2.5-2.5 MCG/ACT AERS INHALE 2 PUFFS INTO THE LUNGS DAILY. 09/20/17   Collene Gobble, MD  warfarin (COUMADIN) 5 MG tablet TAKE  1/2  To 1 TABLET BY MOUTH DAILY AS DIRECTED BY COUMADIN CLINIC 09/21/17   Lelon Perla, MD    Family History Family History  Problem Relation Age of Onset  .  Cancer Mother        spinal  . Stroke Father   . Heart disease Father   . Colon cancer Neg Hx   . Heart attack Neg Hx     Social History Social History   Tobacco Use  . Smoking status: Former Smoker    Packs/day: 3.00    Years: 60.00    Pack years: 180.00    Types: Cigarettes    Last attempt to quit: 09/19/2002    Years since quitting: 15.1  . Smokeless tobacco: Never Used  Substance Use Topics  . Alcohol use: No    Alcohol/week: 0.0 oz  . Drug use: No     Allergies   Patient has no known allergies.   Review of Systems Review of Systems  Constitutional: Negative for fever.  HENT: Negative for sore throat.   Eyes: Negative for redness.  Respiratory: Positive for shortness of breath.   Cardiovascular: Negative for chest pain.  Gastrointestinal: Negative for abdominal pain and vomiting.  Genitourinary: Negative for flank pain.  Musculoskeletal: Negative for back pain and neck pain.  Skin: Negative for rash.  Neurological: Negative for headaches.  Hematological: Does not bruise/bleed easily.  Psychiatric/Behavioral: Negative for confusion.     Physical Exam Updated Vital  Signs Ht 1.727 m (5\' 8" )   Wt 88.5 kg (195 lb)   SpO2 96%   BMI 29.65 kg/m   Physical Exam  Constitutional: He appears well-developed and well-nourished.  Non-toxic appearance.  Sob appearing.   HENT:  Mouth/Throat: Oropharynx is clear and moist.  Eyes: Conjunctivae are normal.  Neck: Neck supple. No tracheal deviation present.  Cardiovascular: Normal rate, regular rhythm, normal heart sounds and intact distal pulses.  Pulmonary/Chest: No accessory muscle usage. He is in respiratory distress.  +decreased air exchange bil.  Wheezing.   Abdominal: Soft. Bowel sounds are normal. He exhibits no distension. There is no tenderness.  Genitourinary:  Genitourinary Comments: No cva tenderness  Musculoskeletal: He exhibits no tenderness.  Mild bil leg edema.   Neurological: He is alert.  Skin: Skin is warm and dry. No rash noted.  Psychiatric: He has a normal mood and affect.  Nursing note and vitals reviewed.    ED Treatments / Results  Labs (all labs ordered are listed, but only abnormal results are displayed) Results for orders placed or performed during the hospital encounter of 10/30/17  CBC  Result Value Ref Range   WBC 8.4 4.0 - 10.5 K/uL   RBC 4.69 4.22 - 5.81 MIL/uL   Hemoglobin 14.0 13.0 - 17.0 g/dL   HCT 42.7 39.0 - 52.0 %   MCV 91.0 78.0 - 100.0 fL   MCH 29.9 26.0 - 34.0 pg   MCHC 32.8 30.0 - 36.0 g/dL   RDW 14.7 11.5 - 15.5 %   Platelets 138 (L) 150 - 400 K/uL  Basic metabolic panel  Result Value Ref Range   Sodium 139 135 - 145 mmol/L   Potassium 3.8 3.5 - 5.1 mmol/L   Chloride 101 101 - 111 mmol/L   CO2 26 22 - 32 mmol/L   Glucose, Bld 96 65 - 99 mg/dL   BUN 9 6 - 20 mg/dL   Creatinine, Ser 1.14 0.61 - 1.24 mg/dL   Calcium 9.2 8.9 - 10.3 mg/dL   GFR calc non Af Amer 59 (L) >60 mL/min   GFR calc Af Amer >60 >60 mL/min   Anion gap 12 5 - 15  Protime-INR  Result Value Ref Range  Prothrombin Time 24.2 (H) 11.4 - 15.2 seconds   INR 2.19   Digoxin level   Result Value Ref Range   Digoxin Level 0.6 (L) 0.8 - 2.0 ng/mL  Brain natriuretic peptide  Result Value Ref Range   B Natriuretic Peptide 91.4 0.0 - 100.0 pg/mL  I-stat troponin, ED  Result Value Ref Range   Troponin i, poc 0.05 0.00 - 0.08 ng/mL   Comment 3           Dg Chest 2 View  Result Date: 10/30/2017 CLINICAL DATA:  82 year old male with a history shortness of breath EXAM: CHEST  2 VIEW COMPARISON:  08/07/2017, 07/19/2017 FINDINGS: Cardiomediastinal silhouette unchanged in size and contour. Surgical changes of median sternotomy and mitral valve repair. Similar appearance of coarsened interstitial markings and interlobular septal thickening. No confluent airspace disease. No pleural effusion. No pneumothorax. No displaced fracture. IMPRESSION: Similar appearance of the chest x-ray with coarsened interstitial markings and interlobular septal thickening. Findings may be chronic, however, early edema not excluded. No lobar pneumonia or pleural effusion. Surgical changes of median sternotomy and mitral valve repair. Electronically Signed   By: Corrie Mckusick D.O.   On: 10/30/2017 15:56   Nm Bone Scan Whole Body  Result Date: 10/20/2017 CLINICAL DATA:  Prostate cancer, elevated PSA EXAM: NUCLEAR MEDICINE WHOLE BODY BONE SCAN TECHNIQUE: Whole body anterior and posterior images were obtained approximately 3 hours after intravenous injection of radiopharmaceutical. RADIOPHARMACEUTICALS:  20.2 mCi Technetium-18m MDP IV COMPARISON:  None Correlation: CT abdomen and pelvis 10/20/2017 FINDINGS: Minimal uptake at the shoulders and LEFT wrist, typically degenerative. Focus of intense increased tracer at the anteromedial RIGHT knee question urinary contamination versus less likely related to degenerative changes. No worrisome sites of abnormal osseous tracer accumulation are identified which are concerning for metastatic prostate cancer to bone. Expected urinary tract and soft tissue distribution of tracer.  IMPRESSION: No scintigraphic evidence of a metastatic prostate cancer to bone. Electronically Signed   By: Lavonia Dana M.D.   On: 10/20/2017 15:27    EKG  EKG Interpretation  Date/Time:  Monday October 30 2017 14:22:56 EST Ventricular Rate:  89 PR Interval:    QRS Duration: 113 QT Interval:  352 QTC Calculation: 429 R Axis:   -163 Text Interpretation:  Atrial fibrillation Ventricular premature complex Nonspecific ST abnormality Confirmed by Lajean Saver [phone]) on 10/30/2017 3:35:25 PM       Radiology No results found.  Procedures Procedures (including critical care time)  Medications Ordered in ED Medications  albuterol (PROVENTIL) (2.5 MG/3ML) 0.083% nebulizer solution 5 mg (not administered)  0.9 %  sodium chloride infusion (not administered)     Initial Impression / Assessment and Plan / ED Course  I have reviewed the triage vital signs and the nursing notes.  Pertinent labs & imaging results that were available during my care of the patient were reviewed by me and considered in my medical decision making (see chart for details).  Iv ns. Continuous pulse ox and monitor.   Reviewed nursing notes and prior charts for additional history.   Labs sent. Cxr. Ecg.  ?vascular congestion on cxr.  Lasix dose iv.   Pt w poor air movement bilaterally. Albuterol and atrovent neb.    Persistent dyspnea/poor air movement/mild wheezing - it appears on review prior charts with progressive resp failure, increased o2 requirement, and steroid dep.  Pulmonary was not recommending change in meds/new meds on recent office visit.  Recheck, no wheezing. o2 sats 97% on his normal  o2.   Discussed treatment options, including recommending admission to hospital.  Pt indicates he feels he is breathing at baseline, and requests d/c to home.    Final Clinical Impressions(s) / ED Diagnoses   Final diagnoses:  None    ED Discharge Orders    None         Lajean Saver,  MD 10/30/17 [phone]

## 2017-11-07 ENCOUNTER — Ambulatory Visit: Payer: Medicare Other | Admitting: Family Medicine

## 2017-11-08 ENCOUNTER — Ambulatory Visit (INDEPENDENT_AMBULATORY_CARE_PROVIDER_SITE_OTHER): Payer: Medicare Other | Admitting: Family Medicine

## 2017-11-08 ENCOUNTER — Ambulatory Visit: Payer: Medicare Other | Admitting: Family Medicine

## 2017-11-08 ENCOUNTER — Encounter: Payer: Self-pay | Admitting: Family Medicine

## 2017-11-08 VITALS — BP 137/61 | HR 82 | Temp 97.3°F | Resp 18 | Wt 194.0 lb

## 2017-11-08 DIAGNOSIS — J9611 Chronic respiratory failure with hypoxia: Secondary | ICD-10-CM | POA: Diagnosis not present

## 2017-11-08 DIAGNOSIS — M7989 Other specified soft tissue disorders: Secondary | ICD-10-CM

## 2017-11-08 NOTE — Progress Notes (Signed)
OFFICE VISIT  11/12/2017   CC:  Chief Complaint  Patient presents with  . Foot Swelling    Right foot, cough   HPI:    Patient is a 82 y.o. Caucasian male with chronic hypoxemic resp failure (chronic diast CHF with severe COPD/pulm htn) who presents accompanied by his wife for mild intermittent focal swelling and discomfort at area at end of 1st/2nd metatarsals R foot for 1 week. The swelling is focal, not assoc with any recent trauma. He missed his lasix 2 days in a row recently. He went to ED 10/30/17 for increased dyspnea, was treated for COPD exacerbation/CHF--  was given a dose of lasix IV 40mg . He has felt back to baseline from resp status since that time.  No fevers.  Using 2-3 L oxygen as per his normal.  CXR 10/30/17: IMPRESSION: Similar appearance of the chest x-ray with coarsened interstitial markings and interlobular septal thickening. Findings may be chronic, however, early edema not excluded. No lobar pneumonia or pleural effusion. Surgical changes of median sternotomy and mitral valve repair.  He has a-fib: on rate control and coumadin (managed by coumadin clinic).  Past Medical History:  Diagnosis Date  . Adrenal adenoma   . Atrial fibrillation (HCC)    Rate control + Coumadin  . Atrial flutter ([place])   . CAD (coronary artery disease)    Lexiscan Myoview (01/2014): Lateral soft tissue attenuation, no ischemia, not gated, low risk  . Cancer ([place])   . CAP (community acquired pneumonia)    Hospitalized 10/2014  . Chronic diastolic heart failure ([place])   . Chronic hypoxemic respiratory failure (HCC)    from COPD  . Chronic renal insufficiency, stage III (moderate) (HCC)    CrCl 50s  . COPD (chronic obstructive pulmonary disease) (HCC)    oxygen-dependent (2.5 L 24/7).  Improved on starting chronic prednisone 06/2017 (increased to 20 mg qd 08/2017)  Progressive worsening dyspnea at pulm f/u 10/19/17--? changed to continuous 02 from pulsed delivery (?)--no change in  med regimen.  . Diabetes mellitus without complication ([place]) 93/71/6967   A1c 6.5 % 05/2016  . Diverticulosis   . Dyslipidemia   . Dyspnea    Multifactorial: deconditioning, COPD, obesity hypoventilation syndrome.  . Embolic stroke ([place])    d/t endocarditis 2008  . Hemorrhoids   . History of mitral valve replacement    needs SBE prophylaxis  . History of prostate cancer remote past   f/u by Dr. Claris Che; no recurrence as of f/u 10/2015.  PSA rising from 2013 to 06/2016: as of 09/2017 plan is bone scan and CT abd/pelv if PSA gets up around 10.  Marland Kitchen HTN (hypertension)   . Mediastinal lymphadenopathy   . Pulmonary hypertension ([place]) 01/09/2015   secondary  . Right thyroid nodule   . Thrombocytopenia ([place])   . TIA (transient ischemic attack) 03/25/2011   ? of due to transient diplopia  . Ulcerative colitis    left-sided/segmental, associated with diverticulosis.  Sulfasalazine per GI (Dr. Carlean Purl).  . Warthin's tumor     Past Surgical History:  Procedure Laterality Date  . carotid duplex dopplers  04/2014   No signif plaques; repeat 2 yrs per cardiology  . CATARACT EXTRACTION, BILATERAL    . COLONOSCOPY W/ BIOPSIES  12/19/2007   left-sided colitis, diverticulosis, hemorrhoids  . FLEXIBLE SIGMOIDOSCOPY  01/11/2008; 03/19/2010   2009 and 2011:segmental left colitis, diverticulosis, hemorrhoids  . INGUINAL HERNIA REPAIR     right  . MITRAL VALVE REPAIR  09/22/04  and modified Cox-Maze IV   . MITRAL VALVE REPAIR  09/14/05   redo MV repair d/t endocardiits/embolic events  . PROSTATECTOMY  06/2003   Radical  . RIGHT HEART CATHETERIZATION N/A 01/01/2015   Procedure: RIGHT HEART CATH;  Surgeon: Peter M Martinique, MD;  Location: [facility] C[facility] LAB;  Service: Cardiovascular;  Laterality: N/A;  . SALIVARY GLAND SURGERY     left resection  . TRANSTHORACIC ECHOCARDIOGRAM  11/2013; 07/2017   2015: Mod LVH, EF 55-60%, no WM abnormalities, +LA dilation, +severe RV dilation and impaired systolic fxn, +increased  pulm art pressures.  Valves ok.  2018- EF 60-65%, wall motion nl, s/p MV repair, mild RV syst dysf.    Outpatient Medications Prior to Visit  Medication Sig Dispense Refill  . albuterol (PROVENTIL HFA;VENTOLIN HFA) 108 (90 Base) MCG/ACT inhaler Inhale 1 puff into the lungs every 6 (six) hours as needed for wheezing or shortness of breath (using 5-6 times every 24 hrs). 3 Inhaler 2  . budesonide (PULMICORT) 0.5 MG/2ML nebulizer solution USE ONE VIAL IN NEBULIZER TWICE DAILY 120 mL 5  . digoxin (LANOXIN) 0.125 MG tablet Take 1 tablet (0.125 mg total) by mouth daily. 90 tablet 3  . diltiazem (CARDIZEM CD) 240 MG 24 hr capsule Take 1 capsule (240 mg total) by mouth daily. 90 capsule 3  . furosemide (LASIX) 40 MG tablet Take 1 and 1/2 tab po qd (Patient taking differently: Take 40 mg by mouth daily. Take 1 and 1/2 tab po qd) 135 tablet 3  . glipiZIDE (GLIPIZIDE XL) 2.5 MG 24 hr tablet Take 1 tablet (2.5 mg total) by mouth daily with breakfast. 30 tablet 2  . LORazepam (ATIVAN) 0.5 MG tablet Take 1 tablet (0.5 mg total) by mouth 2 (two) times daily as needed for anxiety. 30 tablet 2  . olopatadine (PATANOL) 0.1 % ophthalmic solution Place 1 drop into both eyes 2 (two) times daily. 5 mL 12  . pravastatin (PRAVACHOL) 40 MG tablet Take 1 tablet (40 mg total) by mouth every evening. NEED OV. 90 tablet 0  . predniSONE (DELTASONE) 20 MG tablet Take 1 tablet (20 mg total) by mouth daily with breakfast. 60 tablet 0  . STIOLTO RESPIMAT 2.5-2.5 MCG/ACT AERS INHALE 2 PUFFS INTO THE LUNGS DAILY. 12 g 3  . warfarin (COUMADIN) 5 MG tablet TAKE  1/2  To 1 TABLET BY MOUTH DAILY AS DIRECTED BY COUMADIN CLINIC (Patient taking differently: Take 2.5-5 mg by mouth See admin instructions. 5mg  daily except Friday take 2.5mg  AS DIRECTED BY COUMADIN CLINIC) 90 tablet 1   No facility-administered medications prior to visit.     No Known Allergies  ROS As per HPI  PE: Blood pressure 137/61, pulse 82, temperature (!) 97.3  F (36.3 C), temperature source Oral, resp. rate 18, weight 194 lb (88 kg), SpO2 90 %.2L oxygen Gen: alert, NAD. CV: Irreg irreg, rate 70s, Lungs:  very soft, early insp bibasilar crackles.  Aeration is good, no prolongation of exp phase.  Breathing nonlabored. EXT; no pitting edema. He has mild discomfort to palpation (but no swelling or erythema) at distal aspect right foot 1st and 2nd metatarsals and in web space between these.  Toes ROM intact.  No deformity.  No nodule palpable. LABS:    Chemistry      Component Value Date/Time   NA 139 10/30/2017 1457   K 3.8 10/30/2017 1457   CL 101 10/30/2017 1457   CO2 26 10/30/2017 1457   BUN 9 10/30/2017 1457  CREATININE 1.14 10/30/2017 1457      Component Value Date/Time   CALCIUM 9.2 10/30/2017 1457   ALKPHOS 56 07/19/2017 0745   AST 18 07/19/2017 0745   ALT 17 07/19/2017 0745   BILITOT 1.2 07/19/2017 0745     Lab Results  Component Value Date   WBC 8.4 10/30/2017   HGB 14.0 10/30/2017   HCT 42.7 10/30/2017   MCV 91.0 10/30/2017   PLT 138 (L) 10/30/2017   Lab Results  Component Value Date   INR 2.19 10/30/2017   INR 2.5 10/17/2017   INR 2.7 09/15/2017   PROTIME 18.5 03/10/2009   PROTIME 15.9 02/26/2009   PROTIME 14.2 02/20/2009   BNP    Component Value Date/Time   BNP 91.4 10/30/2017 1457    ProBNP    Component Value Date/Time   PROBNP 48.0 12/09/2014 0924   Lab Results  Component Value Date   DIGOXIN 0.6 (L) 10/30/2017    IMPRESSION AND PLAN:  1) Acute R foot pain: metatarsalgia/morton neuroma vs osteoarthritis. Reassured pt.  Encouraged him to try OTC metatarsal pad insert. No NSAIDS b/c on chronic anticoagulant tx.  2) Chronic hypoxic resp failure due to COPD and chronic CHF. He is stable at this time (s/p brief exacerbation that was likely due to two days of missed lasix dosing.  An After Visit Summary was printed and given to the patient.   FOLLOW UP: Return for keep appt already scheduled for  01/15/18.  Signed:  Crissie Sickles, MD           11/12/2017

## 2017-11-13 ENCOUNTER — Ambulatory Visit (INDEPENDENT_AMBULATORY_CARE_PROVIDER_SITE_OTHER): Payer: Medicare Other | Admitting: Pharmacist

## 2017-11-13 DIAGNOSIS — I4891 Unspecified atrial fibrillation: Secondary | ICD-10-CM

## 2017-11-13 DIAGNOSIS — Z7901 Long term (current) use of anticoagulants: Secondary | ICD-10-CM

## 2017-11-13 DIAGNOSIS — Z8679 Personal history of other diseases of the circulatory system: Secondary | ICD-10-CM | POA: Diagnosis not present

## 2017-11-13 DIAGNOSIS — Z5181 Encounter for therapeutic drug level monitoring: Secondary | ICD-10-CM

## 2017-11-13 DIAGNOSIS — I4892 Unspecified atrial flutter: Secondary | ICD-10-CM | POA: Diagnosis not present

## 2017-11-13 LAB — POCT INR: INR: 3.5

## 2017-11-13 NOTE — Patient Instructions (Signed)
Hold dose today, then continue taking Coumadin 1 tablet (5mg ) daily except 1/2 tablet (2.5mg ) on Fridays while on the Prednisone.   Recheck INR 4 weeks.  Call with new medications or any changes [phone]. Be sure to keep intake of greens consistent

## 2017-11-27 ENCOUNTER — Other Ambulatory Visit: Payer: Self-pay | Admitting: Cardiology

## 2017-11-28 NOTE — Telephone Encounter (Signed)
Rx(s) sent to pharmacy electronically.  

## 2017-12-11 ENCOUNTER — Ambulatory Visit (INDEPENDENT_AMBULATORY_CARE_PROVIDER_SITE_OTHER): Payer: Medicare Other | Admitting: *Deleted

## 2017-12-11 DIAGNOSIS — I4891 Unspecified atrial fibrillation: Secondary | ICD-10-CM

## 2017-12-11 DIAGNOSIS — Z5181 Encounter for therapeutic drug level monitoring: Secondary | ICD-10-CM | POA: Diagnosis not present

## 2017-12-11 DIAGNOSIS — Z8679 Personal history of other diseases of the circulatory system: Secondary | ICD-10-CM

## 2017-12-11 DIAGNOSIS — Z7901 Long term (current) use of anticoagulants: Secondary | ICD-10-CM | POA: Diagnosis not present

## 2017-12-11 DIAGNOSIS — I4892 Unspecified atrial flutter: Secondary | ICD-10-CM

## 2017-12-11 LAB — POCT INR: INR: 3.2

## 2017-12-11 NOTE — Patient Instructions (Signed)
Description   Start taking Coumadin 1 tablet (5mg ) daily except 1/2 tablet (2.5mg ) on Mondays and Fridays while on the Prednisone. Recheck INR 3 weeks.  Call with new medications or any changes [phone]. Be sure to keep intake of greens consistent

## 2017-12-21 ENCOUNTER — Other Ambulatory Visit: Payer: Self-pay

## 2017-12-21 MED ORDER — GLIPIZIDE ER 2.5 MG PO TB24: 2.5000 mg | ORAL_TABLET | Freq: Every day | ORAL | 1 refills | Status: DC

## 2017-12-27 NOTE — Progress Notes (Signed)
HPI: FU MV repair, AFib/flutter, s/p MAZE procedure, nonobstructive CAD, prior embolic CVA after initial MV repair secondary to endocarditis requiring redo surgery (repair), diastolic CHF, COPD, HL, HTN.Holter monitor June 2016 showed atrial fibrillation rate controlled. Right heart catheterization April 2016 showed a right atrial pressure of 8, PA pressure of 59/23 and pulmonary Wedge pressure of 18. Findings consistent with moderate pulmonary hypertension and upper normal left ventricular filling pressures. Carotid Dopplers October 2017 showed 1-39 bilateral stenosis.   Last echocardiogram October 2018 showed normal LV function, prior mitral valve repair with mean gradient 14 mmHg and mild mitral regurgitation, mild right ventricular enlargement.  Followed by pulmonary because of COPD.  Since last seen, he has significant dyspnea on exertion.  No orthopnea, PND, palpitations, syncope, chest pain or bleeding.  Occasional mild pedal edema.   Current Outpatient Medications  Medication Sig Dispense Refill  . albuterol (PROVENTIL HFA;VENTOLIN HFA) 108 (90 Base) MCG/ACT inhaler INHALE ONE PUFF BY MOUTH EVERY 6 HOURS AS NEEDED FOR WHEEZING AND FOR SHORTNESS OF BREATH (USING 5-6 TIMES EVERY 24 HOURS) 1 Inhaler 5  . albuterol (PROVENTIL HFA;VENTOLIN HFA) 108 (90 Base) MCG/ACT inhaler INHALE ONE PUFF BY MOUTH EVERY 6 HOURS AS NEEDED FOR WHEEZING AND FOR SHORTNESS OF BREATH (USING 5-6 TIMES EVERY 24 HOURS) 3 each 1  . albuterol (PROVENTIL) (2.5 MG/3ML) 0.083% nebulizer solution Take 3 mLs (2.5 mg total) by nebulization every 4 (four) hours. And as needed 300 mL 5  . budesonide (PULMICORT) 0.5 MG/2ML nebulizer solution USE ONE VIAL IN NEBULIZER TWICE DAILY 120 mL 5  . digoxin (LANOXIN) 0.125 MG tablet Take 1 tablet (0.125 mg total) by mouth daily. 90 tablet 3  . diltiazem (CARDIZEM CD) 240 MG 24 hr capsule Take 1 capsule (240 mg total) by mouth daily. 90 capsule 3  . furosemide (LASIX) 40 MG tablet Take  1 and 1/2 tab po qd (Patient taking differently: Take 40 mg by mouth daily. Take 1 and 1/2 tab po qd) 135 tablet 3  . glipiZIDE (GLIPIZIDE XL) 2.5 MG 24 hr tablet Take 1 tablet (2.5 mg total) by mouth daily with breakfast. 90 tablet 1  . LORazepam (ATIVAN) 0.5 MG tablet Take 1 tablet (0.5 mg total) by mouth 2 (two) times daily as needed for anxiety. 30 tablet 2  . olopatadine (PATANOL) 0.1 % ophthalmic solution Place 1 drop into both eyes 2 (two) times daily. 5 mL 12  . pravastatin (PRAVACHOL) 40 MG tablet TAKE 1 TABLET EVERY EVENING (NEED MD APPOINTMENT) 90 tablet 2  . predniSONE (DELTASONE) 20 MG tablet Take 1 tablet (20 mg total) by mouth daily with breakfast. 60 tablet 0  . STIOLTO RESPIMAT 2.5-2.5 MCG/ACT AERS INHALE 2 PUFFS INTO THE LUNGS DAILY. 12 g 3  . warfarin (COUMADIN) 5 MG tablet TAKE  1/2  To 1 TABLET BY MOUTH DAILY AS DIRECTED BY COUMADIN CLINIC (Patient taking differently: Take 2.5-5 mg by mouth See admin instructions. 5mg  daily except Friday take 2.5mg  AS DIRECTED BY COUMADIN CLINIC) 90 tablet 1   No current facility-administered medications for this visit.      Past Medical History:  Diagnosis Date  . Adrenal adenoma   . Atrial fibrillation (HCC)    Rate control + Coumadin  . Atrial flutter ([place])   . CAD (coronary artery disease)    Lexiscan Myoview (01/2014): Lateral soft tissue attenuation, no ischemia, not gated, low risk  . Cancer ([place])   . CAP (community acquired pneumonia)  Hospitalized 10/2014  . Chronic diastolic heart failure ([place])   . Chronic hypoxemic respiratory failure (HCC)    from COPD  . Chronic renal insufficiency, stage III (moderate) (HCC)    CrCl 50s  . COPD (chronic obstructive pulmonary disease) (HCC)    oxygen-dependent (2.5 L 24/7).  Improved on starting chronic prednisone 06/2017 (increased to 20 mg qd 08/2017)  Progressive worsening dyspnea at pulm f/u 10/19/17--? changed to continuous 02 from pulsed delivery (?)--no change in med regimen.  .  Diabetes mellitus without complication ([place]) 40/04/6760   A1c 6.5 % 05/2016  . Diverticulosis   . Dyslipidemia   . Dyspnea    Multifactorial: deconditioning, COPD, obesity hypoventilation syndrome.  . Embolic stroke ([place])    d/t endocarditis 2008  . Hemorrhoids   . History of mitral valve replacement    needs SBE prophylaxis  . History of prostate cancer remote past   f/u by Dr. Claris Che; no recurrence as of f/u 10/2015.  PSA rising from 2013 to 06/2016: as of 09/2017 plan is bone scan and CT abd/pelv if PSA gets up around 10.  Marland Kitchen HTN (hypertension)   . Mediastinal lymphadenopathy   . Pulmonary hypertension ([place]) 01/09/2015   secondary  . Right thyroid nodule   . Thrombocytopenia ([place])   . TIA (transient ischemic attack) 03/25/2011   ? of due to transient diplopia  . Ulcerative colitis    left-sided/segmental, associated with diverticulosis.  Sulfasalazine per GI (Dr. Carlean Purl).  . Warthin's tumor     Past Surgical History:  Procedure Laterality Date  . carotid duplex dopplers  04/2014   No signif plaques; repeat 2 yrs per cardiology  . CATARACT EXTRACTION, BILATERAL    . COLONOSCOPY W/ BIOPSIES  12/19/2007   left-sided colitis, diverticulosis, hemorrhoids  . FLEXIBLE SIGMOIDOSCOPY  01/11/2008; 03/19/2010   2009 and 2011:segmental left colitis, diverticulosis, hemorrhoids  . INGUINAL HERNIA REPAIR     right  . MITRAL VALVE REPAIR  09/22/04   and modified Cox-Maze IV   . MITRAL VALVE REPAIR  09/14/05   redo MV repair d/t endocardiits/embolic events  . PROSTATECTOMY  06/2003   Radical  . RIGHT HEART CATHETERIZATION N/A 01/01/2015   Procedure: RIGHT HEART CATH;  Surgeon: Peter M Martinique, MD;  Location: [facility] CATH LAB;  Service: Cardiovascular;  Laterality: N/A;  . SALIVARY GLAND SURGERY     left resection  . TRANSTHORACIC ECHOCARDIOGRAM  11/2013; 07/2017   2015: Mod LVH, EF 55-60%, no WM abnormalities, +LA dilation, +severe RV dilation and impaired systolic fxn, +increased pulm art pressures.   Valves ok.  2018- EF 60-65%, wall motion nl, s/p MV repair, mild RV syst dysf.    Social History   Socioeconomic History  . Marital status: Married    Spouse name: Jeanett Schlein  . Number of children: 2  . Years of education: Not on file  . Highest education level: Not on file  Occupational History  . Occupation: RETIRED    Comment: Careers adviser: RETIRED  Social Needs  . Financial resource strain: Not on file  . Food insecurity:    Worry: Not on file    Inability: Not on file  . Transportation needs:    Medical: Not on file    Non-medical: Not on file  Tobacco Use  . Smoking status: Former Smoker    Packs/day: 3.00    Years: 60.00    Pack years: 180.00    Types: Cigarettes    Last attempt to quit:  09/19/2002    Years since quitting: 15.3  . Smokeless tobacco: Never Used  Substance and Sexual Activity  . Alcohol use: No    Alcohol/week: 0.0 oz  . Drug use: No  . Sexual activity: Not on file  Lifestyle  . Physical activity:    Days per week: Not on file    Minutes per session: Not on file  . Stress: Not on file  Relationships  . Social connections:    Talks on phone: Not on file    Gets together: Not on file    Attends religious service: Not on file    Active member of club or organization: Not on file    Attends meetings of clubs or organizations: Not on file    Relationship status: Not on file  . Intimate partner violence:    Fear of current or ex partner: Not on file    Emotionally abused: Not on file    Physically abused: Not on file    Forced sexual activity: Not on file  Other Topics Concern  . Not on file  Social History Narrative   Married, 2 children.   Retired Systems developer from [employer]   180 pack-year tob hx.  No alcohol.    Family History  Problem Relation Age of Onset  . Cancer Mother        spinal  . Stroke Father   . Heart disease Father   . Colon cancer Neg Hx   . Heart attack Neg Hx     ROS: no fevers or chills, productive  cough, hemoptysis, dysphasia, odynophagia, melena, hematochezia, dysuria, hematuria, rash, seizure activity, orthopnea, PND, pedal edema, claudication. Remaining systems are negative.  Physical Exam: Well-developed well-nourished in no acute distress.  Skin is warm and dry.  HEENT is normal.  Neck is supple.  Chest is clear to auscultation with normal expansion.  Cardiovascular exam is irregular Abdominal exam nontender or distended. No masses palpated. Extremities show trace edema. neuro grossly intact   A/P  1 paroxysmal atrial fibrillation-plan to continue Cardizem and digoxin.  Continue Coumadin.  2 coronary artery disease-continue statin.  He is not on aspirin given need for anticoagulation.  3 hypertension-blood pressure is controlled.  Continue present medications.  4 hyperlipidemia-continue statin.  5 status post mitral valve repair-continue SBE prophylaxis.  Note the patient's mean gradient across his mitral valve was elevated at last echocardiogram.  However, not clear that this is the only contributor to his dyspnea.  He also states he would not consider surgery again.  He would also be high risk.  Patient has a long tobacco history and there is a major component of COPD.  Continue present dose of Lasix.  6 carotid artery disease-mild on most recent Dopplers.  7 dyspnea-this is felt to be multifactorial including deconditioning, COPD and obesity hypoventilation syndrome.  Kirk Ruths, MD

## 2017-12-29 ENCOUNTER — Ambulatory Visit (INDEPENDENT_AMBULATORY_CARE_PROVIDER_SITE_OTHER): Payer: Medicare Other | Admitting: *Deleted

## 2017-12-29 ENCOUNTER — Encounter: Payer: Self-pay | Admitting: Emergency Medicine

## 2017-12-29 ENCOUNTER — Ambulatory Visit (INDEPENDENT_AMBULATORY_CARE_PROVIDER_SITE_OTHER): Payer: Medicare Other | Admitting: Emergency Medicine

## 2017-12-29 DIAGNOSIS — I4891 Unspecified atrial fibrillation: Secondary | ICD-10-CM | POA: Diagnosis not present

## 2017-12-29 DIAGNOSIS — I5032 Chronic diastolic (congestive) heart failure: Secondary | ICD-10-CM | POA: Diagnosis not present

## 2017-12-29 DIAGNOSIS — J9611 Chronic respiratory failure with hypoxia: Secondary | ICD-10-CM

## 2017-12-29 DIAGNOSIS — Z5181 Encounter for therapeutic drug level monitoring: Secondary | ICD-10-CM

## 2017-12-29 DIAGNOSIS — J438 Other emphysema: Secondary | ICD-10-CM | POA: Diagnosis not present

## 2017-12-29 LAB — POCT INR: INR: 3

## 2017-12-29 MED ORDER — ALBUTEROL SULFATE (2.5 MG/3ML) 0.083% IN NEBU: 2.5000 mg | INHALATION_SOLUTION | RESPIRATORY_TRACT | 5 refills | Status: AC

## 2017-12-29 NOTE — Assessment & Plan Note (Signed)
Please continue Stiolto 2 puffs once a day. I believe that you are on budesonide (Pulmicort) nebulizer treatments.  It sounds like you are taking this once a day in the morning.  You can increase this to twice a day. We will prescribe albuterol that can also be used the nebulizer.  You can use this up to every 4 hours if needed for shortness of breath, wheezing, chest tightness.  You use this differently than you do your other nebulizer medication which is on a schedule. Continue prednisone 20 mg daily. Continue oxygen at 3 L/min as you have been using it. Start mucinex (guaifenesin) 600 mg twice a day We will arrange for you to have an office visit with T Parrett to review your medications.  You will need to bring all your medications with you to that appointment (not a list). Follow with Dr Lamonte Sakai in 4 months or sooner if you have any problems

## 2017-12-29 NOTE — Patient Instructions (Signed)
Description   Continue taking Coumadin 1 tablet (5mg ) daily except 1/2 tablet (2.5mg ) on Mondays and Fridays while on the Prednisone. Recheck INR 3 weeks. Call with new medications or any changes [phone]. Be sure to keep intake of greens consistent

## 2017-12-29 NOTE — Progress Notes (Signed)
Subjective:    Patient ID: [name], male    DOB: 1936-05-27, 82 y.o.   MRN: [id number]  COPD  His past medical history is significant for COPD.    ROV 10/19/17 --[name] is a 82 year old man with a history of tobacco use, COPD with moderate to severe obstruction, associated hypoxemic respiratory failure.  Also with a history of mitral valve repair, atrial fibrillation (status post Maze procedure), coronary artery disease, history of stroke, secondary pulmonary hypertension. He s on chronic prednisone, increased back to 20 mg daily at his last visit in November. Stiolto daily. Albuterol about 4-5 x a day. He feels that his breathing is progressively worse. His walking tolerance is worse. He feels better on continuous O2, better walking tolerance. He cleaned the filter on his inogen POC, also increased to 3L/min.   ROV 12/29/17 --patient follows up today for his history of tobacco use and moderate to severe obstruction/COPD with associated hypoxemic respiratory failure.  He has a history of mitral valve repair, atrial fibrillation (maze), coronary disease with secondary pulmonary hypertension.  He has been on chronic prednisone long-term, currently on 20 mg daily. He is having more nasal congestion since last time. Also some increased LE edema for the last month, started glipizide at that time. Minimal wheeze, occasional cough happens  every day, productive of yellow to dark, thick. He is on [employer]. He uses a neb every morning, I think it is budesonide - he can't tell me. He believes that he has had nmore LE edema since last time. Has increased his lasix for last couple days. He is scheduled to see Dr Stanford Breed next week.   No flowsheet data found.  Review of Systems As per HPI     Objective:   Physical Exam Vitals:   12/29/17 0902 12/29/17 0903  BP:  120/62  Pulse:  (!) 104  SpO2: (!) 2% 90%  Weight: 193 lb (87.5 kg)   Height: 5\' 7"  (1.702 m)    Gen: Pleasant, elderly gentleman,  in no distress,  normal affect on 3 L/m pulse flow  ENT: No lesions,  mouth clear,  oropharynx clear, no postnasal drip  Neck: No JVD, no stridor  Lungs: No use of accessory muscles, Distant but clear.  No wheezing, no crackles  Cardiovascular: distant, irregular, no murmur or gallops, no peripheral edema  Musculoskeletal: No deformities, no cyanosis or clubbing  Neuro: alert, non focal  Skin: Warm, bruising on both deltoids      Assessment & Plan:  COPD (chronic obstructive pulmonary disease) with emphysema Please continue Stiolto 2 puffs once a day. I believe that you are on budesonide (Pulmicort) nebulizer treatments.  It sounds like you are taking this once a day in the morning.  You can increase this to twice a day. We will prescribe albuterol that can also be used the nebulizer.  You can use this up to every 4 hours if needed for shortness of breath, wheezing, chest tightness.  You use this differently than you do your other nebulizer medication which is on a schedule. Continue prednisone 20 mg daily. Continue oxygen at 3 L/min as you have been using it. Start mucinex (guaifenesin) 600 mg twice a day We will arrange for you to have an office visit with T Parrett to review your medications.  You will need to bring all your medications with you to that appointment (not a list). Follow with Dr Lamonte Sakai in 4 months or sooner if you have any problems  Chronic hypoxemic respiratory failure (HCC) Continue O2 at 3L/min  Chronic congestive heart failure (HCC) Agree with following up with Dr. Stanford Breed.  He may want to adjust your diuretics.  Your increased swelling could relate to the recent start of glipizide.  You should discuss this with him as well.  Baltazar Apo, MD, PhD 12/29/2017, 9:40 AM [facility] [phone] or if no answer [phone]

## 2017-12-29 NOTE — Patient Instructions (Addendum)
Please continue Stiolto 2 puffs once a day. I believe that you are on budesonide (Pulmicort) nebulizer treatments.  It sounds like you are taking this once a day in the morning.  You can increase this to twice a day. We will prescribe albuterol that can also be used the nebulizer.  You can use this up to every 4 hours if needed for shortness of breath, wheezing, chest tightness.  You use this differently than you do your other nebulizer medication which is on a schedule. Continue prednisone 20 mg daily. Continue oxygen at 3 L/min as you have been using it. Start loratadine 10mg  daily Start mucinex (guaifenesin) 600 mg twice a day Agree with following up with Dr. Stanford Breed.  He may want to adjust your diuretics.  Your increased swelling could relate to the recent start of glipizide.  You should discuss this with him as well. We will arrange for you to have an office visit with T Parrett to review your medications.  You will need to bring all your medications with you to that appointment (not a list). Follow with Dr Lamonte Sakai in 4 months or sooner if you have any problems.

## 2017-12-29 NOTE — Assessment & Plan Note (Signed)
Continue O2 at 3L/min

## 2017-12-29 NOTE — Assessment & Plan Note (Signed)
Agree with following up with Dr. Stanford Breed.  He may want to adjust your diuretics.  Your increased swelling could relate to the recent start of glipizide.  You should discuss this with him as well.

## 2017-12-30 ENCOUNTER — Other Ambulatory Visit: Payer: Self-pay | Admitting: Emergency Medicine

## 2018-01-08 ENCOUNTER — Other Ambulatory Visit: Payer: Self-pay | Admitting: Emergency Medicine

## 2018-01-10 ENCOUNTER — Ambulatory Visit (INDEPENDENT_AMBULATORY_CARE_PROVIDER_SITE_OTHER): Payer: Medicare Other | Admitting: Cardiology

## 2018-01-10 ENCOUNTER — Encounter: Payer: Self-pay | Admitting: Cardiology

## 2018-01-10 VITALS — BP 132/60 | HR 88 | Ht 68.0 in | Wt 193.0 lb

## 2018-01-10 DIAGNOSIS — I481 Persistent atrial fibrillation: Secondary | ICD-10-CM

## 2018-01-10 DIAGNOSIS — E7800 Pure hypercholesterolemia, unspecified: Secondary | ICD-10-CM

## 2018-01-10 DIAGNOSIS — I2510 Atherosclerotic heart disease of native coronary artery without angina pectoris: Secondary | ICD-10-CM

## 2018-01-10 DIAGNOSIS — I10 Essential (primary) hypertension: Secondary | ICD-10-CM

## 2018-01-10 DIAGNOSIS — I4819 Other persistent atrial fibrillation: Secondary | ICD-10-CM

## 2018-01-10 MED ORDER — DIGOXIN 125 MCG PO TABS: 0.1250 mg | ORAL_TABLET | Freq: Every day | ORAL | 3 refills | Status: DC

## 2018-01-10 MED ORDER — FUROSEMIDE 40 MG PO TABS: ORAL_TABLET | ORAL | 3 refills | Status: DC

## 2018-01-10 MED ORDER — WARFARIN SODIUM 5 MG PO TABS: 2.5000 mg | ORAL_TABLET | ORAL | 1 refills | Status: DC

## 2018-01-10 MED ORDER — PRAVASTATIN SODIUM 40 MG PO TABS: ORAL_TABLET | ORAL | 3 refills | Status: AC

## 2018-01-10 MED ORDER — DILTIAZEM HCL ER COATED BEADS 240 MG PO CP24: 240.0000 mg | ORAL_CAPSULE | Freq: Every day | ORAL | 3 refills | Status: AC

## 2018-01-10 NOTE — Patient Instructions (Signed)
Your physician wants you to follow-up in: 6 MONTHS WITH DR CRENSHAW You will receive a reminder letter in the mail two months in advance. If you don't receive a letter, please call our office to schedule the follow-up appointment.   If you need a refill on your cardiac medications before your next appointment, please call your pharmacy.  

## 2018-01-15 ENCOUNTER — Ambulatory Visit: Payer: Medicare Other | Admitting: Family Medicine

## 2018-01-16 ENCOUNTER — Ambulatory Visit (INDEPENDENT_AMBULATORY_CARE_PROVIDER_SITE_OTHER): Payer: Medicare Other | Admitting: Family Medicine

## 2018-01-16 ENCOUNTER — Encounter: Payer: Self-pay | Admitting: Family Medicine

## 2018-01-16 VITALS — BP 111/68 | HR 86 | Temp 97.5°F | Resp 16 | Ht 68.0 in | Wt 194.2 lb

## 2018-01-16 DIAGNOSIS — E7800 Pure hypercholesterolemia, unspecified: Secondary | ICD-10-CM | POA: Diagnosis not present

## 2018-01-16 DIAGNOSIS — N183 Chronic kidney disease, stage 3 unspecified: Secondary | ICD-10-CM

## 2018-01-16 DIAGNOSIS — I10 Essential (primary) hypertension: Secondary | ICD-10-CM

## 2018-01-16 DIAGNOSIS — E119 Type 2 diabetes mellitus without complications: Secondary | ICD-10-CM | POA: Diagnosis not present

## 2018-01-16 DIAGNOSIS — I2510 Atherosclerotic heart disease of native coronary artery without angina pectoris: Secondary | ICD-10-CM

## 2018-01-16 LAB — BASIC METABOLIC PANEL
BUN: 16 mg/dL (ref 6–23)
CALCIUM: 9.9 mg/dL (ref 8.4–10.5)
CO2: 31 meq/L (ref 19–32)
CREATININE: 1.1 mg/dL (ref 0.40–1.50)
Chloride: 98 mEq/L (ref 96–112)
GFR: 68.44 mL/min (ref >60.00)
GLUCOSE: 161 mg/dL — AB (ref 70–99)
Potassium: 3.7 mEq/L (ref 3.5–5.1)
SODIUM: 140 meq/L (ref 135–145)

## 2018-01-16 LAB — MICROALBUMIN / CREATININE URINE RATIO
Creatinine,U: 152.6 mg/dL
MICROALB/CREAT RATIO: 2.3 mg/g (ref 0.0–30.0)
Microalb, Ur: 3.5 mg/dL — ABNORMAL HIGH (ref 0.0–1.9)

## 2018-01-16 LAB — HEMOGLOBIN A1C: Hgb A1c MFr Bld: 7.6 % — ABNORMAL HIGH (ref 4.6–6.5)

## 2018-01-16 NOTE — Progress Notes (Signed)
OFFICE VISIT  01/16/2018   CC:  Chief Complaint  Patient presents with  . Follow-up    RCI, pt is fasting.    HPI:    Patient is a 82 y.o. Caucasian male who presents accompanied by his wife for f/u HTN, DM 2, Hypercholesterolemia, CRI stage III. He has chronic hypoxemia resp failure secondary to severe COPD and chronic diastolic HF. He also has chronic a fib, on rate control and his coumadin is managed by coumadin clinic. Has chronic DOE:  Deconditioning, COPD, obesity hypoventilation syndrome.  DM: rare glucose monitoring.  One check he can recall was 108. He stopped taking the glipizide I rx'd last routine f/u visit but he and wife don't recall why. HTN: avg 120/60, no recollection of HR. Diet: no low sodium diet. HLD: takes pravastatin daily, no side effects.  CRI: hydrates well, avoids NSAIDs.  Past Medical History:  Diagnosis Date  . Adrenal adenoma   . Atrial fibrillation (HCC)    Rate control + Coumadin  . Atrial flutter ([place])   . CAD (coronary artery disease)    Lexiscan Myoview (01/2014): Lateral soft tissue attenuation, no ischemia, not gated, low risk  . Cancer ([place])   . CAP (community acquired pneumonia)    Hospitalized 10/2014  . Chronic diastolic heart failure ([place])   . Chronic hypoxemic respiratory failure (HCC)    from COPD  . Chronic renal insufficiency, stage III (moderate) (HCC)    CrCl 50s  . COPD (chronic obstructive pulmonary disease) (HCC)    oxygen-dependent (2.5 L 24/7).  Improved on starting chronic prednisone 06/2017 (increased to 20 mg qd 08/2017)  Progressive worsening dyspnea at pulm f/u 10/19/17--? changed to continuous 02 from pulsed delivery (?)--no change in med regimen.  . Diabetes mellitus without complication ([place]) 08/67/6195   A1c 6.5 % 05/2016  . Diverticulosis   . DOE (dyspnea on exertion)    Multifactorial: deconditioning, COPD, obesity hypoventilation syndrome.  Marland Kitchen Dyslipidemia   . Embolic stroke ([place])    d/t endocarditis 2008   . Hemorrhoids   . History of mitral valve replacement    needs SBE prophylaxis  . History of prostate cancer remote past   f/u by Dr. Claris Che; no recurrence as of f/u 10/2015.  PSA rising from 2013 to 06/2016: as of 09/2017 plan is bone scan and CT abd/pelv if PSA gets up around 10.  Marland Kitchen HTN (hypertension)   . Mediastinal lymphadenopathy   . Pulmonary hypertension ([place]) 01/09/2015   secondary  . Right thyroid nodule   . Thrombocytopenia ([place])   . TIA (transient ischemic attack) 03/25/2011   ? of due to transient diplopia  . Ulcerative colitis    left-sided/segmental, associated with diverticulosis.  Sulfasalazine per GI (Dr. Carlean Purl).  . Warthin's tumor     Past Surgical History:  Procedure Laterality Date  . carotid duplex dopplers  04/2014   No signif plaques; repeat 2 yrs per cardiology  . CATARACT EXTRACTION, BILATERAL    . COLONOSCOPY W/ BIOPSIES  12/19/2007   left-sided colitis, diverticulosis, hemorrhoids  . FLEXIBLE SIGMOIDOSCOPY  01/11/2008; 03/19/2010   2009 and 2011:segmental left colitis, diverticulosis, hemorrhoids  . INGUINAL HERNIA REPAIR     right  . MITRAL VALVE REPAIR  09/22/04   and modified Cox-Maze IV   . MITRAL VALVE REPAIR  09/14/05   redo MV repair d/t endocardiits/embolic events  . PROSTATECTOMY  06/2003   Radical  . RIGHT HEART CATHETERIZATION N/A 01/01/2015   Procedure: RIGHT HEART CATH;  Surgeon:  Peter M Martinique, MD;  Location: [facility] CATH LAB;  Service: Cardiovascular;  Laterality: N/A;  . SALIVARY GLAND SURGERY     left resection  . TRANSTHORACIC ECHOCARDIOGRAM  11/2013; 07/2017   2015: Mod LVH, EF 55-60%, no WM abnormalities, +LA dilation, +severe RV dilation and impaired systolic fxn, +increased pulm art pressures.  Valves ok.  2018- EF 60-65%, wall motion nl, s/p MV repair, mild RV syst dysf.     Outpatient Medications Prior to Visit  Medication Sig Dispense Refill  . albuterol (PROVENTIL HFA;VENTOLIN HFA) 108 (90 Base) MCG/ACT inhaler INHALE ONE PUFF BY  MOUTH EVERY 6 HOURS AS NEEDED FOR WHEEZING AND FOR SHORTNESS OF BREATH (USING 5-6 TIMES EVERY 24 HOURS) 1 Inhaler 5  . albuterol (PROVENTIL) (2.5 MG/3ML) 0.083% nebulizer solution Take 3 mLs (2.5 mg total) by nebulization every 4 (four) hours. And as needed 300 mL 5  . budesonide (PULMICORT) 0.5 MG/2ML nebulizer solution USE ONE VIAL IN NEBULIZER TWICE DAILY 120 mL 5  . digoxin (LANOXIN) 0.125 MG tablet Take 1 tablet (0.125 mg total) by mouth daily. 90 tablet 3  . diltiazem (CARDIZEM CD) 240 MG 24 hr capsule Take 1 capsule (240 mg total) by mouth daily. 90 capsule 3  . furosemide (LASIX) 40 MG tablet Take 1 and 1/2 tab po qd 135 tablet 3  . LORazepam (ATIVAN) 0.5 MG tablet Take 1 tablet (0.5 mg total) by mouth 2 (two) times daily as needed for anxiety. 30 tablet 2  . olopatadine (PATANOL) 0.1 % ophthalmic solution Place 1 drop into both eyes 2 (two) times daily. 5 mL 12  . pravastatin (PRAVACHOL) 40 MG tablet TAKE 1 TABLET EVERY EVENING 90 tablet 3  . predniSONE (DELTASONE) 20 MG tablet Take 1 tablet (20 mg total) by mouth daily with breakfast. 60 tablet 0  . STIOLTO RESPIMAT 2.5-2.5 MCG/ACT AERS INHALE 2 PUFFS INTO THE LUNGS DAILY. 12 g 3  . warfarin (COUMADIN) 5 MG tablet Take 0.5-1 tablets (2.5-5 mg total) by mouth See admin instructions. 5mg  daily except Friday take 2.5mg  AS DIRECTED BY COUMADIN CLINIC (Patient taking differently: Take 2.5-5 mg by mouth See admin instructions. 5mg  daily except Friday and Monday take 2.5mg  AS DIRECTED BY COUMADIN CLINIC) 90 tablet 1  . albuterol (PROVENTIL HFA;VENTOLIN HFA) 108 (90 Base) MCG/ACT inhaler INHALE ONE PUFF BY MOUTH EVERY 6 HOURS AS NEEDED FOR WHEEZING AND FOR SHORTNESS OF BREATH (USING 5-6 TIMES EVERY 24 HOURS) (Patient not taking: Reported on 01/16/2018) 3 each 1  . glipiZIDE (GLIPIZIDE XL) 2.5 MG 24 hr tablet Take 1 tablet (2.5 mg total) by mouth daily with breakfast. (Patient not taking: Reported on 01/16/2018) 90 tablet 1   No facility-administered  medications prior to visit.     No Known Allergies  ROS As per HPI  PE: Blood pressure 111/68, pulse 86, temperature (!) 97.5 F (36.4 C), temperature source Oral, resp. rate 16, height 5\' 8"  (1.727 m), weight 194 lb 4 oz (88.1 kg), SpO2 92 %.2L oxygen Gen: Alert, well appearing.  Patient is oriented to person, place, time, and situation. SMO:LMBE: no injection, icteris, swelling, or exudate.  EOMI, PERRLA. Mouth: lips without lesion/swelling.  Oral mucosa pink and moist. Oropharynx without erythema, exudate, or swelling.  CV: irreg irreg, distant S1 and S2, no m/r Chest is clear, no wheezing or rales. Normal symmetric air entry throughout both lung fields. No chest wall deformities or tenderness. EXT: 4+ edema RLL, 2+ in L LL.  No clubbing or cyanosis.  LABS:  Lab Results  Component Value Date   TSH 3.24 12/09/2014   Lab Results  Component Value Date   WBC 8.4 10/30/2017   HGB 14.0 10/30/2017   HCT 42.7 10/30/2017   MCV 91.0 10/30/2017   PLT 138 (L) 10/30/2017   Lab Results  Component Value Date   CREATININE 1.14 10/30/2017   BUN 9 10/30/2017   NA 139 10/30/2017   K 3.8 10/30/2017   CL 101 10/30/2017   CO2 26 10/30/2017   Lab Results  Component Value Date   ALT 17 07/19/2017   AST 18 07/19/2017   ALKPHOS 56 07/19/2017   BILITOT 1.2 07/19/2017   Lab Results  Component Value Date   CHOL 159 10/16/2017   Lab Results  Component Value Date   HDL 46.50 10/16/2017   Lab Results  Component Value Date   LDLCALC 79 10/12/2016   Lab Results  Component Value Date   TRIG 224.0 (H) 10/16/2017   Lab Results  Component Value Date   CHOLHDL 3 10/16/2017   Lab Results  Component Value Date   PSA 0.08 Test Methodology: Hybritech PSA (L) 12/18/2007   PSA 0.04 Test Methodology: Hybritech PSA (L) 11/24/2007   Lab Results  Component Value Date   INR 3.0 12/29/2017   INR 3.2 12/11/2017   INR 3.5 11/13/2017   PROTIME 18.5 03/10/2009   PROTIME 15.9 02/26/2009    PROTIME 14.2 02/20/2009    IMPRESSION AND PLAN:  1) DM 2:  HbA1c today. Urine microalb/cr today. Pt noncompliant with glipizide that I rx'd last f/u visit--unknown reason.  2) HTN: The current medical regimen is effective;  continue present plan and medications. Lytes/cr today.  3) HLD: tolerating statin.  Last chol/hdl ratio 3.  4) CRI stage 3: hydrates well.  Avoids NSAIDs. Lytes/cr today.  An After Visit Summary was printed and given to the patient.  FOLLOW UP: Return in about 3 months (around 04/17/2018) for routine chronic illness f/u.  Signed:  Crissie Sickles, MD           01/16/2018

## 2018-01-19 ENCOUNTER — Encounter: Payer: Medicare Other | Admitting: Adult Health

## 2018-01-23 ENCOUNTER — Ambulatory Visit (INDEPENDENT_AMBULATORY_CARE_PROVIDER_SITE_OTHER): Payer: Medicare Other | Admitting: *Deleted

## 2018-01-23 DIAGNOSIS — Z5181 Encounter for therapeutic drug level monitoring: Secondary | ICD-10-CM

## 2018-01-23 DIAGNOSIS — I4891 Unspecified atrial fibrillation: Secondary | ICD-10-CM | POA: Diagnosis not present

## 2018-01-23 LAB — POCT INR: INR: 2.1

## 2018-01-23 NOTE — Patient Instructions (Signed)
Description   Continue taking Coumadin 1 tablet (5mg ) daily except 1/2 tablet (2.5mg ) on Mondays and Fridays while on the Prednisone. Recheck INR 4 weeks. Call with new medications or any changes [phone]. Be sure to keep intake of greens consistent

## 2018-02-02 ENCOUNTER — Ambulatory Visit (INDEPENDENT_AMBULATORY_CARE_PROVIDER_SITE_OTHER): Payer: Medicare Other | Admitting: Adult Health

## 2018-02-02 ENCOUNTER — Encounter: Payer: Self-pay | Admitting: Adult Health

## 2018-02-02 DIAGNOSIS — J439 Emphysema, unspecified: Secondary | ICD-10-CM | POA: Diagnosis not present

## 2018-02-02 DIAGNOSIS — J9611 Chronic respiratory failure with hypoxia: Secondary | ICD-10-CM | POA: Diagnosis not present

## 2018-02-02 DIAGNOSIS — I509 Heart failure, unspecified: Secondary | ICD-10-CM

## 2018-02-02 NOTE — Assessment & Plan Note (Signed)
Moderate to severe COPD Needs spirometry on return  Patient's medications were reviewed today and patient education was given. Computerized medication calendar was adjusted/completed   Plan Patient Instructions  Continue on Stiolto 2 puffs daily  Continue on Pulmicort Neb Twice daily  .  Mucinex DM Twice daily  As needed  Cough/congestion .  Continue on Oxygen 3l/m  Follow med calendar closely and bring to each visit .  Follow up with Dr. Lamonte Sakai in 3 months and As needed

## 2018-02-02 NOTE — Assessment & Plan Note (Signed)
Cont on O2 .  

## 2018-02-02 NOTE — Progress Notes (Signed)
@Patient  ID: [name], male    DOB: 1936/01/23, 82 y.o.   MRN: [id number]  Chief Complaint  Patient presents with  . Follow-up    COPD     Referring provider: Tammi Sou, MD  HPI: 82 yo male former smoker followed for COPD and Oxygen dependent RF  PMH A Fib , MV repair , CAD , CVA and pulmonary HTN on coumadin    02/02/2018 Follow up : COPD and O2 RF  Patient returns for a one-month follow-up.  Patient has moderate COPD. Remains on Pulmicort Neb Twice daily  , Stiolto 2 puffs daily . Uses albuterol inhaler 4-5 x day .  Says he gets short of breath easily with minimal activity.  Denies any increased cough or wheezing. We reviewed all his medications and organize them into a medication encounter with patient education.  It appears that he is taking his medications correctly.  Patient education was given He denies any chest pain, increased edema or orthopnea.  Remains on Oxygen 3l/m .   No Known Allergies  Immunization History  Administered Date(s) Administered  . Influenza Whole 06/05/2008, 09/28/2008, 07/31/2012  . Influenza, High Dose Seasonal PF 06/27/2013, 06/12/2014, 06/05/2015, 06/13/2016, 07/03/2017  . Pneumococcal Conjugate-13 12/03/2014  . Pneumococcal-Unspecified 09/19/2008  . Tdap 06/05/2015    Past Medical History:  Diagnosis Date  . Adrenal adenoma   . Atrial fibrillation (HCC)    Rate control + Coumadin  . Atrial flutter ([place])   . CAD (coronary artery disease)    Lexiscan Myoview (01/2014): Lateral soft tissue attenuation, no ischemia, not gated, low risk  . Cancer ([place])   . CAP (community acquired pneumonia)    Hospitalized 10/2014  . Chronic diastolic heart failure ([place])   . Chronic hypoxemic respiratory failure (HCC)    from COPD  . Chronic renal insufficiency, stage III (moderate) (HCC)    CrCl 50s  . COPD (chronic obstructive pulmonary disease) (HCC)    oxygen-dependent (2.5 L 24/7).  Improved on starting chronic prednisone 06/2017  (increased to 20 mg qd 08/2017)  Progressive worsening dyspnea at pulm f/u 10/19/17--? changed to continuous 02 from pulsed delivery (?)--no change in med regimen.  . Diabetes mellitus without complication ([place]) 27/02/2375   A1c 6.5 % 05/2016  . Diverticulosis   . DOE (dyspnea on exertion)    Multifactorial: deconditioning, COPD, obesity hypoventilation syndrome.  Marland Kitchen Dyslipidemia   . Embolic stroke ([place])    d/t endocarditis 2008  . Hemorrhoids   . History of mitral valve replacement    needs SBE prophylaxis  . History of prostate cancer remote past   f/u by Dr. Claris Che; no recurrence as of f/u 10/2015.  PSA rising from 2013 to 06/2016: as of 09/2017 plan is bone scan and CT abd/pelv if PSA gets up around 10.  Marland Kitchen HTN (hypertension)   . Mediastinal lymphadenopathy   . Pulmonary hypertension ([place]) 01/09/2015   secondary  . Right thyroid nodule   . Thrombocytopenia ([place])   . TIA (transient ischemic attack) 03/25/2011   ? of due to transient diplopia  . Ulcerative colitis    left-sided/segmental, associated with diverticulosis.  Sulfasalazine per GI (Dr. Carlean Purl).  . Warthin's tumor     Tobacco History: Social History   Tobacco Use  Smoking Status Former Smoker  . Packs/day: 3.00  . Years: 60.00  . Pack years: 180.00  . Types: Cigarettes  . Last attempt to quit: 09/19/2002  . Years since quitting: 15.3  Smokeless Tobacco Never  Used   Counseling given: Not Answered   Outpatient Encounter Medications as of 02/02/2018  Medication Sig  . albuterol (PROVENTIL HFA;VENTOLIN HFA) 108 (90 Base) MCG/ACT inhaler INHALE ONE PUFF BY MOUTH EVERY 6 HOURS AS NEEDED FOR WHEEZING AND FOR SHORTNESS OF BREATH (USING 5-6 TIMES EVERY 24 HOURS)  . albuterol (PROVENTIL) (2.5 MG/3ML) 0.083% nebulizer solution Take 3 mLs (2.5 mg total) by nebulization every 4 (four) hours. And as needed  . budesonide (PULMICORT) 0.5 MG/2ML nebulizer solution USE ONE VIAL IN NEBULIZER TWICE DAILY  . digoxin (LANOXIN) 0.125 MG  tablet Take 1 tablet (0.125 mg total) by mouth daily.  Marland Kitchen diltiazem (CARDIZEM CD) 240 MG 24 hr capsule Take 1 capsule (240 mg total) by mouth daily.  . furosemide (LASIX) 40 MG tablet Take 1 and 1/2 tab po qd  . LORazepam (ATIVAN) 0.5 MG tablet Take 1 tablet (0.5 mg total) by mouth 2 (two) times daily as needed for anxiety.  Marland Kitchen olopatadine (PATANOL) 0.1 % ophthalmic solution Place 1 drop into both eyes 2 (two) times daily.  . pravastatin (PRAVACHOL) 40 MG tablet TAKE 1 TABLET EVERY EVENING  . predniSONE (DELTASONE) 20 MG tablet Take 1 tablet (20 mg total) by mouth daily with breakfast.  . STIOLTO RESPIMAT 2.5-2.5 MCG/ACT AERS INHALE 2 PUFFS INTO THE LUNGS DAILY.  Marland Kitchen warfarin (COUMADIN) 5 MG tablet Take 0.5-1 tablets (2.5-5 mg total) by mouth See admin instructions. 5mg  daily except Friday take 2.5mg  AS DIRECTED BY COUMADIN CLINIC (Patient taking differently: Take 2.5-5 mg by mouth See admin instructions. 5mg  daily except Friday and Monday take 2.5mg  AS DIRECTED BY COUMADIN CLINIC)   No facility-administered encounter medications on file as of 02/02/2018.      Review of Systems  Constitutional:   No  weight loss, night sweats,  Fevers, chills,  +fatigue, or  lassitude.  HEENT:   No headaches,  Difficulty swallowing,  Tooth/dental problems, or  Sore throat,                No sneezing, itching, ear ache, nasal congestion, post nasal drip,   CV:  No chest pain,  Orthopnea, PND, swelling in lower extremities, anasarca, dizziness, palpitations, syncope.   GI  No heartburn, indigestion, abdominal pain, nausea, vomiting, diarrhea, change in bowel habits, loss of appetite, bloody stools.   Resp:    No chest wall deformity  Skin: no rash or lesions.  GU: no dysuria, change in color of urine, no urgency or frequency.  No flank pain, no hematuria   MS:  No joint pain or swelling.  No decreased range of motion.  No back pain.    Physical Exam  BP 110/62 (BP Location: Left Arm, Cuff Size: Normal)    Pulse 66   Ht 5\' 7"  (1.702 m)   Wt 192 lb (87.1 kg)   SpO2 94%   BMI 30.07 kg/m   GEN: A/Ox3; pleasant , NAD, elderly obese on oxygen in wheelchair  HEENT:  [place]/AT,  EACs-clear, TMs-wnl, NOSE-clear, THROAT-clear, no lesions, no postnasal drip or exudate noted.   NECK:  Supple w/ fair ROM; no JVD; normal carotid impulses w/o bruits; no thyromegaly or nodules palpated; no lymphadenopathy.    RESP decreased breath sounds in the bases  no accessory muscle use, no dullness to percussion  CARD:  RRR, no m/r/g, trace peripheral edema, pulses intact, no cyanosis or clubbing.  GI:   Soft & nt; nml bowel sounds; no organomegaly or masses detected.   Musco: Warm bil, no  deformities or joint swelling noted.   Neuro: alert, no focal deficits noted.    Skin: Warm, no lesions or rashes    Lab Results:    Imaging: No results found.   Assessment & Plan:   COPD (chronic obstructive pulmonary disease) with emphysema Moderate to severe COPD Needs spirometry on return  Patient's medications were reviewed today and patient education was given. Computerized medication calendar was adjusted/completed   Plan Patient Instructions  Continue on Stiolto 2 puffs daily  Continue on Pulmicort Neb Twice daily  .  Mucinex DM Twice daily  As needed  Cough/congestion .  Continue on Oxygen 3l/m  Follow med calendar closely and bring to each visit .  Follow up with Dr. Lamonte Sakai in 3 months and As needed       Chronic hypoxemic respiratory failure ([place]) Cont on O2   Chronic congestive heart failure ([place]) Cont on current regimen      Rexene Edison, NP 02/02/2018

## 2018-02-02 NOTE — Assessment & Plan Note (Signed)
Cont on current regimen  

## 2018-02-02 NOTE — Patient Instructions (Signed)
Continue on Stiolto 2 puffs daily  Continue on Pulmicort Neb Twice daily  .  Mucinex DM Twice daily  As needed  Cough/congestion .  Continue on Oxygen 3l/m  Follow med calendar closely and bring to each visit .  Follow up with Dr. Lamonte Sakai in 3 months and As needed

## 2018-02-05 NOTE — Addendum Note (Signed)
Addended by: Della Goo C on: 02/05/2018 03:57 PM   Modules accepted: Orders

## 2018-02-27 ENCOUNTER — Ambulatory Visit (INDEPENDENT_AMBULATORY_CARE_PROVIDER_SITE_OTHER): Payer: Medicare Other | Admitting: *Deleted

## 2018-02-27 DIAGNOSIS — Z5181 Encounter for therapeutic drug level monitoring: Secondary | ICD-10-CM

## 2018-02-27 DIAGNOSIS — I4891 Unspecified atrial fibrillation: Secondary | ICD-10-CM

## 2018-02-27 LAB — POCT INR: INR: 3.1 — AB (ref 2.0–3.0)

## 2018-02-27 NOTE — Patient Instructions (Signed)
Description   Change your dose to 1 tablet daily except 1/2 tablet on Mondays, Wednesdays and Fridays while on the Prednisone. Recheck INR 2 weeks. Call with new medications or any changes [phone]. Be sure to keep intake of greens consistent

## 2018-03-01 ENCOUNTER — Other Ambulatory Visit: Payer: Self-pay

## 2018-03-01 ENCOUNTER — Ambulatory Visit: Payer: Medicare Other | Admitting: Family Medicine

## 2018-03-01 ENCOUNTER — Emergency Department (HOSPITAL_BASED_OUTPATIENT_CLINIC_OR_DEPARTMENT_OTHER)
Admission: EM | Admit: 2018-03-01 | Discharge: 2018-03-01 | Disposition: A | Discharge status: Home / Self Care | Payer: Medicare Other | Source: Home / Self Care | Attending: Emergency Medicine | Admitting: Emergency Medicine

## 2018-03-01 ENCOUNTER — Emergency Department (HOSPITAL_BASED_OUTPATIENT_CLINIC_OR_DEPARTMENT_OTHER): Payer: Medicare Other

## 2018-03-01 ENCOUNTER — Telehealth: Payer: Self-pay

## 2018-03-01 ENCOUNTER — Encounter (HOSPITAL_BASED_OUTPATIENT_CLINIC_OR_DEPARTMENT_OTHER): Payer: Self-pay | Admitting: Emergency Medicine

## 2018-03-01 DIAGNOSIS — E1165 Type 2 diabetes mellitus with hyperglycemia: Secondary | ICD-10-CM | POA: Diagnosis not present

## 2018-03-01 DIAGNOSIS — Z7901 Long term (current) use of anticoagulants: Secondary | ICD-10-CM

## 2018-03-01 DIAGNOSIS — Z87891 Personal history of nicotine dependence: Secondary | ICD-10-CM

## 2018-03-01 DIAGNOSIS — J441 Chronic obstructive pulmonary disease with (acute) exacerbation: Secondary | ICD-10-CM

## 2018-03-01 DIAGNOSIS — N179 Acute kidney failure, unspecified: Secondary | ICD-10-CM | POA: Diagnosis not present

## 2018-03-01 DIAGNOSIS — E1122 Type 2 diabetes mellitus with diabetic chronic kidney disease: Secondary | ICD-10-CM

## 2018-03-01 DIAGNOSIS — J439 Emphysema, unspecified: Secondary | ICD-10-CM | POA: Diagnosis not present

## 2018-03-01 DIAGNOSIS — J1289 Other viral pneumonia: Secondary | ICD-10-CM | POA: Diagnosis not present

## 2018-03-01 DIAGNOSIS — I130 Hypertensive heart and chronic kidney disease with heart failure and stage 1 through stage 4 chronic kidney disease, or unspecified chronic kidney disease: Secondary | ICD-10-CM

## 2018-03-01 DIAGNOSIS — I10 Essential (primary) hypertension: Secondary | ICD-10-CM | POA: Diagnosis not present

## 2018-03-01 DIAGNOSIS — J189 Pneumonia, unspecified organism: Secondary | ICD-10-CM

## 2018-03-01 DIAGNOSIS — R05 Cough: Secondary | ICD-10-CM | POA: Diagnosis not present

## 2018-03-01 DIAGNOSIS — I2510 Atherosclerotic heart disease of native coronary artery without angina pectoris: Secondary | ICD-10-CM | POA: Insufficient documentation

## 2018-03-01 DIAGNOSIS — J181 Lobar pneumonia, unspecified organism: Principal | ICD-10-CM

## 2018-03-01 DIAGNOSIS — I5032 Chronic diastolic (congestive) heart failure: Secondary | ICD-10-CM | POA: Insufficient documentation

## 2018-03-01 DIAGNOSIS — R0902 Hypoxemia: Secondary | ICD-10-CM | POA: Diagnosis not present

## 2018-03-01 DIAGNOSIS — R0602 Shortness of breath: Secondary | ICD-10-CM | POA: Diagnosis not present

## 2018-03-01 DIAGNOSIS — Z954 Presence of other heart-valve replacement: Secondary | ICD-10-CM

## 2018-03-01 DIAGNOSIS — Z8546 Personal history of malignant neoplasm of prostate: Secondary | ICD-10-CM

## 2018-03-01 DIAGNOSIS — Z9981 Dependence on supplemental oxygen: Secondary | ICD-10-CM

## 2018-03-01 DIAGNOSIS — J9621 Acute and chronic respiratory failure with hypoxia: Secondary | ICD-10-CM | POA: Diagnosis not present

## 2018-03-01 DIAGNOSIS — Z79899 Other long term (current) drug therapy: Secondary | ICD-10-CM

## 2018-03-01 DIAGNOSIS — J9811 Atelectasis: Secondary | ICD-10-CM | POA: Diagnosis not present

## 2018-03-01 DIAGNOSIS — N183 Chronic kidney disease, stage 3 (moderate): Secondary | ICD-10-CM | POA: Insufficient documentation

## 2018-03-01 DIAGNOSIS — I5033 Acute on chronic diastolic (congestive) heart failure: Secondary | ICD-10-CM | POA: Diagnosis not present

## 2018-03-01 DIAGNOSIS — I4891 Unspecified atrial fibrillation: Secondary | ICD-10-CM | POA: Diagnosis not present

## 2018-03-01 DIAGNOSIS — R739 Hyperglycemia, unspecified: Secondary | ICD-10-CM | POA: Diagnosis not present

## 2018-03-01 LAB — CBC WITH DIFFERENTIAL/PLATELET
Basophils Absolute: 0 10*3/uL (ref 0.0–0.1)
Basophils Relative: 0 %
EOS ABS: 0 10*3/uL (ref 0.0–0.7)
EOS PCT: 0 %
HCT: 41.9 % (ref 39.0–52.0)
Hemoglobin: 14 g/dL (ref 13.0–17.0)
LYMPHS ABS: 1.1 10*3/uL (ref 0.7–4.0)
Lymphocytes Relative: 10 %
MCH: 29.2 pg (ref 26.0–34.0)
MCHC: 33.4 g/dL (ref 30.0–36.0)
MCV: 87.5 fL (ref 78.0–100.0)
MONO ABS: 0.7 10*3/uL (ref 0.1–1.0)
Monocytes Relative: 6 %
NEUTROS PCT: 84 %
Neutro Abs: 9.6 10*3/uL — ABNORMAL HIGH (ref 1.7–7.7)
PLATELETS: 133 10*3/uL — AB (ref 150–400)
RBC: 4.79 MIL/uL (ref 4.22–5.81)
RDW: 15.2 % (ref 11.5–15.5)
WBC: 11.4 10*3/uL — AB (ref 4.0–10.5)

## 2018-03-01 LAB — COMPREHENSIVE METABOLIC PANEL
ALK PHOS: 68 U/L (ref 38–126)
ALT: 18 U/L (ref 17–63)
AST: 20 U/L (ref 15–41)
Albumin: 3.7 g/dL (ref 3.5–5.0)
Anion gap: 11 (ref 5–15)
BUN: 17 mg/dL (ref 6–20)
CALCIUM: 8.9 mg/dL (ref 8.9–10.3)
CO2: 26 mmol/L (ref 22–32)
CREATININE: 1.36 mg/dL — AB (ref 0.61–1.24)
Chloride: 97 mmol/L — ABNORMAL LOW (ref 101–111)
GFR, EST AFRICAN AMERICAN: 55 mL/min — AB (ref >60)
GFR, EST NON AFRICAN AMERICAN: 48 mL/min — AB (ref >60)
Glucose, Bld: 295 mg/dL — ABNORMAL HIGH (ref 65–99)
Potassium: 3.7 mmol/L (ref 3.5–5.1)
Sodium: 134 mmol/L — ABNORMAL LOW (ref 135–145)
Total Bilirubin: 1.1 mg/dL (ref 0.3–1.2)
Total Protein: 6.8 g/dL (ref 6.5–8.1)

## 2018-03-01 LAB — BRAIN NATRIURETIC PEPTIDE: B Natriuretic Peptide: 107.1 pg/mL — ABNORMAL HIGH (ref 0.0–100.0)

## 2018-03-01 MED ORDER — IPRATROPIUM-ALBUTEROL 0.5-2.5 (3) MG/3ML IN SOLN
3.0000 mL | Freq: Once | RESPIRATORY_TRACT | Status: AC
  Administered 2018-03-01: 3 mL via RESPIRATORY_TRACT
  Filled 2018-03-01: qty 3

## 2018-03-01 MED ORDER — LIDOCAINE HCL (PF) 1 % IJ SOLN: 5.0000 mL | Freq: Once | INTRAMUSCULAR | Status: DC

## 2018-03-01 MED ORDER — PREDNISONE 50 MG PO TABS: 50.0000 mg | ORAL_TABLET | Freq: Every day | ORAL | 0 refills | Status: DC

## 2018-03-01 MED ORDER — AZITHROMYCIN 250 MG PO TABS: ORAL_TABLET | ORAL | 0 refills | Status: DC

## 2018-03-01 MED ORDER — METHYLPREDNISOLONE SODIUM SUCC 125 MG IJ SOLR
125.0000 mg | Freq: Once | INTRAMUSCULAR | Status: AC
  Administered 2018-03-01: 125 mg via INTRAVENOUS
  Filled 2018-03-01: qty 2

## 2018-03-01 MED ORDER — CEFDINIR 300 MG PO CAPS: 300.0000 mg | ORAL_CAPSULE | Freq: Two times a day (BID) | ORAL | 0 refills | Status: DC

## 2018-03-01 MED ORDER — SODIUM CHLORIDE 0.9 % IV SOLN
1.0000 g | Freq: Once | INTRAVENOUS | Status: AC
  Administered 2018-03-01: 1 g via INTRAVENOUS
  Filled 2018-03-01: qty 10

## 2018-03-01 MED ORDER — ALBUTEROL SULFATE (2.5 MG/3ML) 0.083% IN NEBU: 2.5000 mg | INHALATION_SOLUTION | RESPIRATORY_TRACT | Status: DC | PRN

## 2018-03-01 MED FILL — predniSONE 50 MG TABS: 50 | 3 days supply | Qty: 3 | Fill #0

## 2018-03-01 MED FILL — AZITHROMYCIN 250 MG TABLET: 250 | 5 days supply | Qty: 6 | Fill #0

## 2018-03-01 MED FILL — CEFDINIR 300 MG CAPS: 300 | 10 days supply | Qty: 20 | Fill #0

## 2018-03-01 NOTE — ED Provider Notes (Signed)
Seen and evaluated.  Discussed with Dr. Juanito Doom.  Patient has diffuse rhonchi.  I query whether he may have some chronic interstitial fibrosis.  Chest x-ray read as possible left lower lobe pneumonia which I agree with.  Read as possible edema.  I think this is very likely not interstitial edema but likely fibrosis.  However BMP was obtained and is only 101.  He states he feels at his baseline with dyspnea.  He states he always walks from one piece of furniture to the next with his oxygen.  We discussed admission and he very politely but adamantly declines.  He states he will do as well at home as he would in the hospital.  Was given IV Solu-Medrol.  He took Lasix at home and is urinated several times here.  Plan will be 3-day course of prednisone.  Zithromax Omnicef.  Extra dose Lasix next 2 mornings.  Return if not improving.   Tanna Furry, MD 03/01/18 1300

## 2018-03-01 NOTE — ED Triage Notes (Signed)
SOB with productive cough x 2-3 days, with swelling to feet . On oxygen at 3/L via IXL

## 2018-03-01 NOTE — ED Notes (Signed)
ED Provider at bedside. 

## 2018-03-01 NOTE — Telephone Encounter (Signed)
Agree/noted. 

## 2018-03-01 NOTE — Discharge Instructions (Addendum)
You were seen today for shortness of breath and cough. You were found to have a pneumonia. You are also going through a COPD exacerbation. We have given you 2 separate antibiotics to take. You will also need to take the steroid medication we prescribed starting tomorrow. Use your Albuterol inhaler every 4 hours for the next 24 hours and then as needed.  Take your normal dose of coumadin today and a half dose tomorrow. You will need to follow up with your doctor on Monday so they can check your INR.  Please come back if you have any worsening shortness of breath, chest pain, or fever. Take care!

## 2018-03-01 NOTE — Telephone Encounter (Signed)
Phone call received from patients daughter stating that he was having symptoms of dizziness, weakness and unable to walk. Dr. Anitra Lauth informed and instructed patient to go to ED due to his history and symptoms given.  Daughter was notified and verbalized understanding.

## 2018-03-01 NOTE — ED Provider Notes (Signed)
[facility] EMERGENCY DEPARTMENT Provider Note   CSN: [id number] Arrival date & time: 03/01/18  1042     History   Chief Complaint Chief Complaint  Patient presents with  . Shortness of Breath    HPI [name] is a 82 y.o. male with past medical history of COPD and chronic respiratory failure on 3 L [place], CHF (although no signs of CHF on 06/2017 echo), pulmonary hypertension, CKD stage III, A. fib and mitral valve replacement on chronic anticoagulation, hypertension, here for dyspnea.  Patient notes that over the last year he has had shortness of breath with ambulation however over the last 2 to 3 days it has been worse.  He notes that he is also developed a productive cough with light gray sputum.  He notes that he has had no fevers, chest pain, sore throat, runny nose.  He denies any orthopnea and sleeps on his left side.  He notes that over the last 2 to 3 months he has had lower extremity edema.  He does take a Lasix pill every day however he did miss his Lasix yesterday.  He has had a normal appetite.  His last bowel movement was yesterday, no constipation, nausea, vomiting.  He does follow with the pulmonologist Dr. Lamonte Sakai.   HPI  Past Medical History:  Diagnosis Date  . Adrenal adenoma   . Atrial fibrillation (HCC)    Rate control + Coumadin  . Atrial flutter ([place])   . CAD (coronary artery disease)    Lexiscan Myoview (01/2014): Lateral soft tissue attenuation, no ischemia, not gated, low risk  . Cancer ([place])   . CAP (community acquired pneumonia)    Hospitalized 10/2014  . Chronic diastolic heart failure ([place])   . Chronic hypoxemic respiratory failure (HCC)    from COPD  . Chronic renal insufficiency, stage III (moderate) (HCC)    CrCl 50s  . COPD (chronic obstructive pulmonary disease) (HCC)    oxygen-dependent (2.5 L 24/7).  Improved on starting chronic prednisone 06/2017 (increased to 20 mg qd 08/2017)  Progressive worsening dyspnea at pulm f/u 10/19/17--?  changed to continuous 02 from pulsed delivery (?)--no change in med regimen.  . Diabetes mellitus without complication ([place]) 70/35/0093   A1c 6.5 % 05/2016  . Diverticulosis   . DOE (dyspnea on exertion)    Multifactorial: deconditioning, COPD, obesity hypoventilation syndrome.  Marland Kitchen Dyslipidemia   . Embolic stroke ([place])    d/t endocarditis 2008  . Hemorrhoids   . History of mitral valve replacement    needs SBE prophylaxis  . History of prostate cancer remote past   f/u by Dr. Claris Che; no recurrence as of f/u 10/2015.  PSA rising from 2013 to 06/2016: as of 09/2017 plan is bone scan and CT abd/pelv if PSA gets up around 10.  Marland Kitchen HTN (hypertension)   . Mediastinal lymphadenopathy   . Pulmonary hypertension ([place]) 01/09/2015   secondary  . Right thyroid nodule   . Thrombocytopenia ([place])   . TIA (transient ischemic attack) 03/25/2011   ? of due to transient diplopia  . Ulcerative colitis    left-sided/segmental, associated with diverticulosis.  Sulfasalazine per GI (Dr. Carlean Purl).  . Warthin's tumor     Patient Active Problem List   Diagnosis Date Noted  . Chronic congestive heart failure ([place]) 07/18/2017  . Pruritus 09/02/2015  . Maxillary sinusitis 08/17/2015  . Eye irritation 08/17/2015  . Chronic hypoxemic respiratory failure ([place]) 07/17/2015  . Pulmonary hypertension ([place]) 01/09/2015  . Anxiety  state 11/28/2014  . SOB (shortness of breath) 11/28/2014  . Cerebrovascular disease 11/20/2014  . Emotional lability 11/11/2014  . Tachycardia 11/04/2014  . Encounter for therapeutic drug monitoring 03/18/2014  . Atrial fibrillation ([place]) 07/25/2011  . Long term current use of anticoagulant 10/20/2010  . Essential hypertension, benign 08/20/2009  . HYPERLIPIDEMIA, FAMILIAL 08/03/2009  . Coronary atherosclerosis 08/03/2009  . MITRAL VALVE REPLACEMENT, HX OF 08/03/2009  . THYROID NODULE, RIGHT 12/03/2008  . CEREBROVASCULAR ACCIDENT, HX OF 12/03/2008  . COPD (chronic obstructive pulmonary  disease) with emphysema ([place]) 06/09/2008  . Segmental colitis associated with sigmoid diverticulosis 12/12/2007  . PROSTATE CANCER, HX OF 01/12/2007  . ENDOCARDITIS 07/04/2006    Class: History of    Past Surgical History:  Procedure Laterality Date  . carotid duplex dopplers  04/2014   No signif plaques; repeat 2 yrs per cardiology  . CATARACT EXTRACTION, BILATERAL    . COLONOSCOPY W/ BIOPSIES  12/19/2007   left-sided colitis, diverticulosis, hemorrhoids  . FLEXIBLE SIGMOIDOSCOPY  01/11/2008; 03/19/2010   2009 and 2011:segmental left colitis, diverticulosis, hemorrhoids  . INGUINAL HERNIA REPAIR     right  . MITRAL VALVE REPAIR  09/22/04   and modified Cox-Maze IV   . MITRAL VALVE REPAIR  09/14/05   redo MV repair d/t endocardiits/embolic events  . PROSTATECTOMY  06/2003   Radical  . RIGHT HEART CATHETERIZATION N/A 01/01/2015   Procedure: RIGHT HEART CATH;  Surgeon: Peter M Martinique, MD;  Location: [facility] CATH LAB;  Service: Cardiovascular;  Laterality: N/A;  . SALIVARY GLAND SURGERY     left resection  . TRANSTHORACIC ECHOCARDIOGRAM  11/2013; 07/2017   2015: Mod LVH, EF 55-60%, no WM abnormalities, +LA dilation, +severe RV dilation and impaired systolic fxn, +increased pulm art pressures.  Valves ok.  2018- EF 60-65%, wall motion nl, s/p MV repair, mild RV syst dysf.         Home Medications    Prior to Admission medications   Medication Sig Start Date End Date Taking? Authorizing Provider  albuterol (PROVENTIL HFA;VENTOLIN HFA) 108 (90 Base) MCG/ACT inhaler INHALE ONE PUFF BY MOUTH EVERY 6 HOURS AS NEEDED FOR WHEEZING AND FOR SHORTNESS OF BREATH (USING 5-6 TIMES EVERY 24 HOURS) 01/01/18  Yes Collene Gobble, MD  albuterol (PROVENTIL) (2.5 MG/3ML) 0.083% nebulizer solution Take 3 mLs (2.5 mg total) by nebulization every 4 (four) hours. And as needed 12/29/17  Yes Byrum, Rose Fillers, MD  budesonide (PULMICORT) 0.5 MG/2ML nebulizer solution USE ONE VIAL IN NEBULIZER TWICE DAILY 09/27/17  Yes Byrum,  Rose Fillers, MD  digoxin (LANOXIN) 0.125 MG tablet Take 1 tablet (0.125 mg total) by mouth daily. 01/10/18  Yes Lelon Perla, MD  diltiazem (CARDIZEM CD) 240 MG 24 hr capsule Take 1 capsule (240 mg total) by mouth daily. 01/10/18  Yes Lelon Perla, MD  furosemide (LASIX) 40 MG tablet Take 1 and 1/2 tab po qd 01/10/18  Yes Lelon Perla, MD  pravastatin (PRAVACHOL) 40 MG tablet TAKE 1 TABLET EVERY EVENING 01/10/18  Yes Lelon Perla, MD  STIOLTO RESPIMAT 2.5-2.5 MCG/ACT AERS INHALE 2 PUFFS INTO THE LUNGS DAILY. 09/20/17  Yes Collene Gobble, MD  warfarin (COUMADIN) 5 MG tablet Take 0.5-1 tablets (2.5-5 mg total) by mouth See admin instructions. 5mg  daily except Friday take 2.5mg  AS DIRECTED BY COUMADIN CLINIC Patient taking differently: Take 2.5-5 mg by mouth See admin instructions. 5mg  daily except Friday and Monday take 2.5mg  AS DIRECTED BY COUMADIN CLINIC 01/10/18  Yes Crenshaw,  Denice Bors, MD  azithromycin (ZITHROMAX Z-PAK) 250 MG tablet Take 2 tablets (500 mg) on  Day 1,  followed by 1 tablet (250 mg) once daily on Days 2 through 5. 03/01/18 03/06/18  Carlyle Dolly, MD  cefdinir (OMNICEF) 300 MG capsule Take 1 capsule (300 mg total) by mouth 2 (two) times daily for 10 days. 03/01/18 03/11/18  Carlyle Dolly, MD  predniSONE (DELTASONE) 50 MG tablet Take 1 tablet (50 mg total) by mouth daily with breakfast for 3 days. 03/02/18 03/05/18  Carlyle Dolly, MD    Family History Family History  Problem Relation Age of Onset  . Cancer Mother        spinal  . Stroke Father   . Heart disease Father   . Colon cancer Neg Hx   . Heart attack Neg Hx     Social History Social History   Tobacco Use  . Smoking status: Former Smoker    Packs/day: 3.00    Years: 60.00    Pack years: 180.00    Types: Cigarettes    Last attempt to quit: 09/19/2002    Years since quitting: 15.4  . Smokeless tobacco: Never Used  Substance Use Topics  . Alcohol use: No    Alcohol/week: 0.0 oz  .  Drug use: No     Allergies   Patient has no known allergies.   Review of Systems Review of Systems: per HPI. Otherwise negative   Physical Exam Updated Vital Signs BP (!) 123/59 (BP Location: Right Arm)   Pulse (!) 124   Temp 98.7 F (37.1 C) (Oral)   Resp 20   SpO2 93%   Physical Exam  Constitutional: He is oriented to person, place, and time. He appears well-developed and well-nourished. He does not appear ill. No distress.  HENT:  Mouth/Throat: Oropharynx is clear and moist.  Eyes: Pupils are equal, round, and reactive to light.  Neck: Normal range of motion. Neck supple.  Cardiovascular: Normal rate, regular rhythm, normal heart sounds and intact distal pulses.  Pulmonary/Chest: No accessory muscle usage. Tachypnea noted. No respiratory distress. He has wheezes (diffuse). He has rhonchi (diffuse). He exhibits no tenderness.  Nasal cannula in place  Abdominal: Soft. Bowel sounds are normal. He exhibits distension. He exhibits no mass. There is no tenderness. There is no guarding.  Musculoskeletal:       Right lower leg: He exhibits edema (1+ ankle ).       Left lower leg: He exhibits edema (1+ ankle).  Neurological: He is alert and oriented to person, place, and time. He is not disoriented. No cranial nerve deficit.  Skin: Skin is warm and dry. Capillary refill takes less than 2 seconds. Ecchymosis (on arms) noted. No rash noted.  Psychiatric: He has a normal mood and affect. His behavior is normal.     ED Treatments / Results  Labs (all labs ordered are listed, but only abnormal results are displayed) Labs Reviewed  COMPREHENSIVE METABOLIC PANEL - Abnormal; Notable for the following components:      Result Value   Sodium 134 (*)    Chloride 97 (*)    Glucose, Bld 295 (*)    Creatinine, Ser 1.36 (*)    GFR calc non Af Amer 48 (*)    GFR calc Af Amer 55 (*)    All other components within normal limits  CBC WITH DIFFERENTIAL/PLATELET - Abnormal; Notable for the  following components:   WBC 11.4 (*)    Platelets  133 (*)    Neutro Abs 9.6 (*)    All other components within normal limits  BRAIN NATRIURETIC PEPTIDE - Abnormal; Notable for the following components:   B Natriuretic Peptide 107.1 (*)    All other components within normal limits    EKG EKG Interpretation  Date/Time:  Thursday March 01 2018 10:56:47 EDT Ventricular Rate:  97 PR Interval:    QRS Duration: 115 QT Interval:  361 QTC Calculation: 459 R Axis:   -178 Text Interpretation:  Atrial fibrillation IRBBB and LPFB Lateral infarct, old Confirmed by Tanna Furry [phone]) on 03/01/2018 11:00:01 AM   Radiology Dg Chest 2 View  Result Date: 03/01/2018 CLINICAL DATA:  Short of breath, cough for 2-3 days, some swelling of the feet EXAM: CHEST - 2 VIEW COMPARISON:  Chest x-ray of 10/30/2017 FINDINGS: Prominent markings remain bilaterally some which may be chronic, but edema cannot be excluded. Also there are more prominent markings at the left lung base and pneumonia is a definite consideration. No definite pleural effusion is seen. Mild cardiomegaly is stable with mitral valve replacement and median sternotomy sutures noted. No bony abnormality is seen. IMPRESSION: 1. Prominent markings may indicate an element of interstitial edema. No effusion. 2. Markings at the left lung base are focally prominent and pneumonia is a definite consideration. Electronically Signed   By: Ivar Drape M.D.   On: 03/01/2018 11:37    Procedures Procedures (including critical care time)  Medications Ordered in ED Medications  albuterol (PROVENTIL) (2.5 MG/3ML) 0.083% nebulizer solution 2.5 mg (has no administration in time range)  ipratropium-albuterol (DUONEB) 0.5-2.5 (3) MG/3ML nebulizer solution 3 mL (3 mLs Nebulization Given 03/01/18 1135)  ipratropium-albuterol (DUONEB) 0.5-2.5 (3) MG/3ML nebulizer solution 3 mL (3 mLs Nebulization Given 03/01/18 1228)  methylPREDNISolone sodium succinate (SOLU-MEDROL) 125  mg/2 mL injection 125 mg (125 mg Intravenous Given 03/01/18 1201)  cefTRIAXone (ROCEPHIN) 1 g in sodium chloride 0.9 % 100 mL IVPB (0 g Intravenous Stopped 03/01/18 1326)     Initial Impression / Assessment and Plan / ED Course  I have reviewed the triage vital signs and the nursing notes.  Pertinent labs & imaging results that were available during my care of the patient were reviewed by me and considered in my medical decision making (see chart for details).     82 yo M with PMH of chronic respiratory failure here for dyspnea and productive cough. Patient afebrile here with mild tachypnea on his home O2 of 3 L [place].  He had diffuse rhonchorous and wheezing breath sounds.  Leukocytosis to 11.4 with a lymphocyte predominance.  BNP only mildly elevated to 107.  Chest x-ray performed showing possible left lung base pneumonia.  He was given 2 duo nebs with improvement symptoms and clearing of the rhonchorous breath sounds. Discussed possible admission to hospital, patient states his breathing is now at baseline and he would prefer to be treated for COPD exacerbation and pneumonia at home. Do not feel this is unreasonable given that his respiratory status is now at baseline and he has no signs of sepsis.   1 gram of IV Ceftriaxone given here with IV Solumedrol. He was given a prescription for Omnicef, Azithromycin, and Prednisone for home. Advised to take 1/2 dose of coumadin tomorrow given risks of hypercoagulability with antibiotics. Advised he follow up with PCP on Monday for ED follow up and INR check. He will use his Albuterol inhaler every 4 hours for the next 24 hours. Strict return precautions discussed.  Final Clinical Impressions(s) / ED Diagnoses   Final diagnoses:  Community acquired pneumonia of left lower lobe of lung ([place])  COPD exacerbation [facility])    ED Discharge Orders        Ordered    predniSONE (DELTASONE) 50 MG tablet  Daily with breakfast     03/01/18 1324    cefdinir  (OMNICEF) 300 MG capsule  2 times daily     03/01/18 1324    azithromycin (ZITHROMAX Z-PAK) 250 MG tablet     03/01/18 1324       Carlyle Dolly, MD 03/01/18 1433    Tanna Furry, MD 03/03/18 [phone]

## 2018-03-04 ENCOUNTER — Encounter (HOSPITAL_COMMUNITY): Payer: Self-pay | Admitting: Nurse Practitioner

## 2018-03-04 ENCOUNTER — Inpatient Hospital Stay (HOSPITAL_COMMUNITY)
Admission: EM | Admit: 2018-03-04 | Discharge: 2018-03-07 | DRG: 193 | Disposition: A | Discharge status: Home Health | Payer: Medicare Other | Attending: Internal Medicine | Admitting: Internal Medicine

## 2018-03-04 ENCOUNTER — Emergency Department (HOSPITAL_COMMUNITY): Payer: Medicare Other

## 2018-03-04 ENCOUNTER — Other Ambulatory Visit: Payer: Self-pay

## 2018-03-04 DIAGNOSIS — Z9889 Other specified postprocedural states: Secondary | ICD-10-CM | POA: Diagnosis not present

## 2018-03-04 DIAGNOSIS — I10 Essential (primary) hypertension: Secondary | ICD-10-CM | POA: Diagnosis present

## 2018-03-04 DIAGNOSIS — R739 Hyperglycemia, unspecified: Secondary | ICD-10-CM

## 2018-03-04 DIAGNOSIS — Z7952 Long term (current) use of systemic steroids: Secondary | ICD-10-CM | POA: Diagnosis not present

## 2018-03-04 DIAGNOSIS — J439 Emphysema, unspecified: Secondary | ICD-10-CM | POA: Diagnosis not present

## 2018-03-04 DIAGNOSIS — R0602 Shortness of breath: Secondary | ICD-10-CM | POA: Diagnosis not present

## 2018-03-04 DIAGNOSIS — Z79899 Other long term (current) drug therapy: Secondary | ICD-10-CM

## 2018-03-04 DIAGNOSIS — J181 Lobar pneumonia, unspecified organism: Secondary | ICD-10-CM

## 2018-03-04 DIAGNOSIS — T380X5A Adverse effect of glucocorticoids and synthetic analogues, initial encounter: Secondary | ICD-10-CM | POA: Diagnosis present

## 2018-03-04 DIAGNOSIS — E785 Hyperlipidemia, unspecified: Secondary | ICD-10-CM | POA: Diagnosis present

## 2018-03-04 DIAGNOSIS — I272 Pulmonary hypertension, unspecified: Secondary | ICD-10-CM | POA: Diagnosis present

## 2018-03-04 DIAGNOSIS — J1289 Other viral pneumonia: Secondary | ICD-10-CM | POA: Diagnosis present

## 2018-03-04 DIAGNOSIS — I4891 Unspecified atrial fibrillation: Secondary | ICD-10-CM | POA: Diagnosis present

## 2018-03-04 DIAGNOSIS — I5032 Chronic diastolic (congestive) heart failure: Secondary | ICD-10-CM | POA: Diagnosis not present

## 2018-03-04 DIAGNOSIS — E1122 Type 2 diabetes mellitus with diabetic chronic kidney disease: Secondary | ICD-10-CM | POA: Diagnosis present

## 2018-03-04 DIAGNOSIS — E1165 Type 2 diabetes mellitus with hyperglycemia: Secondary | ICD-10-CM | POA: Diagnosis present

## 2018-03-04 DIAGNOSIS — J9621 Acute and chronic respiratory failure with hypoxia: Secondary | ICD-10-CM | POA: Diagnosis not present

## 2018-03-04 DIAGNOSIS — I482 Chronic atrial fibrillation: Secondary | ICD-10-CM | POA: Diagnosis not present

## 2018-03-04 DIAGNOSIS — I5033 Acute on chronic diastolic (congestive) heart failure: Secondary | ICD-10-CM | POA: Diagnosis present

## 2018-03-04 DIAGNOSIS — Z87891 Personal history of nicotine dependence: Secondary | ICD-10-CM

## 2018-03-04 DIAGNOSIS — I130 Hypertensive heart and chronic kidney disease with heart failure and stage 1 through stage 4 chronic kidney disease, or unspecified chronic kidney disease: Secondary | ICD-10-CM | POA: Diagnosis present

## 2018-03-04 DIAGNOSIS — Z8673 Personal history of transient ischemic attack (TIA), and cerebral infarction without residual deficits: Secondary | ICD-10-CM

## 2018-03-04 DIAGNOSIS — Z7901 Long term (current) use of anticoagulants: Secondary | ICD-10-CM

## 2018-03-04 DIAGNOSIS — N179 Acute kidney failure, unspecified: Secondary | ICD-10-CM | POA: Diagnosis present

## 2018-03-04 DIAGNOSIS — B9789 Other viral agents as the cause of diseases classified elsewhere: Secondary | ICD-10-CM | POA: Diagnosis present

## 2018-03-04 DIAGNOSIS — Z952 Presence of prosthetic heart valve: Secondary | ICD-10-CM

## 2018-03-04 DIAGNOSIS — N183 Chronic kidney disease, stage 3 (moderate): Secondary | ICD-10-CM | POA: Diagnosis present

## 2018-03-04 DIAGNOSIS — J9811 Atelectasis: Secondary | ICD-10-CM | POA: Diagnosis present

## 2018-03-04 DIAGNOSIS — I2510 Atherosclerotic heart disease of native coronary artery without angina pectoris: Secondary | ICD-10-CM | POA: Diagnosis present

## 2018-03-04 DIAGNOSIS — I509 Heart failure, unspecified: Secondary | ICD-10-CM | POA: Diagnosis not present

## 2018-03-04 DIAGNOSIS — E119 Type 2 diabetes mellitus without complications: Secondary | ICD-10-CM

## 2018-03-04 DIAGNOSIS — J189 Pneumonia, unspecified organism: Secondary | ICD-10-CM

## 2018-03-04 DIAGNOSIS — R0902 Hypoxemia: Secondary | ICD-10-CM

## 2018-03-04 DIAGNOSIS — Z7951 Long term (current) use of inhaled steroids: Secondary | ICD-10-CM | POA: Diagnosis not present

## 2018-03-04 DIAGNOSIS — Z9981 Dependence on supplemental oxygen: Secondary | ICD-10-CM | POA: Diagnosis not present

## 2018-03-04 DIAGNOSIS — I351 Nonrheumatic aortic (valve) insufficiency: Secondary | ICD-10-CM | POA: Diagnosis not present

## 2018-03-04 LAB — CBC WITH DIFFERENTIAL/PLATELET
BASOS ABS: 0 10*3/uL (ref 0.0–0.1)
BASOS PCT: 0 %
EOS ABS: 0 10*3/uL (ref 0.0–0.7)
EOS PCT: 0 %
HCT: 42.3 % (ref 39.0–52.0)
HEMOGLOBIN: 13.6 g/dL (ref 13.0–17.0)
Lymphocytes Relative: 7 %
Lymphs Abs: 0.7 10*3/uL (ref 0.7–4.0)
MCH: 29.4 pg (ref 26.0–34.0)
MCHC: 32.2 g/dL (ref 30.0–36.0)
MCV: 91.4 fL (ref 78.0–100.0)
Monocytes Absolute: 0.6 10*3/uL (ref 0.1–1.0)
Monocytes Relative: 6 %
NEUTROS PCT: 87 %
Neutro Abs: 8.8 10*3/uL — ABNORMAL HIGH (ref 1.7–7.7)
PLATELETS: 170 10*3/uL (ref 150–400)
RBC: 4.63 MIL/uL (ref 4.22–5.81)
RDW: 15.2 % (ref 11.5–15.5)
WBC: 10.2 10*3/uL (ref 4.0–10.5)

## 2018-03-04 LAB — BRAIN NATRIURETIC PEPTIDE: B NATRIURETIC PEPTIDE 5: 144.2 pg/mL — AB (ref 0.0–100.0)

## 2018-03-04 LAB — BASIC METABOLIC PANEL
Anion gap: 16 — ABNORMAL HIGH (ref 5–15)
BUN: 32 mg/dL — AB (ref 6–20)
CALCIUM: 9.1 mg/dL (ref 8.9–10.3)
CHLORIDE: 95 mmol/L — AB (ref 101–111)
CO2: 23 mmol/L (ref 22–32)
CREATININE: 1.7 mg/dL — AB (ref 0.61–1.24)
GFR calc non Af Amer: 36 mL/min — ABNORMAL LOW (ref >60)
GFR, EST AFRICAN AMERICAN: 42 mL/min — AB (ref >60)
Glucose, Bld: 556 mg/dL (ref 65–99)
Potassium: 4.5 mmol/L (ref 3.5–5.1)
SODIUM: 134 mmol/L — AB (ref 135–145)

## 2018-03-04 LAB — CBG MONITORING, ED: Glucose-Capillary: 426 mg/dL — ABNORMAL HIGH (ref 65–99)

## 2018-03-04 MED ORDER — UMECLIDINIUM BROMIDE 62.5 MCG/INH IN AEPB
1.0000 | INHALATION_SPRAY | Freq: Every day | RESPIRATORY_TRACT | Status: DC
  Administered 2018-03-05 – 2018-03-07 (×3): 1 via RESPIRATORY_TRACT
  Filled 2018-03-04: qty 7

## 2018-03-04 MED ORDER — BUDESONIDE 0.5 MG/2ML IN SUSP
0.5000 mg | Freq: Two times a day (BID) | RESPIRATORY_TRACT | Status: DC
  Administered 2018-03-04 – 2018-03-07 (×6): 0.5 mg via RESPIRATORY_TRACT
  Filled 2018-03-04 (×5): qty 2

## 2018-03-04 MED ORDER — SODIUM CHLORIDE 0.9 % IV SOLN
100.0000 mg | Freq: Two times a day (BID) | INTRAVENOUS | Status: DC
  Administered 2018-03-04 – 2018-03-07 (×6): 100 mg via INTRAVENOUS
  Filled 2018-03-04 (×6): qty 100

## 2018-03-04 MED ORDER — PREDNISONE 20 MG PO TABS: 20.0000 mg | ORAL_TABLET | Freq: Every day | ORAL | Status: DC

## 2018-03-04 MED ORDER — SODIUM CHLORIDE 0.9 % IV SOLN
500.0000 mg | Freq: Once | INTRAVENOUS | Status: DC
  Filled 2018-03-04: qty 500

## 2018-03-04 MED ORDER — INSULIN ASPART 100 UNIT/ML ~~LOC~~ SOLN
10.0000 [IU] | Freq: Once | SUBCUTANEOUS | Status: AC
  Administered 2018-03-04: 10 [IU] via INTRAVENOUS
  Filled 2018-03-04: qty 1

## 2018-03-04 MED ORDER — SODIUM CHLORIDE 0.9 % IV SOLN
1.0000 g | Freq: Once | INTRAVENOUS | Status: AC
  Administered 2018-03-04: 1 g via INTRAVENOUS
  Filled 2018-03-04: qty 10

## 2018-03-04 MED ORDER — PRAVASTATIN SODIUM 20 MG PO TABS
40.0000 mg | ORAL_TABLET | Freq: Every day | ORAL | Status: DC
  Administered 2018-03-05 – 2018-03-06 (×2): 40 mg via ORAL
  Filled 2018-03-04 (×2): qty 2

## 2018-03-04 MED ORDER — DIGOXIN 125 MCG PO TABS
0.1250 mg | ORAL_TABLET | Freq: Every day | ORAL | Status: DC
  Administered 2018-03-05 – 2018-03-07 (×3): 0.125 mg via ORAL
  Filled 2018-03-04 (×3): qty 1

## 2018-03-04 MED ORDER — PREDNISONE 20 MG PO TABS
40.0000 mg | ORAL_TABLET | Freq: Every day | ORAL | Status: DC
  Administered 2018-03-05 – 2018-03-07 (×3): 40 mg via ORAL
  Filled 2018-03-04 (×3): qty 2

## 2018-03-04 MED ORDER — ARFORMOTEROL TARTRATE 15 MCG/2ML IN NEBU
15.0000 ug | INHALATION_SOLUTION | Freq: Two times a day (BID) | RESPIRATORY_TRACT | Status: DC
  Administered 2018-03-05 – 2018-03-07 (×5): 15 ug via RESPIRATORY_TRACT
  Filled 2018-03-04 (×5): qty 2

## 2018-03-04 MED ORDER — INSULIN ASPART 100 UNIT/ML ~~LOC~~ SOLN
0.0000 [IU] | Freq: Three times a day (TID) | SUBCUTANEOUS | Status: DC
  Administered 2018-03-05 (×2): 5 [IU] via SUBCUTANEOUS
  Administered 2018-03-05: 8 [IU] via SUBCUTANEOUS
  Administered 2018-03-06: 15 [IU] via SUBCUTANEOUS
  Administered 2018-03-06 – 2018-03-07 (×3): 3 [IU] via SUBCUTANEOUS
  Administered 2018-03-07: 5 [IU] via SUBCUTANEOUS

## 2018-03-04 MED ORDER — DILTIAZEM HCL ER COATED BEADS 240 MG PO CP24
240.0000 mg | ORAL_CAPSULE | Freq: Every day | ORAL | Status: DC
  Administered 2018-03-05 – 2018-03-07 (×3): 240 mg via ORAL
  Filled 2018-03-04 (×3): qty 1

## 2018-03-04 MED ORDER — IOPAMIDOL (ISOVUE-300) INJECTION 61%
100.0000 mL | Freq: Once | INTRAVENOUS | Status: AC | PRN
  Administered 2018-03-04: 80 mL via INTRAVENOUS

## 2018-03-04 MED ORDER — ALBUTEROL SULFATE (2.5 MG/3ML) 0.083% IN NEBU
2.5000 mg | INHALATION_SOLUTION | RESPIRATORY_TRACT | Status: DC
  Administered 2018-03-04 – 2018-03-05 (×5): 2.5 mg via RESPIRATORY_TRACT
  Filled 2018-03-04 (×5): qty 3

## 2018-03-04 MED ORDER — LORAZEPAM 0.5 MG PO TABS
0.5000 mg | ORAL_TABLET | ORAL | Status: DC | PRN
  Administered 2018-03-05 – 2018-03-07 (×4): 0.5 mg via ORAL
  Filled 2018-03-04 (×4): qty 1

## 2018-03-04 NOTE — ED Notes (Addendum)
Per pt and family request, CBG rechecked before administering insulin. States his blood sugar is never over 100 and doesn't believe that could be right. After rechecking, family and pt consented to insulin administration.

## 2018-03-04 NOTE — H&P (Signed)
History and Physical    NICOLES SEDLACEK YTK:[id number] DOB: 10-17-1935 DOA: 03/04/2018  PCP: Tammi Sou, MD  Patient coming from: Home  I have personally briefly reviewed patient's old medical records in [place]  Chief Complaint: SOB  HPI: RAYBURN MUNDIS is a 82 y.o. male with medical history significant of COPD steroid and 3L O2 dependent at baseline, CAD, CHF, MVR, A.Fib on coumadin.  HTN, DM2.  Patient recently diagnosed with LLL CAP, started on outpt treatment including omnicef, zpak, and steroid dose increased x3 days ago.  Despite taking treatment symptoms / SOB has persisted and he ran out of increased steroid dose today.  He presents to ED with SOB despite using his usual 3L [place].  Wheezing is better but SOB persists.  Productive cough noted.  BLE edema is about baseline per him and per wife.   ED Course: CT shows LLL PNA.  BNP 144 up from 91 x3 days ago.  Hyperglycemic with BGL in the 500s.  AKI with creat 1.7 up from 1.3 x3 days ago.  Has increased O2 requirement.   Review of Systems: As per HPI otherwise 10 point review of systems negative.   Past Medical History:  Diagnosis Date  . Adrenal adenoma   . Atrial fibrillation (HCC)    Rate control + Coumadin  . Atrial flutter ([place])   . CAD (coronary artery disease)    Lexiscan Myoview (01/2014): Lateral soft tissue attenuation, no ischemia, not gated, low risk  . Cancer ([place])   . CAP (community acquired pneumonia)    Hospitalized 10/2014  . Chronic diastolic heart failure ([place])   . Chronic hypoxemic respiratory failure (HCC)    from COPD  . Chronic renal insufficiency, stage III (moderate) (HCC)    CrCl 50s  . COPD (chronic obstructive pulmonary disease) (HCC)    oxygen-dependent (2.5 L 24/7).  Improved on starting chronic prednisone 06/2017 (increased to 20 mg qd 08/2017)  Progressive worsening dyspnea at pulm f/u 10/19/17--? changed to continuous 02 from pulsed delivery (?)--no change in med regimen.  .  Diabetes mellitus without complication ([place]) 75/17/0017   A1c 6.5 % 05/2016  . Diverticulosis   . DOE (dyspnea on exertion)    Multifactorial: deconditioning, COPD, obesity hypoventilation syndrome.  Marland Kitchen Dyslipidemia   . Embolic stroke ([place])    d/t endocarditis 2008  . Hemorrhoids   . History of mitral valve replacement    needs SBE prophylaxis  . History of prostate cancer remote past   f/u by Dr. Claris Che; no recurrence as of f/u 10/2015.  PSA rising from 2013 to 06/2016: as of 09/2017 plan is bone scan and CT abd/pelv if PSA gets up around 10.  Marland Kitchen HTN (hypertension)   . Mediastinal lymphadenopathy   . Pulmonary hypertension ([place]) 01/09/2015   secondary  . Right thyroid nodule   . Thrombocytopenia ([place])   . TIA (transient ischemic attack) 03/25/2011   ? of due to transient diplopia  . Ulcerative colitis    left-sided/segmental, associated with diverticulosis.  Sulfasalazine per GI (Dr. Carlean Purl).  . Warthin's tumor     Past Surgical History:  Procedure Laterality Date  . carotid duplex dopplers  04/2014   No signif plaques; repeat 2 yrs per cardiology  . CATARACT EXTRACTION, BILATERAL    . COLONOSCOPY W/ BIOPSIES  12/19/2007   left-sided colitis, diverticulosis, hemorrhoids  . FLEXIBLE SIGMOIDOSCOPY  01/11/2008; 03/19/2010   2009 and 2011:segmental left colitis, diverticulosis, hemorrhoids  . INGUINAL HERNIA REPAIR  right  . MITRAL VALVE REPAIR  09/22/04   and modified Cox-Maze IV   . MITRAL VALVE REPAIR  09/14/05   redo MV repair d/t endocardiits/embolic events  . PROSTATECTOMY  06/2003   Radical  . RIGHT HEART CATHETERIZATION N/A 01/01/2015   Procedure: RIGHT HEART CATH;  Surgeon: Peter M Martinique, MD;  Location: [facility] CATH LAB;  Service: Cardiovascular;  Laterality: N/A;  . SALIVARY GLAND SURGERY     left resection  . TRANSTHORACIC ECHOCARDIOGRAM  11/2013; 07/2017   2015: Mod LVH, EF 55-60%, no WM abnormalities, +LA dilation, +severe RV dilation and impaired systolic fxn, +increased  pulm art pressures.  Valves ok.  2018- EF 60-65%, wall motion nl, s/p MV repair, mild RV syst dysf.      reports that he quit smoking about 15 years ago. His smoking use included cigarettes. He has a 180.00 pack-year smoking history. He has never used smokeless tobacco. He reports that he does not drink alcohol or use drugs.  No Known Allergies  Family History  Problem Relation Age of Onset  . Cancer Mother        spinal  . Stroke Father   . Heart disease Father   . Colon cancer Neg Hx   . Heart attack Neg Hx      Prior to Admission medications   Medication Sig Start Date End Date Taking? Authorizing Provider  albuterol (PROVENTIL HFA;VENTOLIN HFA) 108 (90 Base) MCG/ACT inhaler INHALE ONE PUFF BY MOUTH EVERY 6 HOURS AS NEEDED FOR WHEEZING AND FOR SHORTNESS OF BREATH (USING 5-6 TIMES EVERY 24 HOURS) 01/01/18  Yes Collene Gobble, MD  albuterol (PROVENTIL) (2.5 MG/3ML) 0.083% nebulizer solution Take 3 mLs (2.5 mg total) by nebulization every 4 (four) hours. And as needed 12/29/17  Yes Byrum, Rose Fillers, MD  budesonide (PULMICORT) 0.5 MG/2ML nebulizer solution USE ONE VIAL IN NEBULIZER TWICE DAILY 09/27/17  Yes Byrum, Rose Fillers, MD  digoxin (LANOXIN) 0.125 MG tablet Take 1 tablet (0.125 mg total) by mouth daily. 01/10/18  Yes Lelon Perla, MD  diltiazem (CARDIZEM CD) 240 MG 24 hr capsule Take 1 capsule (240 mg total) by mouth daily. 01/10/18  Yes Lelon Perla, MD  furosemide (LASIX) 40 MG tablet Take 1 and 1/2 tab po qd Patient taking differently: Take 40 mg by mouth daily.  01/10/18  Yes Lelon Perla, MD  LORazepam (ATIVAN) 0.5 MG tablet Take 0.5 mg by mouth as needed for anxiety.   Yes [provider]  pravastatin (PRAVACHOL) 40 MG tablet TAKE 1 TABLET EVERY EVENING Patient taking differently: Take 40 mg by mouth daily. TAKE 1 TABLET EVERY EVENING 01/10/18  Yes Crenshaw, Denice Bors, MD  predniSONE (DELTASONE) 20 MG tablet Take 20 mg by mouth daily with breakfast.   Yes  [provider]  [place] 2.5-2.5 MCG/ACT AERS INHALE 2 PUFFS INTO THE LUNGS DAILY. 09/20/17  Yes Collene Gobble, MD  warfarin (COUMADIN) 5 MG tablet Take 0.5-1 tablets (2.5-5 mg total) by mouth See admin instructions. 5mg  daily except Friday take 2.5mg  AS DIRECTED BY COUMADIN CLINIC Patient taking differently: Take 2.5-5 mg by mouth daily.  01/10/18  Yes Lelon Perla, MD    Physical Exam: Vitals:   03/04/18 2000 03/04/18 2124 03/04/18 2137 03/04/18 2153  BP: (!) 154/69 (!) 145/83  (!) 152/84  Pulse: 69 83  91  Resp: (!) 27 (!) 25  (!) 28  Temp:    97.6 F (36.4 C)  TempSrc:  Oral  SpO2: 95% 95%  93%  Weight:   86.2 kg (190 lb)   Height:   5\' 8"  (1.727 m)     Constitutional: NAD, calm, comfortable Eyes: PERRL, lids and conjunctivae normal ENMT: Mucous membranes are moist. Posterior pharynx clear of any exudate or lesions.Normal dentition.  Neck: normal, supple, no masses, no thyromegaly Respiratory: Few diffuse wheezes.  B rhonchi, 1+ BLE pitting edema Cardiovascular: Irr, irr Abdomen: no tenderness, no masses palpated. No hepatosplenomegaly. Bowel sounds positive.  Musculoskeletal: no clubbing / cyanosis. No joint deformity upper and lower extremities. Good ROM, no contractures. Normal muscle tone.  Skin: no rashes, lesions, ulcers. No induration Neurologic: CN 2-12 grossly intact. Sensation intact, DTR normal. Strength 5/5 in all 4.  Psychiatric: Normal judgment and insight. Alert and oriented x 3. Normal mood.    Labs on Admission: I have personally reviewed following labs and imaging studies  CBC: Recent Labs  Lab 03/01/18 1125 03/04/18 1803  WBC 11.4* 10.2  NEUTROABS 9.6* 8.8*  HGB 14.0 13.6  HCT 41.9 42.3  MCV 87.5 91.4  PLT 133* 423   Basic Metabolic Panel: Recent Labs  Lab 03/01/18 1125 03/04/18 1803  NA 134* 134*  K 3.7 4.5  CL 97* 95*  CO2 26 23  GLUCOSE 295* 556*  BUN 17 32*  CREATININE 1.36* 1.70*  CALCIUM 8.9 9.1    GFR: Estimated Creatinine Clearance: 37 mL/min (A) (by C-G formula based on SCr of 1.7 mg/dL (H)). Liver Function Tests: Recent Labs  Lab 03/01/18 1125  AST 20  ALT 18  ALKPHOS 68  BILITOT 1.1  PROT 6.8  ALBUMIN 3.7   No results for input(s): LIPASE, AMYLASE in the last 168 hours. No results for input(s): AMMONIA in the last 168 hours. Coagulation Profile: Recent Labs  Lab 02/27/18 0920  INR 3.1*   Cardiac Enzymes: No results for input(s): CKTOTAL, CKMB, CKMBINDEX, TROPONINI in the last 168 hours. BNP (last 3 results) No results for input(s): PROBNP in the last 8760 hours. HbA1C: No results for input(s): HGBA1C in the last 72 hours. CBG: Recent Labs  Lab 03/04/18 1944  GLUCAP 426*   Lipid Profile: No results for input(s): CHOL, HDL, LDLCALC, TRIG, CHOLHDL, LDLDIRECT in the last 72 hours. Thyroid Function Tests: No results for input(s): TSH, T4TOTAL, FREET4, T3FREE, THYROIDAB in the last 72 hours. Anemia Panel: No results for input(s): VITAMINB12, FOLATE, FERRITIN, TIBC, IRON, RETICCTPCT in the last 72 hours. Urine analysis:    Component Value Date/Time   COLORURINE YELLOW 11/06/2014 [place] 11/06/2014 0851   LABSPEC 1.017 11/06/2014 0851   PHURINE 5.0 11/06/2014 0851   GLUCOSEU NEGATIVE 11/06/2014 0851   HGBUR SMALL (A) 11/06/2014 0851   BILIRUBINUR small 11/27/2014 1025   KETONESUR NEGATIVE 11/06/2014 0851   PROTEINUR 100 11/27/2014 1025   PROTEINUR NEGATIVE 11/06/2014 0851   UROBILINOGEN 4.0 11/27/2014 1025   UROBILINOGEN 0.2 11/06/2014 0851   NITRITE negative 11/27/2014 1025   NITRITE NEGATIVE 11/06/2014 0851   LEUKOCYTESUR Negative 11/27/2014 1025    Radiological Exams on Admission: Ct Chest W Contrast  Result Date: 03/04/2018 CLINICAL DATA:  Short of breath EXAM: CT CHEST WITH CONTRAST TECHNIQUE: Multidetector CT imaging of the chest was performed during intravenous contrast administration. CONTRAST:  38mL ISOVUE-300 IOPAMIDOL  (ISOVUE-300) INJECTION 61% COMPARISON:  Chest x-ray 03/04/2018, CT chest 12/14/2009 FINDINGS: Cardiovascular: Nonaneurysmal aorta. Moderate aortic atherosclerosis. Coronary vascular calcification. Borderline heart size. No significant pericardial effusion. Mitral valve prosthesis. Mediastinum/Nodes: Midline trachea. No thyroid  mass. Stable scattered mediastinal lymph nodes. Largest is seen in the subcarinal region and measures 14 mm. Esophagus within normal limits Lungs/Pleura: Moderate emphysema. Decreased size of right upper lobe pulmonary nodule now measuring 4 mm compared to 6 mm prior. Interim finding of slightly spiculated nodular foci of airspace disease in the left lower lobe, measuring 14 mm posteriorly and 15 mm anteriorly. No pleural effusion Upper Abdomen: Stable to decreased size of left adrenal nodule/adenoma now measuring 19 mm. No acute abnormality in the upper abdomen. Musculoskeletal: Post sternotomy changes. No acute or suspicious bony abnormality. IMPRESSION: 1. Irregular and somewhat nodular/spiculated foci of airspace disease in the left lower lobe, possibly due to pneumonia, however lung neoplasm is also a concern. Short interval CT follow-up in 4-6 weeks following antibiotic trial is suggested. 2. Moderate emphysema 3. Decreased size of left adrenal gland adenoma. Aortic Atherosclerosis (ICD10-I70.0) and Emphysema (ICD10-J43.9). Electronically Signed   By: Donavan Foil M.D.   On: 03/04/2018 21:06   Dg Chest Port 1 View  Result Date: 03/04/2018 CLINICAL DATA:  Shortness of breath. EXAM: PORTABLE CHEST 1 VIEW COMPARISON:  03/01/2018 FINDINGS: Cardiac enlargement with aortic atherosclerosis noted. No pleural effusions identified. Pulmonary vascular congestion and bibasilar atelectasis noted. Asymmetric opacity in the left base may represent pneumonia. IMPRESSION: 1. Bibasilar atelectasis with asymmetric left base opacity which may represent pneumonia. 2. Cardiac enlargement with aortic  atherosclerosis. Aortic Atherosclerosis (ICD10-I70.0). 3. Pulmonary vascular congestion. Electronically Signed   By: Kerby Moors M.D.   On: 03/04/2018 18:20    EKG: Independently reviewed.  Assessment/Plan Principal Problem:   Community acquired pneumonia of left lower lobe of lung ([place]) Active Problems:   Essential hypertension, benign   COPD (chronic obstructive pulmonary disease) with emphysema (HCC)   MITRAL VALVE REPLACEMENT, HX OF   Atrial fibrillation (HCC)   Acute on chronic respiratory failure with hypoxia (HCC)   Chronic congestive heart failure (HCC)   AKI (acute kidney injury) ([place])   DM2 (diabetes mellitus, type 2) ([place])    1. LLL CAP - causing COPD exacerbation and increased O2 requirement 1. PNA pathway 2. Cultures pending 3. Will do rocephin / doxy for now since seems to be failing outpt omnicef / azithro. 1. Levaquin is a thought, but would like to try and avoid the fluroquinolone in patient with AKI and on coumadin. 2. If no improvement, then will need to switch / escalate ABx. 2. COPD exacerbation - 1. Cont home nebs 2. Adult wheeze protocol 3. Wheezing improved on prednisone 4. Will leave him on prednisone 40mg  PO daily increased dose for now 1. Takes 20mg  po daily at baseline 3. AKI - 1. Possibly slightly pre-renal? 2. Hold lasix to "gently hydrate" 3. Repeat BMP in AM 4. Chronic diastolic CHF - 1. Holding lasix as above 2. BNP 144 3. But no evidence of acute CHF being called on CT scan 4. Peripheral edema is about baseline (despite prednisone use). 5. Watch for CHF worsening with holding lasix. 6. Tele monitor 5. H/o MVR 1. Will get 2d echo, last done in Oct 6. DM2 - 1. Hyperglycemic due to steroids 2. Mod scale SSI AC 7. A.Fib - 1. Cont cardizem and digoxin 2. Cont coumadin  DVT prophylaxis: Coumadin Code Status: Full Family Communication: Family at bedside Disposition Plan: Home after admit Consults called: None Admission status:  Admit to inpatient   Etta Quill DO [facility] Pager [phone] Only works nights!  If 7AM-7PM, please contact the primary day team physician taking care  of patient  [email or web address] Password [facility]  03/04/2018, 10:39 PM

## 2018-03-04 NOTE — ED Provider Notes (Signed)
[facility] DEPT Provider Note   CSN: [id number] Arrival date & time: 03/04/18  1738     History   Chief Complaint Chief Complaint  Patient presents with  . Respiratory Distress    HPI [name] is a 82 y.o. male.  Patient with shortness of breath.  Patient seen at med center [facility] on Thursday.  Diagnosis of pneumonia.  Started on oral antibiotics.  Patient also started with increase in steroids.  Patient has a history of atrial fibrillation.  Patient is on anticoagulation therapy.  Patient's on Coumadin.  INR was therapeutic on Thursday.  Patient came in today with worsening shortness of breath.  Normally he is on 3 L of oxygen.  He was hypoxic on his 3 L.  Denied any wheezing.  Also did admit to bilateral leg swelling.  Patient denies any chest pain.     Past Medical History:  Diagnosis Date  . Adrenal adenoma   . Atrial fibrillation (HCC)    Rate control + Coumadin  . Atrial flutter ([place])   . CAD (coronary artery disease)    Lexiscan Myoview (01/2014): Lateral soft tissue attenuation, no ischemia, not gated, low risk  . Cancer ([place])   . CAP (community acquired pneumonia)    Hospitalized 10/2014  . Chronic diastolic heart failure ([place])   . Chronic hypoxemic respiratory failure (HCC)    from COPD  . Chronic renal insufficiency, stage III (moderate) (HCC)    CrCl 50s  . COPD (chronic obstructive pulmonary disease) (HCC)    oxygen-dependent (2.5 L 24/7).  Improved on starting chronic prednisone 06/2017 (increased to 20 mg qd 08/2017)  Progressive worsening dyspnea at pulm f/u 10/19/17--? changed to continuous 02 from pulsed delivery (?)--no change in med regimen.  . Diabetes mellitus without complication ([place]) 57/32/2025   A1c 6.5 % 05/2016  . Diverticulosis   . DOE (dyspnea on exertion)    Multifactorial: deconditioning, COPD, obesity hypoventilation syndrome.  Marland Kitchen Dyslipidemia   . Embolic stroke ([place])    d/t endocarditis 2008  .  Hemorrhoids   . History of mitral valve replacement    needs SBE prophylaxis  . History of prostate cancer remote past   f/u by Dr. Claris Che; no recurrence as of f/u 10/2015.  PSA rising from 2013 to 06/2016: as of 09/2017 plan is bone scan and CT abd/pelv if PSA gets up around 10.  Marland Kitchen HTN (hypertension)   . Mediastinal lymphadenopathy   . Pulmonary hypertension ([place]) 01/09/2015   secondary  . Right thyroid nodule   . Thrombocytopenia ([place])   . TIA (transient ischemic attack) 03/25/2011   ? of due to transient diplopia  . Ulcerative colitis    left-sided/segmental, associated with diverticulosis.  Sulfasalazine per GI (Dr. Carlean Purl).  . Warthin's tumor     Patient Active Problem List   Diagnosis Date Noted  . Chronic congestive heart failure ([place]) 07/18/2017  . Pruritus 09/02/2015  . Maxillary sinusitis 08/17/2015  . Eye irritation 08/17/2015  . Chronic hypoxemic respiratory failure ([place]) 07/17/2015  . Pulmonary hypertension ([place]) 01/09/2015  . Anxiety state 11/28/2014  . SOB (shortness of breath) 11/28/2014  . Cerebrovascular disease 11/20/2014  . Emotional lability 11/11/2014  . Tachycardia 11/04/2014  . Encounter for therapeutic drug monitoring 03/18/2014  . Atrial fibrillation ([place]) 07/25/2011  . Long term current use of anticoagulant 10/20/2010  . Essential hypertension, benign 08/20/2009  . HYPERLIPIDEMIA, FAMILIAL 08/03/2009  . Coronary atherosclerosis 08/03/2009  . MITRAL VALVE REPLACEMENT, HX OF  08/03/2009  . THYROID NODULE, RIGHT 12/03/2008  . CEREBROVASCULAR ACCIDENT, HX OF 12/03/2008  . COPD (chronic obstructive pulmonary disease) with emphysema ([place]) 06/09/2008  . Segmental colitis associated with sigmoid diverticulosis 12/12/2007  . PROSTATE CANCER, HX OF 01/12/2007  . ENDOCARDITIS 07/04/2006    Class: History of    Past Surgical History:  Procedure Laterality Date  . carotid duplex dopplers  04/2014   No signif plaques; repeat 2 yrs per cardiology  . CATARACT  EXTRACTION, BILATERAL    . COLONOSCOPY W/ BIOPSIES  12/19/2007   left-sided colitis, diverticulosis, hemorrhoids  . FLEXIBLE SIGMOIDOSCOPY  01/11/2008; 03/19/2010   2009 and 2011:segmental left colitis, diverticulosis, hemorrhoids  . INGUINAL HERNIA REPAIR     right  . MITRAL VALVE REPAIR  09/22/04   and modified Cox-Maze IV   . MITRAL VALVE REPAIR  09/14/05   redo MV repair d/t endocardiits/embolic events  . PROSTATECTOMY  06/2003   Radical  . RIGHT HEART CATHETERIZATION N/A 01/01/2015   Procedure: RIGHT HEART CATH;  Surgeon: Peter M Martinique, MD;  Location: [facility] CATH LAB;  Service: Cardiovascular;  Laterality: N/A;  . SALIVARY GLAND SURGERY     left resection  . TRANSTHORACIC ECHOCARDIOGRAM  11/2013; 07/2017   2015: Mod LVH, EF 55-60%, no WM abnormalities, +LA dilation, +severe RV dilation and impaired systolic fxn, +increased pulm art pressures.  Valves ok.  2018- EF 60-65%, wall motion nl, s/p MV repair, mild RV syst dysf.         Home Medications    Prior to Admission medications   Medication Sig Start Date End Date Taking? Authorizing Provider  albuterol (PROVENTIL HFA;VENTOLIN HFA) 108 (90 Base) MCG/ACT inhaler INHALE ONE PUFF BY MOUTH EVERY 6 HOURS AS NEEDED FOR WHEEZING AND FOR SHORTNESS OF BREATH (USING 5-6 TIMES EVERY 24 HOURS) 01/01/18  Yes Collene Gobble, MD  albuterol (PROVENTIL) (2.5 MG/3ML) 0.083% nebulizer solution Take 3 mLs (2.5 mg total) by nebulization every 4 (four) hours. And as needed 12/29/17  Yes Byrum, Rose Fillers, MD  azithromycin (ZITHROMAX Z-PAK) 250 MG tablet Take 2 tablets (500 mg) on  Day 1,  followed by 1 tablet (250 mg) once daily on Days 2 through 5. 03/01/18 03/06/18 Yes Gambino, Arlie Solomons, MD  budesonide (PULMICORT) 0.5 MG/2ML nebulizer solution USE ONE VIAL IN NEBULIZER TWICE DAILY 09/27/17  Yes Byrum, Rose Fillers, MD  cefdinir (OMNICEF) 300 MG capsule Take 1 capsule (300 mg total) by mouth 2 (two) times daily for 10 days. 03/01/18 03/11/18 Yes Carlyle Dolly, MD    digoxin (LANOXIN) 0.125 MG tablet Take 1 tablet (0.125 mg total) by mouth daily. 01/10/18  Yes Lelon Perla, MD  diltiazem (CARDIZEM CD) 240 MG 24 hr capsule Take 1 capsule (240 mg total) by mouth daily. 01/10/18  Yes Lelon Perla, MD  furosemide (LASIX) 40 MG tablet Take 1 and 1/2 tab po qd Patient taking differently: Take 40 mg by mouth daily.  01/10/18  Yes Lelon Perla, MD  LORazepam (ATIVAN) 0.5 MG tablet Take 0.5 mg by mouth as needed for anxiety.   Yes [provider]  pravastatin (PRAVACHOL) 40 MG tablet TAKE 1 TABLET EVERY EVENING Patient taking differently: Take 40 mg by mouth daily. TAKE 1 TABLET EVERY EVENING 01/10/18  Yes Crenshaw, Denice Bors, MD  predniSONE (DELTASONE) 20 MG tablet Take 20 mg by mouth daily with breakfast.   Yes [provider]  [place] 2.5-2.5 MCG/ACT AERS INHALE 2 PUFFS INTO THE LUNGS DAILY. 09/20/17  Yes Collene Gobble, MD  warfarin (COUMADIN) 5 MG tablet Take 0.5-1 tablets (2.5-5 mg total) by mouth See admin instructions. 5mg  daily except Friday take 2.5mg  AS DIRECTED BY COUMADIN CLINIC Patient taking differently: Take 2.5-5 mg by mouth daily.  01/10/18  Yes Lelon Perla, MD  predniSONE (DELTASONE) 50 MG tablet Take 1 tablet (50 mg total) by mouth daily with breakfast for 3 days. Patient not taking: Reported on 03/04/2018 03/02/18 03/05/18  Carlyle Dolly, MD    Family History Family History  Problem Relation Age of Onset  . Cancer Mother        spinal  . Stroke Father   . Heart disease Father   . Colon cancer Neg Hx   . Heart attack Neg Hx     Social History Social History   Tobacco Use  . Smoking status: Former Smoker    Packs/day: 3.00    Years: 60.00    Pack years: 180.00    Types: Cigarettes    Last attempt to quit: 09/19/2002    Years since quitting: 15.4  . Smokeless tobacco: Never Used  Substance Use Topics  . Alcohol use: No    Alcohol/week: 0.0 oz  . Drug use: No     Allergies   Patient  has no known allergies.   Review of Systems Review of Systems  Constitutional: Negative for fever.  HENT: Negative for congestion.   Eyes: Negative for redness.  Respiratory: Positive for shortness of breath. Negative for wheezing.   Cardiovascular: Positive for leg swelling. Negative for chest pain.  Gastrointestinal: Negative for abdominal pain.  Genitourinary: Negative for dysuria.  Musculoskeletal: Negative for myalgias.  Skin: Negative for rash.  Neurological: Negative for syncope.  Hematological: Bruises/bleeds easily.  Psychiatric/Behavioral: Negative for confusion.     Physical Exam Updated Vital Signs BP (!) 152/84 (BP Location: Left Arm)   Pulse 91   Temp 97.6 F (36.4 C) (Oral)   Resp (!) 28   Ht 1.727 m (5\' 8" )   Wt 86.2 kg (190 lb)   SpO2 93%   BMI 28.89 kg/m   Physical Exam  Constitutional: He is oriented to person, place, and time. He appears well-developed and well-nourished. No distress.  HENT:  Head: Normocephalic and atraumatic.  Mouth/Throat: Oropharynx is clear and moist.  Eyes: Pupils are equal, round, and reactive to light. Conjunctivae and EOM are normal.  Neck: Neck supple.  Cardiovascular: Normal rate, regular rhythm and normal heart sounds.  Pulmonary/Chest: Breath sounds normal. He is in respiratory distress. He has no wheezes.  Abdominal: Soft. Bowel sounds are normal. There is no tenderness.  Musculoskeletal: Normal range of motion. He exhibits edema.  Neurological: He is alert and oriented to person, place, and time. No cranial nerve deficit or sensory deficit. He exhibits normal muscle tone. Coordination normal.  Skin: Skin is warm. No rash noted.  Nursing note and vitals reviewed.    ED Treatments / Results  Labs (all labs ordered are listed, but only abnormal results are displayed) Labs Reviewed  BASIC METABOLIC PANEL - Abnormal; Notable for the following components:      Result Value   Sodium 134 (*)    Chloride 95 (*)     Glucose, Bld 556 (*)    BUN 32 (*)    Creatinine, Ser 1.70 (*)    GFR calc non Af Amer 36 (*)    GFR calc Af Amer 42 (*)    Anion gap 16 (*)  All other components within normal limits  CBC WITH DIFFERENTIAL/PLATELET - Abnormal; Notable for the following components:   Neutro Abs 8.8 (*)    All other components within normal limits  BRAIN NATRIURETIC PEPTIDE - Abnormal; Notable for the following components:   B Natriuretic Peptide 144.2 (*)    All other components within normal limits  CBG MONITORING, ED - Abnormal; Notable for the following components:   Glucose-Capillary 426 (*)    All other components within normal limits    EKG EKG Interpretation  Date/Time:  Sunday March 04 2018 17:50:37 EDT Ventricular Rate:  112 PR Interval:    QRS Duration: 119 QT Interval:  349 QTC Calculation: 477 R Axis:   171 Text Interpretation:  Atrial fibrillation Ventricular premature complex Right bundle branch block Lateral infarct, old No significant change since last tracing Confirmed by Fredia Sorrow [phone]) on 03/04/2018 6:06:09 PM   Radiology Ct Chest W Contrast  Result Date: 03/04/2018 CLINICAL DATA:  Short of breath EXAM: CT CHEST WITH CONTRAST TECHNIQUE: Multidetector CT imaging of the chest was performed during intravenous contrast administration. CONTRAST:  17mL ISOVUE-300 IOPAMIDOL (ISOVUE-300) INJECTION 61% COMPARISON:  Chest x-ray 03/04/2018, CT chest 12/14/2009 FINDINGS: Cardiovascular: Nonaneurysmal aorta. Moderate aortic atherosclerosis. Coronary vascular calcification. Borderline heart size. No significant pericardial effusion. Mitral valve prosthesis. Mediastinum/Nodes: Midline trachea. No thyroid mass. Stable scattered mediastinal lymph nodes. Largest is seen in the subcarinal region and measures 14 mm. Esophagus within normal limits Lungs/Pleura: Moderate emphysema. Decreased size of right upper lobe pulmonary nodule now measuring 4 mm compared to 6 mm prior. Interim finding of  slightly spiculated nodular foci of airspace disease in the left lower lobe, measuring 14 mm posteriorly and 15 mm anteriorly. No pleural effusion Upper Abdomen: Stable to decreased size of left adrenal nodule/adenoma now measuring 19 mm. No acute abnormality in the upper abdomen. Musculoskeletal: Post sternotomy changes. No acute or suspicious bony abnormality. IMPRESSION: 1. Irregular and somewhat nodular/spiculated foci of airspace disease in the left lower lobe, possibly due to pneumonia, however lung neoplasm is also a concern. Short interval CT follow-up in 4-6 weeks following antibiotic trial is suggested. 2. Moderate emphysema 3. Decreased size of left adrenal gland adenoma. Aortic Atherosclerosis (ICD10-I70.0) and Emphysema (ICD10-J43.9). Electronically Signed   By: Donavan Foil M.D.   On: 03/04/2018 21:06   Dg Chest Port 1 View  Result Date: 03/04/2018 CLINICAL DATA:  Shortness of breath. EXAM: PORTABLE CHEST 1 VIEW COMPARISON:  03/01/2018 FINDINGS: Cardiac enlargement with aortic atherosclerosis noted. No pleural effusions identified. Pulmonary vascular congestion and bibasilar atelectasis noted. Asymmetric opacity in the left base may represent pneumonia. IMPRESSION: 1. Bibasilar atelectasis with asymmetric left base opacity which may represent pneumonia. 2. Cardiac enlargement with aortic atherosclerosis. Aortic Atherosclerosis (ICD10-I70.0). 3. Pulmonary vascular congestion. Electronically Signed   By: Kerby Moors M.D.   On: 03/04/2018 18:20    Procedures Procedures (including critical care time)  Medications Ordered in ED Medications  cefTRIAXone (ROCEPHIN) 1 g in sodium chloride 0.9 % 100 mL IVPB (has no administration in time range)  azithromycin (ZITHROMAX) 500 mg in sodium chloride 0.9 % 250 mL IVPB (has no administration in time range)  insulin aspart (novoLOG) injection 10 Units (10 Units Intravenous Given 03/04/18 1945)  iopamidol (ISOVUE-300) 61 % injection 100 mL (80 mLs  Intravenous Contrast Given 03/04/18 2022)     Initial Impression / Assessment and Plan / ED Course  I have reviewed the triage vital signs and the nursing notes.  Pertinent labs &  imaging results that were available during my care of the patient were reviewed by me and considered in my medical decision making (see chart for details).     Patient seen at med center [facility] on Thursday.  Diagnosis of pneumonia.  Started on antibiotics orally.  Patient also started on steroids.  Patient was on some baseline steroids but increased to 50 mg each day.  Probably explains the hyperglycemia.  Patient's CT chest shows left lower lobe pneumonia.  Patient's had an increased oxygen requirement here is normally on 3 L of oxygen at home but he was requiring Venturi mask.  If he put him back on his 3 L he desats down to around 86%.  Patient will be started on IV Rocephin and Zithromax for community-acquired pneumonia.  Patient has not been admitted in the last 3 months.  Last admission was in the fall.  CT with contrast showed no evidence of pulmonary embolus also no evidence of pulmonary edema.  Patient will require admission.  Discussed with hospitalist.    Final Clinical Impressions(s) / ED Diagnoses   Final diagnoses:  Hypoxia  Hyperglycemia  Community acquired pneumonia of left lower lobe of lung [facility])    ED Discharge Orders    None       Fredia Sorrow, MD 03/04/18 2207

## 2018-03-04 NOTE — ED Notes (Signed)
ED TO INPATIENT HANDOFF REPORT  Name/Age/Gender [name] 82 y.o. male  Code Status    Code Status Orders  (From admission, onward)        Start     Ordered   03/04/18 2216  Full code  Continuous     03/04/18 2217    Code Status History    Date Active Date Inactive Code Status Order ID Comments User Context   07/18/2017 1825 07/19/2017 2047 Full Code [id number]  Aline August, MD Inpatient   01/01/2015 0910 01/01/2015 1358 Full Code [id number]  Martin[place], Peter M, MD Inpatient   11/04/2014 1835 11/06/2014 1638 Full Code [id number]  Melton Alar, PA-C Inpatient    Advance Directive Documentation     Most Recent Value  Type of Advance Directive  Healthcare Power of Attorney, Living will  Pre-existing out of facility DNR order (yellow form or pink MOST form)  -  "MOST" Form in Place?  -      Home/SNF/Other Home  Chief Complaint Shortness of Breath   Level of Care/Admitting Diagnosis ED Disposition    ED Disposition Condition [place]: [place] [[id number]]  Level of Care: Telemetry [5]  Admit to tele based on following criteria: Complex arrhythmia (Bradycardia/Tachycardia)  Diagnosis: Community acquired pneumonia of left lower lobe of lung [facility]) [[id number]]  Admitting Physician: Doreatha Massed  Attending Physician: Etta Quill [phone]  Estimated length of stay: past midnight tomorrow  Certification:: I certify this patient will need inpatient services for at least 2 midnights  PT Class (Do Not Modify): Inpatient [101]  PT Acc Code (Do Not Modify): Private [1]       Medical History Past Medical History:  Diagnosis Date  . Adrenal adenoma   . Atrial fibrillation (HCC)    Rate control + Coumadin  . Atrial flutter ([place])   . CAD (coronary artery disease)    Lexiscan Myoview (01/2014): Lateral soft tissue attenuation, no ischemia, not gated, low risk  . Cancer ([place])   . CAP (community acquired pneumonia)     Hospitalized 10/2014  . Chronic diastolic heart failure ([place])   . Chronic hypoxemic respiratory failure (HCC)    from COPD  . Chronic renal insufficiency, stage III (moderate) (HCC)    CrCl 50s  . COPD (chronic obstructive pulmonary disease) (HCC)    oxygen-dependent (2.5 L 24/7).  Improved on starting chronic prednisone 06/2017 (increased to 20 mg qd 08/2017)  Progressive worsening dyspnea at pulm f/u 10/19/17--? changed to continuous 02 from pulsed delivery (?)--no change in med regimen.  . Diabetes mellitus without complication ([place]) 41/96/2229   A1c 6.5 % 05/2016  . Diverticulosis   . DOE (dyspnea on exertion)    Multifactorial: deconditioning, COPD, obesity hypoventilation syndrome.  Marland Kitchen Dyslipidemia   . Embolic stroke ([place])    d/t endocarditis 2008  . Hemorrhoids   . History of mitral valve replacement    needs SBE prophylaxis  . History of prostate cancer remote past   f/u by Dr. Claris Che; no recurrence as of f/u 10/2015.  PSA rising from 2013 to 06/2016: as of 09/2017 plan is bone scan and CT abd/pelv if PSA gets up around 10.  Marland Kitchen HTN (hypertension)   . Mediastinal lymphadenopathy   . Pulmonary hypertension ([place]) 01/09/2015   secondary  . Right thyroid nodule   . Thrombocytopenia ([place])   . TIA (transient ischemic attack) 03/25/2011   ? of due to transient diplopia  .  Ulcerative colitis    left-sided/segmental, associated with diverticulosis.  Sulfasalazine per GI (Dr. Carlean Purl).  . Warthin's tumor     Allergies No Known Allergies  IV Location/Drains/Wounds Patient Lines/Drains/Airways Status   Active Line/Drains/Airways    Name:   Placement date:   Placement time:   Site:   Days:   Peripheral IV 03/04/18 Right Antecubital   03/04/18    1753    Antecubital   less than 1          Labs/Imaging Results for orders placed or performed during the hospital encounter of 03/04/18 (from the past 48 hour(s))  Basic metabolic panel     Status: Abnormal   Collection Time: 03/04/18   6:03 PM  Result Value Ref Range   Sodium 134 (L) 135 - 145 mmol/L   Potassium 4.5 3.5 - 5.1 mmol/L   Chloride 95 (L) 101 - 111 mmol/L   CO2 23 22 - 32 mmol/L   Glucose, Bld 556 (HH) 65 - 99 mg/dL    Comment: rbv Dealva Lafoy,H 1927 [id number] [place],N    BUN 32 (H) 6 - 20 mg/dL   Creatinine, Ser 1.70 (H) 0.61 - 1.24 mg/dL   Calcium 9.1 8.9 - 10.3 mg/dL   GFR calc non Af Amer 36 (L) >60 mL/min   GFR calc Af Amer 42 (L) >60 mL/min    Comment: (NOTE) The eGFR has been calculated using the CKD EPI equation. This calculation has not been validated in all clinical situations. eGFR's persistently <60 mL/min signify possible Chronic Kidney Disease.    Anion gap 16 (H) 5 - 15    Comment: Performed at [facility], [place] [address], [place], [place] [address]  CBC with Differential/Platelet     Status: Abnormal   Collection Time: 03/04/18  6:03 PM  Result Value Ref Range   WBC 10.2 4.0 - 10.5 K/uL   RBC 4.63 4.22 - 5.81 MIL/uL   Hemoglobin 13.6 13.0 - 17.0 g/dL   HCT 42.3 39.0 - 52.0 %   MCV 91.4 78.0 - 100.0 fL   MCH 29.4 26.0 - 34.0 pg   MCHC 32.2 30.0 - 36.0 g/dL   RDW 15.2 11.5 - 15.5 %   Platelets 170 150 - 400 K/uL   Neutrophils Relative % 87 %   Neutro Abs 8.8 (H) 1.7 - 7.7 K/uL   Lymphocytes Relative 7 %   Lymphs Abs 0.7 0.7 - 4.0 K/uL   Monocytes Relative 6 %   Monocytes Absolute 0.6 0.1 - 1.0 K/uL   Eosinophils Relative 0 %   Eosinophils Absolute 0.0 0.0 - 0.7 K/uL   Basophils Relative 0 %   Basophils Absolute 0.0 0.0 - 0.1 K/uL    Comment: Performed at [facility], [place] [address], [place], [place] [address]  Brain natriuretic peptide     Status: Abnormal   Collection Time: 03/04/18  6:03 PM  Result Value Ref Range   B Natriuretic Peptide 144.2 (H) 0.0 - 100.0 pg/mL    Comment: Performed at [facility], [place] [address], [place], [place] [address]  CBG monitoring, ED     Status: Abnormal   Collection Time: 03/04/18   7:44 PM  Result Value Ref Range   Glucose-Capillary 426 (H) 65 - 99 mg/dL   Ct Chest W Contrast  Result Date: 03/04/2018 CLINICAL DATA:  Short of breath EXAM: CT CHEST WITH CONTRAST TECHNIQUE: Multidetector CT imaging of the chest was performed during intravenous contrast administration. CONTRAST:  28m ISOVUE-300 IOPAMIDOL (ISOVUE-300) INJECTION 61% COMPARISON:  Chest x-ray 03/04/2018, CT chest 12/14/2009 FINDINGS: Cardiovascular: Nonaneurysmal aorta. Moderate aortic atherosclerosis. Coronary vascular calcification. Borderline heart size. No significant pericardial effusion. Mitral valve prosthesis. Mediastinum/Nodes: Midline trachea. No thyroid mass. Stable scattered mediastinal lymph nodes. Largest is seen in the subcarinal region and measures 14 mm. Esophagus within normal limits Lungs/Pleura: Moderate emphysema. Decreased size of right upper lobe pulmonary nodule now measuring 4 mm compared to 6 mm prior. Interim finding of slightly spiculated nodular foci of airspace disease in the left lower lobe, measuring 14 mm posteriorly and 15 mm anteriorly. No pleural effusion Upper Abdomen: Stable to decreased size of left adrenal nodule/adenoma now measuring 19 mm. No acute abnormality in the upper abdomen. Musculoskeletal: Post sternotomy changes. No acute or suspicious bony abnormality. IMPRESSION: 1. Irregular and somewhat nodular/spiculated foci of airspace disease in the left lower lobe, possibly due to pneumonia, however lung neoplasm is also a concern. Short interval CT follow-up in 4-6 weeks following antibiotic trial is suggested. 2. Moderate emphysema 3. Decreased size of left adrenal gland adenoma. Aortic Atherosclerosis (ICD10-I70.0) and Emphysema (ICD10-J43.9). Electronically Signed   By: KDonavan FoilM.D.   On: 03/04/2018 21:06   Dg Chest Port 1 View  Result Date: 03/04/2018 CLINICAL DATA:  Shortness of breath. EXAM: PORTABLE CHEST 1 VIEW COMPARISON:  03/01/2018 FINDINGS: Cardiac enlargement  with aortic atherosclerosis noted. No pleural effusions identified. Pulmonary vascular congestion and bibasilar atelectasis noted. Asymmetric opacity in the left base may represent pneumonia. IMPRESSION: 1. Bibasilar atelectasis with asymmetric left base opacity which may represent pneumonia. 2. Cardiac enlargement with aortic atherosclerosis. Aortic Atherosclerosis (ICD10-I70.0). 3. Pulmonary vascular congestion. Electronically Signed   By: TKerby MoorsM.D.   On: 03/04/2018 18:20    Pending Labs Unresulted Labs (From admission, onward)   Start     Ordered   03/06/18 0500  Protime-INR  Daily,   R     03/04/18 2228   03/04/18 2228  Protime-INR  Once,   R     03/04/18 2228   03/04/18 2218  Respiratory Panel by PCR  (Respiratory virus panel)  Once,   R     03/04/18 2217   03/04/18 2215  Culture, blood (routine x 2) Call MD if unable to obtain prior to antibiotics being given  BLOOD CULTURE X 2,   R    Comments:  If blood cultures drawn in Emergency Department - Do not draw and cancel order    03/04/18 2217   03/04/18 2215  Culture, sputum-assessment  Once,   R     03/04/18 2217   03/04/18 2215  Gram stain  Once,   R     03/04/18 2217   03/04/18 2215  HIV antibody (Routine Screening)  Once,   R     03/04/18 2217   03/04/18 2215  Strep pneumoniae urinary antigen  Once,   R     03/04/18 2217      Vitals/Pain Today's Vitals   03/04/18 2000 03/04/18 2124 03/04/18 2137 03/04/18 2153  BP: (!) 154/69 (!) 145/83  (!) 152/84  Pulse: 69 83  91  Resp: (!) 27 (!) 25  (!) 28  Temp:    97.6 F (36.4 C)  TempSrc:    Oral  SpO2: 95% 95%  93%  Weight:   190 lb (86.2 kg)   Height:   '5\' 8"'$  (1.727 m)   PainSc:        Isolation Precautions Droplet precaution  Medications  Medications  cefTRIAXone (ROCEPHIN) 1 g in sodium chloride 0.9 % 100 mL IVPB (1 g Intravenous New Bag/Given 03/04/18 2210)  arformoterol (BROVANA) nebulizer solution 15 mcg (has no administration in time range)   umeclidinium bromide (INCRUSE ELLIPTA) 62.5 MCG/INH 1 puff (has no administration in time range)  digoxin (LANOXIN) tablet 0.125 mg (has no administration in time range)  diltiazem (CARDIZEM CD) 24 hr capsule 240 mg (has no administration in time range)  LORazepam (ATIVAN) tablet 0.5 mg (has no administration in time range)  pravastatin (PRAVACHOL) tablet 40 mg (has no administration in time range)  doxycycline (VIBRAMYCIN) 100 mg in sodium chloride 0.9 % 250 mL IVPB (has no administration in time range)  budesonide (PULMICORT) nebulizer solution 0.5 mg (has no administration in time range)  albuterol (PROVENTIL) (2.5 MG/3ML) 0.083% nebulizer solution 2.5 mg (has no administration in time range)  predniSONE (DELTASONE) tablet 40 mg (has no administration in time range)  insulin aspart (novoLOG) injection 0-15 Units (has no administration in time range)  insulin aspart (novoLOG) injection 10 Units (10 Units Intravenous Given 03/04/18 1945)  iopamidol (ISOVUE-300) 61 % injection 100 mL (80 mLs Intravenous Contrast Given 03/04/18 2022)    Mobility walks

## 2018-03-04 NOTE — ED Triage Notes (Signed)
Pt presents in mild distress c/o shortness of breath despite being on regular 3L nasal canula, family at bedside collaborate a report that he was seen 3 days and maybe started on outpatient treatment for PNA.

## 2018-03-05 ENCOUNTER — Inpatient Hospital Stay (HOSPITAL_COMMUNITY): Payer: Medicare Other

## 2018-03-05 DIAGNOSIS — I5032 Chronic diastolic (congestive) heart failure: Secondary | ICD-10-CM

## 2018-03-05 DIAGNOSIS — I351 Nonrheumatic aortic (valve) insufficiency: Secondary | ICD-10-CM

## 2018-03-05 LAB — CBC WITH DIFFERENTIAL/PLATELET
BASOS PCT: 0 %
Basophils Absolute: 0 10*3/uL (ref 0.0–0.1)
EOS ABS: 0 10*3/uL (ref 0.0–0.7)
Eosinophils Relative: 0 %
HCT: 41.6 % (ref 39.0–52.0)
Hemoglobin: 13.5 g/dL (ref 13.0–17.0)
Lymphocytes Relative: 11 %
Lymphs Abs: 1.2 10*3/uL (ref 0.7–4.0)
MCH: 28.7 pg (ref 26.0–34.0)
MCHC: 32.5 g/dL (ref 30.0–36.0)
MCV: 88.5 fL (ref 78.0–100.0)
MONO ABS: 0.5 10*3/uL (ref 0.1–1.0)
Monocytes Relative: 5 %
NEUTROS ABS: 8.9 10*3/uL — AB (ref 1.7–7.7)
Neutrophils Relative %: 84 %
PLATELETS: 169 10*3/uL (ref 150–400)
RBC: 4.7 MIL/uL (ref 4.22–5.81)
RDW: 15 % (ref 11.5–15.5)
WBC: 10.6 10*3/uL — ABNORMAL HIGH (ref 4.0–10.5)

## 2018-03-05 LAB — RESPIRATORY PANEL BY PCR
Adenovirus: NOT DETECTED
BORDETELLA PERTUSSIS-RVPCR: NOT DETECTED
CHLAMYDOPHILA PNEUMONIAE-RVPPCR: NOT DETECTED
Coronavirus 229E: NOT DETECTED
Coronavirus HKU1: NOT DETECTED
Coronavirus NL63: NOT DETECTED
Coronavirus OC43: NOT DETECTED
INFLUENZA A-RVPPCR: NOT DETECTED
INFLUENZA B-RVPPCR: NOT DETECTED
Metapneumovirus: NOT DETECTED
Mycoplasma pneumoniae: NOT DETECTED
PARAINFLUENZA VIRUS 3-RVPPCR: NOT DETECTED
PARAINFLUENZA VIRUS 4-RVPPCR: NOT DETECTED
Parainfluenza Virus 1: NOT DETECTED
Parainfluenza Virus 2: NOT DETECTED
RHINOVIRUS / ENTEROVIRUS - RVPPCR: DETECTED — AB
Respiratory Syncytial Virus: NOT DETECTED

## 2018-03-05 LAB — PROTIME-INR
INR: 3.38
PROTHROMBIN TIME: 33.9 s — AB (ref 11.4–15.2)

## 2018-03-05 LAB — BASIC METABOLIC PANEL
Anion gap: 13 (ref 5–15)
BUN: 24 mg/dL — AB (ref 6–20)
CHLORIDE: 99 mmol/L — AB (ref 101–111)
CO2: 26 mmol/L (ref 22–32)
CREATININE: 1.23 mg/dL (ref 0.61–1.24)
Calcium: 9.3 mg/dL (ref 8.9–10.3)
GFR calc Af Amer: >60 mL/min (ref >60)
GFR, EST NON AFRICAN AMERICAN: 54 mL/min — AB (ref >60)
Glucose, Bld: 288 mg/dL — ABNORMAL HIGH (ref 65–99)
Potassium: 4.4 mmol/L (ref 3.5–5.1)
SODIUM: 138 mmol/L (ref 135–145)

## 2018-03-05 LAB — GLUCOSE, CAPILLARY
GLUCOSE-CAPILLARY: 227 mg/dL — AB (ref 65–99)
GLUCOSE-CAPILLARY: 277 mg/dL — AB (ref 65–99)
GLUCOSE-CAPILLARY: 276 mg/dL — AB (ref 65–99)
Glucose-Capillary: 227 mg/dL — ABNORMAL HIGH (ref 65–99)

## 2018-03-05 LAB — EXPECTORATED SPUTUM ASSESSMENT W GRAM STAIN, RFLX TO RESP C

## 2018-03-05 LAB — STREP PNEUMONIAE URINARY ANTIGEN: Strep Pneumo Urinary Antigen: NEGATIVE

## 2018-03-05 LAB — EXPECTORATED SPUTUM ASSESSMENT W REFEX TO RESP CULTURE

## 2018-03-05 LAB — ECHOCARDIOGRAM COMPLETE
HEIGHTINCHES: 68 in
WEIGHTICAEL: 2980.62 [oz_av]

## 2018-03-05 LAB — HIV ANTIBODY (ROUTINE TESTING W REFLEX): HIV Screen 4th Generation wRfx: NONREACTIVE

## 2018-03-05 MED ORDER — INSULIN ASPART 100 UNIT/ML ~~LOC~~ SOLN
4.0000 [IU] | Freq: Three times a day (TID) | SUBCUTANEOUS | Status: DC
  Administered 2018-03-05 – 2018-03-07 (×7): 4 [IU] via SUBCUTANEOUS

## 2018-03-05 MED ORDER — SODIUM CHLORIDE 0.9 % IV SOLN
1.0000 g | INTRAVENOUS | Status: DC
  Administered 2018-03-05 – 2018-03-06 (×2): 1 g via INTRAVENOUS
  Filled 2018-03-05: qty 1
  Filled 2018-03-05: qty 10
  Filled 2018-03-05: qty 1

## 2018-03-05 MED ORDER — GUAIFENESIN 100 MG/5ML PO SOLN
5.0000 mL | Freq: Once | ORAL | Status: DC
  Filled 2018-03-05: qty 10

## 2018-03-05 MED ORDER — ALBUTEROL SULFATE (2.5 MG/3ML) 0.083% IN NEBU
2.5000 mg | INHALATION_SOLUTION | RESPIRATORY_TRACT | Status: DC
Start: 2018-03-05 — End: 2018-03-07
  Administered 2018-03-05 – 2018-03-07 (×8): 2.5 mg via RESPIRATORY_TRACT
  Filled 2018-03-05 (×8): qty 3

## 2018-03-05 NOTE — Progress Notes (Signed)
ANTICOAGULATION CONSULT NOTE - Initial Consult  Pharmacy Consult for Warfarin Indication: atrial fibrillation  No Known Allergies  Patient Measurements: Height: 5\' 8"  (172.7 cm) Weight: 190 lb (86.2 kg) IBW/kg (Calculated) : 68.4   Vital Signs: Temp: 97.6 F (36.4 C) (06/16 2153) Temp Source: Oral (06/16 2153) BP: 145/88 (06/17 0025) Pulse Rate: 83 (06/17 0025)  Labs: Recent Labs    03/04/18 1803 03/04/18 2349  HGB 13.6  --   HCT 42.3  --   PLT 170  --   LABPROT  --  33.9*  INR  --  3.38  CREATININE 1.70*  --     Estimated Creatinine Clearance: 37 mL/min (A) (by C-G formula based on SCr of 1.7 mg/dL (H)).   Medical History: Past Medical History:  Diagnosis Date  . Adrenal adenoma   . Atrial fibrillation (HCC)    Rate control + Coumadin  . Atrial flutter ([place])   . CAD (coronary artery disease)    Lexiscan Myoview (01/2014): Lateral soft tissue attenuation, no ischemia, not gated, low risk  . Cancer ([place])   . CAP (community acquired pneumonia)    Hospitalized 10/2014  . Chronic diastolic heart failure ([place])   . Chronic hypoxemic respiratory failure (HCC)    from COPD  . Chronic renal insufficiency, stage III (moderate) (HCC)    CrCl 50s  . COPD (chronic obstructive pulmonary disease) (HCC)    oxygen-dependent (2.5 L 24/7).  Improved on starting chronic prednisone 06/2017 (increased to 20 mg qd 08/2017)  Progressive worsening dyspnea at pulm f/u 10/19/17--? changed to continuous 02 from pulsed delivery (?)--no change in med regimen.  . Diabetes mellitus without complication ([place]) 77/82/4235   A1c 6.5 % 05/2016  . Diverticulosis   . DOE (dyspnea on exertion)    Multifactorial: deconditioning, COPD, obesity hypoventilation syndrome.  Marland Kitchen Dyslipidemia   . Embolic stroke ([place])    d/t endocarditis 2008  . Hemorrhoids   . History of mitral valve replacement    needs SBE prophylaxis  . History of prostate cancer remote past   f/u by Dr. Claris Che; no recurrence as of  f/u 10/2015.  PSA rising from 2013 to 06/2016: as of 09/2017 plan is bone scan and CT abd/pelv if PSA gets up around 10.  Marland Kitchen HTN (hypertension)   . Mediastinal lymphadenopathy   . Pulmonary hypertension ([place]) 01/09/2015   secondary  . Right thyroid nodule   . Thrombocytopenia ([place])   . TIA (transient ischemic attack) 03/25/2011   ? of due to transient diplopia  . Ulcerative colitis    left-sided/segmental, associated with diverticulosis.  Sulfasalazine per GI (Dr. Carlean Purl).  . Warthin's tumor     Medications:  Scheduled:  . albuterol  2.5 mg Nebulization Q4H  . arformoterol  15 mcg Nebulization BID  . budesonide  0.5 mg Nebulization BID  . digoxin  0.125 mg Oral Daily  . diltiazem  240 mg Oral Daily  . insulin aspart  0-15 Units Subcutaneous TID WC  . pravastatin  40 mg Oral q1800  . predniSONE  40 mg Oral Q breakfast  . umeclidinium bromide  1 puff Inhalation Daily   Infusions:  . doxycycline (VIBRAMYCIN) IV 100 mg (03/04/18 2356)    Assessment: 67 yoM with hx of COPD steroid and O2 dependent c/o of SOB. Patient on chronic warfarin for a-fib. HD 2.5 M/W/W and 5 mg TuThSat/Sun. LD 6/15 2100.  INR=3.38 on admission.  Goal of Therapy:  INR 2-3    Plan:  Daily PT/INR  [name] 03/05/2018,1:38 AM

## 2018-03-05 NOTE — Progress Notes (Addendum)
PROGRESS NOTE  [name] GGY:[id number] DOB: 1935-12-04 DOA: 03/04/2018 PCP: Tammi Sou, MD  HPI/Recap of past 24 hours: [name] is a 82 year old male with medical history significant for COPD steroid and oxygen 3 L dependent at baseline, CAD, CHF, MVR, A. fib on Coumadin, hypertension, diabetes presents to the ED with worsening shortness of breath, productive cough.  Patient was recently diagnosed with left lower lobe CAP, was started on Omnicef, Z-Pak and steroids.  Patient despite taking all medication, still had persistent shortness of breath and has currently run out of his prednisone.  Patient has chronic bilateral lower extremity edema which is at his baseline as per his wife.. In the ED, CT chest showed LLL pneumonia, BNP 144, hyperglycemia with CBGs in the 500s, AKI and also had increased oxygen requirements. Patient admitted for further management.   Today, patient reported still feeling short of breath, denies any worsening cough, fever/chills, chest pain, abdominal pain.   Assessment/Plan: Principal Problem:   Community acquired pneumonia of left lower lobe of lung ([place]) Active Problems:   Essential hypertension, benign   COPD (chronic obstructive pulmonary disease) with emphysema (HCC)   MITRAL VALVE REPLACEMENT, HX OF   Atrial fibrillation (HCC)   Acute on chronic respiratory failure with hypoxia (HCC)   Chronic congestive heart failure (HCC)   AKI (acute kidney injury) (HCC)   DM2 (diabetes mellitus, type 2) (HCC)  CAP/Rhinovirus & enterovirus bronchitis Afebrile, with no leukocytosis Urine strep pneumo negative, Legionella pending collection Respiratory viral panel positive for rhinovirus/enterovirus Blood culture pending Sputum culture pending CT chest showed irregular and somewhat nodular/spiculated foci of airspace disease in the left lower lobe, possibly due to pneumonia however lung neoplasm is also a concern.  CT follow-up in 4 to 6 weeks  recommended Continue IV Rocephin, doxycycline Monitor closely  Acute on chronic hypoxic respiratory failure/COPD exacerbation Oxygen 3 L, steroid-dependent Likely due to above Continue nebulizers, inhalers, prednisone  AKI on CKD stage III ?? Prerenal Baseline creatinine around 1.1-1.2, on admission 1.7 Hold home Lasix Avoid nephrotoxic's, daily BMP  Acute on chronic diastolic HF/pulmonary hypertension/MVR Worsening SOB, BNP slightly rising, likely worsened by pneumonia Chest x-ray with pulmonary vascular congestion Echo showed EF of 60 to 65%, PA peak pressure of 72 mmHg, in A. fib Will plan to resume Lasix in the a.m., held due to AKI (could also be vascular congestion) Telemetry monitor  Chronic A. Fib Currently rate controlled Continue Cardizem, digoxin, Coumadin Pharmacy to dose Coumadin  DM type 2 Uncontrolled, with hyperglycemia, CBGs 500s and on admission Last A1c 7.6 on 12/2017 Likely due to chronic steroid use Not on any regimen at home Start moderate SSI, NovoLog 3 times daily, Accu-Cheks  Hypertension Stable Continue Cardizem  Hyperlipidemia Continue statins      Code Status: Full  Family Communication: Wife at bedside  Disposition Plan: Home once stable   Consultants:  None  Procedures:  None  Antimicrobials:  Rocephin  Doxycycline  DVT prophylaxis: Coumadin   Objective: Vitals:   03/05/18 0757 03/05/18 0944 03/05/18 1147 03/05/18 1312  BP:    134/67  Pulse:    98  Resp:    18  Temp:    (!) 97.5 F (36.4 C)  TempSrc:    Oral  SpO2: 97% 97% 94% 96%  Weight:      Height:        Intake/Output Summary (Last 24 hours) at 03/05/2018 1517 Last data filed at 03/05/2018 0731 Gross per 24 hour  Intake 350 ml  Output 700 ml  Net -350 ml   Filed Weights   03/04/18 2137 03/05/18 0025  Weight: 86.2 kg (190 lb) 84.5 kg (186 lb 4.6 oz)    Exam:   General: Mild distress due to dyspnea  Cardiovascular: S1, S2  present  Respiratory: Diminished breath sounds bilaterally  Abdomen: Soft, nontender, nondistended, bowel sounds present  Musculoskeletal: Bilateral 1+ pedal edema  Skin: Normal  Psychiatry: Normal mood   Data Reviewed: CBC: Recent Labs  Lab 03/01/18 1125 03/04/18 1803  WBC 11.4* 10.2  NEUTROABS 9.6* 8.8*  HGB 14.0 13.6  HCT 41.9 42.3  MCV 87.5 91.4  PLT 133* 956   Basic Metabolic Panel: Recent Labs  Lab 03/01/18 1125 03/04/18 1803  NA 134* 134*  K 3.7 4.5  CL 97* 95*  CO2 26 23  GLUCOSE 295* 556*  BUN 17 32*  CREATININE 1.36* 1.70*  CALCIUM 8.9 9.1   GFR: Estimated Creatinine Clearance: 36.7 mL/min (A) (by C-G formula based on SCr of 1.7 mg/dL (H)). Liver Function Tests: Recent Labs  Lab 03/01/18 1125  AST 20  ALT 18  ALKPHOS 68  BILITOT 1.1  PROT 6.8  ALBUMIN 3.7   No results for input(s): LIPASE, AMYLASE in the last 168 hours. No results for input(s): AMMONIA in the last 168 hours. Coagulation Profile: Recent Labs  Lab 02/27/18 0920 03/04/18 2349  INR 3.1* 3.38   Cardiac Enzymes: No results for input(s): CKTOTAL, CKMB, CKMBINDEX, TROPONINI in the last 168 hours. BNP (last 3 results) No results for input(s): PROBNP in the last 8760 hours. HbA1C: No results for input(s): HGBA1C in the last 72 hours. CBG: Recent Labs  Lab 03/04/18 1944 03/05/18 0736 03/05/18 1120  GLUCAP 426* 227* 227*   Lipid Profile: No results for input(s): CHOL, HDL, LDLCALC, TRIG, CHOLHDL, LDLDIRECT in the last 72 hours. Thyroid Function Tests: No results for input(s): TSH, T4TOTAL, FREET4, T3FREE, THYROIDAB in the last 72 hours. Anemia Panel: No results for input(s): VITAMINB12, FOLATE, FERRITIN, TIBC, IRON, RETICCTPCT in the last 72 hours. Urine analysis:    Component Value Date/Time   COLORURINE YELLOW 11/06/2014 [place] 11/06/2014 0851   LABSPEC 1.017 11/06/2014 0851   PHURINE 5.0 11/06/2014 0851   GLUCOSEU NEGATIVE 11/06/2014 0851    HGBUR SMALL (A) 11/06/2014 0851   BILIRUBINUR small 11/27/2014 1025   KETONESUR NEGATIVE 11/06/2014 0851   PROTEINUR 100 11/27/2014 1025   PROTEINUR NEGATIVE 11/06/2014 0851   UROBILINOGEN 4.0 11/27/2014 1025   UROBILINOGEN 0.2 11/06/2014 0851   NITRITE negative 11/27/2014 1025   NITRITE NEGATIVE 11/06/2014 0851   LEUKOCYTESUR Negative 11/27/2014 1025   Sepsis Labs: @LABRCNTIP (procalcitonin:4,lacticidven:4)  ) Recent Results (from the past 240 hour(s))  Culture, sputum-assessment     Status: None   Collection Time: 03/04/18 11:59 PM  Result Value Ref Range Status   Specimen Description SPUTUM  Final   Special Requests NONE  Final   Sputum evaluation   Final    Sputum specimen not acceptable for testing.  Please recollect.   NOTIFIED H.NJANE,RN [id number] @0226  BY V.WILKINS Performed at [facility] [address] [place], [place] [address]    Report Status 03/05/2018 FINAL  Final  Respiratory Panel by PCR     Status: Abnormal   Collection Time: 03/05/18  1:14 AM  Result Value Ref Range Status   Adenovirus NOT DETECTED NOT DETECTED Final   Coronavirus 229E NOT DETECTED NOT DETECTED Final   Coronavirus HKU1 NOT  DETECTED NOT DETECTED Final   Coronavirus NL63 NOT DETECTED NOT DETECTED Final   Coronavirus OC43 NOT DETECTED NOT DETECTED Final   Metapneumovirus NOT DETECTED NOT DETECTED Final   Rhinovirus / Enterovirus DETECTED (A) NOT DETECTED Final   Influenza A NOT DETECTED NOT DETECTED Final   Influenza B NOT DETECTED NOT DETECTED Final   Parainfluenza Virus 1 NOT DETECTED NOT DETECTED Final   Parainfluenza Virus 2 NOT DETECTED NOT DETECTED Final   Parainfluenza Virus 3 NOT DETECTED NOT DETECTED Final   Parainfluenza Virus 4 NOT DETECTED NOT DETECTED Final   Respiratory Syncytial Virus NOT DETECTED NOT DETECTED Final   Bordetella pertussis NOT DETECTED NOT DETECTED Final   Chlamydophila pneumoniae NOT DETECTED NOT DETECTED Final   Mycoplasma pneumoniae NOT  DETECTED NOT DETECTED Final    Comment: Performed at [facility], [place] [address], [place], [place] [address]  Culture, expectorated sputum-assessment     Status: None   Collection Time: 03/05/18  5:44 AM  Result Value Ref Range Status   Specimen Description EXPECTORATED SPUTUM  Final   Special Requests NONE  Final   Sputum evaluation   Final    THIS SPECIMEN IS ACCEPTABLE FOR SPUTUM CULTURE Performed at [facility], [place] [address] [place], [place] [address]    Report Status 03/05/2018 FINAL  Final  Culture, respiratory (NON-Expectorated)     Status: None (Preliminary result)   Collection Time: 03/05/18  5:44 AM  Result Value Ref Range Status   Specimen Description   Final    EXPECTORATED SPUTUM Performed at [facility] [address] [place], [place] [address]    Special Requests   Final    NONE Reflexed from [id number] Performed at [facility], [place] [address], [place], [place] [address]    Gram Stain   Final    FEW WBC PRESENT,BOTH PMN AND MONONUCLEAR MODERATE GRAM POSITIVE COCCI IN PAIRS IN CLUSTERS MODERATE GRAM NEGATIVE COCCOBACILLI RARE YEAST RARE GRAM NEGATIVE RODS Performed at [facility], [place] [address], [place], [place] [address]    Culture PENDING  Incomplete   Report Status PENDING  Incomplete      Studies: Ct Chest W Contrast  Result Date: 03/04/2018 CLINICAL DATA:  Short of breath EXAM: CT CHEST WITH CONTRAST TECHNIQUE: Multidetector CT imaging of the chest was performed during intravenous contrast administration. CONTRAST:  49mL ISOVUE-300 IOPAMIDOL (ISOVUE-300) INJECTION 61% COMPARISON:  Chest x-ray 03/04/2018, CT chest 12/14/2009 FINDINGS: Cardiovascular: Nonaneurysmal aorta. Moderate aortic atherosclerosis. Coronary vascular calcification. Borderline heart size. No significant pericardial effusion. Mitral valve prosthesis. Mediastinum/Nodes: Midline trachea. No thyroid mass. Stable  scattered mediastinal lymph nodes. Largest is seen in the subcarinal region and measures 14 mm. Esophagus within normal limits Lungs/Pleura: Moderate emphysema. Decreased size of right upper lobe pulmonary nodule now measuring 4 mm compared to 6 mm prior. Interim finding of slightly spiculated nodular foci of airspace disease in the left lower lobe, measuring 14 mm posteriorly and 15 mm anteriorly. No pleural effusion Upper Abdomen: Stable to decreased size of left adrenal nodule/adenoma now measuring 19 mm. No acute abnormality in the upper abdomen. Musculoskeletal: Post sternotomy changes. No acute or suspicious bony abnormality. IMPRESSION: 1. Irregular and somewhat nodular/spiculated foci of airspace disease in the left lower lobe, possibly due to pneumonia, however lung neoplasm is also a concern. Short interval CT follow-up in 4-6 weeks following antibiotic trial is suggested. 2. Moderate emphysema 3. Decreased size of left adrenal gland adenoma. Aortic  Atherosclerosis (ICD10-I70.0) and Emphysema (ICD10-J43.9). Electronically Signed   By: Donavan Foil M.D.   On: 03/04/2018 21:06   Dg Chest Port 1 View  Result Date: 03/04/2018 CLINICAL DATA:  Shortness of breath. EXAM: PORTABLE CHEST 1 VIEW COMPARISON:  03/01/2018 FINDINGS: Cardiac enlargement with aortic atherosclerosis noted. No pleural effusions identified. Pulmonary vascular congestion and bibasilar atelectasis noted. Asymmetric opacity in the left base may represent pneumonia. IMPRESSION: 1. Bibasilar atelectasis with asymmetric left base opacity which may represent pneumonia. 2. Cardiac enlargement with aortic atherosclerosis. Aortic Atherosclerosis (ICD10-I70.0). 3. Pulmonary vascular congestion. Electronically Signed   By: Kerby Moors M.D.   On: 03/04/2018 18:20    Scheduled Meds: . albuterol  2.5 mg Nebulization Q4H  . arformoterol  15 mcg Nebulization BID  . budesonide  0.5 mg Nebulization BID  . digoxin  0.125 mg Oral Daily  .  diltiazem  240 mg Oral Daily  . guaiFENesin  5 mL Oral Once  . insulin aspart  0-15 Units Subcutaneous TID WC  . insulin aspart  4 Units Subcutaneous TID WC  . pravastatin  40 mg Oral q1800  . predniSONE  40 mg Oral Q breakfast  . umeclidinium bromide  1 puff Inhalation Daily    Continuous Infusions: . cefTRIAXone (ROCEPHIN)  IV    . doxycycline (VIBRAMYCIN) IV 100 mg (03/05/18 1102)     LOS: 1 day     Alma Friendly, MD [facility]  If 7PM-7AM, please contact night-coverage [email or web address] Password [facility] 03/05/2018, 3:17 PM

## 2018-03-05 NOTE — Progress Notes (Signed)
  Echocardiogram 2D Echocardiogram has been performed.  Merrie Roof F 03/05/2018, 10:31 AM

## 2018-03-06 LAB — BASIC METABOLIC PANEL
Anion gap: 8 (ref 5–15)
BUN: 22 mg/dL — AB (ref 6–20)
CALCIUM: 8.9 mg/dL (ref 8.9–10.3)
CO2: 29 mmol/L (ref 22–32)
Chloride: 101 mmol/L (ref 101–111)
Creatinine, Ser: 1.11 mg/dL (ref 0.61–1.24)
GFR calc Af Amer: >60 mL/min (ref >60)
GLUCOSE: 174 mg/dL — AB (ref 65–99)
Potassium: 4 mmol/L (ref 3.5–5.1)
Sodium: 138 mmol/L (ref 135–145)

## 2018-03-06 LAB — CBC WITH DIFFERENTIAL/PLATELET
Basophils Absolute: 0 10*3/uL (ref 0.0–0.1)
Basophils Relative: 0 %
EOS ABS: 0 10*3/uL (ref 0.0–0.7)
EOS PCT: 0 %
HEMATOCRIT: 37.9 % — AB (ref 39.0–52.0)
Hemoglobin: 12.2 g/dL — ABNORMAL LOW (ref 13.0–17.0)
LYMPHS ABS: 1.9 10*3/uL (ref 0.7–4.0)
Lymphocytes Relative: 21 %
MCH: 28.6 pg (ref 26.0–34.0)
MCHC: 32.2 g/dL (ref 30.0–36.0)
MCV: 88.8 fL (ref 78.0–100.0)
MONO ABS: 0.7 10*3/uL (ref 0.1–1.0)
MONOS PCT: 8 %
Neutro Abs: 6.7 10*3/uL (ref 1.7–7.7)
Neutrophils Relative %: 71 %
PLATELETS: 143 10*3/uL — AB (ref 150–400)
RBC: 4.27 MIL/uL (ref 4.22–5.81)
RDW: 15 % (ref 11.5–15.5)
WBC: 9.4 10*3/uL (ref 4.0–10.5)

## 2018-03-06 LAB — GLUCOSE, CAPILLARY
Glucose-Capillary: 152 mg/dL — ABNORMAL HIGH (ref 65–99)
Glucose-Capillary: 195 mg/dL — ABNORMAL HIGH (ref 65–99)
Glucose-Capillary: 403 mg/dL — ABNORMAL HIGH (ref 65–99)
Glucose-Capillary: 206 mg/dL — ABNORMAL HIGH (ref 65–99)

## 2018-03-06 LAB — PROTIME-INR
INR: 2.61
Prothrombin Time: 27.7 seconds — ABNORMAL HIGH (ref 11.4–15.2)

## 2018-03-06 MED ORDER — WARFARIN SODIUM 2.5 MG PO TABS
2.5000 mg | ORAL_TABLET | Freq: Once | ORAL | Status: AC
  Administered 2018-03-06: 2.5 mg via ORAL
  Filled 2018-03-06: qty 1

## 2018-03-06 MED ORDER — WARFARIN - PHARMACIST DOSING INPATIENT: Freq: Every day | Status: DC

## 2018-03-06 MED ORDER — FUROSEMIDE 40 MG PO TABS
40.0000 mg | ORAL_TABLET | Freq: Every day | ORAL | Status: DC
  Administered 2018-03-07: 40 mg via ORAL
  Filled 2018-03-06: qty 1

## 2018-03-06 NOTE — Evaluation (Signed)
Occupational Therapy Evaluation Patient Details Name: [name] MRN: [id number] DOB: April 25, 1936 Today's Date: 03/06/2018    History of Present Illness [name] is a 82 year old male with medical history significant for COPD steroid and oxygen 3 L dependent at baseline, CAD, CHF, MVR, A. fib on Coumadin, hypertension, diabetes presents to the ED with worsening shortness of breath, productive cough.  Patient was recently diagnosed with left lower lobe CAP, was started on Omnicef, Z-Pak and steroids.  Patient despite taking all medication, still had persistent shortness of breath and has currently run out of his prednisone.  Patient has chronic bilateral lower extremity edema which is at his baseline as per his wife.. In the ED, CT chest showed LLL pneumonia, BNP 144, hyperglycemia with CBGs in the 500s, AKI and also had increased oxygen requirements   Clinical Impression   Pt admitted with the above dx.  Pt currently with functional limitations due to the deficits listed below (see OT Problem List).  Pt will benefit from skilled OT to increase their safety and independence with ADL and functional mobility for ADL to facilitate discharge to venue listed below.      Follow Up Recommendations  Home health OT;Supervision/Assistance - 24 hour    Equipment Recommendations  None recommended by OT           Mobility Bed Mobility               General bed mobility comments: pt in chair  Transfers Overall transfer level: Needs assistance   Transfers: Sit to/from Stand;Stand Pivot Transfers Sit to Stand: Min assist Stand pivot transfers: Min assist       General transfer comment: VC for hand placement        ADL either performed or assessed with clinical judgement   ADL Overall ADL's : Needs assistance/impaired Eating/Feeding: Set up;Sitting   Grooming: Standing;Min guard   Upper Body Bathing: Set up;Sitting   Lower Body Bathing: Moderate assistance;Sit to/from  stand;Cueing for sequencing;Cueing for safety   Upper Body Dressing : Set up;Sitting       Toilet Transfer: Moderate assistance;BSC;RW             General ADL Comments: pt fatigued quickly with OT. Sats did stay above 90 on 5L but pt visibly fatigued     Vision Patient Visual Report: No change from baseline              Pertinent Vitals/Pain Pain Assessment: No/denies pain     Hand Dominance     Extremity/Trunk Assessment Upper Extremity Assessment Upper Extremity Assessment: Generalized weakness           Communication Communication Communication: No difficulties   Cognition Arousal/Alertness: Awake/alert Behavior During Therapy: WFL for tasks assessed/performed Overall Cognitive Status: Within Functional Limits for tasks assessed                                                Home Living Family/patient expects to be discharged to:: Private residence Living Arrangements: Spouse/significant other Available Help at Discharge: Family;Available 24 hours/day Type of Home: House Home Access: Stairs to enter [employer] of Steps: 2   Home Layout: Two level;Full bath on main level     Bathroom Shower/Tub: Tub/shower unit;Walk-in shower         Home Equipment: Kasandra Knudsen - single point;Wheelchair - manual  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      Prior Functioning/Environment Level of Independence: Independent                 OT Problem List: Impaired balance (sitting and/or standing);Decreased strength;Decreased activity tolerance      OT Treatment/Interventions: Self-care/ADL training;Patient/family education;DME and/or AE instruction    OT Goals(Current goals can be found in the care plan section) Acute Rehab OT Goals Patient Stated Goal: home to farm OT Goal Formulation: With patient Time For Goal Achievement: 03/13/18 ADL Goals Pt Will Perform Grooming: with  set-up;standing Pt Will Perform Lower Body Dressing: with supervision;sit to/from stand Pt Will Transfer to Toilet: with supervision;regular height toilet;ambulating Pt Will Perform Toileting - Clothing Manipulation and hygiene: with supervision;sit to/from stand  OT Frequency: Min 2X/week              AM-PAC PT "6 Clicks" Daily Activity     Outcome Measure Help from another person eating meals?: None Help from another person taking care of personal grooming?: A Little Help from another person toileting, which includes using toliet, bedpan, or urinal?: A Little Help from another person bathing (including washing, rinsing, drying)?: A Little Help from another person to put on and taking off regular upper body clothing?: A Little Help from another person to put on and taking off regular lower body clothing?: A Lot 6 Click Score: 18   End of Session Equipment Utilized During Treatment: Rolling walker Nurse Communication: Mobility status  Activity Tolerance: Patient limited by fatigue Patient left: in chair;with call bell/phone within reach  OT Visit Diagnosis: Unsteadiness on feet (R26.81);Muscle weakness (generalized) (M62.81);History of falling (Z91.81)                Time: 1020-1036 OT Time Calculation (min): 16 min Charges:  OT General Charges $OT Visit: 1 Visit OT Evaluation $OT Eval Moderate Complexity: 1 Mod G-Codes:     Kari Baars, [place]  Payton Mccallum D 03/06/2018, 11:59 AM

## 2018-03-06 NOTE — Evaluation (Signed)
Physical Therapy Evaluation Patient Details Name: ATANACIO MELNYK MRN: [id number] DOB: October 31, 1935 Today's Date: 03/06/2018   History of Present Illness  Rosco L. Bonner is a 82 year old male with medical history significant for COPD steroid and oxygen 3 L dependent at baseline, CAD, CHF, MVR, A. fib on Coumadin, hypertension, diabetes presents to the ED with worsening shortness of breath, productive cough.  Patient was recently diagnosed with left lower lobe CAP, was started on Omnicef, Z-Pak and steroids.  Patient despite taking all medication, still had persistent shortness of breath and has currently run out of his prednisone.  Patient has chronic bilateral lower extremity edema which is at his baseline as per his wife.. In the ED, CT chest showed LLL pneumonia, BNP 144, hyperglycemia with CBGs in the 500s, AKI and also had increased oxygen requirements  Clinical Impression  The patient  Ambulated x 40 on 4 liters  AFB, noted dyspnea, reports dizziness. Pt admitted with above diagnosis. Pt currently with functional limitations due to the deficits listed below (see PT Problem List).  Pt will benefit from skilled PT to increase their independence and safety with mobility to allow discharge to the venue listed below.   Should progress to return home with assistance.    Follow Up Recommendations Home health PT;Supervision/Assistance - 24 hour    Equipment Recommendations  None recommended by PT    Recommendations for Other Services       Precautions / Restrictions Precautions Precautions: Fall Precaution Comments: monitor O2, on oxygen      Mobility  Bed Mobility               General bed mobility comments: pt in chair  Transfers Overall transfer level: Needs assistance Equipment used: Rolling walker (2 wheeled) Transfers: Sit to/from [facility] Sit to Stand: Min assist Stand pivot transfers: Min assist       General transfer comment: VC for hand  placement  Ambulation/Gait Ambulation/Gait assistance: Min assist Gait Distance (Feet): 40 Feet Assistive device: Rolling walker (2 wheeled) Gait Pattern/deviations: Step-to pattern;Step-through pattern     General Gait Details: On 4 liters, noted $/4 dyspnea, HR  78, complained of dizziness. 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 2 liters O2    Prior Function Level of Independence: Independent               Hand Dominance        Extremity/Trunk Assessment   Upper Extremity Assessment Upper Extremity Assessment: Defer to OT evaluation    Lower Extremity Assessment Lower Extremity Assessment: Generalized weakness    Cervical / Trunk Assessment Cervical / Trunk Assessment: Normal  Communication   Communication: No difficulties  Cognition Arousal/Alertness: Awake/alert Behavior During Therapy: WFL for tasks assessed/performed Overall Cognitive Status: Within Functional Limits for tasks assessed                                        General Comments      Exercises     Assessment/Plan    PT Assessment Patient needs continued PT services  PT Problem List Decreased strength;Decreased activity tolerance;Decreased mobility;Decreased safety awareness       PT Treatment Interventions DME  instruction;Gait training;Functional mobility training;Therapeutic activities;Patient/family education    PT Goals (Current goals can be found in the Care Plan section)  Acute Rehab PT Goals Patient Stated Goal: home to farm PT Goal Formulation: With patient/family Time For Goal Achievement: 03/20/18 Potential to Achieve Goals: Good    Frequency Min 3X/week   Barriers to discharge        Co-evaluation               AM-PAC PT "6 Clicks" Daily Activity  Outcome Measure Difficulty turning over in bed (including adjusting bedclothes, sheets and blankets)?: A Little Difficulty moving from lying on back to sitting on the side of the bed? : A Little Difficulty sitting down on and standing up from a chair with arms (e.g., wheelchair, bedside commode, etc,.)?: A Little Help needed moving to and from a bed to chair (including a wheelchair)?: A Lot Help needed walking in hospital room?: A Lot Help needed climbing 3-5 steps with a railing? : Total 6 Click Score: 14    End of Session Equipment Utilized During Treatment: Gait belt Activity Tolerance: Treatment limited secondary to medical complications (Comment) Patient left: in chair;with call bell/phone within reach;with family/visitor present Nurse Communication: Mobility status PT Visit Diagnosis: Unsteadiness on feet (R26.81)    Time: 2330-0762 PT Time Calculation (min) (ACUTE ONLY): 18 min   Charges:   PT Evaluation $PT Eval Low Complexity: 1 Low     PT G CodesTresa Endo PT [phone]   Claretha Cooper 03/06/2018, 1:12 PM

## 2018-03-06 NOTE — Progress Notes (Signed)
[place] for Warfarin Indication: atrial fibrillation  No Known Allergies  Patient Measurements: Height: 5\' 8"  (172.7 cm) Weight: 186 lb 4.6 oz (84.5 kg) IBW/kg (Calculated) : 68.4   Vital Signs: Temp: 97.8 F (36.6 C) (06/18 0435) Temp Source: Oral (06/18 0435) BP: 138/80 (06/18 0435) Pulse Rate: 73 (06/18 0435)  Labs: Recent Labs    03/04/18 1803 03/04/18 2349 03/05/18 1606 03/06/18 0427  HGB 13.6  --  13.5 12.2*  HCT 42.3  --  41.6 37.9*  PLT 170  --  169 143*  LABPROT  --  33.9*  --  27.7*  INR  --  3.38  --  2.61  CREATININE 1.70*  --  1.23 1.11    Estimated Creatinine Clearance: 56.2 mL/min (by C-G formula based on SCr of 1.11 mg/dL).  Medications:  Scheduled:  . albuterol  2.5 mg Nebulization Q4H WA  . arformoterol  15 mcg Nebulization BID  . budesonide  0.5 mg Nebulization BID  . digoxin  0.125 mg Oral Daily  . diltiazem  240 mg Oral Daily  . guaiFENesin  5 mL Oral Once  . insulin aspart  0-15 Units Subcutaneous TID WC  . insulin aspart  4 Units Subcutaneous TID WC  . pravastatin  40 mg Oral q1800  . predniSONE  40 mg Oral Q breakfast  . umeclidinium bromide  1 puff Inhalation Daily   Infusions:  . cefTRIAXone (ROCEPHIN)  IV Stopped (03/05/18 2254)  . doxycycline (VIBRAMYCIN) IV Stopped (03/06/18 0100)    Assessment: 19 yoM with hx of COPD steroid and O2 dependent c/o of SOB. Patient on chronic warfarin for a-fib and MVR; goal INR 2-3 per [facility] flowsheet. Pharmacy to dose warfarin while admitted.   Baseline INR elevated  Prior anticoagulation: warfarin 2.5 MWF and 5 mg all other days; LD 6/15  Significant events:  Today, 03/06/2018:  CBC: Hgb lower but still > 12; Plt slightly low  INR now therapeutic  Major drug interactions: none  No bleeding issues per nursing  Eating 100% of meals  Goal of Therapy: INR 2-3  Plan:  Warfarin 2.5 mg PO tonight at 18:00  Daily INR  CBC at least q72 hr  while on warfarin  Monitor for signs of bleeding or thrombosis   Reuel Boom, PharmD, BCPS [phone] 03/06/2018, 7:53 AM

## 2018-03-06 NOTE — Progress Notes (Signed)
Inpatient Diabetes Program Recommendations  AACE/ADA: New Consensus Statement on Inpatient Glycemic Control (2015)  Target Ranges:  Prepandial:   less than 140 mg/dL      Peak postprandial:   less than 180 mg/dL (1-2 hours)      Critically ill patients:  140 - 180 mg/dL   Lab Results  Component Value Date   GLUCAP 195 (H) 03/06/2018   HGBA1C 7.6 (H) 01/16/2018    Review of Glycemic Control  Diabetes history: DM2 Outpatient Diabetes medications: None. Current orders for Inpatient glycemic control: Novolog 0-15 units tidwc + 4 units tidwc  PCP had written prescription for glipizide 2.5 mg QD, but pt is not taking.  Inpatient Diabetes Program Recommendations:     For discharge:  Glipizide 5 mg QD  Spoke with pt and son regarding uncontrolled blood sugars with steroids. Son said he prefers pt to not be on insulin. Said he would rather start OHA and keep a check on his blood sugars. Will follow up with PCP.  Discussed with RN.  Thank you. Lorenda Peck, RD, LDN, CDE Inpatient Diabetes Coordinator [phone]

## 2018-03-06 NOTE — Care Management Note (Signed)
Case Management Note  Patient Details  Name: [name] MRN: [id number] Date of Birth: 01-29-1936  Subjective/Objective:   Pt admitted with PNA                  Action/Plan: Plan to discharge home with his wife and [place].   Expected Discharge Date:                  Expected Discharge Plan:  [place]  In-House Referral:     Discharge planning Services  CM Consult  Post Acute Care Choice:    Choice offered to:  Patient, Spouse  DME Arranged:  Oxygen DME Agency:  [facility]  HH Arranged:  RN, PT [place] Agency:  [facility]  Status of Service:  In process, will continue to follow  If discussed at Long Length of Stay Meetings, dates discussed:    Additional CommentsPurcell Mouton, RN 03/06/2018, 9:59 AM

## 2018-03-06 NOTE — Progress Notes (Signed)
PROGRESS NOTE  [name] DVV:[id number] DOB: 03/07/1936 DOA: 03/04/2018 PCP: Tammi Sou, MD  HPI/Recap of past 24 hours: [name]. [name] is a 82 year old male with medical history significant for COPD steroid and oxygen 3 L dependent at baseline, CAD, CHF, MVR, A. fib on Coumadin, hypertension, diabetes presents to the ED with worsening shortness of breath, productive cough.  Patient was recently diagnosed with left lower lobe CAP, was started on Omnicef, Z-Pak and steroids.  Patient despite taking all medication, still had persistent shortness of breath and has currently run out of his prednisone.  Patient has chronic bilateral lower extremity edema which is at his baseline as per his wife.. In the ED, CT chest showed LLL pneumonia, BNP 144, hyperglycemia with CBGs in the 500s, AKI and also had increased oxygen requirements. Patient admitted for further management.   Today, patient reported feeling better, denies any worsening cough, fever/chills, chest pain, abdominal pain. Still requiring about 4L of O2, above baseline.   Assessment/Plan: Principal Problem:   Community acquired pneumonia of left lower lobe of lung ([place]) Active Problems:   Essential hypertension, benign   COPD (chronic obstructive pulmonary disease) with emphysema (HCC)   MITRAL VALVE REPLACEMENT, HX OF   Atrial fibrillation (HCC)   Acute on chronic respiratory failure with hypoxia (HCC)   Chronic congestive heart failure (HCC)   AKI (acute kidney injury) (HCC)   DM2 (diabetes mellitus, type 2) (HCC)  CAP/Rhinovirus & enterovirus bronchitis Afebrile, with no leukocytosis Urine strep pneumo negative, Legionella pending Respiratory viral panel positive for rhinovirus/enterovirus Blood culture X 2 NGTD Sputum culture pending CT chest showed irregular and somewhat nodular/spiculated foci of airspace disease in the left lower lobe, possibly due to pneumonia however lung neoplasm is also a concern.  CT  follow-up in 4 to 6 weeks recommended Continue IV Rocephin, doxycycline Monitor closely  Acute on chronic hypoxic respiratory failure/COPD exacerbation Oxygen 3 L, steroid-dependent Still requiring about 4L of O2, plan to taper down Likely due to above Continue nebulizers, inhalers, prednisone  AKI on CKD stage III Resolved ?? Prerenal Baseline creatinine around 1.1-1.2, on admission 1.7 Avoid nephrotoxic's, daily BMP  Acute on chronic diastolic HF/pulmonary hypertension/MVR Worsening SOB, BNP slightly rising, likely worsened by pneumonia Chest x-ray with pulmonary vascular congestion Echo showed EF of 60 to 65%, PA peak pressure of 72 mmHg, in A. fib Re-start home Lasix in the a.m Telemetry monitor  Chronic A. Fib Currently rate controlled Continue Cardizem, digoxin, Coumadin Pharmacy to dose Coumadin  DM type 2 Uncontrolled, with hyperglycemia, CBGs 500s on admission Last A1c 7.6 on 12/2017 Likely due to chronic steroid use Not on any regimen at home Start moderate SSI, NovoLog 3 times daily, Accu-Cheks  Hypertension Stable Continue Cardizem  Hyperlipidemia Continue statins      Code Status: Full  Family Communication: Wife at bedside  Disposition Plan: Home once stable, likely 03/07/18   Consultants:  None  Procedures:  None  Antimicrobials:  Rocephin  Doxycycline  DVT prophylaxis: Coumadin   Objective: Vitals:   03/06/18 0435 03/06/18 0726 03/06/18 1315 03/06/18 1620  BP: 138/80  (!) 135/58   Pulse: 73  76 75  Resp: (!) 22  18 18   Temp: 97.8 F (36.6 C)  97.9 F (36.6 C)   TempSrc: Oral  Oral   SpO2: 100% 94% 94% 94%  Weight:      Height:        Intake/Output Summary (Last 24 hours) at 03/06/2018 1856 Last data  filed at 03/06/2018 1500 Gross per 24 hour  Intake 1090 ml  Output -  Net 1090 ml   Filed Weights   03/04/18 2137 03/05/18 0025  Weight: 86.2 kg (190 lb) 84.5 kg (186 lb 4.6 oz)    Exam:   General:  NAD  Cardiovascular: S1, S2 present  Respiratory: Diminished breath sounds bilaterally  Abdomen: Soft, nontender, nondistended, bowel sounds present  Musculoskeletal: Bilateral 1+ pedal edema  Skin: Normal  Psychiatry: Normal mood   Data Reviewed: CBC: Recent Labs  Lab 03/01/18 1125 03/04/18 1803 03/05/18 1606 03/06/18 0427  WBC 11.4* 10.2 10.6* 9.4  NEUTROABS 9.6* 8.8* 8.9* 6.7  HGB 14.0 13.6 13.5 12.2*  HCT 41.9 42.3 41.6 37.9*  MCV 87.5 91.4 88.5 88.8  PLT 133* 170 169 638*   Basic Metabolic Panel: Recent Labs  Lab 03/01/18 1125 03/04/18 1803 03/05/18 1606 03/06/18 0427  NA 134* 134* 138 138  K 3.7 4.5 4.4 4.0  CL 97* 95* 99* 101  CO2 26 23 26 29   GLUCOSE 295* 556* 288* 174*  BUN 17 32* 24* 22*  CREATININE 1.36* 1.70* 1.23 1.11  CALCIUM 8.9 9.1 9.3 8.9   GFR: Estimated Creatinine Clearance: 56.2 mL/min (by C-G formula based on SCr of 1.11 mg/dL). Liver Function Tests: Recent Labs  Lab 03/01/18 1125  AST 20  ALT 18  ALKPHOS 68  BILITOT 1.1  PROT 6.8  ALBUMIN 3.7   No results for input(s): LIPASE, AMYLASE in the last 168 hours. No results for input(s): AMMONIA in the last 168 hours. Coagulation Profile: Recent Labs  Lab 03/04/18 2349 03/06/18 0427  INR 3.38 2.61   Cardiac Enzymes: No results for input(s): CKTOTAL, CKMB, CKMBINDEX, TROPONINI in the last 168 hours. BNP (last 3 results) No results for input(s): PROBNP in the last 8760 hours. HbA1C: No results for input(s): HGBA1C in the last 72 hours. CBG: Recent Labs  Lab 03/05/18 1651 03/05/18 2120 03/06/18 0746 03/06/18 1201 03/06/18 1720  GLUCAP 277* 276* 152* 195* 403*   Lipid Profile: No results for input(s): CHOL, HDL, LDLCALC, TRIG, CHOLHDL, LDLDIRECT in the last 72 hours. Thyroid Function Tests: No results for input(s): TSH, T4TOTAL, FREET4, T3FREE, THYROIDAB in the last 72 hours. Anemia Panel: No results for input(s): VITAMINB12, FOLATE, FERRITIN, TIBC, IRON, RETICCTPCT  in the last 72 hours. Urine analysis:    Component Value Date/Time   COLORURINE YELLOW 11/06/2014 0851   APPEARANCEUR CLEAR 11/06/2014 0851   LABSPEC 1.017 11/06/2014 0851   PHURINE 5.0 11/06/2014 0851   GLUCOSEU NEGATIVE 11/06/2014 0851   HGBUR SMALL (A) 11/06/2014 0851   BILIRUBINUR small 11/27/2014 [place] 11/06/2014 0851   PROTEINUR 100 11/27/2014 1025   PROTEINUR NEGATIVE 11/06/2014 0851   UROBILINOGEN 4.0 11/27/2014 1025   UROBILINOGEN 0.2 11/06/2014 0851   NITRITE negative 11/27/2014 1025   NITRITE NEGATIVE 11/06/2014 0851   LEUKOCYTESUR Negative 11/27/2014 1025   Sepsis Labs: @LABRCNTIP (procalcitonin:4,lacticidven:4)  ) Recent Results (from the past 240 hour(s))  Culture, blood (routine x 2) Call MD if unable to obtain prior to antibiotics being given     Status: None (Preliminary result)   Collection Time: 03/04/18 11:49 PM  Result Value Ref Range Status   Specimen Description   Final    BLOOD RIGHT ANTECUBITAL Performed at [facility], [place] [address] [place], [place] [address]    Special Requests   Final    BOTTLES DRAWN AEROBIC AND ANAEROBIC Blood Culture adequate volume Performed at [facility], [place] [address] [place], [place] [address]    Culture   Final    NO GROWTH 1 DAY Performed at [facility], [place] [address] [place], [place] [address]    Report Status PENDING  Incomplete  Culture, sputum-assessment     Status: None   Collection Time: 03/04/18 11:59 PM  Result Value Ref Range Status   Specimen Description SPUTUM  Final   Special Requests NONE  Final   Sputum evaluation   Final    Sputum specimen not acceptable for testing.  Please recollect.   NOTIFIED H.NJANE,RN [id number] @0226  BY V.WILKINS Performed at [place] [address], [place], [place] [address]    Report Status 03/05/2018 FINAL  Final  Respiratory Panel by PCR     Status: Abnormal   Collection Time:  03/05/18  1:14 AM  Result Value Ref Range Status   Adenovirus NOT DETECTED NOT DETECTED Final   Coronavirus 229E NOT DETECTED NOT DETECTED Final   Coronavirus HKU1 NOT DETECTED NOT DETECTED Final   Coronavirus NL63 NOT DETECTED NOT DETECTED Final   Coronavirus OC43 NOT DETECTED NOT DETECTED Final   Metapneumovirus NOT DETECTED NOT DETECTED Final   Rhinovirus / Enterovirus DETECTED (A) NOT DETECTED Final   Influenza A NOT DETECTED NOT DETECTED Final   Influenza B NOT DETECTED NOT DETECTED Final   Parainfluenza Virus 1 NOT DETECTED NOT DETECTED Final   Parainfluenza Virus 2 NOT DETECTED NOT DETECTED Final   Parainfluenza Virus 3 NOT DETECTED NOT DETECTED Final   Parainfluenza Virus 4 NOT DETECTED NOT DETECTED Final   Respiratory Syncytial Virus NOT DETECTED NOT DETECTED Final   Bordetella pertussis NOT DETECTED NOT DETECTED Final   Chlamydophila pneumoniae NOT DETECTED NOT DETECTED Final   Mycoplasma pneumoniae NOT DETECTED NOT DETECTED Final    Comment: Performed at [facility], [place] [address] [place], [place] [address]  Culture, expectorated sputum-assessment     Status: None   Collection Time: 03/05/18  5:44 AM  Result Value Ref Range Status   Specimen Description EXPECTORATED SPUTUM  Final   Special Requests NONE  Final   Sputum evaluation   Final    THIS SPECIMEN IS ACCEPTABLE FOR SPUTUM CULTURE Performed at [facility], [place] [address], [place], [place] [address]    Report Status 03/05/2018 FINAL  Final  Culture, respiratory (NON-Expectorated)     Status: None (Preliminary result)   Collection Time: 03/05/18  5:44 AM  Result Value Ref Range Status   Specimen Description   Final    EXPECTORATED SPUTUM Performed at [place] [address], [place], [place] [address]    Special Requests   Final    NONE Reflexed from [id number] Performed at [facility], [place] [address] [place], [place] [address]    Gram Stain    Final    FEW WBC PRESENT,BOTH PMN AND MONONUCLEAR MODERATE GRAM POSITIVE COCCI IN PAIRS IN CLUSTERS MODERATE GRAM NEGATIVE COCCOBACILLI RARE YEAST RARE GRAM NEGATIVE RODS    Culture   Final    CULTURE REINCUBATED FOR BETTER GROWTH Performed at [facility], [place] [address] [place], [place] [address]    Report Status PENDING  Incomplete      Studies: No results found.  Scheduled Meds: . albuterol  2.5 mg Nebulization Q4H WA  . arformoterol  15 mcg Nebulization BID  . budesonide  0.5 mg Nebulization BID  . digoxin  0.125 mg  Oral Daily  . diltiazem  240 mg Oral Daily  . guaiFENesin  5 mL Oral Once  . insulin aspart  0-15 Units Subcutaneous TID WC  . insulin aspart  4 Units Subcutaneous TID WC  . pravastatin  40 mg Oral q1800  . predniSONE  40 mg Oral Q breakfast  . umeclidinium bromide  1 puff Inhalation Daily  . Warfarin - Pharmacist Dosing Inpatient   Does not apply q1800    Continuous Infusions: . cefTRIAXone (ROCEPHIN)  IV Stopped (03/05/18 2254)  . doxycycline (VIBRAMYCIN) IV Stopped (03/06/18 1231)     LOS: 2 days     Alma Friendly, MD [facility]  If 7PM-7AM, please contact night-coverage [email or web address] Password [facility] 03/06/2018, 6:56 PM

## 2018-03-07 DIAGNOSIS — N179 Acute kidney failure, unspecified: Secondary | ICD-10-CM

## 2018-03-07 DIAGNOSIS — I10 Essential (primary) hypertension: Secondary | ICD-10-CM

## 2018-03-07 DIAGNOSIS — R0902 Hypoxemia: Secondary | ICD-10-CM

## 2018-03-07 DIAGNOSIS — Z9889 Other specified postprocedural states: Secondary | ICD-10-CM

## 2018-03-07 DIAGNOSIS — E1165 Type 2 diabetes mellitus with hyperglycemia: Secondary | ICD-10-CM

## 2018-03-07 DIAGNOSIS — I482 Chronic atrial fibrillation: Secondary | ICD-10-CM

## 2018-03-07 DIAGNOSIS — J181 Lobar pneumonia, unspecified organism: Secondary | ICD-10-CM

## 2018-03-07 LAB — CBC WITH DIFFERENTIAL/PLATELET
BASOS ABS: 0 10*3/uL (ref 0.0–0.1)
Basophils Relative: 0 %
EOS PCT: 0 %
Eosinophils Absolute: 0 10*3/uL (ref 0.0–0.7)
HEMATOCRIT: 37 % — AB (ref 39.0–52.0)
HEMOGLOBIN: 12 g/dL — AB (ref 13.0–17.0)
LYMPHS ABS: 1.6 10*3/uL (ref 0.7–4.0)
Lymphocytes Relative: 16 %
MCH: 28.5 pg (ref 26.0–34.0)
MCHC: 32.4 g/dL (ref 30.0–36.0)
MCV: 87.9 fL (ref 78.0–100.0)
MONOS PCT: 8 %
Monocytes Absolute: 0.8 10*3/uL (ref 0.1–1.0)
Neutro Abs: 7.9 10*3/uL — ABNORMAL HIGH (ref 1.7–7.7)
Neutrophils Relative %: 76 %
Platelets: 141 10*3/uL — ABNORMAL LOW (ref 150–400)
RBC: 4.21 MIL/uL — ABNORMAL LOW (ref 4.22–5.81)
RDW: 14.9 % (ref 11.5–15.5)
WBC: 10.3 10*3/uL (ref 4.0–10.5)

## 2018-03-07 LAB — BASIC METABOLIC PANEL
Anion gap: 7 (ref 5–15)
BUN: 24 mg/dL — ABNORMAL HIGH (ref 6–20)
CALCIUM: 8.9 mg/dL (ref 8.9–10.3)
CHLORIDE: 103 mmol/L (ref 101–111)
CO2: 29 mmol/L (ref 22–32)
CREATININE: 1.21 mg/dL (ref 0.61–1.24)
GFR calc Af Amer: >60 mL/min (ref >60)
GFR calc non Af Amer: 55 mL/min — ABNORMAL LOW (ref >60)
GLUCOSE: 197 mg/dL — AB (ref 65–99)
Potassium: 4.2 mmol/L (ref 3.5–5.1)
Sodium: 139 mmol/L (ref 135–145)

## 2018-03-07 LAB — PROTIME-INR
INR: 1.77
Prothrombin Time: 20.5 seconds — ABNORMAL HIGH (ref 11.4–15.2)

## 2018-03-07 LAB — CULTURE, RESPIRATORY

## 2018-03-07 LAB — GLUCOSE, CAPILLARY
Glucose-Capillary: 190 mg/dL — ABNORMAL HIGH (ref 65–99)
Glucose-Capillary: 182 mg/dL — ABNORMAL HIGH (ref 65–99)
Glucose-Capillary: 204 mg/dL — ABNORMAL HIGH (ref 65–99)

## 2018-03-07 LAB — CULTURE, RESPIRATORY W GRAM STAIN: Culture: NORMAL

## 2018-03-07 MED ORDER — FUROSEMIDE 40 MG PO TABS
40.0000 mg | ORAL_TABLET | Freq: Every day | ORAL | 0 refills | Status: DC
Start: 2018-03-08 — End: 2018-07-17

## 2018-03-07 MED ORDER — ENOXAPARIN SODIUM 80 MG/0.8ML ~~LOC~~ SOLN
80.0000 mg | Freq: Two times a day (BID) | SUBCUTANEOUS | Status: DC
  Administered 2018-03-07: 80 mg via SUBCUTANEOUS
  Filled 2018-03-07: qty 0.8

## 2018-03-07 MED ORDER — DOXYCYCLINE HYCLATE 100 MG PO TABS: 100.0000 mg | ORAL_TABLET | Freq: Two times a day (BID) | ORAL | Status: DC

## 2018-03-07 MED ORDER — WARFARIN SODIUM 5 MG PO TABS
5.0000 mg | ORAL_TABLET | Freq: Once | ORAL | Status: AC
Start: 2018-03-07 — End: 2018-03-07
  Administered 2018-03-07: 5 mg via ORAL
  Filled 2018-03-07: qty 1

## 2018-03-07 MED ORDER — ALBUTEROL SULFATE (2.5 MG/3ML) 0.083% IN NEBU: 2.5000 mg | INHALATION_SOLUTION | RESPIRATORY_TRACT | Status: DC | PRN

## 2018-03-07 MED ORDER — FUROSEMIDE 40 MG PO TABS
40.0000 mg | ORAL_TABLET | Freq: Once | ORAL | Status: AC
  Administered 2018-03-07: 40 mg via ORAL
  Filled 2018-03-07: qty 1

## 2018-03-07 NOTE — Progress Notes (Signed)
[place] for Warfarin Indication: atrial fibrillation  No Known Allergies  Patient Measurements: Height: 5\' 8"  (172.7 cm) Weight: 186 lb 4.6 oz (84.5 kg) IBW/kg (Calculated) : 68.4   Vital Signs: Temp: 97.5 F (36.4 C) (06/19 0836) Temp Source: Oral (06/19 0836) BP: 127/52 (06/19 0836) Pulse Rate: 88 (06/19 0836)  Labs: Recent Labs    03/04/18 2349 03/05/18 1606 03/06/18 0427 03/07/18 0421  HGB  --  13.5 12.2* 12.0*  HCT  --  41.6 37.9* 37.0*  PLT  --  169 143* 141*  LABPROT 33.9*  --  27.7* 20.5*  INR 3.38  --  2.61 1.77  CREATININE  --  1.23 1.11 1.21    Estimated Creatinine Clearance: 51.5 mL/min (by C-G formula based on SCr of 1.21 mg/dL).  Medications:  Scheduled:  . albuterol  2.5 mg Nebulization Q4H WA  . arformoterol  15 mcg Nebulization BID  . budesonide  0.5 mg Nebulization BID  . digoxin  0.125 mg Oral Daily  . diltiazem  240 mg Oral Daily  . furosemide  40 mg Oral Daily  . guaiFENesin  5 mL Oral Once  . insulin aspart  0-15 Units Subcutaneous TID WC  . insulin aspart  4 Units Subcutaneous TID WC  . pravastatin  40 mg Oral q1800  . predniSONE  40 mg Oral Q breakfast  . umeclidinium bromide  1 puff Inhalation Daily  . Warfarin - Pharmacist Dosing Inpatient   Does not apply q1800   Infusions:  . cefTRIAXone (ROCEPHIN)  IV Stopped (03/06/18 2216)  . doxycycline (VIBRAMYCIN) IV Stopped (03/07/18 0037)    Assessment: 45 yoM with hx of COPD steroid and O2 dependent, mitral valve repair/replacement c/o of SOB. Patient on chronic warfarin for a-fib; goal INR 2-3 per [facility] flowsheet. Pharmacy to dose warfarin while admitted.   Baseline INR elevated  Prior anticoagulation: warfarin 2.5 MWF and 5 mg all other days; LD 6/15  Significant events:  Today, 03/07/2018:  CBC: Hgb lower but stable; Plt slightly low  INR now SUBtherapeutic  Major drug interactions: none  No bleeding issues per nursing  Eating 100%  of meals  Goal of Therapy: INR 2-3  Plan:  Warfarin 5 mg PO x 1 today, will give this AM as patient is subtherapeutic  Spoke with MD; not entirely clear what extent of MV replacement is; all I can find are records of annuloplasty and valve repair. 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

## 2018-03-07 NOTE — Progress Notes (Signed)
Pharmacy IV to PO conversion  This patient is receiving doxycycline by the intravenous route. Based on criteria approved by the Pharmacy and Therapeutics Committee, and the Infectious Disease Division, the antibiotic(s) is/are being converted to equivalent oral dose form(s). These criteria include:   Patient being treated for a respiratory tract infection, urinary tract infection, cellulitis, or Clostridium Difficile Associated Diarrhea  The patient is not neutropenic and does not exhibit a GI malabsorption state  The patient is eating (either orally or per tube) and/or has been taking other orally administered medications for at least 24 hours.  The patient is improving clinically (physician assessment and a 24-hour Tmax of <=100.5 F)  If you have any questions about this conversion, please contact the Pharmacy Department (ext [phone]).  Thank you.  Reuel Boom, PharmD, BCPS [phone] 03/07/2018, 10:14 AM

## 2018-03-07 NOTE — Care Management Important Message (Signed)
Important Message  Patient Details  Name: JABARI SWOVELAND MRN: [id number] Date of Birth: [birth year]   Medicare Important Message Given:  Yes    Kerin Salen 03/07/2018, 10:54 AMImportant Message  Patient Details  Name: MEKO MASTERSON MRN: [id number] Date of Birth: [birth year]   Medicare Important Message Given:  Yes    Kerin Salen 03/07/2018, 10:54 AM

## 2018-03-07 NOTE — Care Management Note (Signed)
Case Management Note  Patient Details  Name: [name] MRN: [id number] Date of Birth: May 22, 1936  Subjective/Objective:                    Action/Plan:   Expected Discharge Date:  03/07/18               Expected Discharge Plan:  [place]  In-House Referral:     Discharge planning Services  CM Consult  Post Acute Care Choice:    Choice offered to:  Patient, Spouse  DME Arranged:  Oxygen DME Agency:  [facility]  HH Arranged:  RN, PT [place] Agency:  [facility]  Status of Service:  Completed, signed off  If discussed at [place] of Stay Meetings, dates discussed:    Additional CommentsPurcell Mouton, RN 03/07/2018, 3:26 PM

## 2018-03-07 NOTE — Discharge Summary (Addendum)
Physician Discharge Summary  [name] JGG:[id number] DOB: 1935-10-10 DOA: 03/04/2018  PCP: Tammi Sou, MD  Admit date: 03/04/2018 Discharge date: 03/07/2018  Admitted From: Home  Disposition:  Home   Recommendations for Outpatient Follow-up and new medication changes:  1. Follow up with Dr. Anitra Lauth in 7 days 2. Follow-up CT chest in 4 to 6 weeks, on nodular/spiculated foci of airspace disease in the left lower lobe.  Home Health: Yes   Equipment/Devices: no    Discharge Condition: stable  CODE STATUS: full  Diet recommendation: Heart healthy and diabetic prudent.   Brief/Interim Summary: 82 year old male who presented with dyspnea.  He does have the significant past medical history for COPD with chronic hypoxic respiratory failure, (3 L/min of supplemental oxygen), coronary artery disease, congestive heart failure, mitral valve repair, atrial fibrillation, hypertension and type 2 diabetes mellitus.  Patient was recently diagnosed with left lower lobe pneumonia, and took 3 days of antibiotics along with increased dose of steroids.  Wheezing improved, but he had persistent dyspnea.  Physical examination blood pressure 154/69, heart rate 69, respiratory 28, oxygen saturation 93%.  Moist mucous membranes, diffuse wheezing bilaterally, heart, S1-S2 present, irregularly irregular, abdomen protuberant, nontender nondistended, positive lower extremity edema, pitting bilaterally.  Sodium 134, potassium 4.5, chloride 95, bicarb 23, glucose 556, BUN 32, creatinine 1.70, BNP 144, white count 10.2, 1113.6, hematocrit 42.3, platelets 170, INR 3.3, chest x-ray hypoinflated with bibasilar atelectasis.  CT chest with irregular nodular/spiculated foci of airspace disease in the left lower lobe.  Moderate emphysema.  EKG with atrial fibrillation, left axis deviation, right bundle branch block.  Patient was admitted to the hospital with working diagnosis of acute on chronic hypoxic respiratory failure  due to COPD exacerbation in the setting of left lower committee acquired pneumonia.  1.  Acute on chronic hypoxic respiratory failure due to COPD exacerbation in the setting of left lower lobe viral pneumonia due to rhinovirus/enterovirus.  Patient was admitted to the medical ward, he received aggressive bronchodilator therapy, IV systemic steroids, supplemental oxygen per nasal cannula and close oximetry monitoring.  Responded well to the medical therapy, he was deemed stable to be discharged March 07, 2018.  At discharge oximetry 92% on 4 L per nasal cannula of supplemental oxygen.  Continue bronchodilator therapy with albuterol, tiotropium/olodaterol, and 20 mg of daily prednisone.  Continue inhaled budesonide.  Patient received antibiotic therapy with IV ceftriaxone and doxycycline, respiratory panel was positive for rhinovirus and enterovirus, suggesting a viral pneumonia.  Further antibiotic will be discontinued.   2.  Acute on chronic diastolic heart failure decompensation.  Patient was diuresed with furosemide, with improvement of his symptoms, he received 80 mg of oral furosemide on the day of discharge, continue home health services.  His echocardiography showed left ventricle ejection fraction 60 to 65%.  Mitral valve with elevated gradients, mean 14 mmHg, peak 25 mmHg.   3.  Chronic atrial fibrillation.  Tinea rate control with diltiazem and digoxin, anticoagulation with warfarin, discharge INR 1.77, target INR between 2 and 3.  4.  Type 2 diabetes mellitus.  Based on insulin sliding scale for glucose coverage and monitoring, fluctuating capillary glucose between 182 and 403.  Patient will continue prednisone 20 mg daily, close follow-up of capillary glucose at home.   Discharge Diagnoses:  Principal Problem:   Community acquired pneumonia of left lower lobe of lung ([place]) Active Problems:   Essential hypertension, benign   COPD (chronic obstructive pulmonary disease) with emphysema ([place])  MITRAL VALVE REPLACEMENT, HX OF   Atrial fibrillation (H[facility])   Acute on chronic respiratory failure with hypoxia (HCC)   Chronic congestive heart failure (HCC)   AKI (acute kidney injury) ([place])   DM2 (diabetes mellitus, type 2) ([place])    Discharge Instructions   Allergies as of 03/07/2018   No Known Allergies     Medication List    TAKE these medications   albuterol (2.5 MG/3ML) 0.083% nebulizer solution Commonly known as:  PROVENTIL Take 3 mLs (2.5 mg total) by nebulization every 4 (four) hours. And as needed   albuterol 108 (90 Base) MCG/ACT inhaler Commonly known as:  PROVENTIL HFA;VENTOLIN HFA INHALE ONE PUFF BY MOUTH EVERY 6 HOURS AS NEEDED FOR WHEEZING AND FOR SHORTNESS OF BREATH (USING 5-6 TIMES EVERY 24 HOURS)   budesonide 0.5 MG/2ML nebulizer solution Commonly known as:  PULMICORT USE ONE VIAL IN NEBULIZER TWICE DAILY   digoxin 0.125 MG tablet Commonly known as:  LANOXIN Take 1 tablet (0.125 mg total) by mouth daily.   diltiazem 240 MG 24 hr capsule Commonly known as:  CARDIZEM CD Take 1 capsule (240 mg total) by mouth daily.   furosemide 40 MG tablet Commonly known as:  LASIX Take 1 tablet (40 mg total) by mouth daily. Start taking on:  03/08/2018   LORazepam 0.5 MG tablet Commonly known as:  ATIVAN Take 0.5 mg by mouth as needed for anxiety.   pravastatin 40 MG tablet Commonly known as:  PRAVACHOL TAKE 1 TABLET EVERY EVENING What changed:    how much to take  how to take this  when to take this  additional instructions   predniSONE 20 MG tablet Commonly known as:  DELTASONE Take 20 mg by mouth daily with breakfast.   STIOLTO RESPIMAT 2.5-2.5 MCG/ACT [place] Generic drug:  Tiotropium Bromide-Olodaterol INHALE 2 PUFFS INTO THE LUNGS DAILY.   warfarin 5 MG tablet Commonly known as:  COUMADIN Take as directed. If you are unsure how to take this medication, talk to your nurse or doctor. Original instructions:  Take 0.5-1 tablets (2.5-5 mg total)  by mouth See admin instructions. 5mg  daily except Friday take 2.5mg  AS DIRECTED BY COUMADIN CLINIC What changed:    when to take this  additional instructions       No Known Allergies  Consultations:     Procedures/Studies: Dg Chest 2 View  Result Date: 03/01/2018 CLINICAL DATA:  Short of breath, cough for 2-3 days, some swelling of the feet EXAM: CHEST - 2 VIEW COMPARISON:  Chest x-ray of 10/30/2017 FINDINGS: Prominent markings remain bilaterally some which may be chronic, but edema cannot be excluded. Also there are more prominent markings at the left lung base and pneumonia is a definite consideration. No definite pleural effusion is seen. Mild cardiomegaly is stable with mitral valve replacement and median sternotomy sutures noted. No bony abnormality is seen. IMPRESSION: 1. Prominent markings may indicate an element of interstitial edema. No effusion. 2. Markings at the left lung base are focally prominent and pneumonia is a definite consideration. Electronically Signed   By: Ivar Drape M.D.   On: 03/01/2018 11:37   Ct Chest W Contrast  Result Date: 03/04/2018 CLINICAL DATA:  Short of breath EXAM: CT CHEST WITH CONTRAST TECHNIQUE: Multidetector CT imaging of the chest was performed during intravenous contrast administration. CONTRAST:  25mL ISOVUE-300 IOPAMIDOL (ISOVUE-300) INJECTION 61% COMPARISON:  Chest x-ray 03/04/2018, CT chest 12/14/2009 FINDINGS: Cardiovascular: Nonaneurysmal aorta. Moderate aortic atherosclerosis. Coronary vascular calcification. Borderline heart size. No significant  pericardial effusion. Mitral valve prosthesis. Mediastinum/Nodes: Midline trachea. No thyroid mass. Stable scattered mediastinal lymph nodes. Largest is seen in the subcarinal region and measures 14 mm. Esophagus within normal limits Lungs/Pleura: Moderate emphysema. Decreased size of right upper lobe pulmonary nodule now measuring 4 mm compared to 6 mm prior. Interim finding of slightly spiculated  nodular foci of airspace disease in the left lower lobe, measuring 14 mm posteriorly and 15 mm anteriorly. No pleural effusion Upper Abdomen: Stable to decreased size of left adrenal nodule/adenoma now measuring 19 mm. No acute abnormality in the upper abdomen. Musculoskeletal: Post sternotomy changes. No acute or suspicious bony abnormality. IMPRESSION: 1. Irregular and somewhat nodular/spiculated foci of airspace disease in the left lower lobe, possibly due to pneumonia, however lung neoplasm is also a concern. Short interval CT follow-up in 4-6 weeks following antibiotic trial is suggested. 2. Moderate emphysema 3. Decreased size of left adrenal gland adenoma. Aortic Atherosclerosis (ICD10-I70.0) and Emphysema (ICD10-J43.9). Electronically Signed   By: Donavan Foil M.D.   On: 03/04/2018 21:06   Dg Chest Port 1 View  Result Date: 03/04/2018 CLINICAL DATA:  Shortness of breath. EXAM: PORTABLE CHEST 1 VIEW COMPARISON:  03/01/2018 FINDINGS: Cardiac enlargement with aortic atherosclerosis noted. No pleural effusions identified. Pulmonary vascular congestion and bibasilar atelectasis noted. Asymmetric opacity in the left base may represent pneumonia. IMPRESSION: 1. Bibasilar atelectasis with asymmetric left base opacity which may represent pneumonia. 2. Cardiac enlargement with aortic atherosclerosis. Aortic Atherosclerosis (ICD10-I70.0). 3. Pulmonary vascular congestion. Electronically Signed   By: Kerby Moors M.D.   On: 03/04/2018 18:20       Subjective: Patient is feeling better, dyspnea has improved, no nausea or vomiting, no chest pain. Positive lower extremity edema, a baseline.   Discharge Exam: Vitals:   03/07/18 0907 03/07/18 1200  BP:    Pulse:    Resp:    Temp:    SpO2: 91% 92%   Vitals:   03/07/18 0513 03/07/18 0836 03/07/18 0907 03/07/18 1200  BP: 118/78 (!) 127/52    Pulse: 80 88    Resp: 16 17    Temp: (!) 97.5 F (36.4 C) (!) 97.5 F (36.4 C)    TempSrc: Oral Oral     SpO2: 98% 92% 91% 92%  Weight:      Height:        General: not in pain or dyspnea, deconditioned  Neurology: Awake and alert, non focal  E ENT: mild pallor, no icterus, oral mucosa moist Cardiovascular: No JVD. S1-S2 present, rhythmic, no gallops, rubs, or murmurs. ++ pitting bilateral lower extremity edema. Pulmonary: vesicular breath sounds bilaterally, adequate air movement, no wheezing, rhonchi or rales. Gastrointestinal. Abdomen protuberant with no organomegaly, non tender, no rebound or guarding Skin. No rashes Musculoskeletal: no joint deformities   The results of significant diagnostics from this hospitalization (including imaging, microbiology, ancillary and laboratory) are listed below for reference.     Microbiology: Recent Results (from the past 240 hour(s))  Culture, blood (routine x 2) Call MD if unable to obtain prior to antibiotics being given     Status: None (Preliminary result)   Collection Time: 03/04/18 11:49 PM  Result Value Ref Range Status   Specimen Description   Final    BLOOD RIGHT ANTECUBITAL Performed at [facility] [address] [place], [place] [address]    Special Requests   Final    BOTTLES DRAWN AEROBIC AND ANAEROBIC Blood Culture adequate volume Performed at [facility]  [address], [place], [place] [address]    Culture   Final    NO GROWTH 1 DAY Performed at [facility], [place] [address] [place], [place] [address]    Report Status PENDING  Incomplete  Culture, sputum-assessment     Status: None   Collection Time: 03/04/18 11:59 PM  Result Value Ref Range Status   Specimen Description SPUTUM  Final   Special Requests NONE  Final   Sputum evaluation   Final    Sputum specimen not acceptable for testing.  Please recollect.   NOTIFIED H.NJANE,RN [id number] @0226  BY V.WILKINS Performed at [facility] [address] [place], [place] [address]    Report Status  03/05/2018 FINAL  Final  Respiratory Panel by PCR     Status: Abnormal   Collection Time: 03/05/18  1:14 AM  Result Value Ref Range Status   Adenovirus NOT DETECTED NOT DETECTED Final   Coronavirus 229E NOT DETECTED NOT DETECTED Final   Coronavirus HKU1 NOT DETECTED NOT DETECTED Final   Coronavirus NL63 NOT DETECTED NOT DETECTED Final   Coronavirus OC43 NOT DETECTED NOT DETECTED Final   Metapneumovirus NOT DETECTED NOT DETECTED Final   Rhinovirus / Enterovirus DETECTED (A) NOT DETECTED Final   Influenza A NOT DETECTED NOT DETECTED Final   Influenza B NOT DETECTED NOT DETECTED Final   Parainfluenza Virus 1 NOT DETECTED NOT DETECTED Final   Parainfluenza Virus 2 NOT DETECTED NOT DETECTED Final   Parainfluenza Virus 3 NOT DETECTED NOT DETECTED Final   Parainfluenza Virus 4 NOT DETECTED NOT DETECTED Final   Respiratory Syncytial Virus NOT DETECTED NOT DETECTED Final   Bordetella pertussis NOT DETECTED NOT DETECTED Final   Chlamydophila pneumoniae NOT DETECTED NOT DETECTED Final   Mycoplasma pneumoniae NOT DETECTED NOT DETECTED Final    Comment: Performed at [facility], [place] [address], [place], [place] [address]  Culture, expectorated sputum-assessment     Status: None   Collection Time: 03/05/18  5:44 AM  Result Value Ref Range Status   Specimen Description EXPECTORATED SPUTUM  Final   Special Requests NONE  Final   Sputum evaluation   Final    THIS SPECIMEN IS ACCEPTABLE FOR SPUTUM CULTURE Performed at [facility], [place] [address], [place], [place] [address]    Report Status 03/05/2018 FINAL  Final  Culture, respiratory (NON-Expectorated)     Status: None   Collection Time: 03/05/18  5:44 AM  Result Value Ref Range Status   Specimen Description   Final    EXPECTORATED SPUTUM Performed at [facility], [place] [address] [place], [place] [address]    Special Requests   Final    NONE Reflexed from [id number] Performed at [facility], [place] [address] [place], [place] [address]    Gram Stain   Final    FEW WBC PRESENT,BOTH PMN AND MONONUCLEAR MODERATE GRAM POSITIVE COCCI IN PAIRS IN CLUSTERS MODERATE GRAM NEGATIVE COCCOBACILLI RARE YEAST RARE GRAM NEGATIVE RODS    Culture   Final    FEW Consistent with normal respiratory flora. Performed at [facility], [place] [address], [place], [place] [address]    Report Status 03/07/2018 FINAL  Final     Labs: BNP (last 3 results) Recent Labs    10/30/17 1457 03/01/18 1118 03/04/18 1803  BNP 91.4 107.1* 673.4*   Basic Metabolic Panel: Recent Labs  Lab 03/01/18 1125 03/04/18 1803 03/05/18 1606 03/06/18 0427 03/07/18 0421  NA  134* 134* 138 138 139  K 3.7 4.5 4.4 4.0 4.2  CL 97* 95* 99* 101 103  CO2 26 23 26 29 29   GLUCOSE 295* 556* 288* 174* 197*  BUN 17 32* 24* 22* 24*  CREATININE 1.36* 1.70* 1.23 1.11 1.21  CALCIUM 8.9 9.1 9.3 8.9 8.9   Liver Function Tests: Recent Labs  Lab 03/01/18 1125  AST 20  ALT 18  ALKPHOS 68  BILITOT 1.1  PROT 6.8  ALBUMIN 3.7   No results for input(s): LIPASE, AMYLASE in the last 168 hours. No results for input(s): AMMONIA in the last 168 hours. CBC: Recent Labs  Lab 03/01/18 1125 03/04/18 1803 03/05/18 1606 03/06/18 0427 03/07/18 0421  WBC 11.4* 10.2 10.6* 9.4 10.3  NEUTROABS 9.6* 8.8* 8.9* 6.7 7.9*  HGB 14.0 13.6 13.5 12.2* 12.0*  HCT 41.9 42.3 41.6 37.9* 37.0*  MCV 87.5 91.4 88.5 88.8 87.9  PLT 133* 170 169 143* 141*   Cardiac Enzymes: No results for input(s): CKTOTAL, CKMB, CKMBINDEX, TROPONINI in the last 168 hours. BNP: Invalid input(s): POCBNP CBG: Recent Labs  Lab 03/06/18 1720 03/06/18 2140 03/07/18 0518 03/07/18 0738 03/07/18 1153  GLUCAP 403* 206* 190* 182* 204*   D-Dimer No results for input(s): DDIMER in the last 72 hours. Hgb A1c No results for input(s): HGBA1C in the last 72 hours. Lipid Profile No results for input(s): CHOL, HDL, LDLCALC, TRIG, CHOLHDL, LDLDIRECT in  the last 72 hours. Thyroid function studies No results for input(s): TSH, T4TOTAL, T3FREE, THYROIDAB in the last 72 hours.  Invalid input(s): FREET3 Anemia work up No results for input(s): VITAMINB12, FOLATE, FERRITIN, TIBC, IRON, RETICCTPCT in the last 72 hours. Urinalysis    Component Value Date/Time   COLORURINE YELLOW 11/06/2014 [place] 11/06/2014 0851   LABSPEC 1.017 11/06/2014 0851   PHURINE 5.0 11/06/2014 0851   GLUCOSEU NEGATIVE 11/06/2014 0851   HGBUR SMALL (A) 11/06/2014 0851   BILIRUBINUR small 11/27/2014 1025   KETONESUR NEGATIVE 11/06/2014 0851   PROTEINUR 100 11/27/2014 1025   PROTEINUR NEGATIVE 11/06/2014 0851   UROBILINOGEN 4.0 11/27/2014 1025   UROBILINOGEN 0.2 11/06/2014 0851   NITRITE negative 11/27/2014 1025   NITRITE NEGATIVE 11/06/2014 0851   LEUKOCYTESUR Negative 11/27/2014 1025   Sepsis Labs Invalid input(s): PROCALCITONIN,  WBC,  LACTICIDVEN Microbiology Recent Results (from the past 240 hour(s))  Culture, blood (routine x 2) Call MD if unable to obtain prior to antibiotics being given     Status: None (Preliminary result)   Collection Time: 03/04/18 11:49 PM  Result Value Ref Range Status   Specimen Description   Final    BLOOD RIGHT ANTECUBITAL Performed at [facility], [place] [address], [place], [place] [address]    Special Requests   Final    BOTTLES DRAWN AEROBIC AND ANAEROBIC Blood Culture adequate volume Performed at [facility] [address], [place], [place] [address]    Culture   Final    NO GROWTH 1 DAY Performed at [facility], [place] [address], [place], [place] [address]    Report Status PENDING  Incomplete  Culture, sputum-assessment     Status: None   Collection Time: 03/04/18 11:59 PM  Result Value Ref Range Status   Specimen Description SPUTUM  Final   Special Requests NONE  Final   Sputum evaluation   Final    Sputum specimen not acceptable for testing.  Please  recollect.   NOTIFIED H.NJANE,RN [id number] @0226  BY V.WILKINS  Performed at [facility], [place] [address] [place], [place] [address]    Report Status 03/05/2018 FINAL  Final  Respiratory Panel by PCR     Status: Abnormal   Collection Time: 03/05/18  1:14 AM  Result Value Ref Range Status   Adenovirus NOT DETECTED NOT DETECTED Final   Coronavirus 229E NOT DETECTED NOT DETECTED Final   Coronavirus HKU1 NOT DETECTED NOT DETECTED Final   Coronavirus NL63 NOT DETECTED NOT DETECTED Final   Coronavirus OC43 NOT DETECTED NOT DETECTED Final   Metapneumovirus NOT DETECTED NOT DETECTED Final   Rhinovirus / Enterovirus DETECTED (A) NOT DETECTED Final   Influenza A NOT DETECTED NOT DETECTED Final   Influenza B NOT DETECTED NOT DETECTED Final   Parainfluenza Virus 1 NOT DETECTED NOT DETECTED Final   Parainfluenza Virus 2 NOT DETECTED NOT DETECTED Final   Parainfluenza Virus 3 NOT DETECTED NOT DETECTED Final   Parainfluenza Virus 4 NOT DETECTED NOT DETECTED Final   Respiratory Syncytial Virus NOT DETECTED NOT DETECTED Final   Bordetella pertussis NOT DETECTED NOT DETECTED Final   Chlamydophila pneumoniae NOT DETECTED NOT DETECTED Final   Mycoplasma pneumoniae NOT DETECTED NOT DETECTED Final    Comment: Performed at [facility], [place] [address], [place], [place] [address]  Culture, expectorated sputum-assessment     Status: None   Collection Time: 03/05/18  5:44 AM  Result Value Ref Range Status   Specimen Description EXPECTORATED SPUTUM  Final   Special Requests NONE  Final   Sputum evaluation   Final    THIS SPECIMEN IS ACCEPTABLE FOR SPUTUM CULTURE Performed at [facility], [place] [address] [place], [place] [address]    Report Status 03/05/2018 FINAL  Final  Culture, respiratory (NON-Expectorated)     Status: None   Collection Time: 03/05/18  5:44 AM  Result Value Ref Range Status   Specimen Description   Final    EXPECTORATED SPUTUM Performed at  [facility], [place] [address] [place], [place] [address]    Special Requests   Final    NONE Reflexed from [phone] Performed at [facility], [place] [address] [place], [place] [address]    Gram Stain   Final    FEW WBC PRESENT,BOTH PMN AND MONONUCLEAR MODERATE GRAM POSITIVE COCCI IN PAIRS IN CLUSTERS MODERATE GRAM NEGATIVE COCCOBACILLI RARE YEAST RARE GRAM NEGATIVE RODS    Culture   Final    FEW Consistent with normal respiratory flora. Performed at [facility], [place] [address] [place],  [address]    Report Status 03/07/2018 FINAL  Final     Time coordinating discharge: 45 minutes  SIGNED:   Tawni Millers, MD  [facility] 03/07/2018, 12:20 PM Pager [phone]  If 7PM-7AM, please contact night-coverage [email or web address] Password [email or web address]

## 2018-03-08 ENCOUNTER — Telehealth: Payer: Self-pay | Admitting: Family Medicine

## 2018-03-08 ENCOUNTER — Other Ambulatory Visit: Payer: Self-pay | Admitting: Family Medicine

## 2018-03-08 DIAGNOSIS — E1122 Type 2 diabetes mellitus with diabetic chronic kidney disease: Secondary | ICD-10-CM | POA: Diagnosis not present

## 2018-03-08 DIAGNOSIS — J9621 Acute and chronic respiratory failure with hypoxia: Secondary | ICD-10-CM | POA: Diagnosis not present

## 2018-03-08 DIAGNOSIS — E785 Hyperlipidemia, unspecified: Secondary | ICD-10-CM | POA: Diagnosis not present

## 2018-03-08 DIAGNOSIS — Z7901 Long term (current) use of anticoagulants: Secondary | ICD-10-CM | POA: Diagnosis not present

## 2018-03-08 DIAGNOSIS — Z7984 Long term (current) use of oral hypoglycemic drugs: Secondary | ICD-10-CM | POA: Diagnosis not present

## 2018-03-08 DIAGNOSIS — I2510 Atherosclerotic heart disease of native coronary artery without angina pectoris: Secondary | ICD-10-CM | POA: Diagnosis not present

## 2018-03-08 DIAGNOSIS — I130 Hypertensive heart and chronic kidney disease with heart failure and stage 1 through stage 4 chronic kidney disease, or unspecified chronic kidney disease: Secondary | ICD-10-CM | POA: Diagnosis not present

## 2018-03-08 DIAGNOSIS — Z7952 Long term (current) use of systemic steroids: Secondary | ICD-10-CM | POA: Diagnosis not present

## 2018-03-08 DIAGNOSIS — I5032 Chronic diastolic (congestive) heart failure: Secondary | ICD-10-CM | POA: Diagnosis not present

## 2018-03-08 DIAGNOSIS — J440 Chronic obstructive pulmonary disease with acute lower respiratory infection: Secondary | ICD-10-CM | POA: Diagnosis not present

## 2018-03-08 DIAGNOSIS — J189 Pneumonia, unspecified organism: Secondary | ICD-10-CM | POA: Diagnosis not present

## 2018-03-08 DIAGNOSIS — Z8546 Personal history of malignant neoplasm of prostate: Secondary | ICD-10-CM | POA: Diagnosis not present

## 2018-03-08 DIAGNOSIS — Z9981 Dependence on supplemental oxygen: Secondary | ICD-10-CM | POA: Diagnosis not present

## 2018-03-08 DIAGNOSIS — I4891 Unspecified atrial fibrillation: Secondary | ICD-10-CM | POA: Diagnosis not present

## 2018-03-08 DIAGNOSIS — N183 Chronic kidney disease, stage 3 (moderate): Secondary | ICD-10-CM | POA: Diagnosis not present

## 2018-03-08 LAB — LEGIONELLA PNEUMOPHILA SEROGP 1 UR AG: L. PNEUMOPHILA SEROGP 1 UR AG: NEGATIVE

## 2018-03-08 MED ORDER — GLIPIZIDE ER 2.5 MG PO TB24: 2.5000 mg | ORAL_TABLET | Freq: Every day | ORAL | 1 refills | Status: DC

## 2018-03-08 NOTE — Telephone Encounter (Signed)
Yes, I want him on the glipizide. I eRx'd this med just now.

## 2018-03-08 NOTE — Telephone Encounter (Signed)
Please advise. Thanks.  

## 2018-03-08 NOTE — Telephone Encounter (Signed)
Malachy Mood advised and voiced understanding, she will contact pt to advise him.

## 2018-03-08 NOTE — Telephone Encounter (Signed)
Malachy Mood nurse with [facility] cold transferred to office.  She reports patient was recently treated in hospital for pneumonia.  His blood sugar level was 147 this morning.  Does pcp want patient to continue taking glyburide.  If so, he will need rx sent to [facility] in [place].  Please return call to [place] at [phone].

## 2018-03-09 ENCOUNTER — Telehealth: Payer: Self-pay

## 2018-03-09 NOTE — Telephone Encounter (Signed)
Noted: nurse phone contact with patient for TCM. Signed:  Crissie Sickles, MD           03/09/2018

## 2018-03-09 NOTE — Telephone Encounter (Signed)
Transition Care Management Follow-up Telephone Call  Admit date: 03/04/2018 Discharge date: 03/07/2018 Principal Problem:  Community acquired pneumonia of left lower lobe of lung ([place])   How have you been since you were released from the hospital? "I still can't breathe, but it's better than it was". States he is currently on 3L of Oxygen.    Do you understand why you were in the hospital? yes   Do you understand the discharge instructions? yes   Where were you discharged to? Home. Lives with wife.    Items Reviewed:  Medications reviewed: no, pt did not have medications at time of call. Stated all meds stayed the same with the addition of antibiotic and prednisone.   Allergies reviewed: yes  Dietary changes reviewed: yes  Referrals reviewed: yes   Functional Questionnaire:   Activities of Daily Living (ADLs):   He states they are independent in the following: ambulation, bathing and hygiene, feeding, continence, grooming, toileting and dressing States they require assistance with the following: None.    Any transportation issues/concerns?: no   Any patient concerns? no   Confirmed importance and date/time of follow-up visits scheduled yes  Provider Appointment booked with PCP on Thursday, 03/15/18.   Confirmed with patient if condition begins to worsen call PCP or go to the ER.  Patient was given the office number and encouraged to call back with question or concerns.  : yes

## 2018-03-10 LAB — CULTURE, BLOOD (ROUTINE X 2)
CULTURE: NO GROWTH
SPECIAL REQUESTS: ADEQUATE

## 2018-03-12 ENCOUNTER — Other Ambulatory Visit: Payer: Self-pay | Admitting: Family Medicine

## 2018-03-12 ENCOUNTER — Telehealth: Payer: Self-pay | Admitting: Family Medicine

## 2018-03-12 DIAGNOSIS — J9621 Acute and chronic respiratory failure with hypoxia: Secondary | ICD-10-CM | POA: Diagnosis not present

## 2018-03-12 DIAGNOSIS — I130 Hypertensive heart and chronic kidney disease with heart failure and stage 1 through stage 4 chronic kidney disease, or unspecified chronic kidney disease: Secondary | ICD-10-CM | POA: Diagnosis not present

## 2018-03-12 DIAGNOSIS — I2510 Atherosclerotic heart disease of native coronary artery without angina pectoris: Secondary | ICD-10-CM | POA: Diagnosis not present

## 2018-03-12 DIAGNOSIS — J440 Chronic obstructive pulmonary disease with acute lower respiratory infection: Secondary | ICD-10-CM | POA: Diagnosis not present

## 2018-03-12 DIAGNOSIS — E1122 Type 2 diabetes mellitus with diabetic chronic kidney disease: Secondary | ICD-10-CM | POA: Diagnosis not present

## 2018-03-12 DIAGNOSIS — J189 Pneumonia, unspecified organism: Secondary | ICD-10-CM | POA: Diagnosis not present

## 2018-03-12 MED ORDER — BENZONATATE 100 MG PO CAPS: ORAL_CAPSULE | ORAL | 0 refills | Status: DC

## 2018-03-12 NOTE — Telephone Encounter (Signed)
Pls redirect the INR question to coumadin clinic---they have been managing his coumadin. As for the cough, he can take mucinex DM in morning, and I'll send in tessalon pearls for bedtime.-thx

## 2018-03-12 NOTE — Telephone Encounter (Signed)
Shaun Bamberg, RN from [facility] would like to know when Dr Anitra Lauth would like to have his his INR checked again? She said they can do that.  She also states that he is coughing constantly and can't sleep at night. He is currently taking mucinex DM. Can you order him something so he can sleep and not cough all night?  Call back @ [phone]   Please advise. Thanks.

## 2018-03-12 NOTE — Telephone Encounter (Signed)
Copied from [place] [id number]. Topic: Quick Communication - See Telephone Encounter >> Mar 12, 2018 12:11 PM Vernona Rieger wrote: CRM for notification. See Telephone encounter for: 03/12/18.  Estill Bamberg, RN from [facility] would like to know when Dr Anitra Lauth would like to have his his INR checked again? She said they can do that.  She also states that he is coughing constantly and can't sleep at night. He is currently taking mucinex DM. Can you order him something so he can sleep and not cough all night?  Call back @ [phone]

## 2018-03-12 NOTE — Telephone Encounter (Signed)
[name] w/ [facility] advised and voiced understanding.

## 2018-03-13 ENCOUNTER — Other Ambulatory Visit: Payer: Self-pay

## 2018-03-13 DIAGNOSIS — I2510 Atherosclerotic heart disease of native coronary artery without angina pectoris: Secondary | ICD-10-CM | POA: Diagnosis not present

## 2018-03-13 DIAGNOSIS — J189 Pneumonia, unspecified organism: Secondary | ICD-10-CM | POA: Diagnosis not present

## 2018-03-13 DIAGNOSIS — J9621 Acute and chronic respiratory failure with hypoxia: Secondary | ICD-10-CM | POA: Diagnosis not present

## 2018-03-13 DIAGNOSIS — E1122 Type 2 diabetes mellitus with diabetic chronic kidney disease: Secondary | ICD-10-CM | POA: Diagnosis not present

## 2018-03-13 DIAGNOSIS — J440 Chronic obstructive pulmonary disease with acute lower respiratory infection: Secondary | ICD-10-CM | POA: Diagnosis not present

## 2018-03-13 DIAGNOSIS — I130 Hypertensive heart and chronic kidney disease with heart failure and stage 1 through stage 4 chronic kidney disease, or unspecified chronic kidney disease: Secondary | ICD-10-CM | POA: Diagnosis not present

## 2018-03-13 NOTE — Patient Outreach (Signed)
[place] [facility]) Care Management  03/13/2018  ALTAN KRAAI Jan 29, 1936 [id number]   EMMI- General Discharge RED ON EMMI ALERT Day # 4 Date: 03/12/18 Red Alert Reason: Lost interest in things? Yes   Outreach attempt #1 Spoke with patient.  He asked if CM would speak with his wife.  Spoke with wife.  She is able to verify HIPAA. Addressed red alert.  She states that was a mistake and that patient is doing well and slowly getting better.  Wife did raise concern about patient not having home care.  She states that it was supposed to be ordered but no one has called or come by.  Advised her that I would call [facility] and get back with her.  She verbalized understanding.  Telephone call to [facility] for status of referral.  Spoke with Estill Bamberg, she states that she saw them on yesterday and that she had to keep repeating things for them and that a family member came in during the visit as well. Thanked her for the information.  Telephone call back to wife.  Advised her on the information. Asked if the nurse had come by on yesterday.  She states she did but the therapist has not.  Advised her that the therapist is normally after the nurse and that they would be in contact. She verbalized understanding.  Discussed [facility] services as a support.  She says patient is doing good and does not think he needs services right now.  Advised that CM would send letter and brochure for future reference.  She verbalized understanding.       Plan: RN CM will send letter and brochure. RN CM will close case.    Jone Baseman, RN, MSN [facility] Care Management Care Management Coordinator Direct Line [phone] Toll Free: [phone]  Fax: [phone]

## 2018-03-14 DIAGNOSIS — I2510 Atherosclerotic heart disease of native coronary artery without angina pectoris: Secondary | ICD-10-CM | POA: Diagnosis not present

## 2018-03-14 DIAGNOSIS — E1122 Type 2 diabetes mellitus with diabetic chronic kidney disease: Secondary | ICD-10-CM | POA: Diagnosis not present

## 2018-03-14 DIAGNOSIS — J440 Chronic obstructive pulmonary disease with acute lower respiratory infection: Secondary | ICD-10-CM | POA: Diagnosis not present

## 2018-03-14 DIAGNOSIS — J9621 Acute and chronic respiratory failure with hypoxia: Secondary | ICD-10-CM | POA: Diagnosis not present

## 2018-03-14 DIAGNOSIS — I130 Hypertensive heart and chronic kidney disease with heart failure and stage 1 through stage 4 chronic kidney disease, or unspecified chronic kidney disease: Secondary | ICD-10-CM | POA: Diagnosis not present

## 2018-03-14 DIAGNOSIS — J189 Pneumonia, unspecified organism: Secondary | ICD-10-CM | POA: Diagnosis not present

## 2018-03-15 ENCOUNTER — Ambulatory Visit (INDEPENDENT_AMBULATORY_CARE_PROVIDER_SITE_OTHER): Payer: Medicare Other | Admitting: Family Medicine

## 2018-03-15 ENCOUNTER — Encounter: Payer: Self-pay | Admitting: Family Medicine

## 2018-03-15 ENCOUNTER — Ambulatory Visit (INDEPENDENT_AMBULATORY_CARE_PROVIDER_SITE_OTHER): Payer: Medicare Other | Admitting: Pharmacist

## 2018-03-15 ENCOUNTER — Telehealth: Payer: Self-pay | Admitting: Family Medicine

## 2018-03-15 VITALS — BP 116/65 | HR 96 | Temp 97.9°F | Resp 16 | Ht 68.0 in | Wt 185.1 lb

## 2018-03-15 DIAGNOSIS — R9389 Abnormal findings on diagnostic imaging of other specified body structures: Secondary | ICD-10-CM

## 2018-03-15 DIAGNOSIS — H6123 Impacted cerumen, bilateral: Secondary | ICD-10-CM | POA: Diagnosis not present

## 2018-03-15 DIAGNOSIS — Z5181 Encounter for therapeutic drug level monitoring: Secondary | ICD-10-CM

## 2018-03-15 DIAGNOSIS — I2510 Atherosclerotic heart disease of native coronary artery without angina pectoris: Secondary | ICD-10-CM | POA: Diagnosis not present

## 2018-03-15 DIAGNOSIS — E1122 Type 2 diabetes mellitus with diabetic chronic kidney disease: Secondary | ICD-10-CM | POA: Diagnosis not present

## 2018-03-15 DIAGNOSIS — I5033 Acute on chronic diastolic (congestive) heart failure: Secondary | ICD-10-CM

## 2018-03-15 DIAGNOSIS — I482 Chronic atrial fibrillation, unspecified: Secondary | ICD-10-CM

## 2018-03-15 DIAGNOSIS — J449 Chronic obstructive pulmonary disease, unspecified: Secondary | ICD-10-CM | POA: Diagnosis not present

## 2018-03-15 DIAGNOSIS — J9621 Acute and chronic respiratory failure with hypoxia: Secondary | ICD-10-CM

## 2018-03-15 DIAGNOSIS — J440 Chronic obstructive pulmonary disease with acute lower respiratory infection: Secondary | ICD-10-CM | POA: Diagnosis not present

## 2018-03-15 DIAGNOSIS — J189 Pneumonia, unspecified organism: Secondary | ICD-10-CM | POA: Diagnosis not present

## 2018-03-15 DIAGNOSIS — I130 Hypertensive heart and chronic kidney disease with heart failure and stage 1 through stage 4 chronic kidney disease, or unspecified chronic kidney disease: Secondary | ICD-10-CM | POA: Diagnosis not present

## 2018-03-15 LAB — POCT INR: INR: 2.7 (ref 2.0–3.0)

## 2018-03-15 MED ORDER — BENZONATATE 200 MG PO CAPS: ORAL_CAPSULE | ORAL | 0 refills | Status: DC

## 2018-03-15 NOTE — Telephone Encounter (Signed)
Yes, this is fiine.

## 2018-03-15 NOTE — Telephone Encounter (Signed)
Copied from [place] [id number]. Topic: Quick Communication - See Telephone Encounter >> Mar 15, 2018  9:25 AM Ahmed Prima L wrote: CRM for notification. See Telephone encounter for: 03/15/18.  Tennis Must, occupational therapist from [facility] called and needs verbals for occupation therapy 2 times a week for 3 weeks. Call back @ [phone]

## 2018-03-15 NOTE — Progress Notes (Signed)
03/15/2018  CC:  Chief Complaint  Patient presents with  . Hospitalization Follow-up    TCM    Patient is a 82 y.o. Caucasian male who presents accompanied by daughter for hospital follow up, specifically [facility] face-to-face visit. Dates hospitalized: 6/16-6/19, 2019. Days since d/c from hospital: 8 Patient was discharged from hospital to home, with [facility]. Reason for admission to hospital: Patient was admitted to the hospital with working diagnosis of acute on chronic hypoxic respiratory failure due to COPD exacerbation in the setting of left lower community acquired pneumonia. Date of interactive (phone) contact with patient and/or caregiver: 03/09/18  I have reviewed patient's discharge summary plus pertinent specific notes, labs, and imaging from the hospitalization.   Patient received antibiotic therapy with IV ceftriaxone and doxycycline, respiratory panel was positive for rhinovirus and enterovirus, suggesting a viral pneumonia.  Further antibiotic will be discontinued He was diuresed a little extra as well and went home on no new medications.  He is feeling much improved but still lots of coughing, esp hs and impairs sleeping---he can walk now w/out cane or walker but his wife says he should use one. Wears 3 L oxygen 24/7, oxygen sats at home 92-96%. Glucose 115 fasting this morning. Home bp's consistently 120s/70.  HH PT/OT/nursing in place. Appetite is good. Admits he doesn't drink enough fluids.  "I just sit there in the chair and watch TV". Using albut neb bid.  At beginning of illness he was using this q4h.  Medication reconciliation was done today and patient is taking meds as recommended by discharging hospitalist/specialist.    PMH:  Past Medical History:  Diagnosis Date  . Adrenal adenoma   . Atrial fibrillation (HCC)    Rate control + Coumadin  . Atrial flutter ([place])   . CAD (coronary artery disease)    Lexiscan Myoview (01/2014): Lateral soft  tissue attenuation, no ischemia, not gated, low risk  . Cancer ([place])   . CAP (community acquired pneumonia)    Hospitalized 10/2014  . Chronic diastolic heart failure ([place])   . Chronic hypoxemic respiratory failure (HCC)    from COPD  . Chronic renal insufficiency, stage III (moderate) (HCC)    CrCl 50s  . COPD (chronic obstructive pulmonary disease) (HCC)    oxygen-dependent (2.5 L 24/7).  Improved on starting chronic prednisone 06/2017 (increased to 20 mg qd 08/2017)  Progressive worsening dyspnea at pulm f/u 10/19/17--? changed to continuous 02 from pulsed delivery (?)--no change in med regimen.  . Diabetes mellitus without complication ([place]) 12/75/1700   A1c 6.5 % 05/2016  . Diverticulosis   . DOE (dyspnea on exertion)    Multifactorial: deconditioning, COPD, obesity hypoventilation syndrome.  Marland Kitchen Dyslipidemia   . Embolic stroke ([place])    d/t endocarditis 2008  . Hemorrhoids   . History of mitral valve replacement    needs SBE prophylaxis  . History of prostate cancer remote past   f/u by Dr. Claris Che; no recurrence as of f/u 10/2015.  PSA rising from 2013 to 06/2016: as of 09/2017 plan is bone scan and CT abd/pelv if PSA gets up around 10.  Marland Kitchen HTN (hypertension)   . Mediastinal lymphadenopathy   . Pulmonary hypertension ([place]) 01/09/2015   secondary  . Right thyroid nodule   . Thrombocytopenia ([place])   . TIA (transient ischemic attack) 03/25/2011   ? of due to transient diplopia  . Ulcerative colitis    left-sided/segmental, associated with diverticulosis.  Sulfasalazine per GI (Dr. Carlean Purl).  . Warthin's  tumor     PSH:  Past Surgical History:  Procedure Laterality Date  . carotid duplex dopplers  04/2014   No signif plaques; repeat 2 yrs per cardiology  . CATARACT EXTRACTION, BILATERAL    . COLONOSCOPY W/ BIOPSIES  12/19/2007   left-sided colitis, diverticulosis, hemorrhoids  . FLEXIBLE SIGMOIDOSCOPY  01/11/2008; 03/19/2010   2009 and 2011:segmental left colitis, diverticulosis,  hemorrhoids  . INGUINAL HERNIA REPAIR     right  . MITRAL VALVE REPAIR  09/22/04   and modified Cox-Maze IV   . MITRAL VALVE REPAIR  09/14/05   redo MV repair d/t endocardiits/embolic events  . PROSTATECTOMY  06/2003   Radical  . RIGHT HEART CATHETERIZATION N/A 01/01/2015   Procedure: RIGHT HEART CATH;  Surgeon: Peter M Martinique, MD;  Location: [facility] CATH LAB;  Service: Cardiovascular;  Laterality: N/A;  . SALIVARY GLAND SURGERY     left resection  . TRANSTHORACIC ECHOCARDIOGRAM  11/2013; 07/2017   2015: Mod LVH, EF 55-60%, no WM abnormalities, +LA dilation, +severe RV dilation and impaired systolic fxn, +increased pulm art pressures.  Valves ok.  2018- EF 60-65%, wall motion nl, s/p MV repair, mild RV syst dysf.     MEDS:  Outpatient Medications Prior to Visit  Medication Sig Dispense Refill  . albuterol (PROVENTIL HFA;VENTOLIN HFA) 108 (90 Base) MCG/ACT inhaler INHALE ONE PUFF BY MOUTH EVERY 6 HOURS AS NEEDED FOR WHEEZING AND FOR SHORTNESS OF BREATH (USING 5-6 TIMES EVERY 24 HOURS) 1 Inhaler 5  . albuterol (PROVENTIL) (2.5 MG/3ML) 0.083% nebulizer solution Take 3 mLs (2.5 mg total) by nebulization every 4 (four) hours. And as needed 300 mL 5  . budesonide (PULMICORT) 0.5 MG/2ML nebulizer solution USE ONE VIAL IN NEBULIZER TWICE DAILY 120 mL 5  . digoxin (LANOXIN) 0.125 MG tablet Take 1 tablet (0.125 mg total) by mouth daily. 90 tablet 3  . diltiazem (CARDIZEM CD) 240 MG 24 hr capsule Take 1 capsule (240 mg total) by mouth daily. 90 capsule 3  . furosemide (LASIX) 40 MG tablet Take 1 tablet (40 mg total) by mouth daily. 30 tablet 0  . pravastatin (PRAVACHOL) 40 MG tablet TAKE 1 TABLET EVERY EVENING (Patient taking differently: Take 40 mg by mouth daily. TAKE 1 TABLET EVERY EVENING) 90 tablet 3  . predniSONE (DELTASONE) 20 MG tablet Take 20 mg by mouth daily with breakfast.    . STIOLTO RESPIMAT 2.5-2.5 MCG/ACT AERS INHALE 2 PUFFS INTO THE LUNGS DAILY. 12 g 3  . warfarin (COUMADIN) 5 MG tablet  Take 0.5-1 tablets (2.5-5 mg total) by mouth See admin instructions. 5mg  daily except Friday take 2.5mg  AS DIRECTED BY COUMADIN CLINIC (Patient taking differently: Take 2.5-5 mg by mouth daily. ) 90 tablet 1  . benzonatate (TESSALON) 100 MG capsule 1-2 caps po qhs prn cough (Patient not taking: Reported on 03/15/2018) 30 capsule 0  . glipiZIDE (GLIPIZIDE XL) 2.5 MG 24 hr tablet Take 1 tablet (2.5 mg total) by mouth daily with breakfast. (Patient not taking: Reported on 03/15/2018) 90 tablet 1  . LORazepam (ATIVAN) 0.5 MG tablet Take 0.5 mg by mouth as needed for anxiety.     No facility-administered medications prior to visit.    EXAM: BP 116/65 (BP Location: Right Arm, Patient Position: Sitting, Cuff Size: Normal)   Pulse 96   Temp 97.9 F (36.6 C) (Oral)   Resp 16   Ht 5\' 8"  (1.727 m)   Wt 185 lb 2 oz (84 kg)   SpO2 94%   BMI  28.15 kg/m 3L oxygen via [place]. Gen: chronically ill-appearing, NAD, pleasant, lucid thought and speech. UEA:VWUJ: no injection, icteris, swelling, or exudate.  EOMI, PERRLA.  EAC's with 100% cerumen impactions bilat. Mouth: lips without lesion/swelling.  Oral mucosa pink and moist. Oropharynx without erythema, exudate, or swelling.  CV: Irreg irreg, rate 90s, no rub LUNGS: CTA bilat, nonlabored. Abd: soft, NT/ND, no HSM, no bruit, no mass.  BS normal.  No flank dullness or shifting dullness. EXT: 1-2+ bilat LE pitting edema, with 3+ bilat pitting on tops of feet.  Pertinent labs/imaging  CT chest w/contrast on 03/04/18: IMPRESSION: 1. Irregular and somewhat nodular/spiculated foci of airspace disease in the left lower lobe, possibly due to pneumonia, however lung neoplasm is also a concern. Short interval CT follow-up in 4-6 weeks following antibiotic trial is suggested. 2. Moderate emphysema 3. Decreased size of left adrenal gland adenoma.    Chemistry      Component Value Date/Time   NA 139 03/07/2018 0421   K 4.2 03/07/2018 0421   CL 103 03/07/2018 0421    CO2 29 03/07/2018 0421   BUN 24 (H) 03/07/2018 0421   CREATININE 1.21 03/07/2018 0421      Component Value Date/Time   CALCIUM 8.9 03/07/2018 0421   ALKPHOS 68 03/01/2018 1125   AST 20 03/01/2018 1125   ALT 18 03/01/2018 1125   BILITOT 1.1 03/01/2018 1125    GFR 60s  Lab Results  Component Value Date   WBC 10.3 03/07/2018   HGB 12.0 (L) 03/07/2018   HCT 37.0 (L) 03/07/2018   MCV 87.9 03/07/2018   PLT 141 (L) 03/07/2018   Lab Results  Component Value Date   INR 2.7 03/15/2018   INR 1.77 03/07/2018   INR 2.61 03/06/2018   PROTIME 18.5 03/10/2009   PROTIME 15.9 02/26/2009   PROTIME 14.2 02/20/2009    ASSESSMENT/PLAN:  TCM hosp f/u:  1) Acute on chronic resp failure secondary to CAP/COPD exac/CHF exac: Improving appropriately, essentially back to baseline cardiopulmonary status. Suspicious region noted in LLL on CT chest w/contrast in hosp; needs f/u CT chest w/contrast. Repeat CT chest w/contrast to re-eval possible lung mass--approx 04/19/18. Tessalon pearls eRx'd today. Bilat cerumen impaction: irrigated successfully by nurse today. All other chronic problems stable. He will continue all current chronic home meds + 3L oxygen 24/7.  Medical decision making of moderate complexity was utilized today.  An After Visit Summary was printed and given to the patient.  FOLLOW UP:  1 mo--order repeat CT chest w/contrast at that time.  Signed:  Crissie Sickles, MD           03/15/2018

## 2018-03-15 NOTE — Telephone Encounter (Signed)
Left detailed message on Bruce's phone w/ verbal orders for OT.

## 2018-03-16 DIAGNOSIS — I2510 Atherosclerotic heart disease of native coronary artery without angina pectoris: Secondary | ICD-10-CM | POA: Diagnosis not present

## 2018-03-16 DIAGNOSIS — I130 Hypertensive heart and chronic kidney disease with heart failure and stage 1 through stage 4 chronic kidney disease, or unspecified chronic kidney disease: Secondary | ICD-10-CM | POA: Diagnosis not present

## 2018-03-16 DIAGNOSIS — J9621 Acute and chronic respiratory failure with hypoxia: Secondary | ICD-10-CM | POA: Diagnosis not present

## 2018-03-16 DIAGNOSIS — E1122 Type 2 diabetes mellitus with diabetic chronic kidney disease: Secondary | ICD-10-CM | POA: Diagnosis not present

## 2018-03-16 DIAGNOSIS — J440 Chronic obstructive pulmonary disease with acute lower respiratory infection: Secondary | ICD-10-CM | POA: Diagnosis not present

## 2018-03-16 DIAGNOSIS — J189 Pneumonia, unspecified organism: Secondary | ICD-10-CM | POA: Diagnosis not present

## 2018-03-19 DIAGNOSIS — J440 Chronic obstructive pulmonary disease with acute lower respiratory infection: Secondary | ICD-10-CM | POA: Diagnosis not present

## 2018-03-19 DIAGNOSIS — I130 Hypertensive heart and chronic kidney disease with heart failure and stage 1 through stage 4 chronic kidney disease, or unspecified chronic kidney disease: Secondary | ICD-10-CM | POA: Diagnosis not present

## 2018-03-19 DIAGNOSIS — I2510 Atherosclerotic heart disease of native coronary artery without angina pectoris: Secondary | ICD-10-CM | POA: Diagnosis not present

## 2018-03-19 DIAGNOSIS — E1122 Type 2 diabetes mellitus with diabetic chronic kidney disease: Secondary | ICD-10-CM | POA: Diagnosis not present

## 2018-03-19 DIAGNOSIS — J9621 Acute and chronic respiratory failure with hypoxia: Secondary | ICD-10-CM | POA: Diagnosis not present

## 2018-03-19 DIAGNOSIS — J189 Pneumonia, unspecified organism: Secondary | ICD-10-CM | POA: Diagnosis not present

## 2018-03-20 ENCOUNTER — Ambulatory Visit (INDEPENDENT_AMBULATORY_CARE_PROVIDER_SITE_OTHER): Payer: Medicare Other | Admitting: Cardiology

## 2018-03-20 DIAGNOSIS — I482 Chronic atrial fibrillation, unspecified: Secondary | ICD-10-CM

## 2018-03-20 DIAGNOSIS — E1122 Type 2 diabetes mellitus with diabetic chronic kidney disease: Secondary | ICD-10-CM | POA: Diagnosis not present

## 2018-03-20 DIAGNOSIS — Z5181 Encounter for therapeutic drug level monitoring: Secondary | ICD-10-CM

## 2018-03-20 DIAGNOSIS — I2510 Atherosclerotic heart disease of native coronary artery without angina pectoris: Secondary | ICD-10-CM | POA: Diagnosis not present

## 2018-03-20 DIAGNOSIS — I130 Hypertensive heart and chronic kidney disease with heart failure and stage 1 through stage 4 chronic kidney disease, or unspecified chronic kidney disease: Secondary | ICD-10-CM | POA: Diagnosis not present

## 2018-03-20 DIAGNOSIS — J440 Chronic obstructive pulmonary disease with acute lower respiratory infection: Secondary | ICD-10-CM | POA: Diagnosis not present

## 2018-03-20 DIAGNOSIS — J189 Pneumonia, unspecified organism: Secondary | ICD-10-CM | POA: Diagnosis not present

## 2018-03-20 DIAGNOSIS — J9621 Acute and chronic respiratory failure with hypoxia: Secondary | ICD-10-CM | POA: Diagnosis not present

## 2018-03-20 LAB — POCT INR: INR: 2.8 (ref 2.0–3.0)

## 2018-03-20 NOTE — Patient Instructions (Signed)
Description   Spoke with Angie nurse with [facility] and spoke with pt's wife and  Instructed  to continue 1 tablet daily except 1/2 tablet on Mondays, Wednesdays and Fridays remains on Prednisone 20 mg daily . Recheck INR 2 weeks. Call with new medications or any changes [phone]. Be sure to keep intake of greens consistent Orders given to Jeanmarie Hubert RN with [facility]

## 2018-03-22 DIAGNOSIS — I2510 Atherosclerotic heart disease of native coronary artery without angina pectoris: Secondary | ICD-10-CM | POA: Diagnosis not present

## 2018-03-22 DIAGNOSIS — J189 Pneumonia, unspecified organism: Secondary | ICD-10-CM | POA: Diagnosis not present

## 2018-03-22 DIAGNOSIS — J9621 Acute and chronic respiratory failure with hypoxia: Secondary | ICD-10-CM | POA: Diagnosis not present

## 2018-03-22 DIAGNOSIS — I130 Hypertensive heart and chronic kidney disease with heart failure and stage 1 through stage 4 chronic kidney disease, or unspecified chronic kidney disease: Secondary | ICD-10-CM | POA: Diagnosis not present

## 2018-03-22 DIAGNOSIS — J440 Chronic obstructive pulmonary disease with acute lower respiratory infection: Secondary | ICD-10-CM | POA: Diagnosis not present

## 2018-03-22 DIAGNOSIS — E1122 Type 2 diabetes mellitus with diabetic chronic kidney disease: Secondary | ICD-10-CM | POA: Diagnosis not present

## 2018-03-23 DIAGNOSIS — J440 Chronic obstructive pulmonary disease with acute lower respiratory infection: Secondary | ICD-10-CM | POA: Diagnosis not present

## 2018-03-23 DIAGNOSIS — I130 Hypertensive heart and chronic kidney disease with heart failure and stage 1 through stage 4 chronic kidney disease, or unspecified chronic kidney disease: Secondary | ICD-10-CM | POA: Diagnosis not present

## 2018-03-23 DIAGNOSIS — J9621 Acute and chronic respiratory failure with hypoxia: Secondary | ICD-10-CM | POA: Diagnosis not present

## 2018-03-23 DIAGNOSIS — E1122 Type 2 diabetes mellitus with diabetic chronic kidney disease: Secondary | ICD-10-CM | POA: Diagnosis not present

## 2018-03-23 DIAGNOSIS — J189 Pneumonia, unspecified organism: Secondary | ICD-10-CM | POA: Diagnosis not present

## 2018-03-23 DIAGNOSIS — I2510 Atherosclerotic heart disease of native coronary artery without angina pectoris: Secondary | ICD-10-CM | POA: Diagnosis not present

## 2018-03-27 DIAGNOSIS — I2510 Atherosclerotic heart disease of native coronary artery without angina pectoris: Secondary | ICD-10-CM | POA: Diagnosis not present

## 2018-03-27 DIAGNOSIS — E1122 Type 2 diabetes mellitus with diabetic chronic kidney disease: Secondary | ICD-10-CM | POA: Diagnosis not present

## 2018-03-27 DIAGNOSIS — J440 Chronic obstructive pulmonary disease with acute lower respiratory infection: Secondary | ICD-10-CM | POA: Diagnosis not present

## 2018-03-27 DIAGNOSIS — J9621 Acute and chronic respiratory failure with hypoxia: Secondary | ICD-10-CM | POA: Diagnosis not present

## 2018-03-27 DIAGNOSIS — I130 Hypertensive heart and chronic kidney disease with heart failure and stage 1 through stage 4 chronic kidney disease, or unspecified chronic kidney disease: Secondary | ICD-10-CM | POA: Diagnosis not present

## 2018-03-27 DIAGNOSIS — J189 Pneumonia, unspecified organism: Secondary | ICD-10-CM | POA: Diagnosis not present

## 2018-03-29 DIAGNOSIS — J9621 Acute and chronic respiratory failure with hypoxia: Secondary | ICD-10-CM | POA: Diagnosis not present

## 2018-03-29 DIAGNOSIS — I130 Hypertensive heart and chronic kidney disease with heart failure and stage 1 through stage 4 chronic kidney disease, or unspecified chronic kidney disease: Secondary | ICD-10-CM | POA: Diagnosis not present

## 2018-03-29 DIAGNOSIS — I2510 Atherosclerotic heart disease of native coronary artery without angina pectoris: Secondary | ICD-10-CM | POA: Diagnosis not present

## 2018-03-29 DIAGNOSIS — E1122 Type 2 diabetes mellitus with diabetic chronic kidney disease: Secondary | ICD-10-CM | POA: Diagnosis not present

## 2018-03-29 DIAGNOSIS — J440 Chronic obstructive pulmonary disease with acute lower respiratory infection: Secondary | ICD-10-CM | POA: Diagnosis not present

## 2018-03-29 DIAGNOSIS — J189 Pneumonia, unspecified organism: Secondary | ICD-10-CM | POA: Diagnosis not present

## 2018-03-30 DIAGNOSIS — J440 Chronic obstructive pulmonary disease with acute lower respiratory infection: Secondary | ICD-10-CM | POA: Diagnosis not present

## 2018-03-30 DIAGNOSIS — E1122 Type 2 diabetes mellitus with diabetic chronic kidney disease: Secondary | ICD-10-CM | POA: Diagnosis not present

## 2018-03-30 DIAGNOSIS — I2510 Atherosclerotic heart disease of native coronary artery without angina pectoris: Secondary | ICD-10-CM | POA: Diagnosis not present

## 2018-03-30 DIAGNOSIS — J189 Pneumonia, unspecified organism: Secondary | ICD-10-CM | POA: Diagnosis not present

## 2018-03-30 DIAGNOSIS — I130 Hypertensive heart and chronic kidney disease with heart failure and stage 1 through stage 4 chronic kidney disease, or unspecified chronic kidney disease: Secondary | ICD-10-CM | POA: Diagnosis not present

## 2018-03-30 DIAGNOSIS — J9621 Acute and chronic respiratory failure with hypoxia: Secondary | ICD-10-CM | POA: Diagnosis not present

## 2018-04-02 ENCOUNTER — Ambulatory Visit (INDEPENDENT_AMBULATORY_CARE_PROVIDER_SITE_OTHER): Payer: Medicare Other | Admitting: Cardiology

## 2018-04-02 DIAGNOSIS — E1122 Type 2 diabetes mellitus with diabetic chronic kidney disease: Secondary | ICD-10-CM | POA: Diagnosis not present

## 2018-04-02 DIAGNOSIS — Z5181 Encounter for therapeutic drug level monitoring: Secondary | ICD-10-CM

## 2018-04-02 DIAGNOSIS — I2510 Atherosclerotic heart disease of native coronary artery without angina pectoris: Secondary | ICD-10-CM | POA: Diagnosis not present

## 2018-04-02 DIAGNOSIS — I482 Chronic atrial fibrillation, unspecified: Secondary | ICD-10-CM

## 2018-04-02 DIAGNOSIS — J440 Chronic obstructive pulmonary disease with acute lower respiratory infection: Secondary | ICD-10-CM | POA: Diagnosis not present

## 2018-04-02 DIAGNOSIS — J9621 Acute and chronic respiratory failure with hypoxia: Secondary | ICD-10-CM | POA: Diagnosis not present

## 2018-04-02 DIAGNOSIS — I130 Hypertensive heart and chronic kidney disease with heart failure and stage 1 through stage 4 chronic kidney disease, or unspecified chronic kidney disease: Secondary | ICD-10-CM | POA: Diagnosis not present

## 2018-04-02 DIAGNOSIS — J189 Pneumonia, unspecified organism: Secondary | ICD-10-CM | POA: Diagnosis not present

## 2018-04-02 LAB — POCT INR: INR: 1.8 — AB (ref 2.0–3.0)

## 2018-04-02 NOTE — Patient Instructions (Signed)
Description   Spoke with Estill Bamberg RN with [facility] and spoke with pt's wife and instructed to have pt take 1 tablet today then continue 1 tablet daily except 1/2 tablet on Mondays, Wednesdays, and Fridays remains on Prednisone 20 mg daily. Recheck INR 2 weeks. Call with new medications or any changes [phone].

## 2018-04-03 DIAGNOSIS — E1122 Type 2 diabetes mellitus with diabetic chronic kidney disease: Secondary | ICD-10-CM | POA: Diagnosis not present

## 2018-04-03 DIAGNOSIS — I130 Hypertensive heart and chronic kidney disease with heart failure and stage 1 through stage 4 chronic kidney disease, or unspecified chronic kidney disease: Secondary | ICD-10-CM | POA: Diagnosis not present

## 2018-04-03 DIAGNOSIS — J9621 Acute and chronic respiratory failure with hypoxia: Secondary | ICD-10-CM | POA: Diagnosis not present

## 2018-04-03 DIAGNOSIS — J189 Pneumonia, unspecified organism: Secondary | ICD-10-CM | POA: Diagnosis not present

## 2018-04-03 DIAGNOSIS — I2510 Atherosclerotic heart disease of native coronary artery without angina pectoris: Secondary | ICD-10-CM | POA: Diagnosis not present

## 2018-04-03 DIAGNOSIS — J440 Chronic obstructive pulmonary disease with acute lower respiratory infection: Secondary | ICD-10-CM | POA: Diagnosis not present

## 2018-04-04 DIAGNOSIS — H6983 Other specified disorders of Eustachian tube, bilateral: Secondary | ICD-10-CM | POA: Diagnosis not present

## 2018-04-04 DIAGNOSIS — H903 Sensorineural hearing loss, bilateral: Secondary | ICD-10-CM | POA: Diagnosis not present

## 2018-04-04 DIAGNOSIS — H6123 Impacted cerumen, bilateral: Secondary | ICD-10-CM | POA: Diagnosis not present

## 2018-04-05 DIAGNOSIS — J9621 Acute and chronic respiratory failure with hypoxia: Secondary | ICD-10-CM | POA: Diagnosis not present

## 2018-04-05 DIAGNOSIS — J189 Pneumonia, unspecified organism: Secondary | ICD-10-CM | POA: Diagnosis not present

## 2018-04-05 DIAGNOSIS — J440 Chronic obstructive pulmonary disease with acute lower respiratory infection: Secondary | ICD-10-CM | POA: Diagnosis not present

## 2018-04-05 DIAGNOSIS — I130 Hypertensive heart and chronic kidney disease with heart failure and stage 1 through stage 4 chronic kidney disease, or unspecified chronic kidney disease: Secondary | ICD-10-CM | POA: Diagnosis not present

## 2018-04-05 DIAGNOSIS — E1122 Type 2 diabetes mellitus with diabetic chronic kidney disease: Secondary | ICD-10-CM | POA: Diagnosis not present

## 2018-04-05 DIAGNOSIS — I2510 Atherosclerotic heart disease of native coronary artery without angina pectoris: Secondary | ICD-10-CM | POA: Diagnosis not present

## 2018-04-07 ENCOUNTER — Other Ambulatory Visit: Payer: Self-pay | Admitting: Emergency Medicine

## 2018-04-16 ENCOUNTER — Ambulatory Visit (INDEPENDENT_AMBULATORY_CARE_PROVIDER_SITE_OTHER): Payer: Medicare Other | Admitting: Internal Medicine

## 2018-04-16 DIAGNOSIS — J9621 Acute and chronic respiratory failure with hypoxia: Secondary | ICD-10-CM | POA: Diagnosis not present

## 2018-04-16 DIAGNOSIS — I130 Hypertensive heart and chronic kidney disease with heart failure and stage 1 through stage 4 chronic kidney disease, or unspecified chronic kidney disease: Secondary | ICD-10-CM | POA: Diagnosis not present

## 2018-04-16 DIAGNOSIS — E1122 Type 2 diabetes mellitus with diabetic chronic kidney disease: Secondary | ICD-10-CM | POA: Diagnosis not present

## 2018-04-16 DIAGNOSIS — J440 Chronic obstructive pulmonary disease with acute lower respiratory infection: Secondary | ICD-10-CM | POA: Diagnosis not present

## 2018-04-16 DIAGNOSIS — I482 Chronic atrial fibrillation, unspecified: Secondary | ICD-10-CM

## 2018-04-16 DIAGNOSIS — I2510 Atherosclerotic heart disease of native coronary artery without angina pectoris: Secondary | ICD-10-CM | POA: Diagnosis not present

## 2018-04-16 DIAGNOSIS — Z5181 Encounter for therapeutic drug level monitoring: Secondary | ICD-10-CM

## 2018-04-16 DIAGNOSIS — J189 Pneumonia, unspecified organism: Secondary | ICD-10-CM | POA: Diagnosis not present

## 2018-04-16 LAB — POCT INR: INR: 1.5 — AB (ref 2.0–3.0)

## 2018-04-17 ENCOUNTER — Ambulatory Visit (INDEPENDENT_AMBULATORY_CARE_PROVIDER_SITE_OTHER): Payer: Medicare Other | Admitting: Family Medicine

## 2018-04-17 ENCOUNTER — Encounter: Payer: Self-pay | Admitting: Family Medicine

## 2018-04-17 VITALS — BP 115/77 | HR 77 | Temp 97.4°F | Resp 16 | Ht 68.0 in | Wt 190.4 lb

## 2018-04-17 DIAGNOSIS — R911 Solitary pulmonary nodule: Secondary | ICD-10-CM | POA: Diagnosis not present

## 2018-04-17 DIAGNOSIS — J9611 Chronic respiratory failure with hypoxia: Secondary | ICD-10-CM | POA: Diagnosis not present

## 2018-04-17 DIAGNOSIS — I5033 Acute on chronic diastolic (congestive) heart failure: Secondary | ICD-10-CM

## 2018-04-17 DIAGNOSIS — R9389 Abnormal findings on diagnostic imaging of other specified body structures: Secondary | ICD-10-CM | POA: Diagnosis not present

## 2018-04-17 DIAGNOSIS — N182 Chronic kidney disease, stage 2 (mild): Secondary | ICD-10-CM

## 2018-04-17 DIAGNOSIS — Z9114 Patient's other noncompliance with medication regimen: Secondary | ICD-10-CM | POA: Diagnosis not present

## 2018-04-17 DIAGNOSIS — R918 Other nonspecific abnormal finding of lung field: Secondary | ICD-10-CM

## 2018-04-17 DIAGNOSIS — I2510 Atherosclerotic heart disease of native coronary artery without angina pectoris: Secondary | ICD-10-CM | POA: Diagnosis not present

## 2018-04-17 LAB — BASIC METABOLIC PANEL
BUN: 16 mg/dL (ref 6–23)
CO2: 29 mEq/L (ref 19–32)
CREATININE: 1.1 mg/dL (ref 0.40–1.50)
Calcium: 9.6 mg/dL (ref 8.4–10.5)
Chloride: 103 mEq/L (ref 96–112)
GFR: 68.4 mL/min (ref >60.00)
Glucose, Bld: 107 mg/dL — ABNORMAL HIGH (ref 70–99)
Potassium: 3.9 mEq/L (ref 3.5–5.1)
Sodium: 139 mEq/L (ref 135–145)

## 2018-04-17 NOTE — Patient Instructions (Signed)
Increase your water pill (furosemide--also known as lasix) to 40mg  pill TWICE per day for the next 1 week.

## 2018-04-17 NOTE — Progress Notes (Signed)
OFFICE VISIT  04/17/2018   CC:  Chief Complaint  Patient presents with  . Follow-up    Resp. Failure/CAP    HPI:    Patient is a 82 y.o. Caucasian male who presents accompanied by his wife for f/u chronic hypoxic respiratory failure. Last visit I saw him for TCM hosp f/u: Acute on chronic resp failure secondary to CAP/COPD exac/CHF exac: He was improving appropriately, essentially back to baseline cardiopulmonary status. Suspicious region noted in LLL on CT chest w/contrast in hosp (infiltrate vs mass); as per radiologist's recommendation, he needs f/u CT chest w/contrast at this point in time.  Describes normal chronic feeling of SOB.  This morning felt particularly worse.  He notes that he missed an albuterol treatment this morning.  He also admits he sometimes skips his lasix dose b/c he doesn't want to pee so much.  Sounds like he has been skipping more lasix doses lately per wife's report. Has had some higher sodium foods lately and feeling swollen in legs more lately. Coughing "two or three times a day"---better than 1 mo ago.  No fevers.  No wheezing.  ROS: no CP, no focal weakness, no palpitations, no abd pain, no melena or hematochezia or epistaxis.  Past Medical History:  Diagnosis Date  . Adrenal adenoma   . Atrial fibrillation (HCC)    Rate control + Coumadin  . Atrial flutter ([place])   . CAD (coronary artery disease)    Lexiscan Myoview (01/2014): Lateral soft tissue attenuation, no ischemia, not gated, low risk  . Cancer ([place])   . CAP (community acquired pneumonia)    Hospitalized 10/2014  . Chronic diastolic heart failure ([place])   . Chronic hypoxemic respiratory failure (HCC)    from COPD  . Chronic renal insufficiency, stage III (moderate) (HCC)    CrCl 50s  . COPD (chronic obstructive pulmonary disease) (HCC)    oxygen-dependent (2.5 L 24/7).  Improved on starting chronic prednisone 06/2017 (increased to 20 mg qd 08/2017)  Progressive worsening dyspnea at pulm f/u  10/19/17--? changed to continuous 02 from pulsed delivery (?)--no change in med regimen.  . Diabetes mellitus without complication ([place]) 96/28/3662   A1c 6.5 % 05/2016  . Diverticulosis   . DOE (dyspnea on exertion)    Multifactorial: deconditioning, COPD, obesity hypoventilation syndrome.  Marland Kitchen Dyslipidemia   . Embolic stroke ([place])    d/t endocarditis 2008  . Hemorrhoids   . History of mitral valve replacement    needs SBE prophylaxis  . History of prostate cancer remote past   f/u by Dr. Claris Che; no recurrence as of f/u 10/2015.  PSA rising from 2013 to 06/2016: as of 09/2017 plan is bone scan and CT abd/pelv if PSA gets up around 10.  Marland Kitchen HTN (hypertension)   . Mediastinal lymphadenopathy   . Pulmonary hypertension ([place]) 01/09/2015   secondary  . Right thyroid nodule   . Thrombocytopenia ([place])   . TIA (transient ischemic attack) 03/25/2011   ? of due to transient diplopia  . Ulcerative colitis    left-sided/segmental, associated with diverticulosis.  Sulfasalazine per GI (Dr. Carlean Purl).  . Warthin's tumor     Past Surgical History:  Procedure Laterality Date  . carotid duplex dopplers  04/2014   No signif plaques; repeat 2 yrs per cardiology  . CATARACT EXTRACTION, BILATERAL    . COLONOSCOPY W/ BIOPSIES  12/19/2007   left-sided colitis, diverticulosis, hemorrhoids  . FLEXIBLE SIGMOIDOSCOPY  01/11/2008; 03/19/2010   2009 and 2011:segmental left colitis, diverticulosis, hemorrhoids  .  INGUINAL HERNIA REPAIR     right  . MITRAL VALVE REPAIR  09/22/04   and modified Cox-Maze IV   . MITRAL VALVE REPAIR  09/14/05   redo MV repair d/t endocardiits/embolic events  . PROSTATECTOMY  06/2003   Radical  . RIGHT HEART CATHETERIZATION N/A 01/01/2015   Procedure: RIGHT HEART CATH;  Surgeon: Peter M Martinique, MD;  Location: [facility] CATH LAB;  Service: Cardiovascular;  Laterality: N/A;  . SALIVARY GLAND SURGERY     left resection  . TRANSTHORACIC ECHOCARDIOGRAM  11/2013; 07/2017   2015: Mod LVH, EF 55-60%, no  WM abnormalities, +LA dilation, +severe RV dilation and impaired systolic fxn, +increased pulm art pressures.  Valves ok.  2018- EF 60-65%, wall motion nl, s/p MV repair, mild RV syst dysf.     Outpatient Medications Prior to Visit  Medication Sig Dispense Refill  . albuterol (PROVENTIL HFA;VENTOLIN HFA) 108 (90 Base) MCG/ACT inhaler INHALE ONE PUFF BY MOUTH EVERY 6 HOURS AS NEEDED FOR WHEEZING AND FOR SHORTNESS OF BREATH (USING 5-6 TIMES EVERY 24 HOURS) 1 Inhaler 5  . albuterol (PROVENTIL HFA;VENTOLIN HFA) 108 (90 Base) MCG/ACT inhaler INHALE ONE PUFF BY MOUTH EVERY 6 HOURS AS NEEDED FOR WHEEZING AND FOR SHORTNESS OF BREATH (USING 5-6 TIMES EVERY 24 HOURS) 54 each 2  . albuterol (PROVENTIL) (2.5 MG/3ML) 0.083% nebulizer solution Take 3 mLs (2.5 mg total) by nebulization every 4 (four) hours. And as needed 300 mL 5  . budesonide (PULMICORT) 0.5 MG/2ML nebulizer solution USE ONE VIAL IN NEBULIZER TWICE DAILY 120 mL 5  . digoxin (LANOXIN) 0.125 MG tablet Take 1 tablet (0.125 mg total) by mouth daily. 90 tablet 3  . diltiazem (CARDIZEM CD) 240 MG 24 hr capsule Take 1 capsule (240 mg total) by mouth daily. 90 capsule 3  . glipiZIDE (GLUCOTROL XL) 2.5 MG 24 hr tablet Take 1 tablet by mouth daily.    Marland Kitchen LORazepam (ATIVAN) 0.5 MG tablet Take 1 tablet by mouth daily as needed.    . mometasone (NASONEX) 50 MCG/ACT nasal spray Place 2 sprays into the nose daily as needed.    . pravastatin (PRAVACHOL) 40 MG tablet TAKE 1 TABLET EVERY EVENING (Patient taking differently: Take 40 mg by mouth daily. TAKE 1 TABLET EVERY EVENING) 90 tablet 3  . predniSONE (DELTASONE) 20 MG tablet Take 20 mg by mouth daily with breakfast.    . STIOLTO RESPIMAT 2.5-2.5 MCG/ACT AERS INHALE 2 PUFFS INTO THE LUNGS DAILY. 12 g 3  . warfarin (COUMADIN) 5 MG tablet Take 0.5-1 tablets (2.5-5 mg total) by mouth See admin instructions. 5mg  daily except Friday take 2.5mg  AS DIRECTED BY COUMADIN CLINIC (Patient taking differently: Take 2.5-5 mg  by mouth daily. ) 90 tablet 1  . furosemide (LASIX) 40 MG tablet Take 1 tablet (40 mg total) by mouth daily. 30 tablet 0  . benzonatate (TESSALON) 200 MG capsule 1 tab po qhs prn cough (Patient not taking: Reported on 04/17/2018) 30 capsule 0   No facility-administered medications prior to visit.     No Known Allergies  ROS As per HPI  PE: Blood pressure 115/77, pulse 77, temperature (!) 97.4 F (36.3 C), temperature source Oral, resp. rate 16, height 5\' 8"  (1.727 m), weight 190 lb 6 oz (86.4 kg), SpO2 93 %.2L/min Gen: Alert, well appearing.  Patient is oriented to person, place, time, and situation. AFFECT: pleasant, lucid thought and speech. Face is a bit puffy. CV: irreg irreg, no m/r, rate 70s. LUNGS: diminished aeration diffusely as  per his normal exam, no crackles or wheezes or signif prolongation of exp phase.  Nonlabored resps. EXT: no cyanosis or clubbing.  He has 2 + pitting edema in RLL, 1+ pitting edema in L LL.  LABS:    Chemistry      Component Value Date/Time   NA 139 03/07/2018 0421   K 4.2 03/07/2018 0421   CL 103 03/07/2018 0421   CO2 29 03/07/2018 0421   BUN 24 (H) 03/07/2018 0421   CREATININE 1.21 03/07/2018 0421      Component Value Date/Time   CALCIUM 8.9 03/07/2018 0421   ALKPHOS 68 03/01/2018 1125   AST 20 03/01/2018 1125   ALT 18 03/01/2018 1125   BILITOT 1.1 03/01/2018 1125     Lab Results  Component Value Date   WBC 10.3 03/07/2018   HGB 12.0 (L) 03/07/2018   HCT 37.0 (L) 03/07/2018   MCV 87.9 03/07/2018   PLT 141 (L) 03/07/2018     IMPRESSION AND PLAN:  1) Abnormal chest CT: LLL, infiltrate vs mass. Will repeat CT chest w/contrast as per radiologist's recommendation. BMET now.  2) Chronic hypoxic resp failure: mild volume overload lately due to noncompliance with low Na diet AND with taking his lasix.  His oxygen needs are at baseline and his lungs sound clear.  Low suspicion of acute COPD exacerbation. Will have him take his lasix  40mg  bid for the next week and I'll recheck him in office 1 wk. Continue all other cardiovascular and pulmonary meds at current dosing.  An After Visit Summary was printed and given to the patient.  FOLLOW UP: Return in about 1 week (around 04/24/2018) for f/u pulm/sob/volume overload.  Signed:  Crissie Sickles, MD           04/17/2018

## 2018-04-23 DIAGNOSIS — H906 Mixed conductive and sensorineural hearing loss, bilateral: Secondary | ICD-10-CM | POA: Diagnosis not present

## 2018-04-23 DIAGNOSIS — H6983 Other specified disorders of Eustachian tube, bilateral: Secondary | ICD-10-CM | POA: Diagnosis not present

## 2018-04-23 DIAGNOSIS — H65493 Other chronic nonsuppurative otitis media, bilateral: Secondary | ICD-10-CM | POA: Diagnosis not present

## 2018-04-23 DIAGNOSIS — H903 Sensorineural hearing loss, bilateral: Secondary | ICD-10-CM | POA: Diagnosis not present

## 2018-04-23 DIAGNOSIS — H900 Conductive hearing loss, bilateral: Secondary | ICD-10-CM | POA: Diagnosis not present

## 2018-04-24 ENCOUNTER — Ambulatory Visit (INDEPENDENT_AMBULATORY_CARE_PROVIDER_SITE_OTHER): Payer: Medicare Other | Admitting: Family Medicine

## 2018-04-24 ENCOUNTER — Encounter: Payer: Self-pay | Admitting: Family Medicine

## 2018-04-24 ENCOUNTER — Ambulatory Visit (HOSPITAL_BASED_OUTPATIENT_CLINIC_OR_DEPARTMENT_OTHER)
Admission: RE | Admit: 2018-04-24 | Discharge: 2018-04-24 | Disposition: A | Discharge status: Home / Self Care | Payer: Medicare Other | Source: Ambulatory Visit | Attending: Family Medicine | Admitting: Family Medicine

## 2018-04-24 ENCOUNTER — Encounter (HOSPITAL_BASED_OUTPATIENT_CLINIC_OR_DEPARTMENT_OTHER): Payer: Self-pay

## 2018-04-24 ENCOUNTER — Ambulatory Visit (INDEPENDENT_AMBULATORY_CARE_PROVIDER_SITE_OTHER): Payer: Medicare Other | Admitting: Cardiology

## 2018-04-24 VITALS — BP 119/66 | HR 112 | Temp 97.5°F | Resp 16 | Ht 68.0 in | Wt 188.5 lb

## 2018-04-24 DIAGNOSIS — E1122 Type 2 diabetes mellitus with diabetic chronic kidney disease: Secondary | ICD-10-CM | POA: Diagnosis not present

## 2018-04-24 DIAGNOSIS — I482 Chronic atrial fibrillation, unspecified: Secondary | ICD-10-CM

## 2018-04-24 DIAGNOSIS — J438 Other emphysema: Secondary | ICD-10-CM | POA: Insufficient documentation

## 2018-04-24 DIAGNOSIS — R918 Other nonspecific abnormal finding of lung field: Secondary | ICD-10-CM | POA: Diagnosis not present

## 2018-04-24 DIAGNOSIS — J432 Centrilobular emphysema: Secondary | ICD-10-CM | POA: Insufficient documentation

## 2018-04-24 DIAGNOSIS — J9621 Acute and chronic respiratory failure with hypoxia: Secondary | ICD-10-CM | POA: Diagnosis not present

## 2018-04-24 DIAGNOSIS — Z952 Presence of prosthetic heart valve: Secondary | ICD-10-CM | POA: Insufficient documentation

## 2018-04-24 DIAGNOSIS — I2510 Atherosclerotic heart disease of native coronary artery without angina pectoris: Secondary | ICD-10-CM | POA: Insufficient documentation

## 2018-04-24 DIAGNOSIS — J189 Pneumonia, unspecified organism: Secondary | ICD-10-CM | POA: Diagnosis not present

## 2018-04-24 DIAGNOSIS — E8779 Other fluid overload: Secondary | ICD-10-CM | POA: Diagnosis not present

## 2018-04-24 DIAGNOSIS — R9389 Abnormal findings on diagnostic imaging of other specified body structures: Secondary | ICD-10-CM | POA: Diagnosis not present

## 2018-04-24 DIAGNOSIS — E119 Type 2 diabetes mellitus without complications: Secondary | ICD-10-CM

## 2018-04-24 DIAGNOSIS — I130 Hypertensive heart and chronic kidney disease with heart failure and stage 1 through stage 4 chronic kidney disease, or unspecified chronic kidney disease: Secondary | ICD-10-CM | POA: Diagnosis not present

## 2018-04-24 DIAGNOSIS — I700 Atherosclerosis of aorta: Secondary | ICD-10-CM | POA: Insufficient documentation

## 2018-04-24 DIAGNOSIS — I5032 Chronic diastolic (congestive) heart failure: Secondary | ICD-10-CM | POA: Diagnosis not present

## 2018-04-24 DIAGNOSIS — Z5181 Encounter for therapeutic drug level monitoring: Secondary | ICD-10-CM

## 2018-04-24 DIAGNOSIS — J440 Chronic obstructive pulmonary disease with acute lower respiratory infection: Secondary | ICD-10-CM | POA: Diagnosis not present

## 2018-04-24 DIAGNOSIS — R911 Solitary pulmonary nodule: Secondary | ICD-10-CM | POA: Diagnosis not present

## 2018-04-24 DIAGNOSIS — I5022 Chronic systolic (congestive) heart failure: Secondary | ICD-10-CM

## 2018-04-24 DIAGNOSIS — J439 Emphysema, unspecified: Secondary | ICD-10-CM | POA: Diagnosis not present

## 2018-04-24 LAB — BASIC METABOLIC PANEL
BUN: 16 mg/dL (ref 6–23)
CALCIUM: 9.5 mg/dL (ref 8.4–10.5)
CO2: 31 meq/L (ref 19–32)
Chloride: 100 mEq/L (ref 96–112)
Creatinine, Ser: 1.11 mg/dL (ref 0.40–1.50)
GFR: 67.68 mL/min (ref >60.00)
GLUCOSE: 155 mg/dL — AB (ref 70–99)
POTASSIUM: 3.6 meq/L (ref 3.5–5.1)
SODIUM: 141 meq/L (ref 135–145)

## 2018-04-24 LAB — HEMOGLOBIN A1C: HEMOGLOBIN A1C: 8.7 % — AB (ref 4.6–6.5)

## 2018-04-24 LAB — POCT INR: INR: 1.4 — AB (ref 2.0–3.0)

## 2018-04-24 MED ORDER — IOPAMIDOL (ISOVUE-300) INJECTION 61%
100.0000 mL | Freq: Once | INTRAVENOUS | Status: AC | PRN
  Administered 2018-04-24: 80 mL via INTRAVENOUS

## 2018-04-24 NOTE — Patient Instructions (Signed)
Go back to taking your water pill (furosemide) ONCE a day.

## 2018-04-24 NOTE — Progress Notes (Signed)
OFFICE VISIT  04/24/2018   CC:  Chief Complaint  Patient presents with  . Follow-up    plum/SHOB/volume overload    HPI:    Patient is a 82 y.o. Caucasian male with chronic hypoxic resp failure (2L oxygen 24/7) who presents for 1 week f/u for SOB due to volume overload. He had been partially noncompliant with diet/fluid intake AND with taking his lasix. Last visit I increased his lasix to 40mg  twice per day.    Also: In June this year he had an abnormal chest CT: LLL, infiltrate vs mass. Will repeat CT chest w/contrast as per radiologist's recommendation--he gets this later today.  INTERIM HX:  His wt is down 1.5 lbs compared to last visit by our scales. Breathing feels a little better, energy level better.  Says he feels good. Took lasix bid as instructed except took none yesterday b/c he was out of the house all day. Says swelling legs is better.  No coughing and no fevers.  DM 2: He is compliant with his glipizide. Not checking any glucoses at home.   Past Medical History:  Diagnosis Date  . Adrenal adenoma   . Atrial fibrillation (HCC)    Rate control + Coumadin  . Atrial flutter ([place])   . CAD (coronary artery disease)    Lexiscan Myoview (01/2014): Lateral soft tissue attenuation, no ischemia, not gated, low risk  . Cancer ([place])   . CAP (community acquired pneumonia)    Hospitalized 10/2014  . Chronic diastolic heart failure ([place])   . Chronic hypoxemic respiratory failure (HCC)    from COPD  . Chronic renal insufficiency, stage III (moderate) (HCC)    CrCl 50s  . COPD (chronic obstructive pulmonary disease) (HCC)    oxygen-dependent (2.5 L 24/7).  Improved on starting chronic prednisone 06/2017 (increased to 20 mg qd 08/2017)  Progressive worsening dyspnea at pulm f/u 10/19/17--? changed to continuous 02 from pulsed delivery (?)--no change in med regimen.  . Diabetes mellitus without complication ([place]) 76/81/1572   A1c 6.5 % 05/2016  . Diverticulosis   . DOE  (dyspnea on exertion)    Multifactorial: deconditioning, COPD, obesity hypoventilation syndrome.  Marland Kitchen Dyslipidemia   . Embolic stroke ([place])    d/t endocarditis 2008  . Hemorrhoids   . History of mitral valve replacement    needs SBE prophylaxis  . History of prostate cancer remote past   f/u by Dr. Claris Che; no recurrence as of f/u 10/2015.  PSA rising from 2013 to 06/2016: as of 09/2017 plan is bone scan and CT abd/pelv if PSA gets up around 10.  Marland Kitchen HTN (hypertension)   . Mediastinal lymphadenopathy   . Pulmonary hypertension ([place]) 01/09/2015   secondary  . Right thyroid nodule   . Thrombocytopenia ([place])   . TIA (transient ischemic attack) 03/25/2011   ? of due to transient diplopia  . Ulcerative colitis    left-sided/segmental, associated with diverticulosis.  Sulfasalazine per GI (Dr. Carlean Purl).  . Warthin's tumor     Past Surgical History:  Procedure Laterality Date  . carotid duplex dopplers  04/2014   No signif plaques; repeat 2 yrs per cardiology  . CATARACT EXTRACTION, BILATERAL    . COLONOSCOPY W/ BIOPSIES  12/19/2007   left-sided colitis, diverticulosis, hemorrhoids  . FLEXIBLE SIGMOIDOSCOPY  01/11/2008; 03/19/2010   2009 and 2011:segmental left colitis, diverticulosis, hemorrhoids  . INGUINAL HERNIA REPAIR     right  . MITRAL VALVE REPAIR  09/22/04   and modified Cox-Maze IV   .  MITRAL VALVE REPAIR  09/14/05   redo MV repair d/t endocardiits/embolic events  . PROSTATECTOMY  06/2003   Radical  . RIGHT HEART CATHETERIZATION N/A 01/01/2015   Procedure: RIGHT HEART CATH;  Surgeon: Peter M Martinique, MD;  Location: [facility] CATH LAB;  Service: Cardiovascular;  Laterality: N/A;  . SALIVARY GLAND SURGERY     left resection  . TRANSTHORACIC ECHOCARDIOGRAM  11/2013; 07/2017   2015: Mod LVH, EF 55-60%, no WM abnormalities, +LA dilation, +severe RV dilation and impaired systolic fxn, +increased pulm art pressures.  Valves ok.  2018- EF 60-65%, wall motion nl, s/p MV repair, mild RV syst dysf.      Outpatient Medications Prior to Visit  Medication Sig Dispense Refill  . albuterol (PROVENTIL HFA;VENTOLIN HFA) 108 (90 Base) MCG/ACT inhaler INHALE ONE PUFF BY MOUTH EVERY 6 HOURS AS NEEDED FOR WHEEZING AND FOR SHORTNESS OF BREATH (USING 5-6 TIMES EVERY 24 HOURS) 1 Inhaler 5  . albuterol (PROVENTIL HFA;VENTOLIN HFA) 108 (90 Base) MCG/ACT inhaler INHALE ONE PUFF BY MOUTH EVERY 6 HOURS AS NEEDED FOR WHEEZING AND FOR SHORTNESS OF BREATH (USING 5-6 TIMES EVERY 24 HOURS) 54 each 2  . albuterol (PROVENTIL) (2.5 MG/3ML) 0.083% nebulizer solution Take 3 mLs (2.5 mg total) by nebulization every 4 (four) hours. And as needed 300 mL 5  . budesonide (PULMICORT) 0.5 MG/2ML nebulizer solution USE ONE VIAL IN NEBULIZER TWICE DAILY 120 mL 5  . digoxin (LANOXIN) 0.125 MG tablet Take 1 tablet (0.125 mg total) by mouth daily. 90 tablet 3  . diltiazem (CARDIZEM CD) 240 MG 24 hr capsule Take 1 capsule (240 mg total) by mouth daily. 90 capsule 3  . glipiZIDE (GLUCOTROL XL) 2.5 MG 24 hr tablet Take 1 tablet by mouth daily.    Marland Kitchen LORazepam (ATIVAN) 0.5 MG tablet Take 1 tablet by mouth daily as needed.    . mometasone (NASONEX) 50 MCG/ACT nasal spray Place 2 sprays into the nose daily as needed.    . pravastatin (PRAVACHOL) 40 MG tablet TAKE 1 TABLET EVERY EVENING (Patient taking differently: Take 40 mg by mouth daily. TAKE 1 TABLET EVERY EVENING) 90 tablet 3  . predniSONE (DELTASONE) 20 MG tablet Take 20 mg by mouth daily with breakfast.    . STIOLTO RESPIMAT 2.5-2.5 MCG/ACT AERS INHALE 2 PUFFS INTO THE LUNGS DAILY. 12 g 3  . warfarin (COUMADIN) 5 MG tablet Take 0.5-1 tablets (2.5-5 mg total) by mouth See admin instructions. 5mg  daily except Friday take 2.5mg  AS DIRECTED BY COUMADIN CLINIC (Patient taking differently: Take 2.5-5 mg by mouth daily. ) 90 tablet 1  . furosemide (LASIX) 40 MG tablet Take 1 tablet (40 mg total) by mouth daily. 30 tablet 0   No facility-administered medications prior to visit.     No  Known Allergies  ROS As per HPI  PE: Blood pressure 119/66, pulse (!) 112, temperature (!) 97.5 F (36.4 C), temperature source Oral, resp. rate 16, height 5\' 8"  (1.727 m), weight 188 lb 8 oz (85.5 kg), SpO2 94 %. 2L [place] Gen: Alert, well appearing.  Patient is oriented to person, place, time, and situation. AFFECT: pleasant, lucid thought and speech. CV: irreg irreg, rate 85-90 by me, no m/r LUNGS: CTA bilat except insp crackles noted in L base posteriorly. Exp phase not signif prolonged.  No wheezing.  Nonlabored resps. EXT: no clubbing or cyanosis.  R LL 2+ pitting in ankle, L LL 1 + pitting in ankle.   LABS:    Chemistry  Component Value Date/Time   NA 139 04/17/2018 0852   K 3.9 04/17/2018 0852   CL 103 04/17/2018 0852   CO2 29 04/17/2018 0852   BUN 16 04/17/2018 0852   CREATININE 1.10 04/17/2018 0852      Component Value Date/Time   CALCIUM 9.6 04/17/2018 0852   ALKPHOS 68 03/01/2018 1125   AST 20 03/01/2018 1125   ALT 18 03/01/2018 1125   BILITOT 1.1 03/01/2018 1125     Lab Results  Component Value Date   HGBA1C 7.6 (H) 01/16/2018    IMPRESSION AND PLAN:  1) Chronic diastolic CHF, mild volume overload due to noncompliance with diet and diuretic. Much improved with 1 week of increased diuretic. BMET today. Resume lasix 40mg  ONCE a day.  2) Abnormal chest CT: LLL, infiltrate vs mass. Will repeat CT chest w/contrast as per radiologist's recommendation--to be done later today.  3) DM 2: HbA1c today.  An After Visit Summary was printed and given to the patient.  FOLLOW UP: Return in about 3 months (around 07/25/2018) for routine chronic illness f/u.  Signed:  Crissie Sickles, MD           04/24/2018

## 2018-04-25 ENCOUNTER — Encounter: Payer: Self-pay | Admitting: Family Medicine

## 2018-04-25 ENCOUNTER — Other Ambulatory Visit: Payer: Self-pay | Admitting: Family Medicine

## 2018-04-25 DIAGNOSIS — R9389 Abnormal findings on diagnostic imaging of other specified body structures: Secondary | ICD-10-CM

## 2018-04-25 DIAGNOSIS — R911 Solitary pulmonary nodule: Secondary | ICD-10-CM

## 2018-04-25 DIAGNOSIS — C61 Malignant neoplasm of prostate: Secondary | ICD-10-CM | POA: Diagnosis not present

## 2018-04-25 MED ORDER — CEFDINIR 300 MG PO CAPS: 300.0000 mg | ORAL_CAPSULE | Freq: Two times a day (BID) | ORAL | 0 refills | Status: DC

## 2018-04-25 MED ORDER — GLIPIZIDE ER 5 MG PO TB24: 5.0000 mg | ORAL_TABLET | Freq: Every day | ORAL | 1 refills | Status: DC

## 2018-05-01 ENCOUNTER — Ambulatory Visit (INDEPENDENT_AMBULATORY_CARE_PROVIDER_SITE_OTHER): Payer: Medicare Other | Admitting: Pharmacist

## 2018-05-01 DIAGNOSIS — J189 Pneumonia, unspecified organism: Secondary | ICD-10-CM | POA: Diagnosis not present

## 2018-05-01 DIAGNOSIS — I130 Hypertensive heart and chronic kidney disease with heart failure and stage 1 through stage 4 chronic kidney disease, or unspecified chronic kidney disease: Secondary | ICD-10-CM | POA: Diagnosis not present

## 2018-05-01 DIAGNOSIS — I482 Chronic atrial fibrillation, unspecified: Secondary | ICD-10-CM

## 2018-05-01 DIAGNOSIS — J9621 Acute and chronic respiratory failure with hypoxia: Secondary | ICD-10-CM | POA: Diagnosis not present

## 2018-05-01 DIAGNOSIS — Z5181 Encounter for therapeutic drug level monitoring: Secondary | ICD-10-CM | POA: Diagnosis not present

## 2018-05-01 DIAGNOSIS — I2510 Atherosclerotic heart disease of native coronary artery without angina pectoris: Secondary | ICD-10-CM | POA: Diagnosis not present

## 2018-05-01 DIAGNOSIS — J440 Chronic obstructive pulmonary disease with acute lower respiratory infection: Secondary | ICD-10-CM | POA: Diagnosis not present

## 2018-05-01 DIAGNOSIS — E1122 Type 2 diabetes mellitus with diabetic chronic kidney disease: Secondary | ICD-10-CM | POA: Diagnosis not present

## 2018-05-01 LAB — POCT INR: INR: 2.5 (ref 2.0–3.0)

## 2018-05-06 ENCOUNTER — Encounter: Payer: Self-pay | Admitting: Family Medicine

## 2018-05-07 NOTE — Progress Notes (Signed)
[address]       [place] [address]             [phone]                    Avian L Angelillo [place] Medical Record #[id number] Date of Birth: 04/08/1936  Referring: Tammi Sou, MD Primary Care: Tammi Sou, MD Primary Cardiologist: No primary care provider on file. Followed By  [facility]  Pulmonary  Chief Complaint:    Chief Complaint  Patient presents with  . Lung Lesion    Surgical eval Chest CT 616/19, 04/24/18, No PET Scan pending    History of Present Illness:    [name] 82 y.o. male is seen in the office  today for abnormal CT of the chest.  Patient has a past medical history significant for COPD steroid and O2 dependent baseline, known coronary artery disease history of congestive heart failure, previous mitral valve repair with maze, chronic anticoagulation with Coumadin for atrial fibrillation, and diabetes.  Patient had mitral valve repair in 2004 and then subsequent reoperation 8 months later, during this.  He also had episode of fungal endocarditis   In June the patient was admitted at [facility] because of increasing respiratory distress, increasing shortness of breath and wheezing.  During the hospitalization a CT of the chest was performed suggesting left lower lobe mass.  Follow-up CT scan of the chest was done recently and the patient referred to surgery.     Current Activity/ Functional Status:  Patient is independent with mobility/ambulation, transfers, ADL's, IADL's.   Zubrod Score: At the time of surgery this patient's most appropriate activity status/level should be described as: []     0    Normal activity, no symptoms []     1    Restricted in physical strenuous activity but ambulatory, able to do out light work []     2    Ambulatory and capable of self care, unable to do work activities, up and about               >50 % of waking hours                              [x]     3    Only limited self care, in bed  greater than 50% of waking hours []     4    Completely disabled, no self care, confined to bed or chair []     5    Moribund   Past Medical History:  Diagnosis Date  . Adrenal adenoma   . Atrial fibrillation (HCC)    Rate control + Coumadin  . Atrial flutter ([place])   . CAD (coronary artery disease)    Lexiscan Myoview (01/2014): Lateral soft tissue attenuation, no ischemia, not gated, low risk  . Cancer ([place])   . CAP (community acquired pneumonia)    Hospitalized 10/2014  . Chronic diastolic heart failure ([place])   . Chronic hypoxemic respiratory failure (HCC)    from COPD  . Chronic renal insufficiency, stage III (moderate) (HCC)    CrCl 50s  . COPD (chronic obstructive pulmonary disease) (HCC)    oxygen-dependent (2.5 L 24/7).  Improved on starting chronic prednisone 06/2017 (increased to 20 mg qd 08/2017)  Progressive worsening dyspnea at pulm f/u 10/19/17--? changed to continuous 02 from pulsed delivery (?)--no change in med  regimen.  . Diabetes mellitus without complication ([place]) 03/47/4259   A1c 6.5 % 05/2016  . Diverticulosis   . DOE (dyspnea on exertion)    Multifactorial: deconditioning, COPD, obesity hypoventilation syndrome.  Marland Kitchen Dyslipidemia   . Embolic stroke ([place])    d/t endocarditis 2008  . Hemorrhoids   . History of mitral valve replacement    needs SBE prophylaxis  . History of prostate cancer remote past   f/u by Dr. Claris Che; no recurrence as of f/u 10/2015.  PSA rising from 2013 to 04/2018 (PSA 04/25/18=18.49): urol to rpt PSA and check CT and bone scan  approx 10/2018.  Marland Kitchen HTN (hypertension)   . Left lower lobe pulmonary nodule July/Aug 2019.   04/2018: enlarging irregular nodular opacity--referred to CT surgery 04/25/18.  . Mediastinal lymphadenopathy   . Pulmonary hypertension ([place]) 01/09/2015   secondary  . Right thyroid nodule   . Thrombocytopenia ([place])   . TIA (transient ischemic attack) 03/25/2011   ? of due to transient diplopia  . Ulcerative colitis     left-sided/segmental, associated with diverticulosis.  Sulfasalazine per GI (Dr. Carlean Purl).  . Warthin's tumor     Past Surgical History:  Procedure Laterality Date  . carotid duplex dopplers  04/2014   No signif plaques; repeat 2 yrs per cardiology  . CATARACT EXTRACTION, BILATERAL    . COLONOSCOPY W/ BIOPSIES  12/19/2007   left-sided colitis, diverticulosis, hemorrhoids  . FLEXIBLE SIGMOIDOSCOPY  01/11/2008; 03/19/2010   2009 and 2011:segmental left colitis, diverticulosis, hemorrhoids  . INGUINAL HERNIA REPAIR     right  . MITRAL VALVE REPAIR  09/22/04   and modified Cox-Maze IV   . MITRAL VALVE REPAIR  09/14/05   redo MV repair d/t endocardiits/embolic events  . PROSTATECTOMY  06/2003   Radical  . RIGHT HEART CATHETERIZATION N/A 01/01/2015   Procedure: RIGHT HEART CATH;  Surgeon: Peter M Martinique, MD;  Location: [facility] CATH LAB;  Service: Cardiovascular;  Laterality: N/A;  . SALIVARY GLAND SURGERY     left resection  . TRANSTHORACIC ECHOCARDIOGRAM  11/2013; 07/2017   2015: Mod LVH, EF 55-60%, no WM abnormalities, +LA dilation, +severe RV dilation and impaired systolic fxn, +increased pulm art pressures.  Valves ok.  2018- EF 60-65%, wall motion nl, s/p MV repair, mild RV syst dysf.     Family History  Problem Relation Age of Onset  . Cancer Mother        spinal  . Stroke Father   . Heart disease Father   . Colon cancer Neg Hx   . Heart attack Neg Hx      Social History   Tobacco Use  Smoking Status Former Smoker  . Packs/day: 3.00  . Years: 60.00  . Pack years: 180.00  . Types: Cigarettes  . Last attempt to quit: 09/19/2002  . Years since quitting: 15.6  Smokeless Tobacco Never Used    Social History   Substance and Sexual Activity  Alcohol Use No  . Alcohol/week: 0.0 standard drinks     No Known Allergies  Current Outpatient Medications  Medication Sig Dispense Refill  . albuterol (PROVENTIL HFA;VENTOLIN HFA) 108 (90 Base) MCG/ACT inhaler INHALE ONE PUFF BY MOUTH  EVERY 6 HOURS AS NEEDED FOR WHEEZING AND FOR SHORTNESS OF BREATH (USING 5-6 TIMES EVERY 24 HOURS) 1 Inhaler 5  . albuterol (PROVENTIL) (2.5 MG/3ML) 0.083% nebulizer solution Take 3 mLs (2.5 mg total) by nebulization every 4 (four) hours. And as needed 300 mL 5  . budesonide (PULMICORT) 0.5  MG/2ML nebulizer solution USE ONE VIAL IN NEBULIZER TWICE DAILY 120 mL 5  . digoxin (LANOXIN) 0.125 MG tablet Take 1 tablet (0.125 mg total) by mouth daily. 90 tablet 3  . diltiazem (CARDIZEM CD) 240 MG 24 hr capsule Take 1 capsule (240 mg total) by mouth daily. 90 capsule 3  . furosemide (LASIX) 40 MG tablet Take 1 tablet (40 mg total) by mouth daily. 30 tablet 0  . glipiZIDE (GLUCOTROL XL) 5 MG 24 hr tablet Take 1 tablet (5 mg total) by mouth daily with breakfast. 90 tablet 1  . LORazepam (ATIVAN) 0.5 MG tablet Take 1 tablet by mouth daily as needed.    . pravastatin (PRAVACHOL) 40 MG tablet TAKE 1 TABLET EVERY EVENING (Patient taking differently: Take 40 mg by mouth daily. TAKE 1 TABLET EVERY EVENING) 90 tablet 3  . predniSONE (DELTASONE) 20 MG tablet Take 20 mg by mouth daily with breakfast.    . STIOLTO RESPIMAT 2.5-2.5 MCG/ACT AERS INHALE 2 PUFFS INTO THE LUNGS DAILY. 12 g 3  . warfarin (COUMADIN) 5 MG tablet Take 0.5-1 tablets (2.5-5 mg total) by mouth See admin instructions. 5mg  daily except Friday take 2.5mg  AS DIRECTED BY COUMADIN CLINIC (Patient taking differently: Take 2.5-5 mg by mouth daily. ) 90 tablet 1   No current facility-administered medications for this visit.     Pertinent items are noted in HPI.   Review of Systems:     Cardiac Review of Systems: [Y] = yes  or   [ N ] = no   Chest Pain [   n ]  Resting SOB Blue.Reese ] Exertional SOB  Blue.Reese  ]  Orthopnea [ n ]   Pedal Edema Blue.Reese   ]    Palpitations Blue.Reese ] Syncope  Florencio.Farrier ]   Presyncope Florencio.Farrier   ]   General Review of Systems: [Y] = yes [  ]=no Constitional: recent weight change [  ];  Wt loss over the last 3 months [   ] anorexia [  ]; fatigue Blue.Reese  ]; nausea  [  ]; night sweats [  ]; fever [  ]; or chills [  ];           Eye : blurred vision [  ]; diplopia [   ]; vision changes [  ];  Amaurosis fugax[  ]; Resp: cough [ y ];  wheezing[ y ];  hemoptysis[  ]; shortness of breath[  y]; paroxysmal nocturnal dyspnea[y  ]; dyspnea on exertion[ y ]; or orthopnea[ y ];  GI:  gallstones[  ], vomiting[  ];  dysphagia[  ]; melena[  ];  hematochezia [  ]; heartburn[  ];   Hx of  Colonoscopy[  ]; GU: kidney stones [  ]; hematuria[  ];   dysuria [  ];  nocturia[  ];  history of     obstruction [  ]; urinary frequency [  ]             Skin: rash, swelling[  ];, hair loss[  ];  peripheral edema[  ];  or itching[  ]; Musculosketetal: myalgias[  ];  joint swelling[  ];  joint erythema[  ];  joint pain[  ];  back pain[  ];  Heme/Lymph: bruising[  ];  bleeding[  ];  anemia[  ];  Neuro: TIA[  ];  headaches[  ];  stroke[  ];  vertigo[  ];  seizures[  ];   paresthesias[  ];  difficulty walking[y  ];  Psych:depression[  ]; anxiety[  ];  Endocrine: diabetes[ y ];  thyroid dysfunction[  ];  Immunizations: Flu up to date [  ]; Pneumococcal up to date [  ];  Other:     PHYSICAL EXAMINATION: BP 120/69 (BP Location: Left Arm, Patient Position: Sitting, Cuff Size: Large)   Pulse 93   Resp (!) 22   Ht 5\' 8"  (1.727 m)   Wt 190 lb (86.2 kg)   SpO2 (!) 88% Comment: 88-91% on 3L O2  BMI 28.89 kg/m  General appearance: alert, cooperative, appears older than stated age and mild distress Head: Normocephalic, without obvious abnormality, atraumatic Neck: no adenopathy, no carotid bruit, no JVD, supple, symmetrical, trachea midline and thyroid not enlarged, symmetric, no tenderness/mass/nodules Lymph nodes: Cervical, supraclavicular, and axillary nodes normal. Resp: diminished breath sounds bibasilar Cardio: irregularly irregular rhythm GI: soft, non-tender; bowel sounds normal; no masses,  no organomegaly Extremities: edema Bilateral 2+ lower extremity Neurologic: Grossly  normal On 3 L patient's O2 sats are 88%  Diagnostic Studies & Laboratory data:     Recent Radiology Findings:   Ct Chest W Contrast  Result Date: 04/24/2018 CLINICAL DATA:  Abnormal opacity on recent chest CT EXAM: CT CHEST WITH CONTRAST TECHNIQUE: Multidetector CT imaging of the chest was performed during intravenous contrast administration. CONTRAST:  5mL ISOVUE-300 IOPAMIDOL (ISOVUE-300) INJECTION 61% COMPARISON:  Chest CT March 04, 2018 FINDINGS: Cardiovascular: There is no thoracic aortic aneurysm or dissection. There are foci of atherosclerotic calcification in the visualized proximal great vessels without hemodynamically significant obstruction in these areas. There is aortic atherosclerosis as well as multiple foci of coronary artery calcification. There is no pericardial effusion or pericardial thickening evident. Patient is status post mitral valve replacement. There is prominence of the main pulmonary outflow tract measuring 3.2 cm in diameter. Mediastinum/Nodes: Thyroid appears unremarkable. There are scattered subcentimeter lymph nodes throughout the mediastinum and hilar regions. There is a subcarinal lymph node measuring 1.5 x 1.5 cm, essentially stable. No new adenopathy is appreciable. No esophageal lesions are evident. Lungs/Pleura: There is underlying centrilobular and paraseptal emphysematous change. There has been an increase in consolidation in the left lower lobe, lateral segment. The confluent somewhat irregular nodular opacity in this area measures approximately 5.2 x 3.0 x 2.5 cm. Nodularity along the periphery of this area is also noted, increased from recent study. A previously noted nodular opacity in the anterior segment of the right upper lobe seen on axial slice 32 series 3 measures 4 mm, unchanged. No pleural effusion or pleural thickening evident. There is scarring in the lung bases, stable. Upper Abdomen: There is atherosclerotic calcification in the visualized upper  abdominal aorta. Visualized upper abdominal structures otherwise appear unremarkable. Musculoskeletal: Patient is status post median sternotomy. No evident blastic or lytic bone lesions. No chest wall lesions are evident. IMPRESSION: 1. The previously noted area of consolidation in the left lower lobe appears larger and somewhat more irregular with peripheral nodularity, new from prior study. While this area may represent progression of pneumonia, concern for underlying neoplasm is heightened given the progression since prior study. Given this concern for potential neoplasm, it may be prudent to consider bronchoscopy with bronchial washings to further evaluate. It also may be prudent to consider nuclear medicine PET study to determine area of maximal metabolic activity to assist in targeting optimal tissue sampling site should biopsy become warranted. If this area of consolidation/irregular opacity should prove to represent pneumonia, continued surveillance clearing is felt to be advisable, as a neoplasm  easily could be obscured in this circumstance. 2. Underlying centrilobular and paraseptal emphysematous change. Scattered areas of scarring noted. Stable 4 mm nodular opacity right upper lobe. 3. Stable sub-carinal lymph node prominence. Scattered subcentimeter lymph nodes elsewhere in the chest remains stable. 4. Aortic atherosclerosis. Foci of great vessel calcification and coronary artery calcification noted. Status post mitral valve replacement. 5. Prominence the main pulmonary outflow tract, a finding concerning for underlying pulmonary arterial hypertension. Aortic Atherosclerosis (ICD10-I70.0) and Emphysema (ICD10-J43.9). These results will be called to the ordering clinician or representative by the Radiologist Assistant, and communication documented in the PACS or zVision Dashboard. Electronically Signed   By: Lowella Grip III M.D.   On: 04/24/2018 14:01     I have independently reviewed the above  radiology studies  and reviewed the findings with the patient.   Recent Lab Findings: Lab Results  Component Value Date   WBC 10.3 03/07/2018   HGB 12.0 (L) 03/07/2018   HCT 37.0 (L) 03/07/2018   PLT 141 (L) 03/07/2018   GLUCOSE 155 (H) 04/24/2018   CHOL 159 10/16/2017   TRIG 224.0 (H) 10/16/2017   HDL 46.50 10/16/2017   LDLDIRECT 88.0 10/16/2017   LDLCALC 79 10/12/2016   ALT 18 03/01/2018   AST 20 03/01/2018   NA 141 04/24/2018   K 3.6 04/24/2018   CL 100 04/24/2018   CREATININE 1.11 04/24/2018   BUN 16 04/24/2018   CO2 31 04/24/2018   TSH 3.24 12/09/2014   INR 2.5 05/01/2018   HGBA1C 8.7 (H) 04/24/2018   Echo: 03/05/2018          *[facility] [employer].                        [place], [place] [address]                            [phone]  ------------------------------------------------------------------- Transthoracic Echocardiography  Patient:    Shaun, [name] MR #:       [id number] Study Date: 03/05/2018 Gender:     M Age:        45 Height:     172.7 cm Weight:     84.5 kg BSA:        2.03 m^2 Pt. Status: Room:       1429   ADMITTING    Luvenia Heller  REFERRING    Etta Quill  SONOGRAPHER  Merrie Roof, RDCS  PERFORMING   [facility], Inpatient  ATTENDING    Alma Friendly  cc:  ------------------------------------------------------------------- LV EF: 60% -   65%  ------------------------------------------------------------------- Indications:      424.0 Mitral valve disease.  ------------------------------------------------------------------- History:   PMH:  CAP. AKI.  Atrial fibrillation.  Congestive heart failure.  Chronic obstructive pulmonary disease.  Risk factors: Diabetes mellitus.  ------------------------------------------------------------------- Study Conclusions  - Left ventricle: The cavity size was  normal. Wall thickness was   increased in a pattern of severe LVH. Systolic function was   normal. The estimated ejection fraction was in the range of 60%   to 65%. - Aortic valve:  There was mild regurgitation. - Mitral valve: Post MV repair with continue elevated gradients   previous echo 07/19/17 mean gradient was 14 mmHg and peak 25   mmHg. ? rhythm rapid atrial fibrillation rate control may be of   benefit Onlty trivial residual MR post repair. Valve area by   continuity equation (using LVOT flow): 1.09 cm^2. - Left atrium: The atrium was moderately dilated. - Right atrium: The atrium was mildly dilated. - Atrial septum: No defect or patent foramen ovale was identified. - Pulmonary arteries: PA peak pressure: 72 mm Hg (S).  ------------------------------------------------------------------- Labs, prior tests, procedures, and surgery: S/P Mitral valve repair.  ------------------------------------------------------------------- Study data:  Comparison was made to the study of 07/19/2017.  Study status:  Routine.  Procedure:  The patient reported no pain pre or post test. Transthoracic echocardiography. Image quality was adequate.  Study completion:  There were no complications. Transthoracic echocardiography.  M-mode, complete 2D, spectral Doppler, and color Doppler.  Birthdate:  Patient birthdate: 08-Sep-1936.  Age:  Patient is 82 yr old.  Sex:  Gender: male. BMI: 28.3 kg/m^2.  Blood pressure:     138/76  Patient status: Inpatient.  Study date:  Study date: 03/05/2018. Study time: 09:58 AM.  Location:  Bedside.  -------------------------------------------------------------------  ------------------------------------------------------------------- Left ventricle:  The cavity size was normal. Wall thickness was increased in a pattern of severe LVH. Systolic function was normal. The estimated ejection fraction was in the range of 60% to  65%.  ------------------------------------------------------------------- Aortic valve:   Trileaflet; moderately thickened, moderately calcified leaflets.  Doppler:  There was mild regurgitation.   ------------------------------------------------------------------- Mitral valve:  Post MV repair with continue elevated gradients previous echo 07/19/17 mean gradient was 14 mmHg and peak 25 mmHg. ? rhythm rapid atrial fibrillation rate control may be of benefit Onlty trivial residual MR post repair.  Doppler:  There was trivial regurgitation.    Valve area by continuity equation (using LVOT flow): 1.09 cm^2. Indexed valve area by continuity equation (using LVOT flow): 0.54 cm^2/m^2.    Mean gradient (D): 12 mm Hg.  ------------------------------------------------------------------- Left atrium:  The atrium was moderately dilated.  ------------------------------------------------------------------- Atrial septum:  No defect or patent foramen ovale was identified.   ------------------------------------------------------------------- Right ventricle:  The cavity size was normal. Wall thickness was normal. Systolic function was normal.  ------------------------------------------------------------------- Pulmonic valve:    Doppler:  There was mild regurgitation.  ------------------------------------------------------------------- Tricuspid valve:   Doppler:  There was mild regurgitation.  ------------------------------------------------------------------- Right atrium:  The atrium was mildly dilated.  ------------------------------------------------------------------- Pericardium:  The pericardium was normal in appearance.  ------------------------------------------------------------------- Systemic veins: Inferior vena cava: The vessel was normal in size. The respirophasic diameter changes were in the normal range (>= 50%), consistent with normal central venous  pressure.  ------------------------------------------------------------------- Measurements   Left ventricle                           Value          Reference  LV ID, ED, PLAX chordal          (L)     41    mm       43 - 52  LV ID, ES, PLAX chordal                  27    mm       23 - 38  LV fx shortening, PLAX chordal  34    %        >=29  LV PW thickness, ED                      16    mm       ----------  IVS/LV PW ratio, ED                      1.06           <=1.3  Stroke volume, 2D                        74    ml       ----------  Stroke volume/bsa, 2D                    36    ml/m^2   ----------    Ventricular septum                       Value          Reference  IVS thickness, ED                        17    mm       ----------    LVOT                                     Value          Reference  LVOT ID, S                               24    mm       ----------  LVOT area                                4.52  cm^2     ----------  LVOT peak velocity, S                    95.7  cm/s     ----------  LVOT mean velocity, S                    62.8  cm/s     ----------  LVOT VTI, S                              16.3  cm       ----------  LVOT peak gradient, S                    4     mm Hg    ----------    Aorta                                    Value          Reference  Aortic root ID, ED                       36    mm       ----------  Left atrium                              Value          Reference  LA ID, A-P, ES                           54    mm       ----------  LA ID/bsa, A-P                   (H)     2.66  cm/m^2   <=2.2  LA volume, S                             73    ml       ----------  LA volume/bsa, S                         35.9  ml/m^2   ----------  LA volume, ES, 1-p A4C                   91.8  ml       ----------  LA volume/bsa, ES, 1-p A4C               45.2  ml/m^2   ----------  LA volume, ES, 1-p A2C                   56.5  ml       ----------   LA volume/bsa, ES, 1-p A2C               27.8  ml/m^2   ----------    Mitral valve                             Value          Reference  Mitral mean velocity, D                  159   cm/s     ----------  Mitral mean gradient, D                  12    mm Hg    ----------  Mitral valve area, LVOT                  1.09  cm^2     ----------  continuity  Mitral valve area/bsa, LVOT              0.54  cm^2/m^2 ----------  continuity  Mitral annulus VTI, D                    67.3  cm       ----------    Pulmonary arteries                       Value          Reference  PA pressure, S, DP               (H)     72    mm Hg    <=30    Tricuspid valve  Value          Reference  Tricuspid regurg peak velocity           376   cm/s     ----------  Tricuspid peak RV-RA gradient            57    mm Hg    ----------    Right atrium                             Value          Reference  RA ID, S-I, ES, A4C              (H)     59    mm       34 - 49  RA area, ES, A4C                 (H)     27.9  cm^2     8.3 - 19.5  RA volume, ES, A/L                       105   ml       ----------  RA volume/bsa, ES, A/L                   51.7  ml/m^2   ----------    Systemic veins                           Value          Reference  Estimated CVP                            15    mm Hg    ----------    Right ventricle                          Value          Reference  RV ID, minor axis, ED, A4C base          39    mm       ----------  TAPSE                                    11.7  mm       ----------  RV pressure, S, DP               (H)     72    mm Hg    <=30  RV s&', lateral, S                        8.05  cm/s     ----------  Legend: (L)  and  (H)  mark values outside specified reference range.  ------------------------------------------------------------------- Prepared and Electronically Authenticated by  Jenkins Rouge, M.D. 2019-06-17T12:27:11 Assessment / Plan:   Increasing  complex nodular density left lower lobe, possible inflammatory versus malignant-we will obtain a PET scan and then further evaluation for biopsy strategies.  The patient is not a candidate for any surgical procedures-we will plan to see after the PET scan is performed and then review interventional radiology concerning CT-guided biopsy Severe  COPD steroid and O2 dependent  Long-term anticoagulation for chronic atrial fibrillation  Post MV repair with continue elevated gradients with chronic systolic and diastolic heart failure    previous echo 07/19/17 mean gradient was 14 mmHg and peak 25   mmHg.-   I  spent 45 minutes with  the patient face to face and greater then 50% of the time was spent in counseling and coordination of care.    Grace Isaac MD      [address] [place] [address] Office [phone]   Beeper [phone]  05/08/2018 11:26 AM

## 2018-05-08 ENCOUNTER — Other Ambulatory Visit: Payer: Self-pay

## 2018-05-08 ENCOUNTER — Other Ambulatory Visit: Payer: Self-pay | Admitting: *Deleted

## 2018-05-08 ENCOUNTER — Encounter: Payer: Self-pay | Admitting: Cardiothoracic Surgery

## 2018-05-08 ENCOUNTER — Institutional Professional Consult (permissible substitution) (INDEPENDENT_AMBULATORY_CARE_PROVIDER_SITE_OTHER): Payer: Medicare Other | Admitting: Cardiothoracic Surgery

## 2018-05-08 ENCOUNTER — Encounter: Payer: Self-pay | Admitting: Thoracic Surgery (Cardiothoracic Vascular Surgery)

## 2018-05-08 VITALS — BP 120/69 | HR 93 | Resp 22 | Ht 68.0 in | Wt 190.0 lb

## 2018-05-08 DIAGNOSIS — Z9889 Other specified postprocedural states: Secondary | ICD-10-CM | POA: Diagnosis not present

## 2018-05-08 DIAGNOSIS — J439 Emphysema, unspecified: Secondary | ICD-10-CM

## 2018-05-08 DIAGNOSIS — I509 Heart failure, unspecified: Secondary | ICD-10-CM | POA: Diagnosis not present

## 2018-05-08 DIAGNOSIS — R911 Solitary pulmonary nodule: Secondary | ICD-10-CM

## 2018-05-08 DIAGNOSIS — H65492 Other chronic nonsuppurative otitis media, left ear: Secondary | ICD-10-CM | POA: Diagnosis not present

## 2018-05-08 DIAGNOSIS — J9621 Acute and chronic respiratory failure with hypoxia: Secondary | ICD-10-CM | POA: Diagnosis not present

## 2018-05-08 DIAGNOSIS — I2510 Atherosclerotic heart disease of native coronary artery without angina pectoris: Secondary | ICD-10-CM | POA: Diagnosis not present

## 2018-05-08 DIAGNOSIS — H6983 Other specified disorders of Eustachian tube, bilateral: Secondary | ICD-10-CM | POA: Diagnosis not present

## 2018-05-08 DIAGNOSIS — Z87891 Personal history of nicotine dependence: Secondary | ICD-10-CM | POA: Diagnosis not present

## 2018-05-12 ENCOUNTER — Other Ambulatory Visit: Payer: Self-pay | Admitting: Emergency Medicine

## 2018-05-14 NOTE — Progress Notes (Deleted)
@Patient  ID: [name], male    DOB: 11/28/1935, 82 y.o.   MRN: [id number]  No chief complaint on file.   Referring provider: Tammi Sou, MD  HPI: 82 year old male former smoker followed in our office for COPD and respiratory failure (3 L O2), pulmonary hypertension Act score: 20 Plan: Rest, PMH: A. Fib (on Coumadin), mitral valve repair, CAD, CVA Smoker/ Smoking History: Former smoker Maintenance: Pulmicort neb, Stiolto Pt of: Dr. Lamonte Sakai  Recent [place] [facility] Encounters:   12/29/2017-office visit-[place] Patient following up with severe obstruction COPD associated with hypoxemic respiratory failure. Plan: Continue Stiolto desonide, continue prednisone 20 mg daily, continue oxygen 3 L continuous follow-up with Dr. Lamonte Sakai in 4 months  02/02/2018-office visit-[facility] Patient returns for one-month follow-up patient has moderate COPD.  Patient remains on Pulmicort nebs twice daily, Stiolto.  Patient also use albuterol inhaler 4-5 times a day.  Appears patient is taking his medications correctly.  Patient remains on oxygen 3 L. Plan: Spirometry on return, continue Stiolto, continue Pulmicort twice daily, continue Mucinex, continue oxygen follow-up with Dr. Lamonte Sakai in 3 months   05/14/2018  - Visit        Tests:   04/24/2018-CT chest with contrast- area of consolidation in left lower lobe appears larger and somewhat more irregular with peripheral nodularity this is new from prior study while this may represent progression of pneumonia concern for underlying neoplasm is heightened given the progression since prior study, underlying centrilobular and paraseptal emphysematous change 03/04/2018-CT chest with contrast- irregular and somewhat nodular spiculated fossae in the airspace the left lower lobe, possibly due to pneumonia however lung neoplasm is also a concern short interval CT in 4 to 6 weeks 03/05/2018-echocardiogram-LV ejection fraction 60 to 65%, severe LVH, PA peak pressure  72     Chart Review:  05/08/2018- CVTS-clear heart- patient is not a candidate for surgical procedures, patient needs to receive PET scan 03/05/2018-respiratory panel-rhinovirus/enterovirus detected 05/08/2018- redo median sternotomy for redo mitral valve repair, performed by Dr. 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 Influenza Whole 06/05/2008, 09/28/2008, 07/31/2012  . Influenza, High Dose Seasonal PF 06/27/2013, 06/12/2014, 06/05/2015, 06/13/2016, 07/03/2017  . Pneumococcal Conjugate-13 12/03/2014  . Pneumococcal-Unspecified 09/19/2008  . Tdap 06/05/2015    Past Medical History:  Diagnosis Date  . Adrenal adenoma   . Atrial fibrillation (HCC)    Rate control + Coumadin  . Atrial flutter ([place])   . CAD (coronary artery disease)    Lexiscan Myoview (01/2014): Lateral soft tissue attenuation, no ischemia, not gated, low risk  . Cancer ([place])   . CAP (community acquired pneumonia)    Hospitalized 10/2014  . Chronic diastolic heart failure ([place])   . Chronic hypoxemic respiratory failure (HCC)    from COPD  . Chronic renal insufficiency, stage III (moderate) (HCC)    CrCl 50s  . COPD (chronic obstructive pulmonary disease) (HCC)    oxygen-dependent (2.5 L 24/7).  Improved on starting chronic prednisone 06/2017 (increased to 20 mg qd 08/2017)  Progressive worsening dyspnea at pulm f/u 10/19/17--? changed to continuous 02 from pulsed delivery (?)--no change in med regimen.  . Diabetes mellitus without complication ([place]) 82/95/6213   A1c 6.5 % 05/2016  . Diverticulosis   . DOE (dyspnea on exertion)    Multifactorial: deconditioning, COPD, obesity hypoventilation syndrome.  Marland Kitchen Dyslipidemia   . Embolic stroke ([place])  d/t endocarditis 2008  . Hemorrhoids   . History of mitral valve replacement    needs SBE prophylaxis  .  History of prostate cancer remote past   f/u by Dr. Claris Che; no recurrence as of f/u 10/2015.  PSA rising from 2013 to 04/2018 (PSA 04/25/18=18.49): urol to rpt PSA and check CT and bone scan  approx 10/2018.  Marland Kitchen HTN (hypertension)   . Left lower lobe pulmonary nodule July/Aug 2019.   04/2018: enlarging irregular nodular opacity--referred to CT surgery 04/25/18.  . Mediastinal lymphadenopathy   . Pulmonary hypertension ([place]) 01/09/2015   secondary  . Right thyroid nodule   . Thrombocytopenia ([place])   . TIA (transient ischemic attack) 03/25/2011   ? of due to transient diplopia  . Ulcerative colitis    left-sided/segmental, associated with diverticulosis.  Sulfasalazine per GI (Dr. Carlean Purl).  . Warthin's tumor     Tobacco History: Social History   Tobacco Use  Smoking Status Former Smoker  . Packs/day: 3.00  . Years: 60.00  . Pack years: 180.00  . Types: Cigarettes  . Last attempt to quit: 09/19/2002  . Years since quitting: 15.6  Smokeless Tobacco Never Used   Counseling given: Not Answered   Outpatient Encounter Medications as of 05/15/2018  Medication Sig  . albuterol (PROVENTIL HFA;VENTOLIN HFA) 108 (90 Base) MCG/ACT inhaler INHALE ONE PUFF BY MOUTH EVERY 6 HOURS AS NEEDED FOR WHEEZING AND FOR SHORTNESS OF BREATH (USING 5-6 TIMES EVERY 24 HOURS)  . albuterol (PROVENTIL) (2.5 MG/3ML) 0.083% nebulizer solution Take 3 mLs (2.5 mg total) by nebulization every 4 (four) hours. And as needed  . budesonide (PULMICORT) 0.5 MG/2ML nebulizer solution USE ONE VIAL IN NEBULIZER TWICE DAILY  . digoxin (LANOXIN) 0.125 MG tablet Take 1 tablet (0.125 mg total) by mouth daily.  Marland Kitchen diltiazem (CARDIZEM CD) 240 MG 24 hr capsule Take 1 capsule (240 mg total) by mouth daily.  . furosemide (LASIX) 40 MG tablet Take 1 tablet (40 mg total) by mouth daily.  Marland Kitchen glipiZIDE (GLUCOTROL XL) 5 MG 24 hr tablet Take 1 tablet (5 mg total) by mouth daily with breakfast.  . LORazepam (ATIVAN) 0.5 MG tablet Take 1 tablet by  mouth daily as needed.  . pravastatin (PRAVACHOL) 40 MG tablet TAKE 1 TABLET EVERY EVENING (Patient taking differently: Take 40 mg by mouth daily. TAKE 1 TABLET EVERY EVENING)  . predniSONE (DELTASONE) 20 MG tablet Take 20 mg by mouth daily with breakfast.  . STIOLTO RESPIMAT 2.5-2.5 MCG/ACT AERS INHALE 2 PUFFS INTO THE LUNGS DAILY.  Marland Kitchen warfarin (COUMADIN) 5 MG tablet Take 0.5-1 tablets (2.5-5 mg total) by mouth See admin instructions. 5mg  daily except Friday take 2.5mg  AS DIRECTED BY COUMADIN CLINIC (Patient taking differently: Take 2.5-5 mg by mouth daily. )   No facility-administered encounter medications on file as of 05/15/2018.      Review of Systems  Review of Systems    Constitutional:   No  weight loss, night sweats,  fevers, chills, fatigue, or  lassitude HEENT:   No headaches,  Difficulty swallowing,  Tooth/dental problems, or  Sore throat, No sneezing, itching, ear ache, nasal congestion, post nasal drip  CV: No chest pain,  orthopnea, PND, swelling in lower extremities, anasarca, dizziness, palpitations, syncope  GI: No heartburn, indigestion, abdominal pain, nausea, vomiting, diarrhea, change in bowel habits, loss of appetite, bloody stools Resp: No shortness of breath with exertion or at rest.  No excess mucus, no productive cough,  No non-productive cough,  No coughing up  of blood.  No change in color of mucus.  No wheezing.  No chest wall deformity Skin: no rash, lesions, no skin changes. GU: no dysuria, change in color of urine, no urgency or frequency.  No flank pain, no hematuria  MS:  No joint pain or swelling.  No decreased range of motion.  No back pain. Psych:  No change in mood or affect. No depression or anxiety.  No memory loss.      Physical Exam  There were no vitals taken for this visit.  Wt Readings from Last 5 Encounters:  05/08/18 190 lb (86.2 kg)  04/24/18 188 lb 8 oz (85.5 kg)  04/17/18 190 lb 6 oz (86.4 kg)  03/15/18 185 lb 2 oz (84 kg)    03/05/18 186 lb 4.6 oz (84.5 kg)     Physical Exam    GEN: A/Ox3; pleasant , NAD, well nourished, appears stated age 33:  [place]/AT,  EACs-clear, TMs-wnl, NOSE-clear, THROAT-clear, no lesions, no postnasal drip or exudate noted.  NECK:  Supple w/ fair ROM; no JVD; normal carotid impulses w/o bruits; no thyromegaly or nodules palpated; no lymphadenopathy.   RESP  Clear  P & A; w/o, wheezes/ rales/ or rhonchi. no accessory muscle use, no dullness to percussion CARD:  RRR, no m/r/g, no peripheral edema, pulses intact: radial 2+ bilaterally, DP 2+ bilaterally, no cyanosis or clubbing. GI:   Soft & nt; nml bowel sounds; no organomegaly or masses detected.  Musco: Warm bilaterally, no deformities or joint swelling noted.  Neuro: alert, no focal deficits noted.   Skin: Warm, no lesions or rashes     Lab Results:  CBC    Component Value Date/Time   WBC 10.3 03/07/2018 0421   RBC 4.21 (L) 03/07/2018 0421   HGB 12.0 (L) 03/07/2018 0421   HCT 37.0 (L) 03/07/2018 0421   PLT 141 (L) 03/07/2018 0421   MCV 87.9 03/07/2018 0421   MCH 28.5 03/07/2018 0421   MCHC 32.4 03/07/2018 0421   RDW 14.9 03/07/2018 0421   LYMPHSABS 1.6 03/07/2018 0421   MONOABS 0.8 03/07/2018 0421   EOSABS 0.0 03/07/2018 0421   BASOSABS 0.0 03/07/2018 0421    BMET    Component Value Date/Time   NA 141 04/24/2018 0832   K 3.6 04/24/2018 0832   CL 100 04/24/2018 0832   CO2 31 04/24/2018 0832   GLUCOSE 155 (H) 04/24/2018 0832   BUN 16 04/24/2018 0832   CREATININE 1.11 04/24/2018 0832   CALCIUM 9.5 04/24/2018 0832   GFRNONAA 55 (L) 03/07/2018 0421   GFRAA >60 03/07/2018 0421    BNP    Component Value Date/Time   BNP 144.2 (H) 03/04/2018 1803    ProBNP    Component Value Date/Time   PROBNP 48.0 12/09/2014 0924    Imaging: Ct Chest W Contrast  Result Date: 04/24/2018 CLINICAL DATA:  Abnormal opacity on recent chest CT EXAM: CT CHEST WITH CONTRAST TECHNIQUE: Multidetector CT imaging of the chest  was performed during intravenous contrast administration. CONTRAST:  75mL ISOVUE-300 IOPAMIDOL (ISOVUE-300) INJECTION 61% COMPARISON:  Chest CT March 04, 2018 FINDINGS: Cardiovascular: There is no thoracic aortic aneurysm or dissection. There are foci of atherosclerotic calcification in the visualized proximal great vessels without hemodynamically significant obstruction in these areas. There is aortic atherosclerosis as well as multiple foci of coronary artery calcification. There is no pericardial effusion or pericardial thickening evident. Patient is status post mitral valve replacement. There is prominence of the main pulmonary outflow tract measuring  3.2 cm in diameter. Mediastinum/Nodes: Thyroid appears unremarkable. There are scattered subcentimeter lymph nodes throughout the mediastinum and hilar regions. There is a subcarinal lymph node measuring 1.5 x 1.5 cm, essentially stable. No new adenopathy is appreciable. No esophageal lesions are evident. Lungs/Pleura: There is underlying centrilobular and paraseptal emphysematous change. There has been an increase in consolidation in the left lower lobe, lateral segment. The confluent somewhat irregular nodular opacity in this area measures approximately 5.2 x 3.0 x 2.5 cm. Nodularity along the periphery of this area is also noted, increased from recent study. A previously noted nodular opacity in the anterior segment of the right upper lobe seen on axial slice 32 series 3 measures 4 mm, unchanged. No pleural effusion or pleural thickening evident. There is scarring in the lung bases, stable. Upper Abdomen: There is atherosclerotic calcification in the visualized upper abdominal aorta. Visualized upper abdominal structures otherwise appear unremarkable. Musculoskeletal: Patient is status post median sternotomy. No evident blastic or lytic bone lesions. No chest wall lesions are evident. IMPRESSION: 1. The previously noted area of consolidation in the left lower  lobe appears larger and somewhat more irregular with peripheral nodularity, new from prior study. While this area may represent progression of pneumonia, concern for underlying neoplasm is heightened given the progression since prior study. Given this concern for potential neoplasm, it may be prudent to consider bronchoscopy with bronchial washings to further evaluate. It also may be prudent to consider nuclear medicine PET study to determine area of maximal metabolic activity to assist in targeting optimal tissue sampling site should biopsy become warranted. If this area of consolidation/irregular opacity should prove to represent pneumonia, continued surveillance clearing is felt to be advisable, as a neoplasm easily could be obscured in this circumstance. 2. Underlying centrilobular and paraseptal emphysematous change. Scattered areas of scarring noted. Stable 4 mm nodular opacity right upper lobe. 3. Stable sub-carinal lymph node prominence. Scattered subcentimeter lymph nodes elsewhere in the chest remains stable. 4. Aortic atherosclerosis. Foci of great vessel calcification and coronary artery calcification noted. Status post mitral valve replacement. 5. Prominence the main pulmonary outflow tract, a finding concerning for underlying pulmonary arterial hypertension. Aortic Atherosclerosis (ICD10-I70.0) and Emphysema (ICD10-J43.9). These results will be called to the ordering clinician or representative by the Radiologist Assistant, and communication documented in the PACS or zVision Dashboard. Electronically Signed   By: Lowella Grip III M.D.   On: 04/24/2018 14:01       Assessment & Plan:   No problem-specific Assessment & Plan notes found for this encounter.     Lauraine Rinne, NP 05/14/2018

## 2018-05-15 ENCOUNTER — Ambulatory Visit (INDEPENDENT_AMBULATORY_CARE_PROVIDER_SITE_OTHER): Payer: Medicare Other | Admitting: Nurse Practitioner

## 2018-05-15 ENCOUNTER — Ambulatory Visit (INDEPENDENT_AMBULATORY_CARE_PROVIDER_SITE_OTHER): Payer: Medicare Other | Admitting: *Deleted

## 2018-05-15 ENCOUNTER — Ambulatory Visit: Payer: Medicare Other | Admitting: Nurse Practitioner

## 2018-05-15 ENCOUNTER — Encounter: Payer: Self-pay | Admitting: Nurse Practitioner

## 2018-05-15 DIAGNOSIS — J439 Emphysema, unspecified: Secondary | ICD-10-CM | POA: Diagnosis not present

## 2018-05-15 DIAGNOSIS — Z5181 Encounter for therapeutic drug level monitoring: Secondary | ICD-10-CM

## 2018-05-15 DIAGNOSIS — I4891 Unspecified atrial fibrillation: Secondary | ICD-10-CM

## 2018-05-15 DIAGNOSIS — R918 Other nonspecific abnormal finding of lung field: Secondary | ICD-10-CM | POA: Diagnosis not present

## 2018-05-15 DIAGNOSIS — J9611 Chronic respiratory failure with hypoxia: Secondary | ICD-10-CM

## 2018-05-15 LAB — POCT INR: INR: 1.9 — AB (ref 2.0–3.0)

## 2018-05-15 MED ORDER — PREDNISONE 20 MG PO TABS: 20.0000 mg | ORAL_TABLET | Freq: Every day | ORAL | 0 refills | Status: DC

## 2018-05-15 NOTE — Assessment & Plan Note (Addendum)
Patient Instructions Severe COPD  Continue current medications Please keep follow up with Dr. Servando Snare Will give 2 week supply of prednisone until it can be refilled through [facility] Follow up with Dr. Lamonte Sakai first available appointment Continue O2 at 3L Keep schedule PET scan tomorrow

## 2018-05-15 NOTE — Assessment & Plan Note (Signed)
Continue O2 at 3L

## 2018-05-15 NOTE — Patient Instructions (Addendum)
Continue current medications Please keep follow up with Dr. Servando Snare Will give 2 week supply of prednisone until it can be refilled through [facility] Follow up with Dr. Lamonte Sakai first available appointment

## 2018-05-15 NOTE — Assessment & Plan Note (Signed)
Discussion: Discussed the possibility that the area of concern on CT scan could most likely be malignant. Discussed patient's current condition and that most likely surgery would not be an option. Discussed the side effects of chemo or radiation related to his current condition. Advised patient to take this into consideration before follow up with Dr. Servando Snare. Patient states that Dr. Servando Snare had also had this conversation with him and he understands. We will follow up closely after PET scan.

## 2018-05-15 NOTE — Patient Instructions (Signed)
Description   Today take an extra 1/2 tablet, then continue 1 tablet daily except 1/2 tablet on Mondays, Wednesdays, and Fridays. Remains on Prednisone 20 mg daily as maintenance dose. Recheck INR 10 days.  Call with new medications or any changes [phone].

## 2018-05-15 NOTE — Progress Notes (Signed)
@Patient  ID: [name], male    DOB: [birth year], 82 y.o.   MRN: [id number]  Chief Complaint  Patient presents with  . Follow-up    Emphysema    Referring provider: Tammi Sou, MD  HPI   82 year old male former smoker with COPD, associated hypoxemic respiratory failure followed by Dr. Lamonte Sakai.  History of mitral valve repair, a-fib, CAD, history of stroke, secondary pulmonary hypertension, prostate cancer.   Tests:  CT chest 03-04-18:  Irregular and somewhat nodular/spiculated foci of airspace disease in the left lower lobe, possibly due to pneumonia, however lung neoplasm is also a concern. Short interval CT follow-up in 4-6 weeks following antibiotic trial is suggested. Moderate emphysema  CT chest 04-24-18: The previously noted area of consolidation in the left lower lobe appears larger and somewhat more irregular with peripheral nodularity, new from prior study. While this area may represent progression of pneumonia, concern for underlying neoplasm is heightened given the progression since prior study.  ROV 05-15-18 Follow up COPD/abnormal CT scan Patient presents today for a follow up. He was admitted to the hospital in June 2019 for respiratory distress and increased shortness of breath. During this hospitalization a CT of the chest was performed with questionable PNA/mass. Patient was treated for pneumonia, but on follow up CT the mass had increased in size and looked concerning for malignancy. Patient was referred to CT surgery group. Patient has PET scan scheduled tomorrow and follow up scheduled with Dr. Servando Snare on Thursday (8-.29-19).   Patient states that he is feeling weaker since last visit. States that he is now in a wheelchair because he is too weak and short of breath to ambulate even short distances. He is in no respiratory distress while in the office today. He is on chronic prednisone at 20 mg daily. Stiolto daily. Albuterol 4-5 x daily. O2 at 3L. He is  compliant with medications. Denies any fever. Patient requests a 2 week supply of prednisone until he can get [facility] refill [facility].    Recent Significant Encounters:   OV 05-08-18 Surgical Consult:  Assessment / Plan:   Increasing complex nodular density left lower lobe, possible inflammatory versus malignant-we will obtain a PET scan and then further evaluation for biopsy strategies.  The patient is not a candidate for any surgical procedures-we will plan to see after the PET scan is performed and then review interventional radiology concerning CT-guided biopsy Severe COPD steroid and O2 dependent   No Known Allergies  Immunization History  Administered Date(s) Administered  . Influenza Whole 06/05/2008, 09/28/2008, 07/31/2012  . Influenza, High Dose Seasonal PF 06/27/2013, 06/12/2014, 06/05/2015, 06/13/2016, 07/03/2017  . Pneumococcal Conjugate-13 12/03/2014  . Pneumococcal-Unspecified 09/19/2008  . Tdap 06/05/2015    Past Medical History:  Diagnosis Date  . Adrenal adenoma   . Atrial fibrillation (HCC)    Rate control + Coumadin  . Atrial flutter ([place])   . CAD (coronary artery disease)    Lexiscan Myoview (01/2014): Lateral soft tissue attenuation, no ischemia, not gated, low risk  . Cancer ([place])   . CAP (community acquired pneumonia)    Hospitalized 10/2014  . Chronic diastolic heart failure ([place])   . Chronic hypoxemic respiratory failure (HCC)    from COPD  . Chronic renal insufficiency, stage III (moderate) (HCC)    CrCl 50s  . COPD (chronic obstructive pulmonary disease) (HCC)    oxygen-dependent (2.5 L 24/7).  Improved on starting chronic prednisone 06/2017 (increased to 20 mg  qd 08/2017)  Progressive worsening dyspnea at pulm f/u 10/19/17--? changed to continuous 02 from pulsed delivery (?)--no change in med regimen.  . Diabetes mellitus without complication ([place]) 98/33/8250   A1c 6.5 % 05/2016  . Diverticulosis   . DOE (dyspnea on exertion)     Multifactorial: deconditioning, COPD, obesity hypoventilation syndrome.  Marland Kitchen Dyslipidemia   . Embolic stroke ([place])    d/t endocarditis 2008  . Hemorrhoids   . History of mitral valve replacement    needs SBE prophylaxis  . History of prostate cancer remote past   f/u by Dr. Claris Che; no recurrence as of f/u 10/2015.  PSA rising from 2013 to 04/2018 (PSA 04/25/18=18.49): urol to rpt PSA and check CT and bone scan  approx 10/2018.  Marland Kitchen HTN (hypertension)   . Left lower lobe pulmonary nodule July/Aug 2019.   04/2018: enlarging irregular nodular opacity--referred to CT surgery 04/25/18.  . Mediastinal lymphadenopathy   . Pulmonary hypertension ([place]) 01/09/2015   secondary  . Right thyroid nodule   . Thrombocytopenia ([place])   . TIA (transient ischemic attack) 03/25/2011   ? of due to transient diplopia  . Ulcerative colitis    left-sided/segmental, associated with diverticulosis.  Sulfasalazine per GI (Dr. Carlean Purl).  . Warthin's tumor     Tobacco History: Social History   Tobacco Use  Smoking Status Former Smoker  . Packs/day: 3.00  . Years: 60.00  . Pack years: 180.00  . Types: Cigarettes  . Last attempt to quit: 09/19/2002  . Years since quitting: 15.6  Smokeless Tobacco Never Used   Counseling given: Yes   Outpatient Encounter Medications as of 05/15/2018  Medication Sig  . albuterol (PROVENTIL HFA;VENTOLIN HFA) 108 (90 Base) MCG/ACT inhaler INHALE ONE PUFF BY MOUTH EVERY 6 HOURS AS NEEDED FOR WHEEZING AND FOR SHORTNESS OF BREATH (USING 5-6 TIMES EVERY 24 HOURS)  . albuterol (PROVENTIL) (2.5 MG/3ML) 0.083% nebulizer solution Take 3 mLs (2.5 mg total) by nebulization every 4 (four) hours. And as needed  . budesonide (PULMICORT) 0.5 MG/2ML nebulizer solution USE ONE VIAL IN NEBULIZER TWICE DAILY  . digoxin (LANOXIN) 0.125 MG tablet Take 1 tablet (0.125 mg total) by mouth daily.  Marland Kitchen diltiazem (CARDIZEM CD) 240 MG 24 hr capsule Take 1 capsule (240 mg total) by mouth daily.  Marland Kitchen glipiZIDE  (GLUCOTROL XL) 5 MG 24 hr tablet Take 1 tablet (5 mg total) by mouth daily with breakfast.  . LORazepam (ATIVAN) 0.5 MG tablet Take 1 tablet by mouth daily as needed.  . pravastatin (PRAVACHOL) 40 MG tablet TAKE 1 TABLET EVERY EVENING (Patient taking differently: Take 40 mg by mouth daily. TAKE 1 TABLET EVERY EVENING)  . predniSONE (DELTASONE) 20 MG tablet Take 20 mg by mouth daily with breakfast.  . STIOLTO RESPIMAT 2.5-2.5 MCG/ACT AERS INHALE 2 PUFFS INTO THE LUNGS DAILY.  Marland Kitchen warfarin (COUMADIN) 5 MG tablet Take 0.5-1 tablets (2.5-5 mg total) by mouth See admin instructions. 5mg  daily except Friday take 2.5mg  AS DIRECTED BY COUMADIN CLINIC (Patient taking differently: Take 2.5-5 mg by mouth daily. )  . furosemide (LASIX) 40 MG tablet Take 1 tablet (40 mg total) by mouth daily.   No facility-administered encounter medications on file as of 05/15/2018.      Review of Systems  Review of Systems  Constitutional: Positive for activity change. Negative for fever.  HENT: Negative.   Respiratory: Positive for shortness of breath. Negative for cough and wheezing.   Cardiovascular: Negative.   Gastrointestinal: Negative.   Allergic/Immunologic: Negative.  Neurological: Negative.   Psychiatric/Behavioral: Negative.        Physical Exam  BP 122/64 (BP Location: Right Arm, Patient Position: Sitting, Cuff Size: Normal)   Pulse 91   Ht 5\' 7"  (1.702 m)   Wt 186 lb 9.6 oz (84.6 kg)   SpO2 93% Comment: On pulse air setting of 3.  BMI 29.23 kg/m   Wt Readings from Last 5 Encounters:  05/15/18 186 lb 9.6 oz (84.6 kg)  05/08/18 190 lb (86.2 kg)  04/24/18 188 lb 8 oz (85.5 kg)  04/17/18 190 lb 6 oz (86.4 kg)  03/15/18 185 lb 2 oz (84 kg)     Physical Exam  Constitutional: He is oriented to person, place, and time. No distress.  Wheelchair bound due to generalized weakness.   Cardiovascular: Normal rate and regular rhythm.  Pulmonary/Chest: Effort normal and breath sounds normal.    Neurological: He is alert and oriented to person, place, and time.  Skin: Skin is warm and dry.  Psychiatric: He has a normal mood and affect.  Nursing note and vitals reviewed.     Assessment & Plan:   COPD (chronic obstructive pulmonary disease) with emphysema (HCC) Patient Instructions Severe COPD  Continue current medications Please keep follow up with Dr. Servando Snare Will give 2 week supply of prednisone until it can be refilled through [facility] Follow up with Dr. Lamonte Sakai first available appointment Continue O2 at 3L Keep schedule PET scan tomorrow  Chronic hypoxemic respiratory failure ([place]) Continue O2 at 3L  Mass of lower lobe of left lung Discussion: Discussed the possibility that the area of concern on CT scan could most likely be malignant. Discussed patient's current condition and that most likely surgery would not be an option. Discussed the side effects of chemo or radiation related to his current condition. Advised patient to take this into consideration before follow up with Dr. Servando Snare. Patient states that Dr. Servando Snare had also had this conversation with him and he understands. We will follow up closely after PET scan.      Fenton Foy, NP 05/15/2018

## 2018-05-16 ENCOUNTER — Ambulatory Visit (HOSPITAL_COMMUNITY)
Admission: RE | Admit: 2018-05-16 | Discharge: 2018-05-16 | Disposition: A | Discharge status: Home / Self Care | Payer: Medicare Other | Source: Ambulatory Visit | Attending: Cardiothoracic Surgery | Admitting: Cardiothoracic Surgery

## 2018-05-16 DIAGNOSIS — I700 Atherosclerosis of aorta: Secondary | ICD-10-CM | POA: Insufficient documentation

## 2018-05-16 DIAGNOSIS — R911 Solitary pulmonary nodule: Secondary | ICD-10-CM | POA: Insufficient documentation

## 2018-05-16 DIAGNOSIS — J439 Emphysema, unspecified: Secondary | ICD-10-CM | POA: Insufficient documentation

## 2018-05-16 DIAGNOSIS — I714 Abdominal aortic aneurysm, without rupture: Secondary | ICD-10-CM | POA: Diagnosis not present

## 2018-05-16 DIAGNOSIS — D3502 Benign neoplasm of left adrenal gland: Secondary | ICD-10-CM | POA: Insufficient documentation

## 2018-05-16 LAB — GLUCOSE, CAPILLARY: Glucose-Capillary: 304 mg/dL — ABNORMAL HIGH (ref 70–99)

## 2018-05-16 MED ORDER — FLUDEOXYGLUCOSE F - 18 (FDG) INJECTION
9.2800 | Freq: Once | INTRAVENOUS | Status: AC
  Administered 2018-05-16: 9.28 via INTRAVENOUS

## 2018-05-17 ENCOUNTER — Other Ambulatory Visit: Payer: Self-pay

## 2018-05-17 ENCOUNTER — Ambulatory Visit (INDEPENDENT_AMBULATORY_CARE_PROVIDER_SITE_OTHER): Payer: Medicare Other | Admitting: Cardiothoracic Surgery

## 2018-05-17 ENCOUNTER — Encounter: Payer: Self-pay | Admitting: Cardiothoracic Surgery

## 2018-05-17 VITALS — BP 139/67 | HR 80 | Resp 18 | Ht 67.0 in | Wt 186.0 lb

## 2018-05-17 DIAGNOSIS — I2510 Atherosclerotic heart disease of native coronary artery without angina pectoris: Secondary | ICD-10-CM

## 2018-05-17 DIAGNOSIS — R918 Other nonspecific abnormal finding of lung field: Secondary | ICD-10-CM | POA: Diagnosis not present

## 2018-05-17 NOTE — Progress Notes (Signed)
[address]       [place] [address]             [phone]                    Malek L Wyly [place] Medical Record #[id number] Date of Birth: 10-Dec-1935  Referring: Tammi Sou, MD Primary Care: Tammi Sou, MD Primary Cardiologist: No primary care provider on file. Followed By  [facility]  Pulmonary  Chief Complaint:    Chief Complaint  Patient presents with  . Lung Lesion    f/u with PET to discuss options    History of Present Illness:    [name] 82 y.o. male is seen in the office  today for abnormal CT of the chest.  Patient has a past medical history significant for COPD steroid and O2 dependent baseline, known coronary artery disease history of congestive heart failure, previous mitral valve repair with maze, chronic anticoagulation with Coumadin for atrial fibrillation, and diabetes.  Patient had mitral valve repair in 2004 and then subsequent reoperation 8 months later, during this.  He also had episode of fungal endocarditis   In June the patient was admitted at [facility] because of increasing respiratory distress, increasing shortness of breath and wheezing.  During the hospitalization a CT of the chest was performed suggesting left lower lobe mass.  Follow-up CT scan of the chest was done recently and the patient referred to surgery.  Patient returns today with a PET scan to evaluate the possibility of left lower lobe lung malignancy and for further biopsy and staging purposes.  Patient continues to be very limited in his activity, is on home oxygen, steroids for COPD.  He has had a course of antibiotics previously but is not currently on antibiotic therapy.  He does note yellow sputum production, denies any hemoptysis.  Current Activity/ Functional Status:  Patient is independent with mobility/ambulation, transfers, ADL's, IADL's.   Zubrod Score: At the time of surgery this patient's most appropriate activity status/level  should be described as: []     0    Normal activity, no symptoms []     1    Restricted in physical strenuous activity but ambulatory, able to do out light work []     2    Ambulatory and capable of self care, unable to do work activities, up and about               >50 % of waking hours                              [x]     3    Only limited self care, in bed greater than 50% of waking hours []     4    Completely disabled, no self care, confined to bed or chair []     5    Moribund   Past Medical History:  Diagnosis Date  . Adrenal adenoma   . Atrial fibrillation (HCC)    Rate control + Coumadin  . Atrial flutter ([place])   . CAD (coronary artery disease)    Lexiscan Myoview (01/2014): Lateral soft tissue attenuation, no ischemia, not gated, low risk  . Cancer ([place])   . CAP (community acquired pneumonia)    Hospitalized 10/2014  . Chronic diastolic heart failure ([place])   . Chronic hypoxemic respiratory failure (HCC)    from COPD  .  Chronic renal insufficiency, stage III (moderate) (HCC)    CrCl 50s  . COPD (chronic obstructive pulmonary disease) (HCC)    oxygen-dependent (2.5 L 24/7).  Improved on starting chronic prednisone 06/2017 (increased to 20 mg qd 08/2017)  Progressive worsening dyspnea at pulm f/u 10/19/17--? changed to continuous 02 from pulsed delivery (?)--no change in med regimen.  . Diabetes mellitus without complication ([place]) 01/75/1025   A1c 6.5 % 05/2016  . Diverticulosis   . DOE (dyspnea on exertion)    Multifactorial: deconditioning, COPD, obesity hypoventilation syndrome.  Marland Kitchen Dyslipidemia   . Embolic stroke ([place])    d/t endocarditis 2008  . Hemorrhoids   . History of mitral valve replacement    needs SBE prophylaxis  . History of prostate cancer remote past   f/u by Dr. Claris Che; no recurrence as of f/u 10/2015.  PSA rising from 2013 to 04/2018 (PSA 04/25/18=18.49): urol to rpt PSA and check CT and bone scan  approx 10/2018.  Marland Kitchen HTN (hypertension)   . Left lower lobe  pulmonary nodule July/Aug 2019.   04/2018: enlarging irregular nodular opacity--referred to CT surgery 04/25/18.  . Mediastinal lymphadenopathy   . Pulmonary hypertension ([place]) 01/09/2015   secondary  . Right thyroid nodule   . Thrombocytopenia ([place])   . TIA (transient ischemic attack) 03/25/2011   ? of due to transient diplopia  . Ulcerative colitis    left-sided/segmental, associated with diverticulosis.  Sulfasalazine per GI (Dr. Carlean Purl).  . Warthin's tumor     Past Surgical History:  Procedure Laterality Date  . carotid duplex dopplers  04/2014   No signif plaques; repeat 2 yrs per cardiology  . CATARACT EXTRACTION, BILATERAL    . COLONOSCOPY W/ BIOPSIES  12/19/2007   left-sided colitis, diverticulosis, hemorrhoids  . FLEXIBLE SIGMOIDOSCOPY  01/11/2008; 03/19/2010   2009 and 2011:segmental left colitis, diverticulosis, hemorrhoids  . INGUINAL HERNIA REPAIR     right  . MITRAL VALVE REPAIR  09/22/04   and modified Cox-Maze IV   . MITRAL VALVE REPAIR  09/14/05   redo MV repair d/t endocardiits/embolic events  . PROSTATECTOMY  06/2003   Radical  . RIGHT HEART CATHETERIZATION N/A 01/01/2015   Procedure: RIGHT HEART CATH;  Surgeon: Peter M Martinique, MD;  Location: [facility] CATH LAB;  Service: Cardiovascular;  Laterality: N/A;  . SALIVARY GLAND SURGERY     left resection  . TRANSTHORACIC ECHOCARDIOGRAM  11/2013; 07/2017   2015: Mod LVH, EF 55-60%, no WM abnormalities, +LA dilation, +severe RV dilation and impaired systolic fxn, +increased pulm art pressures.  Valves ok.  2018- EF 60-65%, wall motion nl, s/p MV repair, mild RV syst dysf.     Family History  Problem Relation Age of Onset  . Cancer Mother        spinal  . Stroke Father   . Heart disease Father   . Colon cancer Neg Hx   . Heart attack Neg Hx      Social History   Tobacco Use  Smoking Status Former Smoker  . Packs/day: 3.00  . Years: 60.00  . Pack years: 180.00  . Types: Cigarettes  . Last attempt to quit: 09/19/2002  .  Years since quitting: 15.6  Smokeless Tobacco Never Used    Social History   Substance and Sexual Activity  Alcohol Use No  . Alcohol/week: 0.0 standard drinks     No Known Allergies  Current Outpatient Medications  Medication Sig Dispense Refill  . albuterol (PROVENTIL HFA;VENTOLIN HFA) 108 (90 Base) MCG/ACT  inhaler INHALE ONE PUFF BY MOUTH EVERY 6 HOURS AS NEEDED FOR WHEEZING AND FOR SHORTNESS OF BREATH (USING 5-6 TIMES EVERY 24 HOURS) 1 Inhaler 5  . albuterol (PROVENTIL) (2.5 MG/3ML) 0.083% nebulizer solution Take 3 mLs (2.5 mg total) by nebulization every 4 (four) hours. And as needed 300 mL 5  . budesonide (PULMICORT) 0.5 MG/2ML nebulizer solution USE ONE VIAL IN NEBULIZER TWICE DAILY 120 mL 5  . digoxin (LANOXIN) 0.125 MG tablet Take 1 tablet (0.125 mg total) by mouth daily. 90 tablet 3  . diltiazem (CARDIZEM CD) 240 MG 24 hr capsule Take 1 capsule (240 mg total) by mouth daily. 90 capsule 3  . glipiZIDE (GLUCOTROL XL) 5 MG 24 hr tablet Take 1 tablet (5 mg total) by mouth daily with breakfast. 90 tablet 1  . LORazepam (ATIVAN) 0.5 MG tablet Take 1 tablet by mouth daily as needed.    . pravastatin (PRAVACHOL) 40 MG tablet TAKE 1 TABLET EVERY EVENING (Patient taking differently: Take 40 mg by mouth daily. TAKE 1 TABLET EVERY EVENING) 90 tablet 3  . predniSONE (DELTASONE) 20 MG tablet Take 1 tablet (20 mg total) by mouth daily with breakfast for 14 days. 14 tablet 0  . STIOLTO RESPIMAT 2.5-2.5 MCG/ACT AERS INHALE 2 PUFFS INTO THE LUNGS DAILY. 12 g 3  . warfarin (COUMADIN) 5 MG tablet Take 0.5-1 tablets (2.5-5 mg total) by mouth See admin instructions. 5mg  daily except Friday take 2.5mg  AS DIRECTED BY COUMADIN CLINIC (Patient taking differently: Take 2.5-5 mg by mouth daily. ) 90 tablet 1  . furosemide (LASIX) 40 MG tablet Take 1 tablet (40 mg total) by mouth daily. 30 tablet 0   No current facility-administered medications for this visit.     Pertinent items are noted in HPI.    Review of Systems:     Cardiac Review of Systems: [Y] = yes  or   [ N ] = no   Chest Pain [   N]  Resting SOB [Y ] Exertional SOB  [Y  ]  Vertell Limber Aqua.Slicker ]   Pedal Edema Jazmín.Cullens   ]    Palpitations Jazmín.Cullens ] Syncope  Aqua.Slicker ]   Presyncope Aqua.Slicker  ]   General Review of Systems: [Y] = yes [  ]=no Constitional: recent weight change [  ];  Wt loss over the last 3 months [   ] anorexia [  ]; fatigue [Y ]; nausea [  ]; night sweats [  ]; fever [  ]; or chills [  ];           Eye : blurred vision [  ]; diplopia [   ]; vision changes [  ];  Amaurosis fugax[  ]; Resp: cough [ Y ];  wheezing[ y ];  hemoptysis[  ]; shortness of breath[  Y]; paroxysmal nocturnal dyspnea[Y  ]; dyspnea on exertion[Y ]; or orthopnea[ Y ];  GI:  gallstones[  ], vomiting[  ];  dysphagia[  ]; melena[  ];  hematochezia [  ]; heartburn[  ];   Hx of  Colonoscopy[  ]; GU: kidney stones [  ]; hematuria[  ];   dysuria [  ];  nocturia[  ];  history of     obstruction [  ]; urinary frequency [  ]             Skin: rash, swelling[  ];, hair loss[  ];  peripheral edema[  ];  or itching[  ]; Musculosketetal: myalgias[  ];  joint  swelling[  ];  joint erythema[  ];  joint pain[  ];  back pain[  ];  Heme/Lymph: bruising[  ];  bleeding[  ];  anemia[  ];  Neuro: TIA[  ];  headaches[  ];  stroke[  ];  vertigo[  ];  seizures[  ];   paresthesias[  ];  difficulty walking[Y  ];  Psych:depression[  ]; anxiety[  ];  Endocrine: diabetes[ y ];  thyroid dysfunction[  ];  Immunizations: Flu up to date [ Y ]; Pneumococcal up to date [ Y ];  Other:     PHYSICAL EXAMINATION: BP 139/67 (BP Location: Left Arm, Patient Position: Sitting, Cuff Size: Normal)   Pulse 80   Resp 18   Ht 5\' 7"  (1.702 m)   Wt 186 lb (84.4 kg)   SpO2 (!) 88% Comment: 3L [place]  BMI 29.13 kg/m  General appearance: alert, cooperative and appears older than stated age Head: Normocephalic, without obvious abnormality, atraumatic Neck: no adenopathy, no carotid bruit, no JVD, supple, symmetrical,  trachea midline and thyroid not enlarged, symmetric, no tenderness/mass/nodules Resp: diminished breath sounds bibasilar Cardio: Irregular irregular rate, S1, S2 normal, no murmur, click, rub or gallop GI: soft, non-tender; bowel sounds normal; no masses,  no organomegaly Extremities: extremities normal, atraumatic, no cyanosis or edema Neurologic: Grossly normal On 3 L patient's O2 sats are 88%  Diagnostic Studies & Laboratory data:     Recent Radiology Findings: Nm Pet Image Restag (ps) Skull Base To Thigh  Result Date: 05/17/2018 CLINICAL DATA:  Initial treatment strategy for lung nodule. EXAM: NUCLEAR MEDICINE PET SKULL BASE TO THIGH TECHNIQUE: 9.28 mCi F-18 FDG was injected intravenously. Full-ring PET imaging was performed from the skull base to thigh after the radiotracer. CT data was obtained and used for attenuation correction and anatomic localization. Fasting blood glucose: 305 mg/dl COMPARISON:  CT chest 04/24/2018. FINDINGS: Mediastinal blood pool activity: SUV max 2.66 NECK: There is a small right level-II cervical lymph node posterior to the right parotid gland measuring 8 mm within SUV max of 3.48. Incidental CT findings: none CHEST: There is no hypermetabolic mediastinal or hilar adenopathy. The peripheral area of masslike architectural distortion/airspace consolidation within the posterior left lower lobe is again noted. On today's study this area measures 5.2 x 1.8 cm, image 37/8. Previously 5.2 x 3.0 cm. Mild FDG uptake within this area has an SUV max of 2.2. Tiny nodule within the right upper lobe measures 4 mm and appears unchanged; Too small to characterize by PET-CT. Incidental CT findings: Mild to moderate change of emphysema. The heart size is enlarged. Previous CABG. Aortic atherosclerosis. ABDOMEN/PELVIS: No abnormal hypermetabolic activity within the liver, pancreas, adrenal glands, or spleen. No hypermetabolic lymph nodes in the abdomen or pelvis. Incidental CT findings:  Nodule in left adrenal gland measures 1.9 cm and -8.79 HU compatible with a benign adenoma. Normal right adrenal gland. Aortic atherosclerosis. Infrarenal abdominal aortic aneurysm measures 3.4 cm, image 123/4. SKELETON: No focal hypermetabolic activity to suggest skeletal metastasis. Incidental CT findings: There is a irregular sclerotic focus within the left iliac bone measuring 1.6 cm without significant FDG uptake. IMPRESSION: 1. The masslike area of consolidation and architectural distortion within the posterior left lower lobe appears slightly decreased in size from 04/24/2018 and exhibits mild FDG uptake. Findings are favored to represent sequelae of inflammatory or infectious process. Follow-up imaging in 3 months with repeat CT of the chest is advised to assess interval change. If this does not resolve then consider further  evaluation with tissue sampling. 2. Stable 4 mm right upper lobe lung nodule which is too small to reliably characterize by PET-CT. Non-contrast chest CT can be considered in 12 months if patient is high-risk. This recommendation follows the consensus statement: Guidelines for Management of Incidental Pulmonary Nodules Detected on CT Images: From the Fleischner Society 2017; Radiology 2017; 284:228-243. 3. Left adrenal adenoma. 4. Aortic Atherosclerosis (ICD10-I70.0) and Emphysema (ICD10-J43.9). 5. Infrarenal abdominal aortic aneurysm measures 3.4 cm. Recommend followup by ultrasound in 3 years. This recommendation follows ACR consensus guidelines: White Paper of the ACR Incidental Findings Committee II on Vascular Findings. J Am Coll Radiol 2013; 32:671-245 6. These results will be called to the ordering clinician or representative by the Radiologist Assistant, and communication documented in the PACS or zVision Dashboard. Electronically Signed   By: Kerby Moors M.D.   On: 05/17/2018 09:44    Ct Chest W Contrast  Result Date: 04/24/2018 CLINICAL DATA:  Abnormal opacity on recent  chest CT EXAM: CT CHEST WITH CONTRAST TECHNIQUE: Multidetector CT imaging of the chest was performed during intravenous contrast administration. CONTRAST:  33mL ISOVUE-300 IOPAMIDOL (ISOVUE-300) INJECTION 61% COMPARISON:  Chest CT March 04, 2018 FINDINGS: Cardiovascular: There is no thoracic aortic aneurysm or dissection. There are foci of atherosclerotic calcification in the visualized proximal great vessels without hemodynamically significant obstruction in these areas. There is aortic atherosclerosis as well as multiple foci of coronary artery calcification. There is no pericardial effusion or pericardial thickening evident. Patient is status post mitral valve replacement. There is prominence of the main pulmonary outflow tract measuring 3.2 cm in diameter. Mediastinum/Nodes: Thyroid appears unremarkable. There are scattered subcentimeter lymph nodes throughout the mediastinum and hilar regions. There is a subcarinal lymph node measuring 1.5 x 1.5 cm, essentially stable. No new adenopathy is appreciable. No esophageal lesions are evident. Lungs/Pleura: There is underlying centrilobular and paraseptal emphysematous change. There has been an increase in consolidation in the left lower lobe, lateral segment. The confluent somewhat irregular nodular opacity in this area measures approximately 5.2 x 3.0 x 2.5 cm. Nodularity along the periphery of this area is also noted, increased from recent study. A previously noted nodular opacity in the anterior segment of the right upper lobe seen on axial slice 32 series 3 measures 4 mm, unchanged. No pleural effusion or pleural thickening evident. There is scarring in the lung bases, stable. Upper Abdomen: There is atherosclerotic calcification in the visualized upper abdominal aorta. Visualized upper abdominal structures otherwise appear unremarkable. Musculoskeletal: Patient is status post median sternotomy. No evident blastic or lytic bone lesions. No chest wall lesions are  evident. IMPRESSION: 1. The previously noted area of consolidation in the left lower lobe appears larger and somewhat more irregular with peripheral nodularity, new from prior study. While this area may represent progression of pneumonia, concern for underlying neoplasm is heightened given the progression since prior study. Given this concern for potential neoplasm, it may be prudent to consider bronchoscopy with bronchial washings to further evaluate. It also may be prudent to consider nuclear medicine PET study to determine area of maximal metabolic activity to assist in targeting optimal tissue sampling site should biopsy become warranted. If this area of consolidation/irregular opacity should prove to represent pneumonia, continued surveillance clearing is felt to be advisable, as a neoplasm easily could be obscured in this circumstance. 2. Underlying centrilobular and paraseptal emphysematous change. Scattered areas of scarring noted. Stable 4 mm nodular opacity right upper lobe. 3. Stable sub-carinal lymph node prominence. Scattered subcentimeter  lymph nodes elsewhere in the chest remains stable. 4. Aortic atherosclerosis. Foci of great vessel calcification and coronary artery calcification noted. Status post mitral valve replacement. 5. Prominence the main pulmonary outflow tract, a finding concerning for underlying pulmonary arterial hypertension. Aortic Atherosclerosis (ICD10-I70.0) and Emphysema (ICD10-J43.9). These results will be called to the ordering clinician or representative by the Radiologist Assistant, and communication documented in the PACS or zVision Dashboard. Electronically Signed   By: Lowella Grip III M.D.   On: 04/24/2018 14:01     I have independently reviewed the above radiology studies  and reviewed the findings with the patient.   Recent Lab Findings: Lab Results  Component Value Date   WBC 10.3 03/07/2018   HGB 12.0 (L) 03/07/2018   HCT 37.0 (L) 03/07/2018   PLT 141  (L) 03/07/2018   GLUCOSE 155 (H) 04/24/2018   CHOL 159 10/16/2017   TRIG 224.0 (H) 10/16/2017   HDL 46.50 10/16/2017   LDLDIRECT 88.0 10/16/2017   LDLCALC 79 10/12/2016   ALT 18 03/01/2018   AST 20 03/01/2018   NA 141 04/24/2018   K 3.6 04/24/2018   CL 100 04/24/2018   CREATININE 1.11 04/24/2018   BUN 16 04/24/2018   CO2 31 04/24/2018   TSH 3.24 12/09/2014   INR 1.9 (A) 05/15/2018   HGBA1C 8.7 (H) 04/24/2018   Echo: 03/05/2018          *[facility] [employer].                        [place], [place] [address]                            [phone]  ------------------------------------------------------------------- Transthoracic Echocardiography  Patient:    Jasmeet, Gehl MR #:       [id number] Study Date: 03/05/2018 Gender:     M Age:        4 Height:     172.7 cm Weight:     84.5 kg BSA:        2.03 m^2 Pt. Status: Room:       1429   ADMITTING    Luvenia Heller  REFERRING    Etta Quill  SONOGRAPHER  Merrie Roof, RDCS  PERFORMING   [facility], Inpatient  ATTENDING    Alma Friendly  cc:  ------------------------------------------------------------------- LV EF: 60% -   65%  ------------------------------------------------------------------- Indications:      424.0 Mitral valve disease.  ------------------------------------------------------------------- History:   PMH:  CAP. AKI.  Atrial fibrillation.  Congestive heart failure.  Chronic obstructive pulmonary disease.  Risk factors: Diabetes mellitus.  ------------------------------------------------------------------- Study Conclusions  - Left ventricle: The cavity size was normal. Wall thickness was   increased in a pattern of severe LVH. Systolic function was   normal. The estimated ejection fraction was in the range of 60%   to 65%. - Aortic valve: There was mild  regurgitation. - Mitral valve: Post MV repair with continue elevated gradients   previous echo 07/19/17 mean gradient was 14 mmHg and peak 25   mmHg. ? rhythm rapid atrial  fibrillation rate control may be of   benefit Onlty trivial residual MR post repair. Valve area by   continuity equation (using LVOT flow): 1.09 cm^2. - Left atrium: The atrium was moderately dilated. - Right atrium: The atrium was mildly dilated. - Atrial septum: No defect or patent foramen ovale was identified. - Pulmonary arteries: PA peak pressure: 72 mm Hg (S).  ------------------------------------------------------------------- Labs, prior tests, procedures, and surgery: S/P Mitral valve repair.  ------------------------------------------------------------------- Study data:  Comparison was made to the study of 07/19/2017.  Study status:  Routine.  Procedure:  The patient reported no pain pre or post test. Transthoracic echocardiography. Image quality was adequate.  Study completion:  There were no complications. Transthoracic echocardiography.  M-mode, complete 2D, spectral Doppler, and color Doppler.  Birthdate:  Patient birthdate: 1936-03-14.  Age:  Patient is 82 yr old.  Sex:  Gender: male. BMI: 28.3 kg/m^2.  Blood pressure:     138/76  Patient status: Inpatient.  Study date:  Study date: 03/05/2018. Study time: 09:58 AM.  Location:  Bedside.  -------------------------------------------------------------------  ------------------------------------------------------------------- Left ventricle:  The cavity size was normal. Wall thickness was increased in a pattern of severe LVH. Systolic function was normal. The estimated ejection fraction was in the range of 60% to 65%.  ------------------------------------------------------------------- Aortic valve:   Trileaflet; moderately thickened, moderately calcified leaflets.  Doppler:  There was mild regurgitation.     ------------------------------------------------------------------- Mitral valve:  Post MV repair with continue elevated gradients previous echo 07/19/17 mean gradient was 14 mmHg and peak 25 mmHg. ? rhythm rapid atrial fibrillation rate control may be of benefit Onlty trivial residual MR post repair.  Doppler:  There was trivial regurgitation.    Valve area by continuity equation (using LVOT flow): 1.09 cm^2. Indexed valve area by continuity equation (using LVOT flow): 0.54 cm^2/m^2.    Mean gradient (D): 12 mm Hg.  ------------------------------------------------------------------- Left atrium:  The atrium was moderately dilated.  ------------------------------------------------------------------- Atrial septum:  No defect or patent foramen ovale was identified.   ------------------------------------------------------------------- Right ventricle:  The cavity size was normal. Wall thickness was normal. Systolic function was normal.  ------------------------------------------------------------------- Pulmonic valve:    Doppler:  There was mild regurgitation.  ------------------------------------------------------------------- Tricuspid valve:   Doppler:  There was mild regurgitation.  ------------------------------------------------------------------- Right atrium:  The atrium was mildly dilated.  ------------------------------------------------------------------- Pericardium:  The pericardium was normal in appearance.  ------------------------------------------------------------------- Systemic veins: Inferior vena cava: The vessel was normal in size. The respirophasic diameter changes were in the normal range (>= 50%), consistent with normal central venous pressure.  ------------------------------------------------------------------- Measurements   Left ventricle                           Value          Reference  LV ID, ED, PLAX chordal          (L)     41     mm       43 - 52  LV ID, ES, PLAX chordal                  27    mm       23 - 38  LV fx shortening, PLAX chordal           34    %        >=29  LV PW thickness, ED  16    mm       ----------  IVS/LV PW ratio, ED                      1.06           <=1.3  Stroke volume, 2D                        74    ml       ----------  Stroke volume/bsa, 2D                    36    ml/m^2   ----------    Ventricular septum                       Value          Reference  IVS thickness, ED                        17    mm       ----------    LVOT                                     Value          Reference  LVOT ID, S                               24    mm       ----------  LVOT area                                4.52  cm^2     ----------  LVOT peak velocity, S                    95.7  cm/s     ----------  LVOT mean velocity, S                    62.8  cm/s     ----------  LVOT VTI, S                              16.3  cm       ----------  LVOT peak gradient, S                    4     mm Hg    ----------    Aorta                                    Value          Reference  Aortic root ID, ED                       36    mm       ----------    Left atrium  Value          Reference  LA ID, A-P, ES                           54    mm       ----------  LA ID/bsa, A-P                   (H)     2.66  cm/m^2   <=2.2  LA volume, S                             73    ml       ----------  LA volume/bsa, S                         35.9  ml/m^2   ----------  LA volume, ES, 1-p A4C                   91.8  ml       ----------  LA volume/bsa, ES, 1-p A4C               45.2  ml/m^2   ----------  LA volume, ES, 1-p A2C                   56.5  ml       ----------  LA volume/bsa, ES, 1-p A2C               27.8  ml/m^2   ----------    Mitral valve                             Value          Reference  Mitral mean velocity, D                  159   cm/s     ----------  Mitral  mean gradient, D                  12    mm Hg    ----------  Mitral valve area, LVOT                  1.09  cm^2     ----------  continuity  Mitral valve area/bsa, LVOT              0.54  cm^2/m^2 ----------  continuity  Mitral annulus VTI, D                    67.3  cm       ----------    Pulmonary arteries                       Value          Reference  PA pressure, S, DP               (H)     72    mm Hg    <=30    Tricuspid valve                          Value          Reference  Tricuspid regurg peak  velocity           376   cm/s     ----------  Tricuspid peak RV-RA gradient            57    mm Hg    ----------    Right atrium                             Value          Reference  RA ID, S-I, ES, A4C              (H)     59    mm       34 - 49  RA area, ES, A4C                 (H)     27.9  cm^2     8.3 - 19.5  RA volume, ES, A/L                       105   ml       ----------  RA volume/bsa, ES, A/L                   51.7  ml/m^2   ----------    Systemic veins                           Value          Reference  Estimated CVP                            15    mm Hg    ----------    Right ventricle                          Value          Reference  RV ID, minor axis, ED, A4C base          39    mm       ----------  TAPSE                                    11.7  mm       ----------  RV pressure, S, DP               (H)     72    mm Hg    <=30  RV s&', lateral, S                        8.05  cm/s     ----------  Legend: (L)  and  (H)  mark values outside specified reference range.  ------------------------------------------------------------------- Prepared and Electronically Authenticated by  Jenkins Rouge, M.D. 2019-06-17T12:27:11 Assessment / Plan:   complex nodular density left lower lobe, possible inflammatory versus malignant- PET scan results as noted above, the area in question has decreased in size and is very mildly hypermetabolic leading to the suggestion that this  may be inflammatory rather than malignant.   The patient is not a candidate for any surgical procedures-Severe COPD steroid and O2 dependent  Long-term anticoagulation for chronic atrial fibrillation  Post MV  repair with continue elevated gradients with chronic systolic and diastolic heart failure    previous echo 07/19/17 mean gradient was 14 mmHg and peak 25   mmHg.-   I reviewed the PET scan findings with the patient his son and wife in detail, at this point the patient is not interested in a biopsy but is agreeable to a follow-up CT scan in 3 months.  I will forward the results of the PET scan to Dr. Lamonte Sakai who follows him from a pulmonary standpoint and see if the patient should be on empiric antibiotics at this point or just continue follow-up.  He currently has no signs or symptoms of active infection     Grace Isaac MD      [address] [place] [address] Office [phone]   Beeper [phone]  05/17/2018 10:00 AM

## 2018-05-19 ENCOUNTER — Other Ambulatory Visit: Payer: Self-pay

## 2018-05-19 ENCOUNTER — Emergency Department (HOSPITAL_COMMUNITY): Payer: Medicare Other

## 2018-05-19 ENCOUNTER — Emergency Department (HOSPITAL_COMMUNITY)
Admission: EM | Admit: 2018-05-19 | Discharge: 2018-05-19 | Disposition: A | Discharge status: Home / Self Care | Payer: Medicare Other | Attending: Emergency Medicine | Admitting: Emergency Medicine

## 2018-05-19 ENCOUNTER — Encounter (HOSPITAL_COMMUNITY): Payer: Self-pay

## 2018-05-19 DIAGNOSIS — Z7901 Long term (current) use of anticoagulants: Secondary | ICD-10-CM | POA: Diagnosis not present

## 2018-05-19 DIAGNOSIS — Z87891 Personal history of nicotine dependence: Secondary | ICD-10-CM | POA: Insufficient documentation

## 2018-05-19 DIAGNOSIS — Z8673 Personal history of transient ischemic attack (TIA), and cerebral infarction without residual deficits: Secondary | ICD-10-CM | POA: Insufficient documentation

## 2018-05-19 DIAGNOSIS — Z7902 Long term (current) use of antithrombotics/antiplatelets: Secondary | ICD-10-CM | POA: Diagnosis not present

## 2018-05-19 DIAGNOSIS — R05 Cough: Secondary | ICD-10-CM | POA: Insufficient documentation

## 2018-05-19 DIAGNOSIS — I5032 Chronic diastolic (congestive) heart failure: Secondary | ICD-10-CM | POA: Insufficient documentation

## 2018-05-19 DIAGNOSIS — I2510 Atherosclerotic heart disease of native coronary artery without angina pectoris: Secondary | ICD-10-CM | POA: Insufficient documentation

## 2018-05-19 DIAGNOSIS — Z7984 Long term (current) use of oral hypoglycemic drugs: Secondary | ICD-10-CM | POA: Diagnosis not present

## 2018-05-19 DIAGNOSIS — R918 Other nonspecific abnormal finding of lung field: Secondary | ICD-10-CM | POA: Diagnosis not present

## 2018-05-19 DIAGNOSIS — R0689 Other abnormalities of breathing: Secondary | ICD-10-CM | POA: Diagnosis not present

## 2018-05-19 DIAGNOSIS — R000 Tachycardia, unspecified: Secondary | ICD-10-CM | POA: Diagnosis not present

## 2018-05-19 DIAGNOSIS — R531 Weakness: Secondary | ICD-10-CM | POA: Diagnosis not present

## 2018-05-19 DIAGNOSIS — R0602 Shortness of breath: Secondary | ICD-10-CM

## 2018-05-19 DIAGNOSIS — J441 Chronic obstructive pulmonary disease with (acute) exacerbation: Secondary | ICD-10-CM

## 2018-05-19 DIAGNOSIS — Z8546 Personal history of malignant neoplasm of prostate: Secondary | ICD-10-CM | POA: Insufficient documentation

## 2018-05-19 DIAGNOSIS — E1122 Type 2 diabetes mellitus with diabetic chronic kidney disease: Secondary | ICD-10-CM | POA: Diagnosis not present

## 2018-05-19 DIAGNOSIS — I130 Hypertensive heart and chronic kidney disease with heart failure and stage 1 through stage 4 chronic kidney disease, or unspecified chronic kidney disease: Secondary | ICD-10-CM | POA: Diagnosis not present

## 2018-05-19 DIAGNOSIS — N183 Chronic kidney disease, stage 3 (moderate): Secondary | ICD-10-CM | POA: Diagnosis not present

## 2018-05-19 DIAGNOSIS — R0902 Hypoxemia: Secondary | ICD-10-CM | POA: Diagnosis not present

## 2018-05-19 LAB — CBC WITH DIFFERENTIAL/PLATELET
ABS IMMATURE GRANULOCYTES: 0.2 10*3/uL — AB (ref 0.0–0.1)
Basophils Absolute: 0.1 10*3/uL (ref 0.0–0.1)
Basophils Relative: 1 %
EOS PCT: 1 %
Eosinophils Absolute: 0.1 10*3/uL (ref 0.0–0.7)
HCT: 41.3 % (ref 39.0–52.0)
HEMOGLOBIN: 13.2 g/dL (ref 13.0–17.0)
Immature Granulocytes: 3 %
Lymphocytes Relative: 31 %
Lymphs Abs: 3 10*3/uL (ref 0.7–4.0)
MCH: 28.1 pg (ref 26.0–34.0)
MCHC: 32 g/dL (ref 30.0–36.0)
MCV: 87.9 fL (ref 78.0–100.0)
MONO ABS: 0.8 10*3/uL (ref 0.1–1.0)
MONOS PCT: 8 %
NEUTROS ABS: 5.5 10*3/uL (ref 1.7–7.7)
Neutrophils Relative %: 58 %
Platelets: 142 10*3/uL — ABNORMAL LOW (ref 150–400)
RBC: 4.7 MIL/uL (ref 4.22–5.81)
RDW: 15.4 % (ref 11.5–15.5)
WBC: 9.6 10*3/uL (ref 4.0–10.5)

## 2018-05-19 LAB — BRAIN NATRIURETIC PEPTIDE: B Natriuretic Peptide: 113.2 pg/mL — ABNORMAL HIGH (ref 0.0–100.0)

## 2018-05-19 LAB — I-STAT VENOUS BLOOD GAS, ED
ACID-BASE EXCESS: 1 mmol/L (ref 0.0–2.0)
Bicarbonate: 24.8 mmol/L (ref 20.0–28.0)
O2 Saturation: 93 %
PH VEN: 7.467 — AB (ref 7.250–7.430)
TCO2: 26 mmol/L (ref 22–32)
pCO2, Ven: 34.3 mmHg — ABNORMAL LOW (ref 44.0–60.0)
pO2, Ven: 61 mmHg — ABNORMAL HIGH (ref 32.0–45.0)

## 2018-05-19 LAB — I-STAT TROPONIN, ED
TROPONIN I, POC: 0.07 ng/mL (ref 0.00–0.08)
Troponin i, poc: 0.08 ng/mL (ref 0.00–0.08)

## 2018-05-19 LAB — COMPREHENSIVE METABOLIC PANEL
ALBUMIN: 3.4 g/dL — AB (ref 3.5–5.0)
ALT: 18 U/L (ref 0–44)
ANION GAP: 9 (ref 5–15)
AST: 16 U/L (ref 15–41)
Alkaline Phosphatase: 45 U/L (ref 38–126)
BILIRUBIN TOTAL: 1.3 mg/dL — AB (ref 0.3–1.2)
BUN: 11 mg/dL (ref 8–23)
CALCIUM: 8.8 mg/dL — AB (ref 8.9–10.3)
CO2: 27 mmol/L (ref 22–32)
Chloride: 101 mmol/L (ref 98–111)
Creatinine, Ser: 1.24 mg/dL (ref 0.61–1.24)
GFR calc Af Amer: >60 mL/min (ref >60)
GFR calc non Af Amer: 53 mL/min — ABNORMAL LOW (ref >60)
GLUCOSE: 106 mg/dL — AB (ref 70–99)
Potassium: 3.3 mmol/L — ABNORMAL LOW (ref 3.5–5.1)
SODIUM: 137 mmol/L (ref 135–145)
Total Protein: 6.5 g/dL (ref 6.5–8.1)

## 2018-05-19 LAB — PROTIME-INR
INR: 1.77
PROTHROMBIN TIME: 20.4 s — AB (ref 11.4–15.2)

## 2018-05-19 MED ORDER — PREDNISONE 20 MG PO TABS: 60.0000 mg | ORAL_TABLET | Freq: Every day | ORAL | 0 refills | Status: DC

## 2018-05-19 MED ORDER — PREDNISONE 20 MG PO TABS
60.0000 mg | ORAL_TABLET | Freq: Every day | ORAL | Status: DC
  Filled 2018-05-19: qty 3

## 2018-05-19 MED ORDER — IOPAMIDOL (ISOVUE-370) INJECTION 76%
100.0000 mL | Freq: Once | INTRAVENOUS | Status: AC | PRN
  Administered 2018-05-19: 100 mL via INTRAVENOUS

## 2018-05-19 MED ORDER — PREDNISONE 20 MG PO TABS: 60.0000 mg | ORAL_TABLET | Freq: Every day | ORAL | 0 refills | Status: AC

## 2018-05-19 MED ORDER — IOPAMIDOL (ISOVUE-370) INJECTION 76%
INTRAVENOUS | Status: AC
  Filled 2018-05-19: qty 100

## 2018-05-19 MED ORDER — ALBUTEROL (5 MG/ML) CONTINUOUS INHALATION SOLN
10.0000 mg/h | INHALATION_SOLUTION | Freq: Once | RESPIRATORY_TRACT | Status: AC
  Administered 2018-05-19: 10 mg/h via RESPIRATORY_TRACT
  Filled 2018-05-19: qty 20

## 2018-05-19 MED ORDER — PREDNISONE 20 MG PO TABS
60.0000 mg | ORAL_TABLET | Freq: Every day | ORAL | Status: DC
  Administered 2018-05-19: 60 mg via ORAL

## 2018-05-19 MED ORDER — IPRATROPIUM BROMIDE 0.02 % IN SOLN
0.5000 mg | Freq: Once | RESPIRATORY_TRACT | Status: AC
  Administered 2018-05-19: 0.5 mg via RESPIRATORY_TRACT
  Filled 2018-05-19: qty 2.5

## 2018-05-19 NOTE — Discharge Instructions (Addendum)
Continue breathing treatments every 4 hours for the next 24 hours while awake. Then every 4 hours as needed. Increase prednisone to 60 mg daily for the next 4 days. Return to ED if symptoms worsen.

## 2018-05-19 NOTE — ED Notes (Signed)
Patient transported to CT 

## 2018-05-19 NOTE — ED Provider Notes (Signed)
[facility] EMERGENCY DEPARTMENT Provider Note   CSN: [id number] Arrival date & time: 05/19/18  1626     History   Chief Complaint Chief Complaint  Patient presents with  . Shortness of Breath    HPI [name] is a 82 y.o. male.  The history is provided by the patient.  Shortness of Breath  This is a recurrent problem. The problem occurs continuously.The current episode started more than 2 days ago. The problem has been gradually worsening. Associated symptoms include cough and wheezing. Pertinent negatives include no fever, no coryza, no rhinorrhea, no sore throat, no ear pain, no sputum production, no hemoptysis, no PND, no orthopnea, no chest pain, no syncope, no vomiting, no abdominal pain, no rash, no leg pain, no leg swelling and no claudication. It is unknown what precipitated the problem. Risk factors include smoking. He has tried beta-agonist inhalers for the symptoms. The treatment provided mild relief. Associated medical issues include COPD, chronic lung disease and CAD.    Past Medical History:  Diagnosis Date  . Adrenal adenoma   . Atrial fibrillation (HCC)    Rate control + Coumadin  . Atrial flutter ([place])   . CAD (coronary artery disease)    Lexiscan Myoview (01/2014): Lateral soft tissue attenuation, no ischemia, not gated, low risk  . Cancer ([place])   . CAP (community acquired pneumonia)    Hospitalized 10/2014  . Chronic diastolic heart failure ([place])   . Chronic hypoxemic respiratory failure (HCC)    from COPD  . Chronic renal insufficiency, stage III (moderate) (HCC)    CrCl 50s  . COPD (chronic obstructive pulmonary disease) (HCC)    oxygen-dependent (2.5 L 24/7).  Improved on starting chronic prednisone 06/2017 (increased to 20 mg qd 08/2017)  Progressive worsening dyspnea at pulm f/u 10/19/17--? changed to continuous 02 from pulsed delivery (?)--no change in med regimen.  . Diabetes mellitus without complication ([place]) 66/02/3015   A1c  6.5 % 05/2016  . Diverticulosis   . DOE (dyspnea on exertion)    Multifactorial: deconditioning, COPD, obesity hypoventilation syndrome.  Marland Kitchen Dyslipidemia   . Embolic stroke ([place])    d/t endocarditis 2008  . Hemorrhoids   . History of mitral valve replacement    needs SBE prophylaxis  . History of prostate cancer remote past   f/u by Dr. Claris Che; no recurrence as of f/u 10/2015.  PSA rising from 2013 to 04/2018 (PSA 04/25/18=18.49): urol to rpt PSA and check CT and bone scan  approx 10/2018.  Marland Kitchen HTN (hypertension)   . Left lower lobe pulmonary nodule July/Aug 2019.   04/2018: enlarging irregular nodular opacity--referred to CT surgery 04/25/18.  . Mediastinal lymphadenopathy   . Pulmonary hypertension ([place]) 01/09/2015   secondary  . Right thyroid nodule   . Thrombocytopenia ([place])   . TIA (transient ischemic attack) 03/25/2011   ? of due to transient diplopia  . Ulcerative colitis    left-sided/segmental, associated with diverticulosis.  Sulfasalazine per GI (Dr. Carlean Purl).  . Warthin's tumor     Patient Active Problem List   Diagnosis Date Noted  . Mass of lower lobe of left lung 05/15/2018  . AKI (acute kidney injury) ([place]) 03/04/2018  . Community acquired pneumonia of left lower lobe of lung ([place]) 03/04/2018  . DM2 (diabetes mellitus, type 2) ([place]) 03/04/2018  . Acute on chronic respiratory failure with hypoxia ([place]) 07/18/2017  . Chronic congestive heart failure ([place]) 07/18/2017  . Pruritus 09/02/2015  . Maxillary sinusitis 08/17/2015  .  Eye irritation 08/17/2015  . Chronic hypoxemic respiratory failure ([place]) 07/17/2015  . Pulmonary hypertension ([place]) 01/09/2015  . Anxiety state 11/28/2014  . SOB (shortness of breath) 11/28/2014  . Cerebrovascular disease 11/20/2014  . Emotional lability 11/11/2014  . Tachycardia 11/04/2014  . Encounter for therapeutic drug monitoring 03/18/2014  . Atrial fibrillation ([place]) 07/25/2011  . Long term current use of anticoagulant 10/20/2010  .  Essential hypertension, benign 08/20/2009  . HYPERLIPIDEMIA, FAMILIAL 08/03/2009  . Coronary atherosclerosis 08/03/2009  . MITRAL VALVE REPLACEMENT, HX OF 08/03/2009  . THYROID NODULE, RIGHT 12/03/2008  . CEREBROVASCULAR ACCIDENT, HX OF 12/03/2008  . COPD (chronic obstructive pulmonary disease) with emphysema ([place]) 06/09/2008  . Segmental colitis associated with sigmoid diverticulosis 12/12/2007  . PROSTATE CANCER, HX OF 01/12/2007  . ENDOCARDITIS 07/04/2006    Class: History of    Past Surgical History:  Procedure Laterality Date  . carotid duplex dopplers  04/2014   No signif plaques; repeat 2 yrs per cardiology  . CATARACT EXTRACTION, BILATERAL    . COLONOSCOPY W/ BIOPSIES  12/19/2007   left-sided colitis, diverticulosis, hemorrhoids  . FLEXIBLE SIGMOIDOSCOPY  01/11/2008; 03/19/2010   2009 and 2011:segmental left colitis, diverticulosis, hemorrhoids  . INGUINAL HERNIA REPAIR     right  . MITRAL VALVE REPAIR  09/22/04   and modified Cox-Maze IV   . MITRAL VALVE REPAIR  09/14/05   redo MV repair d/t endocardiits/embolic events  . PROSTATECTOMY  06/2003   Radical  . RIGHT HEART CATHETERIZATION N/A 01/01/2015   Procedure: RIGHT HEART CATH;  Surgeon: Peter M Martinique, MD;  Location: [facility] CATH LAB;  Service: Cardiovascular;  Laterality: N/A;  . SALIVARY GLAND SURGERY     left resection  . TRANSTHORACIC ECHOCARDIOGRAM  11/2013; 07/2017   2015: Mod LVH, EF 55-60%, no WM abnormalities, +LA dilation, +severe RV dilation and impaired systolic fxn, +increased pulm art pressures.  Valves ok.  2018- EF 60-65%, wall motion nl, s/p MV repair, mild RV syst dysf.         Home Medications    Prior to Admission medications   Medication Sig Start Date End Date Taking? Authorizing Provider  albuterol (PROVENTIL HFA;VENTOLIN HFA) 108 (90 Base) MCG/ACT inhaler INHALE ONE PUFF BY MOUTH EVERY 6 HOURS AS NEEDED FOR WHEEZING AND FOR SHORTNESS OF BREATH (USING 5-6 TIMES EVERY 24 HOURS) 01/01/18   Collene Gobble, MD  albuterol (PROVENTIL) (2.5 MG/3ML) 0.083% nebulizer solution Take 3 mLs (2.5 mg total) by nebulization every 4 (four) hours. And as needed 12/29/17   Collene Gobble, MD  budesonide (PULMICORT) 0.5 MG/2ML nebulizer solution USE ONE VIAL IN NEBULIZER TWICE DAILY 09/27/17   Collene Gobble, MD  digoxin (LANOXIN) 0.125 MG tablet Take 1 tablet (0.125 mg total) by mouth daily. 01/10/18   Lelon Perla, MD  diltiazem (CARDIZEM CD) 240 MG 24 hr capsule Take 1 capsule (240 mg total) by mouth daily. 01/10/18   Lelon Perla, MD  furosemide (LASIX) 40 MG tablet Take 1 tablet (40 mg total) by mouth daily. 03/08/18 05/08/18  Arrien, Jimmy Picket, MD  glipiZIDE (GLUCOTROL XL) 5 MG 24 hr tablet Take 1 tablet (5 mg total) by mouth daily with breakfast. 04/25/18   McGowen, Adrian Blackwater, MD  LORazepam (ATIVAN) 0.5 MG tablet Take 1 tablet by mouth daily as needed. 01/31/18   [provider]  pravastatin (PRAVACHOL) 40 MG tablet TAKE 1 TABLET EVERY EVENING Patient taking differently: Take 40 mg by mouth daily. TAKE 1 TABLET EVERY EVENING  01/10/18   Lelon Perla, MD  predniSONE (DELTASONE) 20 MG tablet Take 1 tablet (20 mg total) by mouth daily with breakfast for 14 days. 05/15/18 05/29/18  Fenton Foy, NP  predniSONE (DELTASONE) 20 MG tablet Take 3 tablets (60 mg total) by mouth daily for 4 days. 05/19/18 05/23/18  Kenleigh Toback, DO  STIOLTO RESPIMAT 2.5-2.5 MCG/ACT AERS INHALE 2 PUFFS INTO THE LUNGS DAILY. 09/20/17   Collene Gobble, MD  warfarin (COUMADIN) 5 MG tablet Take 0.5-1 tablets (2.5-5 mg total) by mouth See admin instructions. 5mg  daily except Friday take 2.5mg  AS DIRECTED BY COUMADIN CLINIC Patient taking differently: Take 2.5-5 mg by mouth daily.  01/10/18   Lelon Perla, MD    Family History Family History  Problem Relation Age of Onset  . Cancer Mother        spinal  . Stroke Father   . Heart disease Father   . Colon cancer Neg Hx   . Heart attack Neg Hx     Social  History Social History   Tobacco Use  . Smoking status: Former Smoker    Packs/day: 3.00    Years: 60.00    Pack years: 180.00    Types: Cigarettes    Last attempt to quit: 09/19/2002    Years since quitting: 15.6  . Smokeless tobacco: Never Used  Substance Use Topics  . Alcohol use: No    Alcohol/week: 0.0 standard drinks  . Drug use: No     Allergies   Patient has no known allergies.   Review of Systems Review of Systems  Constitutional: Negative for chills and fever.  HENT: Negative for ear pain, rhinorrhea and sore throat.   Eyes: Negative for pain and visual disturbance.  Respiratory: Positive for cough, shortness of breath and wheezing. Negative for hemoptysis and sputum production.   Cardiovascular: Negative for chest pain, palpitations, orthopnea, claudication, leg swelling, syncope and PND.  Gastrointestinal: Negative for abdominal pain and vomiting.  Genitourinary: Negative for dysuria and hematuria.  Musculoskeletal: Negative for arthralgias and back pain.  Skin: Negative for color change and rash.  Neurological: Negative for seizures and syncope.  All other systems reviewed and are negative.    Physical Exam Updated Vital Signs  ED Triage Vitals  Enc Vitals Group     BP 05/19/18 1632 135/75     Pulse Rate 05/19/18 1632 (!) 113     Resp 05/19/18 1632 20     Temp 05/19/18 1632 98.2 F (36.8 C)     Temp Source 05/19/18 1632 Oral     SpO2 05/19/18 1632 98 %     Weight 05/19/18 1633 185 lb 13.6 oz (84.3 kg)     Height 05/19/18 1633 5\' 7"  (1.702 m)     Head Circumference --      Peak Flow --      Pain Score 05/19/18 1633 0     Pain Loc --      Pain Edu? --      Excl. in [place]? --     Physical Exam  Constitutional: He appears well-developed and well-nourished.  HENT:  Head: Normocephalic and atraumatic.  Eyes: Pupils are equal, round, and reactive to light. Conjunctivae and EOM are normal.  Neck: Normal range of motion. Neck supple.  Cardiovascular:  Normal rate, regular rhythm, normal heart sounds and intact distal pulses.  No murmur heard. Pulmonary/Chest: Tachypnea noted. No respiratory distress. He has decreased breath sounds. He has wheezes.  Abdominal: Soft. There  is no tenderness.  Musculoskeletal: Normal range of motion. He exhibits no edema.       Right lower leg: He exhibits no edema.       Left lower leg: He exhibits no edema.  Neurological: He is alert.  Skin: Skin is warm and dry.  Psychiatric: He has a normal mood and affect.  Nursing note and vitals reviewed.    ED Treatments / Results  Labs (all labs ordered are listed, but only abnormal results are displayed) Labs Reviewed  COMPREHENSIVE METABOLIC PANEL - Abnormal; Notable for the following components:      Result Value   Potassium 3.3 (*)    Glucose, Bld 106 (*)    Calcium 8.8 (*)    Albumin 3.4 (*)    Total Bilirubin 1.3 (*)    GFR calc non Af Amer 53 (*)    All other components within normal limits  CBC WITH DIFFERENTIAL/PLATELET - Abnormal; Notable for the following components:   Platelets 142 (*)    Abs Immature Granulocytes 0.2 (*)    All other components within normal limits  BRAIN NATRIURETIC PEPTIDE - Abnormal; Notable for the following components:   B Natriuretic Peptide 113.2 (*)    All other components within normal limits  PROTIME-INR - Abnormal; Notable for the following components:   Prothrombin Time 20.4 (*)    All other components within normal limits  I-STAT VENOUS BLOOD GAS, ED - Abnormal; Notable for the following components:   pH, Ven 7.467 (*)    pCO2, Ven 34.3 (*)    pO2, Ven 61.0 (*)    All other components within normal limits  BLOOD GAS, VENOUS  I-STAT TROPONIN, ED  I-STAT TROPONIN, ED    EKG EKG Interpretation  Date/Time:  Saturday May 19 2018 16:27:53 EDT Ventricular Rate:  113 PR Interval:    QRS Duration: 107 QT Interval:  324 QTC Calculation: 445 R Axis:   -161 Text Interpretation:  Sinus or ectopic atrial  tachycardia Probable right ventricular hypertrophy Lateral infarct, old Confirmed by Lennice Sites [id number]) on 05/19/2018 4:31:01 PM   Radiology Ct Angio Chest Pe W And/or Wo Contrast  Result Date: 05/19/2018 CLINICAL DATA:  Per ed notes: Pt brought in by EMS from home for c/o generalized weakness x1week along with SOB that began 2 days ago ; pt reports having a chronic cough x 6 months EXAM: CT ANGIOGRAPHY CHEST WITH CONTRAST TECHNIQUE: Multidetector CT imaging of the chest was performed using the standard protocol during bolus administration of intravenous contrast. Multiplanar CT image reconstructions and MIPs were obtained to evaluate the vascular anatomy. CONTRAST:  160mL ISOVUE-370 IOPAMIDOL (ISOVUE-370) INJECTION 76% COMPARISON:  04/24/2018 FINDINGS: Cardiovascular: Four-chamber cardiac enlargement. No pericardial effusion. Satisfactory opacification of pulmonary arteries noted, and there is no evidence of pulmonary emboli. Motion degrades some images through the lung bases. Previous mitral valve surgery. Coronary calcifications. Atheromatous thoracic aorta without aneurysm. Mediastinum/Nodes: Inferior left hilar adenopathy up to 1.6 cm, stable. No new hilar or mediastinal adenopathy. Lungs/Pleura: No pleural effusion. No pneumothorax. 5.3 cm masslike consolidation in the left lower lobe, previously 5.2 cm. Pulmonary emphysema. Dependent atelectasis bilaterally. Stable 4 mm right upper lobe pulmonary nodule image 32/6. No new pulmonary nodule. Upper Abdomen: Atheromatous aorta. No acute findings. 2.2 cm left adrenal adenoma. Musculoskeletal: Previous median sternotomy. No fracture or worrisome bone lesion. Review of the MIP images confirms the above findings. IMPRESSION: 1. No acute PE. 2. Stable left lower lobe masslike consolidation and right infrahilar adenopathy. No  acute pulmonary findings. Electronically Signed   By: Lucrezia Europe M.D.   On: 05/19/2018 22:23   Dg Chest Portable 1 View  Result  Date: 05/19/2018 CLINICAL DATA:  Pt brought in by EMS from home for c/o generalized weakness x [redacted]week along with SOB that began 2 days ago ; pt reports having a chronic cough x 6 months ; pt also reports having a test recently done to rule out lung CA , pt states.*comment was truncated* EXAM: PORTABLE CHEST 1 VIEW COMPARISON:  05/16/2018 FINDINGS: Median sternotomy and valve replacement. The heart is mildly enlarged. There is pulmonary vascular congestion. Patchy opacities are again identified at the lung bases and are stable. IMPRESSION: Stable appearance of the chest.  Persistent bibasilar opacities. Electronically Signed   By: Nolon Nations M.D.   On: 05/19/2018 19:29    Procedures Procedures (including critical care time)  Medications Ordered in ED Medications  predniSONE (DELTASONE) tablet 60 mg (60 mg Oral Given 05/19/18 1825)  iopamidol (ISOVUE-370) 76 % injection (has no administration in time range)  albuterol (PROVENTIL,VENTOLIN) solution continuous neb (10 mg/hr Nebulization Given 05/19/18 1641)  ipratropium (ATROVENT) nebulizer solution 0.5 mg (0.5 mg Nebulization Given 05/19/18 1641)  iopamidol (ISOVUE-370) 76 % injection 100 mL (100 mLs Intravenous Contrast Given 05/19/18 2146)     Initial Impression / Assessment and Plan / ED Course  I have reviewed the triage vital signs and the nursing notes.  Pertinent labs & imaging results that were available during my care of the patient were reviewed by me and considered in my medical decision making (see chart for details).     [name] is an 82 year old male with history of COPD, CHF, CAD who presents to the ED with shortness of breath.  Patient with mild tachycardia upon arrival but otherwise normal vitals.  Patient stable on home 3 L of oxygen.  Patient with gradual shortness of breath over the last several days.  Has had chronic cough no sputum production.  Denies any chest pain.  No leg swelling.  Patient is on anticoagulation  for atrial fibrillation.  EKG shows sinus rhythm with no signs of ischemic changes.  Patient with good air movement on exam, mild wheezing.  Normal work of breathing.  Patient overall feels mildly more short of breath than normal.  Patient with no signs of volume overload on exam.  Exam is overall unremarkable.  No signs of obvious respiratory distress.  Patient had serial troponins are unremarkable.  No chest pain and doubt ACS.  Patient had chest x-ray that showed no signs of pneumonia, pneumothorax, pleural effusion.  Patient had no significant anemia, electrolyte abnormality, kidney injury.  INR mildly subtherapeutic.  Therefore, CT PE study was performed and showed no acute PE.  No acute findings.  No pneumonia.   Patient had continuous breathing treatment, prednisone given for mild COPD exacerbation.  Blood gas showed overall normal findings.  No hypercarbia, no acidosis.  Patient is chronically compensated. Stable on home 02 and good air movement on exam. No overload on exam or imaging and doubt heart failure exacerbation.  After discussion with patient he would prefer discharge to home as he would like to get breathing treatments at home and will increase his prednisone to 60 mg for the next several days.  Offered patient possible admission for breathing treatments but patient would like to try treatments at home.  Patient discharged from ED in good condition and told to return to the ED if symptoms worsen.  Recommend that he does home nebulizer every 4 hours while he is awake for the next 24 hours and then as needed.  This chart was dictated using voice recognition software.  Despite best efforts to proofread,  errors can occur which can change the documentation meaning.  Final Clinical Impressions(s) / ED Diagnoses   Final diagnoses:  COPD exacerbation ([place])  SOB (shortness of breath)    ED Discharge Orders         Ordered    predniSONE (DELTASONE) 20 MG tablet  Daily,   Status:   Discontinued     05/19/18 2322    predniSONE (DELTASONE) 20 MG tablet  Daily     05/19/18 [place], Arbuckle, DO 05/20/18 [phone]

## 2018-05-19 NOTE — ED Triage Notes (Signed)
Pt brought in by EMS from home for c/o generalized weakness x1week along with SOB that began 2 days ago ; pt reports having a chronic cough x 6 months ; pt also reports having a test recently done to rule out lung CA , pt states it was negative and will have to repeat the test in 3 months; pt was 88% on 2L of [place] upon EMS arrival ; hr 114 and in afib with known history; pt states he used his albuterol inhaler at home with some relief; pt denies any CP

## 2018-05-22 ENCOUNTER — Telehealth: Payer: Self-pay | Admitting: Emergency Medicine

## 2018-05-22 DIAGNOSIS — J449 Chronic obstructive pulmonary disease, unspecified: Secondary | ICD-10-CM

## 2018-05-22 DIAGNOSIS — J9611 Chronic respiratory failure with hypoxia: Secondary | ICD-10-CM

## 2018-05-22 NOTE — Telephone Encounter (Signed)
Called and spoke with patient, he is wanting a refill of prednisone sent to his [facility].   Tonya please advise on which prednisone prescription patient needs to be on. Thank you.

## 2018-05-23 MED ORDER — PREDNISONE 20 MG PO TABS: 20.0000 mg | ORAL_TABLET | Freq: Every day | ORAL | 0 refills | Status: DC

## 2018-05-23 NOTE — Telephone Encounter (Signed)
He is on prednisone 20 mg daily. It is filled through [facility] order pharmacy. We had sent in a 2 week prescription to regular pharmacy because he said they were already filling the prednisone. If he still needs it ordered then it should be prednisone 20 mg daily.

## 2018-05-23 NOTE — Telephone Encounter (Signed)
Spoke with wife Jeanett Schlein, script sent to [facility]. Made aware script was sent to [facility] to hold him over until [facility] order arrives. Voiced understanding. Nothing further needed at this time.

## 2018-05-28 ENCOUNTER — Ambulatory Visit (INDEPENDENT_AMBULATORY_CARE_PROVIDER_SITE_OTHER): Payer: Medicare Other

## 2018-05-28 ENCOUNTER — Ambulatory Visit (INDEPENDENT_AMBULATORY_CARE_PROVIDER_SITE_OTHER): Payer: Medicare Other | Admitting: Emergency Medicine

## 2018-05-28 ENCOUNTER — Encounter: Payer: Self-pay | Admitting: Family Medicine

## 2018-05-28 VITALS — BP 110/68 | HR 73 | Ht 67.0 in | Wt 185.0 lb

## 2018-05-28 DIAGNOSIS — R911 Solitary pulmonary nodule: Secondary | ICD-10-CM | POA: Diagnosis not present

## 2018-05-28 DIAGNOSIS — I4891 Unspecified atrial fibrillation: Secondary | ICD-10-CM | POA: Diagnosis not present

## 2018-05-28 DIAGNOSIS — Z23 Encounter for immunization: Secondary | ICD-10-CM

## 2018-05-28 DIAGNOSIS — R918 Other nonspecific abnormal finding of lung field: Secondary | ICD-10-CM | POA: Diagnosis not present

## 2018-05-28 DIAGNOSIS — J9611 Chronic respiratory failure with hypoxia: Secondary | ICD-10-CM

## 2018-05-28 DIAGNOSIS — I2510 Atherosclerotic heart disease of native coronary artery without angina pectoris: Secondary | ICD-10-CM | POA: Diagnosis not present

## 2018-05-28 DIAGNOSIS — Z5181 Encounter for therapeutic drug level monitoring: Secondary | ICD-10-CM

## 2018-05-28 DIAGNOSIS — J449 Chronic obstructive pulmonary disease, unspecified: Secondary | ICD-10-CM

## 2018-05-28 LAB — POCT INR: INR: 4.5 — AB (ref 2.0–3.0)

## 2018-05-28 MED ORDER — BUDESONIDE 0.5 MG/2ML IN SUSP: 0.5000 mg | Freq: Two times a day (BID) | RESPIRATORY_TRACT | 5 refills | Status: DC

## 2018-05-28 MED ORDER — PREDNISONE 20 MG PO TABS: 20.0000 mg | ORAL_TABLET | Freq: Every day | ORAL | 1 refills | Status: DC

## 2018-05-28 MED ORDER — PREDNISONE 20 MG PO TABS: 20.0000 mg | ORAL_TABLET | Freq: Every day | ORAL | 0 refills | Status: AC

## 2018-05-28 NOTE — Assessment & Plan Note (Signed)
Severe disease both oxygen and steroid-dependent.  He is on maximal bronchodilator therapy.  I explained to him today that unclear that there is any way to ramp up therapy beyond increasing his prednisone.  Approach the subject of palliative care, explained that he may need this at some point.  This was just an introduction and we did not talk about enacting palliative care.  I will increase his prednisone to 30 mg daily for the next month, see if he gets any benefit.   Please continue Stiolto as you have been taking it. Keep albuterol available to use either 2 puffs or 1 nebulizer treatment up to every 4 hours if needed for shortness of breath. We will try increasing her prednisone to 30 mg daily until next visit to see if you benefit. Follow with Dr Lamonte Sakai in 1 month or next available

## 2018-05-28 NOTE — Assessment & Plan Note (Signed)
Continue 3 L/min at all times.  Could consider a repeat titration going forward.  His ambulation is poor and I am not sure he could exert to get accurate data.

## 2018-05-28 NOTE — Assessment & Plan Note (Signed)
Suspect that this is a slow-growing mass, low level hypermetabolism on PET scan, no change in size.  I agree that he is a poor candidate for biopsy or therapy and a conservative approach is appropriate.  We will repeat his CT chest in 3 months, December.  Based on its appearance I do not think this is residual pneumonia.  I will hold off on empiric antibiotics.

## 2018-05-28 NOTE — Patient Instructions (Signed)
Description   Skip 2 dosages of Coumadin (Tuesday and Wednesday), then resume same dosage 1 tablet daily except 1/2 tablet on Mondays, Wednesdays, and Fridays. Increasing Prednisone 30 mg daily today 05/28/17 as maintenance dose. Recheck INR in 1 week.  Call with new medications or any changes [phone].

## 2018-05-28 NOTE — Progress Notes (Signed)
Subjective:    Patient ID: [name], male    DOB: 1935-12-10, 82 y.o.   MRN: [id number]  COPD  His past medical history is significant for COPD.   ROV 12/29/17 --patient follows up today for his history of tobacco use and moderate to severe obstruction/COPD with associated hypoxemic respiratory failure.  He has a history of mitral valve repair, atrial fibrillation (maze), coronary disease with secondary pulmonary hypertension.  He has been on chronic prednisone long-term, currently on 20 mg daily. He is having more nasal congestion since last time. Also some increased LE edema for the last month, started glipizide at that time. Minimal wheeze, occasional cough happens  every day, productive of yellow to dark, thick. He is on [employer]. He uses a neb every morning, I think it is budesonide - he can't tell me. He believes that he has had nmore LE edema since last time. Has increased his lasix for last couple days. He is scheduled to see Dr Stanford Breed next week.   ROV 05/28/18 --82 year old man with severe COPD, associated hypoxemic respiratory failure, history of atrial fibrillation (maze), coronary disease, mitral valve repair.  He has been on oxygen and chronic prednisone dependent.    He was treated for a left lower lobe pneumonia due to a rounded infiltrate noted on CT chest 03/04/2018.  A follow-up CT from 04/24/2018 showed interval increase in size of the left lower lobe consolidated irregular area.  This prompted a PET scan that was done on 05/16/2018 which I have reviewed.  There was a slight decrease in size in the left lower lobe opacity with mild FDG uptake, most consistent with a resolving inflammatory process as opposed to malignancy.  A 4 mm right upper lobe nodule was stable in size.  He saw Dr. Servando Snare to review.  A biopsy was deferred and the plan was made to follow his imaging, next in 3 months.   He was back in the ED 8/31 with acute dyspnea, was treated with continuous nebs. No real  evidence pulm edema.   Remains on stiolto, pred 20mg  daily. He is using albuterol nebs 4-5x a day. He is coughing up yellow mucous intermittently. No fevers. He is confused about the regimen - wife answers the questions. His o2 is usually on 3L/min. He has seen some desats to mid-80's at times w exertion.   No flowsheet data found.  Review of Systems As per HPI     Objective:   Physical Exam Vitals:   05/28/18 1052  BP: 110/68  Pulse: 73  SpO2: 95%  Weight: 185 lb (83.9 kg)  Height: 5\' 7"  (1.702 m)   Gen: Pleasant, elderly gentleman, in no distress,  normal affect on 3 L/m pulse flow  ENT: No lesions,  mouth clear,  oropharynx clear, no postnasal drip  Neck: No JVD, no stridor  Lungs: No use of accessory muscles, Distant but clear.  No wheezing, no crackles  Cardiovascular: distant, irregular, no murmur or gallops, no peripheral edema  Musculoskeletal: No deformities, no cyanosis or clubbing  Neuro: alert, non focal  Skin: Warm, bruising on both deltoids      Assessment & Plan:  COPD (chronic obstructive pulmonary disease) with emphysema (HCC) Severe disease both oxygen and steroid-dependent.  He is on maximal bronchodilator therapy.  I explained to him today that unclear that there is any way to ramp up therapy beyond increasing his prednisone.  Approach the subject of palliative care, explained that he may need this at  some point.  This was just an introduction and we did not talk about enacting palliative care.  I will increase his prednisone to 30 mg daily for the next month, see if he gets any benefit.   Please continue Stiolto as you have been taking it. Keep albuterol available to use either 2 puffs or 1 nebulizer treatment up to every 4 hours if needed for shortness of breath. We will try increasing her prednisone to 30 mg daily until next visit to see if you benefit. Follow with Dr Lamonte Sakai in 1 month or next available  Mass of lower lobe of left lung Suspect  that this is a slow-growing mass, low level hypermetabolism on PET scan, no change in size.  I agree that he is a poor candidate for biopsy or therapy and a conservative approach is appropriate.  We will repeat his CT chest in 3 months, December.  Based on its appearance I do not think this is residual pneumonia.  I will hold off on empiric antibiotics.  Chronic hypoxemic respiratory failure (HCC) Continue 3 L/min at all times.  Could consider a repeat titration going forward.  His ambulation is poor and I am not sure he could exert to get accurate data.  Baltazar Apo, MD, PhD 05/28/2018, 11:31 AM [facility] [phone] or if no answer [phone]

## 2018-05-28 NOTE — Patient Instructions (Signed)
Please continue Stiolto as you have been taking it. Keep albuterol available to use either 2 puffs or 1 nebulizer treatment up to every 4 hours if needed for shortness of breath. We will try increasing her prednisone to 30 mg daily until next visit to see if you benefit. We will hold off on treating you with antibiotics right now. Continue your oxygen at 3 L/min at all times. We will plan to repeat your CT scan of the chest without contrast  in December 2019 to follow left lower lobe mass Follow with Dr Lamonte Sakai in 1 month or next available.

## 2018-05-31 ENCOUNTER — Other Ambulatory Visit: Payer: Self-pay | Admitting: *Deleted

## 2018-06-04 ENCOUNTER — Ambulatory Visit (INDEPENDENT_AMBULATORY_CARE_PROVIDER_SITE_OTHER): Payer: Medicare Other

## 2018-06-04 DIAGNOSIS — Z5181 Encounter for therapeutic drug level monitoring: Secondary | ICD-10-CM | POA: Diagnosis not present

## 2018-06-04 DIAGNOSIS — I4891 Unspecified atrial fibrillation: Secondary | ICD-10-CM

## 2018-06-04 LAB — POCT INR: INR: 1.9 — AB (ref 2.0–3.0)

## 2018-06-04 NOTE — Patient Instructions (Signed)
Description   Take an extra 1/2 tablet today, then resume same dosage 1 tablet daily except 1/2 tablet on Mondays, Wednesdays, and Fridays. Increasing Prednisone 30 mg daily today 05/28/17 as maintenance dose. Recheck INR in 10 days.  Call with new medications or any changes [phone].

## 2018-06-13 ENCOUNTER — Ambulatory Visit (INDEPENDENT_AMBULATORY_CARE_PROVIDER_SITE_OTHER): Payer: Medicare Other | Admitting: Pharmacist

## 2018-06-13 DIAGNOSIS — Z5181 Encounter for therapeutic drug level monitoring: Secondary | ICD-10-CM | POA: Diagnosis not present

## 2018-06-13 DIAGNOSIS — I4891 Unspecified atrial fibrillation: Secondary | ICD-10-CM

## 2018-06-13 DIAGNOSIS — H6983 Other specified disorders of Eustachian tube, bilateral: Secondary | ICD-10-CM | POA: Diagnosis not present

## 2018-06-13 LAB — POCT INR: INR: 2.2 (ref 2.0–3.0)

## 2018-06-13 NOTE — Patient Instructions (Signed)
Description   Continue same dosage 1 tablet daily except 1/2 tablet on Mondays, Wednesdays, and Fridays. Increasing Prednisone 30 mg daily 05/28/17 as maintenance dose. Recheck INR in 2 weeks.  Call with new medications or any changes [phone].

## 2018-06-19 ENCOUNTER — Other Ambulatory Visit: Payer: Self-pay | Admitting: *Deleted

## 2018-06-19 DIAGNOSIS — R918 Other nonspecific abnormal finding of lung field: Secondary | ICD-10-CM

## 2018-06-25 ENCOUNTER — Other Ambulatory Visit: Payer: Self-pay | Admitting: Emergency Medicine

## 2018-06-26 ENCOUNTER — Other Ambulatory Visit: Payer: Self-pay | Admitting: Cardiology

## 2018-07-02 ENCOUNTER — Ambulatory Visit (INDEPENDENT_AMBULATORY_CARE_PROVIDER_SITE_OTHER): Payer: Medicare Other | Admitting: *Deleted

## 2018-07-02 DIAGNOSIS — Z5181 Encounter for therapeutic drug level monitoring: Secondary | ICD-10-CM

## 2018-07-02 DIAGNOSIS — I4891 Unspecified atrial fibrillation: Secondary | ICD-10-CM | POA: Diagnosis not present

## 2018-07-02 LAB — POCT INR: INR: 2 (ref 2.0–3.0)

## 2018-07-02 NOTE — Patient Instructions (Signed)
Description   Continue same dosage 1 tablet daily except 1/2 tablet on Mondays, Wednesdays, and Fridays. Increasing Prednisone 30 mg daily 05/28/17 as maintenance dose. Recheck INR in 3 weeks.  Call with new medications or any changes [phone].

## 2018-07-09 NOTE — Progress Notes (Signed)
HPI:FU MV repair, AFib/flutter, s/p MAZE procedure, nonobstructive CAD, prior embolic CVA after initial MV repair secondary to endocarditis requiring redo surgery (repair), diastolic CHF, COPD, HL, HTN.Holter monitor June 2016 showed atrial fibrillation rate controlled. Right heart catheterization April 2016 showed a right atrial pressure of 8, PA pressure of 59/23 and pulmonary Wedge pressure of 18. Findings consistent with moderate pulmonary hypertension and upper normal left ventricular filling pressures.Carotid Dopplers October 2017 showed1-39 bilateral stenosis. Followed by pulmonary because of COPD.    Echocardiogram June 2019 showed normal LV function, mild aortic insufficiency, status post mitral valve repair with elevated mean gradient of 12 mmHg and biatrial enlargement.  Chest CT August 2019 showed no pulmonary embolus.  PET scan August 2019 showed masslike area of consolidation posterior left lower lobe and follow-up recommended 3 months.  4 mm right upper lobe nodule and noncontrast suggested 12 months.  Also with emphysema and 3.4 cm infrarenal abdominal aortic aneurysm.  Patient felt to be too high risk for surgical intervention or biopsy of lung mass.  Now followed by pulmonary.  Since last seen,  he continues to have dyspnea on exertion.  No orthopnea or PND.  He has noticed increasing pedal edema over the past 2 to 3 months.  He denies chest pain, palpitations or syncope.  Current Outpatient Medications  Medication Sig Dispense Refill  . albuterol (PROVENTIL HFA;VENTOLIN HFA) 108 (90 Base) MCG/ACT inhaler INHALE ONE PUFF BY MOUTH EVERY 6 HOURS AS NEEDED FOR WHEEZING AND FOR SHORTNESS OF BREATH (USING 5-6 TIMES EVERY 24 HOURS) 1 Inhaler 5  . albuterol (PROVENTIL) (2.5 MG/3ML) 0.083% nebulizer solution Take 3 mLs (2.5 mg total) by nebulization every 4 (four) hours. And as needed 300 mL 5  . budesonide (PULMICORT) 0.5 MG/2ML nebulizer solution Take 2 mLs (0.5 mg total) by  nebulization 2 (two) times daily. 120 mL 5  . budesonide (PULMICORT) 0.5 MG/2ML nebulizer solution USE 1 VIAL IN NEBULIZER TWICE DAILY 120 mL 5  . digoxin (LANOXIN) 0.125 MG tablet Take 1 tablet (0.125 mg total) by mouth daily. 90 tablet 3  . diltiazem (CARDIZEM CD) 240 MG 24 hr capsule Take 1 capsule (240 mg total) by mouth daily. 90 capsule 3  . furosemide (LASIX) 40 MG tablet Take 1 tablet (40 mg total) by mouth daily. 30 tablet 0  . glipiZIDE (GLUCOTROL XL) 5 MG 24 hr tablet Take 1 tablet (5 mg total) by mouth daily with breakfast. 90 tablet 1  . LORazepam (ATIVAN) 0.5 MG tablet Take 1 tablet by mouth daily as needed.    . pravastatin (PRAVACHOL) 40 MG tablet TAKE 1 TABLET EVERY EVENING (Patient taking differently: Take 40 mg by mouth daily. TAKE 1 TABLET EVERY EVENING) 90 tablet 3  . predniSONE (DELTASONE) 20 MG tablet Take 1 tablet (20 mg total) by mouth daily with breakfast. (Patient taking differently: Take 30 mg by mouth daily with breakfast. ) 90 tablet 1  . STIOLTO RESPIMAT 2.5-2.5 MCG/ACT AERS INHALE 2 PUFFS INTO THE LUNGS DAILY. 12 g 3  . warfarin (COUMADIN) 5 MG tablet Take 1/2 to 1 tablet by mouth daily as directed 90 tablet 1   No current facility-administered medications for this visit.      Past Medical History:  Diagnosis Date  . Adrenal adenoma   . Atrial fibrillation (HCC)    Rate control + Coumadin  . Atrial flutter ([place])   . CAD (coronary artery disease)    Lexiscan Myoview (01/2014): Lateral soft tissue attenuation,  no ischemia, not gated, low risk  . Cancer ([place])   . CAP (community acquired pneumonia)    Hospitalized 10/2014  . Chronic diastolic heart failure ([place])   . Chronic hypoxemic respiratory failure (HCC)    from COPD  . Chronic renal insufficiency, stage III (moderate) (HCC)    CrCl 50s  . COPD (chronic obstructive pulmonary disease) (HCC)    oxygen-dependent (2.5 L 24/7).  Improved on starting chronic prednisone 06/2017 (increased to 20 mg qd 08/2017)   Progressive worsening dyspnea at pulm f/u 10/19/17--? changed to continuous 02 from pulsed delivery (?)--no change in med regimen.  . Diabetes mellitus without complication ([place]) 81/82/9937   A1c 6.5 % 05/2016  . Diverticulosis   . DOE (dyspnea on exertion)    Multifactorial: deconditioning, COPD, obesity hypoventilation syndrome.  Marland Kitchen Dyslipidemia   . Embolic stroke ([place])    d/t endocarditis 2008  . Hemorrhoids   . History of mitral valve replacement    needs SBE prophylaxis  . History of prostate cancer remote past   f/u by Dr. Claris Che; no recurrence as of f/u 10/2015.  PSA rising from 2013 to 04/2018 (PSA 04/25/18=18.49): urol to rpt PSA and check CT and bone scan  approx 10/2018.  Marland Kitchen HTN (hypertension)   . Left lower lobe pulmonary nodule July/Aug 2019.   04/2018: enlarging irregular nodular opacity.  CT surgery-->PET scan: inflamm/post-infect vs malignant. Plan f/u CT chest 3 mo per CT surg.  . Mediastinal lymphadenopathy   . Pulmonary hypertension ([place]) 01/09/2015   secondary  . Right thyroid nodule   . Thrombocytopenia ([place])   . TIA (transient ischemic attack) 03/25/2011   ? of due to transient diplopia  . Ulcerative colitis    left-sided/segmental, associated with diverticulosis.  Sulfasalazine per GI (Dr. Carlean Purl).  . Warthin's tumor     Past Surgical History:  Procedure Laterality Date  . carotid duplex dopplers  04/2014   No signif plaques; repeat 2 yrs per cardiology  . CATARACT EXTRACTION, BILATERAL    . COLONOSCOPY W/ BIOPSIES  12/19/2007   left-sided colitis, diverticulosis, hemorrhoids  . FLEXIBLE SIGMOIDOSCOPY  01/11/2008; 03/19/2010   2009 and 2011:segmental left colitis, diverticulosis, hemorrhoids  . INGUINAL HERNIA REPAIR     right  . MITRAL VALVE REPAIR  09/22/04   and modified Cox-Maze IV   . MITRAL VALVE REPAIR  09/14/05   redo MV repair d/t endocardiits/embolic events  . PROSTATECTOMY  06/2003   Radical  . RIGHT HEART CATHETERIZATION N/A 01/01/2015   Procedure: RIGHT  HEART CATH;  Surgeon: Peter M Martinique, MD;  Location: [facility] C[facility] LAB;  Service: Cardiovascular;  Laterality: N/A;  . SALIVARY GLAND SURGERY     left resection  . TRANSTHORACIC ECHOCARDIOGRAM  11/2013; 07/2017   2015: Mod LVH, EF 55-60%, no WM abnormalities, +LA dilation, +severe RV dilation and impaired systolic fxn, +increased pulm art pressures.  Valves ok.  2018- EF 60-65%, wall motion nl, s/p MV repair, mild RV syst dysf.     Social History   Socioeconomic History  . Marital status: Married    Spouse name: Jeanett Schlein  . Number of children: 2  . Years of education: Not on file  . Highest education level: Not on file  Occupational History  . Occupation: RETIRED    Comment: Careers adviser: RETIRED  Social Needs  . Financial resource strain: Not on file  . Food insecurity:    Worry: Not on file    Inability: Not on file  .  Transportation needs:    Medical: Not on file    Non-medical: Not on file  Tobacco Use  . Smoking status: Former Smoker    Packs/day: 3.00    Years: 60.00    Pack years: 180.00    Types: Cigarettes    Last attempt to quit: 09/19/2002    Years since quitting: 15.8  . Smokeless tobacco: Never Used  Substance and Sexual Activity  . Alcohol use: No    Alcohol/week: 0.0 standard drinks  . Drug use: No  . Sexual activity: Not on file  Lifestyle  . Physical activity:    Days per week: Not on file    Minutes per session: Not on file  . Stress: Not on file  Relationships  . Social connections:    Talks on phone: Not on file    Gets together: Not on file    Attends religious service: Not on file    Active member of club or organization: Not on file    Attends meetings of clubs or organizations: Not on file    Relationship status: Not on file  . Intimate partner violence:    Fear of current or ex partner: Not on file    Emotionally abused: Not on file    Physically abused: Not on file    Forced sexual activity: Not on file  Other Topics Concern  . Not  on file  Social History Narrative   Married, 2 children.   Retired Systems developer from [employer]   180 pack-year tob hx.  No alcohol.    Family History  Problem Relation Age of Onset  . Cancer Mother        spinal  . Stroke Father   . Heart disease Father   . Colon cancer Neg Hx   . Heart attack Neg Hx     ROS: no fevers or chills, productive cough, hemoptysis, dysphasia, odynophagia, melena, hematochezia, dysuria, hematuria, rash, seizure activity, orthopnea, PND, claudication. Remaining systems are negative.  Physical Exam: Well-developed well-nourished in no acute distress.  Skin is warm and dry.  HEENT is normal.  Neck is supple.  Chest is clear to auscultation with normal expansion.  Cardiovascular exam is irregular Abdominal exam nontender or distended. No masses palpated. Extremities show 1+ edema. neuro grossly intact  ECG-atrial fibrillation at a rate of 111.  PVCs or aberrantly conducted beats.  Right axis deviation.  Nonspecific ST changes.  Personally reviewed  A/P  1 paroxysmal atrial fibrillation-plan to continue Cardizem and digoxin.  Continue Coumadin with goal INR 2-3.  2 hypertension-blood pressure is controlled.  Continue present medications and follow.  3 coronary artery disease-plan to continue statin.  He is not on aspirin given need for Coumadin.  4 hyperlipidemia-continue statin.  5 prior mitral valve repair-continue SBE prophylaxis.  Patient has an elevated mean gradient across his mitral valve.  However he is clear that he would never consider surgery again and given COPD and lung mass he would likely not be a candidate.  He is not significantly volume overloaded on examination.  Continue present dose of Lasix.  6 carotid artery disease-mild on most recent Dopplers.  7 dyspnea-felt to be multifactorial including mitral valve disease, deconditioning, obesity hypoventilation syndrome and COPD.  8 lung mass-followed by pulmonary.  9 abdominal  aortic aneurysm-will need follow-up studies in the future though doubt he would be a candidate for repair.  10 lower extremity edema-patient has worsening lower extremity edema.  Increase Lasix to 80 mg daily  for 2 days then 60 mg daily thereafter.  Check potassium and renal function in 1 week.  We discussed the importance of fluid restriction and low-sodium diet.  Kirk Ruths, MD

## 2018-07-16 ENCOUNTER — Ambulatory Visit: Payer: Medicare Other | Admitting: Emergency Medicine

## 2018-07-17 ENCOUNTER — Ambulatory Visit (INDEPENDENT_AMBULATORY_CARE_PROVIDER_SITE_OTHER): Payer: Medicare Other | Admitting: Emergency Medicine

## 2018-07-17 ENCOUNTER — Encounter: Payer: Self-pay | Admitting: Cardiology

## 2018-07-17 ENCOUNTER — Encounter: Payer: Self-pay | Admitting: Emergency Medicine

## 2018-07-17 ENCOUNTER — Ambulatory Visit (INDEPENDENT_AMBULATORY_CARE_PROVIDER_SITE_OTHER): Payer: Medicare Other | Admitting: Cardiology

## 2018-07-17 VITALS — BP 122/46 | HR 102 | Ht 68.0 in | Wt 191.0 lb

## 2018-07-17 DIAGNOSIS — I2510 Atherosclerotic heart disease of native coronary artery without angina pectoris: Secondary | ICD-10-CM

## 2018-07-17 DIAGNOSIS — R601 Generalized edema: Secondary | ICD-10-CM

## 2018-07-17 DIAGNOSIS — J439 Emphysema, unspecified: Secondary | ICD-10-CM | POA: Diagnosis not present

## 2018-07-17 DIAGNOSIS — I10 Essential (primary) hypertension: Secondary | ICD-10-CM

## 2018-07-17 DIAGNOSIS — E7800 Pure hypercholesterolemia, unspecified: Secondary | ICD-10-CM

## 2018-07-17 DIAGNOSIS — I4821 Permanent atrial fibrillation: Secondary | ICD-10-CM | POA: Diagnosis not present

## 2018-07-17 DIAGNOSIS — R918 Other nonspecific abnormal finding of lung field: Secondary | ICD-10-CM

## 2018-07-17 MED ORDER — FUROSEMIDE 40 MG PO TABS: 60.0000 mg | ORAL_TABLET | Freq: Every day | ORAL | 3 refills | Status: DC

## 2018-07-17 NOTE — Progress Notes (Signed)
Subjective:    Patient ID: [name], male    DOB: [birth year], 81 y.o.   MRN: [id number]  COPD  His past medical history is significant for COPD.   ROV 12/29/17 --patient follows up today for his history of tobacco use and moderate to severe obstruction/COPD with associated hypoxemic respiratory failure.  He has a history of mitral valve repair, atrial fibrillation (maze), coronary disease with secondary pulmonary hypertension.  He has been on chronic prednisone long-term, currently on 20 mg daily. He is having more nasal congestion since last time. Also some increased LE edema for the last month, started glipizide at that time. Minimal wheeze, occasional cough happens  every day, productive of yellow to dark, thick. He is on [employer]. He uses a neb every morning, I think it is budesonide - he can't tell me. He believes that he has had nmore LE edema since last time. Has increased his lasix for last couple days. He is scheduled to see Dr Stanford Breed next week.   ROV 05/28/18 --82 year old man with severe COPD, associated hypoxemic respiratory failure, history of atrial fibrillation (maze), coronary disease, mitral valve repair.  He has been on oxygen and chronic prednisone dependent.    He was treated for a left lower lobe pneumonia due to a rounded infiltrate noted on CT chest 03/04/2018.  A follow-up CT from 04/24/2018 showed interval increase in size of the left lower lobe consolidated irregular area.  This prompted a PET scan that was done on 05/16/2018 which I have reviewed.  There was a slight decrease in size in the left lower lobe opacity with mild FDG uptake, most consistent with a resolving inflammatory process as opposed to malignancy.  A 4 mm right upper lobe nodule was stable in size.  He saw Dr. Servando Snare to review.  A biopsy was deferred and the plan was made to follow his imaging, next in 3 months.   He was back in the ED 8/31 with acute dyspnea, was treated with continuous nebs. No real  evidence pulm edema.   Remains on stiolto, pred 20mg  daily. He is using albuterol nebs 4-5x a day. He is coughing up yellow mucous intermittently. No fevers. He is confused about the regimen - wife answers the questions. His o2 is usually on 3L/min. He has seen some desats to mid-80's at times w exertion.   ROV 07/17/2018 --this is a follow-up visit for 82 year old gentleman with severe COPD and chronic hypoxemic respiratory failure.  He also has atrial fibrillation, coronary disease history of mitral valve repair.  He is on chronic prednisone, currently 30 mg daily.  He has a left lower lobe opacity that we have followed with CT scans and then PET scan 05/16/2018, stable in size with mild hypermetabolism.  It was at his last visit that we increase to prednisone 30 mg.  He returns today reporting that his breathing and energy are improved. He still is quite limited, is basically wheelchair-bound. He denies any pain, has a stable cough prod of thick mucous. He is on pulmicort nebs, stiolto. Uses albuterol 5-6x a day. His o2 is at 3L/min at all times. He has some edema, is having his lasix adjusted by Dr Stanford Breed.    No flowsheet data found.  Review of Systems As per HPI     Objective:   Physical Exam Vitals:   07/17/18 1129  BP: 134/72  Pulse: 85  SpO2: 96%  Weight: 189 lb (85.7 kg)  Height: 5\' 8"  (1.727 m)  Gen: Pleasant, elderly gentleman, in no distress,  normal affect on 3 L/m pulse flow  ENT: No lesions,  mouth clear,  oropharynx clear, no postnasal drip  Neck: No JVD, no stridor  Lungs: No use of accessory muscles, Distant but clear.  No wheezing, no crackles  Cardiovascular: distant, irregular, no murmur or gallops, no peripheral edema  Musculoskeletal: No deformities, no cyanosis or clubbing  Neuro: alert, non focal  Skin: Warm, bruising on both deltoids      Assessment & Plan:  COPD (chronic obstructive pulmonary disease) with emphysema ([place]) He is clinically  improved since we went up on the prednisone.  As mentioned previously I suspect that he is approaching palliative care.  I'd like to continue him on the current regimen, prednisone 30 mg and his same bronchodilators.  We will plan to continue prednisone 30 mg daily as you have been taking it. Please continue Stiolto 2 puffs once daily Please continue your Pulmicort and albuterol nebulizers as you have been using them. Keep your albuterol inhaler available to use 2 puffs up to every 4 hours if needed for shortness of breath, wheezing, chest tightness. Continue your oxygen at 3 L/min at all times. Flu shot up-to-date. Pneumonia shots up-to-date  Mass of lower lobe of left lung We will repeat his CT scan of the chest in December to look for interval change.  He is a poor candidate for intervention, may be a candidate for palliative treatment depending on how this progresses.  We will follow-up to discuss the results and plan next steps after the scan is done.  Baltazar Apo, MD, PhD 07/17/2018, 11:59 AM [facility] [phone] or if no answer [phone]

## 2018-07-17 NOTE — Assessment & Plan Note (Signed)
He is clinically improved since we went up on the prednisone.  As mentioned previously I suspect that he is approaching palliative care.  I'd like to continue him on the current regimen, prednisone 30 mg and his same bronchodilators.  We will plan to continue prednisone 30 mg daily as you have been taking it. Please continue Stiolto 2 puffs once daily Please continue your Pulmicort and albuterol nebulizers as you have been using them. Keep your albuterol inhaler available to use 2 puffs up to every 4 hours if needed for shortness of breath, wheezing, chest tightness. Continue your oxygen at 3 L/min at all times. Flu shot up-to-date. Pneumonia shots up-to-date

## 2018-07-17 NOTE — Patient Instructions (Signed)
We will plan to continue prednisone 30 mg daily as you have been taking it. Please continue Stiolto 2 puffs once daily Please continue your Pulmicort and albuterol nebulizers as you have been using them. Keep your albuterol inhaler available to use 2 puffs up to every 4 hours if needed for shortness of breath, wheezing, chest tightness. Continue your oxygen at 3 L/min at all times. Flu shot up-to-date. Pneumonia shots up-to-date We will plan to repeat your CT scan of the chest in December to follow your left lower lobe. Follow with Dr. Lamonte Sakai in December after your CT scan to review the results together.

## 2018-07-17 NOTE — Assessment & Plan Note (Signed)
We will repeat his CT scan of the chest in December to look for interval change.  He is a poor candidate for intervention, may be a candidate for palliative treatment depending on how this progresses.  We will follow-up to discuss the results and plan next steps after the scan is done.

## 2018-07-17 NOTE — Patient Instructions (Signed)
Medication Instructions:  INCREASE FUROSEMIDE TO 80 MG ONCE DAILY FOR THE NEXT 2 DAYS THEN DECREASE TO 60 MG ONCE DAILY= 2 OF THE 40 MG TABLETS ONCE DAILY FOR 2 DAYS THEN 1 AND 1/2 OF THE 40 MG TABLET ONCE DAILY If you need a refill on your cardiac medications before your next appointment, please call your pharmacy.   Lab work: Your physician recommends that you return for lab work in: [place] If you have labs (blood work) drawn today and your tests are completely normal, you will receive your results only by: Marland Kitchen MyChart Message (if you have MyChart) OR . A paper copy in the mail If you have any lab test that is abnormal or we need to change your treatment, we will call you to review the results.   Follow-Up: Your physician recommends that you schedule a follow-up appointment in: [place] physician recommends that you schedule a follow-up appointment in: [place]

## 2018-07-18 NOTE — Addendum Note (Signed)
Addended by: Alvina Filbert B on: 07/18/2018 04:27 PM   Modules accepted: Orders

## 2018-07-22 ENCOUNTER — Encounter: Payer: Self-pay | Admitting: Family Medicine

## 2018-07-23 ENCOUNTER — Other Ambulatory Visit: Payer: Self-pay | Admitting: Emergency Medicine

## 2018-07-23 DIAGNOSIS — J449 Chronic obstructive pulmonary disease, unspecified: Secondary | ICD-10-CM

## 2018-07-23 DIAGNOSIS — J9611 Chronic respiratory failure with hypoxia: Secondary | ICD-10-CM

## 2018-07-24 ENCOUNTER — Encounter: Payer: Self-pay | Admitting: Family Medicine

## 2018-07-24 ENCOUNTER — Ambulatory Visit (INDEPENDENT_AMBULATORY_CARE_PROVIDER_SITE_OTHER): Payer: Medicare Other | Admitting: Family Medicine

## 2018-07-24 VITALS — BP 126/73 | HR 74 | Resp 16 | Ht 68.0 in | Wt 185.0 lb

## 2018-07-24 DIAGNOSIS — I2510 Atherosclerotic heart disease of native coronary artery without angina pectoris: Secondary | ICD-10-CM

## 2018-07-24 DIAGNOSIS — J9611 Chronic respiratory failure with hypoxia: Secondary | ICD-10-CM | POA: Diagnosis not present

## 2018-07-24 DIAGNOSIS — E118 Type 2 diabetes mellitus with unspecified complications: Secondary | ICD-10-CM

## 2018-07-24 DIAGNOSIS — I5032 Chronic diastolic (congestive) heart failure: Secondary | ICD-10-CM | POA: Diagnosis not present

## 2018-07-24 DIAGNOSIS — J449 Chronic obstructive pulmonary disease, unspecified: Secondary | ICD-10-CM | POA: Diagnosis not present

## 2018-07-24 LAB — BASIC METABOLIC PANEL
BUN: 19 mg/dL (ref 6–23)
CO2: 32 meq/L (ref 19–32)
Calcium: 9.1 mg/dL (ref 8.4–10.5)
Chloride: 98 mEq/L (ref 96–112)
Creatinine, Ser: 1.09 mg/dL (ref 0.40–1.50)
GFR: 69.07 mL/min (ref >60.00)
Glucose, Bld: 149 mg/dL — ABNORMAL HIGH (ref 70–99)
POTASSIUM: 3.6 meq/L (ref 3.5–5.1)
SODIUM: 138 meq/L (ref 135–145)

## 2018-07-24 LAB — HEMOGLOBIN A1C: HEMOGLOBIN A1C: 8.8 % — AB (ref 4.6–6.5)

## 2018-07-24 NOTE — Progress Notes (Signed)
OFFICE VISIT  07/24/2018   CC:  Chief Complaint  Patient presents with  . Follow-up    RCI   HPI:    Patient is a 82 y.o. Caucasian male who presents for 3 mom f/u DM, chronic hypoxic resp failure from COPD and chronic diastolic HF. Is on rate control and coumadin for his a fib.  He saw his cardiologist 07/17/18 and his lasix was increased to 80mg  qd for 2 days, then he was to start a daily dose of 60mg  (was on 40mg  qd)--needs recheck of BMET today. Ankle swelling has gone downlast few days. He continues to have no SOB at rest but with even a couple of steps he starts to get SOB.  DM: taking glipizide xl 5mg  qAM. Last glucose check was 4 d/a and it was 98.  L ear bothering him last few days, but stopped bothering him today. Says he got a "tube" put in L TM a few weeks ago by Dr. Janace Hoard.  Past Medical History:  Diagnosis Date  . Adrenal adenoma   . Atrial fibrillation (HCC)    Rate control + Coumadin  . Atrial flutter ([place])   . CAD (coronary artery disease)    Lexiscan Myoview (01/2014): Lateral soft tissue attenuation, no ischemia, not gated, low risk  . Cancer ([place])   . CAP (community acquired pneumonia)    Hospitalized 10/2014  . Chronic diastolic heart failure ([place])   . Chronic hypoxemic respiratory failure (HCC)    from COPD  . Chronic renal insufficiency, stage III (moderate) (HCC)    CrCl 50s  . COPD (chronic obstructive pulmonary disease) (HCC)    oxygen-dependent (2.5 L 24/7).  Improved on starting chronic prednisone 06/2017 (increased to 20 mg qd 08/2017)  Progressive worsening dyspnea through 2019: 3 L oxygen, 30mg  prednisone qd chronically as of 07/2018.  . Diabetes mellitus without complication ([place]) 86/76/1948   A1c 6.5 % 05/2016  . Diverticulosis   . DOE (dyspnea on exertion)    Multifactorial: deconditioning, COPD, obesity hypoventilation syndrome.  Marland Kitchen Dyslipidemia   . Embolic stroke ([place])    d/t endocarditis 2008  . Hemorrhoids   . History of mitral  valve replacement    needs SBE prophylaxis  . History of prostate cancer remote past   f/u by Dr. Claris Che; no recurrence as of f/u 10/2015.  PSA rising from 2013 to 04/2018 (PSA 04/25/18=18.49): urol to rpt PSA and check CT and bone scan  approx 10/2018.  Marland Kitchen HTN (hypertension)   . Left lower lobe pulmonary nodule July/Aug 2019.   04/2018: enlarging irregular nodular opacity.  CT surgery-->PET scan: inflamm/post-infect vs malignant. Plan f/u CT chest 3 mo per CT surg.  . Mediastinal lymphadenopathy   . Pulmonary hypertension ([place]) 01/09/2015   secondary  . Right thyroid nodule   . Thrombocytopenia ([place])   . TIA (transient ischemic attack) 03/25/2011   ? of due to transient diplopia  . Ulcerative colitis    left-sided/segmental, associated with diverticulosis.  Sulfasalazine per GI (Dr. Carlean Purl).  . Warthin's tumor     Past Surgical History:  Procedure Laterality Date  . carotid duplex dopplers  04/2014   No signif plaques; repeat 2 yrs per cardiology  . CATARACT EXTRACTION, BILATERAL    . COLONOSCOPY W/ BIOPSIES  12/19/2007   left-sided colitis, diverticulosis, hemorrhoids  . FLEXIBLE SIGMOIDOSCOPY  01/11/2008; 03/19/2010   2009 and 2011:segmental left colitis, diverticulosis, hemorrhoids  . INGUINAL HERNIA REPAIR     right  . MITRAL VALVE REPAIR  09/22/04   and modified Cox-Maze IV   . MITRAL VALVE REPAIR  09/14/05   redo MV repair d/t endocardiits/embolic events  . PROSTATECTOMY  06/2003   Radical  . RIGHT HEART CATHETERIZATION N/A 01/01/2015   Procedure: RIGHT HEART CATH;  Surgeon: Peter M Martinique, MD;  Location[facility] C[facility] LAB;  Service: Cardiovascular;  Laterality: N/A;  . SALIVARY GLAND SURGERY     left resection  . TRANSTHORACIC ECHOCARDIOGRAM  11/2013; 07/2017   2015: Mod LVH, EF 55-60%, no WM abnormalities, +LA dilation, +severe RV dilation and impaired systolic fxn, +increased pulm art pressures.  Valves ok.  2018- EF 60-65%, wall motion nl, s/p MV repair, mild RV syst dysf.      Outpatient Medications Prior to Visit  Medication Sig Dispense Refill  . albuterol (PROVENTIL HFA;VENTOLIN HFA) 108 (90 Base) MCG/ACT inhaler INHALE ONE PUFF BY MOUTH EVERY 6 HOURS AS NEEDED FOR WHEEZING AND FOR SHORTNESS OF BREATH (USING 5-6 TIMES EVERY 24 HOURS) 1 Inhaler 5  . albuterol (PROVENTIL) (2.5 MG/3ML) 0.083% nebulizer solution Take 3 mLs (2.5 mg total) by nebulization every 4 (four) hours. And as needed 300 mL 5  . budesonide (PULMICORT) 0.5 MG/2ML nebulizer solution Take 2 mLs (0.5 mg total) by nebulization 2 (two) times daily. 120 mL 5  . budesonide (PULMICORT) 0.5 MG/2ML nebulizer solution USE 1 VIAL IN NEBULIZER TWICE DAILY 120 mL 5  . digoxin (LANOXIN) 0.125 MG tablet Take 1 tablet (0.125 mg total) by mouth daily. 90 tablet 3  . diltiazem (CARDIZEM CD) 240 MG 24 hr capsule Take 1 capsule (240 mg total) by mouth daily. 90 capsule 3  . furosemide (LASIX) 40 MG tablet Take 1.5 tablets (60 mg total) by mouth daily. 135 tablet 3  . glipiZIDE (GLUCOTROL XL) 5 MG 24 hr tablet Take 1 tablet (5 mg total) by mouth daily with breakfast. 90 tablet 1  . LORazepam (ATIVAN) 0.5 MG tablet Take 1 tablet by mouth daily as needed.    . pravastatin (PRAVACHOL) 40 MG tablet TAKE 1 TABLET EVERY EVENING (Patient taking differently: Take 40 mg by mouth daily. TAKE 1 TABLET EVERY EVENING) 90 tablet 3  . STIOLTO RESPIMAT 2.5-2.5 MCG/ACT AERS INHALE 2 PUFFS INTO THE LUNGS DAILY. 12 g 3  . warfarin (COUMADIN) 5 MG tablet Take 1/2 to 1 tablet by mouth daily as directed 90 tablet 1  . predniSONE (DELTASONE) 20 MG tablet Take 1 tablet (20 mg total) by mouth daily with breakfast. (Patient taking differently: Take 30 mg by mouth daily with breakfast. ) 90 tablet 1   No facility-administered medications prior to visit.     No Known Allergies  ROS As per HPI  PE: Blood pressure 126/73, pulse 74, resp. rate 16, height 5\' 8"  (1.727 m), weight 185 lb (83.9 kg), SpO2 91 %.3 L oxygen Gen: Alert, tired  appearing, sitting in WC.  NAD.  Patient is oriented to person, place, time, and situation. L ear with moderate amount of cerumen, only top 1/2 of TM visualized and it looks normal.  I cleared some cerumen and this made his ear feel better and I could see a blue tympanostomy tube in TM--no prob.  CV: irreg irreg, no m/r.  Distant S1 and S2. Chest is clear, no wheezing or rales. Normal symmetric air entry throughout both lung fields. No chest wall deformities or tenderness. EXT; 1+ pitting edema R LL, no edema in L LL.No c/c.  LABS:  Lab Results  Component Value Date   TSH  3.24 12/09/2014   Lab Results  Component Value Date   WBC 9.6 05/19/2018   HGB 13.2 05/19/2018   HCT 41.3 05/19/2018   MCV 87.9 05/19/2018   PLT 142 (L) 05/19/2018   Lab Results  Component Value Date   CREATININE 1.24 05/19/2018   BUN 11 05/19/2018   NA 137 05/19/2018   K 3.3 (L) 05/19/2018   CL 101 05/19/2018   CO2 27 05/19/2018   Lab Results  Component Value Date   ALT 18 05/19/2018   AST 16 05/19/2018   ALKPHOS 45 05/19/2018   BILITOT 1.3 (H) 05/19/2018   Lab Results  Component Value Date   CHOL 159 10/16/2017   Lab Results  Component Value Date   HDL 46.50 10/16/2017   Lab Results  Component Value Date   LDLCALC 79 10/12/2016   Lab Results  Component Value Date   TRIG 224.0 (H) 10/16/2017   Lab Results  Component Value Date   CHOLHDL 3 10/16/2017   Lab Results  Component Value Date   HGBA1C 8.7 (H) 04/24/2018     IMPRESSION AND PLAN:  1) DM 2: not much home monitoring going on. BMET and A1ctoday.  2) Chronic hypoxic resp failure:  COPD stable. Fluid balance has improved since Dr. Stanford Breed increased his lasix a week ago. Will continue him on lasix 60 mg qd and check BMET today-->will forward lab results to Dr. Stanford Breed.  An After Visit Summary was printed and given to the patient.  FOLLOW UP: Return in about 3 months (around 10/24/2018) for routine chronic illness  f/u.  Signed:  Crissie Sickles, MD           07/24/2018

## 2018-07-25 ENCOUNTER — Other Ambulatory Visit: Payer: Self-pay | Admitting: Family Medicine

## 2018-07-25 ENCOUNTER — Ambulatory Visit (INDEPENDENT_AMBULATORY_CARE_PROVIDER_SITE_OTHER): Payer: Medicare Other | Admitting: *Deleted

## 2018-07-25 DIAGNOSIS — Z5181 Encounter for therapeutic drug level monitoring: Secondary | ICD-10-CM

## 2018-07-25 DIAGNOSIS — I4891 Unspecified atrial fibrillation: Secondary | ICD-10-CM

## 2018-07-25 LAB — POCT INR: INR: 2.4 (ref 2.0–3.0)

## 2018-07-25 MED ORDER — GLIPIZIDE ER 10 MG PO TB24: 10.0000 mg | ORAL_TABLET | Freq: Every day | ORAL | 1 refills | Status: AC

## 2018-07-25 NOTE — Patient Instructions (Signed)
Description   Continue same dosage 1 tablet daily except 1/2 tablet on Mondays, Wednesdays, and Fridays. Increasing Prednisone 30 mg daily 05/28/17 as maintenance dose. Recheck INR in 4 weeks.  Call with new medications or any changes [phone].

## 2018-07-26 ENCOUNTER — Telehealth: Payer: Self-pay | Admitting: Emergency Medicine

## 2018-07-26 NOTE — Telephone Encounter (Signed)
Called and spoke with patient's wife let her know order for 90 day supply was sent to [facility] (pharmacy of choice) on 07/24/2018. I told patient to contact Mcarthur Rossetti is she wanted to. And If she does not hear from [facility] by Monday to call us back.  Nothing further needed.

## 2018-08-14 ENCOUNTER — Inpatient Hospital Stay (HOSPITAL_COMMUNITY)
Admission: EM | Admit: 2018-08-14 | Discharge: 2018-08-20 | DRG: 291 | Disposition: A | Discharge status: Home Health | Payer: Medicare Other | Attending: Internal Medicine | Admitting: Internal Medicine

## 2018-08-14 ENCOUNTER — Emergency Department (HOSPITAL_COMMUNITY): Payer: Medicare Other

## 2018-08-14 ENCOUNTER — Encounter (HOSPITAL_COMMUNITY): Payer: Self-pay | Admitting: Emergency Medicine

## 2018-08-14 ENCOUNTER — Encounter: Payer: Self-pay | Admitting: Cardiology

## 2018-08-14 ENCOUNTER — Ambulatory Visit (INDEPENDENT_AMBULATORY_CARE_PROVIDER_SITE_OTHER): Payer: Medicare Other | Admitting: Pharmacist Clinician (PhC)/ Clinical Pharmacy Specialist

## 2018-08-14 ENCOUNTER — Ambulatory Visit (INDEPENDENT_AMBULATORY_CARE_PROVIDER_SITE_OTHER): Payer: Medicare Other | Admitting: Cardiology

## 2018-08-14 ENCOUNTER — Other Ambulatory Visit: Payer: Self-pay

## 2018-08-14 VITALS — BP 131/66 | HR 80 | Ht 68.0 in | Wt 186.0 lb

## 2018-08-14 DIAGNOSIS — Z9842 Cataract extraction status, left eye: Secondary | ICD-10-CM

## 2018-08-14 DIAGNOSIS — E662 Morbid (severe) obesity with alveolar hypoventilation: Secondary | ICD-10-CM | POA: Diagnosis not present

## 2018-08-14 DIAGNOSIS — N183 Chronic kidney disease, stage 3 unspecified: Secondary | ICD-10-CM

## 2018-08-14 DIAGNOSIS — Z7901 Long term (current) use of anticoagulants: Secondary | ICD-10-CM | POA: Diagnosis not present

## 2018-08-14 DIAGNOSIS — D696 Thrombocytopenia, unspecified: Secondary | ICD-10-CM | POA: Diagnosis present

## 2018-08-14 DIAGNOSIS — I272 Pulmonary hypertension, unspecified: Secondary | ICD-10-CM | POA: Diagnosis present

## 2018-08-14 DIAGNOSIS — Z66 Do not resuscitate: Secondary | ICD-10-CM | POA: Diagnosis present

## 2018-08-14 DIAGNOSIS — R000 Tachycardia, unspecified: Secondary | ICD-10-CM | POA: Diagnosis not present

## 2018-08-14 DIAGNOSIS — E1122 Type 2 diabetes mellitus with diabetic chronic kidney disease: Secondary | ICD-10-CM | POA: Diagnosis present

## 2018-08-14 DIAGNOSIS — R069 Unspecified abnormalities of breathing: Secondary | ICD-10-CM | POA: Diagnosis not present

## 2018-08-14 DIAGNOSIS — I4891 Unspecified atrial fibrillation: Secondary | ICD-10-CM | POA: Diagnosis present

## 2018-08-14 DIAGNOSIS — I2510 Atherosclerotic heart disease of native coronary artery without angina pectoris: Secondary | ICD-10-CM

## 2018-08-14 DIAGNOSIS — Z5181 Encounter for therapeutic drug level monitoring: Secondary | ICD-10-CM

## 2018-08-14 DIAGNOSIS — J181 Lobar pneumonia, unspecified organism: Secondary | ICD-10-CM | POA: Diagnosis present

## 2018-08-14 DIAGNOSIS — J9611 Chronic respiratory failure with hypoxia: Secondary | ICD-10-CM

## 2018-08-14 DIAGNOSIS — Z9981 Dependence on supplemental oxygen: Secondary | ICD-10-CM

## 2018-08-14 DIAGNOSIS — Z823 Family history of stroke: Secondary | ICD-10-CM

## 2018-08-14 DIAGNOSIS — D72829 Elevated white blood cell count, unspecified: Secondary | ICD-10-CM

## 2018-08-14 DIAGNOSIS — K5790 Diverticulosis of intestine, part unspecified, without perforation or abscess without bleeding: Secondary | ICD-10-CM | POA: Diagnosis present

## 2018-08-14 DIAGNOSIS — Z7951 Long term (current) use of inhaled steroids: Secondary | ICD-10-CM

## 2018-08-14 DIAGNOSIS — I5043 Acute on chronic combined systolic (congestive) and diastolic (congestive) heart failure: Secondary | ICD-10-CM | POA: Diagnosis not present

## 2018-08-14 DIAGNOSIS — Z9889 Other specified postprocedural states: Secondary | ICD-10-CM

## 2018-08-14 DIAGNOSIS — J439 Emphysema, unspecified: Secondary | ICD-10-CM | POA: Diagnosis present

## 2018-08-14 DIAGNOSIS — J438 Other emphysema: Secondary | ICD-10-CM

## 2018-08-14 DIAGNOSIS — R0902 Hypoxemia: Secondary | ICD-10-CM | POA: Diagnosis not present

## 2018-08-14 DIAGNOSIS — I509 Heart failure, unspecified: Secondary | ICD-10-CM | POA: Diagnosis not present

## 2018-08-14 DIAGNOSIS — Z952 Presence of prosthetic heart valve: Secondary | ICD-10-CM

## 2018-08-14 DIAGNOSIS — R0602 Shortness of breath: Secondary | ICD-10-CM | POA: Diagnosis not present

## 2018-08-14 DIAGNOSIS — E1165 Type 2 diabetes mellitus with hyperglycemia: Secondary | ICD-10-CM

## 2018-08-14 DIAGNOSIS — Z809 Family history of malignant neoplasm, unspecified: Secondary | ICD-10-CM

## 2018-08-14 DIAGNOSIS — I130 Hypertensive heart and chronic kidney disease with heart failure and stage 1 through stage 4 chronic kidney disease, or unspecified chronic kidney disease: Principal | ICD-10-CM | POA: Diagnosis present

## 2018-08-14 DIAGNOSIS — Z87891 Personal history of nicotine dependence: Secondary | ICD-10-CM

## 2018-08-14 DIAGNOSIS — Z8673 Personal history of transient ischemic attack (TIA), and cerebral infarction without residual deficits: Secondary | ICD-10-CM

## 2018-08-14 DIAGNOSIS — Z8249 Family history of ischemic heart disease and other diseases of the circulatory system: Secondary | ICD-10-CM

## 2018-08-14 DIAGNOSIS — Z7952 Long term (current) use of systemic steroids: Secondary | ICD-10-CM

## 2018-08-14 DIAGNOSIS — Z7984 Long term (current) use of oral hypoglycemic drugs: Secondary | ICD-10-CM

## 2018-08-14 DIAGNOSIS — J432 Centrilobular emphysema: Secondary | ICD-10-CM | POA: Diagnosis present

## 2018-08-14 DIAGNOSIS — Z9841 Cataract extraction status, right eye: Secondary | ICD-10-CM

## 2018-08-14 DIAGNOSIS — E785 Hyperlipidemia, unspecified: Secondary | ICD-10-CM

## 2018-08-14 DIAGNOSIS — R042 Hemoptysis: Secondary | ICD-10-CM | POA: Diagnosis present

## 2018-08-14 DIAGNOSIS — E119 Type 2 diabetes mellitus without complications: Secondary | ICD-10-CM

## 2018-08-14 DIAGNOSIS — I110 Hypertensive heart disease with heart failure: Secondary | ICD-10-CM | POA: Diagnosis not present

## 2018-08-14 DIAGNOSIS — I4819 Other persistent atrial fibrillation: Secondary | ICD-10-CM | POA: Diagnosis not present

## 2018-08-14 DIAGNOSIS — J9621 Acute and chronic respiratory failure with hypoxia: Secondary | ICD-10-CM | POA: Diagnosis present

## 2018-08-14 DIAGNOSIS — R457 State of emotional shock and stress, unspecified: Secondary | ICD-10-CM | POA: Diagnosis not present

## 2018-08-14 DIAGNOSIS — I10 Essential (primary) hypertension: Secondary | ICD-10-CM | POA: Diagnosis present

## 2018-08-14 DIAGNOSIS — Z8546 Personal history of malignant neoplasm of prostate: Secondary | ICD-10-CM

## 2018-08-14 DIAGNOSIS — I480 Paroxysmal atrial fibrillation: Secondary | ICD-10-CM | POA: Diagnosis not present

## 2018-08-14 DIAGNOSIS — R0689 Other abnormalities of breathing: Secondary | ICD-10-CM | POA: Diagnosis not present

## 2018-08-14 DIAGNOSIS — E876 Hypokalemia: Secondary | ICD-10-CM | POA: Diagnosis present

## 2018-08-14 LAB — CBC WITH DIFFERENTIAL/PLATELET
ABS IMMATURE GRANULOCYTES: 0.39 10*3/uL — AB (ref 0.00–0.07)
BASOS ABS: 0.1 10*3/uL (ref 0.0–0.1)
Basophils Relative: 1 %
EOS ABS: 0 10*3/uL (ref 0.0–0.5)
EOS PCT: 0 %
HCT: 44.1 % (ref 39.0–52.0)
HEMOGLOBIN: 13.5 g/dL (ref 13.0–17.0)
IMMATURE GRANULOCYTES: 3 %
LYMPHS ABS: 1.4 10*3/uL (ref 0.7–4.0)
LYMPHS PCT: 10 %
MCH: 27.2 pg (ref 26.0–34.0)
MCHC: 30.6 g/dL (ref 30.0–36.0)
MCV: 88.7 fL (ref 80.0–100.0)
MONOS PCT: 5 %
Monocytes Absolute: 0.8 10*3/uL (ref 0.1–1.0)
Neutro Abs: 11.4 10*3/uL — ABNORMAL HIGH (ref 1.7–7.7)
Neutrophils Relative %: 81 %
PLATELETS: 158 10*3/uL (ref 150–400)
RBC: 4.97 MIL/uL (ref 4.22–5.81)
RDW: 15.9 % — ABNORMAL HIGH (ref 11.5–15.5)
WBC: 14 10*3/uL — ABNORMAL HIGH (ref 4.0–10.5)
nRBC: 0.1 % (ref 0.0–0.2)

## 2018-08-14 LAB — BASIC METABOLIC PANEL
ANION GAP: 10 (ref 5–15)
BUN: 17 mg/dL (ref 8–23)
CHLORIDE: 99 mmol/L (ref 98–111)
CO2: 31 mmol/L (ref 22–32)
Calcium: 9.2 mg/dL (ref 8.9–10.3)
Creatinine, Ser: 1.38 mg/dL — ABNORMAL HIGH (ref 0.61–1.24)
GFR calc Af Amer: 56 mL/min — ABNORMAL LOW (ref >60)
GFR, EST NON AFRICAN AMERICAN: 48 mL/min — AB (ref >60)
GLUCOSE: 259 mg/dL — AB (ref 70–99)
POTASSIUM: 3.7 mmol/L (ref 3.5–5.1)
SODIUM: 140 mmol/L (ref 135–145)

## 2018-08-14 LAB — POCT INR: INR: 0.9 — AB (ref 2.0–3.0)

## 2018-08-14 LAB — BRAIN NATRIURETIC PEPTIDE: B NATRIURETIC PEPTIDE 5: 98.8 pg/mL (ref 0.0–100.0)

## 2018-08-14 MED ORDER — FUROSEMIDE 10 MG/ML IJ SOLN
60.0000 mg | Freq: Two times a day (BID) | INTRAMUSCULAR | Status: DC
  Administered 2018-08-14 – 2018-08-15 (×3): 60 mg via INTRAVENOUS
  Filled 2018-08-14 (×3): qty 6

## 2018-08-14 MED ORDER — BUDESONIDE 0.5 MG/2ML IN SUSP
0.5000 mg | Freq: Two times a day (BID) | RESPIRATORY_TRACT | Status: DC
  Administered 2018-08-15 – 2018-08-20 (×11): 0.5 mg via RESPIRATORY_TRACT
  Filled 2018-08-14 (×11): qty 2

## 2018-08-14 MED ORDER — DILTIAZEM HCL ER COATED BEADS 240 MG PO CP24
240.0000 mg | ORAL_CAPSULE | Freq: Every day | ORAL | Status: DC
  Administered 2018-08-15 – 2018-08-20 (×6): 240 mg via ORAL
  Filled 2018-08-14 (×6): qty 1

## 2018-08-14 MED ORDER — INSULIN ASPART 100 UNIT/ML ~~LOC~~ SOLN
0.0000 [IU] | Freq: Three times a day (TID) | SUBCUTANEOUS | Status: DC
  Administered 2018-08-15: 5 [IU] via SUBCUTANEOUS
  Administered 2018-08-15: 7 [IU] via SUBCUTANEOUS
  Administered 2018-08-15: 2 [IU] via SUBCUTANEOUS
  Administered 2018-08-16: 3 [IU] via SUBCUTANEOUS
  Administered 2018-08-16: 2 [IU] via SUBCUTANEOUS

## 2018-08-14 MED ORDER — ALBUTEROL SULFATE (2.5 MG/3ML) 0.083% IN NEBU
2.5000 mg | INHALATION_SOLUTION | RESPIRATORY_TRACT | Status: DC
  Administered 2018-08-15 (×5): 2.5 mg via RESPIRATORY_TRACT
  Filled 2018-08-14 (×5): qty 3

## 2018-08-14 MED ORDER — PRAVASTATIN SODIUM 40 MG PO TABS
40.0000 mg | ORAL_TABLET | Freq: Every day | ORAL | Status: DC
  Administered 2018-08-15 – 2018-08-19 (×5): 40 mg via ORAL
  Filled 2018-08-14 (×5): qty 1

## 2018-08-14 MED ORDER — LORAZEPAM 0.5 MG PO TABS
0.5000 mg | ORAL_TABLET | Freq: Every day | ORAL | Status: DC | PRN
  Administered 2018-08-15 – 2018-08-20 (×5): 0.5 mg via ORAL
  Filled 2018-08-14 (×5): qty 1

## 2018-08-14 MED ORDER — UMECLIDINIUM BROMIDE 62.5 MCG/INH IN AEPB
1.0000 | INHALATION_SPRAY | Freq: Every day | RESPIRATORY_TRACT | Status: DC
  Administered 2018-08-15 – 2018-08-20 (×6): 1 via RESPIRATORY_TRACT
  Filled 2018-08-14: qty 7

## 2018-08-14 MED ORDER — PREDNISONE 20 MG PO TABS
30.0000 mg | ORAL_TABLET | Freq: Every day | ORAL | Status: DC
  Administered 2018-08-15 – 2018-08-20 (×6): 30 mg via ORAL
  Filled 2018-08-14 (×6): qty 1

## 2018-08-14 MED ORDER — ARFORMOTEROL TARTRATE 15 MCG/2ML IN NEBU
15.0000 ug | INHALATION_SOLUTION | Freq: Two times a day (BID) | RESPIRATORY_TRACT | Status: DC
  Administered 2018-08-15 – 2018-08-20 (×11): 15 ug via RESPIRATORY_TRACT
  Filled 2018-08-14 (×11): qty 2

## 2018-08-14 MED ORDER — ALBUTEROL SULFATE (2.5 MG/3ML) 0.083% IN NEBU
5.0000 mg | INHALATION_SOLUTION | Freq: Once | RESPIRATORY_TRACT | Status: AC
  Administered 2018-08-14: 5 mg via RESPIRATORY_TRACT
  Filled 2018-08-14: qty 6

## 2018-08-14 MED ORDER — DIGOXIN 125 MCG PO TABS
0.1250 mg | ORAL_TABLET | Freq: Every day | ORAL | Status: DC
  Administered 2018-08-15 – 2018-08-17 (×3): 0.125 mg via ORAL
  Filled 2018-08-14 (×3): qty 1

## 2018-08-14 MED ORDER — INSULIN GLARGINE 100 UNIT/ML ~~LOC~~ SOLN
5.0000 [IU] | Freq: Every day | SUBCUTANEOUS | Status: DC
  Administered 2018-08-15: 5 [IU] via SUBCUTANEOUS
  Filled 2018-08-14 (×2): qty 0.05

## 2018-08-14 NOTE — ED Notes (Signed)
Pt alert x 4 report given to Katie RN who will resume care. Pt will tx to room 1438. I will continue to monitor.

## 2018-08-14 NOTE — ED Provider Notes (Signed)
[facility] DEPT Provider Note   CSN: [id number] Arrival date & time: 08/14/18  1425     History   Chief Complaint Chief Complaint  Patient presents with  . Shortness of Breath    HPI [name] is a 82 y.o. male.  82 year old male with history of chronic dyspnea, CHF, atrial fib, diabetes, MVR, hypertension. Patient reports increasing shortness of breath recently. He was seen at his cardiology office and the coumadin clinic this morning. Upon returning home, patient states his shortness of breath worsened, especially with minimal exertion.  The history is provided by the patient and medical records. No language interpreter was used.  Shortness of Breath  This is a recurrent problem. The average episode lasts 4 hours. The problem occurs frequently.The current episode started 3 to 5 hours ago. The problem has been gradually improving. Associated symptoms include leg swelling. Pertinent negatives include no fever and no chest pain. Associated medical issues include COPD, chronic lung disease and heart failure.    Past Medical History:  Diagnosis Date  . Adrenal adenoma   . Atrial fibrillation (HCC)    Rate control + Coumadin  . Atrial flutter ([place])   . CAD (coronary artery disease)    Lexiscan Myoview (01/2014): Lateral soft tissue attenuation, no ischemia, not gated, low risk  . Cancer ([place])   . CAP (community acquired pneumonia)    Hospitalized 10/2014  . Chronic diastolic heart failure ([place])   . Chronic hypoxemic respiratory failure (HCC)    from COPD  . Chronic renal insufficiency, stage III (moderate) (HCC)    CrCl 50s  . COPD (chronic obstructive pulmonary disease) (HCC)    oxygen-dependent (2.5 L 24/7).  Improved on starting chronic prednisone 06/2017 (increased to 20 mg qd 08/2017)  Progressive worsening dyspnea through 2019: 3 L oxygen, 30mg  prednisone qd chronically as of 07/2018.  . Diabetes mellitus without complication ([place])  13/24/4010   A1c 6.5 % 05/2016  . Diverticulosis   . DOE (dyspnea on exertion)    Multifactorial: deconditioning, COPD, obesity hypoventilation syndrome.  Marland Kitchen Dyslipidemia   . Embolic stroke ([place])    d/t endocarditis 2008  . Hemorrhoids   . History of mitral valve replacement    needs SBE prophylaxis  . History of prostate cancer remote past   f/u by Dr. Claris Che; no recurrence as of f/u 10/2015.  PSA rising from 2013 to 04/2018 (PSA 04/25/18=18.49): urol to rpt PSA and check CT and bone scan  approx 10/2018.  Marland Kitchen HTN (hypertension)   . Left lower lobe pulmonary nodule July/Aug 2019.   04/2018: enlarging irregular nodular opacity.  CT surgery-->PET scan: inflamm/post-infect vs malignant. Plan f/u CT chest 3 mo per CT surg.  . Mediastinal lymphadenopathy   . Pulmonary hypertension ([place]) 01/09/2015   secondary  . Right thyroid nodule   . Thrombocytopenia ([place])   . TIA (transient ischemic attack) 03/25/2011   ? of due to transient diplopia  . Ulcerative colitis    left-sided/segmental, associated with diverticulosis.  Sulfasalazine per GI (Dr. Carlean Purl).  . Warthin's tumor     Patient Active Problem List   Diagnosis Date Noted  . Mass of lower lobe of left lung 05/15/2018  . AKI (acute kidney injury) ([place]) 03/04/2018  . Community acquired pneumonia of left lower lobe of lung ([place]) 03/04/2018  . DM2 (diabetes mellitus, type 2) ([place]) 03/04/2018  . Acute on chronic respiratory failure with hypoxia ([place]) 07/18/2017  . Chronic congestive heart failure ([place])  07/18/2017  . Pruritus 09/02/2015  . Maxillary sinusitis 08/17/2015  . Eye irritation 08/17/2015  . Chronic hypoxemic respiratory failure ([place]) 07/17/2015  . Pulmonary hypertension ([place]) 01/09/2015  . Anxiety state 11/28/2014  . SOB (shortness of breath) 11/28/2014  . Cerebrovascular disease 11/20/2014  . Emotional lability 11/11/2014  . Tachycardia 11/04/2014  . Encounter for therapeutic drug monitoring 03/18/2014  . Atrial fibrillation  ([place]) 07/25/2011  . Long term current use of anticoagulant 10/20/2010  . Essential hypertension, benign 08/20/2009  . HYPERLIPIDEMIA, FAMILIAL 08/03/2009  . Coronary atherosclerosis 08/03/2009  . History of mitral valve repair 08/03/2009  . THYROID NODULE, RIGHT 12/03/2008  . CEREBROVASCULAR ACCIDENT, HX OF 12/03/2008  . COPD (chronic obstructive pulmonary disease) with emphysema ([place]) 06/09/2008  . Segmental colitis associated with sigmoid diverticulosis 12/12/2007  . PROSTATE CANCER, HX OF 01/12/2007  . ENDOCARDITIS 07/04/2006    Class: History of    Past Surgical History:  Procedure Laterality Date  . carotid duplex dopplers  04/2014   No signif plaques; repeat 2 yrs per cardiology  . CATARACT EXTRACTION, BILATERAL    . COLONOSCOPY W/ BIOPSIES  12/19/2007   left-sided colitis, diverticulosis, hemorrhoids  . FLEXIBLE SIGMOIDOSCOPY  01/11/2008; 03/19/2010   2009 and 2011:segmental left colitis, diverticulosis, hemorrhoids  . INGUINAL HERNIA REPAIR     right  . MITRAL VALVE REPAIR  09/22/04   and modified Cox-Maze IV   . MITRAL VALVE REPAIR  09/14/05   redo MV repair d/t endocardiits/embolic events  . PROSTATECTOMY  06/2003   Radical  . RIGHT HEART CATHETERIZATION N/A 01/01/2015   Procedure: RIGHT HEART CATH;  Surgeon: Peter M Martinique, MD;  Location: [facility] C[facility] LAB;  Service: Cardiovascular;  Laterality: N/A;  . SALIVARY GLAND SURGERY     left resection  . TRANSTHORACIC ECHOCARDIOGRAM  11/2013; 07/2017   2015: Mod LVH, EF 55-60%, no WM abnormalities, +LA dilation, +severe RV dilation and impaired systolic fxn, +increased pulm art pressures.  Valves ok.  2018- EF 60-65%, wall motion nl, s/p MV repair, mild RV syst dysf.         Home Medications    Prior to Admission medications   Medication Sig Start Date End Date Taking? Authorizing Provider  albuterol (PROVENTIL HFA;VENTOLIN HFA) 108 (90 Base) MCG/ACT inhaler INHALE ONE PUFF BY MOUTH EVERY 6 HOURS AS NEEDED FOR WHEEZING AND FOR  SHORTNESS OF BREATH (USING 5-6 TIMES EVERY 24 HOURS) 01/01/18   Collene Gobble, MD  albuterol (PROVENTIL) (2.5 MG/3ML) 0.083% nebulizer solution Take 3 mLs (2.5 mg total) by nebulization every 4 (four) hours. And as needed 12/29/17   Collene Gobble, MD  budesonide (PULMICORT) 0.5 MG/2ML nebulizer solution Take 2 mLs (0.5 mg total) by nebulization 2 (two) times daily. 05/28/18   Collene Gobble, MD  budesonide (PULMICORT) 0.5 MG/2ML nebulizer solution USE 1 VIAL IN NEBULIZER TWICE DAILY 06/25/18   Byrum, Rose Fillers, MD  digoxin (LANOXIN) 0.125 MG tablet Take 1 tablet (0.125 mg total) by mouth daily. 01/10/18   Lelon Perla, MD  diltiazem (CARDIZEM CD) 240 MG 24 hr capsule Take 1 capsule (240 mg total) by mouth daily. 01/10/18   Lelon Perla, MD  furosemide (LASIX) 40 MG tablet Take 1.5 tablets (60 mg total) by mouth daily. 07/17/18 08/16/18  Lelon Perla, MD  glipiZIDE (GLUCOTROL XL) 10 MG 24 hr tablet Take 1 tablet (10 mg total) by mouth daily with breakfast. 07/25/18   McGowen, Adrian Blackwater, MD  LORazepam (ATIVAN) 0.5 MG tablet Take  1 tablet by mouth daily as needed. 01/31/18   [provider]  pravastatin (PRAVACHOL) 40 MG tablet TAKE 1 TABLET EVERY EVENING Patient taking differently: Take 40 mg by mouth daily. TAKE 1 TABLET EVERY EVENING 01/10/18   Lelon Perla, MD  predniSONE (DELTASONE) 20 MG tablet Take 1.5 tablets (30 mg total) by mouth daily with breakfast. 07/24/18   Collene Gobble, MD  STIOLTO RESPIMAT 2.5-2.5 MCG/ACT AERS INHALE 2 PUFFS INTO THE LUNGS DAILY. 09/20/17   Collene Gobble, MD  warfarin (COUMADIN) 5 MG tablet Take 1/2 to 1 tablet by mouth daily as directed 06/26/18   Lelon Perla, MD    Family History Family History  Problem Relation Age of Onset  . Cancer Mother        spinal  . Stroke Father   . Heart disease Father   . Colon cancer Neg Hx   . Heart attack Neg Hx     Social History Social History   Tobacco Use  . Smoking status: Former Smoker      Packs/day: 3.00    Years: 60.00    Pack years: 180.00    Types: Cigarettes    Last attempt to quit: 09/19/2002    Years since quitting: 15.9  . Smokeless tobacco: Never Used  Substance Use Topics  . Alcohol use: No    Alcohol/week: 0.0 standard drinks  . Drug use: No     Allergies   Patient has no known allergies.   Review of Systems Review of Systems  Constitutional: Positive for activity change. Negative for fever.  Respiratory: Positive for shortness of breath.   Cardiovascular: Positive for leg swelling. Negative for chest pain.  All other systems reviewed and are negative.    Physical Exam Updated Vital Signs BP 132/73 (BP Location: Left Arm)   Pulse 90   Temp 98.4 F (36.9 C) (Oral)   Resp 18   SpO2 97%   Physical Exam  Constitutional: He is oriented to person, place, and time. He appears well-developed and well-nourished. No distress.  HENT:  Head: Atraumatic.  Eyes: Pupils are equal, round, and reactive to light.  Neck: Neck supple.  Cardiovascular: Intact distal pulses and normal pulses. An irregularly irregular rhythm present.  Trace lower extremity edema.   Pulmonary/Chest: Effort normal. He has rales in the left lower field.  Abdominal: Soft.  Musculoskeletal: He exhibits edema.  Neurological: He is alert and oriented to person, place, and time.  Skin: Skin is warm and dry.  Psychiatric: He has a normal mood and affect.  Nursing note and vitals reviewed.    ED Treatments / Results  Labs (all labs ordered are listed, but only abnormal results are displayed) Labs Reviewed  CBC WITH DIFFERENTIAL/PLATELET - Abnormal; Notable for the following components:      Result Value   WBC 14.0 (*)    RDW 15.9 (*)    Neutro Abs 11.4 (*)    Abs Immature Granulocytes 0.39 (*)    All other components within normal limits  BASIC METABOLIC PANEL - Abnormal; Notable for the following components:   Glucose, Bld 259 (*)    Creatinine, Ser 1.38 (*)    GFR calc  non Af Amer 48 (*)    GFR calc Af Amer 56 (*)    All other components within normal limits  BRAIN NATRIURETIC PEPTIDE    EKG EKG Interpretation  Date/Time:  Tuesday August 14 2018 15:21:23 EST Ventricular Rate:  91 PR Interval:  QRS Duration: 100 QT Interval:  330 QTC Calculation: 405 R Axis:   -173 Text Interpretation:  Possible  Atrial fibrillation with premature ventricular or aberrantly conducted complexes Right ventricular hypertrophy Lateral infarct , age undetermined Abnormal ECG Poor data quality atrial fibrillation is new since last tracing Confirmed by Dorie Rank [phone]) on 08/14/2018 3:40:54 PM   Radiology Dg Chest Portable 1 View  Result Date: 08/14/2018 CLINICAL DATA:  82 y/o M; shortness of breath, hypoxia, history of CABG. Former smoker. EXAM: PORTABLE CHEST 1 VIEW COMPARISON:  05/19/2018 chest radiograph. FINDINGS: Stable cardiomegaly given projection and technique. Post median sternotomy with wires in alignment. Mitral valve annuloplasty. Calcific aortic atherosclerosis. Diffuse reticular opacities of the lungs. Possible small left effusion. No acute osseous abnormality is evident. IMPRESSION: 1. Interstitial pulmonary edema and possible small left effusion. 2. Cardiomegaly.  Aortic Atherosclerosis (ICD10-I70.0). Electronically Signed   By: Kristine Garbe M.D.   On: 08/14/2018 15:46    Procedures Procedures (including critical care time)  Medications Ordered in ED Medications  albuterol (PROVENTIL) (2.5 MG/3ML) 0.083% nebulizer solution 5 mg (has no administration in time range)     Initial Impression / Assessment and Plan / ED Course  I have reviewed the triage vital signs and the nursing notes.  Pertinent labs & imaging results that were available during my care of the patient were reviewed by me and considered in my medical decision making (see chart for details).    Patient with episodes of desaturation while in bed. Increase need for  supplemental oxygen. Xray reveals pulmonary edema. Will request admission.  Patient has taken his additional coumadin today as recommended by the coumadin clinic.   Final Clinical Impressions(s) / ED Diagnoses   Final diagnoses:  Acute on chronic congestive heart failure, unspecified heart failure type [facility])    ED Discharge Orders    None       Etta Quill, NP 08/14/18 [id number]    Dorie Rank, MD 08/15/18 0009

## 2018-08-14 NOTE — H&P (Signed)
History and Physical    [name]:[id number] DOB: 09/26/1935 DOA: 08/14/2018  PCP: Shaun Sou, MD Patient coming from: Home  Chief Complaint: Shortness of breath  HPI: [name] is a 82 y.o. male with medical history significant of mitral valve repair in 9937, prior embolic CVA, paroxysmal A. fib, hypertension, hyperlipidemia, CKD 3, COPD with chronic respiratory failure on home oxygen and chronic steroids, CAD, type 2 diabetes presenting to the hospital for evaluation of shortness of breath.  Patient reports having chronic dyspnea which has been worse recently.  States he is now dyspneic even with minimal exertion.  Also reports having orthopnea and paroxysmal nocturnal dyspnea.  Reports having stable chronic pedal edema.  Denies having any chest pain.  Reports wheezing and has been using his home COPD inhalers.  Reports having a cough productive of yellow-colored sputum.  Denies any history of blood clots.  Denies having any calf pain, erythema, or swelling.  Denies having any palpitations.  He continues to take Lasix 60 mg daily at home.  States he chronically uses 3 L home oxygen at all times.  ED Course: SPO2 84% per EMS.  Afebrile, slightly tachycardic, tachypneic, blood pressure stable, satting well on 4 L oxygen via nasal cannula.  White count 14.0.  BNP 98.  INR subtherapeutic at 0.9.  Chest x-ray showing interstitial pulmonary edema and possible small left effusion.  Review of Systems: As per HPI otherwise 10 point review of systems negative.  Past Medical History:  Diagnosis Date  . Adrenal adenoma   . Atrial fibrillation (HCC)    Rate control + Coumadin  . Atrial flutter ([place])   . CAD (coronary artery disease)    Lexiscan Myoview (01/2014): Lateral soft tissue attenuation, no ischemia, not gated, low risk  . Cancer ([place])   . CAP (community acquired pneumonia)    Hospitalized 10/2014  . Chronic diastolic heart failure ([place])   . Chronic hypoxemic respiratory  failure (HCC)    from COPD  . Chronic renal insufficiency, stage III (moderate) (HCC)    CrCl 50s  . COPD (chronic obstructive pulmonary disease) (HCC)    oxygen-dependent (2.5 L 24/7).  Improved on starting chronic prednisone 06/2017 (increased to 20 mg qd 08/2017)  Progressive worsening dyspnea through 2019: 3 L oxygen, 30mg  prednisone qd chronically as of 07/2018.  . Diabetes mellitus without complication ([place]) 16/96/7893   A1c 6.5 % 05/2016  . Diverticulosis   . DOE (dyspnea on exertion)    Multifactorial: deconditioning, COPD, obesity hypoventilation syndrome.  Marland Kitchen Dyslipidemia   . Embolic stroke ([place])    d/t endocarditis 2008  . Hemorrhoids   . History of mitral valve replacement    needs SBE prophylaxis  . History of prostate cancer remote past   f/u by Dr. Claris Che; no recurrence as of f/u 10/2015.  PSA rising from 2013 to 04/2018 (PSA 04/25/18=18.49): urol to rpt PSA and check CT and bone scan  approx 10/2018.  Marland Kitchen HTN (hypertension)   . Left lower lobe pulmonary nodule July/Aug 2019.   04/2018: enlarging irregular nodular opacity.  CT surgery-->PET scan: inflamm/post-infect vs malignant. Plan f/u CT chest 3 mo per CT surg.  . Mediastinal lymphadenopathy   . Pulmonary hypertension ([place]) 01/09/2015   secondary  . Right thyroid nodule   . Thrombocytopenia ([place])   . TIA (transient ischemic attack) 03/25/2011   ? of due to transient diplopia  . Ulcerative colitis    left-sided/segmental, associated with diverticulosis.  Sulfasalazine per GI (  Dr. Carlean Purl).  . Warthin's tumor     Past Surgical History:  Procedure Laterality Date  . carotid duplex dopplers  04/2014   No signif plaques; repeat 2 yrs per cardiology  . CATARACT EXTRACTION, BILATERAL    . COLONOSCOPY W/ BIOPSIES  12/19/2007   left-sided colitis, diverticulosis, hemorrhoids  . FLEXIBLE SIGMOIDOSCOPY  01/11/2008; 03/19/2010   2009 and 2011:segmental left colitis, diverticulosis, hemorrhoids  . INGUINAL HERNIA REPAIR     right  .  MITRAL VALVE REPAIR  09/22/04   and modified Cox-Maze IV   . MITRAL VALVE REPAIR  09/14/05   redo MV repair d/t endocardiits/embolic events  . PROSTATECTOMY  06/2003   Radical  . RIGHT HEART CATHETERIZATION N/A 01/01/2015   Procedure: RIGHT HEART CATH;  Surgeon: Peter M Martinique, MD;  Location: [facility] CATH LAB;  Service: Cardiovascular;  Laterality: N/A;  . SALIVARY GLAND SURGERY     left resection  . TRANSTHORACIC ECHOCARDIOGRAM  11/2013; 07/2017   2015: Mod LVH, EF 55-60%, no WM abnormalities, +LA dilation, +severe RV dilation and impaired systolic fxn, +increased pulm art pressures.  Valves ok.  2018- EF 60-65%, wall motion nl, s/p MV repair, mild RV syst dysf.      reports that he quit smoking about 15 years ago. His smoking use included cigarettes. He has a 180.00 pack-year smoking history. He has never used smokeless tobacco. He reports that he does not drink alcohol or use drugs.  No Known Allergies  Family History  Problem Relation Age of Onset  . Cancer Mother        spinal  . Stroke Father   . Heart disease Father   . Colon cancer Neg Hx   . Heart attack Neg Hx     Prior to Admission medications   Medication Sig Start Date End Date Taking? Authorizing Provider  albuterol (PROVENTIL HFA;VENTOLIN HFA) 108 (90 Base) MCG/ACT inhaler INHALE ONE PUFF BY MOUTH EVERY 6 HOURS AS NEEDED FOR WHEEZING AND FOR SHORTNESS OF BREATH (USING 5-6 TIMES EVERY 24 HOURS) Patient taking differently: Inhale 1 puff into the lungs every 6 (six) hours as needed for wheezing or shortness of breath.  01/01/18  Yes Collene Gobble, MD  albuterol (PROVENTIL) (2.5 MG/3ML) 0.083% nebulizer solution Take 3 mLs (2.5 mg total) by nebulization every 4 (four) hours. And as needed 12/29/17  Yes Byrum, Rose Fillers, MD  budesonide (PULMICORT) 0.5 MG/2ML nebulizer solution USE 1 VIAL IN NEBULIZER TWICE DAILY 06/25/18  Yes Byrum, Rose Fillers, MD  digoxin (LANOXIN) 0.125 MG tablet Take 1 tablet (0.125 mg total) by mouth daily. 01/10/18   Yes Lelon Perla, MD  diltiazem (CARDIZEM CD) 240 MG 24 hr capsule Take 1 capsule (240 mg total) by mouth daily. 01/10/18  Yes Lelon Perla, MD  furosemide (LASIX) 40 MG tablet Take 1.5 tablets (60 mg total) by mouth daily. 07/17/18 08/16/18 Yes Lelon Perla, MD  glipiZIDE (GLUCOTROL XL) 10 MG 24 hr tablet Take 1 tablet (10 mg total) by mouth daily with breakfast. 07/25/18  Yes McGowen, Adrian Blackwater, MD  LORazepam (ATIVAN) 0.5 MG tablet Take 1 tablet by mouth daily as needed for anxiety.  01/31/18  Yes [provider]  pravastatin (PRAVACHOL) 40 MG tablet TAKE 1 TABLET EVERY EVENING Patient taking differently: Take 40 mg by mouth daily. TAKE 1 TABLET EVERY EVENING 01/10/18  Yes Lelon Perla, MD  predniSONE (DELTASONE) 20 MG tablet Take 1.5 tablets (30 mg total) by mouth daily with breakfast. 07/24/18  Yes Byrum, Rose Fillers, MD  STIOLTO RESPIMAT 2.5-2.5 MCG/ACT AERS INHALE 2 PUFFS INTO THE LUNGS DAILY. Patient taking differently: Inhale 2 puffs into the lungs daily.  09/20/17  Yes Collene Gobble, MD  warfarin (COUMADIN) 5 MG tablet Take 1/2 to 1 tablet by mouth daily as directed Patient taking differently: Take 2.5-5 mg by mouth as directed. Take 1/2 tablet (2.5 mg) on MWF & Take 1 tablet (5 mg) all other days. 06/26/18  Yes Lelon Perla, MD  budesonide (PULMICORT) 0.5 MG/2ML nebulizer solution Take 2 mLs (0.5 mg total) by nebulization 2 (two) times daily. Patient not taking: Reported on 08/14/2018 05/28/18   Collene Gobble, MD    Physical Exam: Vitals:   08/14/18 1721 08/14/18 2028 08/14/18 2058 08/14/18 2059  BP:  129/60  132/72  Pulse:  91  (!) 107  Resp:  18  (!) 28  Temp:  98.2 F (36.8 C)  98.9 F (37.2 C)  TempSrc:  Oral  Oral  SpO2: 98% 96%  94%  Weight:   80.8 kg   Height:   5\' 8"  (1.727 m)     Physical Exam  Constitutional: He is oriented to person, place, and time. He appears well-developed and well-nourished. No distress.  HENT:  Head: Normocephalic.    Mouth/Throat: Oropharynx is clear and moist.  Eyes: Right eye exhibits no discharge. Left eye exhibits no discharge.  Neck: Neck supple. JVD present.  Cardiovascular: Regular rhythm and intact distal pulses.  Slightly tachycardic  Pulmonary/Chest: Effort normal. He has no wheezes. He has rales.  Bibasilar rales On 4 L supplemental oxygen via nasal cannula Speaking clearly in full sentences No accessory muscle use  Abdominal: Soft. Bowel sounds are normal. He exhibits no distension. There is no tenderness.  Musculoskeletal:  +1 bilateral pedal edema Calves appear symmetrical in size.  No erythema, increased warmth, or edema.  Neurological: He is alert and oriented to person, place, and time.  Skin: Skin is warm and dry. He is not diaphoretic.     Labs on Admission: I have personally reviewed following labs and imaging studies  CBC: Recent Labs  Lab 08/14/18 1714  WBC 14.0*  NEUTROABS 11.4*  HGB 13.5  HCT 44.1  MCV 88.7  PLT 630   Basic Metabolic Panel: Recent Labs  Lab 08/14/18 1714  NA 140  K 3.7  CL 99  CO2 31  GLUCOSE 259*  BUN 17  CREATININE 1.38*  CALCIUM 9.2   GFR: Estimated Creatinine Clearance: 41.3 mL/min (A) (by C-G formula based on SCr of 1.38 mg/dL (H)). Liver Function Tests: No results for input(s): AST, ALT, ALKPHOS, BILITOT, PROT, ALBUMIN in the last 168 hours. No results for input(s): LIPASE, AMYLASE in the last 168 hours. No results for input(s): AMMONIA in the last 168 hours. Coagulation Profile: Recent Labs  Lab 08/14/18 0946  INR 0.9*   Cardiac Enzymes: No results for input(s): CKTOTAL, CKMB, CKMBINDEX, TROPONINI in the last 168 hours. BNP (last 3 results) No results for input(s): PROBNP in the last 8760 hours. HbA1C: No results for input(s): HGBA1C in the last 72 hours. CBG: No results for input(s): GLUCAP in the last 168 hours. Lipid Profile: No results for input(s): CHOL, HDL, LDLCALC, TRIG, CHOLHDL, LDLDIRECT in the last 72  hours. Thyroid Function Tests: No results for input(s): TSH, T4TOTAL, FREET4, T3FREE, THYROIDAB in the last 72 hours. Anemia Panel: No results for input(s): VITAMINB12, FOLATE, FERRITIN, TIBC, IRON, RETICCTPCT in the last 72 hours. Urine analysis:  Component Value Date/Time   COLORURINE YELLOW 11/06/2014 0851   APPEARANCEUR CLEAR 11/06/2014 0851   LABSPEC 1.017 11/06/2014 0851   PHURINE 5.0 11/06/2014 0851   GLUCOSEU NEGATIVE 11/06/2014 0851   HGBUR SMALL (A) 11/06/2014 0851   BILIRUBINUR small 11/27/2014 [place] 11/06/2014 0851   PROTEINUR 100 11/27/2014 1025   PROTEINUR NEGATIVE 11/06/2014 0851   UROBILINOGEN 4.0 11/27/2014 1025   UROBILINOGEN 0.2 11/06/2014 0851   NITRITE negative 11/27/2014 1025   NITRITE NEGATIVE 11/06/2014 0851   LEUKOCYTESUR Negative 11/27/2014 1025    Radiological Exams on Admission: Dg Chest Portable 1 View  Result Date: 08/14/2018 CLINICAL DATA:  82 y/o M; shortness of breath, hypoxia, history of CABG. Former smoker. EXAM: PORTABLE CHEST 1 VIEW COMPARISON:  05/19/2018 chest radiograph. FINDINGS: Stable cardiomegaly given projection and technique. Post median sternotomy with wires in alignment. Mitral valve annuloplasty. Calcific aortic atherosclerosis. Diffuse reticular opacities of the lungs. Possible small left effusion. No acute osseous abnormality is evident. IMPRESSION: 1. Interstitial pulmonary edema and possible small left effusion. 2. Cardiomegaly.  Aortic Atherosclerosis (ICD10-I70.0). Electronically Signed   By: Kristine Garbe M.D.   On: 08/14/2018 15:46    EKG: Independently reviewed.  A. fib (heart rate 91) disease, PVCs.  Assessment/Plan Active Problems:   Essential hypertension, benign   CAD (coronary artery disease)   COPD (chronic obstructive pulmonary disease) with emphysema (HCC)   History of mitral valve repair   Atrial fibrillation (HCC)   Acute on chronic respiratory failure with hypoxia (HCC)    Chronic congestive heart failure (HCC)   DM2 (diabetes mellitus, type 2) (HCC)   Acute exacerbation of congestive heart failure (HCC)   HLD (hyperlipidemia)   CKD (chronic kidney disease) stage 3, GFR 30-59 ml/min (HCC)   Leukocytosis   Acute on chronic hypoxic respiratory failure secondary to acute exacerbation of congestive heart failure and underlying mitral valve disease -Echo done in June 2019 showing normal systolic function (EF 60 to 65%), post mitral valve repair with continued elevated gradients and trivial residual mitral regurgitation post repair, PA pressure 72 mmHg. -SPO2 84% per EMS.  Uses 3 L supplemental oxygen at home, currently satting well on 4 L oxygen via nasal cannula.   -BNP not remarkable, however, chest x-ray showing interstitial pulmonary edema and possible small left effusion.  Patient noted to have JVD, rales, and pedal edema on exam. Will give IV Lasix 60 mg BID.  Monitor intake and output.  Daily weights.  Low-sodium diet with fluid restriction. -If no clinical improvement with IV diuresis, CTA needs to be done to rule out PE.  Patient is on chronic Coumadin due to history of A. Fib, however, INR was subtherapeutic on admission.  Mild leukocytosis White count 14.0 in the setting of chronic home steroid use for COPD.  Patient is afebrile.  Chest x-ray not suggestive of pneumonia. -Check UA -Repeat CBC in a.m.  Paroxysmal A. Fib -CHA2DS2VAsc 6.  Currently rate controlled.  Continue home Cardizem, digoxin. -INR subtherapeutic at 0.9.  Continue Coumadin with goal INR 2-3.   CAD Patient is not complaining of chest pain.  EKG not suggestive of ACS. -Check troponin level -Continue home Cardizem  COPD -Stable.  No bronchospasm.  Continue home inhalers, prednisone.  Hypertension -Continue home Cardizem -Continue IV diuresis  Hyperlipidemia -Continue home statin  Type 2 diabetes A1c 8.8 on July 24, 2018.  Blood glucose 259.  Patient is not on home  insulin. -Lantus 5 units at bedtime -Sliding scale insulin  sensitive -CBG checks  CKD 3 -Creatinine 1.3, recent baseline 1.0-1.2. -Continue to monitor renal function with IV diuresis  Physical deconditioning -PT evaluation  DVT prophylaxis: Coumadin Code Status: Patient wishes to be DNR. Family Communication: Wife at bedside Disposition Plan: Anticipate discharge to home after clinical improvement. Consults called: None  Admission status: Observation  Shela Leff MD [facility] Pager [phone]  If 7PM-7AM, please contact night-coverage [email or web address] Password [facility]  08/14/2018, 11:53 PM

## 2018-08-14 NOTE — Progress Notes (Signed)
ANTICOAGULATION CONSULT NOTE - Initial Consult  Pharmacy Consult for Warfarin Indication: atrial fibrillation  No Known Allergies  Patient Measurements: Height: 5\' 8"  (172.7 cm) Weight: 178 lb 2.1 oz (80.8 kg) IBW/kg (Calculated) : 68.4   Vital Signs: Temp: 98.9 F (37.2 C) (11/26 2059) Temp Source: Oral (11/26 2059) BP: 132/72 (11/26 2059) Pulse Rate: 107 (11/26 2059)  Labs: Recent Labs    08/14/18 0946 08/14/18 1714  HGB  --  13.5  HCT  --  44.1  PLT  --  158  INR 0.9*  --   CREATININE  --  1.38*    Estimated Creatinine Clearance: 41.3 mL/min (A) (by C-G formula based on SCr of 1.38 mg/dL (H)).   Medical History: Past Medical History:  Diagnosis Date  . Adrenal adenoma   . Atrial fibrillation (HCC)    Rate control + Coumadin  . Atrial flutter ([place])   . CAD (coronary artery disease)    Lexiscan Myoview (01/2014): Lateral soft tissue attenuation, no ischemia, not gated, low risk  . Cancer ([place])   . CAP (community acquired pneumonia)    Hospitalized 10/2014  . Chronic diastolic heart failure ([place])   . Chronic hypoxemic respiratory failure (HCC)    from COPD  . Chronic renal insufficiency, stage III (moderate) (HCC)    CrCl 50s  . COPD (chronic obstructive pulmonary disease) (HCC)    oxygen-dependent (2.5 L 24/7).  Improved on starting chronic prednisone 06/2017 (increased to 20 mg qd 08/2017)  Progressive worsening dyspnea through 2019: 3 L oxygen, 30mg  prednisone qd chronically as of 07/2018.  . Diabetes mellitus without complication ([place]) 02/63/7858   A1c 6.5 % 05/2016  . Diverticulosis   . DOE (dyspnea on exertion)    Multifactorial: deconditioning, COPD, obesity hypoventilation syndrome.  Marland Kitchen Dyslipidemia   . Embolic stroke ([place])    d/t endocarditis 2008  . Hemorrhoids   . History of mitral valve replacement    needs SBE prophylaxis  . History of prostate cancer remote past   f/u by Dr. Claris Che; no recurrence as of f/u 10/2015.  PSA rising from 2013 to  04/2018 (PSA 04/25/18=18.49): urol to rpt PSA and check CT and bone scan  approx 10/2018.  Marland Kitchen HTN (hypertension)   . Left lower lobe pulmonary nodule July/Aug 2019.   04/2018: enlarging irregular nodular opacity.  CT surgery-->PET scan: inflamm/post-infect vs malignant. Plan f/u CT chest 3 mo per CT surg.  . Mediastinal lymphadenopathy   . Pulmonary hypertension ([place]) 01/09/2015   secondary  . Right thyroid nodule   . Thrombocytopenia ([place])   . TIA (transient ischemic attack) 03/25/2011   ? of due to transient diplopia  . Ulcerative colitis    left-sided/segmental, associated with diverticulosis.  Sulfasalazine per GI (Dr. Carlean Purl).  . Warthin's tumor     Medications:  Scheduled:  . albuterol  2.5 mg Nebulization Q4H  . arformoterol  15 mcg Nebulization BID  . budesonide  0.5 mg Nebulization BID  . [START ON 08/15/2018] digoxin  0.125 mg Oral Daily  . [START ON 08/15/2018] diltiazem  240 mg Oral Daily  . furosemide  60 mg Intravenous Q12H  . [START ON 08/15/2018] pravastatin  40 mg Oral q1800  . [START ON 08/15/2018] predniSONE  30 mg Oral Q breakfast  . [START ON 08/15/2018] umeclidinium bromide  1 puff Inhalation Daily   Infusions:    Assessment: 82 yoM on chronic warfarin for a-fib admitted with SOB.  HD 2.5 mg M/W/F and 5 mg other days.  INR = 0.9 at anticoag visit 11/26.  LD 11/26 at 1100- 10 mg.   Goal of Therapy:  INR 2-3    Plan:  Daily PT/INR  [name] 08/14/2018,11:38 PM

## 2018-08-14 NOTE — Patient Instructions (Signed)
Medication Instructions:  Your physician recommends that you continue on your current medications as directed. Please refer to the Current Medication list given to you today. If you need a refill on your cardiac medications before your next appointment, please call your pharmacy.   Lab work: None  If you have labs (blood work) drawn today and your tests are completely normal, you will receive your results only by: Marland Kitchen MyChart Message (if you have MyChart) OR . A paper copy in the mail If you have any lab test that is abnormal or we need to change your treatment, we will call you to review the results.  Testing/Procedures: None   Follow-Up: At [facility], you and your health needs are our priority.  As part of our continuing mission to provide you with exceptional heart care, we have created designated Provider Care Teams.  These Care Teams include your primary Cardiologist (physician) and Advanced Practice Providers (APPs -  Physician Assistants and Nurse Practitioners) who all work together to provide you with the care you need, when you need it. . Your physician recommends that you schedule a follow-up appointment in: 2 [place]  Any Other Special Instructions Will Be Listed Below (If Applicable).

## 2018-08-14 NOTE — Progress Notes (Signed)
08/14/2018 [name]   Nov 24, 1935  [id number]  Primary Physician McGowen, Adrian Blackwater, MD Primary Cardiologist: Dr Stanford Breed  HPI: [name] is an 82 year old male followed by Dr. Stanford Breed with a history of mitral valve repair in 2130, prior embolic CVA, PAF, hyperlipidemia, hypertension, COPD with chronic respiratory failure on home O2 and steroids.  He was seen in October by Dr. Stanford Breed and had some volume overload.  His Lasix was increased for a few days and this seemed to help.  He is also followed up with Dr. Lamonte Sakai in the interim.  Patient has significant COPD and chronic respiratory failure.  He is on chronic oxygen and steroids.  He complained to me of being short of breath although it is not clear to me that this is a change for him.  His weight is unchanged from previous weights.  His wife thought he had some swelling in his feet and on exam he did have some puffiness on the side of his foot but there was no significant ankle edema or pretibial edema.   Current Outpatient Medications  Medication Sig Dispense Refill  . albuterol (PROVENTIL HFA;VENTOLIN HFA) 108 (90 Base) MCG/ACT inhaler INHALE ONE PUFF BY MOUTH EVERY 6 HOURS AS NEEDED FOR WHEEZING AND FOR SHORTNESS OF BREATH (USING 5-6 TIMES EVERY 24 HOURS) 1 Inhaler 5  . albuterol (PROVENTIL) (2.5 MG/3ML) 0.083% nebulizer solution Take 3 mLs (2.5 mg total) by nebulization every 4 (four) hours. And as needed 300 mL 5  . budesonide (PULMICORT) 0.5 MG/2ML nebulizer solution Take 2 mLs (0.5 mg total) by nebulization 2 (two) times daily. 120 mL 5  . budesonide (PULMICORT) 0.5 MG/2ML nebulizer solution USE 1 VIAL IN NEBULIZER TWICE DAILY 120 mL 5  . digoxin (LANOXIN) 0.125 MG tablet Take 1 tablet (0.125 mg total) by mouth daily. 90 tablet 3  . diltiazem (CARDIZEM CD) 240 MG 24 hr capsule Take 1 capsule (240 mg total) by mouth daily. 90 capsule 3  . furosemide (LASIX) 40 MG tablet Take 1.5 tablets (60 mg total) by mouth daily. 135 tablet  3  . glipiZIDE (GLUCOTROL XL) 10 MG 24 hr tablet Take 1 tablet (10 mg total) by mouth daily with breakfast. 90 tablet 1  . LORazepam (ATIVAN) 0.5 MG tablet Take 1 tablet by mouth daily as needed.    . pravastatin (PRAVACHOL) 40 MG tablet TAKE 1 TABLET EVERY EVENING (Patient taking differently: Take 40 mg by mouth daily. TAKE 1 TABLET EVERY EVENING) 90 tablet 3  . predniSONE (DELTASONE) 20 MG tablet Take 1.5 tablets (30 mg total) by mouth daily with breakfast. 135 tablet 1  . STIOLTO RESPIMAT 2.5-2.5 MCG/ACT AERS INHALE 2 PUFFS INTO THE LUNGS DAILY. 12 g 3  . warfarin (COUMADIN) 5 MG tablet Take 1/2 to 1 tablet by mouth daily as directed 90 tablet 1   No current facility-administered medications for this visit.     No Known Allergies  Past Medical History:  Diagnosis Date  . Adrenal adenoma   . Atrial fibrillation (HCC)    Rate control + Coumadin  . Atrial flutter ([place])   . CAD (coronary artery disease)    Lexiscan Myoview (01/2014): Lateral soft tissue attenuation, no ischemia, not gated, low risk  . Cancer ([place])   . CAP (community acquired pneumonia)    Hospitalized 10/2014  . Chronic diastolic heart failure ([place])   . Chronic hypoxemic respiratory failure (HCC)    from COPD  . Chronic renal insufficiency, stage III (moderate) (HCC)  CrCl 50s  . COPD (chronic obstructive pulmonary disease) (HCC)    oxygen-dependent (2.5 L 24/7).  Improved on starting chronic prednisone 06/2017 (increased to 20 mg qd 08/2017)  Progressive worsening dyspnea through 2019: 3 L oxygen, 30mg  prednisone qd chronically as of 07/2018.  . Diabetes mellitus without complication ([place]) 66/02/3015   A1c 6.5 % 05/2016  . Diverticulosis   . DOE (dyspnea on exertion)    Multifactorial: deconditioning, COPD, obesity hypoventilation syndrome.  Marland Kitchen Dyslipidemia   . Embolic stroke ([place])    d/t endocarditis 2008  . Hemorrhoids   . History of mitral valve replacement    needs SBE prophylaxis  . History of prostate  cancer remote past   f/u by Dr. Claris Che; no recurrence as of f/u 10/2015.  PSA rising from 2013 to 04/2018 (PSA 04/25/18=18.49): urol to rpt PSA and check CT and bone scan  approx 10/2018.  Marland Kitchen HTN (hypertension)   . Left lower lobe pulmonary nodule July/Aug 2019.   04/2018: enlarging irregular nodular opacity.  CT surgery-->PET scan: inflamm/post-infect vs malignant. Plan f/u CT chest 3 mo per CT surg.  . Mediastinal lymphadenopathy   . Pulmonary hypertension ([place]) 01/09/2015   secondary  . Right thyroid nodule   . Thrombocytopenia ([place])   . TIA (transient ischemic attack) 03/25/2011   ? of due to transient diplopia  . Ulcerative colitis    left-sided/segmental, associated with diverticulosis.  Sulfasalazine per GI (Dr. Carlean Purl).  . Warthin's tumor     Social History   Socioeconomic History  . Marital status: Married    Spouse name: [name]  . Number of children: 2  . Years of education: Not on file  . Highest education level: Not on file  Occupational History  . Occupation: RETIRED    Comment: Careers adviser: RETIRED  Social Needs  . Financial resource strain: Not on file  . Food insecurity:    Worry: Not on file    Inability: Not on file  . Transportation needs:    Medical: Not on file    Non-medical: Not on file  Tobacco Use  . Smoking status: Former Smoker    Packs/day: 3.00    Years: 60.00    Pack years: 180.00    Types: Cigarettes    Last attempt to quit: 09/19/2002    Years since quitting: 15.9  . Smokeless tobacco: Never Used  Substance and Sexual Activity  . Alcohol use: No    Alcohol/week: 0.0 standard drinks  . Drug use: No  . Sexual activity: Not on file  Lifestyle  . Physical activity:    Days per week: Not on file    Minutes per session: Not on file  . Stress: Not on file  Relationships  . Social connections:    Talks on phone: Not on file    Gets together: Not on file    Attends religious service: Not on file    Active member of club or  organization: Not on file    Attends meetings of clubs or organizations: Not on file    Relationship status: Not on file  . Intimate partner violence:    Fear of current or ex partner: Not on file    Emotionally abused: Not on file    Physically abused: Not on file    Forced sexual activity: Not on file  Other Topics Concern  . Not on file  Social History Narrative   Married, 2 children.   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   180 pack-year tob hx.  No alcohol.     Family History  Problem Relation Age of Onset  . Cancer Mother        spinal  . Stroke Father   . Heart disease Father   . Colon cancer Neg Hx   . Heart attack Neg Hx      Review of Systems: General: negative for chills, fever, night sweats or weight changes.  Cardiovascular: negative for chest pain, dyspnea on exertion, edema, orthopnea, palpitations, paroxysmal nocturnal dyspnea or shortness of breath Dermatological: negative for rash Respiratory: negative for cough or wheezing Urologic: negative for hematuria Abdominal: negative for nausea, vomiting, diarrhea, bright red blood per rectum, melena, or hematemesis Neurologic: negative for visual changes, syncope, or dizziness All other systems reviewed and are otherwise negative except as noted above.    Blood pressure 131/66, pulse (!) 25, height 5\' 8"  (1.727 m), weight 186 lb (84.4 kg), SpO2 97 %.  General appearance: alert, cooperative, appears stated age, mild distress and Patient is in a wheelchair on oxygen Lungs: Decreased breath sounds with faint expiratory wheezing Heart: irregularly irregular rhythm Extremities: trace edema Skin: cool and dry Neurologic: Grossly normal   BMP 07/24/18 BUN 19/ SCr 1.09    ASSESSMENT AND PLAN:    paroxysmal atrial fibrillation-plan to continue Cardizem and digoxin.  It looks like he was in NSR in Sep- now  AF with CVR. Continue Coumadin with goal INR 2-3.   hypertension-blood pressure is controlled.   Continue present medications and follow.   hyperlipidemia-continue statin.  History of mitral valve repair-continue SBE prophylaxis.  Patient has an elevated mean gradient across his mitral valve.  However he is clear that he would never consider surgery again and given COPD and lung mass he would likely not be a candidate.  He is not significantly volume overloaded on examination.  Continue present dose of Lasix.  Dyspnea--felt to be multifactorial including mitral valve disease, deconditioning, obesity hypoventilation  syndrome and COPD.  Lung mass-followed by pulmonary. Not a malignancy by PET scan  AAA--will need follow-up studies in the future though doubt he would be a candidate for repair.  PLAN this is the first time I seen Mr. Xue.  Based on his chart and his exam and history he appears to be at baseline.  I did suggest to the family that if he gets into trouble at home they should take him sooner rather than later to the emergency room as I suspect he would decompensate quickly.  He has a follow-up with Dr. Stanford Breed in January and will keep this.  Kerin Ransom PA-C 08/14/2018 9:23 AM

## 2018-08-14 NOTE — ED Triage Notes (Addendum)
Pt brought in by EMS for SOB and decreased O2 saturation.  Paramedic stated that upon arrival pt had o2 sat of 84%  Family stated that patient was on non-re breather at home and de-saturated after two steps.  Pts color is normal and patient can talk with minimal difficulty.  Pt is incontinent of urine.

## 2018-08-14 NOTE — Patient Instructions (Signed)
Description   Take an extra tablet today (2 total tablets), Tuesday Nov 26, then 1.5 tablets Wednesday Nov 27.  Then continue same dosage 1 tablet daily except 1/2 tablet on Mondays, Wednesdays, and Fridays.  Prednisone 30 mg daily maintenance dose. Recheck INR in 2 weeks.  Call with new medications or any changes [phone].

## 2018-08-15 ENCOUNTER — Other Ambulatory Visit (HOSPITAL_COMMUNITY): Payer: Medicare Other

## 2018-08-15 DIAGNOSIS — J181 Lobar pneumonia, unspecified organism: Secondary | ICD-10-CM | POA: Diagnosis not present

## 2018-08-15 DIAGNOSIS — Z9842 Cataract extraction status, left eye: Secondary | ICD-10-CM | POA: Diagnosis not present

## 2018-08-15 DIAGNOSIS — Z8546 Personal history of malignant neoplasm of prostate: Secondary | ICD-10-CM | POA: Diagnosis not present

## 2018-08-15 DIAGNOSIS — E1165 Type 2 diabetes mellitus with hyperglycemia: Secondary | ICD-10-CM | POA: Diagnosis not present

## 2018-08-15 DIAGNOSIS — K5790 Diverticulosis of intestine, part unspecified, without perforation or abscess without bleeding: Secondary | ICD-10-CM | POA: Diagnosis present

## 2018-08-15 DIAGNOSIS — I272 Pulmonary hypertension, unspecified: Secondary | ICD-10-CM | POA: Diagnosis present

## 2018-08-15 DIAGNOSIS — J432 Centrilobular emphysema: Secondary | ICD-10-CM | POA: Diagnosis not present

## 2018-08-15 DIAGNOSIS — E785 Hyperlipidemia, unspecified: Secondary | ICD-10-CM | POA: Diagnosis present

## 2018-08-15 DIAGNOSIS — Z952 Presence of prosthetic heart valve: Secondary | ICD-10-CM | POA: Diagnosis not present

## 2018-08-15 DIAGNOSIS — I130 Hypertensive heart and chronic kidney disease with heart failure and stage 1 through stage 4 chronic kidney disease, or unspecified chronic kidney disease: Secondary | ICD-10-CM | POA: Diagnosis not present

## 2018-08-15 DIAGNOSIS — J438 Other emphysema: Secondary | ICD-10-CM | POA: Diagnosis not present

## 2018-08-15 DIAGNOSIS — Z7984 Long term (current) use of oral hypoglycemic drugs: Secondary | ICD-10-CM | POA: Diagnosis not present

## 2018-08-15 DIAGNOSIS — R0602 Shortness of breath: Secondary | ICD-10-CM | POA: Diagnosis not present

## 2018-08-15 DIAGNOSIS — I4819 Other persistent atrial fibrillation: Secondary | ICD-10-CM | POA: Diagnosis not present

## 2018-08-15 DIAGNOSIS — N183 Chronic kidney disease, stage 3 (moderate): Secondary | ICD-10-CM | POA: Diagnosis not present

## 2018-08-15 DIAGNOSIS — Z9841 Cataract extraction status, right eye: Secondary | ICD-10-CM | POA: Diagnosis not present

## 2018-08-15 DIAGNOSIS — Z7951 Long term (current) use of inhaled steroids: Secondary | ICD-10-CM | POA: Diagnosis not present

## 2018-08-15 DIAGNOSIS — Z8673 Personal history of transient ischemic attack (TIA), and cerebral infarction without residual deficits: Secondary | ICD-10-CM | POA: Diagnosis not present

## 2018-08-15 DIAGNOSIS — I5033 Acute on chronic diastolic (congestive) heart failure: Secondary | ICD-10-CM | POA: Diagnosis not present

## 2018-08-15 DIAGNOSIS — N189 Chronic kidney disease, unspecified: Secondary | ICD-10-CM | POA: Diagnosis not present

## 2018-08-15 DIAGNOSIS — I480 Paroxysmal atrial fibrillation: Secondary | ICD-10-CM | POA: Diagnosis not present

## 2018-08-15 DIAGNOSIS — Z9981 Dependence on supplemental oxygen: Secondary | ICD-10-CM | POA: Diagnosis not present

## 2018-08-15 DIAGNOSIS — R042 Hemoptysis: Secondary | ICD-10-CM | POA: Diagnosis not present

## 2018-08-15 DIAGNOSIS — I10 Essential (primary) hypertension: Secondary | ICD-10-CM | POA: Diagnosis not present

## 2018-08-15 DIAGNOSIS — Z9889 Other specified postprocedural states: Secondary | ICD-10-CM | POA: Diagnosis not present

## 2018-08-15 DIAGNOSIS — I472 Ventricular tachycardia: Secondary | ICD-10-CM | POA: Diagnosis not present

## 2018-08-15 DIAGNOSIS — E1122 Type 2 diabetes mellitus with diabetic chronic kidney disease: Secondary | ICD-10-CM | POA: Diagnosis not present

## 2018-08-15 DIAGNOSIS — D696 Thrombocytopenia, unspecified: Secondary | ICD-10-CM | POA: Diagnosis present

## 2018-08-15 DIAGNOSIS — E662 Morbid (severe) obesity with alveolar hypoventilation: Secondary | ICD-10-CM | POA: Diagnosis not present

## 2018-08-15 DIAGNOSIS — I2510 Atherosclerotic heart disease of native coronary artery without angina pectoris: Secondary | ICD-10-CM | POA: Diagnosis not present

## 2018-08-15 DIAGNOSIS — N179 Acute kidney failure, unspecified: Secondary | ICD-10-CM | POA: Diagnosis not present

## 2018-08-15 DIAGNOSIS — Z7901 Long term (current) use of anticoagulants: Secondary | ICD-10-CM | POA: Diagnosis not present

## 2018-08-15 DIAGNOSIS — I5043 Acute on chronic combined systolic (congestive) and diastolic (congestive) heart failure: Secondary | ICD-10-CM | POA: Diagnosis not present

## 2018-08-15 DIAGNOSIS — J9621 Acute and chronic respiratory failure with hypoxia: Secondary | ICD-10-CM | POA: Diagnosis not present

## 2018-08-15 LAB — URINALYSIS, ROUTINE W REFLEX MICROSCOPIC
Bilirubin Urine: NEGATIVE
Glucose, UA: >=500 mg/dL — AB
KETONES UR: NEGATIVE mg/dL
Leukocytes, UA: NEGATIVE
Nitrite: NEGATIVE
PH: 6 (ref 5.0–8.0)
Protein, ur: NEGATIVE mg/dL
Specific Gravity, Urine: 1.012 (ref 1.005–1.030)

## 2018-08-15 LAB — GLUCOSE, CAPILLARY
GLUCOSE-CAPILLARY: 182 mg/dL — AB (ref 70–99)
GLUCOSE-CAPILLARY: 314 mg/dL — AB (ref 70–99)
Glucose-Capillary: 233 mg/dL — ABNORMAL HIGH (ref 70–99)
Glucose-Capillary: 300 mg/dL — ABNORMAL HIGH (ref 70–99)
Glucose-Capillary: 248 mg/dL — ABNORMAL HIGH (ref 70–99)

## 2018-08-15 LAB — BASIC METABOLIC PANEL
Anion gap: 11 (ref 5–15)
BUN: 18 mg/dL (ref 8–23)
CHLORIDE: 99 mmol/L (ref 98–111)
CO2: 29 mmol/L (ref 22–32)
CREATININE: 1.32 mg/dL — AB (ref 0.61–1.24)
Calcium: 8.8 mg/dL — ABNORMAL LOW (ref 8.9–10.3)
GFR, EST AFRICAN AMERICAN: 59 mL/min — AB (ref >60)
GFR, EST NON AFRICAN AMERICAN: 51 mL/min — AB (ref >60)
GLUCOSE: 177 mg/dL — AB (ref 70–99)
Potassium: 3.4 mmol/L — ABNORMAL LOW (ref 3.5–5.1)
Sodium: 139 mmol/L (ref 135–145)

## 2018-08-15 LAB — CBC
HEMATOCRIT: 46.1 % (ref 39.0–52.0)
Hemoglobin: 13.4 g/dL (ref 13.0–17.0)
MCH: 26.7 pg (ref 26.0–34.0)
MCHC: 29.1 g/dL — AB (ref 30.0–36.0)
MCV: 92 fL (ref 80.0–100.0)
Platelets: 149 10*3/uL — ABNORMAL LOW (ref 150–400)
RBC: 5.01 MIL/uL (ref 4.22–5.81)
RDW: 16.2 % — AB (ref 11.5–15.5)
WBC: 13.8 10*3/uL — AB (ref 4.0–10.5)
nRBC: 0.1 % (ref 0.0–0.2)

## 2018-08-15 LAB — TROPONIN I
Troponin I: 0.09 ng/mL (ref <0.03)
Troponin I: 0.1 ng/mL (ref <0.03)
Troponin I: 0.09 ng/mL (ref <0.03)

## 2018-08-15 LAB — PROTIME-INR
INR: 1.07
Prothrombin Time: 13.8 seconds (ref 11.4–15.2)

## 2018-08-15 MED ORDER — HEPARIN BOLUS VIA INFUSION
2000.0000 [IU] | Freq: Once | INTRAVENOUS | Status: AC
  Administered 2018-08-15: 2000 [IU] via INTRAVENOUS
  Filled 2018-08-15: qty 2000

## 2018-08-15 MED ORDER — WARFARIN SODIUM 5 MG PO TABS
5.0000 mg | ORAL_TABLET | Freq: Once | ORAL | Status: AC
  Administered 2018-08-15: 5 mg via ORAL
  Filled 2018-08-15: qty 1

## 2018-08-15 MED ORDER — INSULIN GLARGINE 100 UNIT/ML ~~LOC~~ SOLN
10.0000 [IU] | Freq: Every day | SUBCUTANEOUS | Status: DC
  Administered 2018-08-15 – 2018-08-16 (×2): 10 [IU] via SUBCUTANEOUS
  Filled 2018-08-15 (×3): qty 0.1

## 2018-08-15 MED ORDER — WARFARIN - PHARMACIST DOSING INPATIENT
Freq: Every day | Status: DC
  Administered 2018-08-16: 17:00:00

## 2018-08-15 MED ORDER — HEPARIN (PORCINE) 25000 UT/250ML-% IV SOLN
1200.0000 [IU]/h | INTRAVENOUS | Status: DC
  Administered 2018-08-15: 1200 [IU]/h via INTRAVENOUS
  Filled 2018-08-15: qty 250

## 2018-08-15 MED ORDER — POTASSIUM CHLORIDE CRYS ER 20 MEQ PO TBCR
40.0000 meq | EXTENDED_RELEASE_TABLET | Freq: Once | ORAL | Status: AC
  Administered 2018-08-15: 40 meq via ORAL
  Filled 2018-08-15: qty 2

## 2018-08-15 MED ORDER — ALBUTEROL SULFATE (2.5 MG/3ML) 0.083% IN NEBU
2.5000 mg | INHALATION_SOLUTION | RESPIRATORY_TRACT | Status: DC | PRN
  Administered 2018-08-16: 2.5 mg via RESPIRATORY_TRACT
  Filled 2018-08-15: qty 3

## 2018-08-15 MED ORDER — ORAL CARE MOUTH RINSE
15.0000 mL | Freq: Two times a day (BID) | OROMUCOSAL | Status: DC
  Administered 2018-08-16 – 2018-08-18 (×3): 15 mL via OROMUCOSAL

## 2018-08-15 MED ORDER — GUAIFENESIN-DM 100-10 MG/5ML PO SYRP
5.0000 mL | ORAL_SOLUTION | ORAL | Status: DC | PRN
  Filled 2018-08-15: qty 10

## 2018-08-15 NOTE — Progress Notes (Addendum)
PROGRESS NOTE   [name]  YBW:[id number]    DOB: [birth year]    DOA: 08/14/2018  PCP: Tammi Sou, MD   I have briefly reviewed patients previous medical records in [facility].  Brief Narrative:  82 year old male, lives at home with his spouse, independent, usually sleeps on recliner, PMH of PAF on Coumadin, CAD, chronic diastolic CHF, chronic hypoxic respiratory failure on home oxygen 2-3 L/min continuously, stage III chronic kidney disease, COPD on chronic prednisone, DM 2, HLD, embolic CVA, MVR, HTN, presented to ED with worsening of chronic dyspnea, orthopnea, PND, has chronic leg edema, intermittent productive cough of brown sputum and occasional specks of blood.  Admitted for decompensated CHF and acute on chronic hypoxic respiratory failure.   Assessment & Plan:   Active Problems:   Essential hypertension, benign   CAD (coronary artery disease)   COPD (chronic obstructive pulmonary disease) with emphysema (HCC)   History of mitral valve repair   Atrial fibrillation (HCC)   Acute on chronic respiratory failure with hypoxia (HCC)   Chronic congestive heart failure (HCC)   DM2 (diabetes mellitus, type 2) (HCC)   Acute exacerbation of congestive heart failure (HCC)   HLD (hyperlipidemia)   CKD (chronic kidney disease) stage 3, GFR 30-59 ml/min (HCC)   Leukocytosis   Acute on chronic diastolic CHF: TTE 7/68: LVEF 60-65%, severe LVH, post MV repair and PA peak pressure 72 mmHg.  Reports compliance with medications and daily weights.  Unclear etiology.  Started on IV Lasix 60 mg every 12 hours.  -670 mL thus far.  Continue current management and follow closely.  Acute on chronic hypoxic respiratory failure: Likely due to decompensated CHF complicating underlying COPD and pulmonary hypertension.  Treat CHF as above and wean oxygen to prior levels as tolerated.  Had oxygen saturation of 84% per EMS.  COPD: No clinical bronchospasm.  Oxygen supplements and as needed  bronchodilator nebulizations.  Continue chronic prednisone.  Essential hypertension: Controlled.  Hypokalemia: Replace and follow.  Elevated troponin: Mild and flat trend.  Likely related to demand ischemia from decompensated CHF, respiratory failure and chronic kidney disease.  Paroxysmal A. Fib: CHA2DS2VAsc 6.  Rate controlled.  Continue digoxin, Cardizem.  INR subtherapeutic.  Coumadin per pharmacy.  However given prior history of embolic strokes, IV heparin bridging until INR therapeutic.  CAD: No angina symptoms.  Hyperlipidemia: Continue statins.  DM2: A1c 8.8 on 07/24/2018 indicating poor control.  Increase Lantus to 10 units at bedtime.  Continue SSI.  Stage III chronic kidney disease: Recent baseline creatinine 1-1.2.  Creatinine on admission 1.3, stable.  Follow BMP closely.  History of embolic CVA: Anticoagulation discussion as above.  Hemoptysis, mild: Likely related to CHF/pulmonary edema.  Monitor closely while on anticoagulation as discussed.  Leukocytosis: Suspect due to chronic steroids.  No clinical concern for infection.  Thrombocytopenia: Monitor closely.    DVT prophylaxis: IV heparin bridging until INR therapeutic on Coumadin. Code Status: DNR Family Communication: Discussed in detail with patient's son at bedside. Disposition: To be determined pending clinical improvement and PT evaluation.  Patient was admitted as observation.  However he has significant volume overload requiring continued IV diuresis and close monitoring.  Hence will change to inpatient status.   Consultants:  None  Procedures:  None  Antimicrobials:  None   Subjective: Overall patient feels better.  Dyspnea improved but not close to baseline.  Cough with intermittent brown sputum and mild blood in sputum.  No chest pain, palpitations, dizziness or  lightheadedness.  ROS: Stays with spouse and does not use any assistance for walking.  Otherwise negative.  Objective:  Vitals:     08/15/18 0859 08/15/18 1149 08/15/18 1523 08/15/18 1545  BP: 121/73  126/73   Pulse: (!) 116  75   Resp:   (!) 28   Temp:   98 F (36.7 C)   TempSrc:   Oral   SpO2:  94% 95% 95%  Weight:      Height:        Examination:  General exam: Elderly male, moderately built and nourished, sitting up comfortably in reclining chair this morning. Respiratory system: Reduced breath sounds in the bases with few bibasilar crackles.  Rest of lung fields clear to auscultation. Respiratory effort normal. Cardiovascular system: S1 & S2 heard, irregularly irregular. No murmurs, rubs, gallops or clicks. No pedal edema but has 1+ presacral edema.  JVD +.  Telemetry personally reviewed: A. fib with ventricular rate in the 100-110's. Gastrointestinal system: Abdomen is nondistended, soft and nontender. No organomegaly or masses felt. Normal bowel sounds heard. Central nervous system: Alert and oriented. No focal neurological deficits. Extremities: Symmetric 5 x 5 power. Skin: No rashes, lesions or ulcers Psychiatry: Judgement and insight appear normal. Mood & affect appropriate.     Data Reviewed: I have personally reviewed following labs and imaging studies  CBC: Recent Labs  Lab 08/14/18 1714 08/15/18 0613  WBC 14.0* 13.8*  NEUTROABS 11.4*  --   HGB 13.5 13.4  HCT 44.1 46.1  MCV 88.7 92.0  PLT 158 417*   Basic Metabolic Panel: Recent Labs  Lab 08/14/18 1714 08/15/18 0613  NA 140 139  K 3.7 3.4*  CL 99 99  CO2 31 29  GLUCOSE 259* 177*  BUN 17 18  CREATININE 1.38* 1.32*  CALCIUM 9.2 8.8*   Coagulation Profile: Recent Labs  Lab 08/14/18 0946 08/15/18 0613  INR 0.9* 1.07   Cardiac Enzymes: Recent Labs  Lab 08/15/18 0014 08/15/18 0613 08/15/18 1310  TROPONINI 0.09* 0.10* 0.09*   CBG: Recent Labs  Lab 08/15/18 0014 08/15/18 0807 08/15/18 1134  GLUCAP 233* 182* 314*    No results found for this or any previous visit (from the past 240 hour(s)).       Radiology  Studies: Dg Chest Portable 1 View  Result Date: 08/14/2018 CLINICAL DATA:  82 y/o M; shortness of breath, hypoxia, history of CABG. Former smoker. EXAM: PORTABLE CHEST 1 VIEW COMPARISON:  05/19/2018 chest radiograph. FINDINGS: Stable cardiomegaly given projection and technique. Post median sternotomy with wires in alignment. Mitral valve annuloplasty. Calcific aortic atherosclerosis. Diffuse reticular opacities of the lungs. Possible small left effusion. No acute osseous abnormality is evident. IMPRESSION: 1. Interstitial pulmonary edema and possible small left effusion. 2. Cardiomegaly.  Aortic Atherosclerosis (ICD10-I70.0). Electronically Signed   By: Kristine Garbe M.D.   On: 08/14/2018 15:46        Scheduled Meds: . albuterol  2.5 mg Nebulization Q4H  . arformoterol  15 mcg Nebulization BID  . budesonide  0.5 mg Nebulization BID  . digoxin  0.125 mg Oral Daily  . diltiazem  240 mg Oral Daily  . furosemide  60 mg Intravenous Q12H  . insulin aspart  0-9 Units Subcutaneous TID WC  . insulin glargine  5 Units Subcutaneous QHS  . [START ON 08/16/2018] mouth rinse  15 mL Mouth Rinse BID  . pravastatin  40 mg Oral q1800  . predniSONE  30 mg Oral Q breakfast  . umeclidinium bromide  1 puff Inhalation Daily  . warfarin  5 mg Oral ONCE-1800  . Warfarin - Pharmacist Dosing Inpatient   Does not apply q1800   Continuous Infusions:   LOS: 0 days     Vernell Leep, MD, FACP, [facility] Pager [phone]  If 7PM-7AM, please contact night-coverage [email or web address] Password [facility] 08/15/2018, 4:46 PM

## 2018-08-15 NOTE — CV Procedure (Signed)
Echocardiogram was attempted twice but patient was in chair eating. Will attempt on 08/16/18.

## 2018-08-15 NOTE — Progress Notes (Signed)
ANTICOAGULATION CONSULT NOTE - Follow Up Consult  Pharmacy Consult for Warfarin, Heparin bridge Indication: atrial fibrillation  No Known Allergies  Patient Measurements: Height: 5\' 8"  (172.7 cm) Weight: 176 lb 12.9 oz (80.2 kg) IBW/kg (Calculated) : 68.4  Vital Signs: Temp: 98 F (36.7 C) (11/27 1523) Temp Source: Oral (11/27 1523) BP: 126/73 (11/27 1523) Pulse Rate: 75 (11/27 1523)  Labs: Recent Labs    08/14/18 0946 08/14/18 1714 08/15/18 0014 08/15/18 0613 08/15/18 1310  HGB  --  13.5  --  13.4  --   HCT  --  44.1  --  46.1  --   PLT  --  158  --  149*  --   LABPROT  --   --   --  13.8  --   INR 0.9*  --   --  1.07  --   CREATININE  --  1.38*  --  1.32*  --   TROPONINI  --   --  0.09* 0.10* 0.09*    Estimated Creatinine Clearance: 43.2 mL/min (A) (by C-G formula based on SCr of 1.32 mg/dL (H)).   Medications:  PTA warfarin dosing 2.5 mg M/W/F and 5 mg other days.   Assessment: 82 y/o M with a h/o mitral valve repair, embolic CVA , and PAF admitted with SOB.   08/15/18  INR 1.07  CBC stable  No bleeding reported  PM Update: add IV heparin bridge until INR therapeuitc  Goal of Therapy:  INR 2-3  Heparin level 0.3-0.7   Plan:   Heparin 2000 units IV bolus  Heparin infusion 1200 units/hr  Check heparin level in 8hrs  Daily heparin level and CBC  Peggyann Juba, PharmD, BCPS Pager: [phone] 08/15/2018,5:16 PM

## 2018-08-15 NOTE — Evaluation (Signed)
Physical Therapy Evaluation Patient Details Name: [name] MRN: [id number] DOB: 1936-03-13 Today's Date: 08/15/2018   History of Present Illness  82 year old male, , PMH of PAF on Coumadin, CAD, chronic diastolic CHF, chronic hypoxic respiratory failure on home oxygen 2-3 L/min continuously, stage III chronic kidney disease, COPD , DM 2, HLD, embolic CVA, MVR, HTN, presented to ED with worsening of chronic dyspnea, orthopnea, decompensated heart failure  Clinical Impression  The patient ambulated x 35' with noted SOB, sats  Down to 88% on 4 L . HR 114. RN aware. Pt admitted with above diagnosis. Pt currently with functional limitations due to the deficits listed below (see PT Problem List).  Pt will benefit from skilled PT to increase their independence and safety with mobility to allow discharge to the venue listed below.       Follow Up Recommendations Home health PT    Equipment Recommendations  None recommended by PT    Recommendations for Other Services       Precautions / Restrictions Precautions Precautions: Fall Precaution Comments: on O2 chronic      Mobility  Bed Mobility               General bed mobility comments: in recliner  Transfers Overall transfer level: Needs assistance Equipment used: Rolling walker (2 wheeled) Transfers: Sit to/from Stand Sit to Stand: Min guard            Ambulation/Gait Ambulation/Gait assistance: Min Web designer (Feet): 35 Feet Assistive device: Rolling walker (2 wheeled) Gait Pattern/deviations: Step-through pattern;Trunk flexed     General Gait Details: cues for safety, patient noted to be  with increased dyspnea3/4 before getting back to recliner after 25  ft. patient on 4 l Wisdom encouraged pursed lip breaths  Stairs            Wheelchair Mobility    Modified Rankin (Stroke Patients Only)       Balance                                             Pertinent  Vitals/Pain Pain Assessment: No/denies pain    Home Living Family/patient expects to be discharged to:: Private residence Living Arrangements: Spouse/significant other Available Help at Discharge: Family;Available 24 hours/day Type of Home: House Home Access: Stairs to enter   [employer] of Steps: 2 Home Layout: Two level;Full bath on main level Home Equipment: Walker - 2 wheels;Wheelchair - manual      Prior Function Level of Independence: Independent               Hand Dominance        Extremity/Trunk Assessment   Upper Extremity Assessment Upper Extremity Assessment: Generalized weakness    Lower Extremity Assessment Lower Extremity Assessment: Generalized weakness       Communication   Communication: No difficulties  Cognition Arousal/Alertness: Awake/alert Behavior During Therapy: WFL for tasks assessed/performed Overall Cognitive Status: Within Functional Limits for tasks assessed                                        General Comments      Exercises General Exercises - Lower Extremity Long Arc Quad: AROM;Both;10 reps;Seated   Assessment/Plan    PT Assessment Patient needs continued PT services  PT Problem List Decreased strength;Decreased activity tolerance;Decreased mobility;Cardiopulmonary status limiting activity;Decreased knowledge of precautions;Decreased safety awareness;Decreased knowledge of use of DME       PT Treatment Interventions DME instruction;Gait training;Functional mobility training;Therapeutic activities;Therapeutic exercise;Patient/family education    PT Goals (Current goals can be found in the Care Plan section)  Acute Rehab PT Goals Patient Stated Goal: to go home PT Goal Formulation: With patient Time For Goal Achievement: 08/29/18 Potential to Achieve Goals: Good    Frequency Min 2X/week   Barriers to discharge        Co-evaluation               AM-PAC PT "6 Clicks" Mobility   Outcome Measure Help needed turning from your back to your side while in a flat bed without using bedrails?: Total Help needed moving from lying on your back to sitting on the side of a flat bed without using bedrails?: Total Help needed moving to and from a bed to a chair (including a wheelchair)?: A Lot Help needed standing up from a chair using your arms (e.g., wheelchair or bedside chair)?: A Lot Help needed to walk in hospital room?: A Lot Help needed climbing 3-5 steps with a railing? : Total 6 Click Score: 9    End of Session Equipment Utilized During Treatment: Oxygen Activity Tolerance: Treatment limited secondary to medical complications (Comment)(DOE) Patient left: in chair;with call bell/phone within reach;with chair alarm set Nurse Communication: Mobility status PT Visit Diagnosis: Unsteadiness on feet (R26.81)    Time: 1700-1713 PT Time Calculation (min) (ACUTE ONLY): 13 min   Charges:   PT Evaluation $PT Eval Low Complexity: [place] Pager [phone] Office [phone]   Claretha Cooper 08/15/2018, 5:29 PM

## 2018-08-15 NOTE — Progress Notes (Signed)
ANTICOAGULATION CONSULT NOTE - Follow Up Consult  Pharmacy Consult for Warfarin Indication: atrial fibrillation  No Known Allergies  Patient Measurements: Height: 5\' 8"  (172.7 cm) Weight: 176 lb 12.9 oz (80.2 kg) IBW/kg (Calculated) : 68.4  Vital Signs: Temp: 98.2 F (36.8 C) (11/27 0440) Temp Source: Oral (11/27 0440) BP: 121/73 (11/27 0859) Pulse Rate: 116 (11/27 0859)  Labs: Recent Labs    08/14/18 0946 08/14/18 1714 08/15/18 0014 08/15/18 0613  HGB  --  13.5  --  13.4  HCT  --  44.1  --  46.1  PLT  --  158  --  149*  LABPROT  --   --   --  13.8  INR 0.9*  --   --  1.07  CREATININE  --  1.38*  --  1.32*  TROPONINI  --   --  0.09* 0.10*    Estimated Creatinine Clearance: 43.2 mL/min (A) (by C-G formula based on SCr of 1.32 mg/dL (H)).   Medications:  PTA warfarin dosing 2.5 mg M/W/F and 5 mg other days.   Assessment: 82 y/o M with a h/o mitral valve repair, embolic CVA , and PAF admitted with SOB.   08/15/18  INR 1.07  CBC stable  No bleeding reported  Goal of Therapy:  INR 2-3   Plan:  Warfarin 5 mg tonight.  Daily INR.   Ulice Dash D 08/15/2018,11:13 AM

## 2018-08-15 NOTE — Progress Notes (Signed)
CRITICAL VALUE ALERT  Critical Value:  Troponin 0.09  Date & Time Notied:  08/15/18 0130  Provider Notified: K.Schorr  Orders Received/Actions taken: pending

## 2018-08-16 ENCOUNTER — Inpatient Hospital Stay (HOSPITAL_COMMUNITY): Payer: Medicare Other

## 2018-08-16 DIAGNOSIS — I472 Ventricular tachycardia: Secondary | ICD-10-CM

## 2018-08-16 DIAGNOSIS — N179 Acute kidney failure, unspecified: Secondary | ICD-10-CM

## 2018-08-16 DIAGNOSIS — Z9889 Other specified postprocedural states: Secondary | ICD-10-CM

## 2018-08-16 DIAGNOSIS — J438 Other emphysema: Secondary | ICD-10-CM

## 2018-08-16 DIAGNOSIS — R0602 Shortness of breath: Secondary | ICD-10-CM

## 2018-08-16 DIAGNOSIS — N189 Chronic kidney disease, unspecified: Secondary | ICD-10-CM

## 2018-08-16 DIAGNOSIS — I2510 Atherosclerotic heart disease of native coronary artery without angina pectoris: Secondary | ICD-10-CM

## 2018-08-16 DIAGNOSIS — I4819 Other persistent atrial fibrillation: Secondary | ICD-10-CM

## 2018-08-16 DIAGNOSIS — E1165 Type 2 diabetes mellitus with hyperglycemia: Secondary | ICD-10-CM

## 2018-08-16 DIAGNOSIS — I5033 Acute on chronic diastolic (congestive) heart failure: Secondary | ICD-10-CM

## 2018-08-16 DIAGNOSIS — J9621 Acute and chronic respiratory failure with hypoxia: Secondary | ICD-10-CM

## 2018-08-16 DIAGNOSIS — I10 Essential (primary) hypertension: Secondary | ICD-10-CM

## 2018-08-16 LAB — DIGOXIN LEVEL: Digoxin Level: 1.3 ng/mL (ref 0.8–2.0)

## 2018-08-16 LAB — CBC
HEMATOCRIT: 39.5 % (ref 39.0–52.0)
HEMOGLOBIN: 12.6 g/dL — AB (ref 13.0–17.0)
MCH: 27.5 pg (ref 26.0–34.0)
MCHC: 31.9 g/dL (ref 30.0–36.0)
MCV: 86.2 fL (ref 80.0–100.0)
Platelets: 129 10*3/uL — ABNORMAL LOW (ref 150–400)
RBC: 4.58 MIL/uL (ref 4.22–5.81)
RDW: 15.9 % — ABNORMAL HIGH (ref 11.5–15.5)
WBC: 10.6 10*3/uL — AB (ref 4.0–10.5)
nRBC: 0.2 % (ref 0.0–0.2)

## 2018-08-16 LAB — BASIC METABOLIC PANEL
Anion gap: 10 (ref 5–15)
BUN: 27 mg/dL — ABNORMAL HIGH (ref 8–23)
CO2: 29 mmol/L (ref 22–32)
Calcium: 8.9 mg/dL (ref 8.9–10.3)
Chloride: 97 mmol/L — ABNORMAL LOW (ref 98–111)
Creatinine, Ser: 1.49 mg/dL — ABNORMAL HIGH (ref 0.61–1.24)
GFR calc non Af Amer: 44 mL/min — ABNORMAL LOW (ref >60)
GFR, EST AFRICAN AMERICAN: 51 mL/min — AB (ref >60)
Glucose, Bld: 199 mg/dL — ABNORMAL HIGH (ref 70–99)
Potassium: 3.5 mmol/L (ref 3.5–5.1)
Sodium: 136 mmol/L (ref 135–145)

## 2018-08-16 LAB — HEPARIN LEVEL (UNFRACTIONATED)
HEPARIN UNFRACTIONATED: 0.12 [IU]/mL — AB (ref 0.30–0.70)
Heparin Unfractionated: 0.3 IU/mL (ref 0.30–0.70)
Heparin Unfractionated: 0.17 IU/mL — ABNORMAL LOW (ref 0.30–0.70)

## 2018-08-16 LAB — TROPONIN I
Troponin I: 0.09 ng/mL (ref <0.03)
Troponin I: 0.08 ng/mL (ref <0.03)
Troponin I: 0.07 ng/mL (ref <0.03)

## 2018-08-16 LAB — GLUCOSE, CAPILLARY
GLUCOSE-CAPILLARY: 187 mg/dL — AB (ref 70–99)
Glucose-Capillary: 213 mg/dL — ABNORMAL HIGH (ref 70–99)
Glucose-Capillary: 223 mg/dL — ABNORMAL HIGH (ref 70–99)

## 2018-08-16 LAB — MAGNESIUM: Magnesium: 2.2 mg/dL (ref 1.7–2.4)

## 2018-08-16 LAB — ECHOCARDIOGRAM COMPLETE
HEIGHTINCHES: 68 in
WEIGHTICAEL: 2624.36 [oz_av]

## 2018-08-16 LAB — PROTIME-INR: Prothrombin Time: <10 seconds — ABNORMAL LOW (ref 11.4–15.2)

## 2018-08-16 MED ORDER — CALCIUM CARBONATE ANTACID 500 MG PO CHEW
400.0000 mg | CHEWABLE_TABLET | Freq: Three times a day (TID) | ORAL | Status: DC | PRN
  Administered 2018-08-16 – 2018-08-20 (×2): 400 mg via ORAL
  Filled 2018-08-16 (×2): qty 2

## 2018-08-16 MED ORDER — WARFARIN SODIUM 5 MG PO TABS
5.0000 mg | ORAL_TABLET | Freq: Once | ORAL | Status: AC
  Administered 2018-08-16: 5 mg via ORAL
  Filled 2018-08-16: qty 1

## 2018-08-16 MED ORDER — FUROSEMIDE 10 MG/ML IJ SOLN
INTRAMUSCULAR | Status: AC
  Administered 2018-08-16: 40 mg via INTRAVENOUS
  Filled 2018-08-16: qty 4

## 2018-08-16 MED ORDER — INSULIN ASPART 100 UNIT/ML ~~LOC~~ SOLN
0.0000 [IU] | Freq: Three times a day (TID) | SUBCUTANEOUS | Status: DC
  Administered 2018-08-16: 9 [IU] via SUBCUTANEOUS
  Administered 2018-08-17: 3 [IU] via SUBCUTANEOUS
  Administered 2018-08-17: 1 [IU] via SUBCUTANEOUS
  Administered 2018-08-17 – 2018-08-18 (×2): 7 [IU] via SUBCUTANEOUS
  Administered 2018-08-18: 2 [IU] via SUBCUTANEOUS
  Administered 2018-08-19: 3 [IU] via SUBCUTANEOUS
  Administered 2018-08-19 – 2018-08-20 (×2): 2 [IU] via SUBCUTANEOUS

## 2018-08-16 MED ORDER — HEPARIN BOLUS VIA INFUSION
2000.0000 [IU] | Freq: Once | INTRAVENOUS | Status: AC
  Administered 2018-08-16: 2000 [IU] via INTRAVENOUS
  Filled 2018-08-16: qty 2000

## 2018-08-16 MED ORDER — POTASSIUM CHLORIDE CRYS ER 20 MEQ PO TBCR
40.0000 meq | EXTENDED_RELEASE_TABLET | Freq: Once | ORAL | Status: AC
  Administered 2018-08-16: 40 meq via ORAL
  Filled 2018-08-16: qty 2

## 2018-08-16 MED ORDER — HEPARIN (PORCINE) 25000 UT/250ML-% IV SOLN
1650.0000 [IU]/h | INTRAVENOUS | Status: DC
  Administered 2018-08-16 – 2018-08-17 (×2): 1550 [IU]/h via INTRAVENOUS
  Filled 2018-08-16 (×5): qty 250

## 2018-08-16 MED ORDER — INSULIN ASPART 100 UNIT/ML ~~LOC~~ SOLN
0.0000 [IU] | Freq: Every day | SUBCUTANEOUS | Status: DC
  Administered 2018-08-16 – 2018-08-19 (×2): 2 [IU] via SUBCUTANEOUS

## 2018-08-16 MED ORDER — FUROSEMIDE 10 MG/ML IJ SOLN
40.0000 mg | Freq: Once | INTRAMUSCULAR | Status: AC
  Administered 2018-08-16: 40 mg via INTRAVENOUS

## 2018-08-16 MED ORDER — HEPARIN BOLUS VIA INFUSION
2200.0000 [IU] | Freq: Once | INTRAVENOUS | Status: AC
  Administered 2018-08-16: 2200 [IU] via INTRAVENOUS
  Filled 2018-08-16: qty 2200

## 2018-08-16 NOTE — Progress Notes (Addendum)
PROGRESS NOTE   CORRION [name]  OJJ:[id number]    DOB: July 10, 1936    DOA: 08/14/2018  PCP: Tammi Sou, MD   I have briefly reviewed patients previous medical records in [facility].  Brief Narrative:  82 year old male, lives at home with his spouse, independent, usually sleeps on recliner, PMH of PAF on Coumadin, CAD, chronic diastolic CHF, chronic hypoxic respiratory failure on home oxygen 2-3 L/min continuously, stage III chronic kidney disease, COPD on chronic prednisone, DM 2, HLD, embolic CVA, MVR, HTN, presented to ED with worsening of chronic dyspnea, orthopnea, PND, has chronic leg edema, intermittent productive cough of brown sputum and occasional specks of blood.  Admitted for decompensated CHF and acute on chronic hypoxic respiratory failure.  Overnight 11/27, had episode of exertional chest pain, worsening dyspnea, NSVT versus A. fib with aberrancy on monitor, received additional Lasix 80 mg IV x1, creatinine has further increased from 1.3-1.49.  Cardiology consulted for assistance.   Assessment & Plan:   Active Problems:   Essential hypertension, benign   CAD (coronary artery disease)   COPD (chronic obstructive pulmonary disease) with emphysema (HCC)   History of mitral valve repair   Atrial fibrillation (HCC)   Acute on chronic respiratory failure with hypoxia (HCC)   Chronic congestive heart failure (HCC)   DM2 (diabetes mellitus, type 2) (HCC)   Acute exacerbation of congestive heart failure (HCC)   HLD (hyperlipidemia)   CKD (chronic kidney disease) stage 3, GFR 30-59 ml/min (HCC)   Leukocytosis   Acute on chronic diastolic CHF (congestive heart failure) (HCC)   Acute on chronic diastolic CHF: TTE 9/37: LVEF 60-65%, severe LVH, post MV repair and PA peak pressure 72 mmHg.  Reports compliance with medications and daily weights.  Unclear etiology.  Started on IV Lasix 60 mg every 12 hours.  TTE 11/28: LVEF 50-55%, unable to evaluate LV diastolic function, RV  pressure overload.  Overnight 11/27, received additional IV Lasix 80 mg for dyspnea and chest pain.  -1064 mL thus far.  Creatinine has gone up from 1.3-1.5.  Still appears volume overloaded.  However holding Lasix due to worsening renal insufficiency, until cardiology input.  Acute on chronic hypoxic respiratory failure: Likely due to decompensated CHF complicating underlying COPD and pulmonary hypertension.  Treat CHF as above and wean oxygen to prior levels as tolerated.  Had oxygen saturation of 84% per EMS.  Currently saturating in the low 90s on 3 L/min oxygen.  COPD: No clinical bronchospasm.  Oxygen supplements and as needed bronchodilator nebulizations.  Continue chronic prednisone 30 mg daily, seems to be a high maintenance dose and may consider gradually tapering his outpatient.  Essential hypertension: Controlled.  Hypokalemia: Replace and follow as needed.  Elevated troponin: Mild and flat trend.  Likely related to demand ischemia from decompensated CHF, respiratory failure and chronic kidney disease.  Due to overnight episode of chest pain, repeated troponin and flat/no change.  Paroxysmal A. Fib: CHA2DS2VAsc 6.  Rate controlled.  Continue digoxin, Cardizem.  INR subtherapeutic.  Coumadin & heparin per pharmacy.  However given prior history of embolic strokes, and high embolic risk, IV heparin bridging until INR therapeutic.  NSVT versus A. fib with aberrancy: Noted overnight 11/27.  Continue telemetry.  Attempt to keep potassium greater than 4 and magnesium greater than 2.  Check digoxin level.  Cardiology consulted.  CAD: Had angina-like symptoms overnight.  Troponin flat.  Management as above.  Hyperlipidemia: Continue statins.  DM2: A1c 8.8 on 07/24/2018 indicating poor  control.  Increased Lantus to 10 units at bedtime.  Continue SSI.  Better.  Monitor closely and adjust insulins as needed.  Acute Kidney Injury complicating stage III chronic kidney disease: Recent baseline  creatinine 1-1.2.  Creatinine on admission 1.3 which has now increased to 1.5, likely multifactorial due to cardiorenal and aggressive diuresis.  Held Lasix this morning.  Await cardiology input regarding continue diuresis.  History of embolic CVA: Anticoagulation discussion as above.  Hemoptysis, mild: Likely related to CHF/pulmonary edema.  Monitor closely while on anticoagulation as discussed.  No change.  Leukocytosis: Suspect due to chronic steroids.  No clinical concern for infection.  Resolved.  Thrombocytopenia: Monitor closely.    DVT prophylaxis: IV heparin bridging until INR therapeutic on Coumadin. Code Status: DNR Family Communication: None at bedside today. Disposition: To be determined pending clinical improvement.   Consultants:  Cardiology  Procedures:  None  Antimicrobials:  None   Subjective: Overnight events noted.  Patient is not a great historian.  Discussed with RN.  Reportedly had exertional chest pain overnight when ambulating to bathroom, associated dyspnea.  Received Lasix 40 mg IV x2 doses.  Improved.  Currently without chest pain.  Reports dyspnea has also improved but not at baseline.  Still coughing up some blood.  ROS: Stays with spouse and does not use any assistance for walking.  Otherwise negative.  Objective:  Vitals:   08/16/18 0602 08/16/18 0944 08/16/18 0948 08/16/18 1158  BP:    122/68  Pulse:    81  Resp:    (!) 30  Temp:    98.8 F (37.1 C)  TempSrc:    Oral  SpO2:  93% 93% 92%  Weight: 74.4 kg     Height: 5\' 8"  (1.727 m)       Examination:  General exam: Elderly male, moderately built and nourished, lying comfortably propped up in bed, undergoing TTE by tech. Respiratory system: Diminished breath sounds in the bases with few bibasilar crackles.  Rest of lung fields clear without wheezing or rhonchi.  No increased work of breathing. Cardiovascular system: S1 & S2 heard, irregularly irregular. No murmurs, rubs, gallops or  clicks. No pedal edema but has trace presacral edema.  JVD +.  Telemetry personally reviewed: A. fib with BBB morphology with ventricular rate mostly in the 100s.  NSVT versus A. fib with aberrancy at midnight. Gastrointestinal system: Abdomen is nondistended, soft and nontender. No organomegaly or masses felt. Normal bowel sounds heard.  Stable Central nervous system: Alert and oriented. No focal neurological deficits.  Stable Extremities: Symmetric 5 x 5 power. Skin: No rashes, lesions or ulcers Psychiatry: Judgement and insight appears impaired. Mood & affect appropriate.     Data Reviewed: I have personally reviewed following labs and imaging studies  CBC: Recent Labs  Lab 08/14/18 1714 08/15/18 0613 08/16/18 0217  WBC 14.0* 13.8* 10.6*  NEUTROABS 11.4*  --   --   HGB 13.5 13.4 12.6*  HCT 44.1 46.1 39.5  MCV 88.7 92.0 86.2  PLT 158 149* 025*   Basic Metabolic Panel: Recent Labs  Lab 08/14/18 1714 08/15/18 0613 08/16/18 0217  NA 140 139 136  K 3.7 3.4* 3.5  CL 99 99 97*  CO2 31 29 29   GLUCOSE 259* 177* 199*  BUN 17 18 27*  CREATININE 1.38* 1.32* 1.49*  CALCIUM 9.2 8.8* 8.9   Coagulation Profile: Recent Labs  Lab 08/14/18 0946 08/15/18 0613 08/16/18 0217  INR 0.9* 1.07 NOT CALCULATED   Cardiac Enzymes: Recent  Labs  Lab 08/15/18 0014 08/15/18 0613 08/15/18 1310 08/16/18 1006  TROPONINI 0.09* 0.10* 0.09* 0.09*   CBG: Recent Labs  Lab 08/15/18 1134 08/15/18 1655 08/15/18 2055 08/16/18 0731 08/16/18 1133  GLUCAP 314* 300* 248* 187* 213*    No results found for this or any previous visit (from the past 240 hour(s)).       Radiology Studies: Dg Chest Portable 1 View  Result Date: 08/14/2018 CLINICAL DATA:  82 y/o M; shortness of breath, hypoxia, history of CABG. Former smoker. EXAM: PORTABLE CHEST 1 VIEW COMPARISON:  05/19/2018 chest radiograph. FINDINGS: Stable cardiomegaly given projection and technique. Post median sternotomy with wires in  alignment. Mitral valve annuloplasty. Calcific aortic atherosclerosis. Diffuse reticular opacities of the lungs. Possible small left effusion. No acute osseous abnormality is evident. IMPRESSION: 1. Interstitial pulmonary edema and possible small left effusion. 2. Cardiomegaly.  Aortic Atherosclerosis (ICD10-I70.0). Electronically Signed   By: Kristine Garbe M.D.   On: 08/14/2018 15:46        Scheduled Meds: . arformoterol  15 mcg Nebulization BID  . budesonide  0.5 mg Nebulization BID  . digoxin  0.125 mg Oral Daily  . diltiazem  240 mg Oral Daily  . insulin aspart  0-9 Units Subcutaneous TID WC  . insulin glargine  10 Units Subcutaneous QHS  . mouth rinse  15 mL Mouth Rinse BID  . pravastatin  40 mg Oral q1800  . predniSONE  30 mg Oral Q breakfast  . umeclidinium bromide  1 puff Inhalation Daily  . warfarin  5 mg Oral ONCE-1800  . Warfarin - Pharmacist Dosing Inpatient   Does not apply q1800   Continuous Infusions: . heparin 1,550 Units/hr (08/16/18 1129)     LOS: 1 day     Vernell Leep, MD, FACP, [facility]. [facility] Pager [phone]  If 7PM-7AM, please contact night-coverage [email or web address] Password [email or web address] 08/16/2018, 1:45 PM

## 2018-08-16 NOTE — Consult Note (Signed)
Cardiology Consultation:   Patient ID: [name] MRN: [id number]; DOB: 01-Jun-1936  Admit date: 08/14/2018 Date of Consult: 08/16/2018  Primary Care Provider: Tammi Sou, MD Primary Cardiologist: No primary care provider on file. Crenshaw Primary Electrophysiologist:  None    Patient Profile:   MAVERIC [name] is a 82 y.o. male with a hx of MV repair, diastolic HF, recurrent paroxysmal and persistent AFib, PAH, nonobstructive CAD, chronic hypoxic respiratory failure on O2 due to emphysema, obesity-hypoventilation sd., small AAA, remote embolic stroke (endocarditis 2008), CKD 3, hx of prostate Ca who is being seen today for the evaluation of CHF at the request of Dr. Algis Liming.  History of Present Illness:   [name] was admitted on November 26 with acute on chronic hypoxic respiratory failure and cough productive of yellowish-brown sputum, minimal hemoptysis, in the setting of atrial fibrillation with controlled ventricular rate.  He has chronic pedal edema, not worse prior to the admission.  It does not appear that he had fever.  His white blood cell count was elevated at 14,000 but he is on chronic steroid therapy.  His BNP was very low at 98.  His chest x-ray showed interstitial pulmonary edema and a small left pleural effusion.  Usual weight as outpatient is 185-191 lb this year.  Admission weight was documented at 177 pounds, had been documented earlier the same day at 186lb. According to the hospital scale weight has decreased further down to 164 pounds today (I find it very difficult to reconcile the huge variation in weight.).  By in/out he has had a net diuresis of 1 L.  He denies frank orthopnea or PND; he is unaware of palpitations and denies syncope or focal neurologic complaints; he has not had recent bleeding or falls.  CHA2DS2VAsc 6.  He has had a previous embolic stroke.  On arrival his INR was only 0.9.  He had 2 large servings of collard greens last week.  Has  been taking his warfarin as usual.   Past Medical History:  Diagnosis Date  . Adrenal adenoma   . Atrial fibrillation (HCC)    Rate control + Coumadin  . Atrial flutter ([place])   . CAD (coronary artery disease)    Lexiscan Myoview (01/2014): Lateral soft tissue attenuation, no ischemia, not gated, low risk  . Cancer ([place])   . CAP (community acquired pneumonia)    Hospitalized 10/2014  . Chronic diastolic heart failure ([place])   . Chronic hypoxemic respiratory failure (HCC)    from COPD  . Chronic renal insufficiency, stage III (moderate) (HCC)    CrCl 50s  . COPD (chronic obstructive pulmonary disease) (HCC)    oxygen-dependent (2.5 L 24/7).  Improved on starting chronic prednisone 06/2017 (increased to 20 mg qd 08/2017)  Progressive worsening dyspnea through 2019: 3 L oxygen, 30mg  prednisone qd chronically as of 07/2018.  . Diabetes mellitus without complication ([place]) 40/98/1191   A1c 6.5 % 05/2016  . Diverticulosis   . DOE (dyspnea on exertion)    Multifactorial: deconditioning, COPD, obesity hypoventilation syndrome.  Marland Kitchen Dyslipidemia   . Embolic stroke ([place])    d/t endocarditis 2008  . Hemorrhoids   . History of mitral valve replacement    needs SBE prophylaxis  . History of prostate cancer remote past   f/u by Dr. Claris Che; no recurrence as of f/u 10/2015.  PSA rising from 2013 to 04/2018 (PSA 04/25/18=18.49): urol to rpt PSA and check CT and bone scan  approx 10/2018.  Marland Kitchen HTN (  hypertension)   . Left lower lobe pulmonary nodule July/Aug 2019.   04/2018: enlarging irregular nodular opacity.  CT surgery-->PET scan: inflamm/post-infect vs malignant. Plan f/u CT chest 3 mo per CT surg.  . Mediastinal lymphadenopathy   . Pulmonary hypertension ([place]) 01/09/2015   secondary  . Right thyroid nodule   . Thrombocytopenia ([place])   . TIA (transient ischemic attack) 03/25/2011   ? of due to transient diplopia  . Ulcerative colitis    left-sided/segmental, associated with diverticulosis.   Sulfasalazine per GI (Dr. Carlean Purl).  . Warthin's tumor     Past Surgical History:  Procedure Laterality Date  . carotid duplex dopplers  04/2014   No signif plaques; repeat 2 yrs per cardiology  . CATARACT EXTRACTION, BILATERAL    . COLONOSCOPY W/ BIOPSIES  12/19/2007   left-sided colitis, diverticulosis, hemorrhoids  . FLEXIBLE SIGMOIDOSCOPY  01/11/2008; 03/19/2010   2009 and 2011:segmental left colitis, diverticulosis, hemorrhoids  . INGUINAL HERNIA REPAIR     right  . MITRAL VALVE REPAIR  09/22/04   and modified Cox-Maze IV   . MITRAL VALVE REPAIR  09/14/05   redo MV repair d/t endocardiits/embolic events  . PROSTATECTOMY  06/2003   Radical  . RIGHT HEART CATHETERIZATION N/A 01/01/2015   Procedure: RIGHT HEART CATH;  Surgeon: Peter M Martinique, MD;  Location: [facility] CATH LAB;  Service: Cardiovascular;  Laterality: N/A;  . SALIVARY GLAND SURGERY     left resection  . TRANSTHORACIC ECHOCARDIOGRAM  11/2013; 07/2017   2015: Mod LVH, EF 55-60%, no WM abnormalities, +LA dilation, +severe RV dilation and impaired systolic fxn, +increased pulm art pressures.  Valves ok.  2018- EF 60-65%, wall motion nl, s/p MV repair, mild RV syst dysf.      Home Medications:  Prior to Admission medications   Medication Sig Start Date End Date Taking? Authorizing Provider  albuterol (PROVENTIL HFA;VENTOLIN HFA) 108 (90 Base) MCG/ACT inhaler INHALE ONE PUFF BY MOUTH EVERY 6 HOURS AS NEEDED FOR WHEEZING AND FOR SHORTNESS OF BREATH (USING 5-6 TIMES EVERY 24 HOURS) Patient taking differently: Inhale 1 puff into the lungs every 6 (six) hours as needed for wheezing or shortness of breath.  01/01/18  Yes Collene Gobble, MD  albuterol (PROVENTIL) (2.5 MG/3ML) 0.083% nebulizer solution Take 3 mLs (2.5 mg total) by nebulization every 4 (four) hours. And as needed 12/29/17  Yes Byrum, Rose Fillers, MD  budesonide (PULMICORT) 0.5 MG/2ML nebulizer solution USE 1 VIAL IN NEBULIZER TWICE DAILY 06/25/18  Yes Byrum, Rose Fillers, MD  digoxin  (LANOXIN) 0.125 MG tablet Take 1 tablet (0.125 mg total) by mouth daily. 01/10/18  Yes Lelon Perla, MD  diltiazem (CARDIZEM CD) 240 MG 24 hr capsule Take 1 capsule (240 mg total) by mouth daily. 01/10/18  Yes Lelon Perla, MD  furosemide (LASIX) 40 MG tablet Take 1.5 tablets (60 mg total) by mouth daily. 07/17/18 08/16/18 Yes Lelon Perla, MD  glipiZIDE (GLUCOTROL XL) 10 MG 24 hr tablet Take 1 tablet (10 mg total) by mouth daily with breakfast. 07/25/18  Yes McGowen, Adrian Blackwater, MD  LORazepam (ATIVAN) 0.5 MG tablet Take 1 tablet by mouth daily as needed for anxiety.  01/31/18  Yes [provider]  pravastatin (PRAVACHOL) 40 MG tablet TAKE 1 TABLET EVERY EVENING Patient taking differently: Take 40 mg by mouth daily. TAKE 1 TABLET EVERY EVENING 01/10/18  Yes Crenshaw, Denice Bors, MD  predniSONE (DELTASONE) 20 MG tablet Take 1.5 tablets (30 mg total) by mouth daily with breakfast.  07/24/18  Yes Byrum, Rose Fillers, MD  STIOLTO RESPIMAT 2.5-2.5 MCG/ACT AERS INHALE 2 PUFFS INTO THE LUNGS DAILY. Patient taking differently: Inhale 2 puffs into the lungs daily.  09/20/17  Yes Collene Gobble, MD  warfarin (COUMADIN) 5 MG tablet Take 1/2 to 1 tablet by mouth daily as directed Patient taking differently: Take 2.5-5 mg by mouth as directed. Take 1/2 tablet (2.5 mg) on MWF & Take 1 tablet (5 mg) all other days. 06/26/18  Yes Lelon Perla, MD  budesonide (PULMICORT) 0.5 MG/2ML nebulizer solution Take 2 mLs (0.5 mg total) by nebulization 2 (two) times daily. Patient not taking: Reported on 08/14/2018 05/28/18   Collene Gobble, MD    Inpatient Medications: Scheduled Meds: . arformoterol  15 mcg Nebulization BID  . budesonide  0.5 mg Nebulization BID  . digoxin  0.125 mg Oral Daily  . diltiazem  240 mg Oral Daily  . insulin aspart  0-9 Units Subcutaneous TID WC  . insulin glargine  10 Units Subcutaneous QHS  . mouth rinse  15 mL Mouth Rinse BID  . pravastatin  40 mg Oral q1800  . predniSONE  30  mg Oral Q breakfast  . umeclidinium bromide  1 puff Inhalation Daily  . warfarin  5 mg Oral ONCE-1800  . Warfarin - Pharmacist Dosing Inpatient   Does not apply q1800   Continuous Infusions: . heparin 1,550 Units/hr (08/16/18 1129)   PRN Meds: albuterol, calcium carbonate, guaiFENesin-dextromethorphan, LORazepam  Allergies:   No Known Allergies  Social History:   Social History   Socioeconomic History  . Marital status: Married    Spouse name: Jeanett Schlein  . Number of children: 2  . Years of education: Not on file  . Highest education level: Not on file  Occupational History  . Occupation: RETIRED    Comment: Careers adviser: RETIRED  Social Needs  . Financial resource strain: Not on file  . Food insecurity:    Worry: Not on file    Inability: Not on file  . Transportation needs:    Medical: Not on file    Non-medical: Not on file  Tobacco Use  . Smoking status: Former Smoker    Packs/day: 3.00    Years: 60.00    Pack years: 180.00    Types: Cigarettes    Last attempt to quit: 09/19/2002    Years since quitting: 15.9  . Smokeless tobacco: Never Used  Substance and Sexual Activity  . Alcohol use: No    Alcohol/week: 0.0 standard drinks  . Drug use: No  . Sexual activity: Not on file  Lifestyle  . Physical activity:    Days per week: Not on file    Minutes per session: Not on file  . Stress: Not on file  Relationships  . Social connections:    Talks on phone: Not on file    Gets together: Not on file    Attends religious service: Not on file    Active member of club or organization: Not on file    Attends meetings of clubs or organizations: Not on file    Relationship status: Not on file  . Intimate partner violence:    Fear of current or ex partner: Not on file    Emotionally abused: Not on file    Physically abused: Not on file    Forced sexual activity: Not on file  Other Topics Concern  . Not on file  Social History Narrative  Married, 2  children.   Retired Systems developer from [employer]   180 pack-year tob hx.  No alcohol.    Family History:    Family History  Problem Relation Age of Onset  . Cancer Mother        spinal  . Stroke Father   . Heart disease Father   . Colon cancer Neg Hx   . Heart attack Neg Hx      ROS:  Please see the history of present illness.   All other ROS reviewed and negative.     Physical Exam/Data:   Vitals:   08/16/18 0602 08/16/18 0944 08/16/18 0948 08/16/18 1158  BP:    122/68  Pulse:    81  Resp:    (!) 30  Temp:    98.8 F (37.1 C)  TempSrc:    Oral  SpO2:  93% 93% 92%  Weight: 74.4 kg     Height: 5\' 8"  (1.727 m)       Intake/Output Summary (Last 24 hours) at 08/16/2018 1200 Last data filed at 08/16/2018 1000 Gross per 24 hour  Intake 905.93 ml  Output 1500 ml  Net -594.07 ml   Filed Weights   08/14/18 2058 08/15/18 0441 08/16/18 0602  Weight: 80.8 kg 80.2 kg 74.4 kg   Body mass index is 24.94 kg/m.  General:  Well nourished, well developed, in no acute distress, slightly sleepy, but easily awoken and fully oriented HEENT: normal Lymph: no adenopathy Neck: no JVD Endocrine:  No thryomegaly Vascular: No carotid bruits; FA pulses 2+ bilaterally without bruits  Cardiac:  S1, S2, but very distant heart sounds, irregular rhythm; no murmur  Lungs:  clear to auscultation bilaterally, no wheezing, rhonchi or rales  Abd: soft, nontender, no hepatomegaly  Ext: no edema Musculoskeletal:  No deformities, BUE and BLE strength normal and equal Skin: warm and dry  Neuro:  CNs 2-12 intact, no focal abnormalities noted Psych:  Normal affect   EKG:  The EKG was personally reviewed and demonstrates: Atrial fibrillation, right bundle branch block, probable right ventricular hypertrophy Telemetry:  Telemetry was personally reviewed and demonstrates: Atrial fibrillation with gradually improving ventricular rate, currently in the 80s  Relevant CV Studies: Echo  08/16/2018  - Left ventricle: The cavity size was normal. There was mild   concentric hypertrophy. Systolic function was normal. The   estimated ejection fraction was in the range of 50% to 55%. Wall   motion was normal; there were no regional wall motion   abnormalities. The study is not technically sufficient to allow   evaluation of LV diastolic function. - Ventricular septum: Septal motion showed paradox. The contour   showed systolic flattening. These changes are consistent with RV   pressure overload. - Aortic valve: Right coronary cusp mobility was severely   restricted. There was trivial regurgitation. - Mitral valve: Prior procedures included surgical repair. An   annular ring prosthesis was present. Valve area by pressure   half-time: 1.76 cm^2. - Left atrium: The atrium was severely dilated. - Right ventricle: The cavity size was moderately dilated. Systolic   function was mildly reduced. PA pressure is likely underestimated   due to poor TR jet. - Right atrium: The atrium was severely dilated.  Right heart catheterization April 2016 showed a right atrial pressure of 8, PA pressure of 59/23 and pulmonary Wedge pressure of 18. Findings consistent with moderate pulmonary hypertension and upper normal left ventricular filling pressures.  Laboratory Data:  Chemistry Recent Labs  Lab 08/14/18 1714 08/15/18 0613 08/16/18 0217  NA 140 139 136  K 3.7 3.4* 3.5  CL 99 99 97*  CO2 31 29 29   GLUCOSE 259* 177* 199*  BUN 17 18 27*  CREATININE 1.38* 1.32* 1.49*  CALCIUM 9.2 8.8* 8.9  GFRNONAA 48* 51* 44*  GFRAA 56* 59* 51*  ANIONGAP 10 11 10     No results for input(s): PROT, ALBUMIN, AST, ALT, ALKPHOS, BILITOT in the last 168 hours. Hematology Recent Labs  Lab 08/14/18 1714 08/15/18 0613 08/16/18 0217  WBC 14.0* 13.8* 10.6*  RBC 4.97 5.01 4.58  HGB 13.5 13.4 12.6*  HCT 44.1 46.1 39.5  MCV 88.7 92.0 86.2  MCH 27.2 26.7 27.5  MCHC 30.6 29.1* 31.9  RDW 15.9* 16.2*  15.9*  PLT 158 149* 129*   Cardiac Enzymes Recent Labs  Lab 08/15/18 0014 08/15/18 0613 08/15/18 1310 08/16/18 1006  TROPONINI 0.09* 0.10* 0.09* 0.09*   No results for input(s): TROPIPOC in the last 168 hours.  BNP Recent Labs  Lab 08/14/18 1715  BNP 98.8    DDimer No results for input(s): DDIMER in the last 168 hours.  Radiology/Studies:  Dg Chest Portable 1 View  Result Date: 08/14/2018 CLINICAL DATA:  82 y/o M; shortness of breath, hypoxia, history of CABG. Former smoker. EXAM: PORTABLE CHEST 1 VIEW COMPARISON:  05/19/2018 chest radiograph. FINDINGS: Stable cardiomegaly given projection and technique. Post median sternotomy with wires in alignment. Mitral valve annuloplasty. Calcific aortic atherosclerosis. Diffuse reticular opacities of the lungs. Possible small left effusion. No acute osseous abnormality is evident. IMPRESSION: 1. Interstitial pulmonary edema and possible small left effusion. 2. Cardiomegaly.  Aortic Atherosclerosis (ICD10-I70.0). Electronically Signed   By: Kristine Garbe M.D.   On: 08/14/2018 15:46    Assessment and Plan:   1. Ac/Chr resp failure: Multifactorial related to obesity hypoventilation syndrome, emphysema, in addition to heart failure.  He has a very low threshold for decompensation with acute cardiac or respiratory illness.  He has had increased production of yellowish/brown sputum and is possible that a respiratory infection triggered the current episode. 2. Acute on chronic diastolic HF: This is likely related to increased heart rate and atrial fibrillation with increased gradients across the repaired mitral valve.  It will not be associated with elevated BNP since he will develop elevated left atrial pressure without necessarily having elevation in left ventricular end-diastolic pressure.  Treatment is with diuretics and aggressive reduction in heart rate, attempt to return to normal sinus rhythm with cardioversion if possible (see  below).  Currently very well rate controlled on diltiazem and digoxin (nontoxic serum digoxin level).  The weights that have been recorded during this hospital stay are 10-20 pounds off his usual outpatient weight and I do not believe they are accurate.  He does not have overt findings that suggest hypervolemia.  Specifically there is no lower extremity edema and there is probably only mild jugular venous distention. 3. AFib: Unclear when this started.  He was in sinus rhythm in September had his appointment with Dr. Stanford Breed.  He was in atrial fibrillation with controlled response when he saw Kerin Ransom in the office on the day of admission and was not acutely short of breath at that time.  It appears to me that the atrial fibrillation might not be the primary reason for his decompensation.  Ideally, we will try to return to sinus rhythm but because his INR was subtherapeutic this cannot be safely done without first performing a transesophageal echocardiogram.  I be concerned about the longer period of sedation that this would require.  We might be more likely to harm him with his current respiratory status, rather than help.  Reevaluate once his respiratory status is better.  Currently on intravenous heparin until his INR is therapeutic. 4. S/P MVRepair: Has some degree of mitral stenosis due to the previous annuloplasty and will not tolerate atrial fibrillation or tachycardia well.  By echo his mitral valve gradients are not meaningfully changed from the previous study and may actually be a little lower (probably a function of heart rate since the other study was also done during atrial fibrillation).  Even with elevated mitral inflow gradients, he is not a candidate for redo surgery.      For questions or updates, please contact Calvin Please consult [email or web address] for contact info under     Signed, Sanda Klein, MD  08/16/2018 12:00 PM

## 2018-08-16 NOTE — Progress Notes (Signed)
  Echocardiogram 2D Echocardiogram has been performed.  [name] [name] 08/16/2018, 9:19 AM

## 2018-08-16 NOTE — Progress Notes (Addendum)
ANTICOAGULATION CONSULT NOTE - Follow Up Consult  Pharmacy Consult for Warfarin, Heparin bridge Indication: atrial fibrillation  No Known Allergies  Patient Measurements: Height: 5\' 8"  (172.7 cm) Weight: 164 lb 0.4 oz (74.4 kg) IBW/kg (Calculated) : 68.4  Vital Signs: Temp: 98.8 F (37.1 C) (11/28 1158) Temp Source: Oral (11/28 1158) BP: 122/68 (11/28 1158) Pulse Rate: 81 (11/28 1158)  Labs: Recent Labs    08/14/18 0946  08/14/18 1714  08/15/18 0613 08/15/18 1310 08/16/18 0217 08/16/18 1006 08/16/18 1449 08/16/18 1944  HGB  --    < > 13.5  --  13.4  --  12.6*  --   --   --   HCT  --   --  44.1  --  46.1  --  39.5  --   --   --   PLT  --   --  158  --  149*  --  129*  --   --   --   LABPROT  --   --   --   --  13.8  --  <10.0*  --   --   --   INR 0.9*  --   --   --  1.07  --  NOT CALCULATED  --   --   --   HEPARINUNFRC  --   --   --   --   --   --  0.30 0.12*  --  0.17*  CREATININE  --   --  1.38*  --  1.32*  --  1.49*  --   --   --   TROPONINI  --   --   --    < > 0.10* 0.09*  --  0.09* 0.08*  --    < > = values in this interval not displayed.    Estimated Creatinine Clearance: 38.3 mL/min (A) (by C-G formula based on SCr of 1.49 mg/dL (H)).   Medications:  PTA warfarin dosing 2.5 mg M/W/F and 5 mg other days.   Assessment: 82 y/o M with a h/o mitral valve repair, embolic CVA , and PAF admitted with SOB.   Today, 11/28  HL below goal  INR not calculated, PT < 10  No issues with bleeding or IV line  Hgb slightly decreased, otherwise CBC stable  2nd shift update: 19:30 HL = 0.17 with heparin infusion ordered @ 1550 units/hr Spoke with RN and patient lost IV access, documented on the [facility] @ 19:00 today Order for new IV site to be placed this evening  Goal of Therapy:  INR 2-3  Heparin level 0.3-0.7   Plan:   RN to call pharmacy when IV site placed  When heparin resumed, will give a heparin 2000 unit IV bolus x 1 and resume heparin @ previous rate  of 1550 units/hr  Check heparin level 8 hrs after heparin restarted  Daily heparin level, INR, and CBC  Monitor for signs of bleeding  Decklyn Hornik, Toribio Harbour, PharmD 08/16/2018,8:31 PM  ADDENDUM: RN called to notify that patient now has accessible IV site and IV heparin to be resumed at this time.  Will check heparin level and CBC @ 0500 (approx 8 hr after heparin resumed).  Leone Haven, PharmD 08/16/18 @ 21:18

## 2018-08-16 NOTE — Progress Notes (Signed)
ANTICOAGULATION CONSULT NOTE - Follow Up Consult  Pharmacy Consult for Warfarin, Heparin bridge Indication: atrial fibrillation  No Known Allergies  Patient Measurements: Height: 5\' 8"  (172.7 cm) Weight: 176 lb 12.9 oz (80.2 kg) IBW/kg (Calculated) : 68.4  Vital Signs: Temp: 97.9 F (36.6 C) (11/27 2023) Temp Source: Oral (11/27 2023) BP: 129/71 (11/28 0211) Pulse Rate: 83 (11/28 0211)  Labs: Recent Labs    08/14/18 0946 08/14/18 1714 08/15/18 0014 08/15/18 0613 08/15/18 1310 08/16/18 0217  HGB  --  13.5  --  13.4  --   --   HCT  --  44.1  --  46.1  --   --   PLT  --  158  --  149*  --   --   LABPROT  --   --   --  13.8  --   --   INR 0.9*  --   --  1.07  --   --   HEPARINUNFRC  --   --   --   --   --  0.30  CREATININE  --  1.38*  --  1.32*  --  1.49*  TROPONINI  --   --  0.09* 0.10* 0.09*  --     Estimated Creatinine Clearance: 38.3 mL/min (A) (by C-G formula based on SCr of 1.49 mg/dL (H)).   Medications:  PTA warfarin dosing 2.5 mg M/W/F and 5 mg other days.   Assessment: 82 y/o M with a h/o mitral valve repair, embolic CVA , and PAF admitted with SOB.   11/27  INR 1.07  CBC stable  No bleeding reported Today, 11/28  0223 HL = 0.30 at low end of goal, no infusion issues and RN reports very small amount of pink/brown tinged phlegm when he coughed but very minimal.   PM Update: add IV heparin bridge until INR therapeuitc  Goal of Therapy:  INR 2-3  Heparin level 0.3-0.7   Plan:   Will increase heparin drip to 1300 units/hr to ensure stays in range as bolus wears off  Check heparin level in 8hrs  Daily heparin level and CBC   [name] 08/16/2018,2:57 AM

## 2018-08-16 NOTE — Progress Notes (Signed)
ANTICOAGULATION CONSULT NOTE - Follow Up Consult  Pharmacy Consult for Warfarin, Heparin bridge Indication: atrial fibrillation  No Known Allergies  Patient Measurements: Height: 5\' 8"  (172.7 cm) Weight: 164 lb 0.4 oz (74.4 kg) IBW/kg (Calculated) : 68.4  Vital Signs: Temp: 98.1 F (36.7 C) (11/28 0426) Temp Source: Oral (11/28 0426) BP: 124/76 (11/28 0426) Pulse Rate: 92 (11/28 0426)  Labs: Recent Labs    08/14/18 0946  08/14/18 1714 08/15/18 0014 08/15/18 0613 08/15/18 1310 08/16/18 0217 08/16/18 1006  HGB  --    < > 13.5  --  13.4  --  12.6*  --   HCT  --   --  44.1  --  46.1  --  39.5  --   PLT  --   --  158  --  149*  --  129*  --   LABPROT  --   --   --   --  13.8  --  <10.0*  --   INR 0.9*  --   --   --  1.07  --  NOT CALCULATED  --   HEPARINUNFRC  --   --   --   --   --   --  0.30 0.12*  CREATININE  --   --  1.38*  --  1.32*  --  1.49*  --   TROPONINI  --   --   --  0.09* 0.10* 0.09*  --   --    < > = values in this interval not displayed.    Estimated Creatinine Clearance: 38.3 mL/min (A) (by C-G formula based on SCr of 1.49 mg/dL (H)).   Medications:  PTA warfarin dosing 2.5 mg M/W/F and 5 mg other days.   Assessment: 82 y/o M with a h/o mitral valve repair, embolic CVA , and PAF admitted with SOB.   Today, 11/28  HL below goal  INR not calculated, PT < 10  No issues with bleeding or IV line  Hgb slightly decreased, otherwise CBC stable   Goal of Therapy:  INR 2-3  Heparin level 0.3-0.7   Plan:   Bolus heparin 2200 units and increase infusion to 1550 units/hr  Check heparin level in 8 hrs  Repeat warfarin 5 mg tonight  Daily heparin level, INR, and CBC  Monitor for signs of bleeding   Ulice Dash D 08/16/2018,11:08 AM

## 2018-08-16 NOTE — Progress Notes (Signed)
C/o cp 5/10 after a period of freq pvcs, asking for Tums, bp 147/79, P 80, SAt 93% on 3 l/m, chest pain is achey "like indegestion".  Continues to have crackles as he did at beginning of shift.  Continues to have DOE but improves at rest.  RR is 22On call notified and am awaiting response.  RT called to assess and give tx.

## 2018-08-16 NOTE — Progress Notes (Signed)
EKG done previous QRS was 150ms this EKG QRS is 154 ms.  LASix, TUMS and RT given.  Pt reports chest pain is gone.  Have reported to on call and asked if she wanted labs or other meds ordered and the response was just for lasix.  Will continue to monitor.

## 2018-08-17 ENCOUNTER — Inpatient Hospital Stay (HOSPITAL_COMMUNITY): Payer: Medicare Other

## 2018-08-17 DIAGNOSIS — R042 Hemoptysis: Secondary | ICD-10-CM

## 2018-08-17 DIAGNOSIS — J181 Lobar pneumonia, unspecified organism: Secondary | ICD-10-CM

## 2018-08-17 LAB — CBC
HCT: 40.2 % (ref 39.0–52.0)
Hemoglobin: 12.1 g/dL — ABNORMAL LOW (ref 13.0–17.0)
MCH: 26.8 pg (ref 26.0–34.0)
MCHC: 30.1 g/dL (ref 30.0–36.0)
MCV: 88.9 fL (ref 80.0–100.0)
Platelets: 154 10*3/uL (ref 150–400)
RBC: 4.52 MIL/uL (ref 4.22–5.81)
RDW: 15.9 % — ABNORMAL HIGH (ref 11.5–15.5)
WBC: 10.5 10*3/uL (ref 4.0–10.5)
nRBC: 0 % (ref 0.0–0.2)

## 2018-08-17 LAB — BASIC METABOLIC PANEL
ANION GAP: 11 (ref 5–15)
BUN: 29 mg/dL — ABNORMAL HIGH (ref 8–23)
CO2: 25 mmol/L (ref 22–32)
Calcium: 9.3 mg/dL (ref 8.9–10.3)
Chloride: 101 mmol/L (ref 98–111)
Creatinine, Ser: 1.42 mg/dL — ABNORMAL HIGH (ref 0.61–1.24)
GFR calc Af Amer: 54 mL/min — ABNORMAL LOW (ref >60)
GFR calc non Af Amer: 46 mL/min — ABNORMAL LOW (ref >60)
Glucose, Bld: 157 mg/dL — ABNORMAL HIGH (ref 70–99)
Potassium: 4.3 mmol/L (ref 3.5–5.1)
SODIUM: 137 mmol/L (ref 135–145)

## 2018-08-17 LAB — HEPARIN LEVEL (UNFRACTIONATED)
HEPARIN UNFRACTIONATED: 0.28 [IU]/mL — AB (ref 0.30–0.70)
Heparin Unfractionated: 0.58 IU/mL (ref 0.30–0.70)
Heparin Unfractionated: 0.63 IU/mL (ref 0.30–0.70)

## 2018-08-17 LAB — GLUCOSE, CAPILLARY
GLUCOSE-CAPILLARY: 387 mg/dL — AB (ref 70–99)
Glucose-Capillary: 132 mg/dL — ABNORMAL HIGH (ref 70–99)
Glucose-Capillary: 248 mg/dL — ABNORMAL HIGH (ref 70–99)
Glucose-Capillary: 304 mg/dL — ABNORMAL HIGH (ref 70–99)
Glucose-Capillary: 139 mg/dL — ABNORMAL HIGH (ref 70–99)

## 2018-08-17 LAB — PROTIME-INR
INR: 1.34
Prothrombin Time: 16.4 seconds — ABNORMAL HIGH (ref 11.4–15.2)

## 2018-08-17 MED ORDER — INSULIN GLARGINE 100 UNIT/ML ~~LOC~~ SOLN
15.0000 [IU] | Freq: Every day | SUBCUTANEOUS | Status: DC
  Administered 2018-08-17 – 2018-08-19 (×3): 15 [IU] via SUBCUTANEOUS
  Filled 2018-08-17 (×4): qty 0.15

## 2018-08-17 MED ORDER — DIGOXIN 125 MCG PO TABS
0.0625 mg | ORAL_TABLET | Freq: Every day | ORAL | Status: DC
  Administered 2018-08-18 – 2018-08-20 (×3): 0.0625 mg via ORAL
  Filled 2018-08-17 (×3): qty 0.5

## 2018-08-17 MED ORDER — SODIUM CHLORIDE (PF) 0.9 % IJ SOLN
INTRAMUSCULAR | Status: AC
  Filled 2018-08-17: qty 50

## 2018-08-17 MED ORDER — INSULIN ASPART 100 UNIT/ML ~~LOC~~ SOLN
3.0000 [IU] | Freq: Three times a day (TID) | SUBCUTANEOUS | Status: DC
  Administered 2018-08-17 – 2018-08-20 (×9): 3 [IU] via SUBCUTANEOUS

## 2018-08-17 MED ORDER — TORSEMIDE 20 MG PO TABS
20.0000 mg | ORAL_TABLET | Freq: Every day | ORAL | Status: DC
  Administered 2018-08-17 – 2018-08-20 (×4): 20 mg via ORAL
  Filled 2018-08-17 (×5): qty 1

## 2018-08-17 MED ORDER — DOXYCYCLINE HYCLATE 100 MG PO TABS
100.0000 mg | ORAL_TABLET | Freq: Two times a day (BID) | ORAL | Status: DC
  Administered 2018-08-17 – 2018-08-20 (×6): 100 mg via ORAL
  Filled 2018-08-17 (×6): qty 1

## 2018-08-17 MED ORDER — ACETAMINOPHEN 325 MG PO TABS: 650.0000 mg | ORAL_TABLET | Freq: Four times a day (QID) | ORAL | Status: DC | PRN

## 2018-08-17 MED ORDER — SODIUM CHLORIDE 0.9 % IV SOLN
1.0000 g | INTRAVENOUS | Status: DC
  Administered 2018-08-17 – 2018-08-19 (×3): 1 g via INTRAVENOUS
  Filled 2018-08-17: qty 10
  Filled 2018-08-17 (×2): qty 1
  Filled 2018-08-17: qty 10

## 2018-08-17 MED ORDER — IOPAMIDOL (ISOVUE-370) INJECTION 76%
INTRAVENOUS | Status: AC
  Administered 2018-08-17: 80 mL
  Filled 2018-08-17: qty 100

## 2018-08-17 MED ORDER — WARFARIN SODIUM 5 MG PO TABS
5.0000 mg | ORAL_TABLET | Freq: Once | ORAL | Status: AC
  Administered 2018-08-17: 5 mg via ORAL
  Filled 2018-08-17: qty 1

## 2018-08-17 NOTE — Progress Notes (Signed)
ANTICOAGULATION CONSULT NOTE - Follow Up Consult  Pharmacy Consult for Warfarin, Heparin bridge Indication: atrial fibrillation  No Known Allergies  Patient Measurements: Height: 5\' 8"  (172.7 cm) Weight: 164 lb 0.4 oz (74.4 kg) IBW/kg (Calculated) : 68.4  Vital Signs: Temp: 97.5 F (36.4 C) (11/29 0454) Temp Source: Oral (11/29 0454) BP: 124/72 (11/29 0454) Pulse Rate: 93 (11/29 0454)  Labs: Recent Labs    08/14/18 1714  08/15/18 0613  08/16/18 0217 08/16/18 1006 08/16/18 1449 08/16/18 1944 08/17/18 0449  HGB 13.5  --  13.4  --  12.6*  --   --   --  12.1*  HCT 44.1  --  46.1  --  39.5  --   --   --  40.2  PLT 158  --  149*  --  129*  --   --   --  154  LABPROT  --   --  13.8  --  <10.0*  --   --   --  16.4*  INR  --   --  1.07  --  NOT CALCULATED  --   --   --  1.34  HEPARINUNFRC  --   --   --    < > 0.30 0.12*  --  0.17* 0.58  CREATININE 1.38*  --  1.32*  --  1.49*  --   --   --   --   TROPONINI  --    < > 0.10*   < >  --  0.09* 0.08* 0.07*  --    < > = values in this interval not displayed.    Estimated Creatinine Clearance: 38.3 mL/min (A) (by C-G formula based on SCr of 1.49 mg/dL (H)).   Medications:  PTA warfarin dosing 2.5 mg M/W/F and 5 mg other days.   Assessment: 82 y/o M with a h/o mitral valve repair, embolic CVA , and PAF admitted with SOB.   11/28  HL below goal  INR not calculated, PT < 10  No issues with bleeding or IV line  Hgb slightly decreased, otherwise CBC stable  2nd shift update: 19:30 HL = 0.17 with heparin infusion ordered @ 1550 units/hr Spoke with RN and patient lost IV access, documented on the [facility] @ 19:00 today Order for new IV site to be placed this evening  Today, 11/29 0453 HL = 0.58 units/hr at goal, no infusion or bleeding issues per RN.   Goal of Therapy:  INR 2-3  Heparin level 0.3-0.7   Plan:   Continue heparin drip at 1550 units/hr  Recheck HL in 8 hours to ensure stays in range  Daily heparin level,  INR, and CBC  Monitor for signs of bleeding  Dorrene German, PharmD 08/18/2079,5:40 AM

## 2018-08-17 NOTE — Progress Notes (Signed)
Patient son stated he would be able to help patient with with lovenox injections at home.

## 2018-08-17 NOTE — Progress Notes (Signed)
PROGRESS NOTE   [name]  WUJ:[id number]    DOB: April 21, 1936    DOA: 08/14/2018  PCP: Tammi Sou, MD   I have briefly reviewed patients previous medical records in [facility].  Brief Narrative:  82 year old male, lives at home with his spouse, independent, usually sleeps on recliner, PMH of PAF on Coumadin, CAD, chronic diastolic CHF, chronic hypoxic respiratory failure on home oxygen 2-3 L/min continuously, stage III chronic kidney disease, COPD on chronic prednisone, DM 2, HLD, embolic CVA, MVR, HTN, presented to ED with worsening of chronic dyspnea, orthopnea, PND, has chronic leg edema, intermittent productive cough of brown sputum and occasional specks of blood.  Admitted for decompensated CHF and acute on chronic hypoxic respiratory failure.  Overnight 11/27, had episode of exertional chest pain, worsening dyspnea, NSVT versus A. fib with aberrancy on monitor, received additional Lasix 80 mg IV x1, creatinine has further increased from 1.3-1.49.  Cardiology consulted for assistance.  CHF improved.  Ongoing hemoptysis, concern for PE/lung mass (based on previous CTA chest 04/2018) hence repeated CTA chest-no PE but shows areas of bilateral lower lobe consolidation concerning for pneumonia.  Antibiotics initiated.   Assessment & Plan:   Active Problems:   Essential hypertension, benign   CAD (coronary artery disease)   COPD (chronic obstructive pulmonary disease) with emphysema (HCC)   History of mitral valve repair   Atrial fibrillation (HCC)   Acute on chronic respiratory failure with hypoxia (HCC)   Chronic congestive heart failure (HCC)   DM2 (diabetes mellitus, type 2) (HCC)   Acute exacerbation of congestive heart failure (HCC)   HLD (hyperlipidemia)   CKD (chronic kidney disease) stage 3, GFR 30-59 ml/min (HCC)   Leukocytosis   Acute on chronic diastolic CHF (congestive heart failure) (HCC)   Acute on chronic diastolic CHF: TTE 9/56: LVEF 60-65%, severe LVH,  post MV repair and PA peak pressure 72 mmHg.  Reports compliance with medications and daily weights.  Unclear etiology.  Started on IV Lasix 60 mg every 12 hours.  TTE 11/28: LVEF 50-55%, unable to evaluate LV diastolic function, RV pressure overload.  Overnight 11/27, received additional IV Lasix 80 mg for dyspnea and chest pain. Creatinine went up from 1.3-1.5.  Volume status appears much improved.  As discussed with Dr. Katharina Caper, Cardiology on 11/29, will start on torsemide 20 mg daily.  Acute on chronic hypoxic respiratory failure: Likely due to decompensated CHF complicating underlying COPD and pulmonary hypertension.  Treat CHF as above and wean oxygen to prior levels as tolerated.  Had oxygen saturation of 84% per EMS.  Currently saturating in the low 90s on 3 L/min oxygen.  Concerns regarding PE, lung mass versus other etiologies.  Thereby after discussing with cardiology, radiology, underwent CTA chest with reduced contrast volume which was negative for PE but shows areas of consolidation concerning for pneumonia.  I reviewed CTA chest with pulmonology and no lung mass noted.  VQ scan was considered but felt to be low yield study given advanced lung disease.  After weighing risks versus benefits, CTA chest done and it was discussed in detail with patient prior to procedure including risks and benefits and he agreed.  Lobar pneumonia/community-acquired pneumonia: Patient had symptoms of productive cough prior to admission.  He had mild hemoptysis prior to admission.  CTA chest shows progressive left lower lobe collapse/consolidation and collapse/consolidation in posterior right lower lobe.  Started IV ceftriaxone and oral doxycycline.  Incentive spirometry.  Chest physical therapy.  Swallow evaluation  by speech therapy to rule out aspiration as cause.  COPD: No clinical bronchospasm.  Oxygen supplements and as needed bronchodilator nebulizations.  Continue chronic prednisone 30 mg daily, seems to be a  high maintenance dose and may consider gradually tapering his outpatient.  Essential hypertension: Controlled.  Hypokalemia: Replaced  Elevated troponin: Mild and flat trend.  Likely related to demand ischemia from decompensated CHF, respiratory failure and chronic kidney disease.  Due to overnight episode of chest pain, repeated troponin and flat/no change.  Paroxysmal A. Fib: CHA2DS2VAsc 6.  Rate controlled.  Continue digoxin (dose reduced by cardiology), Cardizem.  INR subtherapeutic.  Coumadin & heparin per pharmacy.  However given prior history of embolic strokes, and high embolic risk, IV heparin bridging until INR therapeutic.  NSVT versus A. fib with aberrancy: Noted overnight 11/27.  Continue telemetry.  Attempt to keep potassium greater than 4 and magnesium greater than 2.  Digoxin level 1.3, dose reduced.  CAD: Had angina-like symptoms overnight.  Troponin flat.  Management as above.  Hyperlipidemia: Continue statins.  DM2: A1c 8.8 on 07/24/2018 indicating poor control.  Increased Lantus to 15 units at bedtime.  Continue SSI.  Better.  Monitor closely and adjust insulins as needed.  I added NovoLog mealtime 3 units.  Acute Kidney Injury complicating stage III chronic kidney disease: Recent baseline creatinine 1-1.2.  Creatinine on admission 1.3 which has now increased to 1.5, likely multifactorial due to cardiorenal and aggressive diuresis.  Held Lasix .  Creatinine stable.  Initiate torsemide from tomorrow.  History of embolic CVA: Anticoagulation discussion as above.  Hemoptysis, mild: Likely related to CHF/pulmonary edema.  Monitor closely while on anticoagulation as discussed.  No change.  Rest of evaluation and management as discussed above.  Leukocytosis: Suspect due to chronic steroids.  No clinical concern for infection.  Resolved.  Thrombocytopenia: Resolved.    DVT prophylaxis: IV heparin bridging until INR therapeutic on Coumadin. Code Status: DNR Family  Communication: Discussed with spouse this morning at bedside.  Subsequently discussed with son via phone, updated care and answered questions including CTA report. Disposition: To be determined pending clinical improvement.   Consultants:  Cardiology  Procedures:  None  Antimicrobials:  None   Subjective: Patient anxious to go home.  States that his dyspnea does not feel significantly better than on admission.  Coughed all night with minimal intermittent hemoptysis and brown sputum.  Dyspnea on minimal exertion.  Some pleuritic type of chest pain.  ROS: Stays with spouse and does not use any assistance for walking.  Otherwise negative.  Objective:  Vitals:   08/16/18 2045 08/17/18 0454 08/17/18 0837 08/17/18 1341  BP: 131/63 124/72  133/77  Pulse: 94 93  (!) 129  Resp: 20 20  20   Temp: 97.8 F (36.6 C) (!) 97.5 F (36.4 C)  98.6 F (37 C)  TempSrc: Oral Oral  Oral  SpO2: 94% 94% 98% 92%  Weight:  81.5 kg    Height:        Examination:  General exam: Elderly male, moderately built and nourished, lying comfortably propped up in bed. Respiratory system: Diminished breath sounds in the bases with occasional basal crackles.  Rest of lung fields showed distant breath sounds but otherwise clear to auscultation.  No increased work of breathing.   Cardiovascular system: S1 & S2 heard, irregularly irregular. No murmurs, rubs, gallops or clicks. No pedal edema or presacral edema.  No JVD.  Telemetry personally reviewed: A. fib with BBB morphology with ventricular rate mostly  in the 100s.  No further episodes of wide-complex tachycardia. Gastrointestinal system: Abdomen is nondistended, soft and nontender. No organomegaly or masses felt. Normal bowel sounds heard.  Stable Central nervous system: Alert and oriented. No focal neurological deficits.  Stable Extremities: Symmetric 5 x 5 power. Skin: No rashes, lesions or ulcers Psychiatry: Judgement and insight appears impaired. Mood &  affect appropriate.     Data Reviewed: I have personally reviewed following labs and imaging studies  CBC: Recent Labs  Lab 08/14/18 1714 08/15/18 0613 08/16/18 0217 08/17/18 0449  WBC 14.0* 13.8* 10.6* 10.5  NEUTROABS 11.4*  --   --   --   HGB 13.5 13.4 12.6* 12.1*  HCT 44.1 46.1 39.5 40.2  MCV 88.7 92.0 86.2 88.9  PLT 158 149* 129* 295   Basic Metabolic Panel: Recent Labs  Lab 08/14/18 1714 08/15/18 0613 08/16/18 0217 08/16/18 1449 08/17/18 0449  NA 140 139 136  --  137  K 3.7 3.4* 3.5  --  4.3  CL 99 99 97*  --  101  CO2 31 29 29   --  25  GLUCOSE 259* 177* 199*  --  157*  BUN 17 18 27*  --  29*  CREATININE 1.38* 1.32* 1.49*  --  1.42*  CALCIUM 9.2 8.8* 8.9  --  9.3  MG  --   --   --  2.2  --    Coagulation Profile: Recent Labs  Lab 08/14/18 0946 08/15/18 0613 08/16/18 0217 08/17/18 0449  INR 0.9* 1.07 NOT CALCULATED 1.34   Cardiac Enzymes: Recent Labs  Lab 08/15/18 0613 08/15/18 1310 08/16/18 1006 08/16/18 1449 08/16/18 1944  TROPONINI 0.10* 0.09* 0.09* 0.08* 0.07*   CBG: Recent Labs  Lab 08/16/18 1133 08/16/18 1641 08/16/18 2133 08/17/18 0724 08/17/18 1106  GLUCAP 213* 387* 223* 132* 248*    No results found for this or any previous visit (from the past 240 hour(s)).       Radiology Studies: Ct Angio Chest Pe W Or Wo Contrast  Result Date: 08/17/2018 CLINICAL DATA:  Shortness of breath.  Concern for pulmonary embolus. EXAM: CT ANGIOGRAPHY CHEST WITH CONTRAST TECHNIQUE: Multidetector CT imaging of the chest was performed using the standard protocol during bolus administration of intravenous contrast. Multiplanar CT image reconstructions and MIPs were obtained to evaluate the vascular anatomy. CONTRAST:  63mL ISOVUE-370 IOPAMIDOL (ISOVUE-370) INJECTION 76% COMPARISON:  05/19/2018 FINDINGS: Cardiovascular: Heart is enlarged. Coronary artery calcification is evident. Atherosclerotic calcification is noted in the wall of the thoracic aorta.  No filling defects in the opacified pulmonary arteries to suggest the presence of an acute pulmonary embolus. Evaluation of lower lobe segmental and subsegmental branches is limited by motion artifact from patient breathing. Mediastinum/Nodes: Scattered small lymph nodes are seen in the mediastinum. 10 mm short axis anterior left hilar lymph node is stable. 13 mm short axis subcarinal lymph node is unchanged. 19 mm short axis lymph node inferior right hilum is stable. The esophagus has normal imaging features. There is no axillary lymphadenopathy. Lungs/Pleura: The central tracheobronchial airways are patent. Centrilobular and paraseptal emphysema noted bilaterally. Fine architectural detail of the lungs is obscured by continuous breathing motion during image acquisition. 4 mm right upper lobe pulmonary nodule seen previously is stable (29/10) interval development of patchy airspace disease in the posterior right lower lobe. The confluent left lower lobe opacity seen previously shows progression medially (image 81/10). No substantial pleural effusion. Upper Abdomen: Stable 2.2 cm left adrenal nodule. Low attenuation in this nodule is  compatible with benign adrenal adenoma. Musculoskeletal: No worrisome lytic or sclerotic osseous abnormality. Review of the MIP images confirms the above findings. IMPRESSION: 1. No CT evidence for acute pulmonary embolus. 2. Progressive left lower lobe collapse/consolidation since prior study. This is associated with new collapse/consolidation in the posterior right lower lobe. 3. Stable mediastinal and right hilar lymphadenopathy. 4.  Emphysema. (ICD10-J43.9) 5.  Aortic Atherosclerois (ICD10-170.0) Electronically Signed   By: Misty Stanley M.D.   On: 08/17/2018 13:13        Scheduled Meds: . arformoterol  15 mcg Nebulization BID  . budesonide  0.5 mg Nebulization BID  . [START ON 08/18/2018] digoxin  0.0625 mg Oral Daily  . diltiazem  240 mg Oral Daily  . doxycycline  100  mg Oral Q12H  . insulin aspart  0-5 Units Subcutaneous QHS  . insulin aspart  0-9 Units Subcutaneous TID WC  . insulin glargine  10 Units Subcutaneous QHS  . mouth rinse  15 mL Mouth Rinse BID  . pravastatin  40 mg Oral q1800  . predniSONE  30 mg Oral Q breakfast  . sodium chloride (PF)      . torsemide  20 mg Oral Daily  . umeclidinium bromide  1 puff Inhalation Daily  . warfarin  5 mg Oral ONCE-1800  . Warfarin - Pharmacist Dosing Inpatient   Does not apply q1800   Continuous Infusions: . cefTRIAXone (ROCEPHIN)  IV    . heparin 1,650 Units/hr (08/17/18 1426)     LOS: 2 days     Vernell Leep, MD, FACP, [facility]. [facility] Pager [phone]  If 7PM-7AM, please contact night-coverage [email or web address] Password [facility] 08/17/2018, 4:19 PM

## 2018-08-17 NOTE — Progress Notes (Signed)
ANTICOAGULATION CONSULT NOTE - Follow Up Consult  Pharmacy Consult for Warfarin, Heparin bridge Indication: atrial fibrillation  No Known Allergies  Patient Measurements: Height: 5\' 8"  (172.7 cm) Weight: 179 lb 10.8 oz (81.5 kg) IBW/kg (Calculated) : 68.4  Vital Signs: Temp: 98.1 F (36.7 C) (11/29 2150) Temp Source: Oral (11/29 2150) BP: 127/65 (11/29 2150) Pulse Rate: 73 (11/29 2150)  Labs: Recent Labs    08/15/18 9449  08/16/18 0217 08/16/18 1006 08/16/18 1449 08/16/18 1944 08/17/18 0449 08/17/18 1325 08/17/18 2214  HGB 13.4  --  12.6*  --   --   --  12.1*  --   --   HCT 46.1  --  39.5  --   --   --  40.2  --   --   PLT 149*  --  129*  --   --   --  154  --   --   LABPROT 13.8  --  <10.0*  --   --   --  16.4*  --   --   INR 1.07  --  NOT CALCULATED  --   --   --  1.34  --   --   HEPARINUNFRC  --    < > 0.30 0.12*  --  0.17* 0.58 0.28* 0.63  CREATININE 1.32*  --  1.49*  --   --   --  1.42*  --   --   TROPONINI 0.10*   < >  --  0.09* 0.08* 0.07*  --   --   --    < > = values in this interval not displayed.    Estimated Creatinine Clearance: 40.1 mL/min (A) (by C-G formula based on SCr of 1.42 mg/dL (H)).   Medications:  PTA warfarin dosing 2.5 mg M/W/F and 5 mg other days.   Assessment: 82 y/o M with a h/o mitral valve repair, embolic CVA , and PAF admitted with SOB.   08/17/2018:  1325 HL below goal at 0.28 on 1550 units/hr  INR rising to goal- 1.34 today  No issues with bleeding or IV line  Hgb trending down, Pltc WNL  2214 HL=0.63 at goal, no infusion or bleeding issues per RN.   Goal of Therapy:  INR 2-3  Heparin level 0.3-0.7   Plan:   Continue  heparin drip at 1650 units/hr  Daily heparin level, INR, and CBC  Monitor for signs of bleeding  Dorrene German, PharmD 08/17/2018,10:52 PM

## 2018-08-17 NOTE — Progress Notes (Signed)
Office is closed so unable to scheduled appts. Coumadin clinic requested for Monday. Sent message to our schedulers to review on Monday and call pt with appt info. Dayna Dunn PA-C

## 2018-08-17 NOTE — Progress Notes (Signed)
Patient stated that he has been having hemoptysis.  CPT not done as this is contraindicated at this time.

## 2018-08-17 NOTE — Progress Notes (Signed)
ANTICOAGULATION CONSULT NOTE - Follow Up Consult  Pharmacy Consult for Warfarin, Heparin bridge Indication: atrial fibrillation  No Known Allergies  Patient Measurements: Height: 5\' 8"  (172.7 cm) Weight: 179 lb 10.8 oz (81.5 kg) IBW/kg (Calculated) : 68.4  Vital Signs: Temp: 98.6 F (37 C) (11/29 1341) Temp Source: Oral (11/29 1341) BP: 133/77 (11/29 1341) Pulse Rate: 129 (11/29 1341)  Labs: Recent Labs    08/15/18 0613  08/16/18 0217 08/16/18 1006 08/16/18 1449 08/16/18 1944 08/17/18 0449 08/17/18 1325  HGB 13.4  --  12.6*  --   --   --  12.1*  --   HCT 46.1  --  39.5  --   --   --  40.2  --   PLT 149*  --  129*  --   --   --  154  --   LABPROT 13.8  --  <10.0*  --   --   --  16.4*  --   INR 1.07  --  NOT CALCULATED  --   --   --  1.34  --   HEPARINUNFRC  --    < > 0.30 0.12*  --  0.17* 0.58 0.28*  CREATININE 1.32*  --  1.49*  --   --   --  1.42*  --   TROPONINI 0.10*   < >  --  0.09* 0.08* 0.07*  --   --    < > = values in this interval not displayed.    Estimated Creatinine Clearance: 40.1 mL/min (A) (by C-G formula based on SCr of 1.42 mg/dL (H)).   Medications:  PTA warfarin dosing 2.5 mg M/W/F and 5 mg other days.   Assessment: 82 y/o M with a h/o mitral valve repair, embolic CVA , and PAF admitted with SOB.   08/17/2018:  HL now slightly below goal at 0.28 on 1550 units/hr  INR rising to goal- 1.34 today  No issues with bleeding or IV line  Hgb trending down, Pltc WNL  Goal of Therapy:  INR 2-3  Heparin level 0.3-0.7   Plan:   Increase heparin drip to 1650 units/hr  Recheck HL in 8 hours   Coumadin 5mg  po x1 today  Daily heparin level, INR, and CBC  Monitor for signs of bleeding  Viviann Broyles, Lavonia Drafts, PharmD 08/17/2018,2:07 PM

## 2018-08-17 NOTE — Progress Notes (Addendum)
Progress Note  Patient Name: [name] Date of Encounter: 08/17/2018  Primary Cardiologist: Kirk Ruths, MD  Subjective   Patient indicates he has hasn't been up walking around but does not necessarily feel any different than when he first came in at rest. However, he now denies any orthopnea, PND, or edema. He continues to have scant hemoptysis and brown sputum but indicates the hemoptysis has decreased some. He coughed quite a bit in the last 24 hours.  Inpatient Medications    Scheduled Meds: . arformoterol  15 mcg Nebulization BID  . budesonide  0.5 mg Nebulization BID  . digoxin  0.125 mg Oral Daily  . diltiazem  240 mg Oral Daily  . insulin aspart  0-5 Units Subcutaneous QHS  . insulin aspart  0-9 Units Subcutaneous TID WC  . insulin glargine  10 Units Subcutaneous QHS  . mouth rinse  15 mL Mouth Rinse BID  . pravastatin  40 mg Oral q1800  . predniSONE  30 mg Oral Q breakfast  . umeclidinium bromide  1 puff Inhalation Daily  . Warfarin - Pharmacist Dosing Inpatient   Does not apply q1800   Continuous Infusions: . heparin 1,550 Units/hr (08/17/18 0820)   PRN Meds: albuterol, calcium carbonate, guaiFENesin-dextromethorphan, LORazepam   Vital Signs    Vitals:   08/16/18 2026 08/16/18 2045 08/17/18 0454 08/17/18 0837  BP:  131/63 124/72   Pulse:  94 93   Resp:  20 20   Temp:  97.8 F (36.6 C) (!) 97.5 F (36.4 C)   TempSrc:  Oral Oral   SpO2: 93% 94% 94% 98%  Weight:   81.5 kg   Height:        Intake/Output Summary (Last 24 hours) at 08/17/2018 1050 Last data filed at 08/17/2018 6433 Gross per 24 hour  Intake 480.5 ml  Output 425 ml  Net 55.5 ml   Filed Weights   08/15/18 0441 08/16/18 0602 08/17/18 0454  Weight: 80.2 kg 74.4 kg 81.5 kg    Telemetry    Atrial fib rates 100s - also with h/o intermittent RBBB vs abberrancy - Personally Reviewed  Physical Exam   GEN: No acute distress, generally ruddy complexion.  HEENT: Normocephalic,  atraumatic, sclera non-icteric. Neck: No JVD or bruits. Cardiac: Irregularly irregular, rate borderline elevated, no murmurs, rubs, or gallops.  Radials/DP/PT 1+ and equal bilaterally.  Respiratory: Coarse BS and diminished throughout but no overt wheezes, rales or rhonchi. Appears mildly SOB at baseline. GI: Soft, nontender, non-distended, BS +x 4. MS: no deformity. Extremities: No clubbing or cyanosis. No edema. Distal pedal pulses are 2+ and equal bilaterally. Neuro:  AAOx3. Follows commands. Psych:  Responds to questions appropriately with a normal affect.  Labs    Chemistry Recent Labs  Lab 08/15/18 0613 08/16/18 0217 08/17/18 0449  NA 139 136 137  K 3.4* 3.5 4.3  CL 99 97* 101  CO2 29 29 25   GLUCOSE 177* 199* 157*  BUN 18 27* 29*  CREATININE 1.32* 1.49* 1.42*  CALCIUM 8.8* 8.9 9.3  GFRNONAA 51* 44* 46*  GFRAA 59* 51* 54*  ANIONGAP 11 10 11      Hematology Recent Labs  Lab 08/15/18 0613 08/16/18 0217 08/17/18 0449  WBC 13.8* 10.6* 10.5  RBC 5.01 4.58 4.52  HGB 13.4 12.6* 12.1*  HCT 46.1 39.5 40.2  MCV 92.0 86.2 88.9  MCH 26.7 27.5 26.8  MCHC 29.1* 31.9 30.1  RDW 16.2* 15.9* 15.9*  PLT 149* 129* 154  Cardiac Enzymes Recent Labs  Lab 08/15/18 1310 08/16/18 1006 08/16/18 1449 08/16/18 1944  TROPONINI 0.09* 0.09* 0.08* 0.07*   No results for input(s): TROPIPOC in the last 168 hours.   BNP Recent Labs  Lab 08/14/18 1715  BNP 98.8     DDimer No results for input(s): DDIMER in the last 168 hours.   Radiology    No results found.  Cardiac Studies   2D echo 08/16/18 Study Conclusions  - Left ventricle: The cavity size was normal. There was mild   concentric hypertrophy. Systolic function was normal. The   estimated ejection fraction was in the range of 50% to 55%. Wall   motion was normal; there were no regional wall motion   abnormalities. The study is not technically sufficient to allow   evaluation of LV diastolic function. -  Ventricular septum: Septal motion showed paradox. The contour   showed systolic flattening. These changes are consistent with RV   pressure overload. - Aortic valve: Right coronary cusp mobility was severely   restricted. There was trivial regurgitation. - Mitral valve: Prior procedures included surgical repair. An   annular ring prosthesis was present. Valve area by pressure   half-time: 1.76 cm^2. - Left atrium: The atrium was severely dilated. - Right ventricle: The cavity size was moderately dilated. Systolic   function was mildly reduced. PA pressure is likely underestimated   due to poor TR jet. - Right atrium: The atrium was severely dilated.  Patient Profile     82 y.o. male with h/o MV repair 2006 with redo MV repair in 2007 for endocarditis (embolic stroke at that time), paroxysmal atrial fib/flutter s/p MAZE procedure, nonobstructive CAD, chronic diastolic HF, PAH, nonobstructive CAD 2007, chronic hypoxic respiratory failure on O2 due to emphysema, obesity-hypoventilation syndrome., small AAA,, HTN, HLD, CKD 3, prostate CA, ulcerative colitis, masslike-consolidation 04/2018 (pt felt too high risk for surgical intervention or biopsy). He presented to [facility] hospital with worsening DOE, orthopnea, PND, productive cough, found to be hypoxic on previous home O2 administration. BNP 98; CXR with interstitial pulm edema and L pleural effusion.  Assessment & Plan    1. Acute on chronic hypoxic respiratory failure with h/o lung mass, OSA/OHS, COPD - he remains with SOB. RV is also newly enlarged, unclear if related to underlying respiratory disease but need to think about PE. He also reports scant hemoptysis and was planning OP CT chest to follow up his lung mass. Pending review with Dr. Acie Fredrickson.  2. Acute on chronic diastolic CHF with RV overload as well - edema has resolved. Lung sounds do not reveal any crackles. BNP was only 98. Weight down 3kg from recent office weight. Suspect euvolemic. Will  discuss diuretic dose with MD given rising BUN.  3. Persistent atrial fibrillation - he was in sinus in 05/2018 but in AFib during 07/17/18 appointment with Dr. Stanford Breed and in afib at f/u. For now we will continue rate control. Given subtherapeutic INR on admission, no plans to immediately cardiovert this admission. TEE is deferred due to h/o tenuous respiratory status and OSA. For now on heparin/Coumadin. Digoxin level is above ideal range, will decrease to 0.0625mg  daily given his creatinine clearance. Consideration could be given to increasing diltiazem to 300mg  daily. Will also add thyroid to labs in AM.  4. H/o MV repair with redo - evaluated by echo above.  5. Minimally elevated troponin - do not suspect ACS as this is low and flat. No CP.  For questions or updates, please  contact [place] Please consult [email or web address] for contact info under Cardiology/STEMI.  Signed, Charlie Pitter, PA-C 08/17/2018, 10:50 AM    Attending Note:   The patient was seen and examined.  Agree with assessment and plan as noted above.  Changes made to the above note as needed.  Patient seen and independently examined with Melina Copa, PA .   We discussed all aspects of the encounter. I agree with the assessment and plan as stated above.  1.   Respiratory failure:    Presents with worsening respiratory issus. He has no edema this am and did not have significant signs or symptoms of CHF yesterday when seen by Dr. Loletha Grayer.   I doubt that this was an exacerbation of CHF When asked, he did say that he had some pleuretic CP - did not know when it started.  He has RV dilitation on echo ( which is new ) He needs to be worked up for Pulmonary embolus.  2.  Paroxysmal atrial fib - getting restarted on his coumadin I would bridge with Lovenox secondary to hx of CVA    I have spent a total of 40 minutes with patient reviewing hospital  notes , telemetry, EKGs, labs and examining patient as well as establishing an  assessment and plan that was discussed with the patient. > 50% of time was spent in direct patient care.    Thayer Headings, Brooke Bonito., MD, [facility] 08/17/2018, 11:04 AM [address],  Section Pager [phone]

## 2018-08-18 DIAGNOSIS — I480 Paroxysmal atrial fibrillation: Secondary | ICD-10-CM

## 2018-08-18 LAB — BASIC METABOLIC PANEL
Anion gap: 12 (ref 5–15)
BUN: 23 mg/dL (ref 8–23)
CO2: 26 mmol/L (ref 22–32)
Calcium: 9 mg/dL (ref 8.9–10.3)
Chloride: 98 mmol/L (ref 98–111)
Creatinine, Ser: 1.28 mg/dL — ABNORMAL HIGH (ref 0.61–1.24)
GFR calc Af Amer: >60 mL/min (ref >60)
GFR, EST NON AFRICAN AMERICAN: 53 mL/min — AB (ref >60)
GLUCOSE: 94 mg/dL (ref 70–99)
Potassium: 4.3 mmol/L (ref 3.5–5.1)
Sodium: 136 mmol/L (ref 135–145)

## 2018-08-18 LAB — CBC
HCT: 38.4 % — ABNORMAL LOW (ref 39.0–52.0)
Hemoglobin: 11.7 g/dL — ABNORMAL LOW (ref 13.0–17.0)
MCH: 27 pg (ref 26.0–34.0)
MCHC: 30.5 g/dL (ref 30.0–36.0)
MCV: 88.7 fL (ref 80.0–100.0)
Platelets: 148 10*3/uL — ABNORMAL LOW (ref 150–400)
RBC: 4.33 MIL/uL (ref 4.22–5.81)
RDW: 15.9 % — ABNORMAL HIGH (ref 11.5–15.5)
WBC: 10.9 10*3/uL — ABNORMAL HIGH (ref 4.0–10.5)
nRBC: 0 % (ref 0.0–0.2)

## 2018-08-18 LAB — GLUCOSE, CAPILLARY
GLUCOSE-CAPILLARY: 197 mg/dL — AB (ref 70–99)
Glucose-Capillary: 109 mg/dL — ABNORMAL HIGH (ref 70–99)
Glucose-Capillary: 186 mg/dL — ABNORMAL HIGH (ref 70–99)
Glucose-Capillary: 349 mg/dL — ABNORMAL HIGH (ref 70–99)

## 2018-08-18 LAB — PROTIME-INR
INR: 1.62
Prothrombin Time: 19 seconds — ABNORMAL HIGH (ref 11.4–15.2)

## 2018-08-18 LAB — TSH: TSH: 2.438 u[IU]/mL (ref 0.350–4.500)

## 2018-08-18 LAB — HEPARIN LEVEL (UNFRACTIONATED): Heparin Unfractionated: 0.4 IU/mL (ref 0.30–0.70)

## 2018-08-18 MED ORDER — WARFARIN SODIUM 5 MG PO TABS
5.0000 mg | ORAL_TABLET | Freq: Once | ORAL | Status: AC
  Administered 2018-08-18: 5 mg via ORAL
  Filled 2018-08-18: qty 1

## 2018-08-18 MED ORDER — BENZONATATE 100 MG PO CAPS
200.0000 mg | ORAL_CAPSULE | Freq: Three times a day (TID) | ORAL | Status: DC
  Administered 2018-08-18 – 2018-08-20 (×8): 200 mg via ORAL
  Filled 2018-08-18 (×7): qty 2

## 2018-08-18 NOTE — Progress Notes (Signed)
Progress Note  Patient Name: [name] Date of Encounter: 08/18/2018  Primary Cardiologist: Kirk Ruths, MD   Subjective   CT scan did not show PE.  He does have emphysema however.  No chest pain.  Inpatient Medications    Scheduled Meds: . arformoterol  15 mcg Nebulization BID  . budesonide  0.5 mg Nebulization BID  . digoxin  0.0625 mg Oral Daily  . diltiazem  240 mg Oral Daily  . doxycycline  100 mg Oral Q12H  . insulin aspart  0-5 Units Subcutaneous QHS  . insulin aspart  0-9 Units Subcutaneous TID WC  . insulin aspart  3 Units Subcutaneous TID WC  . insulin glargine  15 Units Subcutaneous QHS  . mouth rinse  15 mL Mouth Rinse BID  . pravastatin  40 mg Oral q1800  . predniSONE  30 mg Oral Q breakfast  . torsemide  20 mg Oral Daily  . umeclidinium bromide  1 puff Inhalation Daily  . Warfarin - Pharmacist Dosing Inpatient   Does not apply q1800   Continuous Infusions: . cefTRIAXone (ROCEPHIN)  IV Stopped (08/17/18 1940)  . heparin 1,650 Units/hr (08/18/18 0300)   PRN Meds: acetaminophen, albuterol, calcium carbonate, guaiFENesin-dextromethorphan, LORazepam   Vital Signs    Vitals:   08/17/18 2003 08/17/18 2150 08/18/18 0618 08/18/18 0810  BP:  127/65 136/72   Pulse:  73 80   Resp:  18 16   Temp:  98.1 F (36.7 C) 98 F (36.7 C)   TempSrc:  Oral Oral   SpO2: 95% 94% 95% 96%  Weight:   78.8 kg   Height:        Intake/Output Summary (Last 24 hours) at 08/18/2018 1024 Last data filed at 08/18/2018 0855 Gross per 24 hour  Intake 1508.46 ml  Output 300 ml  Net 1208.46 ml   Filed Weights   08/16/18 0602 08/17/18 0454 08/18/18 0618  Weight: 74.4 kg 81.5 kg 78.8 kg    Telemetry    Atrial fibrillation reasonable rate control- Personally Reviewed  ECG    Atrial fibrillation- Personally Reviewed  Physical Exam   GEN: No acute distress.  Elderly Neck: No JVD Cardiac:  Irregularly irregular, no murmurs, rubs, or gallops.  Respiratory:  Clear to auscultation bilaterally. GI: Soft, nontender, non-distended  MS: No edema; No deformity. Neuro:  Nonfocal  Psych: Normal affect   Labs    Chemistry Recent Labs  Lab 08/15/18 0613 08/16/18 0217 08/17/18 0449  NA 139 136 137  K 3.4* 3.5 4.3  CL 99 97* 101  CO2 29 29 25   GLUCOSE 177* 199* 157*  BUN 18 27* 29*  CREATININE 1.32* 1.49* 1.42*  CALCIUM 8.8* 8.9 9.3  GFRNONAA 51* 44* 46*  GFRAA 59* 51* 54*  ANIONGAP 11 10 11      Hematology Recent Labs  Lab 08/16/18 0217 08/17/18 0449 08/18/18 0514  WBC 10.6* 10.5 10.9*  RBC 4.58 4.52 4.33  HGB 12.6* 12.1* 11.7*  HCT 39.5 40.2 38.4*  MCV 86.2 88.9 88.7  MCH 27.5 26.8 27.0  MCHC 31.9 30.1 30.5  RDW 15.9* 15.9* 15.9*  PLT 129* 154 148*    Cardiac Enzymes Recent Labs  Lab 08/15/18 1310 08/16/18 1006 08/16/18 1449 08/16/18 1944  TROPONINI 0.09* 0.09* 0.08* 0.07*   No results for input(s): TROPIPOC in the last 168 hours.   BNP Recent Labs  Lab 08/14/18 1715  BNP 98.8     DDimer No results for input(s): DDIMER in the last 168  hours.   Radiology    Ct Angio Chest Pe W Or Wo Contrast  Result Date: 08/17/2018 CLINICAL DATA:  Shortness of breath.  Concern for pulmonary embolus. EXAM: CT ANGIOGRAPHY CHEST WITH CONTRAST TECHNIQUE: Multidetector CT imaging of the chest was performed using the standard protocol during bolus administration of intravenous contrast. Multiplanar CT image reconstructions and MIPs were obtained to evaluate the vascular anatomy. CONTRAST:  2mL ISOVUE-370 IOPAMIDOL (ISOVUE-370) INJECTION 76% COMPARISON:  05/19/2018 FINDINGS: Cardiovascular: Heart is enlarged. Coronary artery calcification is evident. Atherosclerotic calcification is noted in the wall of the thoracic aorta. No filling defects in the opacified pulmonary arteries to suggest the presence of an acute pulmonary embolus. Evaluation of lower lobe segmental and subsegmental branches is limited by motion artifact from patient  breathing. Mediastinum/Nodes: Scattered small lymph nodes are seen in the mediastinum. 10 mm short axis anterior left hilar lymph node is stable. 13 mm short axis subcarinal lymph node is unchanged. 19 mm short axis lymph node inferior right hilum is stable. The esophagus has normal imaging features. There is no axillary lymphadenopathy. Lungs/Pleura: The central tracheobronchial airways are patent. Centrilobular and paraseptal emphysema noted bilaterally. Fine architectural detail of the lungs is obscured by continuous breathing motion during image acquisition. 4 mm right upper lobe pulmonary nodule seen previously is stable (29/10) interval development of patchy airspace disease in the posterior right lower lobe. The confluent left lower lobe opacity seen previously shows progression medially (image 81/10). No substantial pleural effusion. Upper Abdomen: Stable 2.2 cm left adrenal nodule. Low attenuation in this nodule is compatible with benign adrenal adenoma. Musculoskeletal: No worrisome lytic or sclerotic osseous abnormality. Review of the MIP images confirms the above findings. IMPRESSION: 1. No CT evidence for acute pulmonary embolus. 2. Progressive left lower lobe collapse/consolidation since prior study. This is associated with new collapse/consolidation in the posterior right lower lobe. 3. Stable mediastinal and right hilar lymphadenopathy. 4.  Emphysema. (ICD10-J43.9) 5.  Aortic Atherosclerois (ICD10-170.0) Electronically Signed   By: Misty Stanley M.D.   On: 08/17/2018 13:13    Cardiac Studies   EF 55% mitral valve repair.  Patient Profile     82 y.o. male MV repair 2006 with redo MV repair in 2007 for endocarditis (embolic stroke at that time), paroxysmal atrial fib/flutter s/p MAZE procedure, nonobstructive CAD, chronic diastolic HF, PAH, nonobstructive CAD 2007, chronic hypoxic respiratory failure on O2 due to emphysema, obesity-hypoventilation syndrome., small AAA,, HTN, HLD, CKD 3,  prostate CA, ulcerative colitis, masslike-consolidation 04/2018 (pt felt too high risk for surgical intervention or biopsy). He presented to [facility] hospital with worsening DOE, orthopnea, PND, productive cough, found to be hypoxic on previous home O2 administration. BNP 98; CXR with interstitial pulm edema and L pleural effusion.  Assessment & Plan    Acute on chronic hypoxic respiratory failure -No evidence of PE on CT.  Reassuring. - Right ventricle enlargement secondary to underlying emphysema.  Acute on chronic diastolic heart failure with RV overload - No longer with edema.  Appears euvolemic.  This may have been secondary to his COPD.  Continue low-dose torsemide.  Atrial fibrillation persistent -Continue bridge with Lovenox until INR therapeutic at 2.0.  On Coumadin.  [facility] will sign off.   Medication Recommendations:  No new Other recommendations (labs, testing, etc):  none Follow up as an outpatient:  As prior sched  For questions or updates, please contact [facility] Please consult [email or web address] for contact info under        Signed, [employer]  Marlou Porch, MD  08/18/2018, 10:24 AM

## 2018-08-18 NOTE — Progress Notes (Signed)
ANTICOAGULATION CONSULT NOTE - Follow Up Consult  Pharmacy Consult for Warfarin, Heparin bridge Indication: atrial fibrillation  No Known Allergies  Patient Measurements: Height: 5\' 8"  (172.7 cm) Weight: 173 lb 12.8 oz (78.8 kg) IBW/kg (Calculated) : 68.4  Vital Signs: Temp: 98 F (36.7 C) (11/30 0618) Temp Source: Oral (11/30 0618) BP: 136/72 (11/30 0618) Pulse Rate: 80 (11/30 0618)  Labs: Recent Labs    08/16/18 0217 08/16/18 1006 08/16/18 1449 08/16/18 1944 08/17/18 0449 08/17/18 1325 08/17/18 2214 08/18/18 0514  HGB 12.6*  --   --   --  12.1*  --   --  11.7*  HCT 39.5  --   --   --  40.2  --   --  38.4*  PLT 129*  --   --   --  154  --   --  148*  LABPROT <10.0*  --   --   --  16.4*  --   --  19.0*  INR NOT CALCULATED  --   --   --  1.34  --   --  1.62  HEPARINUNFRC 0.30 0.12*  --  0.17* 0.58 0.28* 0.63 0.40  CREATININE 1.49*  --   --   --  1.42*  --   --  1.28*  TROPONINI  --  0.09* 0.08* 0.07*  --   --   --   --     Estimated Creatinine Clearance: 44.5 mL/min (A) (by C-G formula based on SCr of 1.28 mg/dL (H)).   Medications:  PTA warfarin dosing 2.5 mg M/W/F and 5 mg other days.   Assessment: 82 y/o M with a h/o mitral valve repair, embolic CVA , and PAF admitted with SOB.   08/18/2018:  HL therapeutic on 1650 units/hr  INR rising to goal- 1.62 today  No issues with bleeding or IV line as reported by RN  Hgb trending down, Pltc at lower end of goal range/stable  Goal of Therapy:  INR 2-3  Heparin level 0.3-0.7 Monitor platelets by anticoagulation protocol: Yes   Plan:   Continue heparin drip at 1650 units/hr  Coumadin 5mg  po x1 today  Daily heparin level, INR and CBC  Monitor for signs of bleeding  Ashritha Desrosiers, Lavonia Drafts, PharmD 08/18/2018,11:41 AM

## 2018-08-18 NOTE — Evaluation (Signed)
Clinical/Bedside Swallow Evaluation Patient Details  Name: [name] MRN: [id number] Date of Birth: 1935/10/26  Today's Date: 08/18/2018 Time: SLP Start Time (ACUTE ONLY): 1425 SLP Stop Time (ACUTE ONLY): 1440 SLP Time Calculation (min) (ACUTE ONLY): 15 min  Past Medical History:  Past Medical History:  Diagnosis Date  . Adrenal adenoma   . Atrial fibrillation (HCC)    Rate control + Coumadin  . Atrial flutter ([place])   . CAD (coronary artery disease)    Lexiscan Myoview (01/2014): Lateral soft tissue attenuation, no ischemia, not gated, low risk  . Cancer ([place])   . CAP (community acquired pneumonia)    Hospitalized 10/2014  . Chronic diastolic heart failure ([place])   . Chronic hypoxemic respiratory failure (HCC)    from COPD  . Chronic renal insufficiency, stage III (moderate) (HCC)    CrCl 50s  . COPD (chronic obstructive pulmonary disease) (HCC)    oxygen-dependent (2.5 L 24/7).  Improved on starting chronic prednisone 06/2017 (increased to 20 mg qd 08/2017)  Progressive worsening dyspnea through 2019: 3 L oxygen, 30mg  prednisone qd chronically as of 07/2018.  . Diabetes mellitus without complication ([place]) 35/00/9381   A1c 6.5 % 05/2016  . Diverticulosis   . DOE (dyspnea on exertion)    Multifactorial: deconditioning, COPD, obesity hypoventilation syndrome.  Marland Kitchen Dyslipidemia   . Embolic stroke ([place])    d/t endocarditis 2008  . Hemorrhoids   . History of mitral valve replacement    needs SBE prophylaxis  . History of prostate cancer remote past   f/u by Dr. Claris Che; no recurrence as of f/u 10/2015.  PSA rising from 2013 to 04/2018 (PSA 04/25/18=18.49): urol to rpt PSA and check CT and bone scan  approx 10/2018.  Marland Kitchen HTN (hypertension)   . Left lower lobe pulmonary nodule July/Aug 2019.   04/2018: enlarging irregular nodular opacity.  CT surgery-->PET scan: inflamm/post-infect vs malignant. Plan f/u CT chest 3 mo per CT surg.  . Mediastinal lymphadenopathy   . Pulmonary hypertension  ([place]) 01/09/2015   secondary  . Right thyroid nodule   . Thrombocytopenia ([place])   . TIA (transient ischemic attack) 03/25/2011   ? of due to transient diplopia  . Ulcerative colitis    left-sided/segmental, associated with diverticulosis.  Sulfasalazine per GI (Dr. Carlean Purl).  . Warthin's tumor    Past Surgical History:  Past Surgical History:  Procedure Laterality Date  . carotid duplex dopplers  04/2014   No signif plaques; repeat 2 yrs per cardiology  . CATARACT EXTRACTION, BILATERAL    . COLONOSCOPY W/ BIOPSIES  12/19/2007   left-sided colitis, diverticulosis, hemorrhoids  . FLEXIBLE SIGMOIDOSCOPY  01/11/2008; 03/19/2010   2009 and 2011:segmental left colitis, diverticulosis, hemorrhoids  . INGUINAL HERNIA REPAIR     right  . MITRAL VALVE REPAIR  09/22/04   and modified Cox-Maze IV   . MITRAL VALVE REPAIR  09/14/05   redo MV repair d/t endocardiits/embolic events  . PROSTATECTOMY  06/2003   Radical  . RIGHT HEART CATHETERIZATION N/A 01/01/2015   Procedure: RIGHT HEART CATH;  Surgeon: Peter M Martinique, MD;  Location: [facility] C[facility] LAB;  Service: Cardiovascular;  Laterality: N/A;  . SALIVARY GLAND SURGERY     left resection  . TRANSTHORACIC ECHOCARDIOGRAM  11/2013; 07/2017   2015: Mod LVH, EF 55-60%, no WM abnormalities, +LA dilation, +severe RV dilation and impaired systolic fxn, +increased pulm art pressures.  Valves ok.  2018- EF 60-65%, wall motion nl, s/p MV repair, mild RV syst dysf.  HPI:  Patient is an  82 y.o. male with PMH: PAF on Coumadin, CAD, chronic diastolic CHF, chronic hypoxic respiratory failure on home oxygen (2-3 L/min continuously), stage III chronic kidney disease, COPD, DM-2, HLD, embolic CVA, MVR, HTN, who presented to the ED with worsening of chronic dyspnea, orthopnea and decompensated heart failure. CTA Chest revealed bilateral lower lobe consolidation concerning for PNA. BSE ordered by MD to r/o aspiration as cause of PNA.   Assessment / Plan / Recommendation Clinical  Impression  Patient presents with an oropharyngeal swallow that is [facility] with no overt s/s of aspiration or penetration with any of the tested bolues (thin liquids, regular solids). Although this BSE cannot r/o silent aspiration, this does not appear to be a significant concern as per patient's presentation during this evaluation.  SLP Visit Diagnosis: Dysphagia, unspecified (R13.10)    Aspiration Risk  No limitations    Diet Recommendation Thin liquid;Regular   Liquid Administration via: Cup;Straw Medication Administration: Whole meds with liquid Supervision: Patient able to self feed Compensations: Minimize environmental distractions;Slow rate    Other  Recommendations Oral Care Recommendations: Oral care BID   Follow up Recommendations None      Frequency and Duration     N/A       Prognosis   N/A     Swallow Study   General Date of Onset: 08/14/18 HPI: Patient is an  81 y.o. male with PMH: PAF on Coumadin, CAD, chronic diastolic CHF, chronic hypoxic respiratory failure on home oxygen (2-3 L/min continuously), stage III chronic kidney disease, COPD, DM-2, HLD, embolic CVA, MVR, HTN, who presented to the ED with worsening of chronic dyspnea, orthopnea and decompensated heart failure. CTA Chest revealed bilateral lower lobe consolidation concerning for PNA. BSE ordered by MD to r/o aspiration as cause of PNA. Type of Study: Bedside Swallow Evaluation Previous Swallow Assessment: N/A Diet Prior to this Study: Regular;Thin liquids Temperature Spikes Noted: No History of Recent Intubation: No Behavior/Cognition: Cooperative;Alert;Pleasant mood Oral Cavity Assessment: Within Functional Limits Oral Care Completed by SLP: No Oral Cavity - Dentition: Dentures, bottom;Dentures, top Vision: Functional for self-feeding Self-Feeding Abilities: Able to feed self Patient Positioning: Upright in bed Baseline Vocal Quality: Normal Volitional Cough: Weak Volitional Swallow: Able to elicit     Oral/Motor/Sensory Function Overall Oral Motor/Sensory Function: Within functional limits   Ice Chips Ice chips: Not tested   Thin Liquid Thin Liquid: Within functional limits Presentation: Straw;Cup Other Comments: No overt s/s of aspiration or penetration via cup or straw sips of thin liquids.    Nectar Thick Nectar Thick Liquid: Not tested   Honey Thick Honey Thick Liquid: Not tested   Puree Puree: Not tested   Solid     Solid: Within functional limits     Sonia Baller, [place], CCC-SLP 08/18/18 5:24 PM

## 2018-08-18 NOTE — Progress Notes (Signed)
PROGRESS NOTE   [name]  LNL:[id number]    DOB: 1936-01-10    DOA: 08/14/2018  PCP: Tammi Sou, MD   I have briefly reviewed patients previous medical records in [facility].  Brief Narrative:  82 year old male, lives at home with his spouse, independent, usually sleeps on recliner, PMH of PAF on Coumadin, CAD, chronic diastolic CHF, chronic hypoxic respiratory failure on home oxygen 2-3 L/min continuously, stage III chronic kidney disease, COPD on chronic prednisone, DM 2, HLD, embolic CVA, MVR, HTN, presented to ED with worsening of chronic dyspnea, orthopnea, PND, has chronic leg edema, intermittent productive cough of brown sputum and occasional specks of blood.  Admitted for decompensated CHF and acute on chronic hypoxic respiratory failure.  Overnight 11/27, had episode of exertional chest pain, worsening dyspnea, NSVT versus A. fib with aberrancy on monitor, received additional Lasix 80 mg IV x1, creatinine has further increased from 1.3-1.49.  Cardiology consulted for assistance.  CHF improved.  Ongoing hemoptysis, concern for PE/lung mass (based on previous CTA chest 04/2018) hence repeated CTA chest-no PE but shows areas of bilateral lower lobe consolidation concerning for pneumonia.  Antibiotics initiated.  Improving.   Assessment & Plan:   Active Problems:   Essential hypertension, benign   CAD (coronary artery disease)   COPD (chronic obstructive pulmonary disease) with emphysema (HCC)   History of mitral valve repair   Atrial fibrillation (HCC)   Acute on chronic respiratory failure with hypoxia (HCC)   Chronic congestive heart failure (HCC)   DM2 (diabetes mellitus, type 2) (HCC)   Acute exacerbation of congestive heart failure (HCC)   HLD (hyperlipidemia)   CKD (chronic kidney disease) stage 3, GFR 30-59 ml/min (HCC)   Leukocytosis   Acute on chronic diastolic CHF (congestive heart failure) (HCC)   Acute on chronic diastolic CHF: TTE 4/08: LVEF 60-65%,  severe LVH, post MV repair and PA peak pressure 72 mmHg.  Reports compliance with medications and daily weights.  Unclear etiology.  Treated initially with IV Lasix 60 mg every 12 hours.  TTE 11/28: LVEF 50-55%, unable to evaluate LV diastolic function, RV pressure overload.  Overnight 11/27, received additional IV Lasix 80 mg for dyspnea and chest pain. Creatinine went up from 1.3-1.5.  Clinically euvolemic.  Torsemide 20 mg daily started 11/29, continue.  Cardiology signed off 11/30.  Acute on chronic hypoxic respiratory failure: Likely due to decompensated CHF and community-acquired pneumonia complicating underlying COPD and pulmonary hypertension.  Treat CHF as above and wean oxygen to prior levels as tolerated.  Had oxygen saturation of 84% per EMS.  Currently saturating in the low 90s on 3 L/min oxygen.  Concerns regarding PE, lung mass versus other etiologies.  Thereby underwent CTA chest with reduced contrast volume on 11/29 which was negative for PE but shows areas of consolidation concerning for pneumonia.  I reviewed CTA chest with pulmonology on 11/29 and no lung mass noted.  Stable.  Lobar pneumonia/community-acquired pneumonia: Patient had symptoms of productive cough prior to admission.  He had mild hemoptysis prior to admission.  CTA chest shows progressive left lower lobe collapse/consolidation and collapse/consolidation in posterior right lower lobe.  Started IV ceftriaxone and oral doxycycline 11/29.  Incentive spirometry.  Chest physical therapy.  Swallow evaluation by speech therapy to rule out aspiration as cause.  Today reports that suspected blood in sputum was most likely the color of his cough drops rather than true blood.  Discontinued cough drops and monitor sputum.  Treat with IV  antibiotics for 48 hours and then transition to oral.  COPD: No clinical bronchospasm.  Oxygen supplements and as needed bronchodilator nebulizations.  Continue chronic prednisone 30 mg daily, seems to be  a high maintenance dose and may consider gradually tapering his outpatient.  Essential hypertension: Controlled.  Hypokalemia: Replaced  Elevated troponin: Mild and flat trend.  Likely related to demand ischemia from decompensated CHF, respiratory failure and chronic kidney disease.  Due to overnight episode of chest pain, repeated troponin and flat/no change.  Paroxysmal A. Fib: CHA2DS2VAsc 6.  Rate controlled.  Continue digoxin (dose reduced by cardiology), Cardizem.  INR subtherapeutic.  Coumadin & heparin per pharmacy.  However given prior history of embolic strokes, and high embolic risk, IV heparin bridging until INR therapeutic.  Controlled ventricular rate.  INR up to 1.62 today.  NSVT versus A. fib with aberrancy: Noted overnight 11/27.  Continue telemetry.  Attempt to keep potassium greater than 4 and magnesium greater than 2.  Digoxin level 1.3, dose reduced.  CAD: Had angina-like symptoms overnight.  Troponin flat.  Management as above.  Hyperlipidemia: Continue statins.  DM2: A1c 8.8 on 07/24/2018 indicating poor control.  Increased Lantus to 15 units at bedtime.  Continue SSI.  Better.  Monitor closely and adjust insulins as needed.  I added NovoLog mealtime 3 units.  Improved DM control.  Acute Kidney Injury complicating stage III chronic kidney disease: Recent baseline creatinine 1-1.2.  Creatinine on admission 1.3 which has now increased to 1.5, likely multifactorial due to cardiorenal and aggressive diuresis.  Held Lasix .  Torsemide initiated 11/29.  Creatinine down to 1.28.  Follow BMP in a.m.  History of embolic CVA: Anticoagulation discussion as above.  Hemoptysis, mild: Likely related to CHF/pulmonary edema.  Monitor closely while on anticoagulation as discussed.  No change.  Rest of evaluation and management as discussed above.  Suspect now that he may not have had true hemoptysis.  Please see discussion above.  Leukocytosis: Suspect due to chronic steroids.  No  clinical concern for infection.  Resolved.  Thrombocytopenia: Resolved.    DVT prophylaxis: IV heparin bridging until INR therapeutic on Coumadin. Code Status: DNR Family Communication: Discussed in detail with patient and spouse at bedside.  Updated care and answered questions. Disposition: To be determined pending clinical improvement, possibly 12/2.   Consultants:  Cardiology  Procedures:  None  Antimicrobials:  None   Subjective: Dyspnea improved.  Cough decreasing.  Today he indicates that the red color in his sputum was possibly not blood and was his cough drops color.  No chest pain.  Denies any other complaints.  ROS: Stays with spouse and does not use any assistance for walking.  Otherwise negative.  Objective:  Vitals:   08/17/18 2003 08/17/18 2150 08/18/18 0618 08/18/18 0810  BP:  127/65 136/72   Pulse:  73 80   Resp:  18 16   Temp:  98.1 F (36.7 C) 98 F (36.7 C)   TempSrc:  Oral Oral   SpO2: 95% 94% 95% 96%  Weight:   78.8 kg   Height:        Examination:  General exam: Elderly male, moderately built and nourished, lying comfortably propped up in bed.  Stable Respiratory system: Occasional basal crackles.  Distant breath sounds otherwise but clear to auscultation.  No increased work of breathing. Cardiovascular system: S1 and S2 heard, irregularly irregular.  No JVD, murmurs, pedal or presacral edema.  Telemetry personally reviewed: A. fib with controlled ventricular rate/BBB morphology. Gastrointestinal  system: Abdomen is nondistended, soft and nontender. No organomegaly or masses felt. Normal bowel sounds heard.  Stable Central nervous system: Alert and oriented. No focal neurological deficits.  Stable Extremities: Symmetric 5 x 5 power. Skin: No rashes, lesions or ulcers Psychiatry: Judgement and insight appears impaired. Mood & affect appropriate.     Data Reviewed: I have personally reviewed following labs and imaging studies  CBC: Recent  Labs  Lab 08/14/18 1714 08/15/18 0613 08/16/18 0217 08/17/18 0449 08/18/18 0514  WBC 14.0* 13.8* 10.6* 10.5 10.9*  NEUTROABS 11.4*  --   --   --   --   HGB 13.5 13.4 12.6* 12.1* 11.7*  HCT 44.1 46.1 39.5 40.2 38.4*  MCV 88.7 92.0 86.2 88.9 88.7  PLT 158 149* 129* 154 782*   Basic Metabolic Panel: Recent Labs  Lab 08/14/18 1714 08/15/18 0613 08/16/18 0217 08/16/18 1449 08/17/18 0449 08/18/18 0514  NA 140 139 136  --  137 136  K 3.7 3.4* 3.5  --  4.3 4.3  CL 99 99 97*  --  101 98  CO2 31 29 29   --  25 26  GLUCOSE 259* 177* 199*  --  157* 94  BUN 17 18 27*  --  29* 23  CREATININE 1.38* 1.32* 1.49*  --  1.42* 1.28*  CALCIUM 9.2 8.8* 8.9  --  9.3 9.0  MG  --   --   --  2.2  --   --    Coagulation Profile: Recent Labs  Lab 08/14/18 0946 08/15/18 0613 08/16/18 0217 08/17/18 0449 08/18/18 0514  INR 0.9* 1.07 NOT CALCULATED 1.34 1.62   Cardiac Enzymes: Recent Labs  Lab 08/15/18 0613 08/15/18 1310 08/16/18 1006 08/16/18 1449 08/16/18 1944  TROPONINI 0.10* 0.09* 0.09* 0.08* 0.07*   CBG: Recent Labs  Lab 08/17/18 0724 08/17/18 1106 08/17/18 1705 08/17/18 2147 08/18/18 0741  GLUCAP 132* 248* 304* 139* 109*    No results found for this or any previous visit (from the past 240 hour(s)).       Radiology Studies: Ct Angio Chest Pe W Or Wo Contrast  Result Date: 08/17/2018 CLINICAL DATA:  Shortness of breath.  Concern for pulmonary embolus. EXAM: CT ANGIOGRAPHY CHEST WITH CONTRAST TECHNIQUE: Multidetector CT imaging of the chest was performed using the standard protocol during bolus administration of intravenous contrast. Multiplanar CT image reconstructions and MIPs were obtained to evaluate the vascular anatomy. CONTRAST:  25mL ISOVUE-370 IOPAMIDOL (ISOVUE-370) INJECTION 76% COMPARISON:  05/19/2018 FINDINGS: Cardiovascular: Heart is enlarged. Coronary artery calcification is evident. Atherosclerotic calcification is noted in the wall of the thoracic aorta. No  filling defects in the opacified pulmonary arteries to suggest the presence of an acute pulmonary embolus. Evaluation of lower lobe segmental and subsegmental branches is limited by motion artifact from patient breathing. Mediastinum/Nodes: Scattered small lymph nodes are seen in the mediastinum. 10 mm short axis anterior left hilar lymph node is stable. 13 mm short axis subcarinal lymph node is unchanged. 19 mm short axis lymph node inferior right hilum is stable. The esophagus has normal imaging features. There is no axillary lymphadenopathy. Lungs/Pleura: The central tracheobronchial airways are patent. Centrilobular and paraseptal emphysema noted bilaterally. Fine architectural detail of the lungs is obscured by continuous breathing motion during image acquisition. 4 mm right upper lobe pulmonary nodule seen previously is stable (29/10) interval development of patchy airspace disease in the posterior right lower lobe. The confluent left lower lobe opacity seen previously shows progression medially (image 81/10). No substantial pleural  effusion. Upper Abdomen: Stable 2.2 cm left adrenal nodule. Low attenuation in this nodule is compatible with benign adrenal adenoma. Musculoskeletal: No worrisome lytic or sclerotic osseous abnormality. Review of the MIP images confirms the above findings. IMPRESSION: 1. No CT evidence for acute pulmonary embolus. 2. Progressive left lower lobe collapse/consolidation since prior study. This is associated with new collapse/consolidation in the posterior right lower lobe. 3. Stable mediastinal and right hilar lymphadenopathy. 4.  Emphysema. (ICD10-J43.9) 5.  Aortic Atherosclerois (ICD10-170.0) Electronically Signed   By: Misty Stanley M.D.   On: 08/17/2018 13:13        Scheduled Meds: . arformoterol  15 mcg Nebulization BID  . budesonide  0.5 mg Nebulization BID  . digoxin  0.0625 mg Oral Daily  . diltiazem  240 mg Oral Daily  . doxycycline  100 mg Oral Q12H  . insulin  aspart  0-5 Units Subcutaneous QHS  . insulin aspart  0-9 Units Subcutaneous TID WC  . insulin aspart  3 Units Subcutaneous TID WC  . insulin glargine  15 Units Subcutaneous QHS  . mouth rinse  15 mL Mouth Rinse BID  . pravastatin  40 mg Oral q1800  . predniSONE  30 mg Oral Q breakfast  . torsemide  20 mg Oral Daily  . umeclidinium bromide  1 puff Inhalation Daily  . warfarin  5 mg Oral ONCE-1800  . Warfarin - Pharmacist Dosing Inpatient   Does not apply q1800   Continuous Infusions: . cefTRIAXone (ROCEPHIN)  IV Stopped (08/17/18 1940)  . heparin 1,650 Units/hr (08/18/18 0300)     LOS: 3 days     Vernell Leep, MD, FACP, [facility] Pager [phone]  If 7PM-7AM, please contact night-coverage [email or web address] Password [email or web address] 08/18/2018, 11:51 AM

## 2018-08-18 NOTE — Progress Notes (Signed)
Physical Therapy Treatment Patient Details Name: [name] MRN: [id number] DOB: 1936-08-29 Today's Date: 08/18/2018    History of Present Illness 82 year old male, , PMH of PAF on Coumadin, CAD, chronic diastolic CHF, chronic hypoxic respiratory failure on home oxygen 2-3 L/min continuously, stage III chronic kidney disease, COPD , DM 2, HLD, embolic CVA, MVR, HTN, presented to ED with worsening of chronic dyspnea, orthopnea, decompensated heart failure    PT Comments    Patient progressing with ambulation in hallway.  Remains limited by respiratory status.  Improved activity tolerance after cues for slower pace, and seated rest with cues for PLB and use of incentive and flutter valve.  Will need assist at home and follow up HHPT.    Follow Up Recommendations  Home health PT;Supervision/Assistance - 24 hour     Equipment Recommendations  None recommended by PT    Recommendations for Other Services       Precautions / Restrictions Precautions Precautions: Fall Precaution Comments: on O2 chronic    Mobility  Bed Mobility               General bed mobility comments: up on BSC NT in room  Transfers Overall transfer level: Needs assistance Equipment used: Rolling walker (2 wheeled) Transfers: Sit to/from Stand Sit to Stand: Supervision         General transfer comment: assist and cues for safety due to eager to walk prior to lines made portable  Ambulation/Gait Ambulation/Gait assistance: Min assist Gait Distance (Feet): 50 Feet Assistive device: Rolling walker (2 wheeled) Gait Pattern/deviations: Step-through pattern;Step-to pattern;Decreased stride length;Shuffle;Trunk flexed     General Gait Details: Ambulated around the bed and pt SOB and with SpO2 88% on 3L O2.  Sat in chair to perform IS and flutter valve cues for PLB up to 92%.  Ambulated in hallway slow pace with cues for walker use, posture, forward gaze and safety with turns, SpO2 92%   Stairs              Wheelchair Mobility    Modified Rankin (Stroke Patients Only)       Balance Overall balance assessment: Needs assistance   Sitting balance-Leahy Scale: Good     Standing balance support: Bilateral upper extremity supported Standing balance-Leahy Scale: Poor Standing balance comment: UE support needed for balance                            Cognition Arousal/Alertness: Awake/alert Behavior During Therapy: WFL for tasks assessed/performed Overall Cognitive Status: Within Functional Limits for tasks assessed                                        Exercises      General Comments General comments (skin integrity, edema, etc.): Daughter, wife and sister visiting; encouraging his participation in breathing ex's; placed water in for O2 due to blood when blowing nose      Pertinent Vitals/Pain Pain Assessment: No/denies pain    Home Living                      Prior Function            PT Goals (current goals can now be found in the care plan section) Progress towards PT goals: Progressing toward goals    Frequency    Min 3X/week  PT Plan Current plan remains appropriate;Frequency needs to be updated    Co-evaluation              AM-PAC PT "6 Clicks" Mobility   Outcome Measure  Help needed turning from your back to your side while in a flat bed without using bedrails?: A Little Help needed moving from lying on your back to sitting on the side of a flat bed without using bedrails?: A Little Help needed moving to and from a bed to a chair (including a wheelchair)?: A Little Help needed standing up from a chair using your arms (e.g., wheelchair or bedside chair)?: A Little Help needed to walk in hospital room?: A Little Help needed climbing 3-5 steps with a railing? : A Lot 6 Click Score: 17    End of Session Equipment Utilized During Treatment: Gait belt;Oxygen Activity Tolerance: Patient  tolerated treatment well Patient left: with call bell/phone within reach;with chair alarm set;with family/visitor present   PT Visit Diagnosis: Unsteadiness on feet (R26.81)     Time: 3734-2876 PT Time Calculation (min) (ACUTE ONLY): 25 min  Charges:  $Gait Training: 8-22 mins $Therapeutic Activity: 8-22 mins                     Magda Kiel, [facility] [phone] 08/18/2018    Reginia Naas 08/18/2018, 4:37 PM

## 2018-08-18 NOTE — Progress Notes (Signed)
Pt has collected sputum in his room. He initially reported that two days ago he had hemoptysis. This morning, pt reported that what he believed to be blood is just dye from his cherry cough drops. I assessed sputum, educated the pt that testing would need to confirm, and informed on-call MD. Will  continue to monitor.

## 2018-08-19 LAB — GLUCOSE, CAPILLARY
GLUCOSE-CAPILLARY: 242 mg/dL — AB (ref 70–99)
Glucose-Capillary: 107 mg/dL — ABNORMAL HIGH (ref 70–99)
Glucose-Capillary: 194 mg/dL — ABNORMAL HIGH (ref 70–99)
Glucose-Capillary: 228 mg/dL — ABNORMAL HIGH (ref 70–99)

## 2018-08-19 LAB — BASIC METABOLIC PANEL
Anion gap: 10 (ref 5–15)
BUN: 21 mg/dL (ref 8–23)
CO2: 27 mmol/L (ref 22–32)
Calcium: 9.1 mg/dL (ref 8.9–10.3)
Chloride: 97 mmol/L — ABNORMAL LOW (ref 98–111)
Creatinine, Ser: 1.17 mg/dL (ref 0.61–1.24)
GFR calc Af Amer: >60 mL/min (ref >60)
GFR calc non Af Amer: 59 mL/min — ABNORMAL LOW (ref >60)
Glucose, Bld: 138 mg/dL — ABNORMAL HIGH (ref 70–99)
Potassium: 3.9 mmol/L (ref 3.5–5.1)
Sodium: 134 mmol/L — ABNORMAL LOW (ref 135–145)

## 2018-08-19 LAB — HEPARIN LEVEL (UNFRACTIONATED): Heparin Unfractionated: 0.71 IU/mL — ABNORMAL HIGH (ref 0.30–0.70)

## 2018-08-19 LAB — CBC
HCT: 36.3 % — ABNORMAL LOW (ref 39.0–52.0)
Hemoglobin: 11.2 g/dL — ABNORMAL LOW (ref 13.0–17.0)
MCH: 26.3 pg (ref 26.0–34.0)
MCHC: 30.9 g/dL (ref 30.0–36.0)
MCV: 85.2 fL (ref 80.0–100.0)
Platelets: 165 10*3/uL (ref 150–400)
RBC: 4.26 MIL/uL (ref 4.22–5.81)
RDW: 15.7 % — ABNORMAL HIGH (ref 11.5–15.5)
WBC: 10.3 10*3/uL (ref 4.0–10.5)
nRBC: 0 % (ref 0.0–0.2)

## 2018-08-19 LAB — PROTIME-INR
INR: 2.04
Prothrombin Time: 22.7 seconds — ABNORMAL HIGH (ref 11.4–15.2)

## 2018-08-19 MED ORDER — WARFARIN SODIUM 5 MG PO TABS
5.0000 mg | ORAL_TABLET | Freq: Once | ORAL | Status: AC
  Administered 2018-08-19: 5 mg via ORAL
  Filled 2018-08-19: qty 1

## 2018-08-19 MED ORDER — HEPARIN (PORCINE) 25000 UT/250ML-% IV SOLN: 1500.0000 [IU]/h | INTRAVENOUS | Status: DC

## 2018-08-19 NOTE — Progress Notes (Signed)
ANTICOAGULATION CONSULT NOTE - Follow Up Consult  Pharmacy Consult for Warfarin, Heparin bridge Indication: atrial fibrillation  No Known Allergies  Patient Measurements: Height: 5\' 8"  (172.7 cm) Weight: 173 lb 12.8 oz (78.8 kg) IBW/kg (Calculated) : 68.4  Vital Signs: Temp: 97.6 F (36.4 C) (11/30 2106) Temp Source: Oral (11/30 2106) BP: 135/77 (11/30 2106) Pulse Rate: 58 (11/30 2106)  Labs: Recent Labs    08/16/18 1006 08/16/18 1449 08/16/18 1944  08/17/18 0449  08/17/18 2214 08/18/18 0514 08/19/18 0421  HGB  --   --   --    < > 12.1*  --   --  11.7* 11.2*  HCT  --   --   --   --  40.2  --   --  38.4* 36.3*  PLT  --   --   --   --  154  --   --  148* 165  LABPROT  --   --   --   --  16.4*  --   --  19.0* 22.7*  INR  --   --   --   --  1.34  --   --  1.62 2.04  HEPARINUNFRC 0.12*  --  0.17*  --  0.58   < > 0.63 0.40 0.71*  CREATININE  --   --   --   --  1.42*  --   --  1.28* 1.17  TROPONINI 0.09* 0.08* 0.07*  --   --   --   --   --   --    < > = values in this interval not displayed.    Estimated Creatinine Clearance: 48.7 mL/min (by C-G formula based on SCr of 1.17 mg/dL).   Medications:  PTA warfarin dosing 2.5 mg M/W/F and 5 mg other days.   Assessment: 82 y/o M with a h/o mitral valve repair, embolic CVA , and PAF admitted with SOB.   11/30  HL therapeutic on 1650 units/hr  INR rising to goal- 1.62 today  No issues with bleeding or IV line as reported by RN  Hgb trending down, Pltc at lower end of goal range/stable Today, 12/1  0421 HL= 0.71 slightly above goal, no bleeding or infusion issues per RN.   INR=2.04 , H/H 11.2/36.3 and Plts = 165  Goal of Therapy:  INR 2-3  Heparin level 0.3-0.7 Monitor platelets by anticoagulation protocol: Yes   Plan:   Decrease heparin drip to 1500 units/hr  Daily heparin level, INR and CBC  Monitor for signs of bleeding  Dorrene German, PharmD 08/19/2018,5:38 AM

## 2018-08-19 NOTE — Progress Notes (Signed)
PROGRESS NOTE   [name]  NOI:[id number]    DOB: 06/10/1936    DOA: 08/14/2018  PCP: Tammi Sou, MD   I have briefly reviewed patients previous medical records in [facility].  Brief Narrative:  82 year old male, lives at home with his spouse, independent, usually sleeps on recliner, PMH of PAF on Coumadin, CAD, chronic diastolic CHF, chronic hypoxic respiratory failure on home oxygen 2-3 L/min continuously, stage III chronic kidney disease, COPD on chronic prednisone, DM 2, HLD, embolic CVA, MVR, HTN, presented to ED with worsening of chronic dyspnea, orthopnea, PND, has chronic leg edema, intermittent productive cough of brown sputum and occasional specks of blood.  Admitted for decompensated CHF and acute on chronic hypoxic respiratory failure.  Overnight 11/27, had episode of exertional chest pain, worsening dyspnea, NSVT versus A. fib with aberrancy on monitor, received additional Lasix 80 mg IV x1, creatinine has further increased from 1.3-1.49.  Cardiology consulted for assistance.  CHF improved.  Ongoing hemoptysis, concern for PE/lung mass (based on previous CTA chest 04/2018) hence repeated CTA chest-no PE but shows areas of bilateral lower lobe consolidation concerning for pneumonia.  Antibiotics initiated.  Improving.   Assessment & Plan:   Active Problems:   Essential hypertension, benign   CAD (coronary artery disease)   COPD (chronic obstructive pulmonary disease) with emphysema (HCC)   History of mitral valve repair   Atrial fibrillation (HCC)   Acute on chronic respiratory failure with hypoxia (HCC)   Chronic congestive heart failure (HCC)   DM2 (diabetes mellitus, type 2) (HCC)   Acute exacerbation of congestive heart failure (HCC)   HLD (hyperlipidemia)   CKD (chronic kidney disease) stage 3, GFR 30-59 ml/min (HCC)   Leukocytosis   Acute on chronic diastolic CHF (congestive heart failure) (HCC)   Acute on chronic diastolic CHF: TTE 6/94: LVEF 60-65%,  severe LVH, post MV repair and PA peak pressure 72 mmHg.  Reports compliance with medications and daily weights.  Unclear etiology.  Treated initially with IV Lasix 60 mg every 12 hours.  TTE 11/28: LVEF 50-55%, unable to evaluate LV diastolic function, RV pressure overload.  Overnight 11/27, received additional IV Lasix 80 mg for dyspnea and chest pain. Creatinine went up from 1.3-1.5.  Clinically euvolemic.  Torsemide 20 mg daily started 11/29, continue.  Cardiology signed off 11/30.  Stable.  Acute on chronic hypoxic respiratory failure: Likely due to decompensated CHF and community-acquired pneumonia complicating underlying COPD and pulmonary hypertension.  Treat CHF as above and wean oxygen to prior levels as tolerated.  Had oxygen saturation of 84% per EMS.  Currently saturating in the low 90s on 3 L/min oxygen.  Concerns regarding PE, lung mass versus other etiologies.  Thereby underwent CTA chest with reduced contrast volume on 11/29 which was negative for PE but shows areas of consolidation concerning for pneumonia.  I reviewed CTA chest with pulmonology on 11/29 and no lung mass noted.  Patient is back on prior home level of oxygen.  Improved and stable.  Requested nursing to ambulate/mobilize patient.  Lobar pneumonia/community-acquired pneumonia: Patient had symptoms of productive cough prior to admission.  He had mild hemoptysis prior to admission.  CTA chest shows progressive left lower lobe collapse/consolidation and collapse/consolidation in posterior right lower lobe.  Started IV ceftriaxone and oral doxycycline 11/29.  Incentive spirometry.  Speech therapy evaluation noted, on regular diet and thin liquids.  No true hemoptysis- red color likely due to cough drops!.  Discontinued cough drops and monitor  sputum.  Treat with IV antibiotics for 48 hours and then transition to oral possibly 12/2.  COPD: No clinical bronchospasm.  Oxygen supplements and as needed bronchodilator nebulizations.   Continue chronic prednisone 30 mg daily, seems to be a high maintenance dose and may consider gradually tapering his outpatient.  Essential hypertension: Controlled.  Hypokalemia: Replaced  Elevated troponin: Mild and flat trend.  Likely related to demand ischemia from decompensated CHF, respiratory failure and chronic kidney disease.  Due to overnight episode of chest pain, repeated troponin and flat/no change.  Paroxysmal A. Fib: CHA2DS2VAsc 6.  Rate controlled.  Continue digoxin (dose reduced by cardiology), Cardizem.  INR subtherapeutic.  Coumadin & heparin per pharmacy.  However given prior history of embolic strokes, and high embolic risk, IV heparin bridging until INR therapeutic.  Controlled ventricular rate.  INR >2 today.  Discontinued IV heparin bridge.  NSVT versus A. fib with aberrancy: Noted overnight 11/27.  Continue telemetry.  Attempt to keep potassium greater than 4 and magnesium greater than 2.  Digoxin level 1.3, dose reduced.  CAD: Had angina-like symptoms overnight.  Troponin flat.  Management as above.  Hyperlipidemia: Continue statins.  DM2: A1c 8.8 on 07/24/2018 indicating poor control.  Increased Lantus to 15 units at bedtime.  Continue SSI.  Better.  Monitor closely and adjust insulins as needed.  I added NovoLog mealtime 3 units.  Improved DM control.  Acute Kidney Injury complicating stage III chronic kidney disease: Recent baseline creatinine 1-1.2.  Creatinine on admission 1.3 which has now increased to 1.5, likely multifactorial due to cardiorenal and aggressive diuresis.  Held Lasix .  Torsemide initiated 11/29.  Creatinine down to 1.1.  Follow BMP in a.m.  History of embolic CVA: Anticoagulation discussion as above.  Hemoptysis, mild: Suspect not true hemoptysis but due to colored cough drops. Now resolved.  Leukocytosis: Suspect due to chronic steroids.  No clinical concern for infection.  Resolved.  Thrombocytopenia: Resolved.    DVT prophylaxis: IV  heparin bridging until INR therapeutic on Coumadin. Code Status: DNR Family Communication: Discussed in detail with patient and spouse at bedside.  Updated care and answered questions. Disposition: To be determined pending clinical improvement, possibly 12/2.   Consultants:  Cardiology  Procedures:  None  Antimicrobials:  None   Subjective: Still reports dyspnea on exertion but has not been out of bed much.  No chest pain.  Cough with minimal brown sputum but no blood.  ROS: Stays with spouse and does not use any assistance for walking.  Otherwise negative.  Objective:  Vitals:   08/18/18 2204 08/19/18 0540 08/19/18 0818 08/19/18 1425  BP:  133/74  133/71  Pulse:  81  74  Resp:  20  16  Temp:  97.7 F (36.5 C)  97.7 F (36.5 C)  TempSrc:  Oral  Oral  SpO2: 95% 96% 98% 95%  Weight:  78.7 kg    Height:        Examination:  General exam: Elderly male, moderately built and nourished, lying comfortably propped up in bed.  Stable Respiratory system: Occasional basal crackles.  Distant breath sounds otherwise but clear to auscultation.  No increased work of breathing.  No change/stable. Cardiovascular system: S1 and S2 heard, irregularly irregular.  No JVD, murmurs, pedal or presacral edema.  Telemetry personally reviewed: A. fib with controlled ventricular rate, BBB morphology. Gastrointestinal system: Abdomen is nondistended, soft and nontender. No organomegaly or masses felt. Normal bowel sounds heard.  Stable Central nervous system: Alert and oriented.  No focal neurological deficits.  Stable Extremities: Symmetric 5 x 5 power. Skin: No rashes, lesions or ulcers Psychiatry: Judgement and insight appears impaired. Mood & affect appropriate.     Data Reviewed: I have personally reviewed following labs and imaging studies  CBC: Recent Labs  Lab 08/14/18 1714 08/15/18 0613 08/16/18 0217 08/17/18 0449 08/18/18 0514 08/19/18 0421  WBC 14.0* 13.8* 10.6* 10.5 10.9*  10.3  NEUTROABS 11.4*  --   --   --   --   --   HGB 13.5 13.4 12.6* 12.1* 11.7* 11.2*  HCT 44.1 46.1 39.5 40.2 38.4* 36.3*  MCV 88.7 92.0 86.2 88.9 88.7 85.2  PLT 158 149* 129* 154 148* 161   Basic Metabolic Panel: Recent Labs  Lab 08/15/18 0613 08/16/18 0217 08/16/18 1449 08/17/18 0449 08/18/18 0514 08/19/18 0421  NA 139 136  --  137 136 134*  K 3.4* 3.5  --  4.3 4.3 3.9  CL 99 97*  --  101 98 97*  CO2 29 29  --  25 26 27   GLUCOSE 177* 199*  --  157* 94 138*  BUN 18 27*  --  29* 23 21  CREATININE 1.32* 1.49*  --  1.42* 1.28* 1.17  CALCIUM 8.8* 8.9  --  9.3 9.0 9.1  MG  --   --  2.2  --   --   --    Coagulation Profile: Recent Labs  Lab 08/15/18 0613 08/16/18 0217 08/17/18 0449 08/18/18 0514 08/19/18 0421  INR 1.07 NOT CALCULATED 1.34 1.62 2.04   Cardiac Enzymes: Recent Labs  Lab 08/15/18 0613 08/15/18 1310 08/16/18 1006 08/16/18 1449 08/16/18 1944  TROPONINI 0.10* 0.09* 0.09* 0.08* 0.07*   CBG: Recent Labs  Lab 08/18/18 0741 08/18/18 1151 08/18/18 1729 08/18/18 2105 08/19/18 0753  GLUCAP 109* 186* 349* 197* 107*    No results found for this or any previous visit (from the past 240 hour(s)).       Radiology Studies: No results found.      Scheduled Meds: . arformoterol  15 mcg Nebulization BID  . benzonatate  200 mg Oral TID  . budesonide  0.5 mg Nebulization BID  . digoxin  0.0625 mg Oral Daily  . diltiazem  240 mg Oral Daily  . doxycycline  100 mg Oral Q12H  . insulin aspart  0-5 Units Subcutaneous QHS  . insulin aspart  0-9 Units Subcutaneous TID WC  . insulin aspart  3 Units Subcutaneous TID WC  . insulin glargine  15 Units Subcutaneous QHS  . mouth rinse  15 mL Mouth Rinse BID  . pravastatin  40 mg Oral q1800  . predniSONE  30 mg Oral Q breakfast  . torsemide  20 mg Oral Daily  . umeclidinium bromide  1 puff Inhalation Daily  . warfarin  5 mg Oral ONCE-1800  . Warfarin - Pharmacist Dosing Inpatient   Does not apply q1800    Continuous Infusions: . cefTRIAXone (ROCEPHIN)  IV Stopped (08/18/18 1749)     LOS: 4 days     Vernell Leep, MD, FACP, [facility]. [facility] Pager [phone]  If 7PM-7AM, please contact night-coverage [email or web address] Password [email or web address] 08/19/2018, 4:05 PM

## 2018-08-19 NOTE — Progress Notes (Signed)
ANTICOAGULATION CONSULT NOTE - Follow Up Consult  Pharmacy Consult for Warfarin, Heparin bridge Indication: atrial fibrillation  No Known Allergies  Patient Measurements: Height: 5\' 8"  (172.7 cm) Weight: 173 lb 9.6 oz (78.7 kg) IBW/kg (Calculated) : 68.4  Vital Signs: Temp: 97.7 F (36.5 C) (12/01 0540) Temp Source: Oral (12/01 0540) BP: 133/74 (12/01 0540) Pulse Rate: 81 (12/01 0540)  Labs: Recent Labs    08/16/18 1006 08/16/18 1449 08/16/18 1944  08/17/18 0449  08/17/18 2214 08/18/18 0514 08/19/18 0421  HGB  --   --   --    < > 12.1*  --   --  11.7* 11.2*  HCT  --   --   --   --  40.2  --   --  38.4* 36.3*  PLT  --   --   --   --  154  --   --  148* 165  LABPROT  --   --   --   --  16.4*  --   --  19.0* 22.7*  INR  --   --   --   --  1.34  --   --  1.62 2.04  HEPARINUNFRC 0.12*  --  0.17*  --  0.58   < > 0.63 0.40 0.71*  CREATININE  --   --   --   --  1.42*  --   --  1.28* 1.17  TROPONINI 0.09* 0.08* 0.07*  --   --   --   --   --   --    < > = values in this interval not displayed.    Estimated Creatinine Clearance: 48.7 mL/min (by C-G formula based on SCr of 1.17 mg/dL).   Medications:  PTA warfarin dosing 2.5 mg M/W/F and 5 mg other days.   Assessment: 82 y/o M with a h/o mitral valve repair, embolic CVA , and PAF admitted with SOB. INR sub-therapeutic on admission.    08/19/2018:  INR at goal today- 2.04  No issues with bleeding or IV line as reported by RN  Hgb trending down, Pltc at lower end of goal range/stable  Goal of Therapy:  INR 2-3  Heparin level 0.3-0.7 Monitor platelets by anticoagulation protocol: Yes   Plan:   D/C heparin overlap since INR therapeutic  Coumadin 5mg  po x1 today (per home regimen)  Daily INR   Monitor for signs of bleeding  Rosamary Boudreau, Lavonia Drafts, PharmD 08/19/2018,9:10 AM

## 2018-08-20 LAB — BASIC METABOLIC PANEL
Anion gap: 9 (ref 5–15)
BUN: 22 mg/dL (ref 8–23)
CO2: 27 mmol/L (ref 22–32)
Calcium: 9.3 mg/dL (ref 8.9–10.3)
Chloride: 100 mmol/L (ref 98–111)
Creatinine, Ser: 1.26 mg/dL — ABNORMAL HIGH (ref 0.61–1.24)
GFR calc Af Amer: >60 mL/min (ref >60)
GFR, EST NON AFRICAN AMERICAN: 54 mL/min — AB (ref >60)
Glucose, Bld: 111 mg/dL — ABNORMAL HIGH (ref 70–99)
POTASSIUM: 3.9 mmol/L (ref 3.5–5.1)
Sodium: 136 mmol/L (ref 135–145)

## 2018-08-20 LAB — PROTIME-INR
INR: 2.13
Prothrombin Time: 23.6 seconds — ABNORMAL HIGH (ref 11.4–15.2)

## 2018-08-20 LAB — GLUCOSE, CAPILLARY
Glucose-Capillary: 94 mg/dL (ref 70–99)
Glucose-Capillary: 181 mg/dL — ABNORMAL HIGH (ref 70–99)

## 2018-08-20 LAB — MAGNESIUM: Magnesium: 2.1 mg/dL (ref 1.7–2.4)

## 2018-08-20 MED ORDER — CEFDINIR 300 MG PO CAPS: 300.0000 mg | ORAL_CAPSULE | Freq: Two times a day (BID) | ORAL | 0 refills | Status: DC

## 2018-08-20 MED ORDER — BENZONATATE 200 MG PO CAPS: 200.0000 mg | ORAL_CAPSULE | Freq: Three times a day (TID) | ORAL | 0 refills | Status: AC

## 2018-08-20 MED ORDER — TORSEMIDE 20 MG PO TABS: 20.0000 mg | ORAL_TABLET | Freq: Every day | ORAL | 0 refills | Status: DC

## 2018-08-20 MED ORDER — WARFARIN SODIUM 2.5 MG PO TABS: 2.5000 mg | ORAL_TABLET | Freq: Once | ORAL | Status: DC

## 2018-08-20 MED ORDER — DOXYCYCLINE HYCLATE 100 MG PO TABS: 100.0000 mg | ORAL_TABLET | Freq: Two times a day (BID) | ORAL | 0 refills | Status: DC

## 2018-08-20 MED ORDER — DIGOXIN 125 MCG PO TABS: 0.0625 mg | ORAL_TABLET | Freq: Every day | ORAL | 0 refills | Status: AC

## 2018-08-20 MED ORDER — WARFARIN SODIUM 5 MG PO TABS: 5.0000 mg | ORAL_TABLET | Freq: Once | ORAL | Status: DC

## 2018-08-20 NOTE — Progress Notes (Signed)
Physical Therapy Treatment Patient Details Name: [name] MRN: [id number] DOB: May 27, 1936 Today's Date: 08/20/2018    History of Present Illness 82 year old male, , PMH of PAF on Coumadin, CAD, chronic diastolic CHF, chronic hypoxic respiratory failure on home oxygen 2-3 L/min continuously, stage III chronic kidney disease, COPD , DM 2, HLD, embolic CVA, MVR, HTN, presented to ED with worsening of chronic dyspnea, orthopnea, decompensated heart failure    PT Comments    Progressing slowly with mobility. Pt a little worried about mobilizing since he hasn't walked very much during this hospital stay. Overall, he was Supervision-Min guard assist for mobility. O2 sat level dropped to 89% on 3L [place] during ambulation. He fatigues fairly easily with activity. He stated he has access to a wheelchair at home, if needed.     Follow Up Recommendations  Home health PT;Supervision/Assistance - 24 hour     Equipment Recommendations  None recommended by PT    Recommendations for Other Services       Precautions / Restrictions Precautions Precautions: Fall Precaution Comments: O2 dep-3L at baseline Restrictions Weight Bearing Restrictions: No    Mobility  Bed Mobility Overal bed mobility: Needs Assistance Bed Mobility: Supine to Sit     Supine to sit: Supervision     General bed mobility comments: for safety, lines  Transfers Overall transfer level: Needs assistance Equipment used: Rolling walker (2 wheeled);None Transfers: Sit to/from [employer] Sit to Stand: Supervision   Squat pivot transfers: Supervision     General transfer comment: Pt performed squat pivot, bed to bsc. Then he stood from bsc with RW.   Ambulation/Gait Ambulation/Gait assistance: Min guard Gait Distance (Feet): 50 Feet Assistive device: Rolling walker (2 wheeled) Gait Pattern/deviations: Step-through pattern;Decreased stride length     General Gait Details: Close guard for safety.  On 3L [address], O2 sat level dropped to 89%. Dyspnea 3/4 with 2 brief standing rest breaks.    Stairs             Wheelchair Mobility    Modified Rankin (Stroke Patients Only)       Balance Overall balance assessment: Needs assistance         Standing balance support: Bilateral upper extremity supported Standing balance-Leahy Scale: Poor                              Cognition Arousal/Alertness: Awake/alert Behavior During Therapy: WFL for tasks assessed/performed Overall Cognitive Status: Within Functional Limits for tasks assessed                                        Exercises      General Comments        Pertinent Vitals/Pain Pain Assessment: No/denies pain    Home Living                      Prior Function            PT Goals (current goals can now be found in the care plan section) Progress towards PT goals: Progressing toward goals    Frequency    Min 3X/week      PT Plan Current plan remains appropriate    Co-evaluation              AM-PAC PT "6 Clicks" Mobility   Outcome Measure  Help needed turning from your back to your side while in a flat bed without using bedrails?: None Help needed moving from lying on your back to sitting on the side of a flat bed without using bedrails?: None Help needed moving to and from a bed to a chair (including a wheelchair)?: A Little Help needed standing up from a chair using your arms (e.g., wheelchair or bedside chair)?: A Little Help needed to walk in hospital room?: A Little Help needed climbing 3-5 steps with a railing? : A Little 6 Click Score: 20    End of Session Equipment Utilized During Treatment: Gait belt;Oxygen Activity Tolerance: Patient limited by fatigue Patient left: in bed;with call bell/phone within reach;with family/visitor present   PT Visit Diagnosis: Muscle weakness (generalized) (M62.81);Difficulty in walking, not elsewhere classified  (R26.2)     Time: 1152-1206 PT Time Calculation (min) (ACUTE ONLY): 14 min  Charges:  $Gait Training: 8-22 mins                        Weston Anna, PT Acute Rehabilitation Services Pager: [phone] Office: [phone]

## 2018-08-20 NOTE — Progress Notes (Signed)
ANTICOAGULATION CONSULT NOTE - Follow Up Consult  Pharmacy Consult for Warfarin Indication: atrial fibrillation  No Known Allergies  Patient Measurements: Height: 5\' 8"  (172.7 cm) Weight: 170 lb 12.8 oz (77.5 kg) IBW/kg (Calculated) : 68.4  Vital Signs: Temp: 97.6 F (36.4 C) (12/02 0618) Temp Source: Oral (12/02 0618) BP: 129/79 (12/02 0618) Pulse Rate: 49 (12/02 0618)  Labs: Recent Labs    08/17/18 2214 08/18/18 0514 08/19/18 0421 08/20/18 0449  HGB  --  11.7* 11.2*  --   HCT  --  38.4* 36.3*  --   PLT  --  148* 165  --   LABPROT  --  19.0* 22.7* 23.6*  INR  --  1.62 2.04 2.13  HEPARINUNFRC 0.63 0.40 0.71*  --   CREATININE  --  1.28* 1.17 1.26*    Estimated Creatinine Clearance: 45.2 mL/min (A) (by C-G formula based on SCr of 1.26 mg/dL (H)).   Medications:  PTA warfarin dosing 2.5 mg M/W/F and 5 mg other days.   Assessment: 82 y/o M with a h/o mitral valve repair, embolic CVA , and PAF admitted with SOB. INR (0.9) sub-therapeutic on admission.    Patient was started on bridge therapy with IV heparin on 11/27. Heparin discontinued on 12/1 due to therapeutic INR.  08/20/2018:  INR at goal today- 2.13  No signs/symptoms of bleeding reported. No hemoptysis per MD note 12/1.  Patient on day #4 doxycycline + ceftriaxone. Antibiotics have the potential to enhance effects of warfarin.  Goal of Therapy:  INR 2-3  Monitor platelets by anticoagulation protocol: Yes   Plan:   Warfarin 2.5mg  PO x1 today (per home regimen)  Daily INR while inpatient  Recheck CBC with AM labs tomorrow  Monitor for signs of bleeding  Recommend checking INR within 48 hours after discharge as patient is on antibiotics.  Lenis Noon, PharmD Clinical Pharmacist 08/20/2018,12:41 PM

## 2018-08-20 NOTE — Care Management Note (Signed)
Case Management Note  Patient Details  Name: [name] MRN: [id number] Date of Birth: 05-Sep-1936  Subjective/Objective:                    Action/Plan:   Expected Discharge Date:  08/20/18               Expected Discharge Plan:  [place]  In-House Referral:     Discharge planning Services  CM Consult  Post Acute Care Choice:    Choice offered to:  Patient, Spouse  DME Arranged:    DME Agency:     HH Arranged:  RN, PT [place] Agency:  [place]  Status of Service:  Completed, signed off  If discussed at [place] of Stay Meetings, dates discussed:    Additional CommentsPurcell Mouton, RN 08/20/2018, 3:14 PM

## 2018-08-20 NOTE — Discharge Instructions (Signed)

## 2018-08-20 NOTE — Discharge Summary (Signed)
Physician Discharge Summary  [name] XHB:[id number] DOB: 01-26-1936  PCP: Tammi Sou, MD  Admit date: 08/14/2018 Discharge date: 08/20/2018  Recommendations for Outpatient Follow-up:  1. Dr. Shawnie Dapper, PCP in 1 week.  Please follow-up labs (CBC, BMP, PT & INR) which are supposed to be drawn by home health RN.  Please adjust diabetic medications for poorly controlled DM 2. 2. Dr. Kirk Ruths, Cardiology in 2 weeks. 3. Dr. Baltazar Apo, Pulmonology: Patient is advised to keep the prior appointment that he has for 09/07/2018 at 9:30 AM. 4. [facility].  After home health RN completes home visits. 5. Consider repeating CT chest in a couple of weeks after completion of antibiotic course to reassess pneumonia findings.  He was supposed to repeat CT chest to evaluate lung mass that was noted on previous CT in August but had CTA chest this admission and probably does not need this repeated but will defer to TCTS during follow-up.  Home Health: PT and RN Equipment/Devices: Rolling walker with seat  Discharge Condition: Improved and stable CODE STATUS: DNR Diet recommendation: Heart healthy & diabetic diet.  Discharge Diagnoses:  Active Problems:   Essential hypertension, benign   CAD (coronary artery disease)   COPD (chronic obstructive pulmonary disease) with emphysema (HCC)   History of mitral valve repair   Atrial fibrillation (HCC)   Acute on chronic respiratory failure with hypoxia (HCC)   Chronic congestive heart failure (HCC)   DM2 (diabetes mellitus, type 2) (HCC)   Acute exacerbation of congestive heart failure (HCC)   HLD (hyperlipidemia)   CKD (chronic kidney disease) stage 3, GFR 30-59 ml/min (HCC)   Leukocytosis   Acute on chronic diastolic CHF (congestive heart failure) (HCC)   Brief Summary: 82 year old male, lives at home with his spouse, independent, usually sleeps on recliner, PMH of PAF on Coumadin, CAD, chronic diastolic CHF,  chronic hypoxic respiratory failure on home oxygen 2-3 L/min continuously, stage III chronic kidney disease, COPD on chronic prednisone, DM 2, HLD, embolic CVA, MVR, HTN, presented to ED with worsening of chronic dyspnea, orthopnea, PND, has chronic leg edema, intermittent productive cough of brown sputum and occasional specks of blood.  Admitted for decompensated CHF and acute on chronic hypoxic respiratory failure.  Overnight 11/27, had episode of exertional chest pain, worsening dyspnea, NSVT versus A. fib with aberrancy on monitor, received additional Lasix 80 mg IV x1, creatinine has further increased from 1.3-1.49.  Cardiology consulted for assistance.  CHF improved.  Ongoing hemoptysis, concern for PE/lung mass (based on previous CTA chest 04/2018) hence repeated CTA chest-no PE but shows areas of bilateral lower lobe consolidation concerning for pneumonia.  Antibiotics initiated.     Assessment & Plan:   Acute on chronic diastolic CHF: TTE 8/10: LVEF 60-65%, severe LVH, post MV repair and PA peak pressure 72 mmHg.  Reports compliance with medications and daily weights.  Unclear etiology.  Treated initially with IV Lasix 60 mg every 12 hours.  TTE 11/28: LVEF 50-55%, unable to evaluate LV diastolic function, RV pressure overload.  Overnight 11/27, received additional IV Lasix 80 mg for dyspnea and chest pain. Creatinine went up from 1.3-1.5.  Clinically euvolemic.  Torsemide 20 mg daily started 11/29, continue.  Cardiology signed off 11/30.  Stable.  Acute on chronic hypoxic respiratory failure: Likely due to decompensated CHF and community-acquired pneumonia complicating underlying COPD and pulmonary hypertension.  Treat CHF as above and wean oxygen to prior levels as tolerated.  Had oxygen saturation of  84% per EMS.  Currently saturating in the low 90s on 3 L/min oxygen.  Concerns regarding PE, lung mass versus other etiologies.  Thereby underwent CTA chest with reduced contrast volume on 11/29  which was negative for PE but shows areas of consolidation concerning for pneumonia.  I reviewed CTA chest with pulmonology on 11/29 and no lung mass noted.  Patient is back on prior home level of oxygen.  Improved and stable.   Lobar pneumonia/community-acquired pneumonia: Patient had symptoms of productive cough prior to admission.  He had mild hemoptysis prior to admission.  CTA chest shows progressive left lower lobe collapse/consolidation and collapse/consolidation in posterior right lower lobe.  Started IV ceftriaxone and oral doxycycline 11/29.  Incentive spirometry.  Speech therapy evaluation noted, on regular diet and thin liquids.  No true hemoptysis- red color likely due to cough drops!.  Discontinued cough drops and monitor sputum.    Completed 3 days of IV ceftriaxone and oral doxycycline in the hospital.  Discharged on additional 4 days of cefdinir and doxycycline to complete total 7 days course.  Consider repeating CT chest in a couple weeks to ensure resolution of pneumonia findings.  Patient has outpatient follow-up with pulmonology made this month.  COPD: No clinical bronchospasm.  Oxygen supplements and as needed bronchodilator nebulizations.  Patient seems to be on high doses of maintenance prednisone 30 mg daily.  Strongly recommend gradual taper as outpatient to avoid side effects including poorly controlled DM 2.  Essential hypertension: Controlled.  Hypokalemia: Replaced  Elevated troponin: Mild and flat trend.  Likely related to demand ischemia from decompensated CHF, respiratory failure and chronic kidney disease.  Due to overnight episode of chest pain, repeated troponin and flat/no change.  Paroxysmal A. Fib: CHA2DS2VAsc 6.  Rate controlled.  Continue digoxin (dose reduced by cardiology), Cardizem.  INR subtherapeutic.  Coumadin & heparin per pharmacy.  However given prior history of embolic strokes, and high embolic risk, IV heparin bridging until INR therapeutic.   Controlled ventricular rate.  INR >2 today.  Discontinued IV heparin bridge on 12/1.  As per spouse and patient, requesting home health RN for lab draws until he gets stronger to be able to go to the Coumadin clinic.  Case management consulted.  NSVT versus A. fib with aberrancy: Noted overnight 11/27.  Continue telemetry.  Attempt to keep potassium greater than 4 and magnesium greater than 2.  Digoxin level 1.3, dose reduced.  CAD: Had angina-like symptoms overnight.  Troponin flat.  Management as above.  Hyperlipidemia: Continue statins.  DM2: A1c 8.8 on 07/24/2018 indicating poor control, likely complicated by chronic prednisone use.  In the hospital was treated with Lantus, NovoLog mealtime and SSI.  Resume prior home dose of Glucotrol at discharge but will need close follow-up with PCP to adjust dose or add another agent.  Also recommend tapering prednisone.  Acute Kidney Injury complicating stage III chronic kidney disease: Recent baseline creatinine 1-1.2.  Creatinine on admission 1.3 which has now increased to 1.5, likely multifactorial due to cardiorenal and aggressive diuresis.  Held Lasix .  Torsemide initiated 11/29.  Creatinine improved and stable.  Close outpatient follow-up of BMP.  History of embolic CVA: Anticoagulation discussion as above.  Hemoptysis, mild: Suspect not true hemoptysis but due to colored cough drops. Now resolved.  Leukocytosis: Suspect due to chronic steroids.  No clinical concern for infection.  Resolved.  Thrombocytopenia: Resolved.   Consultants:  Cardiology  Procedures:  None  Discharge Instructions  Discharge Instructions    (  HEART FAILURE PATIENTS) Call MD:  Anytime you have any of the following symptoms: 1) 3 pound weight gain in 24 hours or 5 pounds in 1 week 2) shortness of breath, with or without a dry hacking cough 3) swelling in the hands, feet or stomach 4) if you have to sleep on extra pillows at night in order to breathe.    Complete by:  As directed    Call MD for:  difficulty breathing, headache or visual disturbances   Complete by:  As directed    Call MD for:  extreme fatigue   Complete by:  As directed    Call MD for:  persistant dizziness or light-headedness   Complete by:  As directed    Call MD for:  temperature >100.4   Complete by:  As directed    Diet - low sodium heart healthy   Complete by:  As directed    Diet Carb Modified   Complete by:  As directed    Increase activity slowly   Complete by:  As directed        Medication List    STOP taking these medications   furosemide 40 MG tablet Commonly known as:  LASIX     TAKE these medications   albuterol (2.5 MG/3ML) 0.083% nebulizer solution Commonly known as:  PROVENTIL Take 3 mLs (2.5 mg total) by nebulization every 4 (four) hours. And as needed What changed:  Another medication with the same name was changed. Make sure you understand how and when to take each.   albuterol 108 (90 Base) MCG/ACT inhaler Commonly known as:  PROVENTIL HFA;VENTOLIN HFA INHALE ONE PUFF BY MOUTH EVERY 6 HOURS AS NEEDED FOR WHEEZING AND FOR SHORTNESS OF BREATH (USING 5-6 TIMES EVERY 24 HOURS) What changed:  See the new instructions.   benzonatate 200 MG capsule Commonly known as:  TESSALON Take 1 capsule (200 mg total) by mouth 3 (three) times daily.   budesonide 0.5 MG/2ML nebulizer solution Commonly known as:  PULMICORT USE 1 VIAL IN NEBULIZER TWICE DAILY What changed:  Another medication with the same name was removed. Continue taking this medication, and follow the directions you see here.   cefdinir 300 MG capsule Commonly known as:  OMNICEF Take 1 capsule (300 mg total) by mouth 2 (two) times daily.   digoxin 0.125 MG tablet Commonly known as:  LANOXIN Take 0.5 tablets (0.0625 mg total) by mouth daily. What changed:  how much to take   diltiazem 240 MG 24 hr capsule Commonly known as:  CARDIZEM CD Take 1 capsule (240 mg total) by mouth  daily.   doxycycline 100 MG tablet Commonly known as:  VIBRA-TABS Take 1 tablet (100 mg total) by mouth 2 (two) times daily.   glipiZIDE 10 MG 24 hr tablet Commonly known as:  GLUCOTROL XL Take 1 tablet (10 mg total) by mouth daily with breakfast.   LORazepam 0.5 MG tablet Commonly known as:  ATIVAN Take 1 tablet by mouth daily as needed for anxiety.   pravastatin 40 MG tablet Commonly known as:  PRAVACHOL TAKE 1 TABLET EVERY EVENING What changed:    how much to take  how to take this  when to take this   predniSONE 20 MG tablet Commonly known as:  DELTASONE Take 1.5 tablets (30 mg total) by mouth daily with breakfast.   STIOLTO RESPIMAT 2.5-2.5 MCG/ACT Aers Generic drug:  Tiotropium Bromide-Olodaterol INHALE 2 PUFFS INTO THE LUNGS DAILY. What changed:  See the new instructions.  torsemide 20 MG tablet Commonly known as:  DEMADEX Take 1 tablet (20 mg total) by mouth daily. Start taking on:  08/21/2018   warfarin 5 MG tablet Commonly known as:  COUMADIN Take as directed. If you are unsure how to take this medication, talk to your nurse or doctor. Original instructions:  Take 1/2 to 1 tablet by mouth daily as directed What changed:    how much to take  how to take this  when to take this  additional instructions      Follow-up Information    McGowen, Adrian Blackwater, MD. Schedule an appointment as soon as possible for a visit in 1 week(s).   Specialty:  Family Medicine Why:  Hospital follow-up.  Please follow labs (CBC, BMP, PT & INR) that are to be drawn by home health RN. Contact information: [address] [place] [address] [phone]        Lelon Perla, MD. Schedule an appointment as soon as possible for a visit in 2 week(s).   Specialty:  Cardiology Contact information: [address] [place] [address] [phone]        Collene Gobble, MD Follow up on 09/07/2018.   Specialty:  Pulmonary Disease Why:  9:30  am. Keep prior appointment. Contact information: [address] [place] [address] [phone]        [facility] [employer]. Schedule an appointment as soon as possible for a visit in 1 week(s).   Specialty:  Cardiology Why:  Please call Coumadin clinic for follow up with labs. Contact information: [address] [place] [address] [phone]         No Known Allergies    Procedures/Studies: Ct Angio Chest Pe W Or Wo Contrast  Result Date: 08/17/2018 CLINICAL DATA:  Shortness of breath.  Concern for pulmonary embolus. EXAM: CT ANGIOGRAPHY CHEST WITH CONTRAST TECHNIQUE: Multidetector CT imaging of the chest was performed using the standard protocol during bolus administration of intravenous contrast. Multiplanar CT image reconstructions and MIPs were obtained to evaluate the vascular anatomy. CONTRAST:  8mL ISOVUE-370 IOPAMIDOL (ISOVUE-370) INJECTION 76% COMPARISON:  05/19/2018 FINDINGS: Cardiovascular: Heart is enlarged. Coronary artery calcification is evident. Atherosclerotic calcification is noted in the wall of the thoracic aorta. No filling defects in the opacified pulmonary arteries to suggest the presence of an acute pulmonary embolus. Evaluation of lower lobe segmental and subsegmental branches is limited by motion artifact from patient breathing. Mediastinum/Nodes: Scattered small lymph nodes are seen in the mediastinum. 10 mm short axis anterior left hilar lymph node is stable. 13 mm short axis subcarinal lymph node is unchanged. 19 mm short axis lymph node inferior right hilum is stable. The esophagus has normal imaging features. There is no axillary lymphadenopathy. Lungs/Pleura: The central tracheobronchial airways are patent. Centrilobular and paraseptal emphysema noted bilaterally. Fine architectural detail of the lungs is obscured by continuous breathing motion during image acquisition. 4 mm right upper lobe  pulmonary nodule seen previously is stable (29/10) interval development of patchy airspace disease in the posterior right lower lobe. The confluent left lower lobe opacity seen previously shows progression medially (image 81/10). No substantial pleural effusion. Upper Abdomen: Stable 2.2 cm left adrenal nodule. Low attenuation in this nodule is compatible with benign adrenal adenoma. Musculoskeletal: No worrisome lytic or sclerotic osseous abnormality. Review of the MIP images confirms the above findings. IMPRESSION: 1. No CT evidence for acute pulmonary embolus. 2. Progressive left  lower lobe collapse/consolidation since prior study. This is associated with new collapse/consolidation in the posterior right lower lobe. 3. Stable mediastinal and right hilar lymphadenopathy. 4.  Emphysema. (ICD10-J43.9) 5.  Aortic Atherosclerois (ICD10-170.0) Electronically Signed   By: Misty Stanley M.D.   On: 08/17/2018 13:13   Dg Chest Portable 1 View  Result Date: 08/14/2018 CLINICAL DATA:  82 y/o M; shortness of breath, hypoxia, history of CABG. Former smoker. EXAM: PORTABLE CHEST 1 VIEW COMPARISON:  05/19/2018 chest radiograph. FINDINGS: Stable cardiomegaly given projection and technique. Post median sternotomy with wires in alignment. Mitral valve annuloplasty. Calcific aortic atherosclerosis. Diffuse reticular opacities of the lungs. Possible small left effusion. No acute osseous abnormality is evident. IMPRESSION: 1. Interstitial pulmonary edema and possible small left effusion. 2. Cardiomegaly.  Aortic Atherosclerosis (ICD10-I70.0). Electronically Signed   By: Kristine Garbe M.D.   On: 08/14/2018 15:46      Subjective: Anxious to go home.  Feels much better.  Denies complaints.  Dyspnea improved and back to baseline.  No chest pain.  No hemoptysis.  Discharge Exam:  Vitals:   08/20/18 0618 08/20/18 0930 08/20/18 0935 08/20/18 1409  BP: 129/79   132/76  Pulse: (!) 49   65  Resp: 18     Temp: 97.6  F (36.4 C)   97.8 F (36.6 C)  TempSrc: Oral   Oral  SpO2: 95% 95% 95% 99%  Weight:      Height:        General exam: Elderly male, moderately built and nourished, lying comfortably propped up in bed.   Respiratory system: Occasional basal crackles.  Distant breath sounds otherwise but clear to auscultation.  No increased work of breathing.   Cardiovascular system: S1 and S2 heard, irregularly irregular.  No JVD, murmurs, pedal or presacral edema.  Telemetry personally reviewed: A. fib with controlled ventricular rate, BBB morphology. Gastrointestinal system: Abdomen is nondistended, soft and nontender. No organomegaly or masses felt. Normal bowel sounds heard.   Central nervous system: Alert and oriented. No focal neurological deficits.  Extremities: Symmetric 5 x 5 power. Skin: No rashes, lesions or ulcers Psychiatry: Judgement and insight appears impaired. Mood & affect appropriate.     The results of significant diagnostics from this hospitalization (including imaging, microbiology, ancillary and laboratory) are listed below for reference.     Labs: CBC: Recent Labs  Lab 08/14/18 1714 08/15/18 3536 08/16/18 0217 08/17/18 0449 08/18/18 0514 08/19/18 0421  WBC 14.0* 13.8* 10.6* 10.5 10.9* 10.3  NEUTROABS 11.4*  --   --   --   --   --   HGB 13.5 13.4 12.6* 12.1* 11.7* 11.2*  HCT 44.1 46.1 39.5 40.2 38.4* 36.3*  MCV 88.7 92.0 86.2 88.9 88.7 85.2  PLT 158 149* 129* 154 148* 144   Basic Metabolic Panel: Recent Labs  Lab 08/16/18 0217 08/16/18 1449 08/17/18 0449 08/18/18 0514 08/19/18 0421 08/20/18 0449  NA 136  --  137 136 134* 136  K 3.5  --  4.3 4.3 3.9 3.9  CL 97*  --  101 98 97* 100  CO2 29  --  25 26 27 27   GLUCOSE 199*  --  157* 94 138* 111*  BUN 27*  --  29* 23 21 22   CREATININE 1.49*  --  1.42* 1.28* 1.17 1.26*  CALCIUM 8.9  --  9.3 9.0 9.1 9.3  MG  --  2.2  --   --   --  2.1    BNP (last 3 results)  Recent Labs    03/04/18 1803 05/19/18 1700  08/14/18 1715  BNP 144.2* 113.2* 98.8   Cardiac Enzymes: Recent Labs  Lab 08/15/18 0613 08/15/18 1310 08/16/18 1006 08/16/18 1449 08/16/18 1944  TROPONINI 0.10* 0.09* 0.09* 0.08* 0.07*   CBG: Recent Labs  Lab 08/19/18 1158 08/19/18 1613 08/19/18 2107 08/20/18 0743 08/20/18 1141  GLUCAP 194* 242* 228* 94 181*   Thyroid function studies Recent Labs    08/18/18 0514  TSH 2.438   Urinalysis    Component Value Date/Time   COLORURINE YELLOW 08/14/2018 2350   APPEARANCEUR CLEAR 08/14/2018 2350   LABSPEC 1.012 08/14/2018 2350   PHURINE 6.0 08/14/2018 2350   GLUCOSEU >=500 (A) 08/14/2018 2350   HGBUR SMALL (A) 08/14/2018 2350   BILIRUBINUR NEGATIVE 08/14/2018 2350   BILIRUBINUR small 11/27/2014 1025   KETONESUR NEGATIVE 08/14/2018 2350   PROTEINUR NEGATIVE 08/14/2018 2350   UROBILINOGEN 4.0 11/27/2014 1025   UROBILINOGEN 0.2 11/06/2014 0851   NITRITE NEGATIVE 08/14/2018 2350   LEUKOCYTESUR NEGATIVE 08/14/2018 2350    I discussed in detail with patient spouse at bedside.  Updated care and answer questions.  Time coordinating discharge: 50 minutes  SIGNED:  Vernell Leep, MD, FACP, [facility]. [facility] Pager [phone]  If 7PM-7AM, please contact night-coverage [email or web address] Password [facility] 08/20/2018, 3:04 PM

## 2018-08-20 NOTE — Care Management Important Message (Signed)
Important Message  Patient Details  Name: [name] MRN: [id number] Date of Birth: 09-18-1936   Medicare Important Message Given:  Yes    Kerin Salen 08/20/2018, 11:05 AMImportant Message  Patient Details  Name: [name] MRN: [id number] Date of Birth: Oct 14, 1935   Medicare Important Message Given:  Yes    Kerin Salen 08/20/2018, 11:05 AM

## 2018-08-21 ENCOUNTER — Telehealth: Payer: Self-pay

## 2018-08-21 ENCOUNTER — Encounter: Payer: Self-pay | Admitting: Family Medicine

## 2018-08-21 DIAGNOSIS — Z87891 Personal history of nicotine dependence: Secondary | ICD-10-CM | POA: Diagnosis not present

## 2018-08-21 DIAGNOSIS — Z8673 Personal history of transient ischemic attack (TIA), and cerebral infarction without residual deficits: Secondary | ICD-10-CM | POA: Diagnosis not present

## 2018-08-21 DIAGNOSIS — J9621 Acute and chronic respiratory failure with hypoxia: Secondary | ICD-10-CM | POA: Diagnosis not present

## 2018-08-21 DIAGNOSIS — J181 Lobar pneumonia, unspecified organism: Secondary | ICD-10-CM | POA: Diagnosis not present

## 2018-08-21 DIAGNOSIS — I480 Paroxysmal atrial fibrillation: Secondary | ICD-10-CM | POA: Diagnosis not present

## 2018-08-21 DIAGNOSIS — Z952 Presence of prosthetic heart valve: Secondary | ICD-10-CM | POA: Diagnosis not present

## 2018-08-21 DIAGNOSIS — Z5181 Encounter for therapeutic drug level monitoring: Secondary | ICD-10-CM | POA: Diagnosis not present

## 2018-08-21 DIAGNOSIS — J439 Emphysema, unspecified: Secondary | ICD-10-CM | POA: Diagnosis not present

## 2018-08-21 DIAGNOSIS — I5033 Acute on chronic diastolic (congestive) heart failure: Secondary | ICD-10-CM | POA: Diagnosis not present

## 2018-08-21 DIAGNOSIS — E1122 Type 2 diabetes mellitus with diabetic chronic kidney disease: Secondary | ICD-10-CM | POA: Diagnosis not present

## 2018-08-21 DIAGNOSIS — Z7952 Long term (current) use of systemic steroids: Secondary | ICD-10-CM | POA: Diagnosis not present

## 2018-08-21 DIAGNOSIS — I272 Pulmonary hypertension, unspecified: Secondary | ICD-10-CM | POA: Diagnosis not present

## 2018-08-21 DIAGNOSIS — I2510 Atherosclerotic heart disease of native coronary artery without angina pectoris: Secondary | ICD-10-CM | POA: Diagnosis not present

## 2018-08-21 DIAGNOSIS — Z7901 Long term (current) use of anticoagulants: Secondary | ICD-10-CM | POA: Diagnosis not present

## 2018-08-21 DIAGNOSIS — Z9981 Dependence on supplemental oxygen: Secondary | ICD-10-CM | POA: Diagnosis not present

## 2018-08-21 DIAGNOSIS — Z79899 Other long term (current) drug therapy: Secondary | ICD-10-CM | POA: Diagnosis not present

## 2018-08-21 DIAGNOSIS — Z8546 Personal history of malignant neoplasm of prostate: Secondary | ICD-10-CM | POA: Diagnosis not present

## 2018-08-21 DIAGNOSIS — N183 Chronic kidney disease, stage 3 (moderate): Secondary | ICD-10-CM | POA: Diagnosis not present

## 2018-08-21 DIAGNOSIS — I130 Hypertensive heart and chronic kidney disease with heart failure and stage 1 through stage 4 chronic kidney disease, or unspecified chronic kidney disease: Secondary | ICD-10-CM | POA: Diagnosis not present

## 2018-08-21 NOTE — Telephone Encounter (Signed)
Spoke with patients wife, Jeanett Schlein.   Transition Care Management Follow-up Telephone Call  Admit date: 08/14/2018 Discharge date: 08/20/2018 Dx: Acute on chronic diastolic CHF   How have you been since you were released from the hospital? "He's still coughing, but a little better"   Do you understand why you were in the hospital? yes, "he got the pneumonia"   Do you understand the discharge instructions? yes   Where were you discharged to? Home. Lives with wife. HH PT and RN ordered.    Items Reviewed:  Medications reviewed: no, Advised to bring meds to appt   Allergies reviewed: no  Dietary changes reviewed: yes. Heart Healthy diet discussed.   Referrals reviewed: yes   Functional Questionnaire:   Activities of Daily Living (ADLs):   He states they are independent in the following: Wife states pt requires assistance with ADLs States they require assistance with the following: All currently   Any transportation issues/concerns?: no   Any patient concerns? no   Confirmed importance and date/time of follow-up visits scheduled yes  Provider Appointment booked with PCP 08/27/18 @ 11. Wife to cancel CT scheduled on this day.   Confirmed with patient if condition begins to worsen call PCP or go to the ER.  Patient was given the office number and encouraged to call back with question or concerns.  : yes

## 2018-08-22 DIAGNOSIS — I5033 Acute on chronic diastolic (congestive) heart failure: Secondary | ICD-10-CM | POA: Diagnosis not present

## 2018-08-22 DIAGNOSIS — J181 Lobar pneumonia, unspecified organism: Secondary | ICD-10-CM | POA: Diagnosis not present

## 2018-08-22 DIAGNOSIS — J439 Emphysema, unspecified: Secondary | ICD-10-CM | POA: Diagnosis not present

## 2018-08-22 DIAGNOSIS — I130 Hypertensive heart and chronic kidney disease with heart failure and stage 1 through stage 4 chronic kidney disease, or unspecified chronic kidney disease: Secondary | ICD-10-CM | POA: Diagnosis not present

## 2018-08-22 DIAGNOSIS — I2510 Atherosclerotic heart disease of native coronary artery without angina pectoris: Secondary | ICD-10-CM | POA: Diagnosis not present

## 2018-08-22 DIAGNOSIS — E1122 Type 2 diabetes mellitus with diabetic chronic kidney disease: Secondary | ICD-10-CM | POA: Diagnosis not present

## 2018-08-23 ENCOUNTER — Telehealth: Payer: Self-pay | Admitting: Family Medicine

## 2018-08-23 ENCOUNTER — Other Ambulatory Visit: Payer: Self-pay | Admitting: *Deleted

## 2018-08-23 NOTE — Telephone Encounter (Signed)
Yes, fine

## 2018-08-23 NOTE — Patient Outreach (Signed)
[facility]) Care Management  08/23/2018  KELSEY EDMAN [birth year] [id number]   EMMI- Heart failure,   RED ON EMMI ALERT Day # 2 Date: 08/22/18 Red Alert Reason:   Insurance: medicare  [facility] admissions x 2  ED visits x 2 in the last 6 months    Outreach attempt # 1 No answer. [facility] RN CM left HIPAA compliant voicemail message along with CM's contact info.   Plan: [facility] RN CM sent an unsuccessful outreach letter and scheduled this patient for another call attempt within 4 business days  Kimberly L. Lavina Hamman, RN, BSN, [place] Coordinator Office number [phone] Mobile number [phone]  Main [facility] number [phone] Fax number [phone]

## 2018-08-23 NOTE — Telephone Encounter (Signed)
Please advise. Thanks.  

## 2018-08-23 NOTE — Telephone Encounter (Signed)
Left detailed message on Jim's cell vm.

## 2018-08-23 NOTE — Telephone Encounter (Signed)
Copied from [place] [phone]. Topic: Quick Communication - Home Health Verbal Orders >> Aug 23, 2018  9:51 AM Berneta Levins wrote: Caller/Agency: Clair Gulling with [facility] Number: [phone], OK to leave a message Requesting OT/PT/Skilled Nursing/Social Work: PT Frequency: 2x a week for 4 weeks, starting next week, for functional mobility and fall risk reduction

## 2018-08-24 ENCOUNTER — Ambulatory Visit (INDEPENDENT_AMBULATORY_CARE_PROVIDER_SITE_OTHER): Payer: Medicare Other | Admitting: Pharmacist

## 2018-08-24 DIAGNOSIS — Z5181 Encounter for therapeutic drug level monitoring: Secondary | ICD-10-CM | POA: Diagnosis not present

## 2018-08-24 DIAGNOSIS — J439 Emphysema, unspecified: Secondary | ICD-10-CM | POA: Diagnosis not present

## 2018-08-24 DIAGNOSIS — J181 Lobar pneumonia, unspecified organism: Secondary | ICD-10-CM | POA: Diagnosis not present

## 2018-08-24 DIAGNOSIS — I480 Paroxysmal atrial fibrillation: Secondary | ICD-10-CM | POA: Diagnosis not present

## 2018-08-24 DIAGNOSIS — I5033 Acute on chronic diastolic (congestive) heart failure: Secondary | ICD-10-CM | POA: Diagnosis not present

## 2018-08-24 DIAGNOSIS — E1122 Type 2 diabetes mellitus with diabetic chronic kidney disease: Secondary | ICD-10-CM | POA: Diagnosis not present

## 2018-08-24 DIAGNOSIS — I2510 Atherosclerotic heart disease of native coronary artery without angina pectoris: Secondary | ICD-10-CM | POA: Diagnosis not present

## 2018-08-24 DIAGNOSIS — I130 Hypertensive heart and chronic kidney disease with heart failure and stage 1 through stage 4 chronic kidney disease, or unspecified chronic kidney disease: Secondary | ICD-10-CM | POA: Diagnosis not present

## 2018-08-24 LAB — POCT INR: INR: 3 (ref 2.0–3.0)

## 2018-08-27 ENCOUNTER — Ambulatory Visit (HOSPITAL_BASED_OUTPATIENT_CLINIC_OR_DEPARTMENT_OTHER): Payer: Medicare Other

## 2018-08-27 ENCOUNTER — Encounter: Payer: Self-pay | Admitting: Family Medicine

## 2018-08-27 ENCOUNTER — Ambulatory Visit (INDEPENDENT_AMBULATORY_CARE_PROVIDER_SITE_OTHER): Payer: Medicare Other | Admitting: Family Medicine

## 2018-08-27 ENCOUNTER — Telehealth: Payer: Self-pay

## 2018-08-27 ENCOUNTER — Other Ambulatory Visit: Payer: Self-pay | Admitting: *Deleted

## 2018-08-27 VITALS — BP 125/73 | HR 111 | Temp 97.5°F | Resp 24 | Wt 178.6 lb

## 2018-08-27 DIAGNOSIS — I480 Paroxysmal atrial fibrillation: Secondary | ICD-10-CM | POA: Diagnosis not present

## 2018-08-27 DIAGNOSIS — E118 Type 2 diabetes mellitus with unspecified complications: Secondary | ICD-10-CM

## 2018-08-27 DIAGNOSIS — J189 Pneumonia, unspecified organism: Secondary | ICD-10-CM

## 2018-08-27 DIAGNOSIS — I509 Heart failure, unspecified: Secondary | ICD-10-CM

## 2018-08-27 DIAGNOSIS — R079 Chest pain, unspecified: Secondary | ICD-10-CM

## 2018-08-27 DIAGNOSIS — N179 Acute kidney failure, unspecified: Secondary | ICD-10-CM | POA: Diagnosis not present

## 2018-08-27 DIAGNOSIS — J9611 Chronic respiratory failure with hypoxia: Secondary | ICD-10-CM | POA: Diagnosis not present

## 2018-08-27 DIAGNOSIS — D72829 Elevated white blood cell count, unspecified: Secondary | ICD-10-CM | POA: Diagnosis not present

## 2018-08-27 LAB — CBC WITH DIFFERENTIAL/PLATELET
Basophils Absolute: 0 10*3/uL (ref 0.0–0.1)
Basophils Relative: 0.3 % (ref 0.0–3.0)
EOS ABS: 0 10*3/uL (ref 0.0–0.7)
Eosinophils Relative: 0.2 % (ref 0.0–5.0)
HCT: 40.8 % (ref 39.0–52.0)
Hemoglobin: 13.2 g/dL (ref 13.0–17.0)
Lymphocytes Relative: 16.3 % (ref 12.0–46.0)
Lymphs Abs: 2.2 10*3/uL (ref 0.7–4.0)
MCHC: 32.3 g/dL (ref 30.0–36.0)
MCV: 82.5 fl (ref 78.0–100.0)
MONO ABS: 0.7 10*3/uL (ref 0.1–1.0)
Monocytes Relative: 5.1 % (ref 3.0–12.0)
Neutro Abs: 10.4 10*3/uL — ABNORMAL HIGH (ref 1.4–7.7)
Neutrophils Relative %: 78.1 % — ABNORMAL HIGH (ref 43.0–77.0)
Platelets: 252 10*3/uL (ref 150.0–400.0)
RBC: 4.94 Mil/uL (ref 4.22–5.81)
RDW: 17.5 % — ABNORMAL HIGH (ref 11.5–15.5)
WBC: 13.3 10*3/uL — ABNORMAL HIGH (ref 4.0–10.5)

## 2018-08-27 LAB — BASIC METABOLIC PANEL
BUN: 25 mg/dL — ABNORMAL HIGH (ref 6–23)
CHLORIDE: 100 meq/L (ref 96–112)
CO2: 31 mEq/L (ref 19–32)
Calcium: 9.7 mg/dL (ref 8.4–10.5)
Creatinine, Ser: 1.23 mg/dL (ref 0.40–1.50)
GFR: 60.07 mL/min (ref >60.00)
Glucose, Bld: 143 mg/dL — ABNORMAL HIGH (ref 70–99)
Potassium: 3.7 mEq/L (ref 3.5–5.1)
SODIUM: 140 meq/L (ref 135–145)

## 2018-08-27 LAB — PROTIME-INR
INR: 3.7 ratio — ABNORMAL HIGH (ref 0.8–1.0)
Prothrombin Time: 41.8 s — ABNORMAL HIGH (ref 9.6–13.1)

## 2018-08-27 MED ORDER — PANTOPRAZOLE SODIUM 40 MG PO TBEC: 40.0000 mg | DELAYED_RELEASE_TABLET | Freq: Every day | ORAL | 3 refills | Status: AC

## 2018-08-27 NOTE — Progress Notes (Signed)
08/27/2018  CC:  Chief Complaint  Patient presents with  . Hospitalization Follow-up    TCM    Patient is a 82 y.o. Caucasian male who presents for  hospital follow up, specifically [facility] face-to-face visit. Dates hospitalized: 11/26-12/2, 2019. Days since d/c from hospital: 7 Patient was discharged from hospital to home. Reason for admission to hospital: acute-on-chronic hypoxic resp failure. Found to have pneumonia. Date of interactive (phone) contact with patient and/or caregiver:08/21/18.  I have reviewed patient's discharge summary plus pertinent specific notes, labs, and imaging from the hospitalization.   Admitted for decompensated CHF and acute on chronic hypoxic resp failure.  He had an episode of exertional CP in hosp, and on monitor he had NSVT vs a-fib with aberrancy.  He received an additional 80mg  IV lasix at that time.  CHF improved. He had ongoing hemoptysis worrisome for possible PE/lung mass.  CT chest angio done and this showed no PE but showed areas of bilateral lower lobe consolidation concerning for pneumonia, so antibiotics were initiated. No lung mass was noted.  Hemoptysis later believed to be from red cough drops.   Glucoses 95-195 in hosp, likely up due to chronic steroid need. Cr 1.49 on admission, came down to 1.26 on day of d/c.  BNP 98 on admission. TnI max 0.1, then trended down to 0.07 over the next 36 hours. TSH normal. UA normal except >500 glucose.  Since going home:  Pt has been at baseline level of DOE (basically any walking at all=DOE), requiring his usual 3L oxygen and sats consistently low 90s.  When he walks he typically desats to low 80s, he rests about 3-4 min's and feels back to baseline breathing.  His sat recovers within 1 min, though.  No fevers.  Eating fine.  Wt is up about 2 lbs.  His lasix was d/c'd in hosp and he was put on torsemide 20mg  qd.  Has been having some NONEXERTIONAL CP in substernal area, says it feels  like heartburn, gets better with use of rolaids or pepto bismol.  Burping alleviates it some as well.  No additional SOB, no jaw or arm pain, no diaphoresis or nausea.  No palpitations.   Today, he walked into clinic from car, then walked back to the exam room (with oxygen)-->did not use his wheelchair as he normally does.  When he got to the exam room his sat was 80% and he was signif SOB and complaining of substernal CP.  Said it felt like his cp he had been having at home (see above). No pallor or cyanosis, no diaphoresis, no jaw or arm pain, no nausea.  The cp waxed and waned over the first 20 min of the o/v, then completely resolved.  No med was given while pt was in the clinic.  Medication reconciliation was done today and patient is taking meds as recommended by discharging hospitalist/specialist.    PMH:  Past Medical History:  Diagnosis Date  . Adrenal adenoma   . Atrial fibrillation (HCC)    Rate control + Coumadin  . CAD (coronary artery disease)    Lexiscan Myoview (01/2014): Lateral soft tissue attenuation, no ischemia, not gated, low risk  . Cancer ([place])   . CAP (community acquired pneumonia)    Hospitalized 10/2014  . Chronic diastolic heart failure ([place])   . Chronic hypoxemic respiratory failure (HCC)    from COPD  . Chronic renal insufficiency, stage III (moderate) (HCC)    CrCl 50s  . COPD (chronic  obstructive pulmonary disease) (HCC)    oxygen-dependent (2.5 L 24/7).  Improved on starting chronic prednisone 06/2017 (increased to 20 mg qd 08/2017)  Progressive worsening dyspnea through 2019: 3 L oxygen, 30mg  prednisone qd chronically as of 07/2018.  . Diabetes mellitus without complication ([place]) 73/41/9379   A1c 6.5 % 05/2016  . Diverticulosis   . Dyslipidemia   . Embolic stroke ([place])    d/t endocarditis 2008  . Hemorrhoids   . History of mitral valve replacement    needs SBE prophylaxis  . History of prostate cancer remote past   f/u by Dr. Claris Che; no recurrence as  of f/u 10/2015.  PSA rising from 2013 to 04/2018 (PSA 04/25/18=18.49): urol to rpt PSA and check CT and bone scan  approx 10/2018.  Marland Kitchen HTN (hypertension)   . Left lower lobe pulmonary nodule July/Aug 2019.   04/2018: enlarging irregular nodular opacity.  CT surgery-->PET scan: inflamm/post-infect vs malignant. Plan f/u CT chest 3 mo per CT surg.  . Mediastinal lymphadenopathy   . Pulmonary hypertension ([place]) 01/09/2015   secondary  . Right thyroid nodule   . Thrombocytopenia ([place])   . TIA (transient ischemic attack) 03/25/2011   ? of  (transient diplopia)  . Ulcerative colitis    left-sided/segmental, associated with diverticulosis.  Sulfasalazine per GI (Dr. Carlean Purl).  . Warthin's tumor     PSH:  Past Surgical History:  Procedure Laterality Date  . carotid duplex dopplers  04/2014   No signif plaques; repeat 2 yrs per cardiology  . CATARACT EXTRACTION, BILATERAL    . COLONOSCOPY W/ BIOPSIES  12/19/2007   left-sided colitis, diverticulosis, hemorrhoids  . FLEXIBLE SIGMOIDOSCOPY  01/11/2008; 03/19/2010   2009 and 2011:segmental left colitis, diverticulosis, hemorrhoids  . INGUINAL HERNIA REPAIR     right  . MITRAL VALVE REPAIR  09/22/04   and modified Cox-Maze IV   . MITRAL VALVE REPAIR  09/14/05   redo MV repair d/t endocardiits/embolic events  . PROSTATECTOMY  06/2003   Radical  . RIGHT HEART CATHETERIZATION N/A 01/01/2015   Procedure: RIGHT HEART CATH;  Surgeon: Peter M Martinique, MD;  Location: [facility] CATH LAB;  Service: Cardiovascular;  Laterality: N/A;  . SALIVARY GLAND SURGERY     left resection  . TRANSTHORACIC ECHOCARDIOGRAM  11/2013; 07/2017   2015: Mod LVH, EF 55-60%, no WM abnormalities, +LA dilation, +severe RV dilation and impaired systolic fxn, +increased pulm art pressures.  Valves ok.  2018- EF 60-65%, wall motion nl, s/p MV repair, mild RV syst dysf.     MEDS:  Outpatient Medications Prior to Visit  Medication Sig Dispense Refill  . albuterol (PROVENTIL HFA;VENTOLIN HFA) 108 (90  Base) MCG/ACT inhaler INHALE ONE PUFF BY MOUTH EVERY 6 HOURS AS NEEDED FOR WHEEZING AND FOR SHORTNESS OF BREATH (USING 5-6 TIMES EVERY 24 HOURS) (Patient taking differently: Inhale 1 puff into the lungs every 6 (six) hours as needed for wheezing or shortness of breath. ) 1 Inhaler 5  . albuterol (PROVENTIL) (2.5 MG/3ML) 0.083% nebulizer solution Take 3 mLs (2.5 mg total) by nebulization every 4 (four) hours. And as needed 300 mL 5  . benzonatate (TESSALON) 200 MG capsule Take 1 capsule (200 mg total) by mouth 3 (three) times daily. 20 capsule 0  . budesonide (PULMICORT) 0.5 MG/2ML nebulizer solution USE 1 VIAL IN NEBULIZER TWICE DAILY 120 mL 5  . cefdinir (OMNICEF) 300 MG capsule Take 1 capsule (300 mg total) by mouth 2 (two) times daily. 8 capsule 0  . digoxin (LANOXIN)  0.125 MG tablet Take 0.5 tablets (0.0625 mg total) by mouth daily. 30 tablet 0  . diltiazem (CARDIZEM CD) 240 MG 24 hr capsule Take 1 capsule (240 mg total) by mouth daily. 90 capsule 3  . doxycycline (VIBRA-TABS) 100 MG tablet Take 1 tablet (100 mg total) by mouth 2 (two) times daily. 7 tablet 0  . glipiZIDE (GLUCOTROL XL) 10 MG 24 hr tablet Take 1 tablet (10 mg total) by mouth daily with breakfast. 90 tablet 1  . LORazepam (ATIVAN) 0.5 MG tablet Take 1 tablet by mouth daily as needed for anxiety.     . pravastatin (PRAVACHOL) 40 MG tablet TAKE 1 TABLET EVERY EVENING (Patient taking differently: Take 40 mg by mouth daily. TAKE 1 TABLET EVERY EVENING) 90 tablet 3  . predniSONE (DELTASONE) 20 MG tablet Take 1.5 tablets (30 mg total) by mouth daily with breakfast. 135 tablet 1  . STIOLTO RESPIMAT 2.5-2.5 MCG/ACT AERS INHALE 2 PUFFS INTO THE LUNGS DAILY. (Patient taking differently: Inhale 2 puffs into the lungs daily. ) 12 g 3  . torsemide (DEMADEX) 20 MG tablet Take 1 tablet (20 mg total) by mouth daily. 30 tablet 0  . warfarin (COUMADIN) 5 MG tablet Take 1/2 to 1 tablet by mouth daily as directed (Patient taking differently: Take 2.5-5  mg by mouth as directed. Take 1/2 tablet (2.5 mg) on MWF & Take 1 tablet (5 mg) all other days.) 90 tablet 1   No facility-administered medications prior to visit.     Pertinent labs/imaging See HPI above.  12 lead ekg today: A fib, rate 118, incomplete RBBB, anteroseptal infarct (age undetermined).  No signif change compared to EKG 08/16/18.  ASSESSMENT/PLAN:  1) Acute on chronic hypoxic RF:  Acute on chronic diastolic HF.  Echo w/out significant change compared to 2018. Additionally, had significant bilateral pneumonia. He seems to be nearly back to baseline. Finish all abx. Continue 3L oxygen, goal sats >90%. Continue torsemide 20mg  qd. COPD w/out acute exac: continue pred 30 qd, pulmicort nebs, stiolto, albut prn, tessalon for cough. Has f/u with pulm 09/07/18.  Question of need to do f/u CT chest to f/u tx of his pneumonia. Cardiology f/u set as well.  2) AKI on CRI III: this improved in hosp. Recheck BMET today.  He got reduced dose contrast for his CT angio chest.  3) Elevated TnI: demand ischemia from decomp CHF, resp failure, and CRI.  Mild, flat trend.  No elevation at the time of acute CP in hosp.    4) PAF, coumadin level subtherapeutic: INR back up to >2 at d/c.   Continue dig (dose reduced due to level elevated in hosp) and diltiazem. F/u PT/INR today.  5) CAD/chest pain: TnI mildly elevated, flat trend in hosp. CP here today certainly could be [place] vs ischemia due to increased demand assoc with chronic HF and repeated desats with ambulation.  Also could certainly be GERD/esophageal pain. He states very clearly today that he does NOT want to go to ED for evaluation of today's chest pain episode that occurred while in the office.  EKG was unchanged today in office. Start trial of pantoprazole 40mg  qd.  6) DM 2: poor control, suspect due to chronic prednisone therapy for his severe COPD. No change today in mgmt. Will wait until I see him again in 1 wk, as he is still  in pretty brittle medical state at this time and I don't want to upset things at this time.  7) Leukocytosis and thrombocytopenia:  platelets improved prior to d/c. WBC likely up from pneumonia plus chronic prednisone. Recheck CBC today.  8) Hemoptysis: suspect this was from red colored cough drops after all. CT angio chest w/out any mass found.  Medical decision making of high complexity was utilized today.  An After Visit Summary was printed and given to the patient.  FOLLOW UP:  1 wk  Signed:  Crissie Sickles, MD           08/27/2018

## 2018-08-27 NOTE — Patient Outreach (Signed)
[facility]) Care Management  08/27/2018  KAJ VASIL 09/01/1936 [id number]    EMMI- correction general discharge RED ON EMMI ALERT Day # 1 Date: 08/22/18 1048 Red Alert Reason: Know who to call about changes in condition? No  And Day #4 Saturday 08/25/18  1012  Red Alert Reason-  Lost interest in things? Yes Sad/hopeless/anxious/empty? Yes  Insurance: medicare  [facility] admissions x 2  ED visits x 2 in the last 6 months    Outreach attempt # 2 No answer. [facility] RN CM left HIPAA compliant voicemail message along with CM's contact info.   Plan: [facility] RN CM scheduled this patient for another call attempt within 4 business days  Jabori Henegar L. Lavina Hamman, RN, BSN, [place] Coordinator Office number [phone] Mobile number [phone]  Main [facility] number [phone] Fax number [phone]

## 2018-08-27 NOTE — Telephone Encounter (Signed)
Returned call to Morey Hummingbird - ok to check on Friday.

## 2018-08-27 NOTE — Telephone Encounter (Signed)
rn called with [facility] and states will recheck inr on friday

## 2018-08-28 ENCOUNTER — Telehealth: Payer: Self-pay | Admitting: *Deleted

## 2018-08-28 NOTE — Telephone Encounter (Signed)
Opened in error

## 2018-08-29 ENCOUNTER — Other Ambulatory Visit: Payer: Self-pay | Admitting: *Deleted

## 2018-08-29 DIAGNOSIS — J439 Emphysema, unspecified: Secondary | ICD-10-CM | POA: Diagnosis not present

## 2018-08-29 DIAGNOSIS — J181 Lobar pneumonia, unspecified organism: Secondary | ICD-10-CM | POA: Diagnosis not present

## 2018-08-29 DIAGNOSIS — E1122 Type 2 diabetes mellitus with diabetic chronic kidney disease: Secondary | ICD-10-CM | POA: Diagnosis not present

## 2018-08-29 DIAGNOSIS — I5033 Acute on chronic diastolic (congestive) heart failure: Secondary | ICD-10-CM | POA: Diagnosis not present

## 2018-08-29 DIAGNOSIS — I130 Hypertensive heart and chronic kidney disease with heart failure and stage 1 through stage 4 chronic kidney disease, or unspecified chronic kidney disease: Secondary | ICD-10-CM | POA: Diagnosis not present

## 2018-08-29 DIAGNOSIS — I2510 Atherosclerotic heart disease of native coronary artery without angina pectoris: Secondary | ICD-10-CM | POA: Diagnosis not present

## 2018-08-29 NOTE — Patient Outreach (Signed)
[facility]) Care Management  08/29/2018  STAFFORD RIVIERA 1935/11/20 [id number]   EMMI- correction general discharge RED ON EMMI ALERT Day #1 Date:08/22/18 [place] Reason: Know who to call about changes in condition? No  And Day #4 Saturday 08/25/18  1012  Red Alert Reason-  Lost interest in things? Yes Sad/hopeless/anxious/empty? Yes  Insurance:medicare [facility] 2[facility] visits x 2in the last 6 months   Outreach attempt # 3 Successful to home number  Patient is able to verify HIPAA Reviewed and addressed EMMI red alert/referral to [facility] with patient Mr Nodal states he is doing "alright. I don't think I need anything right now. I'm okay." He denies any changes in his condition. He reports "I have been doing bette since I got home."  He denies a lost of interest in things He reports with his age there are times he does not want to do things  Social: Mr Potts lives with his wife, Jeanett Schlein and needs independent/assist in care He reports no issues with transportation to medical appointments Has advance home care services   Conditions: PNA, HTN, atrial fibrillation, CHF, CAD, endocarditis, CVD, pulmonary HTN, COPD, DM type 2, thyroid nodule, AKI, CKD, hx of prostate cancer HDL  Medications: denies concerns with taking medications as prescribed, affording medications, side effects of medications and questions about medications  Appointments: seen by MD on 08/27/18  Advance Directives: Denies need for assist with advance directives  He has a POA and living will    Consent: [facility] RN CM reviewed [facility] services with patient. Patient gave verbal consent for services  Plan [facility] RN CM will close case at this time as patient has been assessed and no needs identified.   Pt encouraged to return a call to [facility] RN CM prn   . Kimberly L. Lavina Hamman, RN, BSN, [place] Coordinator Office number [phone] Mobile number [phone]  Main [facility] number [phone]

## 2018-08-30 ENCOUNTER — Ambulatory Visit: Payer: Medicare Other | Admitting: Cardiothoracic Surgery

## 2018-08-31 ENCOUNTER — Ambulatory Visit (INDEPENDENT_AMBULATORY_CARE_PROVIDER_SITE_OTHER): Payer: Medicare Other | Admitting: Cardiology

## 2018-08-31 DIAGNOSIS — J439 Emphysema, unspecified: Secondary | ICD-10-CM | POA: Diagnosis not present

## 2018-08-31 DIAGNOSIS — I2510 Atherosclerotic heart disease of native coronary artery without angina pectoris: Secondary | ICD-10-CM | POA: Diagnosis not present

## 2018-08-31 DIAGNOSIS — J181 Lobar pneumonia, unspecified organism: Secondary | ICD-10-CM | POA: Diagnosis not present

## 2018-08-31 DIAGNOSIS — E1122 Type 2 diabetes mellitus with diabetic chronic kidney disease: Secondary | ICD-10-CM | POA: Diagnosis not present

## 2018-08-31 DIAGNOSIS — I130 Hypertensive heart and chronic kidney disease with heart failure and stage 1 through stage 4 chronic kidney disease, or unspecified chronic kidney disease: Secondary | ICD-10-CM | POA: Diagnosis not present

## 2018-08-31 DIAGNOSIS — I480 Paroxysmal atrial fibrillation: Secondary | ICD-10-CM

## 2018-08-31 DIAGNOSIS — Z5181 Encounter for therapeutic drug level monitoring: Secondary | ICD-10-CM | POA: Diagnosis not present

## 2018-08-31 DIAGNOSIS — I5033 Acute on chronic diastolic (congestive) heart failure: Secondary | ICD-10-CM | POA: Diagnosis not present

## 2018-08-31 LAB — POCT INR: INR: 2.9 (ref 2.0–3.0)

## 2018-09-03 ENCOUNTER — Encounter: Payer: Self-pay | Admitting: Family Medicine

## 2018-09-03 ENCOUNTER — Ambulatory Visit (INDEPENDENT_AMBULATORY_CARE_PROVIDER_SITE_OTHER): Payer: Medicare Other | Admitting: Family Medicine

## 2018-09-03 VITALS — BP 126/69 | HR 104 | Temp 98.2°F | Resp 16 | Ht 68.0 in | Wt 183.0 lb

## 2018-09-03 DIAGNOSIS — Z7952 Long term (current) use of systemic steroids: Secondary | ICD-10-CM

## 2018-09-03 DIAGNOSIS — K228 Other specified diseases of esophagus: Secondary | ICD-10-CM

## 2018-09-03 DIAGNOSIS — I4891 Unspecified atrial fibrillation: Secondary | ICD-10-CM | POA: Diagnosis not present

## 2018-09-03 DIAGNOSIS — R911 Solitary pulmonary nodule: Secondary | ICD-10-CM

## 2018-09-03 DIAGNOSIS — Z5181 Encounter for therapeutic drug level monitoring: Secondary | ICD-10-CM | POA: Diagnosis not present

## 2018-09-03 DIAGNOSIS — I272 Pulmonary hypertension, unspecified: Secondary | ICD-10-CM | POA: Diagnosis not present

## 2018-09-03 DIAGNOSIS — Z7901 Long term (current) use of anticoagulants: Secondary | ICD-10-CM | POA: Diagnosis not present

## 2018-09-03 DIAGNOSIS — Z8546 Personal history of malignant neoplasm of prostate: Secondary | ICD-10-CM

## 2018-09-03 DIAGNOSIS — J439 Emphysema, unspecified: Secondary | ICD-10-CM | POA: Diagnosis not present

## 2018-09-03 DIAGNOSIS — J449 Chronic obstructive pulmonary disease, unspecified: Secondary | ICD-10-CM | POA: Diagnosis not present

## 2018-09-03 DIAGNOSIS — N183 Chronic kidney disease, stage 3 unspecified: Secondary | ICD-10-CM

## 2018-09-03 DIAGNOSIS — Z79899 Other long term (current) drug therapy: Secondary | ICD-10-CM

## 2018-09-03 DIAGNOSIS — Z87891 Personal history of nicotine dependence: Secondary | ICD-10-CM

## 2018-09-03 DIAGNOSIS — J9611 Chronic respiratory failure with hypoxia: Secondary | ICD-10-CM | POA: Diagnosis not present

## 2018-09-03 DIAGNOSIS — J189 Pneumonia, unspecified organism: Secondary | ICD-10-CM | POA: Diagnosis not present

## 2018-09-03 DIAGNOSIS — E1122 Type 2 diabetes mellitus with diabetic chronic kidney disease: Secondary | ICD-10-CM | POA: Diagnosis not present

## 2018-09-03 DIAGNOSIS — J9621 Acute and chronic respiratory failure with hypoxia: Secondary | ICD-10-CM | POA: Diagnosis not present

## 2018-09-03 DIAGNOSIS — K2289 Other specified disease of esophagus: Secondary | ICD-10-CM

## 2018-09-03 DIAGNOSIS — E119 Type 2 diabetes mellitus without complications: Secondary | ICD-10-CM

## 2018-09-03 DIAGNOSIS — I5033 Acute on chronic diastolic (congestive) heart failure: Secondary | ICD-10-CM | POA: Diagnosis not present

## 2018-09-03 DIAGNOSIS — J181 Lobar pneumonia, unspecified organism: Secondary | ICD-10-CM | POA: Diagnosis not present

## 2018-09-03 DIAGNOSIS — I2510 Atherosclerotic heart disease of native coronary artery without angina pectoris: Secondary | ICD-10-CM | POA: Diagnosis not present

## 2018-09-03 DIAGNOSIS — Z952 Presence of prosthetic heart valve: Secondary | ICD-10-CM

## 2018-09-03 DIAGNOSIS — I480 Paroxysmal atrial fibrillation: Secondary | ICD-10-CM | POA: Diagnosis not present

## 2018-09-03 DIAGNOSIS — I130 Hypertensive heart and chronic kidney disease with heart failure and stage 1 through stage 4 chronic kidney disease, or unspecified chronic kidney disease: Secondary | ICD-10-CM | POA: Diagnosis not present

## 2018-09-03 DIAGNOSIS — Z9981 Dependence on supplemental oxygen: Secondary | ICD-10-CM | POA: Diagnosis not present

## 2018-09-03 DIAGNOSIS — Z8673 Personal history of transient ischemic attack (TIA), and cerebral infarction without residual deficits: Secondary | ICD-10-CM

## 2018-09-03 NOTE — Progress Notes (Signed)
OFFICE VISIT  09/03/2018   CC:  Chief Complaint  Patient presents with  . Follow-up    CHF   HPI:    Patient is a 82 y.o. Caucasian male who presents accompanied by his wife for 1 week f/u multiple chronic medical problems: Acute on chronic hypoxic RF.  Recent bilat pneumonia + acute on chronic diastolic HF. CRI 3.  PAF.  Atypical CP.  DM 2.   Feels like breathing is the same. He has not had any further chest pain.  No signif palpitations.  No dizziness.  No hemoptysis. He has been very hungry and has been eating a lot.  Says he feels no more swelling compared to last visit. Voiding fine. Some constipation.  No fevers.  No signif cough or phlegm. Not monitoring glucoses or BPs at home at this time.  PT came to his home this morning.  Past Medical History:  Diagnosis Date  . Adrenal adenoma   . Atrial fibrillation (HCC)    Rate control + Coumadin  . CAD (coronary artery disease)    Lexiscan Myoview (01/2014): Lateral soft tissue attenuation, no ischemia, not gated, low risk  . Cancer ([place])   . CAP (community acquired pneumonia)    Hospitalized 10/2014  . Chronic diastolic heart failure ([place])   . Chronic hypoxemic respiratory failure (HCC)    from COPD  . Chronic renal insufficiency, stage III (moderate) (HCC)    CrCl 50s  . COPD (chronic obstructive pulmonary disease) (HCC)    oxygen-dependent (2.5 L 24/7).  Improved on starting chronic prednisone 06/2017 (increased to 20 mg qd 08/2017)  Progressive worsening dyspnea through 2019: 3 L oxygen, 30mg  prednisone qd chronically as of 07/2018.  . Diabetes mellitus without complication ([place]) 26/37/8588   A1c 6.5 % 05/2016  . Diverticulosis   . Dyslipidemia   . Embolic stroke ([place])    d/t endocarditis 2008  . Hemorrhoids   . History of mitral valve replacement    needs SBE prophylaxis  . History of prostate cancer remote past   f/u by Dr. Claris Che; no recurrence as of f/u 10/2015.  PSA rising from 2013 to 04/2018 (PSA  04/25/18=18.49): urol to rpt PSA and check CT and bone scan  approx 10/2018.  Marland Kitchen HTN (hypertension)   . Left lower lobe pulmonary nodule July/Aug 2019.   04/2018: enlarging irregular nodular opacity.  CT surgery-->PET scan: inflamm/post-infect vs malignant. Plan f/u CT chest 3 mo per CT surg.  . Mediastinal lymphadenopathy   . Pulmonary hypertension ([place]) 01/09/2015   secondary  . Right thyroid nodule   . Thrombocytopenia ([place])   . TIA (transient ischemic attack) 03/25/2011   ? of  (transient diplopia)  . Ulcerative colitis    left-sided/segmental, associated with diverticulosis.  Sulfasalazine per GI (Dr. Carlean Purl).  . Warthin's tumor     Past Surgical History:  Procedure Laterality Date  . carotid duplex dopplers  04/2014   No signif plaques; repeat 2 yrs per cardiology  . CATARACT EXTRACTION, BILATERAL    . COLONOSCOPY W/ BIOPSIES  12/19/2007   left-sided colitis, diverticulosis, hemorrhoids  . FLEXIBLE SIGMOIDOSCOPY  01/11/2008; 03/19/2010   2009 and 2011:segmental left colitis, diverticulosis, hemorrhoids  . INGUINAL HERNIA REPAIR     right  . MITRAL VALVE REPAIR  09/22/04   and modified Cox-Maze IV   . MITRAL VALVE REPAIR  09/14/05   redo MV repair d/t endocardiits/embolic events  . PROSTATECTOMY  06/2003   Radical  . RIGHT HEART CATHETERIZATION N/A 01/01/2015  Procedure: RIGHT HEART CATH;  Surgeon: Peter M Martinique, MD;  Location: [facility] C[facility] LAB;  Service: Cardiovascular;  Laterality: N/A;  . SALIVARY GLAND SURGERY     left resection  . TRANSTHORACIC ECHOCARDIOGRAM  11/2013; 07/2017; 08/16/18   2015: Mod LVH, EF 55-60%, no WM abnormalities, +LA dilation, +severe RV dilation and impaired systolic fxn, +increased pulm art pressures.  Valves ok.  2018- EF 60-65%, wall motion nl, s/p MV repair, mild RV syst dysf.  07/2018 EF 50-55%, unable to eval LV diastolic function, +RV pressure overload.    Outpatient Medications Prior to Visit  Medication Sig Dispense Refill  . albuterol (PROVENTIL  HFA;VENTOLIN HFA) 108 (90 Base) MCG/ACT inhaler INHALE ONE PUFF BY MOUTH EVERY 6 HOURS AS NEEDED FOR WHEEZING AND FOR SHORTNESS OF BREATH (USING 5-6 TIMES EVERY 24 HOURS) (Patient taking differently: Inhale 1 puff into the lungs every 6 (six) hours as needed for wheezing or shortness of breath. ) 1 Inhaler 5  . albuterol (PROVENTIL) (2.5 MG/3ML) 0.083% nebulizer solution Take 3 mLs (2.5 mg total) by nebulization every 4 (four) hours. And as needed 300 mL 5  . benzonatate (TESSALON) 200 MG capsule Take 1 capsule (200 mg total) by mouth 3 (three) times daily. 20 capsule 0  . budesonide (PULMICORT) 0.5 MG/2ML nebulizer solution USE 1 VIAL IN NEBULIZER TWICE DAILY 120 mL 5  . digoxin (LANOXIN) 0.125 MG tablet Take 0.5 tablets (0.0625 mg total) by mouth daily. 30 tablet 0  . diltiazem (CARDIZEM CD) 240 MG 24 hr capsule Take 1 capsule (240 mg total) by mouth daily. 90 capsule 3  . glipiZIDE (GLUCOTROL XL) 10 MG 24 hr tablet Take 1 tablet (10 mg total) by mouth daily with breakfast. 90 tablet 1  . LORazepam (ATIVAN) 0.5 MG tablet Take 1 tablet by mouth daily as needed for anxiety.     . pantoprazole (PROTONIX) 40 MG tablet Take 1 tablet (40 mg total) by mouth daily. 30 tablet 3  . pravastatin (PRAVACHOL) 40 MG tablet TAKE 1 TABLET EVERY EVENING (Patient taking differently: Take 40 mg by mouth daily. TAKE 1 TABLET EVERY EVENING) 90 tablet 3  . predniSONE (DELTASONE) 20 MG tablet Take 1.5 tablets (30 mg total) by mouth daily with breakfast. 135 tablet 1  . STIOLTO RESPIMAT 2.5-2.5 MCG/ACT AERS INHALE 2 PUFFS INTO THE LUNGS DAILY. (Patient taking differently: Inhale 2 puffs into the lungs daily. ) 12 g 3  . torsemide (DEMADEX) 20 MG tablet Take 1 tablet (20 mg total) by mouth daily. 30 tablet 0  . warfarin (COUMADIN) 5 MG tablet Take 1/2 to 1 tablet by mouth daily as directed (Patient taking differently: Take 2.5-5 mg by mouth as directed. Take 1/2 tablet (2.5 mg) on MWF & Take 1 tablet (5 mg) all other days.) 90  tablet 1  . cefdinir (OMNICEF) 300 MG capsule Take 1 capsule (300 mg total) by mouth 2 (two) times daily. (Patient not taking: Reported on 09/03/2018) 8 capsule 0  . doxycycline (VIBRA-TABS) 100 MG tablet Take 1 tablet (100 mg total) by mouth 2 (two) times daily. (Patient not taking: Reported on 09/03/2018) 7 tablet 0   No facility-administered medications prior to visit.     No Known Allergies  ROS As per HPI  PE: Blood pressure 126/69, pulse (!) 104, temperature 98.2 F (36.8 C), temperature source Temporal, resp. rate 16, height 5\' 8"  (1.727 m), weight 183 lb (83 kg), SpO2 93 %.3 L oxygen (desat to 79% with walking from waiting room to  exam room). Gen: alert, tired appearing but in NAD. AFFECT: pleasant, lucid thought and speech. CV: irreg irreg, rate 100. Chest is clear, no wheezing or rales. Normal symmetric air entry throughout both lung fields. No chest wall deformities or tenderness. EXT: no clubbing or cyanosis.  1+ pitting RLL edema, but no pitting edema on L LL.    LABS:  Lab Results  Component Value Date   TSH 2.438 08/18/2018   Lab Results  Component Value Date   WBC 13.3 (H) 08/27/2018   HGB 13.2 08/27/2018   HCT 40.8 08/27/2018   MCV 82.5 08/27/2018   PLT 252.0 08/27/2018   Lab Results  Component Value Date   CREATININE 1.23 08/27/2018   BUN 25 (H) 08/27/2018   NA 140 08/27/2018   K 3.7 08/27/2018   CL 100 08/27/2018   CO2 31 08/27/2018   Lab Results  Component Value Date   ALT 18 05/19/2018   AST 16 05/19/2018   ALKPHOS 45 05/19/2018   BILITOT 1.3 (H) 05/19/2018   Lab Results  Component Value Date   CHOL 159 10/16/2017   Lab Results  Component Value Date   HDL 46.50 10/16/2017   Lab Results  Component Value Date   LDLCALC 79 10/12/2016   Lab Results  Component Value Date   TRIG 224.0 (H) 10/16/2017   Lab Results  Component Value Date   CHOLHDL 3 10/16/2017   Lab Results  Component Value Date   PSA 0.08 Test Methodology:  Hybritech PSA (L) 12/18/2007   PSA 0.04 Test Methodology: Hybritech PSA (L) 11/24/2007   Lab Results  Component Value Date   HGBA1C 8.8 (H) 07/24/2018    IMPRESSION AND PLAN:  1) Chronic hypoxic RF: pneumonia clinically resolved, no acute COPD exac, no acute diastolic HF. Continue digoxin, torsemide, stiolto, budesonide, scheduled prednisone, and supplemental oxygen to keep sats 88-92%, albuterol prn.    2) Chest pain; no recurrence since I saw him last week.  No known hx of CAD. Suspect this was pain of esophageal etiology.  Continue pantoprazole qd. Signs/symptoms to call or return for were reviewed and pt expressed understanding.  3) A-fib: asymptomatic, rate is fine.  Continue diltiazem, digoxin, and coumadin (coumadin managed by cardiology coumadin clinic.  4) CRI III: avoiding NSAIDs.  Balancing the precipice of cardiorenal syndrome.  5) DM 2: no changes at this time.  His control is acceptable at this time given his overall poor medical state of health AND the fact that he is on scheduled 30 mg qd prednisone.  6) LLL pulm nodule: pulm likely to follow this up with repeat CT. He is poor candidate for bx or cancer therapy (should he need any).  No labs or changes in meds today.  An After Visit Summary was printed and given to the patient.  FOLLOW UP: 2 mo  Signed:  Crissie Sickles, MD           09/03/2018

## 2018-09-06 DIAGNOSIS — I130 Hypertensive heart and chronic kidney disease with heart failure and stage 1 through stage 4 chronic kidney disease, or unspecified chronic kidney disease: Secondary | ICD-10-CM | POA: Diagnosis not present

## 2018-09-06 DIAGNOSIS — I5033 Acute on chronic diastolic (congestive) heart failure: Secondary | ICD-10-CM | POA: Diagnosis not present

## 2018-09-06 DIAGNOSIS — E1122 Type 2 diabetes mellitus with diabetic chronic kidney disease: Secondary | ICD-10-CM | POA: Diagnosis not present

## 2018-09-06 DIAGNOSIS — J439 Emphysema, unspecified: Secondary | ICD-10-CM | POA: Diagnosis not present

## 2018-09-06 DIAGNOSIS — I2510 Atherosclerotic heart disease of native coronary artery without angina pectoris: Secondary | ICD-10-CM | POA: Diagnosis not present

## 2018-09-06 DIAGNOSIS — J181 Lobar pneumonia, unspecified organism: Secondary | ICD-10-CM | POA: Diagnosis not present

## 2018-09-07 ENCOUNTER — Ambulatory Visit (INDEPENDENT_AMBULATORY_CARE_PROVIDER_SITE_OTHER): Payer: Medicare Other | Admitting: Cardiology

## 2018-09-07 ENCOUNTER — Ambulatory Visit (INDEPENDENT_AMBULATORY_CARE_PROVIDER_SITE_OTHER): Payer: Medicare Other | Admitting: Emergency Medicine

## 2018-09-07 ENCOUNTER — Encounter: Payer: Self-pay | Admitting: Emergency Medicine

## 2018-09-07 DIAGNOSIS — I2510 Atherosclerotic heart disease of native coronary artery without angina pectoris: Secondary | ICD-10-CM

## 2018-09-07 DIAGNOSIS — R911 Solitary pulmonary nodule: Secondary | ICD-10-CM | POA: Diagnosis not present

## 2018-09-07 DIAGNOSIS — J438 Other emphysema: Secondary | ICD-10-CM

## 2018-09-07 DIAGNOSIS — R918 Other nonspecific abnormal finding of lung field: Secondary | ICD-10-CM | POA: Diagnosis not present

## 2018-09-07 DIAGNOSIS — J9611 Chronic respiratory failure with hypoxia: Secondary | ICD-10-CM

## 2018-09-07 DIAGNOSIS — I5033 Acute on chronic diastolic (congestive) heart failure: Secondary | ICD-10-CM | POA: Diagnosis not present

## 2018-09-07 DIAGNOSIS — Z5181 Encounter for therapeutic drug level monitoring: Secondary | ICD-10-CM | POA: Diagnosis not present

## 2018-09-07 DIAGNOSIS — J181 Lobar pneumonia, unspecified organism: Secondary | ICD-10-CM | POA: Diagnosis not present

## 2018-09-07 DIAGNOSIS — I130 Hypertensive heart and chronic kidney disease with heart failure and stage 1 through stage 4 chronic kidney disease, or unspecified chronic kidney disease: Secondary | ICD-10-CM | POA: Diagnosis not present

## 2018-09-07 DIAGNOSIS — I4891 Unspecified atrial fibrillation: Secondary | ICD-10-CM

## 2018-09-07 DIAGNOSIS — J439 Emphysema, unspecified: Secondary | ICD-10-CM | POA: Diagnosis not present

## 2018-09-07 DIAGNOSIS — E1122 Type 2 diabetes mellitus with diabetic chronic kidney disease: Secondary | ICD-10-CM | POA: Diagnosis not present

## 2018-09-07 LAB — POCT INR: INR: 2.1 (ref 2.0–3.0)

## 2018-09-07 NOTE — Patient Instructions (Addendum)
We will perform a CT scan of your chest without contrast in June 2020 to follow your pulmonary nodules Please continue Stiolto as you have been taking it Please continue budesonide nebulizer (Pulmicort) as you have been taking it. Continue prednisone 30 mg daily. Continue your oxygen at all times as you have been using it. Follow with Dr Lamonte Sakai in 3 months or sooner if you have any problems.

## 2018-09-07 NOTE — Assessment & Plan Note (Signed)
Left lower lobe irregular nodular opacity was a more consolidated finding on the CT scan that was done in November, unclear as to whether there is been some resolution or whether the opacity was somewhat masked by pneumonia.  He also had some right lower lobe opacity.  Given his overall deconditioning, his probable poor tolerance of biopsy or cancer therapy, we have decided to be conservative here.  We will follow with a repeat CT, defer any plans for biopsy, PET scan or other evaluation for now.  He may at some point be willing to consider palliative therapy but does not want to pursue as long as he is asymptomatic.

## 2018-09-07 NOTE — Patient Instructions (Signed)
Spoke with Morey Hummingbird, [facility]  advised to have pt take 1/2 tablet daily except 1 tablet on Sundays, Tuesdays and Thursdays.  Prednisone 30 mg daily maintenance dose. Recheck INR in 2 week.  Call with new medications or any changes [phone].

## 2018-09-07 NOTE — Progress Notes (Signed)
Subjective:    Patient ID: [name], male    DOB: Jan 06, 1936, 82 y.o.   MRN: [id number]  COPD  His past medical history is significant for COPD.   ROV 12/29/17 --patient follows up today for his history of tobacco use and moderate to severe obstruction/COPD with associated hypoxemic respiratory failure.  He has a history of mitral valve repair, atrial fibrillation (maze), coronary disease with secondary pulmonary hypertension.  He has been on chronic prednisone long-term, currently on 20 mg daily. He is having more nasal congestion since last time. Also some increased LE edema for the last month, started glipizide at that time. Minimal wheeze, occasional cough happens  every day, productive of yellow to dark, thick. He is on [employer]. He uses a neb every morning, I think it is budesonide - he can't tell me. He believes that he has had nmore LE edema since last time. Has increased his lasix for last couple days. He is scheduled to see Dr Stanford Breed next week.   ROV 05/28/18 --82 year old man with severe COPD, associated hypoxemic respiratory failure, history of atrial fibrillation (maze), coronary disease, mitral valve repair.  He has been on oxygen and chronic prednisone dependent.    He was treated for a left lower lobe pneumonia due to a rounded infiltrate noted on CT chest 03/04/2018.  A follow-up CT from 04/24/2018 showed interval increase in size of the left lower lobe consolidated irregular area.  This prompted a PET scan that was done on 05/16/2018 which I have reviewed.  There was a slight decrease in size in the left lower lobe opacity with mild FDG uptake, most consistent with a resolving inflammatory process as opposed to malignancy.  A 4 mm right upper lobe nodule was stable in size.  He saw Dr. Servando Snare to review.  A biopsy was deferred and the plan was made to follow his imaging, next in 3 months.   He was back in the ED 8/31 with acute dyspnea, was treated with continuous nebs. No real  evidence pulm edema.   Remains on stiolto, pred 20mg  daily. He is using albuterol nebs 4-5x a day. He is coughing up yellow mucous intermittently. No fevers. He is confused about the regimen - wife answers the questions. His o2 is usually on 3L/min. He has seen some desats to mid-80's at times w exertion.   ROV 07/17/2018 --this is a follow-up visit for 82 year old gentleman with severe COPD and chronic hypoxemic respiratory failure.  He also has atrial fibrillation, coronary disease history of mitral valve repair.  He is on chronic prednisone, currently 30 mg daily.  He has a left lower lobe opacity that we have followed with CT scans and then PET scan 05/16/2018, stable in size with mild hypermetabolism.  It was at his last visit that we increase to prednisone 30 mg.  He returns today reporting that his breathing and energy are improved. He still is quite limited, is basically wheelchair-bound. He denies any pain, has a stable cough prod of thick mucous. He is on pulmicort nebs, stiolto. Uses albuterol 5-6x a day. His o2 is at 3L/min at all times. He has some edema, is having his lasix adjusted by Dr Stanford Breed.   ROV 09/07/18 --82 year old chronically ill gentleman with severe COPD and associated hypoxemic respiratory failure.  He also has coronary disease, atrial fibrillation, history of mitral valve repair.  His functional capacity has been very limiting and we have been managing him with scheduled prednisone, increased this year  to 30 mg daily.  He also has a left lower lobe opacity that we have been following with serial imaging.  He had a repeat CT chest on 08/17/2018 that I have reviewed.  This was done when he was admitted to the hospital with an apparent CHF exacerbation.  CT did not show any evidence of pulmonary embolism but did show progressive left lower lobe collapse versus consolidation, some new right lower lobe consolidation, stable mediastinal and right hilar lymphadenopathy. He was treated  with both abx and diuretics. No fever or sputum at that time. He is doing home PT. Using albuterol nebs 1-2x a day. Discharged on torsemide.    No flowsheet data found.  Review of Systems As per HPI     Objective:   Physical Exam Vitals:   09/07/18 0915  BP: 118/68  Pulse: 88  SpO2: 93%  Weight: 181 lb 12.8 oz (82.5 kg)  Height: 5\' 8"  (1.727 m)   Gen: Pleasant, elderly gentleman, in no distress,  normal affect on 3 L/m pulse flow  ENT: No lesions,  mouth clear,  oropharynx clear, no postnasal drip  Neck: No JVD, no stridor  Lungs: No use of accessory muscles, Distant but clear.  No wheezing, no crackles  Cardiovascular: distant, irregular, no murmur or gallops, no peripheral edema  Musculoskeletal: No deformities, no cyanosis or clubbing  Neuro: alert, non focal  Skin: Warm, bruising on both deltoids      Assessment & Plan:  COPD (chronic obstructive pulmonary disease) with emphysema (HCC) Severe COPD, quite limiting.  Appears to be close to baseline currently following recent hospitalization for CHF and possible pneumonia.  Continue Stiolto, budesonide, oxygen, scheduled prednisone.    Chronic hypoxemic respiratory failure (HCC) Continue same oxygen dosing as ordered  Mass of lower lobe of left lung Left lower lobe irregular nodular opacity was a more consolidated finding on the CT scan that was done in November, unclear as to whether there is been some resolution or whether the opacity was somewhat masked by pneumonia.  He also had some right lower lobe opacity.  Given his overall deconditioning, his probable poor tolerance of biopsy or cancer therapy, we have decided to be conservative here.  We will follow with a repeat CT, defer any plans for biopsy, PET scan or other evaluation for now.  He may at some point be willing to consider palliative therapy but does not want to pursue as long as he is asymptomatic.  Baltazar Apo, MD, PhD 09/07/2018, 9:52 AM [place]  [facility] [phone] or if no answer [phone]

## 2018-09-07 NOTE — Assessment & Plan Note (Signed)
Severe COPD, quite limiting.  Appears to be close to baseline currently following recent hospitalization for CHF and possible pneumonia.  Continue Stiolto, budesonide, oxygen, scheduled prednisone.

## 2018-09-07 NOTE — Assessment & Plan Note (Signed)
Continue same oxygen dosing as ordered

## 2018-09-10 DIAGNOSIS — I2510 Atherosclerotic heart disease of native coronary artery without angina pectoris: Secondary | ICD-10-CM | POA: Diagnosis not present

## 2018-09-10 DIAGNOSIS — J181 Lobar pneumonia, unspecified organism: Secondary | ICD-10-CM | POA: Diagnosis not present

## 2018-09-10 DIAGNOSIS — J439 Emphysema, unspecified: Secondary | ICD-10-CM | POA: Diagnosis not present

## 2018-09-10 DIAGNOSIS — I5033 Acute on chronic diastolic (congestive) heart failure: Secondary | ICD-10-CM | POA: Diagnosis not present

## 2018-09-10 DIAGNOSIS — E1122 Type 2 diabetes mellitus with diabetic chronic kidney disease: Secondary | ICD-10-CM | POA: Diagnosis not present

## 2018-09-10 DIAGNOSIS — I130 Hypertensive heart and chronic kidney disease with heart failure and stage 1 through stage 4 chronic kidney disease, or unspecified chronic kidney disease: Secondary | ICD-10-CM | POA: Diagnosis not present

## 2018-09-11 DIAGNOSIS — I5033 Acute on chronic diastolic (congestive) heart failure: Secondary | ICD-10-CM | POA: Diagnosis not present

## 2018-09-11 DIAGNOSIS — J439 Emphysema, unspecified: Secondary | ICD-10-CM | POA: Diagnosis not present

## 2018-09-11 DIAGNOSIS — I130 Hypertensive heart and chronic kidney disease with heart failure and stage 1 through stage 4 chronic kidney disease, or unspecified chronic kidney disease: Secondary | ICD-10-CM | POA: Diagnosis not present

## 2018-09-11 DIAGNOSIS — I2510 Atherosclerotic heart disease of native coronary artery without angina pectoris: Secondary | ICD-10-CM | POA: Diagnosis not present

## 2018-09-11 DIAGNOSIS — J181 Lobar pneumonia, unspecified organism: Secondary | ICD-10-CM | POA: Diagnosis not present

## 2018-09-11 DIAGNOSIS — E1122 Type 2 diabetes mellitus with diabetic chronic kidney disease: Secondary | ICD-10-CM | POA: Diagnosis not present

## 2018-09-14 ENCOUNTER — Other Ambulatory Visit: Payer: Self-pay | Admitting: Family Medicine

## 2018-09-17 DIAGNOSIS — J439 Emphysema, unspecified: Secondary | ICD-10-CM | POA: Diagnosis not present

## 2018-09-17 DIAGNOSIS — E1122 Type 2 diabetes mellitus with diabetic chronic kidney disease: Secondary | ICD-10-CM | POA: Diagnosis not present

## 2018-09-17 DIAGNOSIS — I130 Hypertensive heart and chronic kidney disease with heart failure and stage 1 through stage 4 chronic kidney disease, or unspecified chronic kidney disease: Secondary | ICD-10-CM | POA: Diagnosis not present

## 2018-09-17 DIAGNOSIS — I2510 Atherosclerotic heart disease of native coronary artery without angina pectoris: Secondary | ICD-10-CM | POA: Diagnosis not present

## 2018-09-17 DIAGNOSIS — J181 Lobar pneumonia, unspecified organism: Secondary | ICD-10-CM | POA: Diagnosis not present

## 2018-09-17 DIAGNOSIS — I5033 Acute on chronic diastolic (congestive) heart failure: Secondary | ICD-10-CM | POA: Diagnosis not present

## 2018-09-18 ENCOUNTER — Ambulatory Visit (INDEPENDENT_AMBULATORY_CARE_PROVIDER_SITE_OTHER): Payer: Medicare Other | Admitting: Family Medicine

## 2018-09-18 ENCOUNTER — Encounter: Payer: Self-pay | Admitting: Family Medicine

## 2018-09-18 VITALS — BP 126/73 | HR 102 | Temp 97.2°F | Resp 16 | Ht 68.0 in | Wt 182.0 lb

## 2018-09-18 DIAGNOSIS — J9611 Chronic respiratory failure with hypoxia: Secondary | ICD-10-CM | POA: Diagnosis not present

## 2018-09-18 DIAGNOSIS — R05 Cough: Secondary | ICD-10-CM | POA: Diagnosis not present

## 2018-09-18 DIAGNOSIS — J40 Bronchitis, not specified as acute or chronic: Secondary | ICD-10-CM

## 2018-09-18 DIAGNOSIS — R059 Cough, unspecified: Secondary | ICD-10-CM

## 2018-09-18 DIAGNOSIS — R0602 Shortness of breath: Secondary | ICD-10-CM

## 2018-09-18 DIAGNOSIS — Z8701 Personal history of pneumonia (recurrent): Secondary | ICD-10-CM

## 2018-09-18 LAB — CBC WITH DIFFERENTIAL/PLATELET
Basophils Absolute: 0 10*3/uL (ref 0.0–0.1)
Basophils Relative: 0.2 % (ref 0.0–3.0)
Eosinophils Absolute: 0.1 10*3/uL (ref 0.0–0.7)
Eosinophils Relative: 0.6 % (ref 0.0–5.0)
HCT: 34.9 % — ABNORMAL LOW (ref 39.0–52.0)
Hemoglobin: 11.3 g/dL — ABNORMAL LOW (ref 13.0–17.0)
LYMPHS ABS: 1 10*3/uL (ref 0.7–4.0)
Lymphocytes Relative: 9.9 % — ABNORMAL LOW (ref 12.0–46.0)
MCHC: 32.4 g/dL (ref 30.0–36.0)
MCV: 82.8 fl (ref 78.0–100.0)
Monocytes Absolute: 0.5 10*3/uL (ref 0.1–1.0)
Monocytes Relative: 5.6 % (ref 3.0–12.0)
NEUTROS PCT: 83.7 % — AB (ref 43.0–77.0)
Neutro Abs: 8.1 10*3/uL — ABNORMAL HIGH (ref 1.4–7.7)
Platelets: 178 10*3/uL (ref 150.0–400.0)
RBC: 4.22 Mil/uL (ref 4.22–5.81)
RDW: 17.4 % — ABNORMAL HIGH (ref 11.5–15.5)
WBC: 9.7 10*3/uL (ref 4.0–10.5)

## 2018-09-18 LAB — BRAIN NATRIURETIC PEPTIDE: Pro B Natriuretic peptide (BNP): 78 pg/mL (ref 0.0–100.0)

## 2018-09-18 MED ORDER — TORSEMIDE 20 MG PO TABS: 20.0000 mg | ORAL_TABLET | Freq: Every day | ORAL | 0 refills | Status: AC

## 2018-09-18 MED ORDER — DOXYCYCLINE HYCLATE 100 MG PO TABS: 100.0000 mg | ORAL_TABLET | Freq: Two times a day (BID) | ORAL | 0 refills | Status: DC

## 2018-09-18 NOTE — Progress Notes (Signed)
[name] , 06/17/1936, 82 y.o., male MRN: [id number] Patient Care Team    Relationship Specialty Notifications Start End  McGowen, Adrian Blackwater, MD PCP - General Family Medicine  09/02/15   Lelon Perla, MD PCP - Cardiology Cardiology Admissions 08/17/18   Gatha Mayer, MD Consulting Physician Gastroenterology  04/08/11   Kathee Delton, MD Consulting Physician Pulmonary Disease  04/08/11   Lelon Perla, MD Consulting Physician Cardiology  04/08/11   Franchot Gallo, MD Consulting Physician Urology  04/08/11   Gatha Mayer, MD Consulting Physician Gastroenterology  10/16/12   Collene Gobble, MD Consulting Physician Pulmonary Disease  10/12/15   Shon Hough, MD Consulting Physician Ophthalmology  10/12/16   Grace Isaac, MD Consulting Physician Cardiothoracic Surgery  05/09/18     Chief Complaint  Patient presents with  . Cough    and chest congestion, O2 is dropping when he ambulates. Respiratory therapist came out yesterday and requested appt.      Subjective: Pt presents for an OV today with his wife, with complaints of decreasing oxygen saturations to 84% with ambulation documented by his home health physical therapist.  Patient has a chronic history of hypoxia, on 3 L of nasal cannula 24-7.  Review of pulmonology and PCP notes his goal is for his oxygen to remain between 88-92%.  Of significance, patient had been hospitalized in November for a significant bilateral pneumonia. Patient states that he is not really certain what happened yesterday.  He had a coughing attack when his physical therapist was present and he coughed up a brown sputum-like plug x2.  At that time his cough secretions have returned to being clear.  He was working with physical therapy around that time and it was also noted that his pulse ox was decreasing to 84% when he was ambulating.  He reports he "does not feel any differently than he has over the last year" today.  His wife also  states that he is in his usual condition.  States he might have a little bit of swelling in his lower extremities, but no more than usual.  He reports compliance with his inhalers, and did use his albuterol inhaler just prior to his appointment today.  His diuretic was changed from furosemide to torsemide during his hospital admission.  He states he took his last torsemide this morning.  Depression screen [facility] 2/9 08/29/2018 07/24/2018 11/08/2017 10/05/2015 09/26/2014  Decreased Interest 1 0 0 0 0  Down, Depressed, Hopeless 0 0 2 0 1  PHQ - 2 Score 1 0 2 0 1  Altered sleeping - 0 0 - -  Tired, decreased energy - 3 2 - -  Change in appetite - 0 0 - -  Feeling bad or failure about yourself  - 3 0 - -  Trouble concentrating - 0 0 - -  Moving slowly or fidgety/restless - 3 0 - -  Suicidal thoughts - 0 1 - -  PHQ-9 Score - 9 5 - -  Difficult doing work/chores - Very difficult Somewhat difficult - -  Some recent data might be hidden    No Known Allergies Social History   Tobacco Use  . Smoking status: Former Smoker    Packs/day: 3.00    Years: 60.00    Pack years: 180.00    Types: Cigarettes    Last attempt to quit: 09/19/2002    Years since quitting: 16.0  . Smokeless tobacco: Never Used  Substance Use  Topics  . Alcohol use: No    Alcohol/week: 0.0 standard drinks   Past Medical History:  Diagnosis Date  . Adrenal adenoma   . Atrial fibrillation (HCC)    Rate control + Coumadin  . CAD (coronary artery disease)    Lexiscan Myoview (01/2014): Lateral soft tissue attenuation, no ischemia, not gated, low risk  . Cancer ([place])   . CAP (community acquired pneumonia)    Hospitalized 10/2014  . Chronic diastolic heart failure ([place])   . Chronic hypoxemic respiratory failure (HCC)    from COPD  . Chronic renal insufficiency, stage III (moderate) (HCC)    CrCl 50s  . COPD (chronic obstructive pulmonary disease) (HCC)    oxygen-dependent (2.5 L 24/7).  Improved on starting chronic prednisone  06/2017 (increased to 20 mg qd 08/2017)  Progressive worsening dyspnea through 2019: 3 L oxygen, 30mg  prednisone qd chronically as of 07/2018.  . Diabetes mellitus without complication ([place]) 83/38/2505   A1c 6.5 % 05/2016  . Diverticulosis   . Dyslipidemia   . Embolic stroke ([place])    d/t endocarditis 2008  . Hemorrhoids   . History of mitral valve replacement    needs SBE prophylaxis  . History of prostate cancer remote past   f/u by Dr. Claris Che; no recurrence as of f/u 10/2015.  PSA rising from 2013 to 04/2018 (PSA 04/25/18=18.49): urol to rpt PSA and check CT and bone scan  approx 10/2018.  Marland Kitchen HTN (hypertension)   . Left lower lobe pulmonary nodule July/Aug 2019.   04/2018: enlarging irregular nodular opacity.  CT surgery-->PET scan: inflamm/post-infect vs malignant. Plan f/u CT chest 3 mo per CT surg.  . Mediastinal lymphadenopathy   . Pulmonary hypertension ([place]) 01/09/2015   secondary  . Right thyroid nodule   . Thrombocytopenia ([place])   . TIA (transient ischemic attack) 03/25/2011   ? of  (transient diplopia)  . Ulcerative colitis    left-sided/segmental, associated with diverticulosis.  Sulfasalazine per GI (Dr. Carlean Purl).  . Warthin's tumor    Past Surgical History:  Procedure Laterality Date  . carotid duplex dopplers  04/2014   No signif plaques; repeat 2 yrs per cardiology  . CATARACT EXTRACTION, BILATERAL    . COLONOSCOPY W/ BIOPSIES  12/19/2007   left-sided colitis, diverticulosis, hemorrhoids  . FLEXIBLE SIGMOIDOSCOPY  01/11/2008; 03/19/2010   2009 and 2011:segmental left colitis, diverticulosis, hemorrhoids  . INGUINAL HERNIA REPAIR     right  . MITRAL VALVE REPAIR  09/22/04   and modified Cox-Maze IV   . MITRAL VALVE REPAIR  09/14/05   redo MV repair d/t endocardiits/embolic events  . PROSTATECTOMY  06/2003   Radical  . RIGHT HEART CATHETERIZATION N/A 01/01/2015   Procedure: RIGHT HEART CATH;  Surgeon: Peter M Martinique, MD;  Location: [facility] CATH LAB;  Service: Cardiovascular;   Laterality: N/A;  . SALIVARY GLAND SURGERY     left resection  . TRANSTHORACIC ECHOCARDIOGRAM  11/2013; 07/2017; 08/16/18   2015: Mod LVH, EF 55-60%, no WM abnormalities, +LA dilation, +severe RV dilation and impaired systolic fxn, +increased pulm art pressures.  Valves ok.  2018- EF 60-65%, wall motion nl, s/p MV repair, mild RV syst dysf.  07/2018 EF 50-55%, unable to eval LV diastolic function, +RV pressure overload.   Family History  Problem Relation Age of Onset  . Cancer Mother        spinal  . Stroke Father   . Heart disease Father   . Colon cancer Neg Hx   . Heart  attack Neg Hx    Allergies as of 09/18/2018   No Known Allergies     Medication List       Accurate as of September 18, 2018  9:08 AM. Always use your most recent med list.        albuterol (2.5 MG/3ML) 0.083% nebulizer solution Commonly known as:  PROVENTIL Take 3 mLs (2.5 mg total) by nebulization every 4 (four) hours. And as needed   albuterol 108 (90 Base) MCG/ACT inhaler Commonly known as:  PROVENTIL HFA;VENTOLIN HFA INHALE ONE PUFF BY MOUTH EVERY 6 HOURS AS NEEDED FOR WHEEZING AND FOR SHORTNESS OF BREATH (USING 5-6 TIMES EVERY 24 HOURS)   benzonatate 200 MG capsule Commonly known as:  TESSALON Take 1 capsule (200 mg total) by mouth 3 (three) times daily.   budesonide 0.5 MG/2ML nebulizer solution Commonly known as:  PULMICORT USE 1 VIAL IN NEBULIZER TWICE DAILY   digoxin 0.125 MG tablet Commonly known as:  LANOXIN Take 0.5 tablets (0.0625 mg total) by mouth daily.   diltiazem 240 MG 24 hr capsule Commonly known as:  CARDIZEM CD Take 1 capsule (240 mg total) by mouth daily.   glipiZIDE 10 MG 24 hr tablet Commonly known as:  GLUCOTROL XL Take 1 tablet (10 mg total) by mouth daily with breakfast.   glipiZIDE 5 MG 24 hr tablet Commonly known as:  GLUCOTROL XL TAKE 1 TABLET BY MOUTH DAILY WITH BREAKFAST.   LORazepam 0.5 MG tablet Commonly known as:  ATIVAN Take 1 tablet by mouth daily as  needed for anxiety.   pantoprazole 40 MG tablet Commonly known as:  PROTONIX Take 1 tablet (40 mg total) by mouth daily.   pravastatin 40 MG tablet Commonly known as:  PRAVACHOL TAKE 1 TABLET EVERY EVENING   predniSONE 20 MG tablet Commonly known as:  DELTASONE Take 1.5 tablets (30 mg total) by mouth daily with breakfast.   STIOLTO RESPIMAT 2.5-2.5 MCG/ACT Aers Generic drug:  Tiotropium Bromide-Olodaterol INHALE 2 PUFFS INTO THE LUNGS DAILY.   torsemide 20 MG tablet Commonly known as:  DEMADEX Take 1 tablet (20 mg total) by mouth daily.   warfarin 5 MG tablet Commonly known as:  COUMADIN Take as directed by the anticoagulation clinic. If you are unsure how to take this medication, talk to your nurse or doctor. Original instructions:  Take 1/2 to 1 tablet by mouth daily as directed       All past medical history, surgical history, allergies, family history, immunizations andmedications were updated in the EMR today and reviewed under the history and medication portions of their EMR.     ROS: Negative, with the exception of above mentioned in HPI   Objective:  BP 126/73 (BP Location: Left Arm, Patient Position: Sitting, Cuff Size: Large)   Pulse (!) 102   Temp (!) 97.2 F (36.2 C) (Oral)   Resp 16   Ht 5\' 8"  (1.727 m)   Wt 182 lb (82.6 kg)   SpO2 92%   BMI 27.67 kg/m  Body mass index is 27.67 kg/m. Gen: Afebrile. No acute distress. Nontoxic in appearance, well developed, well nourished.  In wheelchair on 3 L nasal cannula. HENT: AT. [place]. Bilateral TM visualized no erythema or fullness. MMM, no oral lesions. Bilateral nares without erythema, drainage or swelling. Throat without erythema or exudates.  No cough. Eyes:Pupils Equal Round Reactive to light, Extraocular movements intact,  Conjunctiva without redness, discharge or icterus. Neck/lymp/endocrine: Supple, no lymphadenopathy CV: irregularly irregular.  Mildly tachycardic- his baseline, +  1 pitting edema right lower  extremity, trace edema left lower extremity Chest: CTAB, no wheeze or crackles. Good air movement, normal resp effort.  Neuro:  Alert. Oriented x3  Psych: Normal affect, dress and demeanor. Normal speech. Normal thought content and judgment.  No exam data present No results found. No results found for this or any previous visit (from the past 24 hour(s)).  Assessment/Plan: MATAS BURROWS is a 82 y.o. male present for OV for  Cough/Bronchitis/chronic hypoxia/shortness of breath/recent pneumonia -Although he does have some mild edema, this seems to be his baseline.  His weight is stable not suggesting fluid overload.  I did refill his Demadex for him for 90 days-he took his last today. -His oxygen saturation when getting him roomed had dropped to 86%, when he was sitting still and taking adequate deep breaths, his pulse ox climbed to 92%.  When speaking it would drop to 88% lastly with ambulation with physical therapy dropped to 84%. -Discussed options with he and his wife today and decided to treat as "bronchitis" since he was using his albuterol more frequently and he did cough up what sounds like a brown mucous plug twice.  He is to continue his prednisone 30 mg daily.  We will collect some labs today to rule out worsening infection or fluid overload.  Will arrange close follow-up for him -he may need his supplemental oxygen increased. - CBC w/Diff - B Nat Peptide - doxycycline (VIBRA-TABS) 100 MG tablet; Take 1 tablet (100 mg total) by mouth 2 (two) times daily.  Dispense: 20 tablet; Refill: 0 - CBC w/Diff - torsemide (DEMADEX) 20 MG tablet; Take 1 tablet (20 mg total) by mouth daily.  Dispense: 90 tablet; Refill: 0 -Follow-up Friday with PCP, appointment made for him recheck status before the weekend.  Urgent/emergent  precautions discussed with he and his wife today in the event his symptoms worsen over the holiday.    Reviewed expectations re: course of current medical  issues.  Discussed self-management of symptoms.  Outlined signs and symptoms indicating need for more acute intervention.  Patient verbalized understanding and all questions were answered.  Patient received an After-Visit Summary.    No orders of the defined types were placed in this encounter.    Note is dictated utilizing voice recognition software. Although note has been proof read prior to signing, occasional typographical errors still can be missed. If any questions arise, please do not hesitate to call for verification.   electronically signed by:  Howard Pouch, DO  [place]

## 2018-09-18 NOTE — Patient Instructions (Addendum)
Start doxycyline every 12 hours for 10 days.  We will call you with your lab results that will tell us if this is infectious vs fluid.  Follow up with Dr. Anitra Lauth Friday at 11 am.  If your oxygen drops below 88% when sitting- please go to ED.

## 2018-09-20 ENCOUNTER — Telehealth: Payer: Self-pay | Admitting: Family Medicine

## 2018-09-20 ENCOUNTER — Ambulatory Visit (INDEPENDENT_AMBULATORY_CARE_PROVIDER_SITE_OTHER): Payer: Medicare Other | Admitting: Internal Medicine

## 2018-09-20 ENCOUNTER — Telehealth: Payer: Self-pay | Admitting: *Deleted

## 2018-09-20 ENCOUNTER — Telehealth: Payer: Self-pay | Admitting: Emergency Medicine

## 2018-09-20 DIAGNOSIS — I2510 Atherosclerotic heart disease of native coronary artery without angina pectoris: Secondary | ICD-10-CM | POA: Diagnosis not present

## 2018-09-20 DIAGNOSIS — Z5181 Encounter for therapeutic drug level monitoring: Secondary | ICD-10-CM | POA: Diagnosis not present

## 2018-09-20 DIAGNOSIS — I4891 Unspecified atrial fibrillation: Secondary | ICD-10-CM

## 2018-09-20 DIAGNOSIS — J439 Emphysema, unspecified: Secondary | ICD-10-CM | POA: Diagnosis not present

## 2018-09-20 DIAGNOSIS — I130 Hypertensive heart and chronic kidney disease with heart failure and stage 1 through stage 4 chronic kidney disease, or unspecified chronic kidney disease: Secondary | ICD-10-CM | POA: Diagnosis not present

## 2018-09-20 DIAGNOSIS — I5033 Acute on chronic diastolic (congestive) heart failure: Secondary | ICD-10-CM | POA: Diagnosis not present

## 2018-09-20 DIAGNOSIS — E1122 Type 2 diabetes mellitus with diabetic chronic kidney disease: Secondary | ICD-10-CM | POA: Diagnosis not present

## 2018-09-20 DIAGNOSIS — J181 Lobar pneumonia, unspecified organism: Secondary | ICD-10-CM | POA: Diagnosis not present

## 2018-09-20 LAB — POCT INR: INR: 2 (ref 2.0–3.0)

## 2018-09-20 NOTE — Telephone Encounter (Signed)
Copied from [place] [phone]. Topic: General - Other >> Sep 20, 2018  3:53 PM Oneta Rack wrote: Osvaldo Human name: Herbert Deaner  Relation to pt: PT from [facility]  Call back number: [phone]   Reason for call:  Requesting orders for additional PT  2x 4. Patient has an appointment tomorrow 09/21/17 with PCP please address "oxygen dropping"

## 2018-09-20 NOTE — Telephone Encounter (Signed)
Copied message to another phone note.

## 2018-09-20 NOTE — Telephone Encounter (Signed)
This is fine with me   

## 2018-09-20 NOTE — Telephone Encounter (Signed)
Copied from [place] [phone]. Topic: Quick Communication - See Telephone Encounter >> Sep 20, 2018 12:26 PM Vernona Rieger wrote: CRM for notification. See Telephone encounter for: 09/20/18.  Clair Gulling, Physical Therapist with [facility] called for some information for Dr Anitra Lauth for patient's upcoming appointment tomorrow. He said that today, upon arriving, patient was in his recliner and reported at least 5 minutes with heart rate at 107, blood pressure is within normal limits. Patient on 4 liters of oxygen nasal. Oxygen at rest is 97% with Min to Moderate excursion with walking 40 feet & Oxygen drops to 84%, takes 2-3 minutes focused breathing to recover. At rest, pulse is regular with excursion shifts to moderate Afib. Patient has had several weeks of oxygen dropping with excursion, which is impairing progress and therapy. [phone]

## 2018-09-20 NOTE — Telephone Encounter (Signed)
Call made to Herbert Deaner PT at [facility], he states the patient's oxygen is dropping to 84% on 4L during his visit with the patient. The patient started doxycycline 100mg  bid 12.31.19 for increased SOB and coughing up dark sputum. Patient has an appointment with his PCP tomorrow.   Call made to patient, patient states he is taking is antibiotic and he just can't breathe. I did confirm with this patient that he does have an appt with his PCP tomorrow at 28, patient confirmed. I explained to the patient to drink plenty of fluids, take his antibiotic and to be sure to make his appointment with his PCP and to call us if he still feels like he needs to be seen. Voiced understanding. Nothing further is needed at this time.

## 2018-09-20 NOTE — Telephone Encounter (Signed)
Copied from [place] [phone]. Topic: General - Other >> Sep 20, 2018  3:53 PM Oneta Rack wrote: Osvaldo Human name: [name]  Relation to pt: PT from [facility]  Call back number: [phone]   Reason for call:  Requesting orders for additional PT  2x 4. Patient has an appointment tomorrow 09/21/17 with PCP please address "oxygen dropping"

## 2018-09-20 NOTE — Telephone Encounter (Signed)
Please advise. Thanks.  

## 2018-09-20 NOTE — Telephone Encounter (Signed)
Started on antibotic.Patient has follow up w/ primary care tomorrow.

## 2018-09-20 NOTE — Telephone Encounter (Signed)
Jim advised and voiced understanding. 

## 2018-09-20 NOTE — Telephone Encounter (Signed)
FYI

## 2018-09-21 ENCOUNTER — Other Ambulatory Visit (HOSPITAL_COMMUNITY): Payer: Self-pay | Admitting: Urology

## 2018-09-21 ENCOUNTER — Ambulatory Visit (INDEPENDENT_AMBULATORY_CARE_PROVIDER_SITE_OTHER): Payer: Medicare Other | Admitting: Family Medicine

## 2018-09-21 ENCOUNTER — Encounter: Payer: Self-pay | Admitting: Family Medicine

## 2018-09-21 VITALS — BP 124/60 | HR 79 | Temp 97.7°F | Resp 16 | Ht 68.0 in | Wt 181.0 lb

## 2018-09-21 DIAGNOSIS — I5032 Chronic diastolic (congestive) heart failure: Secondary | ICD-10-CM

## 2018-09-21 DIAGNOSIS — J449 Chronic obstructive pulmonary disease, unspecified: Secondary | ICD-10-CM | POA: Diagnosis not present

## 2018-09-21 DIAGNOSIS — F411 Generalized anxiety disorder: Secondary | ICD-10-CM | POA: Diagnosis not present

## 2018-09-21 DIAGNOSIS — J9611 Chronic respiratory failure with hypoxia: Secondary | ICD-10-CM

## 2018-09-21 DIAGNOSIS — C61 Malignant neoplasm of prostate: Secondary | ICD-10-CM

## 2018-09-21 MED ORDER — PREDNISONE 20 MG PO TABS: ORAL_TABLET | ORAL | 0 refills | Status: DC

## 2018-09-21 MED ORDER — LORAZEPAM 1 MG PO TABS: ORAL_TABLET | ORAL | 1 refills | Status: AC

## 2018-09-21 NOTE — Progress Notes (Signed)
OFFICE VISIT  09/21/2018   CC:  Chief Complaint  Patient presents with  . Follow-up    Bronchitis   HPI:    Patient is a 83 y.o. Caucasian male with chronic hypoxic resp failure (COPD and chronic diastolic HF), a-fib (on coumadin), and CRI with GFR in the 50s. who presents for 3 d f/u acute (though brief) worsening of SOB, cough, and hypoxia--acute copd exac.  Sounds like he had been coughing up some mucous plugs, desating to low 80s with ambulation.  He was put on doxy 100 bid x 10d and his torsemide was continued at 20mg  qd, as he did not appear to be volume overloaded. Reviewed chronic meds: all correct.  Interim hx: Still with oxygen in 84-94% range at rest on 3 L pulsed [place] (4L at home with concentrator with long hose). He feels like his breathing is at baseline at rest, and his DOE is also the same (which is very signif with only a step or two.  He does not feel any different when oxygen drops. Tolerating doxy fine. Taking all other chronic prescribed meds. Eating and drinking well. Bowels normal, urinating fine. No feeling of fluid accumulation.  He is more anxious and easily emotional lately.  "I can cry just looking at the wall".  Describes himself as being very sentimental lately.  He chronically takes 0.5mg  lorazepam once daily.  Past Medical History:  Diagnosis Date  . Adrenal adenoma   . Atrial fibrillation (HCC)    Rate control + Coumadin  . CAD (coronary artery disease)    Lexiscan Myoview (01/2014): Lateral soft tissue attenuation, no ischemia, not gated, low risk  . Cancer ([place])   . CAP (community acquired pneumonia)    Hospitalized 10/2014  . Chronic diastolic heart failure ([place])   . Chronic hypoxemic respiratory failure (HCC)    from COPD  . Chronic renal insufficiency, stage III (moderate) (HCC)    CrCl 50s  . COPD (chronic obstructive pulmonary disease) (HCC)    oxygen-dependent (2.5 L 24/7).  Improved on starting chronic prednisone 06/2017 (increased to 20  mg qd 08/2017)  Progressive worsening dyspnea through 2019: 3 L oxygen, 30mg  prednisone qd chronically as of 07/2018.  . Diabetes mellitus without complication ([place]) 41/66/0630   A1c 6.5 % 05/2016  . Diverticulosis   . Dyslipidemia   . Embolic stroke ([place])    d/t endocarditis 2008  . Hemorrhoids   . History of mitral valve replacement    needs SBE prophylaxis  . History of prostate cancer remote past   f/u by Dr. Claris Che; no recurrence as of f/u 10/2015.  PSA rising from 2013 to 04/2018 (PSA 04/25/18=18.49): urol to rpt PSA and check CT and bone scan  approx 10/2018.  Marland Kitchen HTN (hypertension)   . Left lower lobe pulmonary nodule July/Aug 2019.   04/2018: enlarging irregular nodular opacity.  CT surgery-->PET scan: inflamm/post-infect vs malignant. Orig plan was to f/u CT chest 3 mo per CT surg, then pt was admitted again for pneumonia and rpt CT chest showed equivocal change in the region of question, pulm MD plan was to repeat CT chest again 02/2019.  . Mediastinal lymphadenopathy   . Pulmonary hypertension ([place]) 01/09/2015   secondary  . Right thyroid nodule   . Thrombocytopenia ([place])   . TIA (transient ischemic attack) 03/25/2011   ? of  (transient diplopia)  . Ulcerative colitis    left-sided/segmental, associated with diverticulosis.  Sulfasalazine per GI (Dr. Carlean Purl).  . Warthin's tumor  Past Surgical History:  Procedure Laterality Date  . carotid duplex dopplers  04/2014   No signif plaques; repeat 2 yrs per cardiology  . CATARACT EXTRACTION, BILATERAL    . COLONOSCOPY W/ BIOPSIES  12/19/2007   left-sided colitis, diverticulosis, hemorrhoids  . FLEXIBLE SIGMOIDOSCOPY  01/11/2008; 03/19/2010   2009 and 2011:segmental left colitis, diverticulosis, hemorrhoids  . INGUINAL HERNIA REPAIR     right  . MITRAL VALVE REPAIR  09/22/04   and modified Cox-Maze IV   . MITRAL VALVE REPAIR  09/14/05   redo MV repair d/t endocardiits/embolic events  . PROSTATECTOMY  06/2003   Radical  . RIGHT  HEART CATHETERIZATION N/A 01/01/2015   Procedure: RIGHT HEART CATH;  Surgeon: Peter M Martinique, MD;  Location: [facility];  Service: Cardiovascular;  Laterality: N/A;  . SALIVARY GLAND SURGERY     left resection  . TRANSTHORACIC ECHOCARDIOGRAM  11/2013; 07/2017; 08/16/18   2015: Mod LVH, EF 55-60%, no WM abnormalities, +LA dilation, +severe RV dilation and impaired systolic fxn, +increased pulm art pressures.  Valves ok.  2018- EF 60-65%, wall motion nl, s/p MV repair, mild RV syst dysf.  07/2018 EF 50-55%, unable to eval LV diastolic function, +RV pressure overload.    Outpatient Medications Prior to Visit  Medication Sig Dispense Refill  . albuterol (PROVENTIL HFA;VENTOLIN HFA) 108 (90 Base) MCG/ACT inhaler INHALE ONE PUFF BY MOUTH EVERY 6 HOURS AS NEEDED FOR WHEEZING AND FOR SHORTNESS OF BREATH (USING 5-6 TIMES EVERY 24 HOURS) (Patient taking differently: Inhale 1 puff into the lungs every 6 (six) hours as needed for wheezing or shortness of breath. ) 1 Inhaler 5  . albuterol (PROVENTIL) (2.5 MG/3ML) 0.083% nebulizer solution Take 3 mLs (2.5 mg total) by nebulization every 4 (four) hours. And as needed 300 mL 5  . benzonatate (TESSALON) 200 MG capsule Take 1 capsule (200 mg total) by mouth 3 (three) times daily. 20 capsule 0  . budesonide (PULMICORT) 0.5 MG/2ML nebulizer solution USE 1 VIAL IN NEBULIZER TWICE DAILY 120 mL 5  . digoxin (LANOXIN) 0.125 MG tablet Take 0.5 tablets (0.0625 mg total) by mouth daily. 30 tablet 0  . diltiazem (CARDIZEM CD) 240 MG 24 hr capsule Take 1 capsule (240 mg total) by mouth daily. 90 capsule 3  . doxycycline (VIBRA-TABS) 100 MG tablet Take 1 tablet (100 mg total) by mouth 2 (two) times daily. 20 tablet 0  . glipiZIDE (GLUCOTROL XL) 10 MG 24 hr tablet Take 1 tablet (10 mg total) by mouth daily with breakfast. 90 tablet 1  . pantoprazole (PROTONIX) 40 MG tablet Take 1 tablet (40 mg total) by mouth daily. 30 tablet 3  . pravastatin (PRAVACHOL) 40 MG tablet TAKE 1  TABLET EVERY EVENING (Patient taking differently: Take 40 mg by mouth daily. TAKE 1 TABLET EVERY EVENING) 90 tablet 3  . predniSONE (DELTASONE) 20 MG tablet Take 1.5 tablets (30 mg total) by mouth daily with breakfast. 135 tablet 1  . STIOLTO RESPIMAT 2.5-2.5 MCG/ACT AERS INHALE 2 PUFFS INTO THE LUNGS DAILY. (Patient taking differently: Inhale 2 puffs into the lungs daily. ) 12 g 3  . torsemide (DEMADEX) 20 MG tablet Take 1 tablet (20 mg total) by mouth daily. 90 tablet 0  . warfarin (COUMADIN) 5 MG tablet Take 1/2 to 1 tablet by mouth daily as directed (Patient taking differently: Take 2.5-5 mg by mouth as directed. Take 1/2 tablet (2.5 mg) on MWF & Take 1 tablet (5 mg) all other days.) 90 tablet 1  .  LORazepam (ATIVAN) 0.5 MG tablet Take 1 tablet by mouth daily as needed for anxiety.     Marland Kitchen glipiZIDE (GLUCOTROL XL) 5 MG 24 hr tablet TAKE 1 TABLET BY MOUTH DAILY WITH BREAKFAST. (Patient not taking: Reported on 09/21/2018) 90 tablet 0   No facility-administered medications prior to visit.     No Known Allergies  ROS As per HPI  PE: Blood pressure 124/60, pulse 79, temperature 97.7 F (36.5 C), temperature source Oral, resp. rate 16, height 5\' 8"  (1.727 m), weight 181 lb (82.1 kg), SpO2 (!) 84 %. Gen: sitting in WC, looks chronically ill/tired, mouth breathing mostly, sometimes rapid and shallow but can do slow and deep through nose when coached.  He is in NAD.  He is talkative and smiling/good natured/lucid. EYC:XKGY: no injection, icteris, swelling, or exudate.  EOMI, PERRLA. Mouth: lips without lesion/swelling.  Oral mucosa pink and moist. Oropharynx without erythema, exudate, or swelling.  CV: Irreg irreg, rate ranging from 80s- 120s on his pulse ox, oxygen ranging from 84-94% at rest-->maintains good sat when he breaths slow and deep through his nose--currently on 3 L oxygen [place]. Lungs: R base clear on insp and exp, but he has e to a changes. Left lung lower 1/2 with insp crackles and  diminished breath sounds. Abd: soft, NT/ND EXT: no clubbing or cyanosis.  Trace bilat LL pitting edema.    LABS:    Chemistry      Component Value Date/Time   NA 140 08/27/2018 1148   K 3.7 08/27/2018 1148   CL 100 08/27/2018 1148   CO2 31 08/27/2018 1148   BUN 25 (H) 08/27/2018 1148   CREATININE 1.23 08/27/2018 1148      Component Value Date/Time   CALCIUM 9.7 08/27/2018 1148   ALKPHOS 45 05/19/2018 1700   AST 16 05/19/2018 1700   ALT 18 05/19/2018 1700   BILITOT 1.3 (H) 05/19/2018 1700     Lab Results  Component Value Date   WBC 9.7 09/18/2018   HGB 11.3 (L) 09/18/2018   HCT 34.9 (L) 09/18/2018   MCV 82.8 09/18/2018   PLT 178.0 09/18/2018   Pro BNP 09/18/18 was 78 (wnl).  IMPRESSION AND PLAN:  1) Chronic hypoxic/hypercarbic resp failure: he waxes and wanes; hard to tell if he is actually in an acute flare. His COPD is so bad that I think he is nearing need for change to palliative care or hospice.  He may have lung cancer (plan for repeat CT 02/2019 per pulmonologist).  He is in no shape for w/u for lung ca or treatment at this time. Will proceed with doing our best to care for him as outpt.  He admitted today that he didn't know how much longer he could keep getting out of bed 4-5 times per night by himself to urinate.  When placement in ALF was mentioned he changed the subject.  His emotional lability lately tells me he is starting to realize and accept how poor his health is. Continue oxygen to keep sats between 88-92%.  Enccouraged focus on calm, slow, deep breaths through nose. To try to help with this, I am increasing his lorazepam to 1mg  and recommended he take 1 bid. Will try prednisone burst: 60mg  qd x 4d, then 40mg  qd x 4d, then resume daily scheduled dosing of 30mg  qd. Continue doxycycline. I don't thnk he is volume overloaded.  No change in diuretic dosing today.An After Visit Summary was printed and given to the patient.  Spent 40 min  with pt today, with  >50% of this time spent in counseling and care coordination regarding the above problems.  An After Visit Summary was printed and given to the patient.  FOLLOW UP: Return in about 5 days (around 09/26/2018) for f/u resp.  Signed:  Crissie Sickles, MD           09/21/2018

## 2018-09-24 ENCOUNTER — Ambulatory Visit (INDEPENDENT_AMBULATORY_CARE_PROVIDER_SITE_OTHER): Payer: Medicare Other | Admitting: Cardiovascular Disease

## 2018-09-24 DIAGNOSIS — I4891 Unspecified atrial fibrillation: Secondary | ICD-10-CM | POA: Diagnosis not present

## 2018-09-24 DIAGNOSIS — J439 Emphysema, unspecified: Secondary | ICD-10-CM | POA: Diagnosis not present

## 2018-09-24 DIAGNOSIS — I2510 Atherosclerotic heart disease of native coronary artery without angina pectoris: Secondary | ICD-10-CM | POA: Diagnosis not present

## 2018-09-24 DIAGNOSIS — E1122 Type 2 diabetes mellitus with diabetic chronic kidney disease: Secondary | ICD-10-CM | POA: Diagnosis not present

## 2018-09-24 DIAGNOSIS — I130 Hypertensive heart and chronic kidney disease with heart failure and stage 1 through stage 4 chronic kidney disease, or unspecified chronic kidney disease: Secondary | ICD-10-CM | POA: Diagnosis not present

## 2018-09-24 DIAGNOSIS — Z5181 Encounter for therapeutic drug level monitoring: Secondary | ICD-10-CM

## 2018-09-24 DIAGNOSIS — J181 Lobar pneumonia, unspecified organism: Secondary | ICD-10-CM | POA: Diagnosis not present

## 2018-09-24 DIAGNOSIS — I5033 Acute on chronic diastolic (congestive) heart failure: Secondary | ICD-10-CM | POA: Diagnosis not present

## 2018-09-24 LAB — POCT INR: INR: 2.3 (ref 2.0–3.0)

## 2018-09-24 NOTE — Patient Instructions (Signed)
Description   Spoke with Morey Hummingbird, [facility] advised to have pt take 1/2 tablet daily except 1 tablet on Sundays, Tuesdays and Thursdays.  Prednisone 30 mg daily maintenance dose. Recheck INR on Wednesday. Pt on Doxy & Prednisone.  Call with new medications or any changes [phone].

## 2018-09-26 ENCOUNTER — Ambulatory Visit (INDEPENDENT_AMBULATORY_CARE_PROVIDER_SITE_OTHER): Payer: Medicare Other | Admitting: Cardiology

## 2018-09-26 ENCOUNTER — Ambulatory Visit: Payer: Medicare Other | Admitting: Family Medicine

## 2018-09-26 DIAGNOSIS — Z5181 Encounter for therapeutic drug level monitoring: Secondary | ICD-10-CM

## 2018-09-26 DIAGNOSIS — I5033 Acute on chronic diastolic (congestive) heart failure: Secondary | ICD-10-CM | POA: Diagnosis not present

## 2018-09-26 DIAGNOSIS — I130 Hypertensive heart and chronic kidney disease with heart failure and stage 1 through stage 4 chronic kidney disease, or unspecified chronic kidney disease: Secondary | ICD-10-CM | POA: Diagnosis not present

## 2018-09-26 DIAGNOSIS — J181 Lobar pneumonia, unspecified organism: Secondary | ICD-10-CM | POA: Diagnosis not present

## 2018-09-26 DIAGNOSIS — I4891 Unspecified atrial fibrillation: Secondary | ICD-10-CM

## 2018-09-26 DIAGNOSIS — J439 Emphysema, unspecified: Secondary | ICD-10-CM | POA: Diagnosis not present

## 2018-09-26 DIAGNOSIS — I2510 Atherosclerotic heart disease of native coronary artery without angina pectoris: Secondary | ICD-10-CM | POA: Diagnosis not present

## 2018-09-26 DIAGNOSIS — E1122 Type 2 diabetes mellitus with diabetic chronic kidney disease: Secondary | ICD-10-CM | POA: Diagnosis not present

## 2018-09-26 LAB — POCT INR: INR: 2.8 (ref 2.0–3.0)

## 2018-09-26 NOTE — Patient Instructions (Signed)
Description   Spoke with Morey Hummingbird, [facility] advised to have pt to take 1/2 tablet tomorrow then resume taking 1/2 tablets daily except 1 tablet on Sundays, Tuesdays and Thursdays.  Prednisone 30 mg daily maintenance dose. Recheck INR in 1 week. Call with new medications or any changes [phone].

## 2018-09-27 ENCOUNTER — Encounter: Payer: Self-pay | Admitting: Family Medicine

## 2018-09-27 ENCOUNTER — Ambulatory Visit (INDEPENDENT_AMBULATORY_CARE_PROVIDER_SITE_OTHER): Payer: Medicare Other | Admitting: Family Medicine

## 2018-09-27 VITALS — BP 117/67 | HR 88 | Temp 98.2°F | Resp 16 | Ht 68.0 in | Wt 180.0 lb

## 2018-09-27 DIAGNOSIS — I5032 Chronic diastolic (congestive) heart failure: Secondary | ICD-10-CM

## 2018-09-27 DIAGNOSIS — J449 Chronic obstructive pulmonary disease, unspecified: Secondary | ICD-10-CM | POA: Diagnosis not present

## 2018-09-27 DIAGNOSIS — N183 Chronic kidney disease, stage 3 unspecified: Secondary | ICD-10-CM

## 2018-09-27 DIAGNOSIS — E119 Type 2 diabetes mellitus without complications: Secondary | ICD-10-CM

## 2018-09-27 LAB — BASIC METABOLIC PANEL
BUN: 24 mg/dL — ABNORMAL HIGH (ref 6–23)
CO2: 25 mEq/L (ref 19–32)
Calcium: 9.3 mg/dL (ref 8.4–10.5)
Chloride: 101 mEq/L (ref 96–112)
Creatinine, Ser: 1.17 mg/dL (ref 0.40–1.50)
GFR: 63.62 mL/min (ref >60.00)
Glucose, Bld: 285 mg/dL — ABNORMAL HIGH (ref 70–99)
POTASSIUM: 4 meq/L (ref 3.5–5.1)
Sodium: 138 mEq/L (ref 135–145)

## 2018-09-27 NOTE — Progress Notes (Signed)
OFFICE VISIT  09/27/2018   CC:  Chief Complaint  Patient presents with  . Follow-up    Resp   HPI:    Patient is a 83 y.o. Caucasian male who presents for 6 day f/u chronic hypoxic resp failure. Last visit I rx'd him an 8 day prednisone burst (60mg  qd x 4d, then 40mg  qd x 4d) and he was to finish doxycycline. I did not think he was volume overloaded so I did not change his diuretic regimen. I did increase his lorazepam to 1 mg bid in order to try to help decrease his anxiety-driven abnl breathing patterns. He wears 3L oxyg with portable, 4L with concentrator with long tubing at home.  Interim hx: Has a weaker, nonproductive cough in daytime, which seems improved. Nighttime he has worse cough, productive of yellowish, white sputum.   Oxygen staying around 90-92 at rest.  When he gets up to walk to bathroom it drops to low 80s. No fluid pill yesterday or today-->he refused it yesterday b/c he is so tired of getting up and urinating so much.  Past Medical History:  Diagnosis Date  . Adrenal adenoma   . Atrial fibrillation (HCC)    Rate control + Coumadin  . CAD (coronary artery disease)    Lexiscan Myoview (01/2014): Lateral soft tissue attenuation, no ischemia, not gated, low risk  . Cancer ([place])   . CAP (community acquired pneumonia)    Hospitalized 10/2014  . Chronic diastolic heart failure ([place])   . Chronic hypoxemic respiratory failure (HCC)    from COPD  . Chronic renal insufficiency, stage III (moderate) (HCC)    CrCl 50s  . COPD (chronic obstructive pulmonary disease) (HCC)    oxygen-dependent (2.5 L 24/7).  Improved on starting chronic prednisone 06/2017 (increased to 20 mg qd 08/2017)  Progressive worsening dyspnea through 2019: 3 L oxygen, 30mg  prednisone qd chronically as of 07/2018.  . Diabetes mellitus without complication ([place]) 11/94/1740   A1c 6.5 % 05/2016  . Diverticulosis   . Dyslipidemia   . Embolic stroke ([place])    d/t endocarditis 2008  . Hemorrhoids   .  History of mitral valve replacement    needs SBE prophylaxis  . History of prostate cancer remote past   f/u by Dr. Claris Che; no recurrence as of f/u 10/2015.  PSA rising from 2013 to 04/2018 (PSA 04/25/18=18.49): urol to rpt PSA and check CT and bone scan  approx 10/2018.  Marland Kitchen HTN (hypertension)   . Left lower lobe pulmonary nodule July/Aug 2019.   04/2018: enlarging irregular nodular opacity.  CT surgery-->PET scan: inflamm/post-infect vs malignant. Orig plan was to f/u CT chest 3 mo per CT surg, then pt was admitted again for pneumonia and rpt CT chest showed equivocal change in the region of question, pulm MD plan was to repeat CT chest again 02/2019.  . Mediastinal lymphadenopathy   . Pulmonary hypertension ([place]) 01/09/2015   secondary  . Right thyroid nodule   . Thrombocytopenia ([place])   . TIA (transient ischemic attack) 03/25/2011   ? of  (transient diplopia)  . Ulcerative colitis    left-sided/segmental, associated with diverticulosis.  Sulfasalazine per GI (Dr. Carlean Purl).  . Warthin's tumor     Past Surgical History:  Procedure Laterality Date  . carotid duplex dopplers  04/2014   No signif plaques; repeat 2 yrs per cardiology  . CATARACT EXTRACTION, BILATERAL    . COLONOSCOPY W/ BIOPSIES  12/19/2007   left-sided colitis, diverticulosis, hemorrhoids  . FLEXIBLE SIGMOIDOSCOPY  01/11/2008; 03/19/2010   2009 and 2011:segmental left colitis, diverticulosis, hemorrhoids  . INGUINAL HERNIA REPAIR     right  . MITRAL VALVE REPAIR  09/22/04   and modified Cox-Maze IV   . MITRAL VALVE REPAIR  09/14/05   redo MV repair d/t endocardiits/embolic events  . PROSTATECTOMY  06/2003   Radical  . RIGHT HEART CATHETERIZATION N/A 01/01/2015   Procedure: RIGHT HEART CATH;  Surgeon: Peter M Martinique, MD;  Location: [facility] C[facility] LAB;  Service: Cardiovascular;  Laterality: N/A;  . SALIVARY GLAND SURGERY     left resection  . TRANSTHORACIC ECHOCARDIOGRAM  11/2013; 07/2017; 08/16/18   2015: Mod LVH, EF 55-60%, no WM  abnormalities, +LA dilation, +severe RV dilation and impaired systolic fxn, +increased pulm art pressures.  Valves ok.  2018- EF 60-65%, wall motion nl, s/p MV repair, mild RV syst dysf.  07/2018 EF 50-55%, unable to eval LV diastolic function, +RV pressure overload.    Outpatient Medications Prior to Visit  Medication Sig Dispense Refill  . albuterol (PROVENTIL HFA;VENTOLIN HFA) 108 (90 Base) MCG/ACT inhaler INHALE ONE PUFF BY MOUTH EVERY 6 HOURS AS NEEDED FOR WHEEZING AND FOR SHORTNESS OF BREATH (USING 5-6 TIMES EVERY 24 HOURS) (Patient taking differently: Inhale 1 puff into the lungs every 6 (six) hours as needed for wheezing or shortness of breath. ) 1 Inhaler 5  . albuterol (PROVENTIL) (2.5 MG/3ML) 0.083% nebulizer solution Take 3 mLs (2.5 mg total) by nebulization every 4 (four) hours. And as needed 300 mL 5  . benzonatate (TESSALON) 200 MG capsule Take 1 capsule (200 mg total) by mouth 3 (three) times daily. 20 capsule 0  . budesonide (PULMICORT) 0.5 MG/2ML nebulizer solution USE 1 VIAL IN NEBULIZER TWICE DAILY 120 mL 5  . digoxin (LANOXIN) 0.125 MG tablet Take 0.5 tablets (0.0625 mg total) by mouth daily. 30 tablet 0  . diltiazem (CARDIZEM CD) 240 MG 24 hr capsule Take 1 capsule (240 mg total) by mouth daily. 90 capsule 3  . glipiZIDE (GLUCOTROL XL) 10 MG 24 hr tablet Take 1 tablet (10 mg total) by mouth daily with breakfast. 90 tablet 1  . LORazepam (ATIVAN) 1 MG tablet 1 tab po bid as needed for anxiety 60 tablet 1  . pantoprazole (PROTONIX) 40 MG tablet Take 1 tablet (40 mg total) by mouth daily. 30 tablet 3  . pravastatin (PRAVACHOL) 40 MG tablet TAKE 1 TABLET EVERY EVENING (Patient taking differently: Take 40 mg by mouth daily. TAKE 1 TABLET EVERY EVENING) 90 tablet 3  . predniSONE (DELTASONE) 20 MG tablet Take 1.5 tablets (30 mg total) by mouth daily with breakfast. 135 tablet 1  . STIOLTO RESPIMAT 2.5-2.5 MCG/ACT AERS INHALE 2 PUFFS INTO THE LUNGS DAILY. (Patient taking differently:  Inhale 2 puffs into the lungs daily. ) 12 g 3  . torsemide (DEMADEX) 20 MG tablet Take 1 tablet (20 mg total) by mouth daily. 90 tablet 0  . warfarin (COUMADIN) 5 MG tablet Take 1/2 to 1 tablet by mouth daily as directed (Patient taking differently: Take 2.5-5 mg by mouth as directed. Take 1/2 tablet (2.5 mg) on MWF & Take 1 tablet (5 mg) all other days.) 90 tablet 1  . doxycycline (VIBRA-TABS) 100 MG tablet Take 1 tablet (100 mg total) by mouth 2 (two) times daily. (Patient not taking: Reported on 09/27/2018) 20 tablet 0  . predniSONE (DELTASONE) 20 MG tablet 3 tabs po qd x 4d, then 2 tabs po qd x 4d, then resume your usual daily dose  of 30mg  once a day (Patient not taking: Reported on 09/27/2018) 20 tablet 0   No facility-administered medications prior to visit.     No Known Allergies  ROS As per HPI  PE: Blood pressure 117/67, pulse 88, temperature 98.2 F (36.8 C), temperature source Oral, resp. rate 16, height 5\' 8"  (1.727 m), weight 180 lb (81.6 kg), SpO2 91 %. Gen: Alert, chronically ill-appearing.  Patient is oriented to person, place, time, and situation. AFFECT: pleasant, lucid thought and speech. HFW:YOVZ: no injection, icteris, swelling, or exudate.  EOMI, PERRLA. Mouth: lips without lesion/swelling.  Oral mucosa pink and moist. Oropharynx without erythema, exudate, or swelling.  CV: irreg irreg, rate 80s, soft syst murmur.  No rub. Chest is clear, no wheezing or rales. Normal symmetric air entry throughout both lung fields. No chest wall deformities or tenderness. EXT: no clubbing or cyanosis.  no edema.    LABS:  Lab Results  Component Value Date   HGBA1C 8.8 (H) 07/24/2018   Lab Results  Component Value Date   INR 2.8 09/26/2018   INR 2.3 09/24/2018   INR 2.0 09/20/2018   PROTIME 18.5 03/10/2009   PROTIME 15.9 02/26/2009   PROTIME 14.2 02/20/2009      Chemistry      Component Value Date/Time   NA 140 08/27/2018 1148   K 3.7 08/27/2018 1148   CL 100 08/27/2018  1148   CO2 31 08/27/2018 1148   BUN 25 (H) 08/27/2018 1148   CREATININE 1.23 08/27/2018 1148      Component Value Date/Time   CALCIUM 9.7 08/27/2018 1148   ALKPHOS 45 05/19/2018 1700   AST 16 05/19/2018 1700   ALT 18 05/19/2018 1700   BILITOT 1.3 (H) 05/19/2018 1700     Lab Results  Component Value Date   WBC 9.7 09/18/2018   HGB 11.3 (L) 09/18/2018   HCT 34.9 (L) 09/18/2018   MCV 82.8 09/18/2018   PLT 178.0 09/18/2018   Lab Results  Component Value Date   TSH 2.438 08/18/2018   Lab Results  Component Value Date   DIGOXIN 1.3 08/16/2018    IMPRESSION AND PLAN:  Chronic hypoxic/hypercarbic resp failure (severe COPD and chronic diastolic HF): stable clinically, but significantly improved lung exam today. Volume status is stable (he is down 1 lb).  It is hard to tell if his lorazepam increase is helping his periods of increased RR/mouth breathing.  Spent time discussing possible change to ALF today and both he and his wife are NOT in favor of this at this time. His DM is still on the backburning, but sounds like the few checks they are doing lately are in low to mid 100s.  Plan recheck A1c after 10/24/18 before making any changes in DM regimen.  BMET today. No changes in mgmt today (finish pred burst then resume usual 30mg  qd prednisone dosing), continue torsemide daily). F/u 1 wk.  Spent 25 min with pt today, with >50% of this time spent in counseling and care coordination regarding the above problems.  An After Visit Summary was printed and given to the patient.  FOLLOW UP: Return in about 1 week (around 10/04/2018) for routine chronic illness f/u.  Signed:  Crissie Sickles, MD           09/27/2018

## 2018-10-01 ENCOUNTER — Ambulatory Visit (INDEPENDENT_AMBULATORY_CARE_PROVIDER_SITE_OTHER): Payer: Medicare Other | Admitting: Interventional Cardiology

## 2018-10-01 DIAGNOSIS — I4891 Unspecified atrial fibrillation: Secondary | ICD-10-CM

## 2018-10-01 DIAGNOSIS — Z5181 Encounter for therapeutic drug level monitoring: Secondary | ICD-10-CM | POA: Diagnosis not present

## 2018-10-01 DIAGNOSIS — J439 Emphysema, unspecified: Secondary | ICD-10-CM | POA: Diagnosis not present

## 2018-10-01 DIAGNOSIS — J181 Lobar pneumonia, unspecified organism: Secondary | ICD-10-CM | POA: Diagnosis not present

## 2018-10-01 DIAGNOSIS — E1122 Type 2 diabetes mellitus with diabetic chronic kidney disease: Secondary | ICD-10-CM | POA: Diagnosis not present

## 2018-10-01 DIAGNOSIS — I5033 Acute on chronic diastolic (congestive) heart failure: Secondary | ICD-10-CM | POA: Diagnosis not present

## 2018-10-01 DIAGNOSIS — I2510 Atherosclerotic heart disease of native coronary artery without angina pectoris: Secondary | ICD-10-CM | POA: Diagnosis not present

## 2018-10-01 DIAGNOSIS — I130 Hypertensive heart and chronic kidney disease with heart failure and stage 1 through stage 4 chronic kidney disease, or unspecified chronic kidney disease: Secondary | ICD-10-CM | POA: Diagnosis not present

## 2018-10-01 LAB — POCT INR: INR: 1.9 — AB (ref 2.0–3.0)

## 2018-10-01 NOTE — Patient Instructions (Signed)
Description   Spoke with Morey Hummingbird, [facility] advised to have pt to take another 1/2 tablet today then resume taking 1/2 tablet daily except 1 tablet on Sundays, Tuesdays and Thursdays.  Prednisone 30 mg daily maintenance dose. Recheck INR in 1 week. Call with new medications or any changes [phone].

## 2018-10-03 DIAGNOSIS — J439 Emphysema, unspecified: Secondary | ICD-10-CM | POA: Diagnosis not present

## 2018-10-03 DIAGNOSIS — I130 Hypertensive heart and chronic kidney disease with heart failure and stage 1 through stage 4 chronic kidney disease, or unspecified chronic kidney disease: Secondary | ICD-10-CM | POA: Diagnosis not present

## 2018-10-03 DIAGNOSIS — I5033 Acute on chronic diastolic (congestive) heart failure: Secondary | ICD-10-CM | POA: Diagnosis not present

## 2018-10-03 DIAGNOSIS — E1122 Type 2 diabetes mellitus with diabetic chronic kidney disease: Secondary | ICD-10-CM | POA: Diagnosis not present

## 2018-10-03 DIAGNOSIS — I2510 Atherosclerotic heart disease of native coronary artery without angina pectoris: Secondary | ICD-10-CM | POA: Diagnosis not present

## 2018-10-03 DIAGNOSIS — J181 Lobar pneumonia, unspecified organism: Secondary | ICD-10-CM | POA: Diagnosis not present

## 2018-10-04 ENCOUNTER — Encounter: Payer: Self-pay | Admitting: Family Medicine

## 2018-10-04 ENCOUNTER — Ambulatory Visit (INDEPENDENT_AMBULATORY_CARE_PROVIDER_SITE_OTHER): Payer: Medicare Other | Admitting: Family Medicine

## 2018-10-04 VITALS — BP 105/62 | HR 101 | Temp 97.5°F | Resp 16 | Ht 68.0 in | Wt 178.4 lb

## 2018-10-04 DIAGNOSIS — J9621 Acute and chronic respiratory failure with hypoxia: Secondary | ICD-10-CM

## 2018-10-04 DIAGNOSIS — H6121 Impacted cerumen, right ear: Secondary | ICD-10-CM

## 2018-10-04 DIAGNOSIS — J449 Chronic obstructive pulmonary disease, unspecified: Secondary | ICD-10-CM

## 2018-10-04 DIAGNOSIS — I5032 Chronic diastolic (congestive) heart failure: Secondary | ICD-10-CM

## 2018-10-04 DIAGNOSIS — E663 Overweight: Secondary | ICD-10-CM

## 2018-10-04 DIAGNOSIS — J9622 Acute and chronic respiratory failure with hypercapnia: Secondary | ICD-10-CM | POA: Diagnosis not present

## 2018-10-04 NOTE — Progress Notes (Signed)
OFFICE VISIT  10/04/2018   CC:  Chief Complaint  Patient presents with  . Follow-up    RCI, pt is not fasting.     HPI:    Patient is a 83 y.o. Caucasian male who presents accompanied by his wife for 1 week f/u chronic hypoxic/hypercarbic resp failure (severe COPD and chronic diastolic HF). A/P as of last visit: "Improved COPD and I felt like volume status was fine. BMET done (stable). No changes in mgmt (finish pred burst then resume usual 30mg  qd prednisone dosing), continue torsemide daily). F/u 1 wk."  Interim hx: No changes.  Says resp status is at his baseline. He is getting home PT 2 times per week and pt says this is really good.  Nurse goes to home once a week as well. Oxygen still 88-95% on 3L portable (4L home). Takes lorazepam but not regularly.  It seems to help with nerves and keeps him from getting really emotional. NO adverse effects from this med. He did not take his torsemide yesterday or the day prior.  He plans on taking it later today, though. He says he felt like he didn't need it b/c he had lost a pound.  Says he feels like R ear is stuffed with wax.  No pain. No fevers.  No CP.  No dizziness.    Past Medical History:  Diagnosis Date  . Adrenal adenoma   . Atrial fibrillation (HCC)    Rate control + Coumadin  . CAD (coronary artery disease)    Lexiscan Myoview (01/2014): Lateral soft tissue attenuation, no ischemia, not gated, low risk  . Cancer ([place])   . CAP (community acquired pneumonia)    Hospitalized 10/2014  . Chronic diastolic heart failure ([place])   . Chronic hypoxemic respiratory failure (HCC)    from COPD  . Chronic renal insufficiency, stage III (moderate) (HCC)    CrCl 50s  . COPD (chronic obstructive pulmonary disease) (HCC)    oxygen-dependent (2.5 L 24/7).  Improved on starting chronic prednisone 06/2017 (increased to 20 mg qd 08/2017)  Progressive worsening dyspnea through 2019: 3 L oxygen, 30mg  prednisone qd chronically as of  07/2018.  . Diabetes mellitus without complication ([place]) 67/20/9470   A1c 6.5 % 05/2016  . Diverticulosis   . Dyslipidemia   . Embolic stroke ([place])    d/t endocarditis 2008  . Hemorrhoids   . History of mitral valve replacement    needs SBE prophylaxis  . History of prostate cancer remote past   f/u by Dr. Claris Che; no recurrence as of f/u 10/2015.  PSA rising from 2013 to 04/2018 (PSA 04/25/18=18.49): urol to rpt PSA and check CT and bone scan  approx 10/2018.  Marland Kitchen HTN (hypertension)   . Left lower lobe pulmonary nodule July/Aug 2019.   04/2018: enlarging irregular nodular opacity.  CT surgery-->PET scan: inflamm/post-infect vs malignant. Orig plan was to f/u CT chest 3 mo per CT surg, then pt was admitted again for pneumonia and rpt CT chest showed equivocal change in the region of question, pulm MD plan was to repeat CT chest again 02/2019.  . Mediastinal lymphadenopathy   . Pulmonary hypertension ([place]) 01/09/2015   secondary  . Right thyroid nodule   . Thrombocytopenia ([place])   . TIA (transient ischemic attack) 03/25/2011   ? of  (transient diplopia)  . Ulcerative colitis    left-sided/segmental, associated with diverticulosis.  Sulfasalazine per GI (Dr. Carlean Purl).  . Warthin's tumor     Past Surgical History:  Procedure  Laterality Date  . carotid duplex dopplers  04/2014   No signif plaques; repeat 2 yrs per cardiology  . CATARACT EXTRACTION, BILATERAL    . COLONOSCOPY W/ BIOPSIES  12/19/2007   left-sided colitis, diverticulosis, hemorrhoids  . FLEXIBLE SIGMOIDOSCOPY  01/11/2008; 03/19/2010   2009 and 2011:segmental left colitis, diverticulosis, hemorrhoids  . INGUINAL HERNIA REPAIR     right  . MITRAL VALVE REPAIR  09/22/04   and modified Cox-Maze IV   . MITRAL VALVE REPAIR  09/14/05   redo MV repair d/t endocardiits/embolic events  . PROSTATECTOMY  06/2003   Radical  . RIGHT HEART CATHETERIZATION N/A 01/01/2015   Procedure: RIGHT HEART CATH;  Surgeon: Peter M Martinique, MD;  Location: [facility]  CATH LAB;  Service: Cardiovascular;  Laterality: N/A;  . SALIVARY GLAND SURGERY     left resection  . TRANSTHORACIC ECHOCARDIOGRAM  11/2013; 07/2017; 08/16/18   2015: Mod LVH, EF 55-60%, no WM abnormalities, +LA dilation, +severe RV dilation and impaired systolic fxn, +increased pulm art pressures.  Valves ok.  2018- EF 60-65%, wall motion nl, s/p MV repair, mild RV syst dysf.  07/2018 EF 50-55%, unable to eval LV diastolic function, +RV pressure overload.    Outpatient Medications Prior to Visit  Medication Sig Dispense Refill  . albuterol (PROVENTIL HFA;VENTOLIN HFA) 108 (90 Base) MCG/ACT inhaler INHALE ONE PUFF BY MOUTH EVERY 6 HOURS AS NEEDED FOR WHEEZING AND FOR SHORTNESS OF BREATH (USING 5-6 TIMES EVERY 24 HOURS) (Patient taking differently: Inhale 1 puff into the lungs every 6 (six) hours as needed for wheezing or shortness of breath. ) 1 Inhaler 5  . albuterol (PROVENTIL) (2.5 MG/3ML) 0.083% nebulizer solution Take 3 mLs (2.5 mg total) by nebulization every 4 (four) hours. And as needed 300 mL 5  . benzonatate (TESSALON) 200 MG capsule Take 1 capsule (200 mg total) by mouth 3 (three) times daily. 20 capsule 0  . budesonide (PULMICORT) 0.5 MG/2ML nebulizer solution USE 1 VIAL IN NEBULIZER TWICE DAILY 120 mL 5  . digoxin (LANOXIN) 0.125 MG tablet Take 0.5 tablets (0.0625 mg total) by mouth daily. 30 tablet 0  . diltiazem (CARDIZEM CD) 240 MG 24 hr capsule Take 1 capsule (240 mg total) by mouth daily. 90 capsule 3  . glipiZIDE (GLUCOTROL XL) 10 MG 24 hr tablet Take 1 tablet (10 mg total) by mouth daily with breakfast. 90 tablet 1  . LORazepam (ATIVAN) 1 MG tablet 1 tab po bid as needed for anxiety 60 tablet 1  . pantoprazole (PROTONIX) 40 MG tablet Take 1 tablet (40 mg total) by mouth daily. 30 tablet 3  . pravastatin (PRAVACHOL) 40 MG tablet TAKE 1 TABLET EVERY EVENING (Patient taking differently: Take 40 mg by mouth daily. TAKE 1 TABLET EVERY EVENING) 90 tablet 3  . predniSONE (DELTASONE) 20  MG tablet Take 1.5 tablets (30 mg total) by mouth daily with breakfast. 135 tablet 1  . STIOLTO RESPIMAT 2.5-2.5 MCG/ACT AERS INHALE 2 PUFFS INTO THE LUNGS DAILY. (Patient taking differently: Inhale 2 puffs into the lungs daily. ) 12 g 3  . torsemide (DEMADEX) 20 MG tablet Take 1 tablet (20 mg total) by mouth daily. 90 tablet 0  . warfarin (COUMADIN) 5 MG tablet Take 1/2 to 1 tablet by mouth daily as directed (Patient taking differently: Take 2.5-5 mg by mouth as directed. Take 1/2 tablet (2.5 mg) on MWF & Take 1 tablet (5 mg) all other days.) 90 tablet 1   No facility-administered medications prior to visit.  No Known Allergies  ROS As per HPI  PE: Blood pressure 105/62, pulse (!) 101, temperature (!) 97.5 F (36.4 C), temperature source Oral, resp. rate 16, height 5\' 8"  (1.727 m), weight 178 lb 6 oz (80.9 kg), SpO2 (!) 88 %. Body mass index is 27.12 kg/m.  Gen: Alert, chronically ill-appearing, sitting in WC with nasal cannulae on.  Patient is oriented to person, place, time, and situation. ENT: R ear 100% cerumen impaction. CV: irreg irreg, rate approx 90/min, no m/r LUNGS: soft early inspiratory crackles in L base>R base, no wheezing.  Exp phase is not prolonged.   EXT: no clubbing or cyanosis.  no edema.     LABS:    Chemistry      Component Value Date/Time   NA 138 09/27/2018 1159   K 4.0 09/27/2018 1159   CL 101 09/27/2018 1159   CO2 25 09/27/2018 1159   BUN 24 (H) 09/27/2018 1159   CREATININE 1.17 09/27/2018 1159      Component Value Date/Time   CALCIUM 9.3 09/27/2018 1159   ALKPHOS 45 05/19/2018 1700   AST 16 05/19/2018 1700   ALT 18 05/19/2018 1700   BILITOT 1.3 (H) 05/19/2018 1700     Lab Results  Component Value Date   INR 1.9 (A) 10/01/2018   INR 2.8 09/26/2018   INR 2.3 09/24/2018   PROTIME 18.5 03/10/2009   PROTIME 15.9 02/26/2009   PROTIME 14.2 02/20/2009   Lab Results  Component Value Date   WBC 9.7 09/18/2018   HGB 11.3 (L) 09/18/2018    HCT 34.9 (L) 09/18/2018   MCV 82.8 09/18/2018   PLT 178.0 09/18/2018    IMPRESSION AND PLAN:  1) Stable chronic hypoxic/hypercarbic RF-->severe COPD and chronic diastolic HF. He seems stable at this time. Encouraged pt to take his torsemide every day and encouraged pt to take his lorazepam bid SCHEDULED b/c it is helping him. No changes in med regimen today, no labs.  2) R ear cerumen impaction-->100%. He declined trial of irrigation today b/c he said it irritated his ear canal too much when we did it in the past on L ear. He declined ENT referral at this time. He'll try otc debrox to dissolve/loosen it up some and we'll reassess in 3 wks.  An After Visit Summary was printed and given to the patient.  FOLLOW UP: Return in about 3 weeks (around 10/27/2018) for f/u R ear cerum impaction + resp status (30 min).  Signed:  Crissie Sickles, MD           10/04/2018

## 2018-10-04 NOTE — Patient Instructions (Signed)
Get over the counter drops for your ear called "Debrox"--there is probably a generic. Use as directed on the packaging to loosen up/dissolve ear wax in your right ear.

## 2018-10-08 ENCOUNTER — Ambulatory Visit: Payer: Self-pay | Admitting: *Deleted

## 2018-10-08 ENCOUNTER — Ambulatory Visit (INDEPENDENT_AMBULATORY_CARE_PROVIDER_SITE_OTHER): Payer: Medicare Other | Admitting: Cardiology

## 2018-10-08 DIAGNOSIS — I130 Hypertensive heart and chronic kidney disease with heart failure and stage 1 through stage 4 chronic kidney disease, or unspecified chronic kidney disease: Secondary | ICD-10-CM | POA: Diagnosis not present

## 2018-10-08 DIAGNOSIS — J439 Emphysema, unspecified: Secondary | ICD-10-CM | POA: Diagnosis not present

## 2018-10-08 DIAGNOSIS — I5033 Acute on chronic diastolic (congestive) heart failure: Secondary | ICD-10-CM | POA: Diagnosis not present

## 2018-10-08 DIAGNOSIS — Z5181 Encounter for therapeutic drug level monitoring: Secondary | ICD-10-CM

## 2018-10-08 DIAGNOSIS — E1122 Type 2 diabetes mellitus with diabetic chronic kidney disease: Secondary | ICD-10-CM | POA: Diagnosis not present

## 2018-10-08 DIAGNOSIS — I2510 Atherosclerotic heart disease of native coronary artery without angina pectoris: Secondary | ICD-10-CM | POA: Diagnosis not present

## 2018-10-08 DIAGNOSIS — I4891 Unspecified atrial fibrillation: Secondary | ICD-10-CM | POA: Diagnosis not present

## 2018-10-08 DIAGNOSIS — J181 Lobar pneumonia, unspecified organism: Secondary | ICD-10-CM | POA: Diagnosis not present

## 2018-10-08 LAB — POCT INR: INR: 2.4 (ref 2.0–3.0)

## 2018-10-08 NOTE — Telephone Encounter (Signed)
Noted  

## 2018-10-08 NOTE — Telephone Encounter (Signed)
FYI

## 2018-10-08 NOTE — Telephone Encounter (Signed)
'  [place] Ar' PTA from [facility] reports pt with 2-3+ bilateral feet, ankle edema., pitting. States does not extend up legs. Reports pt did not take his Demadex over the weekend, has not taken yet today. Reports "Bad weekend, went out to eat and SOB worsened." States SOB has resolved, at baseline. PTA reports urine dark, voiding small amounts. States taking sips of water "Which is usual for him, doesn't drink much." Initially called  questioning if pt should "Double up on dose of Demadex."  TN advised pt should be seen, pt declines appt. Pt has [place] coming in this AM as well. Advised PTA to have RN call if additional assessment necessary.  Please advise:    [phone]     [phone]   Reason for Disposition . [1] MODERATE leg swelling (e.g., swelling extends up to knees) AND [2] new onset or worsening    Does not extend but reports 2-3+. HAs not taken Demadex  Answer Assessment - Initial Assessment Questions 1. ONSET: "When did the swelling start?" (e.g., minutes, hours, days)     Saturday 2. LOCATION: "What part of the leg is swollen?"  "Are both legs swollen or just one leg?"     Both feet, ankles, does not extend 3. SEVERITY: "How bad is the swelling?" (e.g., localized; mild, moderate, severe)  - Localized - small area of swelling localized to one leg  - MILD pedal edema - swelling limited to foot and ankle, pitting edema < 1/4 inch (6 mm) deep, rest and elevation eliminate most or all swelling  - MODERATE edema - swelling of lower leg to knee, pitting edema > 1/4 inch (6 mm) deep, rest and elevation only partially reduce swelling  - SEVERE edema - swelling extends above knee, facial or hand swelling present      2-3+ edema per PTAs report 4. REDNESS: "Does the swelling look red or infected?"     no 5. PAIN: "Is the swelling painful to touch?" If so, ask: "How painful is it?"   (Scale 1-10; mild, moderate or severe)     no 6. FEVER: "Do you have a fever?" If so, ask: "What is it,  how was it measured, and when did it start?"      no 7. CAUSE: "What do you think is causing the leg swelling?"     Did not take torsemide x 3 days 8. MEDICAL HISTORY: "Do you have a history of heart failure, kidney disease, liver failure, or cancer?"     yes 9. RECURRENT SYMPTOM: "Have you had leg swelling before?" If so, ask: "When was the last time?" "What happened that time?"      10. OTHER SYMPTOMS: "Do you have any other symptoms?" (e.g., chest pain, difficulty breathing)       Urine dark, voiding small amounts. Denies pain, burning. States is staying hydrates nut only "Sips which he usually does."  Protocols used: LEG SWELLING AND EDEMA-A-AH

## 2018-10-09 NOTE — Progress Notes (Signed)
HPI: FU MV repair, AFib/flutter, s/p MAZE procedure, nonobstructive CAD, prior embolic CVA after initial MV repair secondary to endocarditis requiring redo surgery (repair), diastolic CHF, COPD, HL, HTN.Holter monitor June 2016 showed atrial fibrillation rate controlled. Right heart catheterization April 2016 showed a right atrial pressure of 8, PA pressure of 59/23 and pulmonary Wedge pressure of 18. Findings consistent with moderate pulmonary hypertension and upper normal left ventricular filling pressures.Carotid Dopplers October 2017 showed1-39 bilateral stenosis.Followed by pulmonary because of COPD. Chest CT August 2019 showed no pulmonary embolus.  PET scan August 2019 showed masslike area of consolidation posterior left lower lobe and follow-up recommended 3 months. 4 mm right upper lobe nodule and noncontrast suggested 12 months.  Also with emphysema and 3.4 cm infrarenal abdominal aortic aneurysm.  Patient felt to be too high risk for surgical intervention or biopsy of lung mass.  Now followed by pulmonary.  Admitted with acute on chronic diastolic congestive heart failure December 2019.  Treated with IV diuretics.  Also treated for COPD and possible pneumonia.  Echocardiogram November 2019 showed normal LV function, trace aortic insufficiency, prior mitral valve repair with valve area by pressure half-time 1.6 cm, severe left atrial enlargement, right atrial and right ventricular enlargement.  Since last seen,he continues to have severe dyspnea on exertion but none at rest.  No orthopnea, PND, pedal edema, palpitations, chest pain or syncope.  Current Outpatient Medications  Medication Sig Dispense Refill  . albuterol (PROVENTIL HFA;VENTOLIN HFA) 108 (90 Base) MCG/ACT inhaler INHALE ONE PUFF BY MOUTH EVERY 6 HOURS AS NEEDED FOR WHEEZING AND FOR SHORTNESS OF BREATH (USING 5-6 TIMES EVERY 24 HOURS) (Patient taking differently: Inhale 1 puff into the lungs every 6 (six) hours as needed  for wheezing or shortness of breath. ) 1 Inhaler 5  . albuterol (PROVENTIL) (2.5 MG/3ML) 0.083% nebulizer solution Take 3 mLs (2.5 mg total) by nebulization every 4 (four) hours. And as needed 300 mL 5  . benzonatate (TESSALON) 200 MG capsule Take 1 capsule (200 mg total) by mouth 3 (three) times daily. 20 capsule 0  . budesonide (PULMICORT) 0.5 MG/2ML nebulizer solution USE 1 VIAL IN NEBULIZER TWICE DAILY 120 mL 5  . digoxin (LANOXIN) 0.125 MG tablet Take 0.5 tablets (0.0625 mg total) by mouth daily. 30 tablet 0  . diltiazem (CARDIZEM CD) 240 MG 24 hr capsule Take 1 capsule (240 mg total) by mouth daily. 90 capsule 3  . glipiZIDE (GLUCOTROL XL) 10 MG 24 hr tablet Take 1 tablet (10 mg total) by mouth daily with breakfast. 90 tablet 1  . LORazepam (ATIVAN) 1 MG tablet 1 tab po bid as needed for anxiety 60 tablet 1  . pantoprazole (PROTONIX) 40 MG tablet Take 1 tablet (40 mg total) by mouth daily. 30 tablet 3  . pravastatin (PRAVACHOL) 40 MG tablet TAKE 1 TABLET EVERY EVENING (Patient taking differently: Take 40 mg by mouth daily. TAKE 1 TABLET EVERY EVENING) 90 tablet 3  . predniSONE (DELTASONE) 20 MG tablet Take 1.5 tablets (30 mg total) by mouth daily with breakfast. 135 tablet 1  . STIOLTO RESPIMAT 2.5-2.5 MCG/ACT AERS INHALE 2 PUFFS INTO THE LUNGS DAILY. (Patient taking differently: Inhale 2 puffs into the lungs daily. ) 12 g 3  . torsemide (DEMADEX) 20 MG tablet Take 1 tablet (20 mg total) by mouth daily. 90 tablet 0  . warfarin (COUMADIN) 5 MG tablet Take 1/2 to 1 tablet by mouth daily as directed (Patient taking differently: Take 2.5-5 mg by  mouth as directed. Take 1/2 tablet (2.5 mg) on MWF & Take 1 tablet (5 mg) all other days.) 90 tablet 1   No current facility-administered medications for this visit.      Past Medical History:  Diagnosis Date  . Adrenal adenoma   . Atrial fibrillation (HCC)    Rate control + Coumadin  . CAD (coronary artery disease)    Lexiscan Myoview (01/2014):  Lateral soft tissue attenuation, no ischemia, not gated, low risk  . Cancer ([place])   . CAP (community acquired pneumonia)    Hospitalized 10/2014  . Chronic diastolic heart failure ([place])   . Chronic hypoxemic respiratory failure (HCC)    from COPD  . Chronic renal insufficiency, stage III (moderate) (HCC)    CrCl 50s  . COPD (chronic obstructive pulmonary disease) (HCC)    oxygen-dependent (2.5 L 24/7).  Improved on starting chronic prednisone 06/2017 (increased to 20 mg qd 08/2017)  Progressive worsening dyspnea through 2019: 3 L oxygen, 30mg  prednisone qd chronically as of 07/2018.  . Diabetes mellitus without complication ([place]) 37/12/8887   A1c 6.5 % 05/2016  . Diverticulosis   . Dyslipidemia   . Embolic stroke ([place])    d/t endocarditis 2008  . Hemorrhoids   . History of mitral valve replacement    needs SBE prophylaxis  . History of prostate cancer remote past   f/u by Dr. Claris Che; no recurrence as of f/u 10/2015.  PSA rising from 2013 to 04/2018 (PSA 04/25/18=18.49): urol to rpt PSA and check CT and bone scan  approx 10/2018.  Marland Kitchen HTN (hypertension)   . Left lower lobe pulmonary nodule July/Aug 2019.   04/2018: enlarging irregular nodular opacity.  CT surgery-->PET scan: inflamm/post-infect vs malignant. Orig plan was to f/u CT chest 3 mo per CT surg, then pt was admitted again for pneumonia and rpt CT chest showed equivocal change in the region of question, pulm MD plan was to repeat CT chest again 02/2019.  . Mediastinal lymphadenopathy   . Pulmonary hypertension ([place]) 01/09/2015   secondary  . Right thyroid nodule   . Thrombocytopenia ([place])   . TIA (transient ischemic attack) 03/25/2011   ? of  (transient diplopia)  . Ulcerative colitis    left-sided/segmental, associated with diverticulosis.  Sulfasalazine per GI (Dr. Carlean Purl).  . Warthin's tumor     Past Surgical History:  Procedure Laterality Date  . carotid duplex dopplers  04/2014   No signif plaques; repeat 2 yrs per  cardiology  . CATARACT EXTRACTION, BILATERAL    . COLONOSCOPY W/ BIOPSIES  12/19/2007   left-sided colitis, diverticulosis, hemorrhoids  . FLEXIBLE SIGMOIDOSCOPY  01/11/2008; 03/19/2010   2009 and 2011:segmental left colitis, diverticulosis, hemorrhoids  . INGUINAL HERNIA REPAIR     right  . MITRAL VALVE REPAIR  09/22/04   and modified Cox-Maze IV   . MITRAL VALVE REPAIR  09/14/05   redo MV repair d/t endocardiits/embolic events  . PROSTATECTOMY  06/2003   Radical  . RIGHT HEART CATHETERIZATION N/A 01/01/2015   Procedure: RIGHT HEART CATH;  Surgeon: Peter M Martinique, MD;  Location: [facility] CATH LAB;  Service: Cardiovascular;  Laterality: N/A;  . SALIVARY GLAND SURGERY     left resection  . TRANSTHORACIC ECHOCARDIOGRAM  11/2013; 07/2017; 08/16/18   2015: Mod LVH, EF 55-60%, no WM abnormalities, +LA dilation, +severe RV dilation and impaired systolic fxn, +increased pulm art pressures.  Valves ok.  2018- EF 60-65%, wall motion nl, s/p MV repair, mild RV syst dysf.  07/2018  EF 50-55%, unable to eval LV diastolic function, +RV pressure overload.    Social History   Socioeconomic History  . Marital status: Married    Spouse name: Jeanett Schlein  . Number of children: 2  . Years of education: Not on file  . Highest education level: Not on file  Occupational History  . Occupation: RETIRED    Comment: Careers adviser: RETIRED  Social Needs  . Financial resource strain: Not on file  . Food insecurity:    Worry: Not on file    Inability: Not on file  . Transportation needs:    Medical: Not on file    Non-medical: Not on file  Tobacco Use  . Smoking status: Former Smoker    Packs/day: 3.00    Years: 60.00    Pack years: 180.00    Types: Cigarettes    Last attempt to quit: 09/19/2002    Years since quitting: 16.0  . Smokeless tobacco: Never Used  Substance and Sexual Activity  . Alcohol use: No    Alcohol/week: 0.0 standard drinks  . Drug use: No  . Sexual activity: Not on file  Lifestyle  .  Physical activity:    Days per week: Not on file    Minutes per session: Not on file  . Stress: Not on file  Relationships  . Social connections:    Talks on phone: Not on file    Gets together: Not on file    Attends religious service: Not on file    Active member of club or organization: Not on file    Attends meetings of clubs or organizations: Not on file    Relationship status: Not on file  . Intimate partner violence:    Fear of current or ex partner: Not on file    Emotionally abused: Not on file    Physically abused: Not on file    Forced sexual activity: Not on file  Other Topics Concern  . Not on file  Social History Narrative   Married, 2 children.   Retired Systems developer from [employer]   180 pack-year tob hx.  No alcohol.    Family History  Problem Relation Age of Onset  . Cancer Mother        spinal  . Stroke Father   . Heart disease Father   . Colon cancer Neg Hx   . Heart attack Neg Hx     ROS: no fevers or chills, productive cough, hemoptysis, dysphasia, odynophagia, melena, hematochezia, dysuria, hematuria, rash, seizure activity, orthopnea, PND, pedal edema, claudication. Remaining systems are negative.  Physical Exam: Well-developed well-nourished chronically ill-appearing in no acute distress.  Skin is warm and dry.  HEENT is normal.  Neck is supple.  Chest diminished breath sounds throughout Cardiovascular exam is regular rate and rhythm.  Abdominal exam nontender or distended. No masses palpated. Extremities show no edema. neuro grossly intact  A/P  1 atrial fibrillation-plan to continue Cardizem and digoxin for rate control.  Continue Coumadin with goal INR 2-3.  2 coronary artery disease-plan to continue statin.  No aspirin given need for anticoagulation.  3 hypertension-blood pressure is controlled.  Continue present medications and follow.  4 hyperlipidemia-continue statin.  5 prior mitral valve repair-continue SBE prophylaxis.   Patient not a candidate for redo surgery in the future.  Continue present dose of Lasix for volume excess.  6 carotid artery disease-mild on most recent Dopplers.  7 dyspnea-this is felt secondary to COPD predominantly with  contributions from obesity hypoventilation syndrome and mitral valve disease.  8 chronic diastolic congestive heart failure-continue present dose of Lasix.  Continue fluid restriction and low-sodium diet.  Not volume overloaded on examination.  9 lung mass-patient is followed by pulmonary.  10 history of abdominal aortic aneurysm-I do not think he will be a candidate for repair in the future.  Kirk Ruths, MD

## 2018-10-10 DIAGNOSIS — I130 Hypertensive heart and chronic kidney disease with heart failure and stage 1 through stage 4 chronic kidney disease, or unspecified chronic kidney disease: Secondary | ICD-10-CM | POA: Diagnosis not present

## 2018-10-10 DIAGNOSIS — J439 Emphysema, unspecified: Secondary | ICD-10-CM | POA: Diagnosis not present

## 2018-10-10 DIAGNOSIS — E1122 Type 2 diabetes mellitus with diabetic chronic kidney disease: Secondary | ICD-10-CM | POA: Diagnosis not present

## 2018-10-10 DIAGNOSIS — I2510 Atherosclerotic heart disease of native coronary artery without angina pectoris: Secondary | ICD-10-CM | POA: Diagnosis not present

## 2018-10-10 DIAGNOSIS — J181 Lobar pneumonia, unspecified organism: Secondary | ICD-10-CM | POA: Diagnosis not present

## 2018-10-10 DIAGNOSIS — I5033 Acute on chronic diastolic (congestive) heart failure: Secondary | ICD-10-CM | POA: Diagnosis not present

## 2018-10-12 ENCOUNTER — Encounter: Payer: Self-pay | Admitting: Cardiology

## 2018-10-12 ENCOUNTER — Ambulatory Visit (INDEPENDENT_AMBULATORY_CARE_PROVIDER_SITE_OTHER): Payer: Medicare Other | Admitting: Cardiology

## 2018-10-12 VITALS — BP 122/60 | HR 80 | Ht 68.0 in | Wt 178.0 lb

## 2018-10-12 DIAGNOSIS — I2510 Atherosclerotic heart disease of native coronary artery without angina pectoris: Secondary | ICD-10-CM

## 2018-10-12 DIAGNOSIS — E7800 Pure hypercholesterolemia, unspecified: Secondary | ICD-10-CM | POA: Diagnosis not present

## 2018-10-12 DIAGNOSIS — I4821 Permanent atrial fibrillation: Secondary | ICD-10-CM | POA: Diagnosis not present

## 2018-10-12 DIAGNOSIS — I10 Essential (primary) hypertension: Secondary | ICD-10-CM | POA: Diagnosis not present

## 2018-10-12 NOTE — Patient Instructions (Signed)
Medication Instructions:  NO CHANGE If you need a refill on your cardiac medications before your next appointment, please call your pharmacy.   Lab work: If you have labs (blood work) drawn today and your tests are completely normal, you will receive your results only by: Marland Kitchen MyChart Message (if you have MyChart) OR . A paper copy in the mail If you have any lab test that is abnormal or we need to change your treatment, we will call you to review the results.  Follow-Up: At [facility], you and your health needs are our priority.  As part of our continuing mission to provide you with exceptional heart care, we have created designated Provider Care Teams.  These Care Teams include your primary Cardiologist (physician) and Advanced Practice Providers (APPs -  Physician Assistants and Nurse Practitioners) who all work together to provide you with the care you need, when you need it. You will need a follow up appointment in 6 months.  Please call our office 2 months in advance to schedule this appointment.  You may see Kirk Ruths, MD or one of the following Advanced Practice Providers on your designated Care Team:   Kerin Ransom, PA-C Roby Lofts, [place] . Sande Rives, PA-C  CALL IN [facility] TO SCHEDULE APPOINTMENT IN [place]

## 2018-10-15 DIAGNOSIS — I130 Hypertensive heart and chronic kidney disease with heart failure and stage 1 through stage 4 chronic kidney disease, or unspecified chronic kidney disease: Secondary | ICD-10-CM | POA: Diagnosis not present

## 2018-10-15 DIAGNOSIS — E1122 Type 2 diabetes mellitus with diabetic chronic kidney disease: Secondary | ICD-10-CM | POA: Diagnosis not present

## 2018-10-15 DIAGNOSIS — I5033 Acute on chronic diastolic (congestive) heart failure: Secondary | ICD-10-CM | POA: Diagnosis not present

## 2018-10-15 DIAGNOSIS — I2510 Atherosclerotic heart disease of native coronary artery without angina pectoris: Secondary | ICD-10-CM | POA: Diagnosis not present

## 2018-10-15 DIAGNOSIS — J439 Emphysema, unspecified: Secondary | ICD-10-CM | POA: Diagnosis not present

## 2018-10-15 DIAGNOSIS — J181 Lobar pneumonia, unspecified organism: Secondary | ICD-10-CM | POA: Diagnosis not present

## 2018-10-16 ENCOUNTER — Telehealth: Payer: Self-pay | Admitting: Family Medicine

## 2018-10-16 NOTE — Telephone Encounter (Signed)
Please advise. Thanks.  

## 2018-10-16 NOTE — Telephone Encounter (Signed)
Copied from [place] [id number]. Topic: Quick Communication - Home Health Verbal Orders >> Oct 16, 2018 12:13 PM Bea Graff, NT wrote: Caller/Agency: [facility] Number: [phone] Requesting OT/PT/Skilled Nursing/Social Work: PT  Frequency: Conitnue care for 2 times a week for 4 weeks

## 2018-10-17 ENCOUNTER — Ambulatory Visit (INDEPENDENT_AMBULATORY_CARE_PROVIDER_SITE_OTHER): Payer: Medicare Other | Admitting: Internal Medicine

## 2018-10-17 DIAGNOSIS — I4821 Permanent atrial fibrillation: Secondary | ICD-10-CM

## 2018-10-17 DIAGNOSIS — Z5181 Encounter for therapeutic drug level monitoring: Secondary | ICD-10-CM

## 2018-10-17 DIAGNOSIS — I5033 Acute on chronic diastolic (congestive) heart failure: Secondary | ICD-10-CM | POA: Diagnosis not present

## 2018-10-17 DIAGNOSIS — E1122 Type 2 diabetes mellitus with diabetic chronic kidney disease: Secondary | ICD-10-CM | POA: Diagnosis not present

## 2018-10-17 DIAGNOSIS — J181 Lobar pneumonia, unspecified organism: Secondary | ICD-10-CM | POA: Diagnosis not present

## 2018-10-17 DIAGNOSIS — I130 Hypertensive heart and chronic kidney disease with heart failure and stage 1 through stage 4 chronic kidney disease, or unspecified chronic kidney disease: Secondary | ICD-10-CM | POA: Diagnosis not present

## 2018-10-17 DIAGNOSIS — J439 Emphysema, unspecified: Secondary | ICD-10-CM | POA: Diagnosis not present

## 2018-10-17 DIAGNOSIS — I2510 Atherosclerotic heart disease of native coronary artery without angina pectoris: Secondary | ICD-10-CM | POA: Diagnosis not present

## 2018-10-17 LAB — POCT INR: INR: 2.6 (ref 2.0–3.0)

## 2018-10-17 NOTE — Telephone Encounter (Signed)
Yes, this is fine.

## 2018-10-17 NOTE — Telephone Encounter (Signed)
Jim advised and voiced understanding. 

## 2018-10-18 ENCOUNTER — Telehealth: Payer: Self-pay | Admitting: Family Medicine

## 2018-10-18 DIAGNOSIS — J181 Lobar pneumonia, unspecified organism: Secondary | ICD-10-CM | POA: Diagnosis not present

## 2018-10-18 DIAGNOSIS — I2510 Atherosclerotic heart disease of native coronary artery without angina pectoris: Secondary | ICD-10-CM | POA: Diagnosis not present

## 2018-10-18 DIAGNOSIS — I130 Hypertensive heart and chronic kidney disease with heart failure and stage 1 through stage 4 chronic kidney disease, or unspecified chronic kidney disease: Secondary | ICD-10-CM | POA: Diagnosis not present

## 2018-10-18 DIAGNOSIS — E1122 Type 2 diabetes mellitus with diabetic chronic kidney disease: Secondary | ICD-10-CM | POA: Diagnosis not present

## 2018-10-18 DIAGNOSIS — J439 Emphysema, unspecified: Secondary | ICD-10-CM | POA: Diagnosis not present

## 2018-10-18 DIAGNOSIS — I5033 Acute on chronic diastolic (congestive) heart failure: Secondary | ICD-10-CM | POA: Diagnosis not present

## 2018-10-18 NOTE — Telephone Encounter (Signed)
Provider notified verbally.

## 2018-10-18 NOTE — Telephone Encounter (Signed)
Copied from [place] [id number]. Topic: Quick Communication - See Telephone Encounter >> Oct 18, 2018 11:36 AM Antonieta Iba C wrote: CRM for notification. See Telephone encounter for: 10/18/18.  Clair Gulling - PT w/ [facility] - [phone]   Original plan was to re-cert pt. However today pt reported not being home bound. PT recommended outpt PT, pt refused and plan to continue exercises at home. Pt O2 level is ranging between 84-97 depending upon exertion.

## 2018-10-24 DIAGNOSIS — C61 Malignant neoplasm of prostate: Secondary | ICD-10-CM | POA: Diagnosis not present

## 2018-10-25 ENCOUNTER — Emergency Department (HOSPITAL_COMMUNITY): Payer: Medicare Other

## 2018-10-25 ENCOUNTER — Encounter (HOSPITAL_COMMUNITY): Payer: Self-pay | Admitting: Emergency Medicine

## 2018-10-25 ENCOUNTER — Inpatient Hospital Stay (HOSPITAL_COMMUNITY)
Admission: EM | Admit: 2018-10-25 | Discharge: 2018-11-18 | DRG: 193 | Disposition: E | Payer: Medicare Other | Attending: Internal Medicine | Admitting: Internal Medicine

## 2018-10-25 ENCOUNTER — Other Ambulatory Visit: Payer: Self-pay

## 2018-10-25 DIAGNOSIS — Z823 Family history of stroke: Secondary | ICD-10-CM | POA: Diagnosis not present

## 2018-10-25 DIAGNOSIS — Z87891 Personal history of nicotine dependence: Secondary | ICD-10-CM

## 2018-10-25 DIAGNOSIS — J181 Lobar pneumonia, unspecified organism: Principal | ICD-10-CM

## 2018-10-25 DIAGNOSIS — N183 Chronic kidney disease, stage 3 unspecified: Secondary | ICD-10-CM | POA: Diagnosis present

## 2018-10-25 DIAGNOSIS — Z8673 Personal history of transient ischemic attack (TIA), and cerebral infarction without residual deficits: Secondary | ICD-10-CM | POA: Diagnosis not present

## 2018-10-25 DIAGNOSIS — Z9981 Dependence on supplemental oxygen: Secondary | ICD-10-CM | POA: Diagnosis not present

## 2018-10-25 DIAGNOSIS — E1122 Type 2 diabetes mellitus with diabetic chronic kidney disease: Secondary | ICD-10-CM | POA: Diagnosis present

## 2018-10-25 DIAGNOSIS — R0602 Shortness of breath: Secondary | ICD-10-CM | POA: Diagnosis not present

## 2018-10-25 DIAGNOSIS — J189 Pneumonia, unspecified organism: Secondary | ICD-10-CM | POA: Diagnosis not present

## 2018-10-25 DIAGNOSIS — I272 Pulmonary hypertension, unspecified: Secondary | ICD-10-CM | POA: Diagnosis present

## 2018-10-25 DIAGNOSIS — Z952 Presence of prosthetic heart valve: Secondary | ICD-10-CM

## 2018-10-25 DIAGNOSIS — J9621 Acute and chronic respiratory failure with hypoxia: Secondary | ICD-10-CM | POA: Diagnosis present

## 2018-10-25 DIAGNOSIS — J9611 Chronic respiratory failure with hypoxia: Secondary | ICD-10-CM | POA: Diagnosis present

## 2018-10-25 DIAGNOSIS — I480 Paroxysmal atrial fibrillation: Secondary | ICD-10-CM | POA: Diagnosis present

## 2018-10-25 DIAGNOSIS — J811 Chronic pulmonary edema: Secondary | ICD-10-CM | POA: Diagnosis not present

## 2018-10-25 DIAGNOSIS — J439 Emphysema, unspecified: Secondary | ICD-10-CM | POA: Diagnosis present

## 2018-10-25 DIAGNOSIS — B9789 Other viral agents as the cause of diseases classified elsewhere: Secondary | ICD-10-CM | POA: Diagnosis present

## 2018-10-25 DIAGNOSIS — F419 Anxiety disorder, unspecified: Secondary | ICD-10-CM | POA: Diagnosis present

## 2018-10-25 DIAGNOSIS — J440 Chronic obstructive pulmonary disease with acute lower respiratory infection: Secondary | ICD-10-CM | POA: Diagnosis present

## 2018-10-25 DIAGNOSIS — I2510 Atherosclerotic heart disease of native coronary artery without angina pectoris: Secondary | ICD-10-CM | POA: Diagnosis present

## 2018-10-25 DIAGNOSIS — I4891 Unspecified atrial fibrillation: Secondary | ICD-10-CM | POA: Diagnosis present

## 2018-10-25 DIAGNOSIS — Z8546 Personal history of malignant neoplasm of prostate: Secondary | ICD-10-CM

## 2018-10-25 DIAGNOSIS — B9710 Unspecified enterovirus as the cause of diseases classified elsewhere: Secondary | ICD-10-CM | POA: Diagnosis present

## 2018-10-25 DIAGNOSIS — E785 Hyperlipidemia, unspecified: Secondary | ICD-10-CM | POA: Diagnosis present

## 2018-10-25 DIAGNOSIS — Z66 Do not resuscitate: Secondary | ICD-10-CM | POA: Diagnosis present

## 2018-10-25 DIAGNOSIS — Z7952 Long term (current) use of systemic steroids: Secondary | ICD-10-CM

## 2018-10-25 DIAGNOSIS — Z7901 Long term (current) use of anticoagulants: Secondary | ICD-10-CM

## 2018-10-25 DIAGNOSIS — I5032 Chronic diastolic (congestive) heart failure: Secondary | ICD-10-CM | POA: Diagnosis present

## 2018-10-25 DIAGNOSIS — R0600 Dyspnea, unspecified: Secondary | ICD-10-CM

## 2018-10-25 DIAGNOSIS — Z7984 Long term (current) use of oral hypoglycemic drugs: Secondary | ICD-10-CM

## 2018-10-25 DIAGNOSIS — Z515 Encounter for palliative care: Secondary | ICD-10-CM

## 2018-10-25 DIAGNOSIS — R918 Other nonspecific abnormal finding of lung field: Secondary | ICD-10-CM | POA: Diagnosis present

## 2018-10-25 DIAGNOSIS — I130 Hypertensive heart and chronic kidney disease with heart failure and stage 1 through stage 4 chronic kidney disease, or unspecified chronic kidney disease: Secondary | ICD-10-CM | POA: Diagnosis present

## 2018-10-25 DIAGNOSIS — R062 Wheezing: Secondary | ICD-10-CM | POA: Diagnosis not present

## 2018-10-25 DIAGNOSIS — I5030 Unspecified diastolic (congestive) heart failure: Secondary | ICD-10-CM | POA: Diagnosis present

## 2018-10-25 DIAGNOSIS — E119 Type 2 diabetes mellitus without complications: Secondary | ICD-10-CM

## 2018-10-25 DIAGNOSIS — R05 Cough: Secondary | ICD-10-CM | POA: Diagnosis not present

## 2018-10-25 LAB — CBC WITH DIFFERENTIAL/PLATELET
ABS IMMATURE GRANULOCYTES: 0.36 10*3/uL — AB (ref 0.00–0.07)
Basophils Absolute: 0 10*3/uL (ref 0.0–0.1)
Basophils Relative: 0 %
Eosinophils Absolute: 0 10*3/uL (ref 0.0–0.5)
Eosinophils Relative: 0 %
HCT: 34.9 % — ABNORMAL LOW (ref 39.0–52.0)
Hemoglobin: 10.8 g/dL — ABNORMAL LOW (ref 13.0–17.0)
Immature Granulocytes: 4 %
LYMPHS PCT: 11 %
Lymphs Abs: 1.1 10*3/uL (ref 0.7–4.0)
MCH: 26.3 pg (ref 26.0–34.0)
MCHC: 30.9 g/dL (ref 30.0–36.0)
MCV: 84.9 fL (ref 80.0–100.0)
Monocytes Absolute: 0.4 10*3/uL (ref 0.1–1.0)
Monocytes Relative: 5 %
Neutro Abs: 7.7 10*3/uL (ref 1.7–7.7)
Neutrophils Relative %: 80 %
Platelets: 160 10*3/uL (ref 150–400)
RBC: 4.11 MIL/uL — ABNORMAL LOW (ref 4.22–5.81)
RDW: 16.4 % — ABNORMAL HIGH (ref 11.5–15.5)
WBC: 9.7 10*3/uL (ref 4.0–10.5)
nRBC: 0.5 % — ABNORMAL HIGH (ref 0.0–0.2)

## 2018-10-25 LAB — DIGOXIN LEVEL: DIGOXIN LVL: 0.8 ng/mL (ref 0.8–2.0)

## 2018-10-25 LAB — RESPIRATORY PANEL BY PCR
Adenovirus: NOT DETECTED
Bordetella pertussis: NOT DETECTED
CORONAVIRUS OC43-RVPPCR: NOT DETECTED
Chlamydophila pneumoniae: NOT DETECTED
Coronavirus 229E: NOT DETECTED
Coronavirus HKU1: NOT DETECTED
Coronavirus NL63: NOT DETECTED
Influenza A: NOT DETECTED
Influenza B: NOT DETECTED
Metapneumovirus: NOT DETECTED
Mycoplasma pneumoniae: NOT DETECTED
Parainfluenza Virus 1: NOT DETECTED
Parainfluenza Virus 2: NOT DETECTED
Parainfluenza Virus 3: NOT DETECTED
Parainfluenza Virus 4: NOT DETECTED
Respiratory Syncytial Virus: NOT DETECTED
Rhinovirus / Enterovirus: NOT DETECTED

## 2018-10-25 LAB — EXPECTORATED SPUTUM ASSESSMENT W GRAM STAIN, RFLX TO RESP C

## 2018-10-25 LAB — BASIC METABOLIC PANEL
Anion gap: 16 — ABNORMAL HIGH (ref 5–15)
BUN: 16 mg/dL (ref 8–23)
CO2: 24 mmol/L (ref 22–32)
CREATININE: 1.38 mg/dL — AB (ref 0.61–1.24)
Calcium: 9.1 mg/dL (ref 8.9–10.3)
Chloride: 98 mmol/L (ref 98–111)
GFR calc Af Amer: 56 mL/min — ABNORMAL LOW (ref >60)
GFR calc non Af Amer: 48 mL/min — ABNORMAL LOW (ref >60)
Glucose, Bld: 279 mg/dL — ABNORMAL HIGH (ref 70–99)
Potassium: 3.8 mmol/L (ref 3.5–5.1)
Sodium: 138 mmol/L (ref 135–145)

## 2018-10-25 LAB — LACTIC ACID, PLASMA: Lactic Acid, Venous: 1.8 mmol/L (ref 0.5–1.9)

## 2018-10-25 LAB — STREP PNEUMONIAE URINARY ANTIGEN: Strep Pneumo Urinary Antigen: NEGATIVE

## 2018-10-25 LAB — EXPECTORATED SPUTUM ASSESSMENT W REFEX TO RESP CULTURE

## 2018-10-25 LAB — PROTIME-INR
INR: 2.29
Prothrombin Time: 24.9 seconds — ABNORMAL HIGH (ref 11.4–15.2)

## 2018-10-25 LAB — GLUCOSE, CAPILLARY
Glucose-Capillary: 357 mg/dL — ABNORMAL HIGH (ref 70–99)
Glucose-Capillary: 174 mg/dL — ABNORMAL HIGH (ref 70–99)

## 2018-10-25 LAB — BRAIN NATRIURETIC PEPTIDE: B Natriuretic Peptide: 95.3 pg/mL (ref 0.0–100.0)

## 2018-10-25 MED ORDER — BENZONATATE 100 MG PO CAPS
200.0000 mg | ORAL_CAPSULE | Freq: Three times a day (TID) | ORAL | Status: DC | PRN
  Administered 2018-10-25 – 2018-10-29 (×4): 200 mg via ORAL
  Filled 2018-10-25 (×4): qty 2

## 2018-10-25 MED ORDER — SODIUM CHLORIDE 0.9 % IV SOLN
INTRAVENOUS | Status: DC
  Administered 2018-10-25: 10 mL/h via INTRAVENOUS

## 2018-10-25 MED ORDER — POTASSIUM CHLORIDE IN NACL 20-0.9 MEQ/L-% IV SOLN
INTRAVENOUS | Status: DC
  Administered 2018-10-25 – 2018-10-26 (×2): via INTRAVENOUS
  Filled 2018-10-25 (×2): qty 1000

## 2018-10-25 MED ORDER — SODIUM CHLORIDE 0.9 % IV SOLN
1.0000 g | INTRAVENOUS | Status: DC
  Administered 2018-10-25 – 2018-10-29 (×5): 1 g via INTRAVENOUS
  Filled 2018-10-25 (×5): qty 10

## 2018-10-25 MED ORDER — SODIUM CHLORIDE 0.9 % IV SOLN
500.0000 mg | INTRAVENOUS | Status: DC
  Administered 2018-10-25 – 2018-10-29 (×5): 500 mg via INTRAVENOUS
  Filled 2018-10-25 (×5): qty 500

## 2018-10-25 MED ORDER — INSULIN ASPART 100 UNIT/ML ~~LOC~~ SOLN
0.0000 [IU] | Freq: Three times a day (TID) | SUBCUTANEOUS | Status: DC
  Administered 2018-10-25: 15 [IU] via SUBCUTANEOUS
  Administered 2018-10-26: 8 [IU] via SUBCUTANEOUS
  Administered 2018-10-26: 2 [IU] via SUBCUTANEOUS
  Administered 2018-10-27: 3 [IU] via SUBCUTANEOUS
  Administered 2018-10-27: 2 [IU] via SUBCUTANEOUS
  Administered 2018-10-28 – 2018-10-29 (×3): 3 [IU] via SUBCUTANEOUS
  Administered 2018-10-29: 2 [IU] via SUBCUTANEOUS
  Administered 2018-10-29: 3 [IU] via SUBCUTANEOUS

## 2018-10-25 NOTE — ED Triage Notes (Signed)
Pt has been feeling SOB this morning since 5am. Pt used his albuterol inhaler. EMS reports wheezing. Pt is alert and oriented. 98% on breathing treatment. EMS gave 2.5 mg albuterol, 125mg  solu medrol.  Afib on the heart monitor, HR 120 (known hx of afib). BP 119/74, resp 19.

## 2018-10-25 NOTE — ED Notes (Signed)
Per Sharyn Lull RN on [place] the room for the pt is not ready at this time. Will call back when it is ready.

## 2018-10-25 NOTE — ED Notes (Signed)
X-ray at bedside

## 2018-10-25 NOTE — ED Notes (Signed)
Pt attempted to urinate in urinal. Pt became short of breath and this RN helped the pt back into bed.

## 2018-10-25 NOTE — ED Provider Notes (Signed)
[facility] EMERGENCY DEPARTMENT Provider Note   CSN: [id number] Arrival date & time: 11/13/2018  1202     History   Chief Complaint Chief Complaint  Patient presents with  . Shortness of Breath    HPI [name] is a 83 y.o. male.  83 year old male presents with acute onset of shortness of breath which began this morning.  He has a history of COPD as well as CHF and A. fib.  Notes cough productive of green-yellow sputum.  Denies any vomiting or diarrhea.  Denies any fever or chills per denies any anginal type chest pain.  No orthopnea but some dyspnea exertion.  Symptoms persistent and called EMS and was transported here.  States he was at his baseline yesterday.     Past Medical History:  Diagnosis Date  . Adrenal adenoma   . Atrial fibrillation (HCC)    Rate control + Coumadin  . CAD (coronary artery disease)    Lexiscan Myoview (01/2014): Lateral soft tissue attenuation, no ischemia, not gated, low risk  . Cancer ([place])   . CAP (community acquired pneumonia)    Hospitalized 10/2014  . Chronic diastolic heart failure ([place])   . Chronic hypoxemic respiratory failure (HCC)    from COPD  . Chronic renal insufficiency, stage III (moderate) (HCC)    CrCl 50s  . COPD (chronic obstructive pulmonary disease) (HCC)    oxygen-dependent (2.5 L 24/7).  Improved on starting chronic prednisone 06/2017 (increased to 20 mg qd 08/2017)  Progressive worsening dyspnea through 2019: 3 L oxygen, 30mg  prednisone qd chronically as of 07/2018.  . Diabetes mellitus without complication ([place]) 24/26/8341   A1c 6.5 % 05/2016  . Diverticulosis   . Dyslipidemia   . Embolic stroke ([place])    d/t endocarditis 2008  . Hemorrhoids   . History of mitral valve replacement    needs SBE prophylaxis  . History of prostate cancer remote past   f/u by Dr. Claris Che; no recurrence as of f/u 10/2015.  PSA rising from 2013 to 04/2018 (PSA 04/25/18=18.49): urol to rpt PSA and check CT and bone scan   approx 10/2018.  Marland Kitchen HTN (hypertension)   . Left lower lobe pulmonary nodule July/Aug 2019.   04/2018: enlarging irregular nodular opacity.  CT surgery-->PET scan: inflamm/post-infect vs malignant. Orig plan was to f/u CT chest 3 mo per CT surg, then pt was admitted again for pneumonia and rpt CT chest showed equivocal change in the region of question, pulm MD plan was to repeat CT chest again 02/2019.  . Mediastinal lymphadenopathy   . Pulmonary hypertension ([place]) 01/09/2015   secondary  . Right thyroid nodule   . Thrombocytopenia ([place])   . TIA (transient ischemic attack) 03/25/2011   ? of  (transient diplopia)  . Ulcerative colitis    left-sided/segmental, associated with diverticulosis.  Sulfasalazine per GI (Dr. Carlean Purl).  . Warthin's tumor     Patient Active Problem List   Diagnosis Date Noted  . Acute on chronic diastolic CHF (congestive heart failure) ([place]) 08/15/2018  . Acute exacerbation of congestive heart failure ([place]) 08/14/2018  . HLD (hyperlipidemia) 08/14/2018  . CKD (chronic kidney disease) stage 3, GFR 30-59 ml/min (HCC) 08/14/2018  . Leukocytosis 08/14/2018  . Mass of lower lobe of left lung 05/15/2018  . AKI (acute kidney injury) ([place]) 03/04/2018  . Community acquired pneumonia of left lower lobe of lung ([place]) 03/04/2018  . DM2 (diabetes mellitus, type 2) ([place]) 03/04/2018  . Acute on chronic respiratory failure  with hypoxia ([place]) 07/18/2017  . Chronic congestive heart failure ([place]) 07/18/2017  . Eye irritation 08/17/2015  . Chronic hypoxemic respiratory failure ([place]) 07/17/2015  . Pulmonary hypertension ([place]) 01/09/2015  . Anxiety state 11/28/2014  . SOB (shortness of breath) 11/28/2014  . Cerebrovascular disease 11/20/2014  . Emotional lability 11/11/2014  . Tachycardia 11/04/2014  . Encounter for therapeutic drug monitoring 03/18/2014  . Atrial fibrillation ([place]) 07/25/2011  . Long term current use of anticoagulant 10/20/2010  . Essential hypertension, benign  08/20/2009  . HYPERLIPIDEMIA, FAMILIAL 08/03/2009  . CAD (coronary artery disease) 08/03/2009  . History of mitral valve repair 08/03/2009  . THYROID NODULE, RIGHT 12/03/2008  . CEREBROVASCULAR ACCIDENT, HX OF 12/03/2008  . COPD (chronic obstructive pulmonary disease) with emphysema ([place]) 06/09/2008  . Segmental colitis associated with sigmoid diverticulosis 12/12/2007  . PROSTATE CANCER, HX OF 01/12/2007  . ENDOCARDITIS 07/04/2006    Class: History of    Past Surgical History:  Procedure Laterality Date  . carotid duplex dopplers  04/2014   No signif plaques; repeat 2 yrs per cardiology  . CATARACT EXTRACTION, BILATERAL    . COLONOSCOPY W/ BIOPSIES  12/19/2007   left-sided colitis, diverticulosis, hemorrhoids  . FLEXIBLE SIGMOIDOSCOPY  01/11/2008; 03/19/2010   2009 and 2011:segmental left colitis, diverticulosis, hemorrhoids  . INGUINAL HERNIA REPAIR     right  . MITRAL VALVE REPAIR  09/22/04   and modified Cox-Maze IV   . MITRAL VALVE REPAIR  09/14/05   redo MV repair d/t endocardiits/embolic events  . PROSTATECTOMY  06/2003   Radical  . RIGHT HEART CATHETERIZATION N/A 01/01/2015   Procedure: RIGHT HEART CATH;  Surgeon: Peter M Martinique, MD;  Location: [facility] CATH LAB;  Service: Cardiovascular;  Laterality: N/A;  . SALIVARY GLAND SURGERY     left resection  . TRANSTHORACIC ECHOCARDIOGRAM  11/2013; 07/2017; 08/16/18   2015: Mod LVH, EF 55-60%, no WM abnormalities, +LA dilation, +severe RV dilation and impaired systolic fxn, +increased pulm art pressures.  Valves ok.  2018- EF 60-65%, wall motion nl, s/p MV repair, mild RV syst dysf.  07/2018 EF 50-55%, unable to eval LV diastolic function, +RV pressure overload.        Home Medications    Prior to Admission medications   Medication Sig Start Date End Date Taking? Authorizing Provider  albuterol (PROVENTIL HFA;VENTOLIN HFA) 108 (90 Base) MCG/ACT inhaler INHALE ONE PUFF BY MOUTH EVERY 6 HOURS AS NEEDED FOR WHEEZING AND FOR SHORTNESS OF  BREATH (USING 5-6 TIMES EVERY 24 HOURS) Patient taking differently: Inhale 1 puff into the lungs every 6 (six) hours as needed for wheezing or shortness of breath.  01/01/18   Collene Gobble, MD  albuterol (PROVENTIL) (2.5 MG/3ML) 0.083% nebulizer solution Take 3 mLs (2.5 mg total) by nebulization every 4 (four) hours. And as needed 12/29/17   Collene Gobble, MD  benzonatate (TESSALON) 200 MG capsule Take 1 capsule (200 mg total) by mouth 3 (three) times daily. 08/20/18   Hongalgi, Lenis Dickinson, MD  budesonide (PULMICORT) 0.5 MG/2ML nebulizer solution USE 1 VIAL IN NEBULIZER TWICE DAILY 06/25/18   Byrum, Rose Fillers, MD  digoxin (LANOXIN) 0.125 MG tablet Take 0.5 tablets (0.0625 mg total) by mouth daily. 08/20/18   Hongalgi, Lenis Dickinson, MD  diltiazem (CARDIZEM CD) 240 MG 24 hr capsule Take 1 capsule (240 mg total) by mouth daily. 01/10/18   Lelon Perla, MD  glipiZIDE (GLUCOTROL XL) 10 MG 24 hr tablet Take 1 tablet (10 mg total) by mouth daily  with breakfast. 07/25/18   McGowen, Adrian Blackwater, MD  LORazepam (ATIVAN) 1 MG tablet 1 tab po bid as needed for anxiety 09/21/18   McGowen, Adrian Blackwater, MD  pantoprazole (PROTONIX) 40 MG tablet Take 1 tablet (40 mg total) by mouth daily. 08/27/18   McGowen, Adrian Blackwater, MD  pravastatin (PRAVACHOL) 40 MG tablet TAKE 1 TABLET EVERY EVENING Patient taking differently: Take 40 mg by mouth daily. TAKE 1 TABLET EVERY EVENING 01/10/18   Lelon Perla, MD  predniSONE (DELTASONE) 20 MG tablet Take 1.5 tablets (30 mg total) by mouth daily with breakfast. 07/24/18   Byrum, Rose Fillers, MD  STIOLTO RESPIMAT 2.5-2.5 MCG/ACT AERS INHALE 2 PUFFS INTO THE LUNGS DAILY. Patient taking differently: Inhale 2 puffs into the lungs daily.  09/20/17   Collene Gobble, MD  torsemide (DEMADEX) 20 MG tablet Take 1 tablet (20 mg total) by mouth daily. 09/18/18   Kuneff, Renee A, DO  warfarin (COUMADIN) 5 MG tablet Take 1/2 to 1 tablet by mouth daily as directed Patient taking differently: Take 2.5-5 mg by mouth as  directed. Take 1/2 tablet (2.5 mg) on MWF & Take 1 tablet (5 mg) all other days. 06/26/18   Lelon Perla, MD    Family History Family History  Problem Relation Age of Onset  . Cancer Mother        spinal  . Stroke Father   . Heart disease Father   . Colon cancer Neg Hx   . Heart attack Neg Hx     Social History Social History   Tobacco Use  . Smoking status: Former Smoker    Packs/day: 3.00    Years: 60.00    Pack years: 180.00    Types: Cigarettes    Last attempt to quit: 09/19/2002    Years since quitting: 16.1  . Smokeless tobacco: Never Used  Substance Use Topics  . Alcohol use: No    Alcohol/week: 0.0 standard drinks  . Drug use: No     Allergies   Patient has no known allergies.   Review of Systems Review of Systems  All other systems reviewed and are negative.    Physical Exam Updated Vital Signs There were no vitals taken for this visit.  Physical Exam Vitals signs and nursing note reviewed.  Constitutional:      General: He is not in acute distress.    Appearance: Normal appearance. He is well-developed. He is not toxic-appearing.  HENT:     Head: Normocephalic and atraumatic.  Eyes:     General: Lids are normal.     Conjunctiva/sclera: Conjunctivae normal.     Pupils: Pupils are equal, round, and reactive to light.  Neck:     Musculoskeletal: Normal range of motion and neck supple.     Thyroid: No thyroid mass.     Trachea: No tracheal deviation.  Cardiovascular:     Rate and Rhythm: Normal rate and regular rhythm.     Heart sounds: Normal heart sounds. No murmur. No gallop.   Pulmonary:     Effort: Tachypnea present. No respiratory distress.     Breath sounds: No stridor. Examination of the right-lower field reveals decreased breath sounds and rhonchi. Examination of the left-lower field reveals decreased breath sounds and rhonchi. Decreased breath sounds and rhonchi present. No wheezing or rales.  Abdominal:     General: Bowel sounds  are normal. There is no distension.     Palpations: Abdomen is soft.     Tenderness:  There is no abdominal tenderness. There is no rebound.  Musculoskeletal: Normal range of motion.        General: No tenderness.  Lymphadenopathy:     Comments: 2+ bilateral lower extremity pitting edema  Skin:    General: Skin is warm and dry.     Findings: No abrasion or rash.  Neurological:     Mental Status: He is alert and oriented to person, place, and time.     GCS: GCS eye subscore is 4. GCS verbal subscore is 5. GCS motor subscore is 6.     Cranial Nerves: No cranial nerve deficit.     Sensory: No sensory deficit.  Psychiatric:        Speech: Speech normal.        Behavior: Behavior normal.      ED Treatments / Results  Labs (all labs ordered are listed, but only abnormal results are displayed) Labs Reviewed  CBC WITH DIFFERENTIAL/PLATELET  BASIC METABOLIC PANEL  PROTIME-INR  BRAIN NATRIURETIC PEPTIDE  DIGOXIN LEVEL    EKG None  Radiology No results found.  Procedures Procedures (including critical care time)  Medications Ordered in ED Medications  0.9 %  sodium chloride infusion (has no administration in time range)     Initial Impression / Assessment and Plan / ED Course  I have reviewed the triage vital signs and the nursing notes.  Pertinent labs & imaging results that were available during my care of the patient were reviewed by me and considered in my medical decision making (see chart for details).     Patient's chest x-ray results noted and consistent with pneumonia.  Patient started on IV antibiotics.  Will admit to the hospitalist service  Final Clinical Impressions(s) / ED Diagnoses   Final diagnoses:  None    ED Discharge Orders    None       Lacretia Leigh, MD 11/12/2018 1322

## 2018-10-25 NOTE — H&P (Signed)
History and Physical    [name] RFF:[id number] DOB: 1935-10-10 DOA: 11/16/2018  PCP: Tammi Sou, MD  Patient coming from: Home  I have personally briefly reviewed patient's old medical records in [place]  Chief Complaint: Breath which began this morning  HPI:  [name] is a 83 y.o. male with medical history significant of mitral valve repair in 3570, prior embolic CVA, paroxysmal A. fib, hypertension, hyperlipidemia, CKD 3, COPD with chronic respiratory failure on home oxygen and chronic steroids, CAD, type 2 diabetes presenting to the hospital for evaluation of shortness of breath which began this morning.  Notes cough productive of yellow sputum.  Denies any vomiting or diarrhea.  Has had no fevers or chills.  No chest pain.  No pleuritic pain.  No orthopnea but some increased dyspnea on exertion.  He is not requiring any increased oxygen as he is currently comfortable in the emergency department on his usual dose of 3 L of oxygen.  His symptoms were persistent family became concerned and activated EMS.  States he was feeling perfectly fine yesterday.  His wife reports that he has not been doing very much in the past 2 months.  ED Course: Vital signs reassuring, chest x-ray consistent with left lower lobe and mild right pneumonia.  Not hypoxic.  Given age and debility will admit to hospital for further evaluation  Review of Systems: As per HPI otherwise all other systems reviewed and  negative.    Past Medical History:  Diagnosis Date  . Adrenal adenoma   . Atrial fibrillation (HCC)    Rate control + Coumadin  . CAD (coronary artery disease)    Lexiscan Myoview (01/2014): Lateral soft tissue attenuation, no ischemia, not gated, low risk  . Cancer ([place])   . CAP (community acquired pneumonia)    Hospitalized 10/2014  . Chronic diastolic heart failure ([place])   . Chronic hypoxemic respiratory failure (HCC)    from COPD  . Chronic renal insufficiency, stage III  (moderate) (HCC)    CrCl 50s  . COPD (chronic obstructive pulmonary disease) (HCC)    oxygen-dependent (2.5 L 24/7).  Improved on starting chronic prednisone 06/2017 (increased to 20 mg qd 08/2017)  Progressive worsening dyspnea through 2019: 3 L oxygen, 30mg  prednisone qd chronically as of 07/2018.  . Diabetes mellitus without complication ([place]) 17/79/3903   A1c 6.5 % 05/2016  . Diverticulosis   . Dyslipidemia   . Embolic stroke ([place])    d/t endocarditis 2008  . Hemorrhoids   . History of mitral valve replacement    needs SBE prophylaxis  . History of prostate cancer remote past   f/u by Dr. Claris Che; no recurrence as of f/u 10/2015.  PSA rising from 2013 to 04/2018 (PSA 04/25/18=18.49): urol to rpt PSA and check CT and bone scan  approx 10/2018.  [place] Kitchen HTN (hypertension)   . Left lower lobe pulmonary nodule July/Aug 2019.   04/2018: enlarging irregular nodular opacity.  CT surgery-->PET scan: inflamm/post-infect vs malignant. Orig plan was to f/u CT chest 3 mo per CT surg, then pt was admitted again for pneumonia and rpt CT chest showed equivocal change in the region of question, pulm MD plan was to repeat CT chest again 02/2019.  . Mediastinal lymphadenopathy   . Pulmonary hypertension ([place]) 01/09/2015   secondary  . Right thyroid nodule   . Thrombocytopenia ([place])   . TIA (transient ischemic attack) 03/25/2011   ? of  (transient diplopia)  . Ulcerative colitis  left-sided/segmental, associated with diverticulosis.  Sulfasalazine per GI (Dr. Carlean Purl).  . Warthin's tumor     Past Surgical History:  Procedure Laterality Date  . carotid duplex dopplers  04/2014   No signif plaques; repeat 2 yrs per cardiology  . CATARACT EXTRACTION, BILATERAL    . COLONOSCOPY W/ BIOPSIES  12/19/2007   left-sided colitis, diverticulosis, hemorrhoids  . FLEXIBLE SIGMOIDOSCOPY  01/11/2008; 03/19/2010   2009 and 2011:segmental left colitis, diverticulosis, hemorrhoids  . INGUINAL HERNIA REPAIR     right  . MITRAL  VALVE REPAIR  09/22/04   and modified Cox-Maze IV   . MITRAL VALVE REPAIR  09/14/05   redo MV repair d/t endocardiits/embolic events  . PROSTATECTOMY  06/2003   Radical  . RIGHT HEART CATHETERIZATION N/A 01/01/2015   Procedure: RIGHT HEART CATH;  Surgeon: Peter M Martinique, MD;  Location: [facility] CATH LAB;  Service: Cardiovascular;  Laterality: N/A;  . SALIVARY GLAND SURGERY     left resection  . TRANSTHORACIC ECHOCARDIOGRAM  11/2013; 07/2017; 08/16/18   2015: Mod LVH, EF 55-60%, no WM abnormalities, +LA dilation, +severe RV dilation and impaired systolic fxn, +increased pulm art pressures.  Valves ok.  2018- EF 60-65%, wall motion nl, s/p MV repair, mild RV syst dysf.  07/2018 EF 50-55%, unable to eval LV diastolic function, +RV pressure overload.    Social History   Social History Narrative   Married, 2 children.   Retired Systems developer from [employer]   180 pack-year tob hx.  No alcohol.     reports that he quit smoking about 16 years ago. His smoking use included cigarettes. He has a 180.00 pack-year smoking history. He has never used smokeless tobacco. He reports that he does not drink alcohol or use drugs.  No Known Allergies  Family History  Problem Relation Age of Onset  . Cancer Mother        spinal  . Stroke Father   . Heart disease Father   . Colon cancer Neg Hx   . Heart attack Neg Hx     Prior to Admission medications   Medication Sig Start Date End Date Taking? Authorizing Provider  albuterol (PROVENTIL HFA;VENTOLIN HFA) 108 (90 Base) MCG/ACT inhaler INHALE ONE PUFF BY MOUTH EVERY 6 HOURS AS NEEDED FOR WHEEZING AND FOR SHORTNESS OF BREATH (USING 5-6 TIMES EVERY 24 HOURS) Patient taking differently: Inhale 1 puff into the lungs every 6 (six) hours as needed for wheezing or shortness of breath.  01/01/18   Collene Gobble, MD  albuterol (PROVENTIL) (2.5 MG/3ML) 0.083% nebulizer solution Take 3 mLs (2.5 mg total) by nebulization every 4 (four) hours. And as needed 12/29/17    Collene Gobble, MD  benzonatate (TESSALON) 200 MG capsule Take 1 capsule (200 mg total) by mouth 3 (three) times daily. 08/20/18   Hongalgi, Lenis Dickinson, MD  budesonide (PULMICORT) 0.5 MG/2ML nebulizer solution USE 1 VIAL IN NEBULIZER TWICE DAILY 06/25/18   Byrum, Rose Fillers, MD  digoxin (LANOXIN) 0.125 MG tablet Take 0.5 tablets (0.0625 mg total) by mouth daily. 08/20/18   Hongalgi, Lenis Dickinson, MD  diltiazem (CARDIZEM CD) 240 MG 24 hr capsule Take 1 capsule (240 mg total) by mouth daily. 01/10/18   Lelon Perla, MD  glipiZIDE (GLUCOTROL XL) 10 MG 24 hr tablet Take 1 tablet (10 mg total) by mouth daily with breakfast. 07/25/18   McGowen, Adrian Blackwater, MD  LORazepam (ATIVAN) 1 MG tablet 1 tab po bid as needed for anxiety 09/21/18   McGowen,  Adrian Blackwater, MD  pantoprazole (PROTONIX) 40 MG tablet Take 1 tablet (40 mg total) by mouth daily. 08/27/18   McGowen, Adrian Blackwater, MD  pravastatin (PRAVACHOL) 40 MG tablet TAKE 1 TABLET EVERY EVENING Patient taking differently: Take 40 mg by mouth daily. TAKE 1 TABLET EVERY EVENING 01/10/18   Lelon Perla, MD  predniSONE (DELTASONE) 20 MG tablet Take 1.5 tablets (30 mg total) by mouth daily with breakfast. 07/24/18   Byrum, Rose Fillers, MD  STIOLTO RESPIMAT 2.5-2.5 MCG/ACT AERS INHALE 2 PUFFS INTO THE LUNGS DAILY. Patient taking differently: Inhale 2 puffs into the lungs daily.  09/20/17   Collene Gobble, MD  torsemide (DEMADEX) 20 MG tablet Take 1 tablet (20 mg total) by mouth daily. 09/18/18   Kuneff, Renee A, DO  warfarin (COUMADIN) 5 MG tablet Take 1/2 to 1 tablet by mouth daily as directed Patient taking differently: Take 2.5-5 mg by mouth as directed. Take 1/2 tablet (2.5 mg) on MWF & Take 1 tablet (5 mg) all other days. 06/26/18   Lelon Perla, MD    Physical Exam:  Constitutional: NAD, calm, comfortable Vitals:   11/03/2018 1238 11/02/2018 1245  BP: 118/64 (!) 114/57  Pulse: (!) 101 93  Resp: 19 (!) 25  SpO2: 95% 95%   Eyes: PERRL, lids and conjunctivae normal ENMT:  Mucous membranes are moist. Posterior pharynx clear of any exudate or lesions.Normal dentition.  Neck: normal, supple, no masses, no thyromegaly Respiratory: Coarse breath sounds bilaterally with rhonchi in the left lower lobe and slightly in the right middle lobe.  No crackles no wheezing no increased respiratory effort Cardiovascular: Regular rate and rhythm, no murmurs / rubs / gallops. No extremity edema. 2+ pedal pulses. No carotid bruits.  Abdomen: no tenderness, no masses palpated. No hepatosplenomegaly. Bowel sounds positive.  Musculoskeletal: no clubbing / cyanosis. No joint deformity upper and lower extremities. Good ROM, no contractures. Normal muscle tone.  Skin: no rashes, lesions, ulcers. No induration Neurologic: CN 2-12 grossly intact. Sensation intact, DTR normal. Strength 5/5 in all 4.  Psychiatric: Normal judgment and insight. Alert and oriented x 3. Normal mood.     Labs on Admission: I have personally reviewed following labs and imaging studies  CBC: Recent Labs  Lab 11/09/2018 1208  WBC 9.7  NEUTROABS 7.7  HGB 10.8*  HCT 34.9*  MCV 84.9  PLT 982   Basic Metabolic Panel: Recent Labs  Lab 11/01/2018 1208  NA 138  K 3.8  CL 98  CO2 24  GLUCOSE 279*  BUN 16  CREATININE 1.38*  CALCIUM 9.1   GFR: Estimated Creatinine Clearance: 41.3 mL/min (A) (by C-G formula based on SCr of 1.38 mg/dL (H)). No results for input(s): CKTOTAL, CKMB, CKMBINDEX, TROPONINI in the last 168 hours. BNP (last 3 results) Recent Labs    09/18/18 0952  PROBNP 78.0   Urine analysis:    Component Value Date/Time   COLORURINE YELLOW 08/14/2018 2350   APPEARANCEUR CLEAR 08/14/2018 2350   LABSPEC 1.012 08/14/2018 2350   PHURINE 6.0 08/14/2018 2350   GLUCOSEU >=500 (A) 08/14/2018 2350   HGBUR SMALL (A) 08/14/2018 2350   BILIRUBINUR NEGATIVE 08/14/2018 2350   BILIRUBINUR small 11/27/2014 1025   KETONESUR NEGATIVE 08/14/2018 2350   PROTEINUR NEGATIVE 08/14/2018 2350   UROBILINOGEN  4.0 11/27/2014 1025   UROBILINOGEN 0.2 11/06/2014 0851   NITRITE NEGATIVE 08/14/2018 2350   LEUKOCYTESUR NEGATIVE 08/14/2018 2350    Radiological Exams on Admission: Dg Chest Port 1 View  Result Date:  11/10/2018 CLINICAL DATA:  Shortness of Breath EXAM: PORTABLE CHEST 1 VIEW COMPARISON:  Chest CT August 17, 2018 and chest radiograph August 14, 2018 FINDINGS: There is patchy airspace consolidation in the left base. There is mild right base atelectasis. Heart is mildly enlarged with pulmonary vascularity normal. The patient is status post mitral valve replacement. No adenopathy. There is aortic atherosclerosis. No bone lesions. IMPRESSION: Left lower lobe airspace consolidation consistent with pneumonia. Mild right base atelectasis. Stable cardiac prominence. There is aortic atherosclerosis. Patient is status post mitral valve replacement. Followup PA and lateral chest radiographs recommended in 3-4 weeks following trial of antibiotic therapy to ensure resolution and exclude underlying malignancy. Electronically Signed   By: Lowella Grip III M.D.   On: 10/24/2018 12:25    EKG: Independently reviewed.  Patient with ventricular premature complexes  Assessment/Plan Principal Problem:   Community acquired pneumonia of left lower lobe of lung ([place]) Active Problems:   Chronic respiratory failure with hypoxia (HCC)   CAD (coronary artery disease)   COPD (chronic obstructive pulmonary disease) with emphysema (HCC)   Atrial fibrillation (HCC)   Pulmonary hypertension (HCC)   DM2 (diabetes mellitus, type 2) (HCC)   CKD (chronic kidney disease) stage 3, GFR 30-59 ml/min (HCC)   (HFpEF) heart failure with preserved ejection fraction (HCC)   Mass of lower lobe of left lung   LLL pneumonia ([place])   1.  Community-acquired pneumonia of the left lower lobe of the lungs: Patient has started on medications for community-acquired pneumonia he did have a brief hospital stay for pneumonia last December.   It safe to start him on antibiotics for community-acquired pneumonia and proceed from there should he not respond.  Pete chest x-ray in a.m. recheck CBC in a.m.  2.  Chronic respiratory failure with hypoxia: We will continue home oxygen supplementation.  Monitor for desaturations.  3.  Coronary artery disease: Noted continue home medications.  4.  COPD with emphysema: Currently not wheezing.  Do not think patient requires steroids at this point.  We will continue home nebulizers.  5.  Atrial fibrillation: Continue warfarin continue rate control currently stable.  6.  Pulmonary hypertension: Noted on echocardiogram last admission.  We will continue to monitor.  Continue present management.  7.  Diabetes type 2: We will place on diabetes protocol.  8.  Chronic kidney disease stage III: Noted will avoid nephrotoxic agents.  9.  Heart failure with preserved ejection fraction: Continue present management.  Last echocardiogram done 2 months ago.  No need to repeat.  10.  Mass of the lower lobe of the left lung: Being monitored by his primary care physicians.    DVT prophylaxis: On warfarin Code Status: Do not attempt resuscitation Family Communication: With patient's wife, patient's son, and patient's sister who are present at admission. Disposition Plan: Home versus skilled facility to be determined in 3 or 4 days Consults called: None Admission status: Inpatient medical telemetry    Lady Deutscher MD FACP [facility] Pager [phone]  How to contact the [facility] Attending or Consulting provider [place] or covering provider during after hours [place], for this patient?  1. Check the care team in [facility] and look for a) attending/consulting TRH provider listed and b) the [facility] team listed 2. Log into [email or web address] and use [place]'s universal password to access. If you do not have the password, please contact the hospital operator. 3. Locate the [facility] provider you are looking for  under [facility]  and page to a number that you can be directly reached. 4. If you still have difficulty reaching the provider, please page the [facility] (Director on Call) for the Hospitalists listed on amion for assistance.  If 7PM-7AM, please contact night-coverage [email or web address] Password [facility]  10/22/2018, 2:34 PM

## 2018-10-25 NOTE — Progress Notes (Addendum)
23:22Patient had 2.12 sec pause. VSS. Text paged Keturah Shavers . No new orders received. Will continue to monitor. 23:50 Patient had 5 beats of VTach. Text Paged C. Bodenheimer, NP.  Orders recieved for BMP and Magnesium .Will continue to monitor

## 2018-10-26 ENCOUNTER — Ambulatory Visit: Payer: Medicare Other | Admitting: Family Medicine

## 2018-10-26 ENCOUNTER — Inpatient Hospital Stay (HOSPITAL_COMMUNITY): Payer: Medicare Other

## 2018-10-26 LAB — BASIC METABOLIC PANEL
Anion gap: 8 (ref 5–15)
BUN: 18 mg/dL (ref 8–23)
CO2: 28 mmol/L (ref 22–32)
Calcium: 8.5 mg/dL — ABNORMAL LOW (ref 8.9–10.3)
Chloride: 102 mmol/L (ref 98–111)
Creatinine, Ser: 1.17 mg/dL (ref 0.61–1.24)
GFR calc Af Amer: >60 mL/min (ref >60)
GFR calc non Af Amer: 59 mL/min — ABNORMAL LOW (ref >60)
Glucose, Bld: 154 mg/dL — ABNORMAL HIGH (ref 70–99)
Potassium: 4.4 mmol/L (ref 3.5–5.1)
Sodium: 138 mmol/L (ref 135–145)

## 2018-10-26 LAB — GLUCOSE, CAPILLARY
Glucose-Capillary: 184 mg/dL — ABNORMAL HIGH (ref 70–99)
Glucose-Capillary: 170 mg/dL — ABNORMAL HIGH (ref 70–99)
Glucose-Capillary: 264 mg/dL — ABNORMAL HIGH (ref 70–99)
Glucose-Capillary: 239 mg/dL — ABNORMAL HIGH (ref 70–99)
Glucose-Capillary: 139 mg/dL — ABNORMAL HIGH (ref 70–99)
Glucose-Capillary: 161 mg/dL — ABNORMAL HIGH (ref 70–99)

## 2018-10-26 LAB — MAGNESIUM: Magnesium: 1.9 mg/dL (ref 1.7–2.4)

## 2018-10-26 LAB — HIV ANTIBODY (ROUTINE TESTING W REFLEX): HIV Screen 4th Generation wRfx: NONREACTIVE

## 2018-10-26 MED ORDER — LORAZEPAM 1 MG PO TABS
1.0000 mg | ORAL_TABLET | Freq: Two times a day (BID) | ORAL | Status: DC | PRN
  Administered 2018-10-26 – 2018-10-29 (×5): 1 mg via ORAL
  Filled 2018-10-26 (×5): qty 1

## 2018-10-26 MED ORDER — BUDESONIDE 0.5 MG/2ML IN SUSP: 0.5000 mg | Freq: Two times a day (BID) | RESPIRATORY_TRACT | Status: DC

## 2018-10-26 MED ORDER — DILTIAZEM HCL ER COATED BEADS 240 MG PO CP24
240.0000 mg | ORAL_CAPSULE | Freq: Every day | ORAL | Status: DC
  Administered 2018-10-26 – 2018-10-29 (×4): 240 mg via ORAL
  Filled 2018-10-26 (×4): qty 1

## 2018-10-26 MED ORDER — WARFARIN SODIUM 2.5 MG PO TABS: 2.5000 mg | ORAL_TABLET | ORAL | Status: DC

## 2018-10-26 MED ORDER — WARFARIN SODIUM 2.5 MG PO TABS
2.5000 mg | ORAL_TABLET | Freq: Once | ORAL | Status: AC
  Administered 2018-10-26: 2.5 mg via ORAL
  Filled 2018-10-26: qty 1

## 2018-10-26 MED ORDER — UMECLIDINIUM-VILANTEROL 62.5-25 MCG/INH IN AEPB
1.0000 | INHALATION_SPRAY | Freq: Every day | RESPIRATORY_TRACT | Status: DC
  Administered 2018-10-26: 1 via RESPIRATORY_TRACT
  Filled 2018-10-26: qty 14

## 2018-10-26 MED ORDER — PRAVASTATIN SODIUM 40 MG PO TABS
40.0000 mg | ORAL_TABLET | Freq: Every evening | ORAL | Status: DC
  Administered 2018-10-26 – 2018-10-29 (×4): 40 mg via ORAL
  Filled 2018-10-26 (×4): qty 1

## 2018-10-26 MED ORDER — WARFARIN SODIUM 5 MG PO TABS: 5.0000 mg | ORAL_TABLET | ORAL | Status: DC

## 2018-10-26 MED ORDER — TORSEMIDE 20 MG PO TABS
20.0000 mg | ORAL_TABLET | Freq: Every day | ORAL | Status: DC
  Administered 2018-10-26 – 2018-10-29 (×4): 20 mg via ORAL
  Filled 2018-10-26 (×4): qty 1

## 2018-10-26 MED ORDER — PANTOPRAZOLE SODIUM 40 MG PO TBEC
40.0000 mg | DELAYED_RELEASE_TABLET | Freq: Every day | ORAL | Status: DC
  Administered 2018-10-26 – 2018-10-29 (×4): 40 mg via ORAL
  Filled 2018-10-26 (×4): qty 1

## 2018-10-26 MED ORDER — WARFARIN - PHYSICIAN DOSING INPATIENT: Freq: Every day | Status: DC

## 2018-10-26 MED ORDER — BUDESONIDE 0.5 MG/2ML IN SUSP
0.5000 mg | Freq: Two times a day (BID) | RESPIRATORY_TRACT | Status: DC
  Administered 2018-10-27 – 2018-10-30 (×6): 0.5 mg via RESPIRATORY_TRACT
  Filled 2018-10-26 (×6): qty 2

## 2018-10-26 MED ORDER — DIGOXIN 125 MCG PO TABS
0.0625 mg | ORAL_TABLET | Freq: Every day | ORAL | Status: DC
  Administered 2018-10-26 – 2018-10-29 (×4): 0.0625 mg via ORAL
  Filled 2018-10-26 (×5): qty 1

## 2018-10-26 MED ORDER — NON FORMULARY: 2.0000 | Freq: Every day | Status: DC

## 2018-10-26 MED ORDER — UMECLIDINIUM-VILANTEROL 62.5-25 MCG/INH IN AEPB
1.0000 | INHALATION_SPRAY | Freq: Every day | RESPIRATORY_TRACT | Status: DC
  Administered 2018-10-27 – 2018-10-29 (×3): 1 via RESPIRATORY_TRACT

## 2018-10-26 MED ORDER — WARFARIN - PHARMACIST DOSING INPATIENT
Freq: Every day | Status: DC
  Administered 2018-10-27: 19:00:00

## 2018-10-26 NOTE — Progress Notes (Signed)
PROGRESS NOTE  [name] RSW:[id number] DOB: [birth year] DOA: 10/28/2018 PCP: Tammi Sou, MD  Brief History   The patient is an 83 yr old man who carries a past medical history significant for mitral valve repair in 2006, history of embolic CVA, paroxysmal atrial fibrillation, hypertension, hyperlipidemia, CKD 3, COPD with chronic respiratory failure on home O2 and chronic steroids, CAD, and DM II. The patient presented with complaints of cough productive of yellow sputum and shortness of breath. He denies fevers, chills, nausea, vomiting or diarrhea. He denies chest pain or pleuritic pain. He is saturating in the low to mid nineties on his baseline FIO2 of 3L of O2. His symptoms began on 10/26/2018.   ED evaluation demonstrated CXR consistent with LLL and mild right pneumonia.   A & P  Community-acquired pneumonia of the left lower lobe of the lungs: Patient has started on medications for community-acquired pneumonia he did have a brief hospital stay for pneumonia last December. The patient has been started on empiric azithromycin and rocephin. Repeat CXR demonstrated mild vascular congestion and interstitial edema that is slightly increased from prior exam. Respiratory viral panel was positive for Rhinovirus/Enterovirus. Will check procalcitonin and plan to discontinue antibiotics if negative.   CHFpEF: Echocardiogram performed 07/2018 demonstrated and EF of 50-55%. Study was not technically sufficient to evaluate the patient for diastolic dysfunction. Will continue patient on home dose of Demadex. Will adjust according to the results of procalcitonin. BNP is 95.3.  Chronic respiratory failure with hypoxia: We will continue home oxygen supplementation.  Monitor for desaturations. The patient is currently saturating at 98% on his baseline O2 of 3 liters.  Coronary artery disease: Noted continue home medications. Check troponins.  COPD with emphysema: Currently not wheezing.  Do not  think patient requires steroids at this point.  We will continue home nebulizers.  Atrial fibrillation: the patient will be continued on his home dose of Diltiazem. Pharmacy to dose coumadin.  Pulmonary hypertension: Noted on echocardiogram last admission.  We will continue to monitor.  Continue present management.  Diabetes type 2: We will place on diabetes protocol. His glucoses are being followed with FSBS and SSI.  Chronic kidney disease stage III: Noted will avoid nephrotoxic agents.  Mass of the lower lobe of the left lung: Being monitored by his primary care physicians.  DVT prophylaxis: Heparin. Coumadin being started by pharmacy. Code Status: DNR Family Communication: Emergency contact is Damieon Armendariz (spouse), [phone] Disposition Plan: tbd   Terrina Docter, DO [facility] Direct contact: see [email or web address]  7PM-7AM contact night coverage as above 10/26/2018, 2:41 PM  LOS: 1 day   Antibiotics  . Rocephin and azithromycin  Interval History/Subjective  The patient is resting comfortably. No new complaints.  Objective   Vitals:  Vitals:   10/28/2018 2310 10/26/18 0821  BP: (!) 141/68 129/74  Pulse: 87 71  Resp: (!) 21 18  Temp: 98.8 F (37.1 C)   SpO2: 94% 98%    Exam:  Constitutional:  . The patient is awake, alert, and oriented x 3. No acute distress. Respiratory:  . No wheezes, rales, or rhonchi.  . No increased work of breathing. . No tactile fremitus. Cardiovascular:  . Regular rate and rhythm. No murmurs, ectopy, or gallups. . No LE extremity edema   . Normal pedal pulses Abdomen:  . Abdomen appears normal; no tenderness or masses . Soft, non-tender, non-distended. . No hernias . No HSM Musculoskeletal:  . Digits/nails BUE: no clubbing, cyanosis, petechiae, infection .  exam of joints, bones, muscles of at least one of following: head/neck, RUE, LUE, RLE, LLE   o strength and tone normal, no atrophy, no abnormal movements o No  tenderness, masses o Normal ROM, no contractures  Skin:  . No rashes, lesions, ulcers . palpation of skin: no induration or nodules Neurologic:  . CN 2-12 intact . Sensation all 4 extremities intact Psychiatric:  . Mental status o Mood, affect appropriate o Orientation to person, place, time  . judgment and insight appear intact    I have personally reviewed the following:   . CXR, chemistry, CBC  I have seen and examined this patient myself. I have spent 32 minutes in his evaluation an care.  Scheduled Meds: . budesonide  0.5 mg Nebulization BID  . digoxin  0.0625 mg Oral Daily  . diltiazem  240 mg Oral Daily  . insulin aspart  0-15 Units Subcutaneous TID WC  . NON FORMULARY 2 puff  2 puff Inhalation Daily  . pantoprazole  40 mg Oral Daily  . pravastatin  40 mg Oral QPM  . warfarin  2.5 mg Oral Q M,W,F-1800  . [START ON 10/27/2018] warfarin  5 mg Oral Q T,Th,S,Su-1800  . Warfarin - Physician Dosing Inpatient   Does not apply q1800   Continuous Infusions: . sodium chloride 10 mL/hr (10/24/2018 1601)  . azithromycin 500 mg (10/26/18 1340)  . cefTRIAXone (ROCEPHIN)  IV 1 g (10/26/18 1206)    Principal Problem:   Community acquired pneumonia of left lower lobe of lung ([place]) Active Problems:   CAD (coronary artery disease)   COPD (chronic obstructive pulmonary disease) with emphysema (HCC)   Atrial fibrillation (HCC)   Pulmonary hypertension (HCC)   Chronic respiratory failure with hypoxia (HCC)   DM2 (diabetes mellitus, type 2) (HCC)   Mass of lower lobe of left lung   CKD (chronic kidney disease) stage 3, GFR 30-59 ml/min (HCC)   LLL pneumonia (HCC)   (HFpEF) heart failure with preserved ejection fraction ([place])   LOS: 1 day

## 2018-10-26 NOTE — Care Management Note (Addendum)
Case Management Note  Patient Details  Name: [name] MRN: [id number] Date of Birth: 15-Nov-1935  Subjective/Objective:  Patient is from home with wife., palliative care consulted, wife states she spoke with someone about home hospice, she would like [facility] if he does go home with hospice, he has a rolling walker and a bsc.              Action/Plan: NCM will follow for transition of care needs.   Expected Discharge Date:                  Expected Discharge Plan:  Home w Hospice Care  In-House Referral:     Discharge planning Services  CM Consult  Post Acute Care Choice:    Choice offered to:     DME Arranged:    DME Agency:     HH Arranged:    [place] Agency:     Status of Service:  Completed, signed off  If discussed at [employer] of Stay Meetings, dates discussed:    Additional Comments:  Zenon Mayo, RN 10/26/2018, 2:12 PM

## 2018-10-26 NOTE — Progress Notes (Signed)
Patient had 2.49 pause. Alger Memos, NP notified. No new orders received. Will continue to monitor

## 2018-10-26 NOTE — Progress Notes (Addendum)
ANTICOAGULATION CONSULT NOTE - Initial Consult  Pharmacy Consult for Warfarin Indication: atrial fibrillation No Known Allergies  Patient Measurements: Height: 5\' 8"  (172.7 cm) Weight: 176 lb 9.6 oz (80.1 kg) IBW/kg (Calculated) : 68.4  Vital Signs: BP: 129/74 (02/07 0821) Pulse Rate: 71 (02/07 0821)  Labs: Recent Labs    11/14/2018 1208 10/26/18 0013  HGB 10.8*  --   HCT 34.9*  --   PLT 160  --   LABPROT 24.9*  --   INR 2.29  --   CREATININE 1.38* 1.17   Estimated Creatinine Clearance: 48.7 mL/min (by C-G formula based on SCr of 1.17 mg/dL).  Assessment: 69 yoM presenting with SOB, h/o paroxysmal afib and mitral valve repair in 2006. Pharmacy consulted to continue warfarin dosing upon admission. INR within goal at 2.29 at admit, last dose 2/6 AM. Hgb 10.8, pltc WNL. No bleeding noted. Noted interaction with azithromycin started 2/6 - may increase warfarin effect.  PTA warfarin dose: 5mg  SuTR, 2.5mg  all other days per last clinic note on 10/17/18 (admit INR 2.29)  Goal of Therapy:  INR 2-3 Monitor platelets by anticoagulation protocol: Yes   Plan:  Warfarin 2.5mg  PO x1 Daily INR Monitor s/sx of bleeding  Erin N. Gerarda Fraction, PharmD, [place] PGY2 Infectious Diseases Pharmacy Resident Phone: [phone] 10/26/2018,3:14 PM

## 2018-10-27 LAB — PROTIME-INR
INR: 2.25
Prothrombin Time: 24.5 seconds — ABNORMAL HIGH (ref 11.4–15.2)

## 2018-10-27 LAB — CBC WITH DIFFERENTIAL/PLATELET
Abs Immature Granulocytes: 0.19 10*3/uL — ABNORMAL HIGH (ref 0.00–0.07)
Basophils Absolute: 0 10*3/uL (ref 0.0–0.1)
Basophils Relative: 0 %
Eosinophils Absolute: 0 10*3/uL (ref 0.0–0.5)
Eosinophils Relative: 0 %
HCT: 30.7 % — ABNORMAL LOW (ref 39.0–52.0)
Hemoglobin: 9.5 g/dL — ABNORMAL LOW (ref 13.0–17.0)
IMMATURE GRANULOCYTES: 2 %
Lymphocytes Relative: 13 %
Lymphs Abs: 1.2 10*3/uL (ref 0.7–4.0)
MCH: 26.2 pg (ref 26.0–34.0)
MCHC: 30.9 g/dL (ref 30.0–36.0)
MCV: 84.6 fL (ref 80.0–100.0)
Monocytes Absolute: 0.6 10*3/uL (ref 0.1–1.0)
Monocytes Relative: 6 %
NEUTROS ABS: 7.1 10*3/uL (ref 1.7–7.7)
Neutrophils Relative %: 79 %
Platelets: 134 10*3/uL — ABNORMAL LOW (ref 150–400)
RBC: 3.63 MIL/uL — ABNORMAL LOW (ref 4.22–5.81)
RDW: 16 % — ABNORMAL HIGH (ref 11.5–15.5)
WBC: 9.1 10*3/uL (ref 4.0–10.5)
nRBC: 0.2 % (ref 0.0–0.2)

## 2018-10-27 LAB — BASIC METABOLIC PANEL
ANION GAP: 13 (ref 5–15)
BUN: 22 mg/dL (ref 8–23)
CO2: 26 mmol/L (ref 22–32)
Calcium: 8.5 mg/dL — ABNORMAL LOW (ref 8.9–10.3)
Chloride: 100 mmol/L (ref 98–111)
Creatinine, Ser: 1.09 mg/dL (ref 0.61–1.24)
GFR calc Af Amer: >60 mL/min (ref >60)
GFR calc non Af Amer: >60 mL/min (ref >60)
Glucose, Bld: 145 mg/dL — ABNORMAL HIGH (ref 70–99)
POTASSIUM: 4.1 mmol/L (ref 3.5–5.1)
Sodium: 139 mmol/L (ref 135–145)

## 2018-10-27 LAB — GLUCOSE, CAPILLARY
GLUCOSE-CAPILLARY: 99 mg/dL (ref 70–99)
GLUCOSE-CAPILLARY: 105 mg/dL — AB (ref 70–99)
Glucose-Capillary: 130 mg/dL — ABNORMAL HIGH (ref 70–99)
Glucose-Capillary: 153 mg/dL — ABNORMAL HIGH (ref 70–99)

## 2018-10-27 MED ORDER — SODIUM CHLORIDE 0.9% FLUSH
3.0000 mL | Freq: Two times a day (BID) | INTRAVENOUS | Status: DC
  Administered 2018-10-27 – 2018-10-31 (×8): 3 mL via INTRAVENOUS

## 2018-10-27 MED ORDER — SODIUM CHLORIDE 0.9% FLUSH: 3.0000 mL | INTRAVENOUS | Status: DC | PRN

## 2018-10-27 MED ORDER — IPRATROPIUM-ALBUTEROL 0.5-2.5 (3) MG/3ML IN SOLN
3.0000 mL | RESPIRATORY_TRACT | Status: DC | PRN
  Administered 2018-10-29 (×2): 3 mL via RESPIRATORY_TRACT
  Filled 2018-10-27 (×2): qty 3

## 2018-10-27 MED ORDER — WARFARIN SODIUM 2.5 MG PO TABS
2.5000 mg | ORAL_TABLET | Freq: Once | ORAL | Status: AC
  Administered 2018-10-27: 2.5 mg via ORAL
  Filled 2018-10-27: qty 1

## 2018-10-27 NOTE — Progress Notes (Signed)
Shaved patient face, patient is satisfied, no needs

## 2018-10-27 NOTE — Progress Notes (Signed)
ANTICOAGULATION CONSULT NOTE - Follow-Up Consult  Pharmacy Consult for Warfarin Indication: atrial fibrillation No Known Allergies  Patient Measurements: Height: 5\' 8"  (172.7 cm) Weight: 176 lb 9.6 oz (80.1 kg) IBW/kg (Calculated) : 68.4  Vital Signs: Temp: 97.3 F (36.3 C) (02/08 0836) Temp Source: Oral (02/08 0836) BP: 114/68 (02/08 0836) Pulse Rate: 102 (02/08 0836)  Labs: Recent Labs    10/27/2018 1208 10/26/18 0013 10/27/18 0315  HGB 10.8*  --  9.5*  HCT 34.9*  --  30.7*  PLT 160  --  134*  LABPROT 24.9*  --  24.5*  INR 2.29  --  2.25  CREATININE 1.38* 1.17 1.09   Estimated Creatinine Clearance: 52.3 mL/min (by C-G formula based on SCr of 1.09 mg/dL).  Assessment: 55 yoM presenting with SOB, h/o paroxysmal afib and mitral valve repair in 2006. Pharmacy consulted to continue warfarin dosing upon admission.   PTA warfarin dose: 5mg  Sun/Tues/Thurs, 2.5mg  all other days per last clinic note on 10/17/18 (admit INR 2.29)  INR today remains therapeutic (INR 2.25 << 2.29, goal of 2-3). CBC slight drop - no bleeding noted at this time. Noted interaction with azithromycin started 2/6 - may increase warfarin effect, will monitor closely.  Goal of Therapy:  INR 2-3 Monitor platelets by anticoagulation protocol: Yes   Plan:  - Warfarin 2.5 mg x 1 dose at 1800 today - Will continue to monitor for any signs/symptoms of bleeding and will follow up with PT/INR in the a.m.   Thank you for allowing pharmacy to be a part of this patient's care.  Alycia Rossetti, PharmD, BCPS Clinical Pharmacist Clinical phone for 10/27/2018: [phone] 10/27/2018 11:19 AM   **Pharmacist phone directory can now be found on [email or web address] ([email or web address]).  Listed under [place].

## 2018-10-27 NOTE — Progress Notes (Signed)
PROGRESS NOTE  [name] CVE:[id number] DOB: 1936/06/07 DOA: 10/28/2018 PCP: Tammi Sou, MD  Brief History   The patient is an 83 yr old man who carries a past medical history significant for mitral valve repair in 2006, history of embolic CVA, paroxysmal atrial fibrillation, hypertension, hyperlipidemia, CKD 3, COPD with chronic respiratory failure on home O2 and chronic steroids, CAD, and DM II. The patient presented with complaints of cough productive of yellow sputum and shortness of breath. He denies fevers, chills, nausea, vomiting or diarrhea. He denies chest pain or pleuritic pain. He is saturating in the low to mid nineties on his baseline FIO2 of 3L of O2. His symptoms began on 10/29/2018.   ED evaluation demonstrated CXR consistent with LLL and mild right pneumonia.   The patient has been admitted to a medical bed. He is receiving IV antibiotics, supplemental O2, nd nebulizer treatments.   A & P  Community-acquired pneumonia of the left lower lobe of the lungs: Patient has started on medications for community-acquired pneumonia he did have a brief hospital stay for pneumonia last December. The patient has been started on empiric azithromycin and rocephin. Repeat CXR demonstrated mild vascular congestion and interstitial edema that is slightly increased from prior exam. Respiratory viral panel was positive for Rhinovirus/Enterovirus. Will check procalcitonin and plan to discontinue antibiotics if negative.   CHFpEF: Echocardiogram performed 07/2018 demonstrated and EF of 50-55%. Study was not technically sufficient to evaluate the patient for diastolic dysfunction. Will continue patient on home dose of Demadex. Will adjust according to the results of procalcitonin. BNP is 95.3.  Chronic respiratory failure with hypoxia: We will continue home oxygen supplementation.  Monitor for desaturations. The patient is currently saturating at 95% on room air.  Coronary artery disease:  Noted continue home medications. Check troponins.  COPD with emphysema: Currently not wheezing.  Do not think patient requires steroids at this point.  We will continue home nebulizers.  Atrial fibrillation: the patient will be continued on his home dose of Diltiazem. Pharmacy to dose coumadin.  Pulmonary hypertension: Noted on echocardiogram last admission.  We will continue to monitor.  Continue present management.  Diabetes type 2: We will place on diabetes protocol. His glucoses are being followed with FSBS and SSI.  Chronic kidney disease stage III: Noted will avoid nephrotoxic agents.  Mass of the lower lobe of the left lung: Being monitored by his primary care physicians.  DVT prophylaxis: Heparin. Coumadin being started by pharmacy. Code Status: DNR Family Communication: Emergency contact is Abiel Antrim (spouse), [phone] Disposition Plan: tbd   Cederic Mozley, DO [facility] Direct contact: see [email or web address]  7PM-7AM contact night coverage as above 10/27/2018, 1:26 PM  LOS: 2 days   Antibiotics  . Rocephin and azithromycin  Interval History/Subjective  The patient is resting comfortably. No new complaints.  Objective   Vitals:  Vitals:   10/27/18 0805 10/27/18 0836  BP:  114/68  Pulse: 81 (!) 102  Resp: 18 18  Temp:  (!) 97.3 F (36.3 C)  SpO2: 98% 95%    Exam:  Constitutional:  . The patient is awake, alert, and oriented x 3. No acute distress. Respiratory:  . No wheezes, rales, or rhonchi.  . No increased work of breathing. . No tactile fremitus. Cardiovascular:  . Regular rate and rhythm. No murmurs, ectopy, or gallups. . No LE extremity edema   . Normal pedal pulses Abdomen:  . Abdomen appears normal; no tenderness or masses . Soft, non-tender, non-distended. Marland Kitchen  No hernias . No HSM Musculoskeletal:  . Digits/nails BUE: no clubbing, cyanosis, petechiae, infection . exam of joints, bones, muscles of at least one of following:  head/neck, RUE, LUE, RLE, LLE   o strength and tone normal, no atrophy, no abnormal movements o No tenderness, masses o Normal ROM, no contractures  Skin:  . No rashes, lesions, ulcers . palpation of skin: no induration or nodules Neurologic:  . CN 2-12 intact . Sensation all 4 extremities intact Psychiatric:  . Mental status o Mood, affect appropriate o Orientation to person, place, time  . judgment and insight appear intact    I have personally reviewed the following:   . CXR, chemistry, CBC  I have seen and examined this patient myself. I have spent 32 minutes in his evaluation an care.  Scheduled Meds: . budesonide  0.5 mg Nebulization BID  . digoxin  0.0625 mg Oral Daily  . diltiazem  240 mg Oral Daily  . insulin aspart  0-15 Units Subcutaneous TID WC  . pantoprazole  40 mg Oral Daily  . pravastatin  40 mg Oral QPM  . sodium chloride flush  3 mL Intravenous Q12H  . torsemide  20 mg Oral Daily  . umeclidinium-vilanterol  1 puff Inhalation QPC breakfast  . warfarin  2.5 mg Oral ONCE-1800  . Warfarin - Pharmacist Dosing Inpatient   Does not apply q1800   Continuous Infusions: . sodium chloride 10 mL/hr (10/21/2018 1601)  . azithromycin 500 mg (10/26/18 1340)  . cefTRIAXone (ROCEPHIN)  IV 1 g (10/26/18 1206)    Principal Problem:   Community acquired pneumonia of left lower lobe of lung ([place]) Active Problems:   CAD (coronary artery disease)   COPD (chronic obstructive pulmonary disease) with emphysema (HCC)   Atrial fibrillation (HCC)   Pulmonary hypertension (HCC)   Chronic respiratory failure with hypoxia (HCC)   DM2 (diabetes mellitus, type 2) (HCC)   Mass of lower lobe of left lung   CKD (chronic kidney disease) stage 3, GFR 30-59 ml/min (HCC)   LLL pneumonia (HCC)   (HFpEF) heart failure with preserved ejection fraction ([place])   LOS: 2 days

## 2018-10-28 ENCOUNTER — Inpatient Hospital Stay (HOSPITAL_COMMUNITY): Payer: Medicare Other

## 2018-10-28 LAB — GLUCOSE, CAPILLARY
GLUCOSE-CAPILLARY: 175 mg/dL — AB (ref 70–99)
Glucose-Capillary: 117 mg/dL — ABNORMAL HIGH (ref 70–99)
Glucose-Capillary: 179 mg/dL — ABNORMAL HIGH (ref 70–99)
Glucose-Capillary: 143 mg/dL — ABNORMAL HIGH (ref 70–99)

## 2018-10-28 LAB — CULTURE, RESPIRATORY W GRAM STAIN

## 2018-10-28 LAB — PROTIME-INR
INR: 1.87
Prothrombin Time: 21.3 seconds — ABNORMAL HIGH (ref 11.4–15.2)

## 2018-10-28 LAB — CULTURE, RESPIRATORY: CULTURE: NORMAL

## 2018-10-28 MED ORDER — SODIUM CHLORIDE 0.9 % IV BOLUS
500.0000 mL | Freq: Once | INTRAVENOUS | Status: AC
  Administered 2018-10-28: 500 mL via INTRAVENOUS

## 2018-10-28 MED ORDER — WARFARIN SODIUM 5 MG PO TABS
5.0000 mg | ORAL_TABLET | Freq: Once | ORAL | Status: AC
  Administered 2018-10-28: 5 mg via ORAL
  Filled 2018-10-28: qty 1

## 2018-10-28 NOTE — Evaluation (Addendum)
Physical Therapy Evaluation Patient Details Name: [name] MRN: [id number] DOB: Jan 15, 1936 Today's Date: 10/28/2018   History of Present Illness  The patient is an 83 yr old man who carries a past medical history significant for mitral valve repair in 2006, history of embolic CVA, paroxysmal atrial fibrillation, hypertension, hyperlipidemia, CKD 3, COPD with chronic respiratory failure on home O2 and chronic steroids, CAD, and DM II. The patient presented with complaints of cough productive of yellow sputum and shortness of breath    Clinical Impression  Pt admitted with above diagnosis. Pt currently with functional limitations due to the deficits listed below (see PT Problem List). PTA pt living at home with wife and son who works, ambulating home without AD wearing 3L O2, son and patient deny falls. Today, pt anxious and urgent to use BR limited session, ambulating to BR with RW and poor safety. Discussed options for more supervision such as SNF or assisted living, patients son states they'd like to [place] for as long as possible and he has no concerns for his well being.  Rec SNF but pt declining.  Will need to progress mobility and independence next session. Son states pt is ambulating near baseline. Pt will benefit from skilled PT to increase their independence and safety with mobility to allow discharge to the venue listed below.    Patient de-sat to 80% on 4L with short distance gait. (10'), RN aware.     Follow Up Recommendations Home health PT Supervision for OOB mobility. (Patient and son are Declining SNF)    Equipment Recommendations  None recommended by PT    Recommendations for Other Services       Precautions / Restrictions Precautions Precautions: None Restrictions Weight Bearing Restrictions: No      Mobility  Bed Mobility Overal bed mobility: Independent                Transfers Overall transfer level: Modified  independent                  Ambulation/Gait Ambulation/Gait assistance: Supervision;Min guard Gait Distance (Feet): 20 Feet Assistive device: Rolling walker (2 wheeled) Gait Pattern/deviations: Step-to pattern;Step-through pattern Gait velocity: decreased   General Gait Details: pt with poor safety awareness with RW, per son does not use one at home but holds onto fruniture. mild unsteady and slow gait.   Stairs            Wheelchair Mobility    Modified Rankin (Stroke Patients Only)       Balance                                             Pertinent Vitals/Pain Pain Assessment: No/denies pain    Home Living Family/patient expects to be discharged to:: Private residence Living Arrangements: Spouse/significant other Available Help at Discharge: Family;Available 24 hours/day Type of Home: House Home Access: Level entry     Home Layout: Two level;Full bath on main level Home Equipment: Walker - 2 wheels;Wheelchair - manual      Prior Function Level of Independence: Independent               Hand Dominance        Extremity/Trunk Assessment   Upper Extremity Assessment Upper Extremity Assessment: Overall WFL for tasks assessed    Lower Extremity Assessment  Lower Extremity Assessment: Overall WFL for tasks assessed       Communication   Communication: No difficulties  Cognition Arousal/Alertness: Awake/alert Behavior During Therapy: WFL for tasks assessed/performed;Anxious Overall Cognitive Status: Within Functional Limits for tasks assessed                                        General Comments      Exercises     Assessment/Plan    PT Assessment Patient needs continued PT services  PT Problem List Decreased strength       PT Treatment Interventions DME instruction;Gait training;Stair training;Functional mobility training;Therapeutic activities;Therapeutic exercise;Balance training    PT  Goals (Current goals can be found in the Care Plan section)  Acute Rehab PT Goals Patient Stated Goal: to go the bathroom PT Goal Formulation: With patient Time For Goal Achievement: 11/11/18 Potential to Achieve Goals: Fair    Frequency Min 3X/week   Barriers to discharge        Co-evaluation               AM-PAC PT "6 Clicks" Mobility  Outcome Measure Help needed turning from your back to your side while in a flat bed without using bedrails?: None Help needed moving from lying on your back to sitting on the side of a flat bed without using bedrails?: None Help needed moving to and from a bed to a chair (including a wheelchair)?: A Little Help needed standing up from a chair using your arms (e.g., wheelchair or bedside chair)?: A Little Help needed to walk in hospital room?: A Little Help needed climbing 3-5 steps with a railing? : A Lot 6 Click Score: 19    End of Session Equipment Utilized During Treatment: Gait belt;Oxygen(4L) Activity Tolerance: Patient tolerated treatment well Patient left: in bed Nurse Communication: Mobility status PT Visit Diagnosis: Unsteadiness on feet (R26.81)    Time: 1000-1023 PT Time Calculation (min) (ACUTE ONLY): 23 min   Charges:   PT Evaluation $PT Eval Low Complexity: 1 Low PT Treatments $Gait Training: 8-22 mins        Reinaldo Berber, PT, DPT Acute Rehabilitation Services Pager: [phone] Office: [phone]    Reinaldo Berber 10/28/2018, 10:36 AM

## 2018-10-28 NOTE — Progress Notes (Addendum)
PROGRESS NOTE  [name] BPZ:[id number] DOB: 07-02-1936 DOA: 11/01/2018 PCP: Tammi Sou, MD  Brief History   The patient is an 83 yr old man who carries a past medical history significant for mitral valve repair in 2006, history of embolic CVA, paroxysmal atrial fibrillation, hypertension, hyperlipidemia, CKD 3, COPD with chronic respiratory failure on home O2 and chronic steroids, CAD, and DM II. The patient presented with complaints of cough productive of yellow sputum and shortness of breath. He denies fevers, chills, nausea, vomiting or diarrhea. He denies chest pain or pleuritic pain. He is saturating in the low to mid nineties on his baseline FIO2 of 3L of O2. His symptoms began on 11/14/2018.   ED evaluation demonstrated CXR consistent with LLL and mild right pneumonia.   The patient has been admitted to a medical bed. He is receiving IV antibiotics, supplemental O2, and nebulizer treatments.   A & P  Community-acquired pneumonia of the left lower lobe of the lungs: Patient has started on medications for community-acquired pneumonia he did have a brief hospital stay for pneumonia last December. The patient has been started on empiric azithromycin and rocephin. Repeat CXR demonstrated mild vascular congestion and interstitial edema that is slightly increased from prior exam. Respiratory viral panel was positive for Rhinovirus/Enterovirus. Will check procalcitonin and plan to discontinue antibiotics if negative.   CHFpEF: Echocardiogram performed 07/2018 demonstrated and EF of 50-55%. Study was not technically sufficient to evaluate the patient for diastolic dysfunction. Will continue patient on home dose of Demadex. Will adjust according to the results of procalcitonin. BNP is 95.3.  Chronic respiratory failure with hypoxia: We will continue home oxygen supplementation.  Monitor for desaturations. The patient is currently saturating at 95% on room air.  Coronary artery disease:  Noted continue home medications. Check troponins.  COPD with emphysema: Currently not wheezing.  Do not think patient requires steroids at this point.  We will continue home nebulizers.  Atrial fibrillation: the patient will be continued on his home dose of Diltiazem. Pharmacy to dose coumadin.  Pulmonary hypertension: Noted on echocardiogram last admission.  We will continue to monitor.  Continue present management.  Diabetes type 2: We will place on diabetes protocol. His glucoses are being followed with FSBS and SSI.  Chronic kidney disease stage III: Noted will avoid nephrotoxic agents.  Mass of the lower lobe of the left lung: Being monitored by his primary care physicians.  DVT prophylaxis: Heparin. Coumadin being started by pharmacy. Code Status: DNR Family Communication: Emergency contact is [name] (spouse), [phone] Disposition Plan: tbd   Shaun Higginbotham, DO [facility] Direct contact: see [email or web address]  7PM-7AM contact night coverage as above 10/28/2018, 10:59 AM  LOS: 3 days   Antibiotics  . Rocephin and azithromycin  Interval History/Subjective  The patient is resting comfortably. No new complaints.  Objective   Vitals:  Vitals:   10/28/18 0745 10/28/18 0748  BP: (!) 118/59   Pulse: (!) 101 99  Resp: 16 20  Temp: 97.6 F (36.4 C)   SpO2: (!) 89% 91%    Exam:  Constitutional:  . The patient is awake, alert, and oriented x 3. He is tearful. He wants a pork sausage biscuit for breakfast. For some reason he is extremely apologetic. I have changed him to a regular diet. The patient states that he is very anxious and that he thinks he needs some ativan. Respiratory:  . No wheezes, rales, or rhonchi.  . No increased work of breathing. Marland Kitchen  No tactile fremitus. Cardiovascular:  . Regular rate and rhythm. No murmurs, ectopy, or gallups. . No LE extremity edema   . Normal pedal pulses Abdomen:  . Abdomen appears normal; no tenderness or  masses . Soft, non-tender, non-distended. . No hernias . No HSM Musculoskeletal:  . Digits/nails BUE: no clubbing, cyanosis, petechiae, infection . exam of joints, bones, muscles of at least one of following: head/neck, RUE, LUE, RLE, LLE   o strength and tone normal, no atrophy, no abnormal movements o No tenderness, masses o Normal ROM, no contractures  Skin:  . No rashes, lesions, ulcers . palpation of skin: no induration or nodules Neurologic:  . CN 2-12 intact . Sensation all 4 extremities intact Psychiatric:  . Mental status o Mood, affect appropriate o Orientation to person, place, time  . judgment and insight appear intact    I have personally reviewed the following:   . CXR, chemistry, CBC  I have seen and examined this patient myself. I have spent 32 minutes in his evaluation an care.  Scheduled Meds: . budesonide  0.5 mg Nebulization BID  . digoxin  0.0625 mg Oral Daily  . diltiazem  240 mg Oral Daily  . insulin aspart  0-15 Units Subcutaneous TID WC  . pantoprazole  40 mg Oral Daily  . pravastatin  40 mg Oral QPM  . sodium chloride flush  3 mL Intravenous Q12H  . torsemide  20 mg Oral Daily  . umeclidinium-vilanterol  1 puff Inhalation QPC breakfast  . Warfarin - Pharmacist Dosing Inpatient   Does not apply q1800   Continuous Infusions: . sodium chloride 10 mL/hr (11/11/2018 1601)  . azithromycin Stopped (10/27/18 1900)  . cefTRIAXone (ROCEPHIN)  IV Stopped (10/27/18 2348)    Principal Problem:   Community acquired pneumonia of left lower lobe of lung ([place]) Active Problems:   CAD (coronary artery disease)   COPD (chronic obstructive pulmonary disease) with emphysema (HCC)   Atrial fibrillation (HCC)   Pulmonary hypertension (HCC)   Chronic respiratory failure with hypoxia (HCC)   DM2 (diabetes mellitus, type 2) (HCC)   Mass of lower lobe of left lung   CKD (chronic kidney disease) stage 3, GFR 30-59 ml/min (HCC)   LLL pneumonia (HCC)   (HFpEF)  heart failure with preserved ejection fraction ([place])   LOS: 3 days

## 2018-10-28 NOTE — Evaluation (Addendum)
Occupational Therapy Evaluation Patient Details Name: [name] MRN: [id number] DOB: 1936-05-10 Today's Date: 10/28/2018    History of Present Illness The patient is an 83 yr old man who carries a past medical history significant for mitral valve repair in 2006, history of embolic CVA, paroxysmal atrial fibrillation, hypertension, hyperlipidemia, CKD 3, COPD with chronic respiratory failure on home O2 and chronic steroids, CAD, and DM II. The patient presented with complaints of cough productive of yellow sputum and shortness of breath   Clinical Impression   PTA patient reports independent with mobility, but requires some assist with ADLs (LB dressing/bathing, clothing mgmt during toileting).  He was admitted for above and limited by problem list below, including decreased activity tolerance, impaired safety awareness/decreased awareness of deficits, generalized weakness.  Pt able to complete bed mobility with supervision (given increased time and effort), BSC transfers with min guard for safety, and toileting required total assist (as patient incontinent of BM in bed).  Noted SOB with mobility, pt on 4L supplemental oxygen via [place]. Patient will benefit from continued OT services while admitted and recommend SNF rehab and 24/7 assistance at discharge.  Patient declining SNF, and reports he will dc home with his spouses support (although she is not able to assist much); if declines SNF will need maximal HH services.     Follow Up Recommendations  SNF;Supervision/Assistance - 24 hour    Equipment Recommendations  3 in 1 bedside commode    Recommendations for Other Services       Precautions / Restrictions Precautions Precautions: None Restrictions Weight Bearing Restrictions: No      Mobility Bed Mobility Overal bed mobility: Needs Assistance Bed Mobility: Supine to Sit;Sit to Supine     Supine to sit: Supervision Sit to supine: Supervision   General bed mobility comments:  increased time and effort  Transfers Overall transfer level: Needs assistance Equipment used: None Transfers: Sit to/from [employer] Transfers Sit to Stand: Supervision Stand pivot transfers: Min guard       General transfer comment: sit to stand with supervision and min guard during pivot for safeyt     Balance Overall balance assessment: Mild deficits observed, not formally tested                                         ADL either performed or assessed with clinical judgement   ADL Overall ADL's : Needs assistance/impaired Eating/Feeding: Set up;Sitting   Grooming: Set up;Sitting   Upper Body Bathing: Set up;Sitting   Lower Body Bathing: Maximal assistance;Sit to/from stand   Upper Body Dressing : Minimal assistance;Sitting   Lower Body Dressing: Maximal assistance;Sit to/from stand   Toilet Transfer: Min guard;Stand-pivot;BSC   Toileting- Water quality scientist and Hygiene: Total assistance;Sit to/from stand Toileting - Clothing Manipulation Details (indicate cue type and reason): total assist after incontient of BM in bed      Functional mobility during ADLs: Min guard General ADL Comments: patient limited by decreased activity tolerance and generalized weakness      Vision   Vision Assessment?: No apparent visual deficits     Perception     Praxis      Pertinent Vitals/Pain Pain Assessment: No/denies pain     Hand Dominance Right   Extremity/Trunk Assessment Upper Extremity Assessment Upper Extremity Assessment: Generalized weakness   Lower Extremity Assessment Lower Extremity Assessment: Defer to PT evaluation  Communication Communication Communication: No difficulties   Cognition Arousal/Alertness: Awake/alert Behavior During Therapy: WFL for tasks assessed/performed;Anxious Overall Cognitive Status: Impaired/Different from baseline Area of Impairment: Safety/judgement;Awareness;Problem solving                          Safety/Judgement: Decreased awareness of safety;Decreased awareness of deficits Awareness: Emergent Problem Solving: Requires verbal cues General Comments: patient oriented. able to follow commands but decreased safety awareness and awareness to deficits    General Comments  son present     Exercises     Shoulder Instructions      Home Living Family/patient expects to be discharged to:: Private residence Living Arrangements: Spouse/significant other Available Help at Discharge: Family;Available 24 hours/day Type of Home: House Home Access: Level entry     Home Layout: Two level;Full bath on main level     Bathroom Shower/Tub: (basin bathes )   Bathroom Toilet: Handicapped height     Home Equipment: Environmental consultant - 2 wheels;Wheelchair - manual          Prior Functioning/Environment Level of Independence: Needs assistance  Gait / Transfers Assistance Needed: independent with mobility  ADL's / Homemaking Assistance Needed: reports needs assist with ADLs (clothing mgmt during toileting, LB dressing, bathing)            OT Problem List: Decreased strength;Decreased activity tolerance;Decreased safety awareness;Decreased knowledge of use of DME or AE;Decreased knowledge of precautions;Cardiopulmonary status limiting activity      OT Treatment/Interventions: Self-care/ADL training;DME and/or AE instruction;Therapeutic activities;Patient/family education;Balance training;Energy conservation;Therapeutic exercise    OT Goals(Current goals can be found in the care plan section) Acute Rehab OT Goals Patient Stated Goal: to get home  OT Goal Formulation: With patient Time For Goal Achievement: 11/11/18 Potential to Achieve Goals: Fair  OT Frequency: Min 2X/week   Barriers to D/C:            Co-evaluation              AM-PAC OT "6 Clicks" Daily Activity     Outcome Measure Help from another person eating meals?: None Help from another person taking  care of personal grooming?: A Little Help from another person toileting, which includes using toliet, bedpan, or urinal?: Total Help from another person bathing (including washing, rinsing, drying)?: A Lot Help from another person to put on and taking off regular upper body clothing?: A Little Help from another person to put on and taking off regular lower body clothing?: A Lot 6 Click Score: 15   End of Session Equipment Utilized During Treatment: Oxygen Nurse Communication: Mobility status  Activity Tolerance: Patient tolerated treatment well Patient left: in bed;with call bell/phone within reach;with family/visitor present  OT Visit Diagnosis: Other abnormalities of gait and mobility (R26.89);Muscle weakness (generalized) (M62.81)                Time: 1308-6578 OT Time Calculation (min): 22 min Charges:  OT General Charges $OT Visit: 1 Visit OT Evaluation $OT Eval Moderate Complexity: [place], OT Acute Rehabilitation Services Pager [phone] Office [phone]   Delight Stare 10/28/2018, 12:25 PM

## 2018-10-28 NOTE — Progress Notes (Signed)
ANTICOAGULATION CONSULT NOTE - Follow-Up Consult  Pharmacy Consult for Warfarin Indication: atrial fibrillation No Known Allergies  Patient Measurements: Height: 5\' 8"  (172.7 cm) Weight: 176 lb 9.6 oz (80.1 kg) IBW/kg (Calculated) : 68.4  Vital Signs: Temp: 97.6 F (36.4 C) (02/09 0745) Temp Source: Oral (02/09 0745) BP: 118/59 (02/09 0745) Pulse Rate: 99 (02/09 0748)  Labs: Recent Labs    11/09/2018 1208 10/26/18 0013 10/27/18 0315 10/28/18 0736  HGB 10.8*  --  9.5*  --   HCT 34.9*  --  30.7*  --   PLT 160  --  134*  --   LABPROT 24.9*  --  24.5* 21.3*  INR 2.29  --  2.25 1.87  CREATININE 1.38* 1.17 1.09  --    Estimated Creatinine Clearance: 52.3 mL/min (by C-G formula based on SCr of 1.09 mg/dL).  Assessment: 41 yoM presenting with SOB, h/o paroxysmal afib and mitral valve repair in 2006. Pharmacy consulted to continue warfarin dosing upon admission.   PTA warfarin dose: 5mg  Sun/Tues/Thurs, 2.5mg  all other days per last clinic note on 10/17/18 (admit INR 2.29)  Noted interaction with azithromycin started 2/6 - may increase warfarin effect, will monitor closely.  INR slightly subtherapeutic today at 1.87, slight CBC drop noted yesterday, no new labs and no bleeding reported.  Goal of Therapy:  INR 2-3 Monitor platelets by anticoagulation protocol: Yes   Plan:  Warfarin 5mg  PO x 1 tonight Daily INR, s/s bleeding  Bertis Ruddy, PharmD Clinical Pharmacist Please check AMION for all [place] numbers 10/28/2018 11:00 AM

## 2018-10-29 ENCOUNTER — Encounter (HOSPITAL_COMMUNITY): Payer: Medicare Other

## 2018-10-29 ENCOUNTER — Encounter (HOSPITAL_COMMUNITY): Payer: Self-pay

## 2018-10-29 LAB — PROTIME-INR
INR: 1.81
Prothrombin Time: 20.7 seconds — ABNORMAL HIGH (ref 11.4–15.2)

## 2018-10-29 LAB — GLUCOSE, CAPILLARY
Glucose-Capillary: 138 mg/dL — ABNORMAL HIGH (ref 70–99)
Glucose-Capillary: 172 mg/dL — ABNORMAL HIGH (ref 70–99)
Glucose-Capillary: 187 mg/dL — ABNORMAL HIGH (ref 70–99)
Glucose-Capillary: 127 mg/dL — ABNORMAL HIGH (ref 70–99)

## 2018-10-29 MED ORDER — ALBUTEROL SULFATE (2.5 MG/3ML) 0.083% IN NEBU
2.5000 mg | INHALATION_SOLUTION | Freq: Four times a day (QID) | RESPIRATORY_TRACT | Status: DC
  Administered 2018-10-29 – 2018-10-30 (×5): 2.5 mg via RESPIRATORY_TRACT
  Filled 2018-10-29 (×5): qty 3

## 2018-10-29 MED ORDER — CEFDINIR 300 MG PO CAPS
300.0000 mg | ORAL_CAPSULE | Freq: Two times a day (BID) | ORAL | Status: DC
  Administered 2018-10-29: 300 mg via ORAL
  Filled 2018-10-29 (×2): qty 1

## 2018-10-29 MED ORDER — FUROSEMIDE 10 MG/ML IJ SOLN
40.0000 mg | Freq: Once | INTRAMUSCULAR | Status: AC
  Administered 2018-10-29: 40 mg via INTRAVENOUS

## 2018-10-29 MED ORDER — WARFARIN SODIUM 5 MG PO TABS
5.0000 mg | ORAL_TABLET | Freq: Once | ORAL | Status: AC
  Administered 2018-10-29: 5 mg via ORAL
  Filled 2018-10-29: qty 1

## 2018-10-29 MED ORDER — FUROSEMIDE 10 MG/ML IJ SOLN
INTRAMUSCULAR | Status: AC
  Filled 2018-10-29: qty 4

## 2018-10-29 NOTE — Progress Notes (Signed)
RN made aware of patient being tachypneic, labored using abdominal muscles. Coarse crackles noted throughout all lung fields. Increased O2 to 5 Lpm from 3 Lpm due to sats of 87%. RN informed me of patient just moving from chair to bed, as well as, having recently been given a diuretic. RT will continue to monitor.

## 2018-10-29 NOTE — Consult Note (Signed)
   [facility] CM Inpatient Consult   10/29/2018  KHADIM LUNDBERG [birth year] [id number]  Patient screened for high risk score and hospitalizations to check if potential [facility] services are needed . Patient was hospitalized for CAP, with a HX of COPD. HF.  Spoke with inpatient RNCM, Neoma Laming,  and she states patient is returning home with Hospice per his wife.  No [facility] Care Management needs as the patient will receive his community follow up in their program. For questions contact:   Natividad Brood, RN BSN [facility] Liaison  [phone] business mobile phone Toll free office [phone]

## 2018-10-29 NOTE — Progress Notes (Signed)
PROGRESS NOTE  [name] CHE:[id number] DOB: 07/02/1936 DOA: 11/06/2018 PCP: Tammi Sou, MD  Brief History   The patient is an 83 yr old man who carries a past medical history significant for mitral valve repair in 2006, history of embolic CVA, paroxysmal atrial fibrillation, hypertension, hyperlipidemia, CKD 3, COPD with chronic respiratory failure on home O2 and chronic steroids, CAD, and DM II. The patient presented with complaints of cough productive of yellow sputum and shortness of breath. He denies fevers, chills, nausea, vomiting or diarrhea. He denies chest pain or pleuritic pain. He is saturating in the low to mid nineties on his baseline FIO2 of 3L of O2. His symptoms began on 10/22/2018.   ED evaluation demonstrated CXR consistent with LLL and mild right pneumonia.   The patient has been admitted to a medical bed. He is receiving IV antibiotics, supplemental O2, and nebulizer treatments.   Initially the patient's wife had wanted the patient to come home with hospice. However, today she expresses concern that she may not be able to handle him at home. Social work to discuss with the patient's wife option of going home with palliative care instead as this may allow her to have home services other than hospice, or of admitting the patient to SNF with hospice instead. PT/OT have recommended this and mentioned that the patient would need max HH services if he is to return to home.  The patient's antibiotics have been de-escalated to oral omnicef. He has completed 5 days of azithromycin.   A & P  Community-acquired pneumonia of the left lower lobe of the lungs: Patient has started on medications for community-acquired pneumonia he did have a brief hospital stay for pneumonia last December. The patient has been started on empiric azithromycin and rocephin. Repeat CXR demonstrated mild vascular congestion and interstitial edema that is slightly increased from prior exam. Respiratory  viral panel was positive for Rhinovirus/Enterovirus.   CHFpEF: Echocardiogram performed 07/2018 demonstrated and EF of 50-55%. Study was not technically sufficient to evaluate the patient for diastolic dysfunction. Will continue patient on home dose of Demadex. Will adjust according to the results of procalcitonin. BNP is 95.3.  Chronic respiratory failure with hypoxia: We will continue home oxygen supplementation.  Monitor for desaturations. The patient is currently saturating at 91% on 5 liters. Will re-check chest x-ray. Baseline O2 requirements at home was 3L.  Coronary artery disease: Noted continue home medications. Check troponins.  COPD with emphysema: Currently not wheezing.  Do not think patient requires steroids at this point.  We will continue home nebulizers.  Atrial fibrillation: the patient will be continued on his home dose of Diltiazem. Pharmacy to dose coumadin.  Pulmonary hypertension: Noted on echocardiogram last admission.  We will continue to monitor.  Continue present management.  Diabetes type 2: We will place on diabetes protocol. His glucoses are being followed with FSBS and SSI.  Chronic kidney disease stage III: Noted will avoid nephrotoxic agents.  Mass of the lower lobe of the left lung: Being monitored by his primary care physicians.  DVT prophylaxis: Heparin. Coumadin being started by pharmacy. Code Status: DNR Family Communication: Emergency contact is Shamal Stracener (spouse), [phone] Disposition Plan: tbd   Skylor Schnapp, DO [facility] Direct contact: see [email or web address]  7PM-7AM contact night coverage as above 10/29/2018, 3:58 PM  LOS: 4 days   Antibiotics  . Omnicef.  Interval History/Subjective  The patient is resting comfortably. No new complaints.  Objective   Vitals:  Vitals:  10/29/18 1141 10/29/18 1520  BP:    Pulse:    Resp:    Temp:    SpO2: 94% 91%    Exam:  Constitutional:  . The patient is awake,  alert, and oriented x 3. No acute distress. He states that he is Respiratory:  . No wheezes, rales, or rhonchi. Diminished breath sounds bilaterally. . No increased work of breathing. . No tactile fremitus. Cardiovascular:  . Regular rate and rhythm. No murmurs, ectopy, or gallups. . No LE extremity edema   . Normal pedal pulses Abdomen:  . Abdomen appears normal; no tenderness or masses . Soft, non-tender, non-distended. . No hernias . No HSM Musculoskeletal:  . Digits/nails BUE: no clubbing, cyanosis, petechiae, infection . exam of joints, bones, muscles of at least one of following: head/neck, RUE, LUE, RLE, LLE   o strength and tone normal, no atrophy, no abnormal movements o No tenderness, masses o Normal ROM, no contractures  Skin:  . No rashes, lesions, ulcers . palpation of skin: no induration or nodules Neurologic:  . CN 2-12 intact . Sensation all 4 extremities intact Psychiatric:  . Mental status o Mood, affect appropriate o Orientation to person, place, time  . judgment and insight appear intact    I have personally reviewed the following:   . CXR, chemistry, CBC  I have seen and examined this patient myself. I have spent 35 minutes in his evaluation an care. I have discussed this patient with his wife and daughter at bedside at length. I have also coordinated care with case management and other team members.  Scheduled Meds: . albuterol  2.5 mg Nebulization QID  . budesonide  0.5 mg Nebulization BID  . digoxin  0.0625 mg Oral Daily  . diltiazem  240 mg Oral Daily  . insulin aspart  0-15 Units Subcutaneous TID WC  . pantoprazole  40 mg Oral Daily  . pravastatin  40 mg Oral QPM  . sodium chloride flush  3 mL Intravenous Q12H  . torsemide  20 mg Oral Daily  . umeclidinium-vilanterol  1 puff Inhalation QPC breakfast  . warfarin  5 mg Oral ONCE-1800  . Warfarin - Pharmacist Dosing Inpatient   Does not apply q1800   Continuous Infusions: . sodium chloride  10 mL/hr (10/21/2018 1601)  . azithromycin 500 mg (10/29/18 1318)  . cefTRIAXone (ROCEPHIN)  IV 1 g (10/29/18 1244)    Principal Problem:   Community acquired pneumonia of left lower lobe of lung ([place]) Active Problems:   CAD (coronary artery disease)   COPD (chronic obstructive pulmonary disease) with emphysema (HCC)   Atrial fibrillation (HCC)   Pulmonary hypertension (HCC)   Chronic respiratory failure with hypoxia (HCC)   DM2 (diabetes mellitus, type 2) (HCC)   Mass of lower lobe of left lung   CKD (chronic kidney disease) stage 3, GFR 30-59 ml/min (HCC)   LLL pneumonia (HCC)   (HFpEF) heart failure with preserved ejection fraction ([place])   LOS: 4 days

## 2018-10-29 NOTE — Progress Notes (Signed)
ANTICOAGULATION CONSULT NOTE - Follow-Up Consult  Pharmacy Consult for Coumadin Indication: atrial fibrillation  No Known Allergies  Patient Measurements: Height: 5\' 8"  (172.7 cm) Weight: 176 lb 9.6 oz (80.1 kg) IBW/kg (Calculated) : 68.4  Vital Signs: Temp: 97.8 F (36.6 C) (02/10 0758) Temp Source: Oral (02/10 0758) BP: 135/70 (02/10 0758) Pulse Rate: 107 (02/10 1133)  Labs: Recent Labs    10/27/18 0315 10/28/18 0736 10/29/18 0239  HGB 9.5*  --   --   HCT 30.7*  --   --   PLT 134*  --   --   LABPROT 24.5* 21.3* 20.7*  INR 2.25 1.87 1.81  CREATININE 1.09  --   --    Estimated Creatinine Clearance: 52.3 mL/min (by C-G formula based on SCr of 1.09 mg/dL).  Assessment: 35 yoM presenting with SOB, h/o paroxysmal afib and mitral valve repair in 2006. Pharmacy consulted to continue warfarin dosing upon admission.   PTA warfarin dose: 5mg  Sun/Tues/Thurs, 2.5mg  all other days per last clinic note on 10/17/18 (admit INR 2.29)    INR has trended down to 1.81, below target range. Has had usual doses of Coumadin 2.5 mg on Saturday and 5 mg on Sunday. Potential interaction with Azithromycin begun on 2/6 - may increase warfarin effect, but day # 5 Azithromycin, no increased Coumadin effect to date.  Goal of Therapy:  INR 2-3 Monitor platelets by anticoagulation protocol: Yes   Plan:   Coumadin 5 mg again today.  Daily PT/INR.  CBC in am.  Arty Baumgartner, [place] Pager: [phone] or phone: [phone] 10/29/2018 2:51 PM

## 2018-10-29 NOTE — Significant Event (Signed)
Rapid Response Event Note  Overview:  Called for Respiratory distress Time Called: 1145 Event Type: Respiratory  Initial Focused Assessment:  Called by Respiratory therapy to evaluate patient for respiratory distress and decreased sats.  Patient just received a neb treatment and was placed on NRB with sats now 100%.  On review of chart patient is a DNR with plans to be discharged home with hospice.  PAtietn is responsive, slightly labored with resps ~22, Breath sounds bilateral crackles heard with R>L,   Interventions:  Recommend RN to call MD to update on status.  MD updated and ordered Lasix to be given and to monitor,  Plan of Care (if not transferred): Patient to remain on floor on NRB and RN to monitor patient.  Event Summary:  Family at bedside and I updated him on plan of care    at      at          [facility]

## 2018-10-29 NOTE — Progress Notes (Signed)
RT Note:  RN said patient sats were in 42s and she had placed a mask on patient believing that patient was breathing through mouth more.  Patient was laboring, using accessory muscles to breathe and tachypneic.  Coarse crackles can now be heard in upper bases.  Put patient on NRB for now and patient sats are upper 90s to 100%.  RT will continue to monitor.

## 2018-10-30 ENCOUNTER — Telehealth: Payer: Self-pay | Admitting: Family Medicine

## 2018-10-30 LAB — GLUCOSE, CAPILLARY
GLUCOSE-CAPILLARY: 230 mg/dL — AB (ref 70–99)
GLUCOSE-CAPILLARY: 163 mg/dL — AB (ref 70–99)
GLUCOSE-CAPILLARY: 145 mg/dL — AB (ref 70–99)
Glucose-Capillary: 216 mg/dL — ABNORMAL HIGH (ref 70–99)

## 2018-10-30 LAB — CULTURE, BLOOD (ROUTINE X 2)
Culture: NO GROWTH
Culture: NO GROWTH
Special Requests: ADEQUATE

## 2018-10-30 LAB — CBC
HCT: 33.8 % — ABNORMAL LOW (ref 39.0–52.0)
Hemoglobin: 10.3 g/dL — ABNORMAL LOW (ref 13.0–17.0)
MCH: 25.2 pg — ABNORMAL LOW (ref 26.0–34.0)
MCHC: 30.5 g/dL (ref 30.0–36.0)
MCV: 82.8 fL (ref 80.0–100.0)
Platelets: 158 10*3/uL (ref 150–400)
RBC: 4.08 MIL/uL — ABNORMAL LOW (ref 4.22–5.81)
RDW: 16.3 % — ABNORMAL HIGH (ref 11.5–15.5)
WBC: 10.3 10*3/uL (ref 4.0–10.5)
nRBC: 0.2 % (ref 0.0–0.2)

## 2018-10-30 LAB — PROTIME-INR
INR: 1.88
Prothrombin Time: 21.4 seconds — ABNORMAL HIGH (ref 11.4–15.2)

## 2018-10-30 MED ORDER — DIPHENHYDRAMINE HCL 50 MG/ML IJ SOLN: 12.5000 mg | INTRAMUSCULAR | Status: DC | PRN

## 2018-10-30 MED ORDER — LORAZEPAM 2 MG/ML PO CONC: 1.0000 mg | ORAL | Status: DC | PRN

## 2018-10-30 MED ORDER — HALOPERIDOL LACTATE 5 MG/ML IJ SOLN: 0.5000 mg | INTRAMUSCULAR | Status: DC | PRN

## 2018-10-30 MED ORDER — MAGIC MOUTHWASH
15.0000 mL | Freq: Four times a day (QID) | ORAL | Status: DC | PRN
  Filled 2018-10-30: qty 15

## 2018-10-30 MED ORDER — MORPHINE SULFATE (CONCENTRATE) 10 MG/0.5ML PO SOLN: 5.0000 mg | ORAL | Status: DC | PRN

## 2018-10-30 MED ORDER — ALBUTEROL SULFATE (2.5 MG/3ML) 0.083% IN NEBU: 2.5000 mg | INHALATION_SOLUTION | RESPIRATORY_TRACT | Status: DC | PRN

## 2018-10-30 MED ORDER — GLYCOPYRROLATE 1 MG PO TABS: 1.0000 mg | ORAL_TABLET | ORAL | Status: DC | PRN

## 2018-10-30 MED ORDER — HALOPERIDOL LACTATE 2 MG/ML PO CONC
0.5000 mg | ORAL | Status: DC | PRN
  Filled 2018-10-30: qty 0.3

## 2018-10-30 MED ORDER — ALBUTEROL SULFATE (2.5 MG/3ML) 0.083% IN NEBU
2.5000 mg | INHALATION_SOLUTION | Freq: Three times a day (TID) | RESPIRATORY_TRACT | Status: DC
  Administered 2018-10-30 – 2018-10-31 (×2): 2.5 mg via RESPIRATORY_TRACT
  Filled 2018-10-30 (×2): qty 3

## 2018-10-30 MED ORDER — HALOPERIDOL 0.5 MG PO TABS: 0.5000 mg | ORAL_TABLET | ORAL | Status: DC | PRN

## 2018-10-30 MED ORDER — ONDANSETRON 4 MG PO TBDP: 4.0000 mg | ORAL_TABLET | Freq: Four times a day (QID) | ORAL | Status: DC | PRN

## 2018-10-30 MED ORDER — ONDANSETRON HCL 4 MG/2ML IJ SOLN
4.0000 mg | Freq: Four times a day (QID) | INTRAMUSCULAR | Status: DC | PRN
  Administered 2018-10-30: 4 mg via INTRAVENOUS
  Filled 2018-10-30: qty 2

## 2018-10-30 MED ORDER — BIOTENE DRY MOUTH MT LIQD: 15.0000 mL | OROMUCOSAL | Status: DC | PRN

## 2018-10-30 MED ORDER — POLYVINYL ALCOHOL 1.4 % OP SOLN
1.0000 [drp] | Freq: Four times a day (QID) | OPHTHALMIC | Status: DC | PRN
  Filled 2018-10-30: qty 15

## 2018-10-30 MED ORDER — LORAZEPAM 2 MG/ML IJ SOLN
1.0000 mg | INTRAMUSCULAR | Status: DC | PRN
  Administered 2018-10-30 – 2018-10-31 (×2): 1 mg via INTRAVENOUS
  Filled 2018-10-30 (×2): qty 1

## 2018-10-30 MED ORDER — HYDROMORPHONE HCL 1 MG/ML IJ SOLN
0.5000 mg | INTRAMUSCULAR | Status: DC | PRN
  Administered 2018-10-30 – 2018-10-31 (×6): 0.5 mg via INTRAVENOUS
  Filled 2018-10-30 (×6): qty 1

## 2018-10-30 MED ORDER — GLYCOPYRROLATE 0.2 MG/ML IJ SOLN
0.2000 mg | INTRAMUSCULAR | Status: DC | PRN
  Administered 2018-10-30 – 2018-10-31 (×2): 0.2 mg via INTRAVENOUS
  Filled 2018-10-30 (×2): qty 1

## 2018-10-30 MED ORDER — ACETAMINOPHEN 650 MG RE SUPP: 650.0000 mg | Freq: Four times a day (QID) | RECTAL | Status: DC | PRN

## 2018-10-30 MED ORDER — MORPHINE SULFATE (CONCENTRATE) 10 MG/0.5ML PO SOLN
5.0000 mg | ORAL | Status: DC | PRN
  Administered 2018-10-31: 5 mg via SUBLINGUAL
  Filled 2018-10-30: qty 0.5

## 2018-10-30 MED ORDER — ACETAMINOPHEN 325 MG PO TABS: 650.0000 mg | ORAL_TABLET | Freq: Four times a day (QID) | ORAL | Status: DC | PRN

## 2018-10-30 MED ORDER — LORAZEPAM 1 MG PO TABS: 1.0000 mg | ORAL_TABLET | ORAL | Status: DC | PRN

## 2018-10-30 MED ORDER — TEMAZEPAM 7.5 MG PO CAPS: 15.0000 mg | ORAL_CAPSULE | Freq: Every evening | ORAL | Status: DC | PRN

## 2018-10-30 MED ORDER — GLYCOPYRROLATE 0.2 MG/ML IJ SOLN: 0.2000 mg | INTRAMUSCULAR | Status: DC | PRN

## 2018-10-30 NOTE — Telephone Encounter (Signed)
Left message for [name] to call back.  I could not find a fax in pts chart dated for 09/20/18.

## 2018-10-30 NOTE — Care Management Important Message (Signed)
Important Message  Patient Details  Name: [name] MRN: [id number] Date of Birth: Jan 28, 1936   Medicare Important Message Given:  Yes    Orbie Pyo 10/30/2018, 2:44 PM

## 2018-10-30 NOTE — Telephone Encounter (Signed)
Copied from [place] [id number]. Topic: Quick Communication - See Telephone Encounter >> Oct 30, 2018 11:51 AM Bea Graff, NT wrote: CRM for notification. See Telephone encounter for: 10/30/18. Rip Harbour with [facility] calling to request the order form tey faxed over on 09/20/2018 to be signed and sent back if possible. Fax#: [phone] CB#: [phone] may leave voicemail.

## 2018-10-30 NOTE — Progress Notes (Signed)
PT Cancellation Note  Patient Details Name: [name] MRN: [id number] DOB: 04-03-1936   Cancelled Treatment:    Reason Eval/Treat Not Completed: Medical issues which prohibited therapy per chart review, noted rapid response event last night- spoke to RN who requests that PT hold today due to medical status. Plan to hold PT for today. Will attempt to try back on next day of service if patient is medically appropriate to participate.    Deniece Ree PT, DPT, CBIS  Supplemental Physical Therapist [facility]    Pager [phone] Acute Rehab Office [phone]

## 2018-10-30 NOTE — Progress Notes (Signed)
PROGRESS NOTE    [name]  IRS:[id number] DOB: 21-Jun-1936 DOA: 11/13/2018 PCP: Tammi Sou, MD   Brief Narrative:  83 year old with past medical history relevant for severe COPD on 3 L nasal cannula, endocarditis status post mitral valve repair, paroxysmal atrial fibrillation on warfarin, stage III CKD, type 2 diabetes, coronary artery disease with no intervention, history of ulcerative colitis, history of CVA that was felt to be cardioembolic, chronic diastolic heart failure admitted with acute hypoxic respiratory failure due to community-acquired pneumonia.  Patient was initially planning discharge to home with hospice versus skilled nursing facility but then acutely worsened overnight and is currently on comfort care at the request of the family.   Assessment & Plan:   Principal Problem:   Community acquired pneumonia of left lower lobe of lung ([place]) Active Problems:   CAD (coronary artery disease)   COPD (chronic obstructive pulmonary disease) with emphysema (HCC)   Atrial fibrillation (HCC)   Pulmonary hypertension (HCC)   Chronic respiratory failure with hypoxia (HCC)   DM2 (diabetes mellitus, type 2) (HCC)   Mass of lower lobe of left lung   CKD (chronic kidney disease) stage 3, GFR 30-59 ml/min (HCC)   LLL pneumonia (HCC)   (HFpEF) heart failure with preserved ejection fraction ([place])   #) Comfort care: Per above patient acutely worsened last night and became progressively more hypoxic and altered.  Family is opted to transition the patient to comfort care as they were initially planning on taking him home with hospice. - Opiates for dyspnea and shortness of breath - Benzodiazepines for anxiety -Antipsychotics for agitation -Anticholinergics for secretions -Palliative care consult  #) COPD on 3 to 4 L nasal cannula/community-acquired pneumonia: Patient completed a course of antibiotics and was pending discharge prior to acute decompensation. -Discontinue  long-acting medications -Continue PRN short-acting bronchodilators for shortness of breath  #) Stage III CKD/type diabetes: Stop sliding scale insulin checks  #) History of CVA/status post mitral valve replacement/paroxysmal atrial fibrillation: Discontinue warfarin  #) Chronic diastolic heart failure: Discontinue oral diuretics  #) Hypertension/hyperlipidemia: Discontinue home medications  Fluids: P.o. ad lib. Electrolytes: Discontinue lab checks Nutrition: P.o. ad lib.  Prophylaxis: None  Disposition: Anticipate likely death in the hospital  DO NOT RESUSCITATE     Consultants:   Palliative care  Procedures:  None  Antimicrobials:   None   Subjective: Overnight patient took a turn for the worse and became progressively more hypoxic and altered.  This morning after extensive discussion with the patient's wife and son they have opted to transition the patient to comfort care.  He currently does not appear to be uncomfortable.  Objective: Vitals:   10/30/18 0759 10/30/18 0800 10/30/18 0806 10/30/18 0812  BP:   138/67   Pulse:   (!) 106 (!) 106  Resp:    (!) 30  Temp:   99.6 F (37.6 C)   TempSrc:   Oral   SpO2: 99% 98% 99% 95%  Weight:      Height:        Intake/Output Summary (Last 24 hours) at 10/30/2018 0093 Last data filed at 10/30/2018 8182 Gross per 24 hour  Intake -  Output 1650 ml  Net -1650 ml   Filed Weights   10/30/2018 1600  Weight: 80.1 kg    Examination:  General exam: Mildly uncomfortable appearing Respiratory system: Mildly increased work of breathing, diffusely rhonchorous, no wheezes, scattered crackles Cardiovascular system: Regular rate and rhythm, no murmurs Gastrointestinal system: Soft, nondistended, no  rebound or guarding, plus bowel sounds Central nervous system: Not alert or oriented, grossly moving all extremities Extremities: No lower extremity edema Skin: No rashes over visible skin Psychiatry: Unable to assess due to  medical condition    Data Reviewed: I have personally reviewed following labs and imaging studies  CBC: Recent Labs  Lab 10/24/2018 1208 10/27/18 0315 10/30/18 0716  WBC 9.7 9.1 10.3  NEUTROABS 7.7 7.1  --   HGB 10.8* 9.5* 10.3*  HCT 34.9* 30.7* 33.8*  MCV 84.9 84.6 82.8  PLT 160 134* 161   Basic Metabolic Panel: Recent Labs  Lab 11/03/2018 1208 10/26/18 0013 10/27/18 0315  NA 138 138 139  K 3.8 4.4 4.1  CL 98 102 100  CO2 24 28 26   GLUCOSE 279* 154* 145*  BUN 16 18 22   CREATININE 1.38* 1.17 1.09  CALCIUM 9.1 8.5* 8.5*  MG  --  1.9  --    GFR: Estimated Creatinine Clearance: 52.3 mL/min (by C-G formula based on SCr of 1.09 mg/dL). Liver Function Tests: No results for input(s): AST, ALT, ALKPHOS, BILITOT, PROT, ALBUMIN in the last 168 hours. No results for input(s): LIPASE, AMYLASE in the last 168 hours. No results for input(s): AMMONIA in the last 168 hours. Coagulation Profile: Recent Labs  Lab 10/30/2018 1208 10/27/18 0315 10/28/18 0736 10/29/18 0239 10/30/18 0716  INR 2.29 2.25 1.87 1.81 1.88   Cardiac Enzymes: No results for input(s): CKTOTAL, CKMB, CKMBINDEX, TROPONINI in the last 168 hours. BNP (last 3 results) Recent Labs    09/18/18 0952  PROBNP 78.0   HbA1C: No results for input(s): HGBA1C in the last 72 hours. CBG: Recent Labs  Lab 10/29/18 0756 10/29/18 1138 10/29/18 1722 10/29/18 2044 10/30/18 0807  GLUCAP 138* 172* 187* 127* 216*   Lipid Profile: No results for input(s): CHOL, HDL, LDLCALC, TRIG, CHOLHDL, LDLDIRECT in the last 72 hours. Thyroid Function Tests: No results for input(s): TSH, T4TOTAL, FREET4, T3FREE, THYROIDAB in the last 72 hours. Anemia Panel: No results for input(s): VITAMINB12, FOLATE, FERRITIN, TIBC, IRON, RETICCTPCT in the last 72 hours. Sepsis Labs: Recent Labs  Lab 10/20/2018 1320  LATICACIDVEN 1.8    Recent Results (from the past 240 hour(s))  Culture, blood (Routine X 2) w Reflex to ID Panel     Status:  None   Collection Time: 10/24/2018 12:44 PM  Result Value Ref Range Status   Specimen Description BLOOD RIGHT ANTECUBITAL  Final   Special Requests   Final    BOTTLES DRAWN AEROBIC AND ANAEROBIC Blood Culture adequate volume   Culture   Final    NO GROWTH 5 DAYS Performed at [facility], [place] [address], [place], [place] [address]    Report Status 10/30/2018 FINAL  Final  Culture, blood (Routine X 2) w Reflex to ID Panel     Status: None   Collection Time: 11/03/2018 12:49 PM  Result Value Ref Range Status   Specimen Description BLOOD RIGHT HAND  Final   Special Requests   Final    AEROBIC BOTTLE ONLY Blood Culture results may not be optimal due to an inadequate volume of blood received in culture bottles   Culture   Final    NO GROWTH 5 DAYS Performed at [facility], [place] [address], [place], [place] [address]    Report Status 10/30/2018 FINAL  Final  Expectorated sputum assessment w rflx to resp cult     Status: None   Collection Time: 11/13/2018  3:18 PM  Result Value Ref Range Status   Specimen Description EXPECTORATED SPUTUM  Final   Special Requests NONE  Final   Sputum evaluation   Final    THIS SPECIMEN IS ACCEPTABLE FOR SPUTUM CULTURE Performed at [facility], [address], [place], [place] [address]    Report Status 11/10/2018 FINAL  Final  Culture, respiratory     Status: None   Collection Time: 11/01/2018  3:18 PM  Result Value Ref Range Status   Specimen Description EXPECTORATED SPUTUM  Final   Special Requests NONE Reflexed from [id number]  Final   Gram Stain   Final    ABUNDANT WBC PRESENT,BOTH PMN AND MONONUCLEAR MODERATE GRAM POSITIVE COCCI FEW GRAM VARIABLE ROD Performed at [facility], [place] [address] [place], [place] [address]    Culture MODERATE Consistent with normal respiratory flora.  Final   Report Status 10/28/2018 FINAL  Final  Respiratory Panel by PCR     Status: None   Collection Time: 11/09/2018  3:19 PM  Result Value Ref  Range Status   Adenovirus NOT DETECTED NOT DETECTED Final   Coronavirus 229E NOT DETECTED NOT DETECTED Final    Comment: (NOTE) The Coronavirus on the Respiratory Panel, DOES NOT test for the novel  Coronavirus (2019 nCoV)    Coronavirus HKU1 NOT DETECTED NOT DETECTED Final   Coronavirus NL63 NOT DETECTED NOT DETECTED Final   Coronavirus OC43 NOT DETECTED NOT DETECTED Final   Metapneumovirus NOT DETECTED NOT DETECTED Final   Rhinovirus / Enterovirus NOT DETECTED NOT DETECTED Final   Influenza A NOT DETECTED NOT DETECTED Final   Influenza B NOT DETECTED NOT DETECTED Final   Parainfluenza Virus 1 NOT DETECTED NOT DETECTED Final   Parainfluenza Virus 2 NOT DETECTED NOT DETECTED Final   Parainfluenza Virus 3 NOT DETECTED NOT DETECTED Final   Parainfluenza Virus 4 NOT DETECTED NOT DETECTED Final   Respiratory Syncytial Virus NOT DETECTED NOT DETECTED Final   Bordetella pertussis NOT DETECTED NOT DETECTED Final   Chlamydophila pneumoniae NOT DETECTED NOT DETECTED Final   Mycoplasma pneumoniae NOT DETECTED NOT DETECTED Final    Comment: Performed at [facility], [address], [place], [place] [address]         Radiology Studies: Dg Chest Port 1 View  Result Date: 10/28/2018 CLINICAL DATA:  Patient with productive cough and shortness of breath EXAM: PORTABLE CHEST 1 VIEW COMPARISON:  Chest radiograph 10/26/2018 FINDINGS: Monitoring leads overlie the patient. Stable enlarged cardiac and mediastinal contours status post median sternotomy. Aortic atherosclerosis. Similar-appearing bilateral interstitial pulmonary opacities. Trace effusions. No pneumothorax. IMPRESSION: Cardiomegaly. Similar bilateral interstitial opacities favored to represent mild edema. Electronically Signed   By: Lovey Newcomer M.D.   On: 10/28/2018 14:48        Scheduled Meds: . albuterol  2.5 mg Nebulization QID  . pantoprazole  40 mg Oral Daily  . sodium chloride flush  3 mL Intravenous Q12H   Continuous  Infusions: . sodium chloride 10 mL/hr (11/01/2018 1601)     LOS: 5 days    Time spent: [place], MD [facility]  If 7PM-7AM, please contact night-coverage [email or web address] Password [facility] 10/30/2018, 9:43 AM

## 2018-10-30 NOTE — Progress Notes (Signed)
Pt displayed increased work of breathing. He was restless and saturations in the high 80s. Respiratory then alerted to evaluate pt. Pt then placed on NRB and sats came up to 100s. Rapid response was then notified of patient's change in respiratory status.   Night provider made aware and ordered IV lasix. Medication administered and will continue to monitor respiratory status and urine output.

## 2018-10-31 DIAGNOSIS — Z515 Encounter for palliative care: Secondary | ICD-10-CM

## 2018-10-31 DIAGNOSIS — J189 Pneumonia, unspecified organism: Secondary | ICD-10-CM

## 2018-10-31 DIAGNOSIS — J9611 Chronic respiratory failure with hypoxia: Secondary | ICD-10-CM

## 2018-10-31 MED ORDER — GLYCOPYRROLATE 0.2 MG/ML IJ SOLN
0.4000 mg | Freq: Three times a day (TID) | INTRAMUSCULAR | Status: DC
  Administered 2018-10-31: 0.4 mg via INTRAVENOUS
  Filled 2018-10-31: qty 2

## 2018-10-31 MED ORDER — HYDROMORPHONE BOLUS VIA INFUSION
0.5000 mg | INTRAVENOUS | Status: DC | PRN
  Filled 2018-10-31: qty 1

## 2018-10-31 MED ORDER — SODIUM CHLORIDE 0.9 % IV SOLN
0.5000 mg/h | INTRAVENOUS | Status: DC
  Administered 2018-10-31: 1 mg/h via INTRAVENOUS
  Filled 2018-10-31: qty 2.5

## 2018-10-31 MED ORDER — HYDROMORPHONE HCL 1 MG/ML IJ SOLN
0.5000 mg | INTRAMUSCULAR | Status: DC | PRN
  Administered 2018-10-31: 1 mg via INTRAVENOUS
  Filled 2018-10-31: qty 1

## 2018-11-02 NOTE — Telephone Encounter (Signed)
Left message for Rip Harbour to call back.

## 2018-11-05 NOTE — Telephone Encounter (Signed)
Left message for Rip Harbour to call back.

## 2018-11-08 NOTE — Telephone Encounter (Signed)
[name] has not returned my call. Will save message to chart.

## 2018-11-18 NOTE — Progress Notes (Signed)
Patient has expired. Family at bedside. Verified death with Esperanza Heir RN. Dr. Maylene Roes informed. Wasted 50 ml of Dilaudid with Esperanza Heir RN. [facility] called.

## 2018-11-18 NOTE — Progress Notes (Signed)
Full note to follow.  Patient very near EOL.  Discussed with RN and family initiating opioid infusion and removing the venti mask when he is comfortable.  Family understands he is dying and wants him comfortable.  Orders placed.   Will round again later today.  Florentina Jenny, PA-C Palliative Medicine Pager: [phone]

## 2018-11-18 NOTE — Consult Note (Signed)
Consultation Note Date: Nov 13, 2018   Patient Name: [name]  DOB: November 01, 1935  MRN: [id number]  Age / Sex: 83 y.o., male  PCP: Tammi Sou, MD Referring Physician: Dessa Phi, DO  Reason for Consultation: Non pain symptom management, Pain control, Psychosocial/spiritual support and Terminal Care  HPI/Patient Profile: 83 y.o. male  with past medical history of COPD (on 3L at home), endocarditis s/p MVR, afib on coumadin, D-HF, CKD3, DM, ulcerative colitis, CVA who was admitted on 10/29/2018 with left lower lobe pneumonia.  Respiratory viral panel was positive for Rhinovirus/Enterovirus.  [name] did not improve significantly with treatment.  After multiple conversations with family the patient's wife opted for comfort care.  Palliative Medicine was consulted for symptom management.  Clinical Assessment and Goals of Care: Discussed patient care with RN who expressed concerned about dyspnea, discomfort and agitation.   Met family (Wife, 54 of 2 sons and close friend are at bedside).  Patient appears close to end of life despite being on venti mask.  Discussed prognosis with family.  Their wish for him at this point is to be comfortable.  We explained the need to utilize medications to stop his suffering and to remove the venti mask once he is comfortable.  Family is in agreement with this plan.  Primary Decision Maker:  HCPOA - family at bedside.    SUMMARY OF RECOMMENDATIONS    Will schedule robinul for excess secretions. Initiate continuous dilaudid infusion with boluses for dyspnea, agitation and for comfort at end of life. These measures are in addition to current orders.  Will remove venti mask once patient is no longer SOB. PMT will round multiple times per day to attempt to ensure his comfort.   Code Status/Advance Care Planning:  DNR   Psycho-social/Spiritual:   Desire for further  Chaplaincy support: yes.  Chaplain has been present.  Prognosis:   Hours.  Will most likely pass within the next 24 hours  Discharge Planning: Anticipated Hospital Death      Primary Diagnoses: Present on Admission: . Atrial fibrillation ([place]) . CAD (coronary artery disease) . CKD (chronic kidney disease) stage 3, GFR 30-59 ml/min (HCC) . Community acquired pneumonia of left lower lobe of lung ([place]) . COPD (chronic obstructive pulmonary disease) with emphysema ([place]) . Mass of lower lobe of left lung . Pulmonary hypertension ([place]) . LLL pneumonia ([place]) . (HFpEF) heart failure with preserved ejection fraction ([place]) . Chronic respiratory failure with hypoxia ([place])   I have reviewed the medical record, interviewed the patient and family, and examined the patient. The following aspects are pertinent.  Past Medical History:  Diagnosis Date  . Adrenal adenoma   . Atrial fibrillation (HCC)    Rate control + Coumadin  . CAD (coronary artery disease)    Lexiscan Myoview (01/2014): Lateral soft tissue attenuation, no ischemia, not gated, low risk  . Cancer ([place])   . CAP (community acquired pneumonia)    Hospitalized 10/2014  . Chronic diastolic heart failure ([place])   . Chronic hypoxemic  respiratory failure (HCC)    from COPD  . Chronic renal insufficiency, stage III (moderate) (HCC)    CrCl 50s  . COPD (chronic obstructive pulmonary disease) (HCC)    oxygen-dependent (2.5 L 24/7).  Improved on starting chronic prednisone 06/2017 (increased to 20 mg qd 08/2017)  Progressive worsening dyspnea through 2019: 3 L oxygen, 59m prednisone qd chronically as of 07/2018.  . Diabetes mellitus without complication ([facility] 081/19/1478  A1c 6.5 % 05/2016  . Diverticulosis   . Dyslipidemia   . Embolic stroke ([facility]    d/t endocarditis 2008  . Hemorrhoids   . History of mitral valve replacement    needs SBE prophylaxis  . History of prostate cancer remote past   f/u by Dr. DClaris Che no recurrence  as of f/u 10/2015.  PSA rising from 2013 to 04/2018 (PSA 04/25/18=18.49): urol to rpt PSA and check CT and bone scan  approx 10/2018.  .Marland KitchenHTN (hypertension)   . Left lower lobe pulmonary nodule July/Aug 2019.   04/2018: enlarging irregular nodular opacity.  CT surgery-->PET scan: inflamm/post-infect vs malignant. Orig plan was to f/u CT chest 3 mo per CT surg, then pt was admitted again for pneumonia and rpt CT chest showed equivocal change in the region of question, pulm MD plan was to repeat CT chest again 02/2019.  . Mediastinal lymphadenopathy   . Pulmonary hypertension ([facility] 01/09/2015   secondary  . Right thyroid nodule   . Thrombocytopenia ([facility]   . TIA (transient ischemic attack) 03/25/2011   ? of  (transient diplopia)  . Ulcerative colitis    left-sided/segmental, associated with diverticulosis.  Sulfasalazine per GI (Dr. GCarlean Purl.  . Warthin's tumor    Social History   Socioeconomic History  . Marital status: Married    Spouse name: JJeanett Schlein . Number of children: 2  . Years of education: Not on file  . Highest education level: Not on file  Occupational History  . 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 Financial resource strain: Not on file  . Food insecurity:    Worry: Not on file    Inability: Not on file  . Transportation needs:    Medical: Not on file    Non-medical: Not on file  Tobacco Use  . Smoking status: Former Smoker    Packs/day: 3.00    Years: 60.00    Pack years: 180.00    Types: Cigarettes    Last attempt to quit: 09/19/2002    Years since quitting: 16.1  . Smokeless tobacco: Never Used  Substance and Sexual Activity  . Alcohol use: No    Alcohol/week: 0.0 standard drinks  . Drug use: No  . Sexual activity: Not on file  Lifestyle  . Physical activity:    Days per week: Not on file    Minutes per session: Not on file  . Stress: Not on file  Relationships  . Social connections:    Talks on phone: Not on file     Gets together: Not on file    Attends religious service: Not on file    Active member of club or organization: Not on file    Attends meetings of clubs or organizations: Not on file    Relationship status: Not on file  Other Topics Concern  . Not on file  Social History Narrative   Married, 2 children.   Retired tSystems developerfrom [employer]   180 pack-year tob  hx.  No alcohol.   Family History  Problem Relation Age of Onset  . Cancer Mother        spinal  . Stroke Father   . Heart disease Father   . Colon cancer Neg Hx   . Heart attack Neg Hx    Scheduled Meds: . albuterol  2.5 mg Nebulization TID  . glycopyrrolate  0.4 mg Intravenous TID  . sodium chloride flush  3 mL Intravenous Q12H   Continuous Infusions: . HYDROmorphone 1 mg/hr (2018-11-15 1007)   PRN Meds:.acetaminophen **OR** acetaminophen, albuterol, antiseptic oral rinse, diphenhydrAMINE, glycopyrrolate **OR** glycopyrrolate **OR** glycopyrrolate, haloperidol **OR** haloperidol **OR** haloperidol lactate, HYDROmorphone, HYDROmorphone (DILAUDID) injection, LORazepam **OR** LORazepam **OR** LORazepam, magic mouthwash, ondansetron **OR** ondansetron (ZOFRAN) IV, polyvinyl alcohol, sodium chloride flush, temazepam No Known Allergies Review of Systems Patient is non-verbal  Physical Exam  Well developed elderly man actively dying, rhodochrous breath sounds, with dyspnea.  Does not open his eyes or respond to exam CV tachycardic Abdomen soft, nt, nd   Vital Signs: BP 135/78 (BP Location: Right Arm)   Pulse (!) 116   Temp 98 F (36.7 C)   Resp (!) 21   Ht _0  (1.727 m)   Wt 80.1 kg   SpO2 96%   BMI 26.85 kg/m  Pain Scale: PAINAD POSS *See Group Information*: 1-Acceptable,Awake and alert Pain Score: 0-No pain   SpO2: SpO2: 96 % O2 Device:SpO2: 96 % O2 Flow Rate: .O2 Flow Rate (L/min): 14 L/min  IO: Intake/output summary:   Intake/Output Summary (Last 24 hours) at 2018-11-15 1014 Last data filed at  2018/11/15 0602 Gross per 24 hour  Intake -  Output 250 ml  Net -250 ml    LBM: Last BM Date: 10/28/18 Baseline Weight: Weight: 80.1 kg Most recent weight: Weight: 80.1 kg     Palliative Assessment/Data: 10%   Flowsheet Rows     Most Recent Value  Intake Tab  Referral Department  Hospitalist  Unit at Time of Referral  Intermediate Care Unit  Palliative Care Primary Diagnosis  Sepsis/Infectious Disease  Date Notified  10/30/18  Palliative Care Type  New Palliative care  Reason for referral  End of Life Care Assistance  Date of Admission  11/15/2018  # of days IP prior to Palliative referral  5  Clinical Assessment  Psychosocial & Spiritual Assessment  Palliative Care Outcomes      Time In: 9:00 Time Out: 9:50 Time Total: 50 min. Greater than 50%  of this time was spent counseling and coordinating care related to the above assessment and plan.  Signed by: Florentina Jenny, PA-C Palliative Medicine Pager: [phone]  Please contact Palliative Medicine Team phone at [phone] for questions and concerns.  For individual provider: See Shea Evans

## 2018-11-18 NOTE — Death Summary Note (Signed)
DEATH SUMMARY   Patient Details  Name: [name] MRN: [id number] DOB: [birth year]  Admission/Discharge Information   Admit Date:  11/17/2018  Date of Death: Date of Death: 23-Nov-2018  Time of Death: Time of Death: 01/04/1017  Length of Stay: 2022/12/16  Referring Physician: Tammi Sou, MD   Reason(s) for Hospitalization  Acute hypoxic respiratory failure   Diagnoses  Preliminary cause of death: Acute hypoxic respiratory failure secondary to CAP  Secondary Diagnoses (including complications and co-morbidities):  Principal Problem:   Community acquired pneumonia of left lower lobe of lung ([place]) Active Problems:   CAD (coronary artery disease)   COPD (chronic obstructive pulmonary disease) with emphysema (HCC)   Atrial fibrillation ([place])   Pulmonary hypertension ([place])   Chronic respiratory failure with hypoxia (HCC)   DM2 (diabetes mellitus, type 2) ([place])   Mass of lower lobe of left lung   CKD (chronic kidney disease) stage 3, GFR 30-59 ml/min (HCC)   LLL pneumonia (HCC)   (HFpEF) heart failure with preserved ejection fraction [facility])   Palliative care encounter   Terminal care   Brief Hospital Course (including significant findings, care, treatment, and services provided and events leading to death)  [name] is a 83 y.o. year old male with past medical history relevant for severe COPD on 3 L nasal cannula, endocarditis status post mitral valve repair, paroxysmal atrial fibrillation on warfarin, stage III CKD, type 2 diabetes, coronary artery disease with no intervention, history of ulcerative colitis, history of CVA that was felt to be cardioembolic, chronic diastolic heart failure admitted with acute hypoxic respiratory failure due to community-acquired pneumonia. He was initially treated with IV antibiotics, neb treatments. Patient was initially planning discharge to home with hospice versus skilled nursing facility but then acutely worsened overnight 2/10-2/11 with worsening  respiratory failure, desaturation. Family wanted to transition him to full comfort care.   Pertinent Labs and Studies  Significant Diagnostic Studies Dg Chest Port 1 View  Result Date: 10/28/2018 CLINICAL DATA:  Patient with productive cough and shortness of breath EXAM: PORTABLE CHEST 1 VIEW COMPARISON:  Chest radiograph 10/26/2018 FINDINGS: Monitoring leads overlie the patient. Stable enlarged cardiac and mediastinal contours status post median sternotomy. Aortic atherosclerosis. Similar-appearing bilateral interstitial pulmonary opacities. Trace effusions. No pneumothorax. IMPRESSION: Cardiomegaly. Similar bilateral interstitial opacities favored to represent mild edema. Electronically Signed   By: Lovey Newcomer M.D.   On: 10/28/2018 14:48   Dg Chest Port 1 View  Result Date: 10/26/2018 CLINICAL DATA:  Follow-up left lower lobe infiltrate EXAM: PORTABLE CHEST 1 VIEW COMPARISON:  2018/11/17 FINDINGS: Cardiac shadow is stable. Postsurgical changes are again seen. Vascular congestion with mild interstitial edema is noted. Patchy infiltrate in the left base is again noted and stable. Diffuse aortic calcifications are noted. IMPRESSION: Mild vascular congestion and interstitial edema slightly increased from the prior exam. Persistent left basal infiltrate. Electronically Signed   By: Inez Catalina M.D.   On: 10/26/2018 08:18   Dg Chest Port 1 View  Result Date: 11-17-18 CLINICAL DATA:  Shortness of Breath EXAM: PORTABLE CHEST 1 VIEW COMPARISON:  Chest CT August 17, 2018 and chest radiograph August 14, 2018 FINDINGS: There is patchy airspace consolidation in the left base. There is mild right base atelectasis. Heart is mildly enlarged with pulmonary vascularity normal. The patient is status post mitral valve replacement. No adenopathy. There is aortic atherosclerosis. No bone lesions. IMPRESSION: Left lower lobe airspace consolidation consistent with pneumonia. Mild right base atelectasis. Stable cardiac  prominence.  There is aortic atherosclerosis. Patient is status post mitral valve replacement. Followup PA and lateral chest radiographs recommended in 3-4 weeks following trial of antibiotic therapy to ensure resolution and exclude underlying malignancy. Electronically Signed   By: Lowella Grip III M.D.   On: 10/26/2018 12:25    Microbiology Recent Results (from the past 240 hour(s))  Culture, blood (Routine X 2) w Reflex to ID Panel     Status: None   Collection Time: 10/24/2018 12:44 PM  Result Value Ref Range Status   Specimen Description BLOOD RIGHT ANTECUBITAL  Final   Special Requests   Final    BOTTLES DRAWN AEROBIC AND ANAEROBIC Blood Culture adequate volume   Culture   Final    NO GROWTH 5 DAYS Performed at [facility], [address], [place], [place] [address]    Report Status 10/30/2018 FINAL  Final  Culture, blood (Routine X 2) w Reflex to ID Panel     Status: None   Collection Time: 10/23/2018 12:49 PM  Result Value Ref Range Status   Specimen Description BLOOD RIGHT HAND  Final   Special Requests   Final    AEROBIC BOTTLE ONLY Blood Culture results may not be optimal due to an inadequate volume of blood received in culture bottles   Culture   Final    NO GROWTH 5 DAYS Performed at [facility], [place] [address] [place], [place] [address]    Report Status 10/30/2018 FINAL  Final  Expectorated sputum assessment w rflx to resp cult     Status: None   Collection Time: 11/03/2018  3:18 PM  Result Value Ref Range Status   Specimen Description EXPECTORATED SPUTUM  Final   Special Requests NONE  Final   Sputum evaluation   Final    THIS SPECIMEN IS ACCEPTABLE FOR SPUTUM CULTURE Performed at [facility], [place] [address] [place], [place] [address]    Report Status 11/06/2018 FINAL  Final  Culture, respiratory     Status: None   Collection Time: 10/20/2018  3:18 PM  Result Value Ref Range Status   Specimen Description EXPECTORATED SPUTUM  Final   Special  Requests NONE Reflexed from [id number]  Final   Gram Stain   Final    ABUNDANT WBC PRESENT,BOTH PMN AND MONONUCLEAR MODERATE GRAM POSITIVE COCCI FEW GRAM VARIABLE ROD Performed at [facility], [place] [address] [place], [place] [address]    Culture MODERATE Consistent with normal respiratory flora.  Final   Report Status 10/28/2018 FINAL  Final  Respiratory Panel by PCR     Status: None   Collection Time: 10/29/2018  3:19 PM  Result Value Ref Range Status   Adenovirus NOT DETECTED NOT DETECTED Final   Coronavirus 229E NOT DETECTED NOT DETECTED Final    Comment: (NOTE) The Coronavirus on the Respiratory Panel, DOES NOT test for the novel  Coronavirus (2019 nCoV)    Coronavirus HKU1 NOT DETECTED NOT DETECTED Final   Coronavirus NL63 NOT DETECTED NOT DETECTED Final   Coronavirus OC43 NOT DETECTED NOT DETECTED Final   Metapneumovirus NOT DETECTED NOT DETECTED Final   Rhinovirus / Enterovirus NOT DETECTED NOT DETECTED Final   Influenza A NOT DETECTED NOT DETECTED Final   Influenza B NOT DETECTED NOT DETECTED Final   Parainfluenza Virus 1 NOT DETECTED NOT DETECTED Final   Parainfluenza Virus 2 NOT DETECTED NOT DETECTED Final   Parainfluenza Virus 3 NOT DETECTED NOT DETECTED Final   Parainfluenza Virus  4 NOT DETECTED NOT DETECTED Final   Respiratory Syncytial Virus NOT DETECTED NOT DETECTED Final   Bordetella pertussis NOT DETECTED NOT DETECTED Final   Chlamydophila pneumoniae NOT DETECTED NOT DETECTED Final   Mycoplasma pneumoniae NOT DETECTED NOT DETECTED Final    Comment: Performed at [facility], [place] [address] [place], [place] [address]    Lab Basic Metabolic Panel: Recent Labs  Lab 10/21/2018 1208 10/26/18 0013 10/27/18 0315  NA 138 138 139  K 3.8 4.4 4.1  CL 98 102 100  CO2 24 28 26   GLUCOSE 279* 154* 145*  BUN 16 18 22   CREATININE 1.38* 1.17 1.09  CALCIUM 9.1 8.5* 8.5*  MG  --  1.9  --    Liver Function Tests: No results for input(s): AST, ALT, ALKPHOS,  BILITOT, PROT, ALBUMIN in the last 168 hours. No results for input(s): LIPASE, AMYLASE in the last 168 hours. No results for input(s): AMMONIA in the last 168 hours. CBC: Recent Labs  Lab 11/05/2018 1208 10/27/18 0315 10/30/18 0716  WBC 9.7 9.1 10.3  NEUTROABS 7.7 7.1  --   HGB 10.8* 9.5* 10.3*  HCT 34.9* 30.7* 33.8*  MCV 84.9 84.6 82.8  PLT 160 134* 158   Cardiac Enzymes: No results for input(s): CKTOTAL, CKMB, CKMBINDEX, TROPONINI in the last 168 hours. Sepsis Labs: Recent Labs  Lab 10/24/2018 1208 11/01/2018 1320 10/27/18 0315 10/30/18 0716  WBC 9.7  --  9.1 10.3  LATICACIDVEN  --  1.8  --   --       Dessa Phi 11-27-2018, 10:50 AM

## 2018-11-18 DEATH — deceased

## 2018-12-03 ENCOUNTER — Ambulatory Visit: Payer: Medicare Other | Admitting: Emergency Medicine

## 2018-12-07 ENCOUNTER — Ambulatory Visit: Payer: Medicare Other | Admitting: Emergency Medicine

## 2019-09-28 IMAGING — CT NM PET TUM IMG RESTAG (PS) SKULL BASE T - THIGH
8 series · 20 of 25 positions shown · non-contrast
Comparison: CT chest 04/24/2018.

CLINICAL DATA: Initial treatment strategy for lung nodule.

EXAM:
NUCLEAR MEDICINE PET SKULL BASE TO THIGH
TECHNIQUE: 9.28 mCi F-18 FDG was injected intravenously. Full-ring PET imaging
was performed from the skull base to thigh after the radiotracer. CT
data was obtained and used for attenuation correction and anatomic
localization.
Fasting blood glucose: 305 mg/dl

[Series 3: pet sk_thigh ac · axial · 5.0mm · 4.07mm/px · z∈[-1192,-392]mm · 3 of 201 slices shown]
[im 1/201]
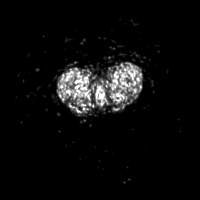
[im 67/201]
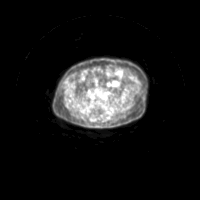
[im 201/201]
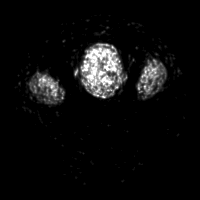

[Series 4: ct sk_thigh 5.0 b31f · axial · 5.0mm · 0.95mm/px · z∈[-1192,-392]mm · 4 of 201 slices shown]
[im 1/201]
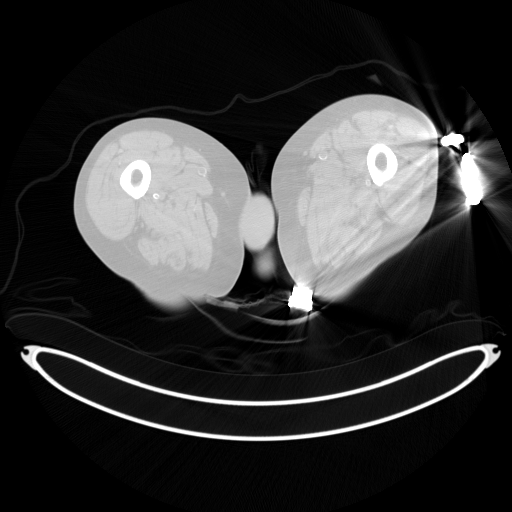
[im 51/201]
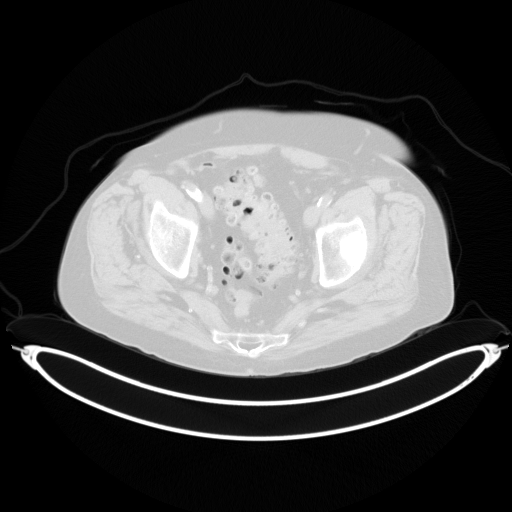
[im 101/201]
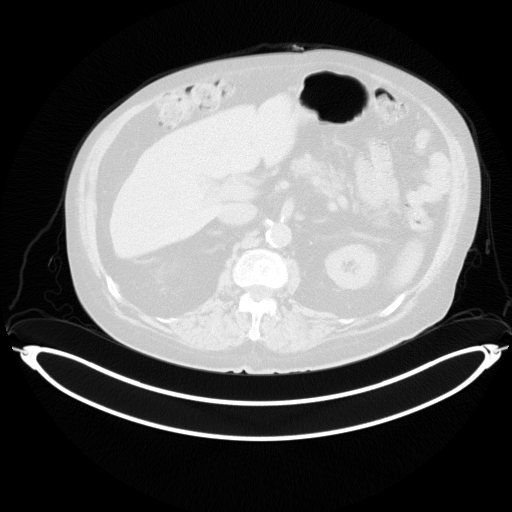
[im 201/201  brain]
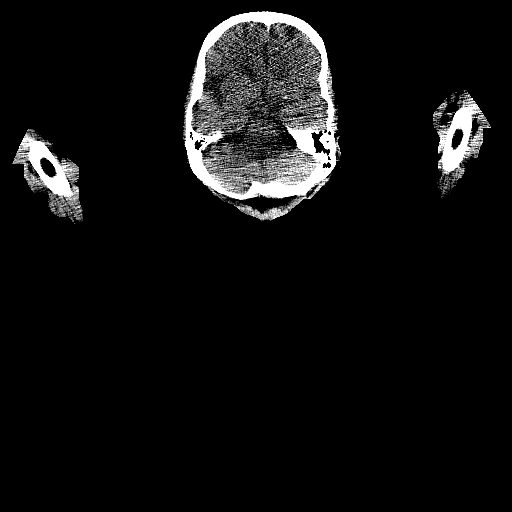

[Series 5: pet sk_thigh nac · axial · 5.0mm · 4.07mm/px · z∈[-1192,-392]mm · 4 of 201 slices shown]
[im 1/201]
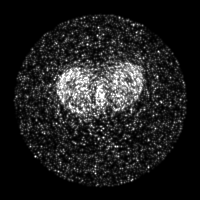
[im 51/201]
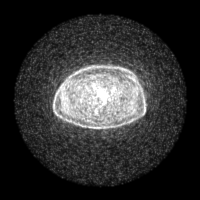
[im 101/201]
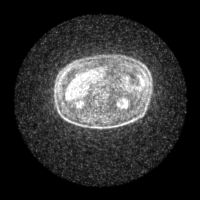
[im 201/201]
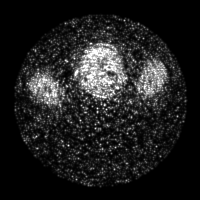

[Series 8: ct sk_thigh 5.0 b70f (id)_bone · axial · 5.0mm · 0.63mm/px · z∈[-744,-504]mm · 2 of 61 slices shown]
[im 1/61  bone]
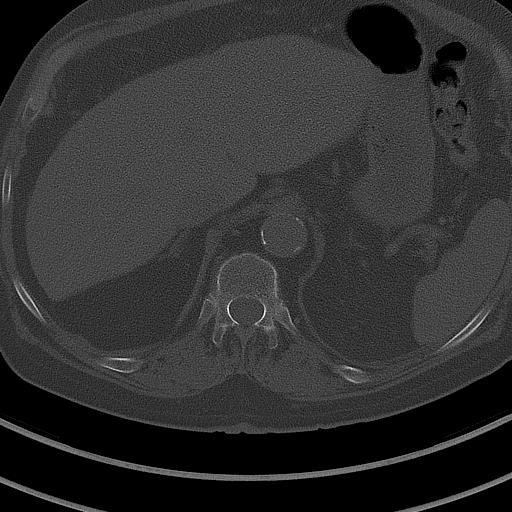
[im 61/61  bone]
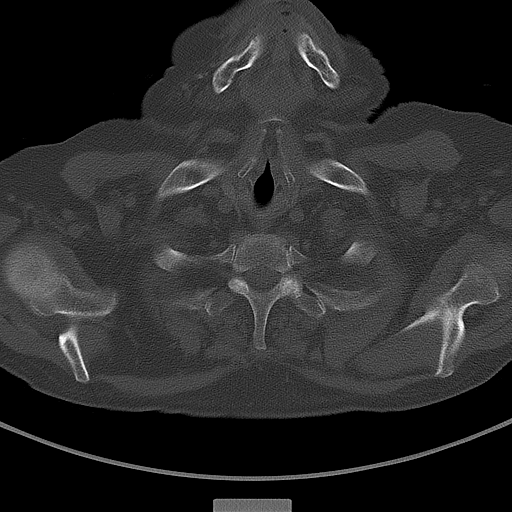

[Series 603: mip range · coronal · 1.68mm/px · 1 of 32 slices shown]
[im 1/32]
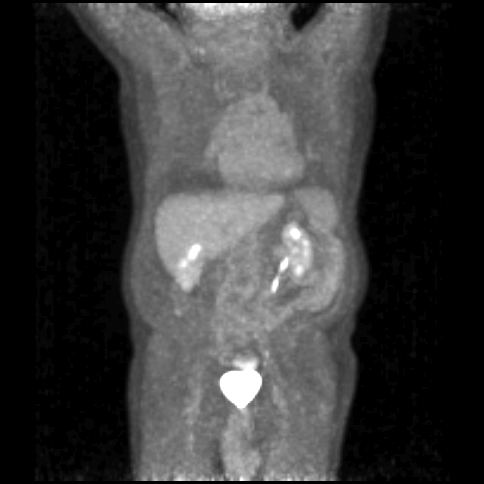

[Series 604: range-ct sk_thigh 5.0 (id)<alpha range> · 1 of 76 slices shown (1 of 2)]
[im 76/76]
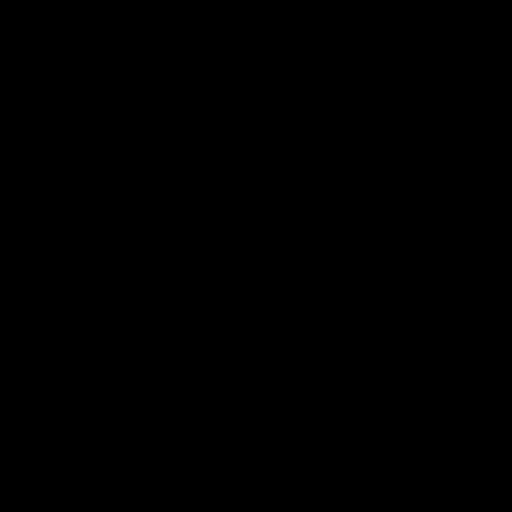

[Series 605: range-ct sk_thigh 5.0 (id)<alpha range> · 4 of 190 slices shown (2 of 2)]
[im 1/190]
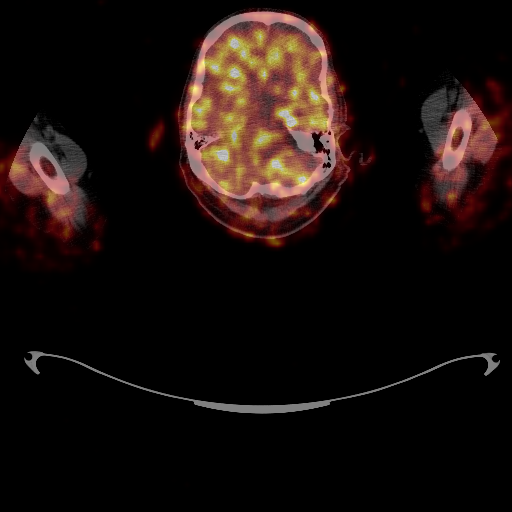
[im 48/190]
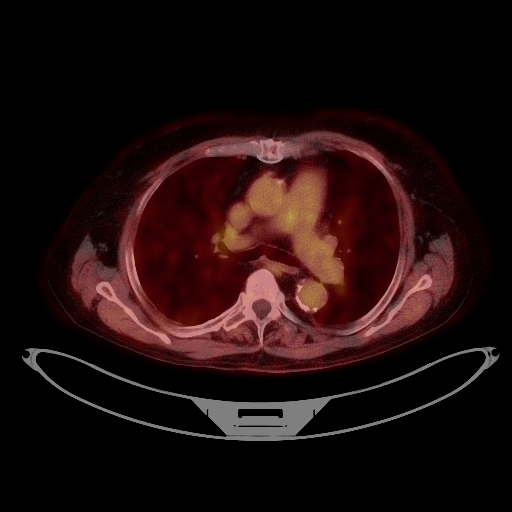
[im 95/190]
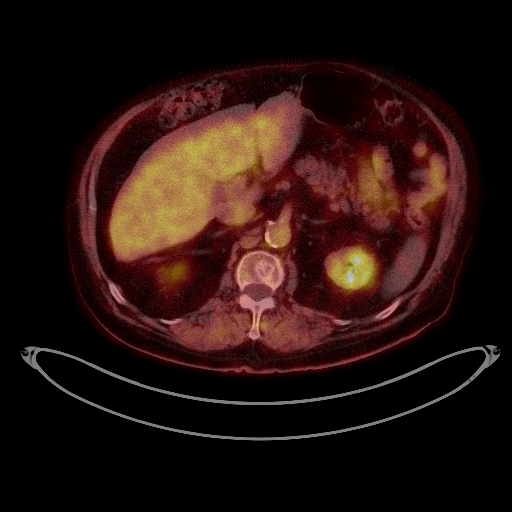
[im 190/190]
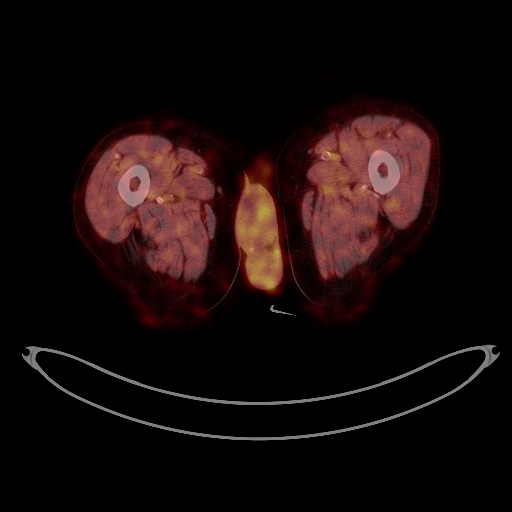

[Series 1056: results mm oncology reading · 0.9mm · 0.49mm/px · 1 of 6 slices shown]
[im 1/6]
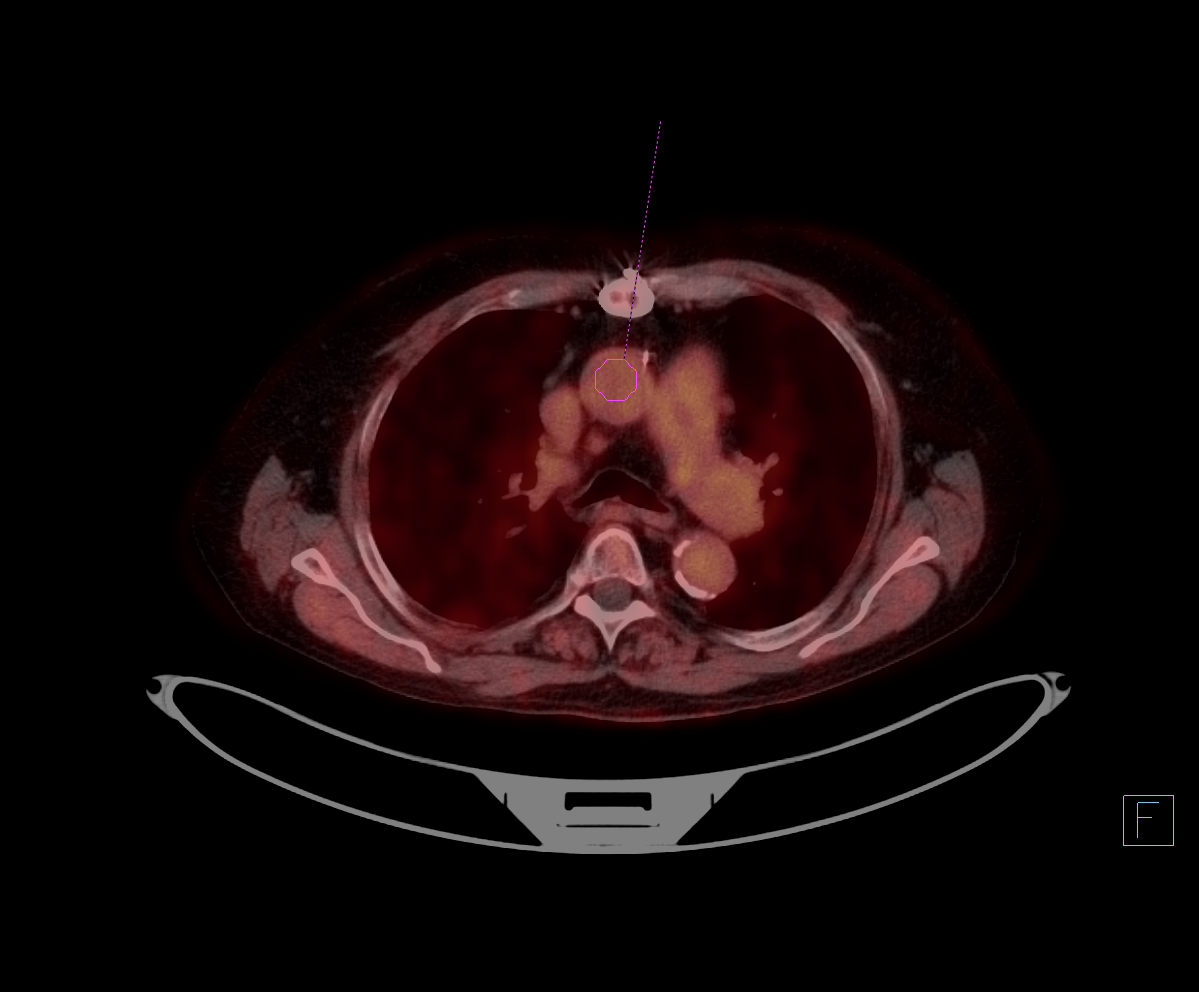

[20 of 25 positions shown; findings below may reference images not displayed]

FINDINGS: Mediastinal blood pool activity: SUV max

NECK: There is a small right level-II cervical lymph node posterior
to the right parotid gland measuring 8 mm within SUV max of 3.48.

Incidental CT findings: none

CHEST: There is no hypermetabolic mediastinal or hilar adenopathy.
The peripheral area of masslike architectural distortion/airspace
consolidation within the posterior left lower lobe is again noted.
On today's study this area measures 5.2 x 1.8 cm, image 37/8.
Previously 5.2 x 3.0 cm. Mild FDG uptake within this area has an SUV
max of 2.2. Tiny nodule within the right upper lobe measures 4 mm
and appears unchanged; Too small to characterize by PET-CT.

Incidental CT findings: Mild to moderate change of emphysema. The
heart size is enlarged. Previous CABG. Aortic atherosclerosis.

ABDOMEN/PELVIS: No abnormal hypermetabolic activity within the
liver, pancreas, adrenal glands, or spleen. No hypermetabolic lymph
nodes in the abdomen or pelvis.

Incidental CT findings: Nodule in left adrenal gland measures 1.9 cm
and -8.79 HU compatible with a benign adenoma. Normal right adrenal
gland. Aortic atherosclerosis. Infrarenal abdominal aortic aneurysm
measures 3.4 cm, image 123/4.

SKELETON: No focal hypermetabolic activity to suggest skeletal
metastasis.

Incidental CT findings: There is a irregular sclerotic focus within
the left iliac bone measuring 1.6 cm without significant FDG uptake.
IMPRESSION: 1. The masslike area of consolidation and architectural distortion
within the posterior left lower lobe appears slightly decreased in
size from 04/24/2018 and exhibits mild FDG uptake. Findings are
favored to represent sequelae of inflammatory or infectious process.
Follow-up imaging in 3 months with repeat CT of the chest is advised
to assess interval change. If this does not resolve then consider
further evaluation with tissue sampling.
2. Stable 4 mm right upper lobe lung nodule which is too small to
reliably characterize by PET-CT. Non-contrast chest CT can be
considered in 12 months if patient is high-risk. This recommendation
follows the consensus statement: Guidelines for Management of
Incidental Pulmonary Nodules Detected on CT Images: From the
3. Left adrenal adenoma.
4. Aortic Atherosclerosis (JXTZA-0VQ.Q) and Emphysema (JXTZA-39G.Z).
5. Infrarenal abdominal aortic aneurysm measures 3.4 cm. Recommend
followup by ultrasound in 3 years. This recommendation follows ACR
consensus guidelines: White Paper of the ACR Incidental Findings
6. These results will be called to the ordering clinician or
representative by the Radiologist Assistant, and communication
documented in the PACS or zVision Dashboard.

## 2019-12-27 IMAGING — DX DG CHEST 1V PORT
1 series · 1 of 1 positions shown · non-contrast
Comparison: 05/19/2018 chest radiograph.

CLINICAL DATA: 82 y/o M; shortness of breath, hypoxia, history of
CABG. Former smoker.

EXAM:
PORTABLE CHEST 1 VIEW

[chest ap]
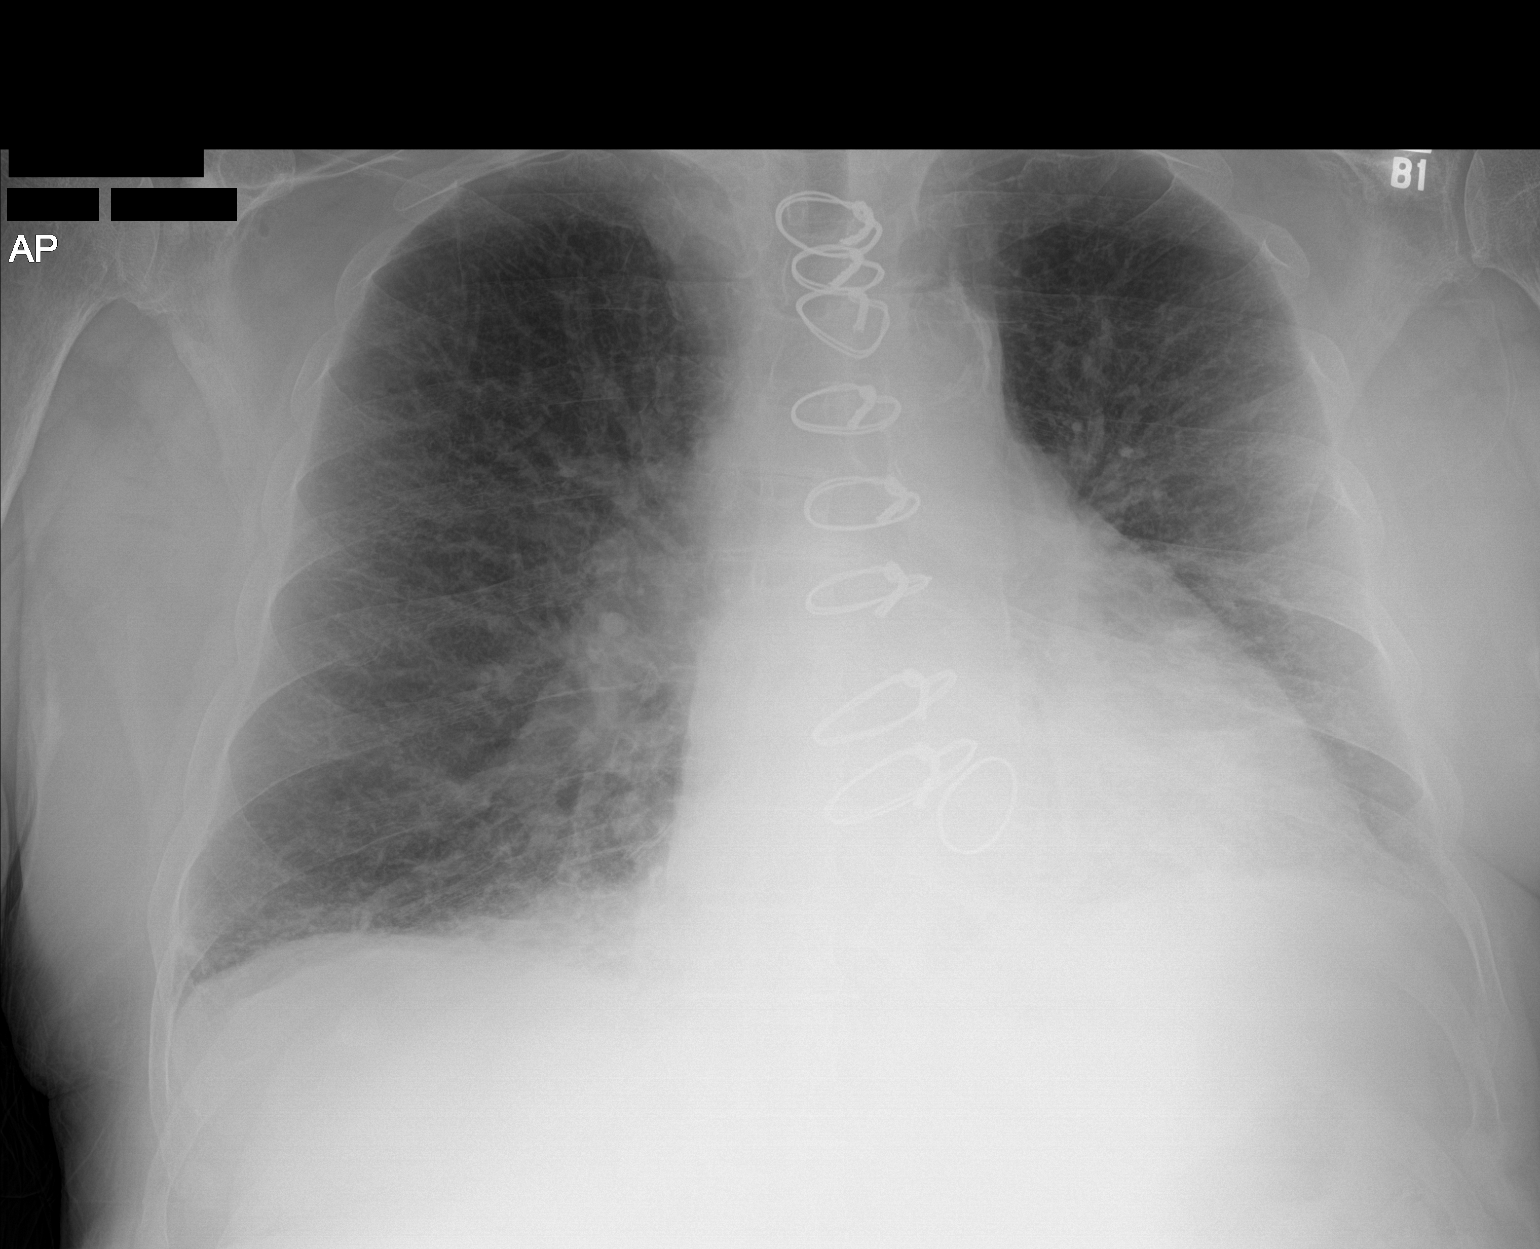

[1 of 1 positions shown; findings below may reference images not displayed]

FINDINGS: Stable cardiomegaly given projection and technique. Post median
sternotomy with wires in alignment. Mitral valve annuloplasty.
Calcific aortic atherosclerosis. Diffuse reticular opacities of the
lungs. Possible small left effusion. No acute osseous abnormality is
evident.
IMPRESSION: 1. Interstitial pulmonary edema and possible small left effusion.
2. Cardiomegaly.  Aortic Atherosclerosis ([id number]).
